# Patient Record
Sex: Male | Born: 1955
Health system: Southern US, Community
[De-identification: ages and names within clinical notes are randomized; demographics above are authoritative.]

## PROBLEM LIST (undated history)

## (undated) ENCOUNTER — Emergency Department (HOSPITAL_COMMUNITY): Disposition: A | Discharge status: Home / Self Care | Payer: Self-pay

## (undated) DIAGNOSIS — I10 Essential (primary) hypertension: Secondary | ICD-10-CM

## (undated) DIAGNOSIS — G8929 Other chronic pain: Secondary | ICD-10-CM

## (undated) DIAGNOSIS — I447 Left bundle-branch block, unspecified: Secondary | ICD-10-CM

## (undated) DIAGNOSIS — Z95810 Presence of automatic (implantable) cardiac defibrillator: Secondary | ICD-10-CM

## (undated) DIAGNOSIS — M1990 Unspecified osteoarthritis, unspecified site: Secondary | ICD-10-CM

## (undated) DIAGNOSIS — I2510 Atherosclerotic heart disease of native coronary artery without angina pectoris: Secondary | ICD-10-CM

## (undated) DIAGNOSIS — I219 Acute myocardial infarction, unspecified: Secondary | ICD-10-CM

## (undated) DIAGNOSIS — I8390 Asymptomatic varicose veins of unspecified lower extremity: Secondary | ICD-10-CM

## (undated) DIAGNOSIS — E7800 Pure hypercholesterolemia, unspecified: Secondary | ICD-10-CM

## (undated) DIAGNOSIS — Z8739 Personal history of other diseases of the musculoskeletal system and connective tissue: Secondary | ICD-10-CM

## (undated) DIAGNOSIS — J189 Pneumonia, unspecified organism: Secondary | ICD-10-CM

## (undated) DIAGNOSIS — Z955 Presence of coronary angioplasty implant and graft: Secondary | ICD-10-CM

## (undated) DIAGNOSIS — F419 Anxiety disorder, unspecified: Secondary | ICD-10-CM

## (undated) DIAGNOSIS — K219 Gastro-esophageal reflux disease without esophagitis: Secondary | ICD-10-CM

## (undated) DIAGNOSIS — I509 Heart failure, unspecified: Secondary | ICD-10-CM

## (undated) DIAGNOSIS — R7303 Prediabetes: Secondary | ICD-10-CM

## (undated) DIAGNOSIS — Z87442 Personal history of urinary calculi: Secondary | ICD-10-CM

## (undated) DIAGNOSIS — R0600 Dyspnea, unspecified: Secondary | ICD-10-CM

## (undated) HISTORY — DX: Anxiety disorder, unspecified: F41.9

## (undated) HISTORY — DX: Pure hypercholesterolemia, unspecified: E78.00

## (undated) HISTORY — DX: Acute myocardial infarction, unspecified: I21.9

## (undated) HISTORY — DX: Left bundle-branch block, unspecified: I44.7

## (undated) HISTORY — DX: Asymptomatic varicose veins of unspecified lower extremity: I83.90

## (undated) HISTORY — PX: BACK SURGERY: SHX140

## (undated) HISTORY — DX: Other chronic pain: G89.29

## (undated) HISTORY — DX: Presence of coronary angioplasty implant and graft: Z95.5

## (undated) HISTORY — DX: Essential (primary) hypertension: I10

## (undated) HISTORY — PX: CORONARY ANGIOPLASTY WITH STENT PLACEMENT: SHX49

## (undated) HISTORY — PX: CORONARY ANGIOPLASTY: SHX604

## (undated) HISTORY — DX: Unspecified osteoarthritis, unspecified site: M19.90

---

## 1991-01-06 DIAGNOSIS — I219 Acute myocardial infarction, unspecified: Secondary | ICD-10-CM

## 1991-01-06 HISTORY — DX: Acute myocardial infarction, unspecified: I21.9

## 2000-08-22 ENCOUNTER — Encounter: Payer: Self-pay | Admitting: Family Medicine

## 2000-08-22 ENCOUNTER — Ambulatory Visit (HOSPITAL_COMMUNITY): Admission: RE | Admit: 2000-08-22 | Discharge: 2000-08-22 | Payer: Self-pay | Admitting: *Deleted

## 2001-09-11 ENCOUNTER — Inpatient Hospital Stay (HOSPITAL_COMMUNITY): Admission: EM | Admit: 2001-09-11 | Discharge: 2001-09-13 | Payer: Self-pay | Admitting: Emergency Medicine

## 2001-09-11 ENCOUNTER — Encounter: Payer: Self-pay | Admitting: Emergency Medicine

## 2001-09-12 ENCOUNTER — Encounter: Payer: Self-pay | Admitting: Cardiology

## 2004-02-06 ENCOUNTER — Encounter: Admission: RE | Admit: 2004-02-06 | Discharge: 2004-02-06 | Payer: Self-pay | Admitting: Internal Medicine

## 2004-04-15 ENCOUNTER — Encounter: Admission: RE | Admit: 2004-04-15 | Discharge: 2004-04-15 | Payer: Self-pay | Admitting: Orthopedic Surgery

## 2004-04-30 ENCOUNTER — Encounter: Admission: RE | Admit: 2004-04-30 | Discharge: 2004-04-30 | Payer: Self-pay | Admitting: Orthopedic Surgery

## 2004-05-13 ENCOUNTER — Encounter: Admission: RE | Admit: 2004-05-13 | Discharge: 2004-05-13 | Payer: Self-pay | Admitting: Orthopedic Surgery

## 2006-12-17 ENCOUNTER — Ambulatory Visit (HOSPITAL_COMMUNITY): Admission: RE | Admit: 2006-12-17 | Discharge: 2006-12-17 | Payer: Self-pay | Admitting: Cardiology

## 2007-01-06 HISTORY — PX: CARDIAC CATHETERIZATION: SHX172

## 2007-04-01 ENCOUNTER — Observation Stay (HOSPITAL_COMMUNITY): Admission: EM | Admit: 2007-04-01 | Discharge: 2007-04-02 | Payer: Self-pay | Admitting: Emergency Medicine

## 2007-04-06 ENCOUNTER — Observation Stay (HOSPITAL_COMMUNITY): Admission: AD | Admit: 2007-04-06 | Discharge: 2007-04-07 | Payer: Self-pay | Admitting: Cardiology

## 2010-02-21 ENCOUNTER — Ambulatory Visit (INDEPENDENT_AMBULATORY_CARE_PROVIDER_SITE_OTHER): Payer: BC Managed Care – PPO | Admitting: Cardiology

## 2010-02-21 DIAGNOSIS — I2510 Atherosclerotic heart disease of native coronary artery without angina pectoris: Secondary | ICD-10-CM

## 2010-02-21 DIAGNOSIS — E7800 Pure hypercholesterolemia, unspecified: Secondary | ICD-10-CM

## 2010-02-21 DIAGNOSIS — I10 Essential (primary) hypertension: Secondary | ICD-10-CM

## 2010-03-04 ENCOUNTER — Telehealth (INDEPENDENT_AMBULATORY_CARE_PROVIDER_SITE_OTHER): Payer: Self-pay | Admitting: *Deleted

## 2010-03-05 ENCOUNTER — Ambulatory Visit (HOSPITAL_COMMUNITY): Payer: BC Managed Care – PPO | Attending: Cardiology

## 2010-03-05 ENCOUNTER — Encounter: Payer: Self-pay | Admitting: Cardiology

## 2010-03-05 DIAGNOSIS — Z09 Encounter for follow-up examination after completed treatment for conditions other than malignant neoplasm: Secondary | ICD-10-CM | POA: Insufficient documentation

## 2010-03-05 DIAGNOSIS — I2510 Atherosclerotic heart disease of native coronary artery without angina pectoris: Secondary | ICD-10-CM

## 2010-03-05 DIAGNOSIS — I200 Unstable angina: Secondary | ICD-10-CM | POA: Insufficient documentation

## 2010-03-05 DIAGNOSIS — I447 Left bundle-branch block, unspecified: Secondary | ICD-10-CM

## 2010-03-13 NOTE — Assessment & Plan Note (Signed)
Summary: Cardiology Nuclear Testing  Nuclear Med Background Indications for Stress Test: Evaluation for Ischemia, Stent Patency   History: Abnormal EKG, Angioplasty, Heart Catheterization, Myocardial Infarction, Myocardial Perfusion Study, Stents  History Comments: 1993 Angioplasty 1993-2009 MI  2003-2009 STENTS:RCA 2008 MPS Inf. wall ischemia EF50%  Symptoms: Palpitations    Nuclear Pre-Procedure Cardiac Risk Factors: Family History - CAD, History of Smoking, Hypertension, LBBB Caffeine/Decaff Intake: None NPO After: 7:30 AM Lungs: clear IV 0.9% NS with Angio Cath: 18g     IV Site: R Antecubital IV Started by: Stanton Kidney, EMT-P Chest Size (in) 44     Height (in): 75 Weight (lb): 218 BMI: 27.35  Nuclear Med Study 1 or 2 day study:  1 day     Stress Test Type:  Adenosine Reading MD:  Peter Swaziland, MD     Referring MD:  P.Jordan Resting Radionuclide:  Technetium 60m Tetrofosmin     Resting Radionuclide Dose:  10.7 mCi  Stress Radionuclide:  Technetium 33m Tetrofosmin     Stress Radionuclide Dose:  33.0 mCi   Stress Protocol  Max Systolic BP: 111 mm HgDose of Adenosine:  55.5 mg    Stress Test Technologist:  Milana Na, EMT-P     Nuclear Technologist:  Domenic Polite, CNMT  Rest Procedure  Myocardial perfusion imaging was performed at rest 45 minutes following the intravenous administration of Technetium 26m Tetrofosmin.  Stress Procedure  The patient received IV adenosine at 140 mcg/kg/min for 4 minutes. 2nd degree avb with infusion. There were no significant changes with infusion. Technetium 24m Tetrofosmin was injected at the 2 minute mark and quantitative spect images were obtained after a 45 minute delay.  QPS Raw Data Images:  Normal; no motion artifact; normal heart/lung ratio. Stress Images:  Basal to mid inferior perfusion defect Rest Images:  Basal to mid inferior perfusion defect, less marked than with stress Subtraction (SDS):  Partially reversible  basal to mid inferior perfusion defect.  Transient Ischemic Dilatation:  1.02  (Normal <1.22)  Lung/Heart Ratio:  .28  (Normal <0.45)  Quantitative Gated Spect Images QGS EDV:  203 ml QGS ESV:  113 ml QGS EF:  44 % QGS cine images:  Inferior hypokinesis   Overall Impression  Exercise Capacity: Adenosine study with no exercise. BP Response: Normal blood pressure response. Clinical Symptoms: Flushed, chest pain.  ECG Impression: NSR with LBBB Overall Impression: Partially reversible basal to mid inferior perfusion defect suggesting infarction with component of peri-infarction ischemia.  Overall Impression Comments: Mildly decreased LV systolic function with inferior hypokinesis.   Appended Document: Cardiology Nuclear Testing copy sent to Dr. Swaziland

## 2010-03-13 NOTE — Progress Notes (Signed)
Summary: Nuclear Pre-Procedure  Phone Note Outgoing Call   Call placed by: Milana Na, EMT-P,  March 04, 2010 3:42 PM Summary of Call: Reviewed information on [employer] Information Sheet (see scanned document for further details).  Spoke with patient.     Nuclear Med Background Indications for Stress Test: Evaluation for Ischemia, Stent Patency   History: Angioplasty, Heart Catheterization, Myocardial Infarction, Myocardial Perfusion Study, Stents  History Comments: 1993 Angioplasty 1993-2009 MI  2003-2009 STENTS:RCA 2008 MPS Inf. wall ischemia EF50%     Nuclear Pre-Procedure Cardiac Risk Factors: Family History - CAD, History of Smoking, Hypertension, LBBB  Nuclear Med Study Referring MD:  P.Jordan   b

## 2010-04-30 ENCOUNTER — Other Ambulatory Visit: Payer: Self-pay | Admitting: *Deleted

## 2010-04-30 DIAGNOSIS — Z79899 Other long term (current) drug therapy: Secondary | ICD-10-CM

## 2010-05-07 ENCOUNTER — Encounter: Payer: Self-pay | Admitting: Cardiology

## 2010-05-07 DIAGNOSIS — E7800 Pure hypercholesterolemia, unspecified: Secondary | ICD-10-CM | POA: Insufficient documentation

## 2010-05-07 DIAGNOSIS — I447 Left bundle-branch block, unspecified: Secondary | ICD-10-CM | POA: Insufficient documentation

## 2010-05-07 DIAGNOSIS — I10 Essential (primary) hypertension: Secondary | ICD-10-CM | POA: Insufficient documentation

## 2010-05-07 DIAGNOSIS — I214 Non-ST elevation (NSTEMI) myocardial infarction: Secondary | ICD-10-CM | POA: Insufficient documentation

## 2010-05-14 ENCOUNTER — Ambulatory Visit (INDEPENDENT_AMBULATORY_CARE_PROVIDER_SITE_OTHER): Payer: BC Managed Care – PPO | Admitting: Cardiology

## 2010-05-14 ENCOUNTER — Other Ambulatory Visit (INDEPENDENT_AMBULATORY_CARE_PROVIDER_SITE_OTHER): Payer: BC Managed Care – PPO | Admitting: *Deleted

## 2010-05-14 ENCOUNTER — Encounter: Payer: Self-pay | Admitting: Cardiology

## 2010-05-14 DIAGNOSIS — I10 Essential (primary) hypertension: Secondary | ICD-10-CM

## 2010-05-14 DIAGNOSIS — I2510 Atherosclerotic heart disease of native coronary artery without angina pectoris: Secondary | ICD-10-CM | POA: Insufficient documentation

## 2010-05-14 DIAGNOSIS — Z79899 Other long term (current) drug therapy: Secondary | ICD-10-CM

## 2010-05-14 DIAGNOSIS — E7800 Pure hypercholesterolemia, unspecified: Secondary | ICD-10-CM

## 2010-05-14 LAB — BASIC METABOLIC PANEL
BUN: 22 mg/dL (ref 6–23)
CO2: 28 mEq/L (ref 19–32)
Calcium: 9.6 mg/dL (ref 8.4–10.5)
Chloride: 103 mEq/L (ref 96–112)
Creatinine, Ser: 1.1 mg/dL (ref 0.4–1.5)
GFR: 74.37 mL/min (ref >60.00)
Glucose, Bld: 86 mg/dL (ref 70–99)
Potassium: 4.4 mEq/L (ref 3.5–5.1)
Sodium: 138 mEq/L (ref 135–145)

## 2010-05-14 NOTE — Patient Instructions (Signed)
Continue current medications.  Continue with diet and exercise.  We will call with results of your lab work today.  I will see you back in 6 months with fasting lab.

## 2010-05-14 NOTE — Assessment & Plan Note (Signed)
He remains asymptomatic. His most recent nuclear stress test in March showed a partially reversible basal to mid inferior wall defect consistent with infarction and peri-infarct ischemia. Ejection fraction was 44%. This is unchanged from 2008. We will continue to stress risk factor modification.

## 2010-05-14 NOTE — Assessment & Plan Note (Signed)
Blood pressure control has improved significantly. We will continue with his ACE inhibitor.

## 2010-05-14 NOTE — Progress Notes (Signed)
   [name] Date of Birth: 15-Oct-1955   History of Present Illness: [name] is seen today for followup evaluation. He has been doing much better with his diet and has lost 5 pounds. He reports his blood pressure readings at home have been good with systolic readings between 126 and 132. He has had no significant chest pain or shortness of breath.  Current Outpatient Prescriptions on File Prior to Visit  Medication Sig Dispense Refill  . amLODipine (NORVASC) 5 MG tablet Take 5 mg by mouth daily.        Marland Kitchen aspirin 81 MG tablet Take 81 mg by mouth daily.        . fenofibrate (TRICOR) 145 MG tablet Take 145 mg by mouth daily.        . Flaxseed, Linseed, (FLAX SEED OIL PO) Take by mouth 3 (three) times daily.        . Misc Natural Products (MELLOW-TONE PO) Take by mouth 3 (three) times daily. 2 TID       . Multiple Vitamin (MULTIVITAMIN) tablet Take 1 tablet by mouth 2 (two) times daily.        . ramipril (ALTACE) 2.5 MG tablet Take 2.5 mg by mouth daily.          Allergies  Allergen Reactions  . Ezetimibe   . Niacin   . Statins     Past Medical History  Diagnosis Date  . Hypertension   . Hypercholesterolemia   . LBBB (left bundle branch block)   . MI (myocardial infarction) 1993    LATERAL  . Varicose veins   . Arthritis     Past Surgical History  Procedure Date  . Coronary angioplasty     DIRECT ANGIOPLASTY THE MARGINAL BRANCH    History  Smoking status  . Former Smoker  . Quit date: 05/06/2005  Smokeless tobacco  . Not on file    History  Alcohol Use No    Family History  Problem Relation Age of Onset  . Heart attack Father   . Heart disease Brother     Review of Systems:   All other systems were reviewed and are negative.  Physical Exam: BP 130/84  Pulse 72  Ht 6\' 3"  (1.905 m)  Wt 217 lb 8 oz (98.657 kg)  BMI 27.19 kg/m2 He is a well-developed white male in no acute distress. He has no JVD or bruits. Lungs are clear. Cardiac exam reveals a  regular rate and rhythm without gallop, murmur, or click. Abdomen is soft and nontender. He has no masses or bruits. Femoral and pedal pulses are 2+. LABORATORY DATA:   Assessment / Plan:

## 2010-05-14 NOTE — Assessment & Plan Note (Signed)
His last lipid panel did show some improvement with an LDL of 115, triglycerides of 98, and HDL 42. He has been intolerant of statins, Zetia, and niacin. We will continue with fenofibrate. Repeat fasting lab work in 6 months.

## 2010-05-15 ENCOUNTER — Telehealth: Payer: Self-pay | Admitting: *Deleted

## 2010-05-15 NOTE — Telephone Encounter (Signed)
Message copied by Murrell Redden on Thu May 15, 2010  5:21 PM ------      Message from: [place], PETER      Created: Wed May 14, 2010  1:44 PM       BMET is normal. Please report.

## 2010-05-15 NOTE — Telephone Encounter (Signed)
Notified of lab results. 

## 2010-05-20 NOTE — Discharge Summary (Signed)
NAMEWAVERLY, [name]                 ACCOUNT NO.:  [id number]   MEDICAL RECORD NO.:  [id number]          PATIENT TYPE:  INP   LOCATION:  4729                         FACILITY:  [facility]   PHYSICIAN:  Colleen Can. Deborah Chalk, M.D.DATE OF BIRTH:  [birth year]   DATE OF ADMISSION:  04/06/2007  DATE OF DISCHARGE:  04/07/2007                               DISCHARGE SUMMARY   DISCHARGE DIAGNOSES:  1. Chest pain with subsequent elective cardiac catheterization      documenting stent patency to the right coronary artery with a 30%      proximal irregularity, distally the posterolateral branch has a      small 70-80% ostial narrowing; posterior descending has a 50%      ostial narrowing, and there is some haziness in the distal right      coronary, but overall it was felt to be patent.  The left main is      normal, the left anterior descending is normal, and the proximal      left anterior descending dips into the septum and then trifurcates.      The intermediate coronary is relatively a large bifurcating vessel      and it is normal.  The left circumflex is small with minor      irregularities.  Left ventricular function is normal.  2. Known ischemic heart disease with remote lateral myocardial      infarction in 1993, treated with direct angioplasty of the marginal      branch.  He has had stenting of the distal right coronary in 2003.      He had a non-Q-wave myocardial infarction 1 week ago and was found      to have a high-grade stenosis in the posterolateral branch of the      right coronary and an ulcerated plaque in the mid right coronary      with moderate stenosis, and at that time had extensive stenting of      the mid right with two 3.0 x 16 mm Taxus stents, and stenting of      the posterolateral branch with a 2.5 x 12 mm Taxus stent.  3. Hyperlipidemia.  4. Hypertension.  5. Venous varicosities.  6. Remote tobacco abuse.   HISTORY OF PRESENT ILLNESS:  [name] is a 55 year old white  male who  was referred for admission due to recurrent chest pain.  He had a non-Q-  wave myocardial infarction 1 week ago and had 6 extensive stenting of  the right coronary artery.  He was discharged the following day.  He  initially did well.  However, today, on the day of admission, he went to  see a chiropractor and had acupuncture on his back, and while lying  down, he developed acute substernal chest pain, it radiated to his upper  jaw.  This was associated with a clammy sensation.  He graded it as a 5  on a scale of 0-10.  This discomfort lasted for approximately 15 minutes  and then resolved and then subsequently recurred 15-20 minutes later.  Second episode lasted  approximately 20 minutes.  Since his discharge, he  had had a mild nagging discomfort in the left lateral chest.  His EKG  shows a left bundle-branch block.  He was subsequently seen in the  office, and because of recurrent anginal symptoms and recent extensive  stenting of the right coronary, he was admitted for stabilization and  repeat cardiac catheterization.   Please see the history and physical per Dr. Peter Swaziland for further  patient presentation and profile.   LABORATORY DATA:  On admission, his CBC was normal.  PT and PTT were  unremarkable.  BMET was normal.  Cardiac enzymes and CK-MBs were  negative.  Troponin was 1.24 to 0.72; however, these were felt to be  trending down from the recent event.  His EKG showed a left bundle-  branch block.   HOSPITAL COURSE:  The patient was admitted from the office.  He was  started on IV nitroglycerin and IV heparin.  We proceeded on with  cardiac catheterization that afternoon.  The procedure was tolerated  well without any known complications.  The findings are as noted above,  and overall in light of the findings, Mr. Blethen can be continued on  nitrate and managed medically.  His antiplatelet therapy will be  continued as well, and he will have close outpatient  followup.  He was  watched overnight, and on April 07, 2007, was deemed stable for  discharge.   CONDITION ON DISCHARGE:  Stable.   DISCHARGE DIET:  Low-salt, heart-healthy.   DISCHARGE MEDICATIONS:  1. Aspirin 81 mg a day.  2. Norvasc 5 mg a day.  3. Multivitamin daily.  4. Fish oil 1000 mg b.i.d.  5. Glucosamine twice a day.  6. Flax seed oil twice a day.  7. Tricor 145 mg a day.  8. Plavix 75 mg a day.  9. Nitroglycerin p.r.n.  10.Pepcid 40 mg at bedtime.  11.Nitro-Dur patch 0.4 mg to be placed every evening at bedtime and      removed the following day at dinner time.   He is to see Dr. Peter Swaziland in 1 week, certainly sooner if any  problems arise in the interim.      [name], N.P.      Colleen Can. Deborah Chalk, M.D.  Electronically Signed    LC/MEDQ  D:  04/07/2007  T:  04/08/2007  Job:  [id number]   cc:   Peter M. [place], M.D.

## 2010-05-20 NOTE — H&P (Signed)
NAMEJOSEMARIA, BRINING NO.:  [id number]   MEDICAL RECORD NO.:  [id number]          PATIENT TYPE:  INP   LOCATION:  4729                         FACILITY:  [facility]   PHYSICIAN:  Peter M. Swaziland, M.D.  DATE OF BIRTH:  04/01/1955   DATE OF ADMISSION:  04/06/2007  DATE OF DISCHARGE:                              HISTORY & PHYSICAL   Mr. Isbell is a 55 year old white male with known history of coronary  artery disease.  He had a remote lateral myocardial infarction in 1993  treated with direct angioplasty of the marginal branch.  He had stenting  of the distal right coronary artery in 2003.  He presented last week  with a non-Q-wave myocardial infarction.  He was found to have a high-  grade stenosis in the posterolateral branch of the right coronary and an  ulcerated plaque in the mid-right coronary with moderate stenosis.  He  had extensive stenting of the mid-right coronary with two 3.0 x 16-mm  Taxus stents and stenting of the posterolateral branch with a 2.5 x 12-  mm Taxus stent.  He was discharged on Saturday.  This morning he went to  see is chiropractor and had acupuncture on his back.  On lying down he  developed acute substernal chest pain radiating to his upper jaw  associated with a clammy sensation and he rated this as a 5/10.  Symptoms last 15 minutes and then abated and then about 15-20 minutes  later it recurred, again with similar symptoms and this time lasted  about 20 minutes.  He states since discharge he has had a mild nagging  pain in his left lateral chest.  The patient's ECG shows a left bundle  branch block.  Because of his recurrent anginal symptoms and recent  extensive stenting of the right coronary artery, the patient is admitted  for stabilization and repeat cardiac catheterization.   PAST MEDICAL HISTORY:  1. Hypercholesterolemia.  2. Hypertension.  3. History of venous varicosities.  4. Remote history of tobacco use.  5. History of  coronary disease as noted above.   CURRENT MEDICATIONS:  1. Aspirin 81 mg per day.  2. Norvasc 5 mg per day.  3. Multivitamin daily.  4. Fish oil 1000 mg b.i.d.  5. Glucosamine twice a day.  6. Flaxseed old twice a day.  7. Tricor 145 mg per day.  8. Plavix 75 mg per day.   ALLERGIES:  The patient is intolerant to statins Niaspan and Zetia.   His family and social history are unchanged from recent H&P.   REVIEW OF SYSTEMS:  Otherwise unremarkable.   PHYSICAL EXAMINATION:  The patient is a well-developed white male in no  apparent distress.  Weight is 238.  Blood pressure is 150/90, pulse 80 and regular.  HEENT:  Unremarkable.  He has no JVD or bruits.  LUNGS:  Clear.  CARDIAC:  Without gallop, murmur, rub or click.  ABDOMEN:  Soft, nontender.  He has good pedal pulses.  There is no groin  hematoma.  He has no edema or phlebitis.  NEUROLOGIC:  Intact.  His ECG shows normal sinus rhythm with a left bundle branch block, which  is unchanged   IMPRESSION:  1. Recurrent chest pain consistent with unstable angina.  2. Recent non-Q-wave myocardial infarction with subsequent stenting of      the right coronary in the posterolateral branch.  3. Hypertension.  4. Hypercholesterolemia.   PLAN:  The patient will be admitted.  He will be started on IV heparin  and nitroglycerin and will undergo repeat cardiac catheterization later  today.           ______________________________  Peter M. Sw[place]iland, M.D.     PMJ/MEDQ  D:  04/06/2007  T:  04/06/2007  Job:  [id number]

## 2010-05-20 NOTE — Cardiovascular Report (Signed)
NAMEJESPER, [name] NO.:  [id number]   MEDICAL RECORD NO.:  [id number]          PATIENT TYPE:  INP   LOCATION:  3715                         FACILITY:  [facility]   PHYSICIAN:  Peter M. Swaziland, M.D.  DATE OF BIRTH:  12-Jan-1955   DATE OF PROCEDURE:  04/01/2007  DATE OF DISCHARGE:  04/02/2007                            CARDIAC CATHETERIZATION   INDICATION FOR PROCEDURE:  The patient is a 55 year old white male with  history of coronary artery disease.  He has had remote angioplasty of a  marginal branch in 1993.  He subsequently had stenting of the distal  right coronary artery in 2003.  He had a cardiac catheterization  December 2008 which showed diffuse nonobstructive coronary disease.  He  now presents with non-Q-wave myocardial infarction.   PROCEDURE:  Left heart catheterization, coronary and left ventricular  angiography, intracoronary stenting of the mid right coronary and the  posterolateral branch.  Access is via the right femoral artery using the  standard Seldinger technique.  Postprocedure his groin was closed using  Angio-Seal device with excellent hemostasis.   EQUIPMENT USED:  6-French 4 cm right and left Judkins catheter, 6-French  pigtail catheter, 6-French arterial sheath, 6-French FR-4 with side  holes 0.0 and 4, Luge wire, a 2.5 x 12-mm Maverick balloon, a 2.5 x 12  mm Taxus Liberte stent, a 2.75 x 8-mm Quantum Maverick balloon, a 3.0 x  16 mm Taxus Liberte stents x2, and a 3.0 x 20-mm Quantum Maverick  balloon   MEDICATIONS:  1. Local anesthesia with 1% lidocaine.  2. 75 mcg of IV fentanyl.  3. 4 mg of IV Versed.  4. Nitroglycerin 200 mcg intracoronary x1.  5. Angiomax bolus and continuous infusion per pharmacy.  ACT was 331      seconds.  6. Contrast 225 mL of Omnipaque.   HEMODYNAMIC DATA:  Aortic pressure is 109/68 with a mean of 85 mmHg.  Left ventricle pressure is 109 with EDP of 12 mmHg.   ANGIOGRAPHIC DATA:  Left coronary arises  and distributes normally.  The  left main coronary is mildly calcified but otherwise appears normal.   The left anterior descending artery has a tortuous segment in the  proximal vessel where it dips down and appears to go intramyocardial.  This segment is diffusely diseased up to 40% at the trifurcation of the  1st diagonal branch and the 1st septal perforator branch.  The LAD is  otherwise normal.   The left circumflex coronary artery demonstrates a 20% narrowing in the  1st obtuse marginal vessel.  Otherwise, there is no significant disease  in the circumflex system.   The right coronary artery arises and distributes normally.  It is a  dominant vessel.  There is a segment of 40-50% stenosis in the mid  vessel followed by an ulcerated plaque of 70% stenosis in the midvessel.  The distal vessel's site of previous stent is still widely patent.  There is a 95% stenosis in the proximal posterolateral branch.   Left ventricular angiography performed in the RAO view demonstrates  normal left ventricular  size.  There is inferior wall hypokinesia with  overall mild left ventricular systolic dysfunction.  Ejection fraction  is estimated at 50%.  There is no significant mitral insufficiency.   We proceeded at this point with intervention of the right coronary  artery.  We initially tried to cross lesions with an Asahi medium wire,  but this proved to be very difficult, and we were unable to cross past  the mid right coronary artery.  We switched to a Luge wire which did  give [place] access distally past the posterolateral branch lesion.  We  initially predilated the posterolateral branch lesion using 2.5 mm  Maverick balloon up to 6 atmospheres.  We then predilated the mid right  coronary artery up to 6 atmospheres as well.  We then stented the  posterolateral branch using a 2.5 x 12 mm Taxus Liberte stent, deploying  this at 6 and then 12 atmospheres.  We then postdilated this stent using  a  2.75 x 8-mm Quantum Maverick balloon, dilating to 14 atmospheres x2.  The mid right coronary was then addressed.  We used a 3.0 x 60 mm Taxus  Liberte stent to stent the area of ulceration. The stent was deployed in  overlapping fashion with the original stent distally.  It was deployed  at 8 and then 12 atmospheres with a stent balloon.  At this point, it  was clear that the more proximal lesion in the mid right coronary artery  had worsened, probably due to injury from the wire or balloon passage.  It was now at least 80% stenosed.  We stented this using an additional  3.0 x 16 mm Taxus Liberte stent, again deploying in 8 and then 12  atmospheres.  The entire mid right coronary was then postdilated using a  3.0 x 20-mm Quantum Maverick balloon doing 2 inflations up to 16  atmospheres to cover the entire stented segment.  This yielded an  excellent angiographic result with 0% residual stenosis in the mid right  coronary artery and in the posterolateral branch with TIMI grade 3 flow.  The patient tolerated the procedure well without complications.   FINAL INTERPRETATION:  1. Single-vessel obstructive atherosclerotic coronary artery disease.  2. Mild left ventricular dysfunction.  3. Successful intracoronary stenting of the mid and right coronary      artery and in the posterolateral branch of the right coronary      artery.           ______________________________  Peter M. [place], M.D.     PMJ/MEDQ  D:  04/01/2007  T:  04/02/2007  Job:  [id number]   cc:   Antony Madura, M.D.

## 2010-05-20 NOTE — H&P (Signed)
[name], [name] NO.:  [id number]   MEDICAL RECORD NO.:  [id number]           PATIENT TYPE:   LOCATION:                                 FACILITY:   PHYSICIAN:  Peter M. Swaziland, M.D.       DATE OF BIRTH:   DATE OF ADMISSION:  12/17/2006  DATE OF DISCHARGE:                              HISTORY & PHYSICAL   HISTORY OF PRESENT ILLNESS:  [name] is a 55 year old white male with  known history of coronary artery disease.  He had a remote lateral  myocardial infarction in 1993 and underwent angioplasty of a marginal  branch at that time. He returned in 2003 with recurrent unstable angina  and that time was found to have a complex lesion in the distal right  coronary.  This was successfully stented using a 3.0 x 16 mm Express  stent.  The patient has done well since then until recently he noticed  some atypical chest pain described as a sharp transient pain in his left  parasternal region. For this reason, he underwent an adenosine  Cardiolite study which demonstrated evidence of partially reversible  inferior wall defect consistent with ischemia.  His ejection fraction  was 50%. Based on these findings, we have recommended repeat coronary  angiography.   PAST MEDICAL HISTORY:  Is significant for:  1. Hypercholesterolemia.  2. Hypertension.  3. He has a history of venous varicosities.  4. He has prior history of tobacco abuse.   ALLERGIES:  He has been intolerant to STATINS  and to [facility] as well  as ZETIA.   CURRENT MEDICATIONS:  1. Aspirin 81 mg per day.  2. Norvasc 5 mg daily.  3. Multivitamin daily.  4. Fish old 1000 mg b.i.d.  5. Glucosamine twice a day.  6. Flaxseed old twice a day.  7. Tricor 145 mg per day.   SOCIAL HISTORY:  The patient quit smoking over 5 years ago.  He is  married and has three children.   FAMILY HISTORY:  Father died in his 27s of myocardial infarction.   REVIEW OF SYSTEMS:  Is otherwise unremarkable.   PHYSICAL  EXAMINATION:  GENERAL:  The patient is a pleasant white male in  no apparent distress.  VITAL SIGNS:  Weight 259. Blood pressure is 130/90, pulse 80 and  regular. Respirations are normal.  HEENT:  He is normocephalic, atraumatic.  Pupils are equal, round,  reactive to light and accommodation.  Extraocular movements are full.  Oropharynx is clear.  NECK:  Supple without JVD, adenopathy, thyromegaly or bruits.  LUNGS:  Clear to auscultation and percussion.  CARDIAC:  Exam reveals a regular rate and rhythm without gallop, murmur,  rub or click.  ABDOMEN:  Soft and nontender. He has no masses or bruits.  EXTREMITIES:  Femoral and pedal pulses are 2+ and symmetric.  NEUROLOGIC:  Exam is nonfocal.   LABORATORY DATA:  His ECG at rest shows normal sinus rhythm, left bundle  branch block.   His chest x-ray shows no active disease.   IMPRESSION:  1. Coronary disease with  remote lateral myocardial infarction, now      with an abnormal adenosine Cardiolite study showing evidence of      inferior wall ischemia.  2. Prior angioplasty of the first obtuse marginal vessel in 1993 and      prior stenting of the right coronary artery with an Express stent      in 2003  3. Hypercholesterolemia.  4. Hypertension.   PLAN:  Will proceed with diagnostic cardiac catheterization with further  therapy pending these results.           ______________________________  Peter M. [place], M.D.     PMJ/MEDQ  D:  12/13/2006  T:  12/13/2006  Job:  [id number]   cc:   Antony Madura, M.D.

## 2010-05-20 NOTE — Cardiovascular Report (Signed)
[name], [name] NO.:  [id number]   MEDICAL RECORD NO.:  [id number]          PATIENT TYPE:  OIB   LOCATION:  2899                         FACILITY:  [facility]   PHYSICIAN:  [name], M.D.  DATE OF BIRTH:  25-May-1955   DATE OF PROCEDURE:  DATE OF DISCHARGE:                            CARDIAC CATHETERIZATION   INDICATION FOR PROCEDURE:  The patient is a 55 year old white male with  history of coronary disease status post prior stenting of the distal  right coronary artery in 2003, who presents with atypical chest pain.  Cardiolite study is suggestive of some inferior wall ischemia.   PROCEDURES:  Left heart catheterization, coronary left ventricular  angiography.  Equipment used  6-French 4 cm right and left Judkins catheter, 6-French pigtail  catheter, 6-French arterial sheath.   MEDICATIONS:  Local anesthesia 1% Xylocaine; Versed 2 mg IV; fentanyl 25  mcg IV; contrast 110 cc of Omnipaque.   HEMODYNAMIC DATA:  Aortic pressure 120/62 with a mean of 87 mmHg.  Left  ventricular pressure was 120 with EDP of 5 mmHg.   ANGIOGRAPHIC DATA:  The left coronary artery arises and distributes  normally.  Left main coronary is calcified without obstructive disease.   The left anterior descending is also moderately calcified proximally.  There is 10-20% narrowing in the proximal vessel.   The left circumflex coronary artery gives rise to a large first obtuse  marginal vessel.  There is 20% narrowing in the proximal obtuse marginal  vessel.   The right coronary artery is a dominant vessel.  It has a 50% stenosis  in the midvessel.  There is 30% stenosis at the crux.  The distal vessel  at the site of previous stent is widely patent.   Left ventricular angiography was performed in the RAO view.  This  demonstrates normal left ventricular size and function.  Overall  ejection fraction of 55%.  No wall motion abnormalities were seen.   FINAL INTERPRETATION:  1.  Nonobstructive atherosclerotic coronary artery disease.  2. Normal left ventricular function.  3. Continued patency of the stent in the distal right coronary.   PLAN:  Would recommend continued medical management.   ADDENDUM:  The patient's right groin was closed using Angio-Seal device  with excellent hemostasis.           ______________________________  [name], M.D.     PMJ/MEDQ  D:  12/17/2006  T:  12/18/2006  Job:  [id number]   cc:   [name], M.D.

## 2010-05-20 NOTE — Cardiovascular Report (Signed)
NAMEPADRAIG, [name]                 ACCOUNT NO.:  [id number]   MEDICAL RECORD NO.:  [id number]          PATIENT TYPE:  INP   LOCATION:  4729                         FACILITY:  [facility]   PHYSICIAN:  Colleen Can. Deborah Chalk, M.D.DATE OF BIRTH:  [birth year]   DATE OF PROCEDURE:  04/06/2007  DATE OF DISCHARGE:                            CARDIAC CATHETERIZATION   PROCEDURE:  Left heart catheterization with selective coronary  angiography, left ventricular angiography.   TYPE AND SITE OF ENTRY:  Percutaneous right femoral artery with Angio-  Seal.   CATHETERS:  6-French four curved Judkins right and left coronary  catheter, 6-French pigtail ventriculographic catheter.   CONTRAST:  Pure Omnipaque.   MEDICATIONS GIVEN PRIOR TO PROCEDURE:  Valium 10 mg p.o.   MEDICATIONS GIVEN DURING PROCEDURE:  Versed 2 mg IV.   COMMENTS:  The patient tolerated the procedure well.  Ancef was given  because of the Angio-Seal procedure.   HEMODYNAMIC DATA:  The aortic pressure was 116/72, LV was all 103/1-3.  There was no aortic valve gradient noted on pullback.   ANGIOGRAPHIC FINDINGS:  Left ventricular angiogram was performed in the  RAO position.  Overall cardiac size was borderline increased.  The  global ejection fraction was estimated in the 50-55% range.  The  inferior wall moved well.  There may have been some slight hypokinesis  in part related to left bundle branch block.   CORONARY ARTERIES:  The coronary arteries arise and distribute normally.   1. Right coronary artery.  The right coronary artery is very large.      All the stents that were placed in March of this year were patent.      There was a 30% proximal irregularity.  Distally the posterolateral      branch was small had a 70-80% ostial narrowing.  The posterior      descending had a 50% ostial narrowing.  There is some haziness in      the distal right coronary artery but overall was felt to be patent.  2. Left main coronary artery is  normal.  3. Left anterior descending is normal.  The proximal left anterior      descending dips into the septum and then trifurcates.  4. Intermediate coronary is relatively large bifurcating vessel.  It      is normal.  5. Left circumflex is small with minor irregularities.   OVERALL IMPRESSION:  1. Essentially normal global left ventricular function.  2. Patent stents in the right coronary artery with moderate disease in      small vessels.  3. Minimal coronary atherosclerosis in the left coronary system.   DISCUSSION:  In light of these findings, we will continue [name] on  nitrates and manage him medically.  We will continue his antiplatelet  therapy.      Colleen Can. Deborah Chalk, M.D.  Electronically Signed     SNT/MEDQ  D:  04/06/2007  T:  04/07/2007  Job:  [id number]

## 2010-05-23 NOTE — H&P (Signed)
NAMEDONALDSON, RICHTER NO.:  [id number]   MEDICAL RECORD NO.:  [id number]                   PATIENT TYPE:  INP   LOCATION:  6522                                 FACILITY:  [facility]   PHYSICIAN:  Francisca December, M.D.               DATE OF BIRTH:  28-Dec-1955   DATE OF ADMISSION:  09/11/2001  DATE OF DISCHARGE:                                HISTORY & PHYSICAL   REASON FOR ADMISSION:  Chest pain.   HISTORY OF PRESENT ILLNESS:  The patient is a 55 year old man who developed  mild brief left anterior chest pain that recurred intermittently yesterday  morning.  It resolved later in the day.  This evening around 1800 hours it  recurred.  He had had three vodka cocktails.  This time it was more severe.  It would come and go and was associated with marked diaphoresis.  There was  some shortness of breath as well.  The pain radiated to the inside of his  left arm.  It finally resolved after IV nitroglycerin was administered in  the emergency room at around 2000 hours.  At the time of my evaluation at  2200 hours he feels a little bit better but somewhat sore.   He has a history of coronary disease status post an angioplasty in 1993 for  an acute lateral wall myocardial infarction.  Presumably a left circumflex  coronary artery.  He also had a presentation for chest pain in 1997 and  underwent cardiac catheterization which showed no significant obstructive  disease.   PAST MEDICAL HISTORY:  Other than mentioned above is negative.   PAST SURGICAL HISTORY:  Negative.   CURRENT MEDICATIONS:  Aspirin 325 mg daily and vitamins.   ALLERGIES:  None known.   FAMILY HISTORY:  Markedly positive for coronary disease.  He has a younger  brother who has had bypass surgery and his father died at age 37 of  myocardial infarction.   SOCIAL HISTORY:  He uses ethanol only occasionally by his report.  He smokes  two to three packs of cigarettes a day.  He works in [employer]  and is accompanied by his wife and son here in the emergency room tonight.   REVIEW OF SYSTEMS:  Negative for visual changes or headache.  No dysphagia.  He has a chronic productive cough in the morning.  No hemoptysis.  No  abdominal pain, lower extremity claudication, hematemesis, hematochezia, or  melena.  He has no difficulty with urinating.  No joint pain, no muscle  weakness.  He has not had a stroke.  He has no difficulty with sleep and he  denies any history of emotional disorders.   PHYSICAL EXAMINATION:  VITAL SIGNS: The blood pressure is 138/82, pulse is  88 and regular, respiratory rate 16, temperature 98.6, room air O2  saturation to 97%.  GENERAL: The patient is  a 55 year old man who is alert and oriented x3.  He  does have the odor of ethanol on his breath.  He is in no distress.  HEENT: Unremarkable.  Head is atraumatic and normocephalic.  Pupils are  equal, round, and reactive to light and accomodation.  Extraocular movements  are intact.  Oral mucosa is pink and moist.  The tongue is not coated.  The  sclerae are anicteric.  NECK: Supple without thyromegaly or masses.  The carotid upstrokes are  normal.  There is no bruit.  There is no jugular venous distention.  CHEST: Clear with adequate excursion; no wheezes, rales, or rhonchi.  HEART: Regular rhythm, normal S1 and S2 is heard; no S3, S4, murmur, click,  or rub noted.  ABDOMEN: Soft, flat, nontender; no hepatosplenomegaly or midline pulsatile  mass.  GENITALIA: Normal male phallus, descended testicles; no lesion.  RECTAL: Not performed.  EXTREMITIES: Full range of motion; no edema.  Intact distal pulses.  NEUROLOGICAL: Cranial nerves II-XII are intact.  Motor and sensory grossly  intact.  Gait not tested.  SKIN: Warm, dry, and clear.   DIAGNOSTIC STUDIES:  Electrocardiogram showed slight inferior ST segment  depression done by the EMS at 1852 hours.  This had largely resolved on  followup  tracing 142 with chest pain resolved.   Chest x-ray shows no active cardiopulmonary disease.   Admission hemogram is normal as are serum electrolytes, BUN, creatinine, and  glucose.  Alcohol level was 152.  Troponin is 0.02, CK is 265, with an MB of  2.8 and a relative index of 1.1.   IMPRESSION:  1. Unstable angina pectoris and history of coronary artery disease in a 55-     year-old man.  Symptoms are somewhat atypical but ECG was very     concerning.  2. Ongoing tobacco abuse.  3. Ethanol usage.  4. Poor risk factor modification.   PLAN:  We will admit for rule out myocardial infarction as an inpatient.  Serial CK and repeat EKG planned.  Will receive subcutaneous Lovenox.  Has  received aspirin.  We will begin beta blocker and make n.p.o. after midnight  for possible cardiac catheterization or other further evaluation per Dr.  Peter Swaziland.  Finally, the patient is strongly advised to discontinue  smoking.                                               Francisca December, M.D.    JHE/MEDQ  D:  09/11/2001  T:  09/12/2001  Job:  [id number]   cc:   Peter M. Swaziland, M.D.  [address]  [place], [place] [address]  Fax: [phone]

## 2010-05-23 NOTE — Cardiovascular Report (Signed)
NAME:  [name], [name] NO.:  [id number]   MEDICAL RECORD NO.:  [id number]                   PATIENT TYPE:  INP   LOCATION:  6599                                 FACILITY:  [facility]   PHYSICIAN:  Peter M. Swaziland, M.D.               DATE OF BIRTH:  [birth year]   DATE OF PROCEDURE:  09/12/2001  DATE OF DISCHARGE:                              CARDIAC CATHETERIZATION   INDICATIONS FOR PROCEDURE:  The patient is a 55 year old white male with a  history of tobacco abuse and family history of coronary disease. He is  status post lateral myocardial infarction in 1993 with angioplasty of the  marginal branch. He now presents with recurrent unstable angina.   ACCESS:  Via the right femoral artery using the standard Seldinger  technique.   EQUIPMENT:  The 6 French 4 cm right and left Judkins catheter, 6 French  pigtail catheter, 6 French arterial sheath, 7 French arterial sheath, 7  French right Judkins 4 guide, 0.014 Hi-Torque Floppy wire, a 2.5/15 mm  Maverick balloon, a 3.0 x 16 mm Express II stent.   CONTRAST:  Omnipaque 245 cc.   MEDICATIONS:  The patient is on a nitroglycerin drip at 10 mcg/min.  He is  given additional 200 mcg intracoronary bolus and nitroglycerin x3, Plavix  300 mg p.o., Integrilin double bolus at 0.18 mg/kg followed by continuous IV  infusion and 2 mcg/kg per minute.  Heparin a total of 5500 units IV, a  subsequent ACT of 277.  Versed 1 mg IV.   HEMODYNAMIC DATA:  Aortic pressure is 132/85 with a mean of 104.  Left  ventricular  pressure is 133 with an EDP of 9 mmHg.   ANGIOGRAPHIC DATA:  Left coronary artery:  The left coronary artery arises  and distributes normally.   Left main:  The left main coronary is very short and without significant  disease.   Left anterior descending:  The left anterior descending artery has an acute  angulation in the proximal vessel at the first septal perforator.  This  segment appears to have 30%  narrowing. There is also 30% disease at the  ostium of the first diagonal branch.   Left circumflex:  The left circumflex coronary artery has less than 10% wall  irregularities.   Right coronary artery:  The right coronary artery is a large dominant  vessel. It has diffuse 20-30% disease in the mid vessel.  The distal vessel  demonstrates a 95% severely ulcerated stenosis prior to the takeoff of the  PDA.   LEFT VENTRICULAR ANGIOGRAPHY:  The left ventricular angiography performed in  the RAO view demonstrates normal left ventricular size with minimal distal  inferior hypokinesia.  Overall, left ventricular systolic function is well  preserved with ejection fraction estimated at 55%.   We proceeded at this point with stenting at the right coronary artery. The  patient was  appropriately medication and this lesion was crossed easily with  the wire. We initially pre-dilated this lesion using a 2.5 mm balloon up to  10 atmospheres. We then placed the 3.0 x 16 mm Express stent and deployed  this at 9 atmospheres. We followed this with another inflation at 12  atmospheres. This yielded excellent stent expansion with an excellent  angiographic result, less than 0% residual stenosis. There was no compromise  of the PDA or posterolateral branches.   FINAL INTERPRETATION:  1. Single-vessel obstructive atherosclerotic coronary artery disease.  2. Good left ventricular function.  3. Successful stenting of the distal right coronary artery.                                                  Peter M. Swaziland, M.D.    PMJ/MEDQ  D:  09/12/2001  T:  09/13/2001  Job:  [id number]

## 2010-05-23 NOTE — Discharge Summary (Signed)
NAME:  [name], ARREGUIN NO.:  [id number]   MEDICAL RECORD NO.:  [id number]                   PATIENT TYPE:  INP   LOCATION:  6522                                 FACILITY:  [facility]   PHYSICIAN:  Peter M. Swaziland, M.D.               DATE OF BIRTH:  1955/03/12   DATE OF ADMISSION:  09/11/2001  DATE OF DISCHARGE:  09/13/2001                                 DISCHARGE SUMMARY   HISTORY OF PRESENT ILLNESS:  The patient is a 55 year old white male who  presented with symptoms of chest pain.  Initially this was brief chest pain  in left anterior chest.  It recurred on the day of admission, became more  severe, still had a waxing and waning quality, and was associated with  diaphoresis and radiation to the inside of his left arm.  It did resolve  with IV nitroglycerin in the emergency room.  He had three vodka drinks the  day of admission.  He still had some persistent chest soreness even after  his initial pain resolved.  The patient had suffered a previous lateral  myocardial infarction in 1993 and underwent angioplasty of the left  circumflex coronary artery at that time.  He had a cardiac catheterization  in 1997b which showed no significant disease.  The patient has family  history of heart disease and has continued tobacco abuse of at least two  packs per day.   For details of this Past Medical History. Social History, Family Histor,  Physical Exam, please see admission History and Physical.   LABORATORY DATA:  Initial ECG done in the ambulance demonstrated mild ST  depression inferiorly which subsequently resolved.   Chest x-ray showed no active cardiopulmonary disease.   CMET was normal.  Coags were normal. White count was 8000, hemoglobin 14.9,  hematocrit 43.0, platelet count 204,000.  Troponin was 0.02. Alcohol level  152.  CK 265 with 2.8 MB.  Lipid panel showed cholesterol 199, triglycerides  116, HDL 50, and LDL 126.   HOSPITAL COURSE:  The  patient was admitted to telemetry.  He was continued  on IV nitroglycerin, started on subcutaneous Lovenox.  He was maintained on  aspirin and a beta blocker.  Subsequent cardiac enzymes were all negative,  but he did have persistent chest soreness.  On 09/12/2001, the patient  underwent cardiac catheterization.  This demonstrated single vessel  obstructive coronary disease with a high-grade ulcerated plaque in the  distal right coronary artery up to 95%.  This was successfully stented using  a 3.0 x 16 mm Express 2 stent.  An excellent result was obtained with less  than 0% residual stenosis.  The patient had resolution of his chest pain.  He was maintained on IV nitroglycerin and Integrilin overnight, and then  these were discontinued.  His ECG remained stable, and his CPKs remained  negative.   He was discharged  home on 09/13/2001 in stable condition.   DISCHARGE DIAGNOSES:  1. Unstable angina pectoris.  2. Remote lateral myocardial infarction.  3. Tobacco abuse.  4. Hypercholesterolemia.   DISCHARGE MEDICATIONS:  1. Aspirin 81 mg per day.  2. Plavix 75 mg per day.  3. Toprol XL 50 mg per day.  4. Pravachol 40 mg per day.  5. Nitroglycerin p.r.n.   ACTIVITY:  The patient is to avoid heavy lifting or straining for five days.   DIET:  Recommend a low-fat diet.   SPECIAL INSTRUCTIONS:  The patient was encouraged to stop smoking.  We  discussed smoking cessation strategies.   FOLLOW UP:  He will follow up with Dr. Swaziland in one week.                                               Peter M. Swaziland, M.D.    PMJ/MEDQ  D:  09/13/2001  T:  09/14/2001  Job:  [id number]

## 2010-05-23 NOTE — Discharge Summary (Signed)
[name], [name] NO.:  [id number]   MEDICAL RECORD NO.:  [id number]          PATIENT TYPE:  OBV   LOCATION:  3715                         FACILITY:  [facility]   PHYSICIAN:  [name], M.D.  DATE OF BIRTH:  Dec 07, 1955   DATE OF ADMISSION:  04/01/2007  DATE OF DISCHARGE:  04/02/2007                               DISCHARGE SUMMARY   HISTORY OF PRESENT ILLNESS:  [name] is a 55 year old white male with  known history of coronary artery disease.  He has had remote angioplasty  of obtuse marginal vessel in the setting of  lateral wall myocardial  infarction in 1993.  In 2003, presented with unstable angina and found  to have severe disease in the distal right coronary artery, which was  successfully stented.  The patient had had some atypical chest pain with  an abnormal Cardiolite study in December 2008.  He underwent cardiac  catheterization at that time, which showed nonobstructive coronary  disease, it was managed medically.  The patient presented with 2-week  history of increased substernal chest pain and shortness of breath with  diaphoresis.  He woke the morning of admission with recurrent chest pain  that waxed and waned in intensity.  On arrival, his ECG showed left  bundle-branch block.  His initial set of cardiac enzymes were negative  and subsequent enzymes were abnormal, and he was admitted for further  evaluation.   For details of his past medical history, social history, family history,  and physical exam please see admission history and physical.   LABORATORY DATA:  ECG showed normal sinus rhythm with a left bundle-  branch block pattern.  This was new compared to September 2008.  The  white count 5300, hemoglobin 14.4, and hematocrit 42.1.  The initial  point of care cardiac markers were negative.  Subsequent troponin was  elevated at 0.07.   Chest x ray showed cardiomegaly with mild peribronchial thickening and  bibasilar atelectasis.  The  chemistry panel was not available on the  chart at the time of dictation.   HOSPITAL COURSE:  The patient was admitted.  He was begun on IV heparin  and nitroglycerin.  He underwent cardiac catheterization the same day of  admission.  This demonstrated 40% disease in the proximal LAD at the  site of the first diagonal, first septal perforator though circumflex  coronary had minor nonobstructive disease.  The right coronary  demonstrated 70% ulcerated lesion in the midvessel, and then there was a  95% stenosis in the proximal posterolateral branch.  The previous stent  in the distal right coronary was widely patent.  His left ventricular  function was normal with ejection fraction of 50% with inferior wall  hypokinesia.  The patient underwent stenting of the 2 lesions in the  right coronary distribution.  The posterolateral branch was stented with  a 2.5 x 12-mm TAXUS Liberte stent.  The midvessel lesion was stented  using a 3.0 x 16-mm TAXUS Liberte stent.  The posterolateral lesion was  postdilated up to 2.75 mm.  The mid vessel lesion was  dilated up to 3  mm.  He had an excellent angiographic result in both lesions with TIMI  grade III flow.  The patient was treated during the procedure with  Angiomax, which was then discontinued.  His groin was sealed with an  Angio-Seal device.  He was maintained on aspirin and Plavix.  The  patient did subsequently have a 5B run of ventricular tachycardia.  He  was asymptomatic with this.  He had no subsequent chest pain.  His CPK  went from 298 to 706 with MB going from 41.1 to 118.9.  Troponin  increased from 0.84 to 12.31.  The patient had no groin complications.  His ECG continue to show left bundle-branch block, which was unchanged.  The patient clinically was asymptomatic at this point and was  recommended given his elevation of cardiac enzymes and the fact that he  had nonsustained ventricular tachycardia that he will be observed  another day  in the hospital.  However, the patient refused and insisted  on being discharged on his second hospital day.   DISCHARGE DIAGNOSES:  1. Non-Q-wave myocardial infarction.  2. Left bundle-branch block.  3. Successful stenting of the mid right coronary artery in the      posterolateral branch.  4. Hyperlipidemia.  The patient has been intolerant to multiple lipid-      lowering therapies including statins, Niaspan, and Zetia.  5. Hypertension.  6. History of venous varicosities.   DISCHARGE MEDICATIONS:  1. Aspirin 81 mg per day.  2. Norvasc 5 mg daily.  3. Multivitamin daily.  4. Fish oil 1000 mg b.i.d.  5. Glucosamine twice daily.  6. Flexidol twice daily.  7. Tricor 145 mg per day.  8. Plavix 75 mg per day.   The patient is instructed to have follow up with Dr. Swaziland in 1 week.  He is not to return to work until reevaluated.  It was recommended that  he does not drive at this point.   DISCHARGE STATUS:  Improved.           ______________________________  [name], M.D.     PMJ/MEDQ  D:  05/05/2007  T:  05/05/2007  Job:  [id number]

## 2010-08-09 ENCOUNTER — Other Ambulatory Visit: Payer: Self-pay | Admitting: Cardiology

## 2010-08-11 ENCOUNTER — Other Ambulatory Visit: Payer: Self-pay | Admitting: Cardiology

## 2010-08-11 MED ORDER — FENOFIBRATE 145 MG PO TABS: 145.0000 mg | ORAL_TABLET | Freq: Every day | ORAL | Status: DC

## 2010-08-11 MED ORDER — AMLODIPINE BESYLATE 5 MG PO TABS: 5.0000 mg | ORAL_TABLET | Freq: Every day | ORAL | Status: DC

## 2010-08-11 NOTE — Telephone Encounter (Signed)
Pt wants refill of Tricor and amlodipine. He has a Journalist, [employer] on [phone]

## 2010-08-11 NOTE — Telephone Encounter (Signed)
Refill on meds but send to [facility]

## 2010-08-11 NOTE — Telephone Encounter (Signed)
escribe medication per fax request  

## 2010-09-29 LAB — POCT CARDIAC MARKERS
CKMB, poc: 1.6
CKMB, poc: 5.3
Myoglobin, poc: 305
Myoglobin, poc: 46.6
Operator id: 282201
Operator id: 294501
Troponin i, poc: 0.07 — ABNORMAL HIGH
Troponin i, poc: <0.05

## 2010-09-29 LAB — LIPID PANEL
Cholesterol: 176
Cholesterol: 201 — ABNORMAL HIGH
HDL: 35 — ABNORMAL LOW
HDL: 36 — ABNORMAL LOW
LDL Cholesterol: 122 — ABNORMAL HIGH
LDL Cholesterol: 142 — ABNORMAL HIGH
Total CHOL/HDL Ratio: 5
Total CHOL/HDL Ratio: 5.6
Triglycerides: 117
Triglycerides: 96
VLDL: 19
VLDL: 23

## 2010-09-29 LAB — COMPREHENSIVE METABOLIC PANEL
ALT: 25
AST: 47 — ABNORMAL HIGH
Albumin: 4
Alkaline Phosphatase: 50
BUN: 15
CO2: 23
Calcium: 9
Chloride: 106
Creatinine, Ser: 1
GFR calc Af Amer: >60
GFR calc non Af Amer: >60
Glucose, Bld: 112 — ABNORMAL HIGH
Potassium: 4.9
Sodium: 137
Total Bilirubin: 0.9
Total Protein: 6.5

## 2010-09-29 LAB — CBC
HCT: 38.6 — ABNORMAL LOW
HCT: 41.6
HCT: 42.1
Hemoglobin: 12.9 — ABNORMAL LOW
Hemoglobin: 14.3
Hemoglobin: 14.4
MCHC: 33.3
MCHC: 34.3
MCHC: 34.3
MCV: 92.3
MCV: 92.8
MCV: 93.3
Platelets: 217
Platelets: 256
Platelets: 292
RBC: 4.14 — ABNORMAL LOW
RBC: 4.51
RBC: 4.54
RDW: 13.5
RDW: 13.6
RDW: 13.6
WBC: 5.3
WBC: 5.6
WBC: 8.3

## 2010-09-29 LAB — CK TOTAL AND CKMB (NOT AT ARMC)
CK, MB: 118.9 — ABNORMAL HIGH
CK, MB: 41.1 — ABNORMAL HIGH
Relative Index: 13.8 — ABNORMAL HIGH
Relative Index: 16.8 — ABNORMAL HIGH
Total CK: 298 — ABNORMAL HIGH
Total CK: 706 — ABNORMAL HIGH

## 2010-09-29 LAB — BASIC METABOLIC PANEL
BUN: 13
CO2: 27
Calcium: 8.7
Chloride: 102
Creatinine, Ser: 1.13
GFR calc Af Amer: >60
GFR calc non Af Amer: >60
Glucose, Bld: 81
Potassium: 4.1
Sodium: 136

## 2010-09-29 LAB — APTT: aPTT: 25

## 2010-09-29 LAB — MAGNESIUM
Magnesium: 2
Magnesium: 2.1

## 2010-09-29 LAB — DIFFERENTIAL
Basophils Absolute: 0
Basophils Relative: 0
Eosinophils Absolute: 0.1
Eosinophils Relative: 1
Lymphocytes Relative: 29
Lymphs Abs: 1.5
Monocytes Absolute: 0.5
Monocytes Relative: 10
Neutro Abs: 3.2
Neutrophils Relative %: 60

## 2010-09-29 LAB — TROPONIN I
Troponin I: 0.84
Troponin I: 12.31

## 2010-09-29 LAB — PROTIME-INR
INR: 1
Prothrombin Time: 13

## 2010-09-29 LAB — TSH: TSH: 2.296

## 2010-09-30 LAB — BASIC METABOLIC PANEL
BUN: 17
BUN: 19
CO2: 26
CO2: 28
Calcium: 9.1
Calcium: 9.2
Chloride: 105
Chloride: 105
Creatinine, Ser: 1.11
Creatinine, Ser: 1.15
GFR calc Af Amer: >60
GFR calc Af Amer: >60
GFR calc non Af Amer: >60
GFR calc non Af Amer: >60
Glucose, Bld: 86
Glucose, Bld: 97
Potassium: 4
Potassium: 4.5
Sodium: 138
Sodium: 139

## 2010-09-30 LAB — CBC
HCT: 40.3
HCT: 42.5
Hemoglobin: 13.9
Hemoglobin: 14.4
MCHC: 33.8
MCHC: 34.5
MCV: 91.9
MCV: 92.8
Platelets: 224
Platelets: 251
RBC: 4.38
RBC: 4.58
RDW: 12.7
RDW: 13.2
WBC: 4.2
WBC: 5

## 2010-09-30 LAB — PROTIME-INR
INR: 1
Prothrombin Time: 13.5

## 2010-09-30 LAB — CARDIAC PANEL(CRET KIN+CKTOT+MB+TROPI)
CK, MB: 2.6
CK, MB: 3.2
Relative Index: INVALID
Relative Index: INVALID
Total CK: 76
Total CK: 99
Troponin I: 0.72
Troponin I: 1.24

## 2010-10-02 ENCOUNTER — Encounter: Payer: Self-pay | Admitting: Cardiovascular Disease

## 2010-10-02 ENCOUNTER — Ambulatory Visit (INDEPENDENT_AMBULATORY_CARE_PROVIDER_SITE_OTHER): Payer: BC Managed Care – PPO | Admitting: Cardiovascular Disease

## 2010-10-02 VITALS — BP 150/90 | HR 68 | Ht 74.0 in | Wt 220.0 lb

## 2010-10-02 DIAGNOSIS — I2510 Atherosclerotic heart disease of native coronary artery without angina pectoris: Secondary | ICD-10-CM

## 2010-10-02 DIAGNOSIS — I319 Disease of pericardium, unspecified: Secondary | ICD-10-CM

## 2010-10-02 DIAGNOSIS — I10 Essential (primary) hypertension: Secondary | ICD-10-CM

## 2010-10-02 DIAGNOSIS — R079 Chest pain, unspecified: Secondary | ICD-10-CM

## 2010-10-02 LAB — TROPONIN I: Troponin I: <0.01 ng/mL (ref <0.06)

## 2010-10-02 LAB — CK TOTAL AND CKMB (NOT AT ARMC)
CK, MB: 4.1 ng/mL — ABNORMAL HIGH (ref 0.3–4.0)
Relative Index: 2.3 (ref 0.0–2.5)
Total CK: 178 U/L (ref 7–232)

## 2010-10-02 MED ORDER — COLCHICINE 0.6 MG PO TABS: 0.6000 mg | ORAL_TABLET | Freq: Two times a day (BID) | ORAL | Status: DC

## 2010-10-02 NOTE — Patient Instructions (Addendum)
Colchicine 0.6 mg twice a day for several days.   Then switch to Aleve or Motrin.   Return to see Dr. Swaziland in 1 month.   Call us sooner if the pain worsens  Your physician has requested that you have an echocardiogram. Echocardiography is a painless test that uses sound waves to create images of your heart. It provides your doctor with information about the size and shape of your heart and how well your heart's chambers and valves are working. This procedure takes approximately one hour. There are no restrictions for this procedure.

## 2010-10-02 NOTE — Progress Notes (Signed)
[name] Date of Birth  [birth year] [facility] [address]    [address] [place], [place]  [address] [phone]  Fax  [phone]  History of Present Illness:  Cc: chest pain.  45 her old gentleman with a history of coronary artery disease. He has several stents in his right coronary artery he presents today with some episodes of chest pain. His last cardiac catheterization was in April 2009 which revealed patent RCA stents. He has a 70-80% stenosis in the origin of the posterior lateral branch. The posterior descending artery has a 50% ostial stenosis. The left main, LAD, and circumflex artery normal. He also has a large intermediate artery which is normal.  He's been having intermittent chest pain for the past 2-3 weeks. He was seen by his medical doctor and was thought to have an upper respiratory tract infection.  He had a cough that started 2 weeks ago but he has not coughed in the past week or so.  The pain is worse when he lies down and improves when he sits forward. It does not worsen with movement.  There is no pleuritic component.  There is no association to exercise or eating or drinking.  He walked 5 miles yesterday a 15 minute mile pace and did not have any episodes of chest pain.  He thinks that these pains may be similar to his previous episodes of chest pain before he had stents put in. He does comment that the pains are slightly different every times and is not really sure.  Current Outpatient Prescriptions on File Prior to Visit  Medication Sig Dispense Refill  . amLODipine (NORVASC) 5 MG tablet Take 1 tablet (5 mg total) by mouth daily.  30 tablet  5  . aspirin 81 MG tablet Take 81 mg by mouth daily.        . fenofibrate (TRICOR) 145 MG tablet Take 1 tablet (145 mg total) by mouth daily.  30 tablet  5  . Flaxseed, Linseed, (FLAX SEED OIL PO) Take by mouth 3 (three) times daily.        . Misc Natural Products (MELLOW-TONE PO) Take by mouth 3 (three) times  daily. 2 TID       . Multiple Vitamin (MULTIVITAMIN) tablet Take 1 tablet by mouth 2 (two) times daily.        . ramipril (ALTACE) 2.5 MG tablet Take 2.5 mg by mouth daily.          Allergies  Allergen Reactions  . Ezetimibe   . Niacin   . Statins     Past Medical History  Diagnosis Date  . Hypertension   . Hypercholesterolemia   . LBBB (left bundle branch block)   . MI (myocardial infarction) 1993    LATERAL  . Varicose veins   . Arthritis     Past Surgical History  Procedure Date  . Coronary angioplasty     DIRECT ANGIOPLASTY THE MARGINAL BRANCH    History  Smoking status  . Former Smoker  . Quit date: 05/06/2005  Smokeless tobacco  . Not on file    History  Alcohol Use No    Family History  Problem Relation Age of Onset  . Heart attack Father   . Heart disease Brother     Reviw of Systems:  Reviewed in the HPI.  All other systems are negative.  Physical Exam: BP 150/90  Pulse 68  Ht 6\' 2"  (1.88 m)  Wt 220 lb (99.791 kg)  BMI 28.25 kg/m2 The patient is alert and oriented x 3.  The mood and affect are normal.   Skin: warm and dry.  Color is normal.    HEENT:   the sclera are nonicteric.  The mucous membranes are moist.  The carotids are 2+ without bruits.  There is no thyromegaly.  There is no JVD.    Lungs: clear.  The chest wall is non tender.    Heart: regular rate with a normal S1 and S2.  There are no murmurs, gallops, or rubs. The PMI is not displaced.     Abdomen: good bowel sounds.  There is no guarding or rebound.  There is no hepatosplenomegaly or tenderness.  There are no masses.   Extremities:  no clubbing, cyanosis, or edema.  The legs are without rashes.  The distal pulses are intact.   Neuro:  Cranial nerves II - XII are intact.  Motor and sensory functions are intact.    The gait is normal.  ECG:  Assessment / Plan:

## 2010-10-02 NOTE — Assessment & Plan Note (Addendum)
[name] presents today with symptoms are consistent with pericarditis. He had an upper respiratory tract infection several weeks ago and I suspected he may have some pleuritis or pericarditis. His symptoms are clearly not exertional. He also denies any pleuritic chest pain. He denies any cough. He walked 5 miles yesterday and did not have any chest pain. He typically has chest pain when he lies down.  We will get an echocardiogram for further assessment of his pericardium and left ventricular function. His last Myoview study was was on March 1. It showed a mild degree of inferior ischemia. This completely correlates with his last cardiac catheterization in 2009 which showed moderate to severe disease of his posterior lateral branch and his posterior descending artery. The  RCA stents were widely patent.  His EKG reveals left bundle branch block at baseline. We'll also get a CPK MB and troponin level. We'll consider getting a stress test if he has continued chest pain.  We may need to consider cardiac catheterization.  We'll give him a prescription for colchicine 0.6 mg twice a day as needed for this pain. We'll also encourage him to take Aleve 220  mg 2 times a day or Motrin 600 mg 3 times a day.  We will see him back in the office in several weeks. He is to call sooner if he has continued or worsening chest pain.

## 2010-10-03 ENCOUNTER — Telehealth: Payer: Self-pay | Admitting: *Deleted

## 2010-10-03 NOTE — Telephone Encounter (Signed)
Message copied by Antony Odea on Fri Oct 03, 2010 11:55 AM ------      Message from: Vesta Mixer      Created: Thu Oct 02, 2010  4:49 PM       Labs are OK.

## 2010-10-03 NOTE — Telephone Encounter (Signed)
CALLED PT BACK WITH NEGATIVE CARDIAC ENZYME  LAB RESULTS. pT STATES PAIN IN CHEST IS NO BETTER. WHEN HE LAYS DOWN THE PAIN IS MUCH MORE INTENSE. HE HAS NOW SLEPT 3 NIGHTS IN UPRIGHT POSITION. PLEASE ADVISE.

## 2010-10-05 NOTE — Telephone Encounter (Signed)
I attempted to call [name] back but received no answer.  I've advised him to come to the ER if he is still having significant chest pain.  The pain is significantly worse with lying down and better sitting up.  I suspect he has pericarditis.  He is on Colchicine and Aleve with minimal results.  We may try prednisone.  He is scheduled for an echo.  He may also need a CT of chest to further evaluate this chest pain.

## 2010-10-06 ENCOUNTER — Ambulatory Visit
Admission: RE | Admit: 2010-10-06 | Discharge: 2010-10-06 | Disposition: A | Discharge status: Home / Self Care | Payer: BC Managed Care – PPO | Source: Ambulatory Visit | Attending: Cardiology | Admitting: Cardiology

## 2010-10-06 ENCOUNTER — Other Ambulatory Visit: Payer: Self-pay | Admitting: *Deleted

## 2010-10-06 ENCOUNTER — Telehealth: Payer: Self-pay | Admitting: *Deleted

## 2010-10-06 ENCOUNTER — Telehealth: Payer: Self-pay | Admitting: Cardiology

## 2010-10-06 MED ORDER — IOHEXOL 300 MG/ML  SOLN: 75.0000 mL | Freq: Once | INTRAMUSCULAR | Status: AC | PRN

## 2010-10-06 NOTE — Telephone Encounter (Signed)
Notified of CT scan results. States when he had to lie on table his chest "was very painful". Is scheduled for Echo in AM. Will add him to Lori's schedule at 9:00 to try to work out cause of chest discomfort.

## 2010-10-06 NOTE — Telephone Encounter (Signed)
Pt was seen on Thurs and told to call back if no better.  He is not any better and would like to be seen today by Dr. Swaziland.  Please call him back,.

## 2010-10-06 NOTE — Telephone Encounter (Signed)
Message copied by Lorayne Bender on Mon Oct 06, 2010  5:32 PM ------      Message from: [name], PETER M      Created: Mon Oct 06, 2010  4:57 PM       CT is unremarkable.      [name]

## 2010-10-06 NOTE — Telephone Encounter (Signed)
Called stating he is not any better. Still having chest pain. Wants to be seen today. Spoke w/ Dr. Swaziland and will work him in to see Lawson Fiscal. Dr. Elease Hashimoto also spoke w/him and wants him to have a CT chest w/contrast. Dr. Swaziland agrees. So [name] will go to [facility] imagining at [employer] as walk in today to get CT. Will call w/results.

## 2010-10-07 ENCOUNTER — Ambulatory Visit (HOSPITAL_BASED_OUTPATIENT_CLINIC_OR_DEPARTMENT_OTHER): Payer: BC Managed Care – PPO | Admitting: Radiology

## 2010-10-07 ENCOUNTER — Observation Stay (HOSPITAL_COMMUNITY)
Admission: AD | Admit: 2010-10-07 | Discharge: 2010-10-08 | Disposition: A | Discharge status: Home / Self Care | Payer: BC Managed Care – PPO | Source: Ambulatory Visit | Attending: Cardiology | Admitting: Cardiology

## 2010-10-07 ENCOUNTER — Encounter: Payer: Self-pay | Admitting: *Deleted

## 2010-10-07 ENCOUNTER — Ambulatory Visit (INDEPENDENT_AMBULATORY_CARE_PROVIDER_SITE_OTHER): Payer: BC Managed Care – PPO | Admitting: Nurse Practitioner

## 2010-10-07 ENCOUNTER — Encounter: Payer: Self-pay | Admitting: Nurse Practitioner

## 2010-10-07 VITALS — BP 138/82 | HR 67 | Ht 74.0 in | Wt 225.0 lb

## 2010-10-07 DIAGNOSIS — I2510 Atherosclerotic heart disease of native coronary artery without angina pectoris: Secondary | ICD-10-CM

## 2010-10-07 DIAGNOSIS — I447 Left bundle-branch block, unspecified: Secondary | ICD-10-CM | POA: Insufficient documentation

## 2010-10-07 DIAGNOSIS — I359 Nonrheumatic aortic valve disorder, unspecified: Secondary | ICD-10-CM | POA: Insufficient documentation

## 2010-10-07 DIAGNOSIS — Z9861 Coronary angioplasty status: Secondary | ICD-10-CM | POA: Insufficient documentation

## 2010-10-07 DIAGNOSIS — R0789 Other chest pain: Principal | ICD-10-CM | POA: Insufficient documentation

## 2010-10-07 DIAGNOSIS — E785 Hyperlipidemia, unspecified: Secondary | ICD-10-CM | POA: Insufficient documentation

## 2010-10-07 DIAGNOSIS — R079 Chest pain, unspecified: Secondary | ICD-10-CM

## 2010-10-07 DIAGNOSIS — I10 Essential (primary) hypertension: Secondary | ICD-10-CM | POA: Insufficient documentation

## 2010-10-07 DIAGNOSIS — R072 Precordial pain: Secondary | ICD-10-CM | POA: Insufficient documentation

## 2010-10-07 DIAGNOSIS — I319 Disease of pericardium, unspecified: Secondary | ICD-10-CM

## 2010-10-07 DIAGNOSIS — I252 Old myocardial infarction: Secondary | ICD-10-CM | POA: Insufficient documentation

## 2010-10-07 LAB — BASIC METABOLIC PANEL
BUN: 21 mg/dL (ref 6–23)
CO2: 27 mEq/L (ref 19–32)
Calcium: 9.9 mg/dL (ref 8.4–10.5)
Chloride: 103 mEq/L (ref 96–112)
Creatinine, Ser: 0.98 mg/dL (ref 0.50–1.35)
GFR calc Af Amer: >90 mL/min (ref >90)
GFR calc non Af Amer: >90 mL/min (ref >90)
Glucose, Bld: 87 mg/dL (ref 70–99)
Potassium: 4 mEq/L (ref 3.5–5.1)
Sodium: 139 mEq/L (ref 135–145)

## 2010-10-07 LAB — CBC
HCT: 42.1 % (ref 39.0–52.0)
Hemoglobin: 14.5 g/dL (ref 13.0–17.0)
MCH: 31.6 pg (ref 26.0–34.0)
MCHC: 34.4 g/dL (ref 30.0–36.0)
MCV: 91.7 fL (ref 78.0–100.0)
Platelets: 248 10*3/uL (ref 150–400)
RBC: 4.59 MIL/uL (ref 4.22–5.81)
RDW: 13.3 % (ref 11.5–15.5)
WBC: 5.1 10*3/uL (ref 4.0–10.5)

## 2010-10-07 LAB — CARDIAC PANEL(CRET KIN+CKTOT+MB+TROPI)
CK, MB: 2.8 ng/mL (ref 0.3–4.0)
Relative Index: INVALID (ref 0.0–2.5)
Total CK: 75 U/L (ref 7–232)
Troponin I: <0.3 ng/mL (ref <0.30)

## 2010-10-07 LAB — PROTIME-INR
INR: 1 (ref 0.00–1.49)
Prothrombin Time: 13.4 seconds (ref 11.6–15.2)

## 2010-10-07 LAB — MRSA PCR SCREENING: MRSA by PCR: NEGATIVE

## 2010-10-07 NOTE — Telephone Encounter (Signed)
Pt calling again wanting to speak to Synetta Fail wanting to know why he is being admitted to the hospital. He thought he was having a OP CATH done, not inpatient. Please call pt back.

## 2010-10-07 NOTE — Progress Notes (Signed)
[name] Date of Birth: 04-Jul-1955   History of Present Illness: [name] is seen back today for a work in visit. He is seen for Dr. Swaziland. He continues to have chest pain. It is in the left breast. It is worse with lying back. It is not exertional but feels like his prior chest pain syndrome before his stents. He has been nauseated and belching. He has tried NSAIDS and colchicine without relief. He has tried Pepcid without relief. He has had a negative CT of his chest. He had an echo earlier this morning. Dr. Myrtis Ser has looked at it. No vegetation. Some inferior hypokinesis. I have given him some NTG with some relief in the office. He is frustrated. Walking makes him feel better. f  Current Outpatient Prescriptions on File Prior to Visit  Medication Sig Dispense Refill  . amLODipine (NORVASC) 5 MG tablet Take 1 tablet (5 mg total) by mouth daily.  30 tablet  5  . aspirin 81 MG tablet Take 81 mg by mouth daily.        . fenofibrate (TRICOR) 145 MG tablet Take 1 tablet (145 mg total) by mouth daily.  30 tablet  5  . Flaxseed, Linseed, (FLAX SEED OIL PO) Take by mouth 3 (three) times daily.        . Multiple Vitamin (MULTIVITAMIN) tablet Take 1 tablet by mouth 2 (two) times daily.        . ramipril (ALTACE) 2.5 MG tablet Take 2.5 mg by mouth daily.         Current Facility-Administered Medications on File Prior to Visit  Medication Dose Route Frequency Provider Last Rate Last Dose  . iohexol (OMNIPAQUE) 300 MG/ML injection 75 mL  75 mL Intravenous Once PRN Medication Radiologist        Allergies  Allergen Reactions  . Ezetimibe   . Niacin   . Statins     Past Medical History  Diagnosis Date  . Hypertension   . Hypercholesterolemia   . LBBB (left bundle branch block)   . MI (myocardial infarction) 1993    LATERAL  . Varicose veins   . Arthritis   . S/P coronary artery stent placement     RCA    Past Surgical History  Procedure Date  . Coronary angioplasty     DIRECT  ANGIOPLASTY THE MARGINAL BRANCH  . Coronary stent placement     RCA  . Cardiac catheterization 2009    Stents in RCA patent. 70 to 80% PL, and 50% ostial PD.     History  Smoking status  . Former Smoker  . Quit date: 05/06/2005  Smokeless tobacco  . Not on file    History  Alcohol Use No    Family History  Problem Relation Age of Onset  . Heart attack Father   . Heart disease Brother     Review of Systems: The review of systems is per the HPI.  All other systems were reviewed and are negative.  Physical Exam: BP 138/82  Pulse 67  Wt 225 lb (102.059 kg) Patient is pleasant and in no acute distress. Skin is warm and dry. Color is normal.  HEENT is unremarkable. Normocephalic/atraumatic. PERRL. Sclera are nonicteric. Neck is supple. No masses. No JVD. Lungs are clear. Cardiac exam shows a regular rate and rhythm. No chest wall pain. Abdomen is soft. Extremities are without edema. Gait and ROM are intact. No gross neurologic deficits noted.   LABORATORY DATA: EKG shows left  bundle branch block.   Assessment / Plan:

## 2010-10-07 NOTE — Assessment & Plan Note (Signed)
Chest pain continues. We will refer on for cardiac cath with Dr. Swaziland for later today. The procedure, risks and benefits have been reviewed and he is willing to proceed.

## 2010-10-07 NOTE — Patient Instructions (Signed)
We are going to proceed with cardiac cath today.

## 2010-10-07 NOTE — Telephone Encounter (Signed)
Pt just left office and went to [facility] to get a CATH outpatient, and pt said he was just admitted to the hospital and wants to know why. Please return pt call to discuss further.

## 2010-10-07 NOTE — Telephone Encounter (Signed)
Called wanting to know why he was being admitted. Thought he was for OP. Advised that because he was having CP could not go to Short Stay. Has to get labs prior to cath and is in observation for cath this afternoon

## 2010-10-08 ENCOUNTER — Encounter: Payer: Self-pay | Admitting: *Deleted

## 2010-10-08 ENCOUNTER — Inpatient Hospital Stay (HOSPITAL_COMMUNITY): Payer: BC Managed Care – PPO

## 2010-10-08 LAB — CARDIAC PANEL(CRET KIN+CKTOT+MB+TROPI)
CK, MB: 2.4 ng/mL (ref 0.3–4.0)
Relative Index: INVALID (ref 0.0–2.5)
Total CK: 54 U/L (ref 7–232)
Troponin I: <0.3 ng/mL (ref <0.30)

## 2010-10-08 LAB — CBC
HCT: 39.2 % (ref 39.0–52.0)
Hemoglobin: 13 g/dL (ref 13.0–17.0)
MCH: 31 pg (ref 26.0–34.0)
MCHC: 33.2 g/dL (ref 30.0–36.0)
MCV: 93.3 fL (ref 78.0–100.0)
Platelets: 229 10*3/uL (ref 150–400)
RBC: 4.2 MIL/uL — ABNORMAL LOW (ref 4.22–5.81)
RDW: 13.5 % (ref 11.5–15.5)
WBC: 6.9 10*3/uL (ref 4.0–10.5)

## 2010-10-08 LAB — BASIC METABOLIC PANEL
BUN: 21 mg/dL (ref 6–23)
CO2: 29 mEq/L (ref 19–32)
Calcium: 9.3 mg/dL (ref 8.4–10.5)
Chloride: 102 mEq/L (ref 96–112)
Creatinine, Ser: 0.95 mg/dL (ref 0.50–1.35)
GFR calc Af Amer: >90 mL/min (ref >90)
GFR calc non Af Amer: >90 mL/min (ref >90)
Glucose, Bld: 123 mg/dL — ABNORMAL HIGH (ref 70–99)
Potassium: 4.3 mEq/L (ref 3.5–5.1)
Sodium: 138 mEq/L (ref 135–145)

## 2010-10-08 MED ORDER — IOHEXOL 350 MG/ML SOLN
100.0000 mL | Freq: Once | INTRAVENOUS | Status: AC | PRN
  Administered 2010-10-08: 100 mL via INTRAVENOUS

## 2010-10-08 NOTE — Cardiovascular Report (Signed)
NAMEVONDELL, [name] NO.:  [id number]  MEDICAL RECORD NO.:  [id number]  LOCATION:  2921                         FACILITY:  [facility]  PHYSICIAN:  Veverly Fells. Excell Seltzer, MD  DATE OF BIRTH:  Mar 22, 1955  DATE OF PROCEDURE:  10/07/2010 DATE OF DISCHARGE:                           CARDIAC CATHETERIZATION   PROCEDURES: 1. Left heart catheterization. 2. Selective coronary angiogram. 3. Left ventricular angiogram.  PROCEDURAL INDICATIONS:  [name] is a 55 year old gentleman with coronary artery disease.  He underwent stenting of the right coronary artery in 2009, by Dr. Swaziland.  At that time, he had presented with acute coronary syndrome.  He had nonobstructive disease in the left coronary tree.  He presents now with chest pain occurring in the supine position.  He has been treated for pericarditis and this has not helped. The patient has undergone an echocardiogram and a CT scan of the chest without contrast.  These studies did not demonstrate clear etiologies of his chest pain.  He was referred for cardiac cath to rule out progressive CAD.  Risks and indications of procedure were reviewed with the patient. Informed consent was obtained.  The left wrist was prepped, draped, and anesthetized with 1% lidocaine using modified Seldinger technique.  A 5- French sheath was placed in left radial artery.  Standard Judkins catheters were used for coronary angiography and left ventriculography. The patient tolerated the procedure well.  Catheter exchanges were performed over a guidewire.  There were no immediate complications.  PROCEDURAL FINDINGS:  The aortic pressure 103/59 with a mean of 87, left ventricular pressure 116/17.  Left ventriculography shows mild hypokinesis of the inferior wall.  The overall LV ejection fraction is preserved at 50%.  The other LV walls contract normally.  Right coronary artery.  The right coronary artery has a 50% proximal stenosis.  The  mid and distal vessel had stents, both of which are patent.  The mid stent is widely patent.  The distal stent has mild 20- 30% in-stent restenosis.  There is a third stent in the posterior AV segment which is widely patent.  Beyond that stent in the posterolateral branch, there is a 50% stenosis in an area where the vessel diameter is very small.  The PDA is small and it is a patent vessel.  Left mainstem:  The left main is widely patent.  There is no obstructive disease present.  It trifurcates into the LAD, intermediate branch, and left circumflex.  LAD.  The LAD was a little bit difficult to visualize because of catheter direction in to the intermediate/left circumflex.  The proximal LAD appears to have mild hypodensity with 30% stenosis involved in the ostial and proximal segment.  There does not appear to be any flow- limiting disease.  The LAD has a second 30% stenosis just before the first diagonal.  The mid distal vessel have no obstructive disease.  Left circumflex.  There is a large intermediate branch with a 40-50% proximal stenosis.  This does not appear to be flow limiting.  The AV groove circumflex is a smaller vessel which is widely patent.  FINAL ASSESSMENT: 1. Patency of multiple stents in the right coronary  artery. 2. Nonobstructive disease of the left circumflex and left anterior     descending coronary artery. 3. Mild segmental left ventricular systolic dysfunction.  RECOMMENDATIONS:  The patient will continue with medical treatment for his coronary artery disease.  There is no clear cardiac cause of his chest pain at the present time.     Veverly Fells. Excell Seltzer, MD     MDC/MEDQ  D:  10/07/2010  T:  10/08/2010  Job:  [id number]  Electronically Signed by Tonny Bollman MD on 10/08/2010 10:35:43 PM

## 2010-10-08 NOTE — Discharge Summary (Addendum)
[name], [name] NO.:  [id number]  MEDICAL RECORD NO.:  [id number]  LOCATION:                                 FACILITY:  PHYSICIAN:  Tahani Potier M. [place], M.D.  DATE OF BIRTH:  Mar 18, 1955  DATE OF ADMISSION:  10/07/2010 DATE OF DISCHARGE:  10/08/2010                              DISCHARGE SUMMARY   DISCHARGE DIAGNOSES: 1. Chest pain, felt noncardiac.     a.     Catheterization showing patency of multiple stents in the      right coronary artery and otherwise nonobstructive disease of the      left circumflex and left anterior descending.     b.     CT angio negative for PE.     c.     Negative for cardiac enzymes x2. 2. Left bundle-branch block. 3. Hyperlipidemia with history of statin intolerance. 4. Hypertension. 5. History of venous varicosities.  HOSPITAL COURSE:  [name] is a 55 year old gentleman with a history of CAD, who was seen as a working visit for Dr. Swaziland, who complained of chest pain, worse on lying back.  It is nonexertional, Bystolic, prior chest pain syndrome prior to the stent.  Nitroglycerin possibly gave some relief in the office.  Walking makes him feel better.  Symptoms are felt atypical, although definitive.  Catheterization was recommended. He was admitted to the hospital and this showed patent RCA stent, but was otherwise nonobstructive CAD.  He had an EF of 50%.  CT of the chest was obtained with contrast showing mild emphysematous changes in  the upper lobe.  Slight fusiform prominence of the ascending aorta, measuring 39 mm in diameter.  The patient's chest pain was felt atypical in nature, certainly noncardiac per Dr. Swaziland.   I discussed the patient's case with radiology to determine if the CT of the chest was sufficient to rule out PE, and it was not. I discussed this with Dr. Swaziland prior to discharge, who elected to proceed with CTA to rule out PE, which was negative for pulmonary embolism. Dr. Swaziland like  to initiate him on a trial of Flexeril and tramadol.  Dr. Swaziland has seen and examined him today and feels he is stable for discharge.  DISCHARGE LABS:  Sodium 138, potassium 4.3, chloride 102, CO2 29, glucose 123, BUN 21, creatinine 0.95.  CK-MB and troponin negative x2.  STUDIES: 1. CT of the chest, see above. 2. Cardiac catheterization, see above. 3. CT angio of hte chest, see above.  DISCHARGE MEDICATIONS: 1. Cyclobenzaprine 10 mg t.i.d. p.r.n.  The patient is instructed not     to drive until he know how it affect him given that it can make him     sleepy. 2. Tramadol 50 mg b.i.d. p.r.n. 3. Amlodipine 5 mg daily. 4. Aspirin 81 mg. 5. Multivitamin one tablet b.i.d. 6. Prilosec 1 tablet daily as needed for indigestion. 7. Ramipril 2.5 mg daily. 8. TriCor 145 mg at bedtime.  DISPOSITION:  [name] is discharged in stable condition to home. He is to follow a low-sodium heart-healthy diet, not to lift anything over 5 pounds for 1 week.  He  is to call or return for pain, swelling, bleeding, or pus at his cath site.  He will follow up with Dr. Swaziland as an outpatient and our office will call him with this appointment.  DURATION OF DISCHARGE ENCOUNTER:  Greater than 30 minutes including physician and PA time.     Ronie Spies, P.A.C.   ______________________________ Michaeal Davis M. [place], M.D.    DD/MEDQ  D:  10/08/2010  T:  10/08/2010  Job:  [id number]  Electronically Signed by Ronie Spies  on 10/08/2010 09:20:46 PM Electronically Signed by Yuritzi Kamp Sw[place]and M.D. on 10/14/2010 01:02:51 PM

## 2010-10-22 ENCOUNTER — Ambulatory Visit: Payer: BC Managed Care – PPO | Admitting: Cardiology

## 2010-10-22 ENCOUNTER — Telehealth: Payer: Self-pay | Admitting: *Deleted

## 2010-10-22 NOTE — Telephone Encounter (Signed)
Message copied by Eugenia Pancoast on Wed Oct 22, 2010  9:30 AM ------      Message from: Vesta Mixer      Created: Tue Oct 21, 2010  6:23 PM       Discussed with patiehnt

## 2010-10-22 NOTE — Telephone Encounter (Signed)
Dr. Elease Hashimoto discussed with patient

## 2010-11-14 ENCOUNTER — Encounter: Payer: Self-pay | Admitting: Family Medicine

## 2010-11-14 ENCOUNTER — Ambulatory Visit (INDEPENDENT_AMBULATORY_CARE_PROVIDER_SITE_OTHER): Payer: BC Managed Care – PPO | Admitting: Family Medicine

## 2010-11-14 VITALS — BP 130/80 | HR 84 | Ht 74.0 in | Wt 220.6 lb

## 2010-11-14 DIAGNOSIS — M549 Dorsalgia, unspecified: Secondary | ICD-10-CM

## 2010-11-14 DIAGNOSIS — G8929 Other chronic pain: Secondary | ICD-10-CM

## 2010-11-14 DIAGNOSIS — M542 Cervicalgia: Secondary | ICD-10-CM

## 2010-11-14 MED ORDER — TRAMADOL HCL 50 MG PO TABS: 50.0000 mg | ORAL_TABLET | Freq: Four times a day (QID) | ORAL | Status: DC | PRN

## 2010-11-14 NOTE — Patient Instructions (Signed)
Dear Mr. Pino,   Thank you for coming to clinic today. It was a pleasure to meet you. Please read below regarding your issues:  Neck Pain - please start with taking Tramadol up to 4 times daily as need. Also, I have ordered an X-ray for your neck. You can go to the imaging department at [facility] anytime that is convenient. Please schedule follow-up in 3 weeks for [place] to assess the pain and next step for care.   Sincerely,   Dr. Clinton Sawyer

## 2010-11-18 ENCOUNTER — Ambulatory Visit
Admission: RE | Admit: 2010-11-18 | Discharge: 2010-11-18 | Disposition: A | Discharge status: Home / Self Care | Payer: BC Managed Care – PPO | Source: Ambulatory Visit | Attending: Family Medicine | Admitting: Family Medicine

## 2010-11-18 DIAGNOSIS — G8929 Other chronic pain: Secondary | ICD-10-CM

## 2010-11-18 DIAGNOSIS — M542 Cervicalgia: Secondary | ICD-10-CM

## 2010-12-01 ENCOUNTER — Encounter: Payer: Self-pay | Admitting: Family Medicine

## 2010-12-01 DIAGNOSIS — G8929 Other chronic pain: Secondary | ICD-10-CM | POA: Insufficient documentation

## 2010-12-01 NOTE — Progress Notes (Signed)
Subjective:    Patient ID: [name], male    DOB: [birth year], 55 y.o.   MRN: [id number]  HPI [name] presents today for his first clinic visit at the [name]. He previously did not have a primary care physician and was being followed by his cardiologist.  #1 neck pain: [name] has chronic neck pain of 20 years duration. Your relapses and remits spontaneously. He describes it as a sharp pain that starts in the central part of his neck and radiates laterally along his trapezius. He does not extend past his shoulders, nor radiate into his arms. It is not associated with any weakness, numbness, or tingling in his upper extremities. He has tried numerous therapies including massage therapy, acupuncture 3 times per month for the past 4 months, chiropractor visits up to 8 times per month, and high doses of Aleve. These have all worked temporarily, but have not reduce the pain for extended periods of time. He denies any major trauma or surgery to this area. Recently, he has reduced his upper body exercises in an attempt to allow the pain to subside. However, this has not been effective. [name] has stopped taking Aleve or Tylenol, because he is concerned that these medicines may injure  his liver and kidneys. He has tried Flexeril in the past, but it does not provide relief.  #2 back pain: [name] also has a history of chronic back pain in his lower back. It is centrally located and is not radiate into his buttocks or legs. It is exacerbated by walking long distances. His previous workup for this problem includes an MRI that showed a pinch nerve in his back. He does not remember the date of this imaging and no intervention took place as a result of this. Additionally he has had multiple cortisone injections of the sacroiliac joint that were successful for 6-8 months at a time. However these have not arrived in long-term pain relief. He has had no major trauma or surgeries of  the spinal column. Mr. Gibeault denies any bowel or bladder incontinence.   #3 chest pain: [name] has a recent history of left-sided chest pain. This occurred 3 weeks ago he was hospitalized. At that time a cardiac catheterization was performed, and a determined that his cardiac stents were patent he was discharged and no changes were made to his medical regimen. Since that time, he denies any chest pain. However given his history of multiple myocardial infarctions, this is something that he wants me to be aware of.   Review of Systems Negative unless stated in the history of present illness.    Objective:   Physical Exam BP 130/80  Pulse 84  Ht 6\' 2"  (1.88 m)  Wt 220 lb 9.6 oz (100.064 kg)  BMI 28.32 kg/m2 Gen. Alert and oriented x3, no apparent distress, healthy-appearing middle-aged gentleman, pleasant and cooperative Head: Normocephalic, atraumatic  Neck: No bony abnormalities, normal range of motion with flexion and extension, limited right and left rotation secondary to pain, no tenderness to palpation along spinous processes, tenderness to palpation along trapezius bilaterally Back: No bony abnormalities of the spinous processes mild tenderness to palpation along lumbar spine and posterior superior iliac crests Musculoskeletal: 5 out 5 strength of upper extremities and lower extremity bilaterally Neurological: Deep tendon reflexes normal, sensation intact in all extremities, no atrophy or deficits present       Assessment & Plan:  [name] is a 55 are old  gentleman with a history of coronary artery disease status post MI with stents, who presents today for new patient visit and complaints of chronic pain. 1. Chronic pain - while Mr. Al currently has pain, it is not excessive and is not present with any signs concerning for an emergent neurological or musculoskeletal problem. Therefore we will start with conservative management. Given his extensive use of multiple therapies in  the past including NSAIDs, massage therapy, acupuncture, and chiropractors, I will advise him to stretch and continue moderate exercise. Additionally, we will start a short course of tramadol. 2. Coronary artery disease - given that Mr. Balis denies current chest pain and has had a recent catheterization, I do not see any need for management other than regularly scheduled follow with his cardiologist Dr. Darl Pikes

## 2010-12-17 ENCOUNTER — Encounter: Payer: Self-pay | Admitting: Family Medicine

## 2010-12-17 ENCOUNTER — Ambulatory Visit (INDEPENDENT_AMBULATORY_CARE_PROVIDER_SITE_OTHER): Payer: BC Managed Care – PPO | Admitting: Family Medicine

## 2010-12-17 VITALS — BP 154/96 | HR 84 | Temp 98.0°F | Ht 74.0 in | Wt 220.0 lb

## 2010-12-17 DIAGNOSIS — M542 Cervicalgia: Secondary | ICD-10-CM

## 2010-12-17 DIAGNOSIS — G8929 Other chronic pain: Secondary | ICD-10-CM | POA: Insufficient documentation

## 2010-12-17 NOTE — Patient Instructions (Signed)
Dear Mr. Yett,   I appreciate you coming to see me in clinic today. Please read below regarding the specific issues that we addressed.   1. Neck Pain - Please have your MRI performed at [facility], and I will call you to discuss the results.   2. Ear Pain - You likely have a small amount of fluid behind your ear. It should resolve spontaneously as the iflammation in your inner ear decreases.  I will be in touch after your MRI.   Sincerely,   Dr. Clinton Sawyer

## 2010-12-17 NOTE — Assessment & Plan Note (Signed)
Given [name] has tried conservative management including NSAIDs and exercise, as well as trials of Flexeril and tramadol in addition to Adjunct treatments such as chiropractic, massage therapy, And acupuncture And he still has significant limitation in his daily life I believe That it is most appropriate to consider surgical management. However prior to referral to a surgeon we will obtain an MRI to evaluate the soft tissues of the neck to see if he is an appropriate candidate for referral.

## 2010-12-17 NOTE — Progress Notes (Signed)
  Subjective:    Patient ID: [name], male    DOB: Dec 12, 1955, 55 y.o.   MRN: [id number]  HPI [name] presents today as a followup for his chronic neck pain.  Neck pain: For the past 6 weeks we have tried conservative therapy which includes stretching exercise and tramadol for pain. Today [name] notes that the pain is not improved. Distal located in the central base of his neck and radiates to the right shoulder along the trapezius. It is sharp and shooting in nature and not improved from baseline. It limits his ability to rotate His head to the right and left as well as flex and extend his head. The pain is exacerbated by quick movements, sleeping, and exercise. He has had to stop exercising account for pain. Is relieved by nothing. Of note [name] also visited his chiropractor twice since last visit and got no relief.   Review of Systems Negative for numbness and tingling of his upper extremities, weakness of his arms or hands, chest pain or shortness of breath.    Objective:   Physical Exam BP 154/96  Pulse 84  Temp(Src) 98 F (36.7 C) (Oral)  Ht 6\' 2"  (1.88 m)  Wt 220 lb (99.791 kg)  BMI 28.25 kg/m2 Gen.: Alert, oriented, in moderate discomfort Cardiac: Regular rate and rhythm no murmurs rubs or gallops Musculoskeletal: Tenderness to palpation from the spinous process C7/T1 along the right trapezius; no tenderness to palpation of the right shoulder joint; Decreased range of motion of head rotation to the right greater than left; decreased range of motion flexion and extension of the neck; normal range of motion of shoulders bilaterally; 5\5 strength of upper shoulders bilaterally Neurologic: 2+ brachioradialis reflexes bilaterally; grip strength equal bilaterally; sensation intact and equal bilaterally in upper extremities   *RADIOLOGY REPORT*  Clinical Data: Chronic neck pain  CERVICAL SPINE - 2-3 VIEW  Comparison: None.  Findings: No prevertebral soft tissue swelling.  There is endplate  osteophytosis at C4-C5, C5-C6 and C6-C7. Mild joint space  narrowing at C4-C5 and C5-C6. Open mouth odontoid view is normal.  IMPRESSION:  Mild to moderate disc osteophytic disease from C4-C7.  Original Report Authenticated By: Genevive Bi, M.D.        Assessment & Plan:  [name] is a 55 year old gentleman with chronic neck pain that has failed conservative management needs further evaluation.

## 2010-12-22 ENCOUNTER — Telehealth: Payer: Self-pay | Admitting: Family Medicine

## 2010-12-22 DIAGNOSIS — G8929 Other chronic pain: Secondary | ICD-10-CM

## 2010-12-22 DIAGNOSIS — M542 Cervicalgia: Secondary | ICD-10-CM

## 2010-12-22 NOTE — Telephone Encounter (Signed)
Spoke with [place] from [employer], she states that they will not approve without speaking to MD first because a soft tissue MRI is not usually ordered unless patient has a known mass. MD needs to call [phone] and then choose opt. 2. Patient scheduled for this procedure on 12/24/10. Will forward to MD.

## 2010-12-22 NOTE — Telephone Encounter (Signed)
The insurance is calling to speak to [name] about authorization for an MRI.

## 2010-12-23 NOTE — Telephone Encounter (Signed)
Thank you for passing this along. I have spoken to the authorizing provider from [facility] and received permission for [name] to receive his MRI. It will be of the cervical spine. The authorization number is [id number]. I have already called the MRI department and provided this number. Therefore, the patient should have his MRI on 12/19 as planned.

## 2010-12-24 ENCOUNTER — Inpatient Hospital Stay (HOSPITAL_COMMUNITY): Admission: RE | Admit: 2010-12-24 | Payer: BC Managed Care – PPO | Source: Ambulatory Visit

## 2010-12-24 ENCOUNTER — Ambulatory Visit (HOSPITAL_COMMUNITY)
Admission: RE | Admit: 2010-12-24 | Discharge: 2010-12-24 | Disposition: A | Discharge status: Home / Self Care | Payer: BC Managed Care – PPO | Source: Ambulatory Visit | Attending: Family Medicine | Admitting: Family Medicine

## 2010-12-24 DIAGNOSIS — G8929 Other chronic pain: Secondary | ICD-10-CM

## 2010-12-24 DIAGNOSIS — M47812 Spondylosis without myelopathy or radiculopathy, cervical region: Secondary | ICD-10-CM | POA: Insufficient documentation

## 2010-12-24 DIAGNOSIS — M542 Cervicalgia: Secondary | ICD-10-CM

## 2010-12-24 NOTE — Telephone Encounter (Signed)
Addended by: Garen Grams F on: 12/24/2010 09:22 AM   Modules accepted: Orders

## 2011-01-01 ENCOUNTER — Telehealth: Payer: Self-pay | Admitting: Family Medicine

## 2011-01-01 DIAGNOSIS — M4800 Spinal stenosis, site unspecified: Secondary | ICD-10-CM

## 2011-01-01 NOTE — Telephone Encounter (Signed)
Called pt and informed, that Dr.Williamson will call him with result. Pt agreed and will wait for phone call. Fwd. To Dr.Williamson .Arlyss Repress

## 2011-01-01 NOTE — Telephone Encounter (Signed)
Patient called on mobile number; no answer; message left that MRI found arthritis and spinal stenosis; I told him that I would set up referral to neurosurgeon. I also advised him to call the clinic if he has any further questions. Therefore, I will put in an order for a referral. Please make this appointment for him. Thank you.

## 2011-01-01 NOTE — Telephone Encounter (Signed)
Pt is asking for the results of his MRI - he hasn't heard anything and needs to know what to do.

## 2011-01-08 ENCOUNTER — Telehealth: Payer: Self-pay | Admitting: Family Medicine

## 2011-01-08 NOTE — Telephone Encounter (Signed)
[name] is calling to check on the status of the referral for the Neurosurgeon.  He wants to see Dr. Jeral [name] with [name].

## 2011-01-09 NOTE — Telephone Encounter (Signed)
Called pt. He refers to see Dr.Botero only. He has never been a patient there. Referral was sent to [facility], but he does not want to be seen there. I told the pt, that we would schedule an appt with Dr.Botero for him. Lorenda Hatchet, Renato Battles

## 2011-01-09 NOTE — Telephone Encounter (Signed)
Called [facility] and left message for new patient coordinator ... Faxed request for pt to be seen by Dr.Botero. Waiting for confirmation from [facility]. Lorenda Hatchet, Renato Battles

## 2011-01-13 NOTE — Telephone Encounter (Signed)
Dr.Botero's office called and they will call pt, once they review fax. Pt aware. Lorenda Hatchet, Renato Battles

## 2011-01-19 ENCOUNTER — Other Ambulatory Visit: Payer: Self-pay | Admitting: Cardiology

## 2011-01-19 MED ORDER — FENOFIBRATE 145 MG PO TABS: 145.0000 mg | ORAL_TABLET | Freq: Every day | ORAL | Status: DC

## 2011-01-19 NOTE — Telephone Encounter (Signed)
Refill  Patient needs a prescription for generic due to cost.  Please return call to patient at hm#

## 2011-01-21 ENCOUNTER — Telehealth: Payer: Self-pay

## 2011-01-21 MED ORDER — FENOFIBRATE MICRONIZED 200 MG PO CAPS: 200.0000 mg | ORAL_CAPSULE | Freq: Every day | ORAL | Status: DC

## 2011-01-21 NOTE — Telephone Encounter (Signed)
Received fax message from [facility] stating patient wanting to change tricor, too expensive.Spoke to Dr.Jordan fenofibrate 200 mg daily before a meal prescribed.

## 2011-02-05 ENCOUNTER — Other Ambulatory Visit: Payer: Self-pay | Admitting: Cardiology

## 2011-02-06 ENCOUNTER — Other Ambulatory Visit: Payer: Self-pay | Admitting: Anesthesiology

## 2011-02-06 DIAGNOSIS — M549 Dorsalgia, unspecified: Secondary | ICD-10-CM

## 2011-02-06 DIAGNOSIS — M542 Cervicalgia: Secondary | ICD-10-CM

## 2011-02-09 ENCOUNTER — Ambulatory Visit
Admission: RE | Admit: 2011-02-09 | Discharge: 2011-02-09 | Disposition: A | Discharge status: Home / Self Care | Payer: BC Managed Care – PPO | Source: Ambulatory Visit | Attending: Anesthesiology | Admitting: Anesthesiology

## 2011-02-09 DIAGNOSIS — M542 Cervicalgia: Secondary | ICD-10-CM

## 2011-02-09 DIAGNOSIS — M549 Dorsalgia, unspecified: Secondary | ICD-10-CM

## 2011-02-09 MED ORDER — ONDANSETRON HCL 4 MG/2ML IJ SOLN: 4.0000 mg | Freq: Four times a day (QID) | INTRAMUSCULAR | Status: DC | PRN

## 2011-02-09 MED ORDER — IOHEXOL 300 MG/ML  SOLN
10.0000 mL | Freq: Once | INTRAMUSCULAR | Status: AC | PRN
  Administered 2011-02-09: 10 mL via INTRATHECAL

## 2011-02-09 MED ORDER — DIAZEPAM 5 MG PO TABS
10.0000 mg | ORAL_TABLET | Freq: Once | ORAL | Status: AC
  Administered 2011-02-09: 10 mg via ORAL

## 2011-02-09 NOTE — Progress Notes (Signed)
Explained discharge instructions, signed consent and valium given.  Debbie, pt's wife, will return to pick up pt when we call her.

## 2011-02-09 NOTE — Progress Notes (Signed)
Wife called to pick up pt at 9:00am.

## 2011-02-24 ENCOUNTER — Encounter (HOSPITAL_COMMUNITY): Payer: Self-pay | Admitting: Pharmacy Technician

## 2011-02-24 ENCOUNTER — Other Ambulatory Visit: Payer: Self-pay | Admitting: Neurosurgery

## 2011-02-24 ENCOUNTER — Encounter (HOSPITAL_COMMUNITY): Payer: Self-pay

## 2011-02-24 MED ORDER — CEFAZOLIN SODIUM-DEXTROSE 2-3 GM-% IV SOLR
2.0000 g | INTRAVENOUS | Status: DC
  Filled 2011-02-24: qty 50

## 2011-02-25 ENCOUNTER — Encounter (HOSPITAL_COMMUNITY): Payer: Self-pay | Admitting: Vascular Surgery

## 2011-02-25 ENCOUNTER — Other Ambulatory Visit: Payer: Self-pay

## 2011-02-25 ENCOUNTER — Ambulatory Visit (HOSPITAL_COMMUNITY): Payer: BC Managed Care – PPO

## 2011-02-25 ENCOUNTER — Encounter (HOSPITAL_COMMUNITY)
Admission: RE | Disposition: A | Discharge status: Home / Self Care | Payer: Self-pay | Source: Ambulatory Visit | Attending: Neurosurgery

## 2011-02-25 ENCOUNTER — Ambulatory Visit (HOSPITAL_COMMUNITY)
Admission: RE | Admit: 2011-02-25 | Discharge: 2011-02-25 | Disposition: A | Discharge status: Home / Self Care | Payer: BC Managed Care – PPO | Source: Ambulatory Visit | Attending: Neurosurgery | Admitting: Neurosurgery

## 2011-02-25 DIAGNOSIS — Z01812 Encounter for preprocedural laboratory examination: Secondary | ICD-10-CM | POA: Insufficient documentation

## 2011-02-25 DIAGNOSIS — Z01810 Encounter for preprocedural cardiovascular examination: Secondary | ICD-10-CM | POA: Insufficient documentation

## 2011-02-25 HISTORY — DX: Atherosclerotic heart disease of native coronary artery without angina pectoris: I25.10

## 2011-02-25 HISTORY — DX: Pneumonia, unspecified organism: J18.9

## 2011-02-25 LAB — CBC
HCT: 41.3 % (ref 39.0–52.0)
Hemoglobin: 14.1 g/dL (ref 13.0–17.0)
MCH: 31.6 pg (ref 26.0–34.0)
MCHC: 34.1 g/dL (ref 30.0–36.0)
MCV: 92.6 fL (ref 78.0–100.0)
Platelets: 224 10*3/uL (ref 150–400)
RBC: 4.46 MIL/uL (ref 4.22–5.81)
RDW: 13.5 % (ref 11.5–15.5)
WBC: 6.7 10*3/uL (ref 4.0–10.5)

## 2011-02-25 LAB — BASIC METABOLIC PANEL
BUN: 22 mg/dL (ref 6–23)
CO2: 28 mEq/L (ref 19–32)
Calcium: 10.1 mg/dL (ref 8.4–10.5)
Chloride: 103 mEq/L (ref 96–112)
Creatinine, Ser: 1.05 mg/dL (ref 0.50–1.35)
GFR calc Af Amer: 90 mL/min — ABNORMAL LOW (ref >90)
GFR calc non Af Amer: 78 mL/min — ABNORMAL LOW (ref >90)
Glucose, Bld: 84 mg/dL (ref 70–99)
Potassium: 4.3 mEq/L (ref 3.5–5.1)
Sodium: 138 mEq/L (ref 135–145)

## 2011-02-25 LAB — SURGICAL PCR SCREEN
MRSA, PCR: NEGATIVE
Staphylococcus aureus: NEGATIVE

## 2011-02-25 SURGERY — ANTERIOR CERVICAL DECOMPRESSION/DISCECTOMY FUSION 3 LEVELS
Anesthesia: General

## 2011-02-25 MED ORDER — MUPIROCIN 2 % EX OINT
TOPICAL_OINTMENT | Freq: Two times a day (BID) | CUTANEOUS | Status: DC
  Administered 2011-02-25: 15:00:00 via NASAL
  Filled 2011-02-25: qty 22

## 2011-02-25 MED ORDER — MUPIROCIN 2 % EX OINT
TOPICAL_OINTMENT | CUTANEOUS | Status: AC
  Filled 2011-02-25: qty 22

## 2011-02-25 SURGICAL SUPPLY — 61 items
APL SKNCLS STERI-STRIP NONHPOA (GAUZE/BANDAGES/DRESSINGS)
BANDAGE GAUZE ELAST BULKY 4 IN (GAUZE/BANDAGES/DRESSINGS) ×2 IMPLANT
BENZOIN TINCTURE PRP APPL 2/3 (GAUZE/BANDAGES/DRESSINGS) ×1 IMPLANT
BLADE ULTRA TIP 2M (BLADE) ×1 IMPLANT
BUR BARREL STRAIGHT FLUTE 4.0 (BURR) IMPLANT
BUR MATCHSTICK NEURO 3.0 LAGG (BURR) ×1 IMPLANT
CANISTER SUCTION 2500CC (MISCELLANEOUS) ×1 IMPLANT
CLOTH BEACON ORANGE TIMEOUT ST (SAFETY) ×1 IMPLANT
CONT SPEC 4OZ CLIKSEAL STRL BL (MISCELLANEOUS) ×1 IMPLANT
COVER MAYO STAND STRL (DRAPES) ×1 IMPLANT
DRAPE LAPAROTOMY 100X72 PEDS (DRAPES) ×1 IMPLANT
DRAPE MICROSCOPE LEICA (MISCELLANEOUS) ×1 IMPLANT
DRAPE POUCH INSTRU U-SHP 10X18 (DRAPES) ×1 IMPLANT
DRAPE PROXIMA HALF (DRAPES) IMPLANT
DURAPREP 6ML APPLICATOR 50/CS (WOUND CARE) ×1 IMPLANT
ELECT REM PT RETURN 9FT ADLT (ELECTROSURGICAL)
ELECTRODE REM PT RTRN 9FT ADLT (ELECTROSURGICAL) ×1 IMPLANT
GAUZE SPONGE 4X4 16PLY XRAY LF (GAUZE/BANDAGES/DRESSINGS) IMPLANT
GLOVE BIO SURGEON STRL SZ 6.5 (GLOVE) IMPLANT
GLOVE BIO SURGEON STRL SZ7 (GLOVE) IMPLANT
GLOVE BIO SURGEON STRL SZ7.5 (GLOVE) IMPLANT
GLOVE BIO SURGEON STRL SZ8 (GLOVE) IMPLANT
GLOVE BIO SURGEON STRL SZ8.5 (GLOVE) IMPLANT
GLOVE BIOGEL M 8.0 STRL (GLOVE) ×1 IMPLANT
GLOVE ECLIPSE 6.5 STRL STRAW (GLOVE) IMPLANT
GLOVE ECLIPSE 7.0 STRL STRAW (GLOVE) IMPLANT
GLOVE ECLIPSE 7.5 STRL STRAW (GLOVE) IMPLANT
GLOVE ECLIPSE 8.0 STRL XLNG CF (GLOVE) IMPLANT
GLOVE ECLIPSE 8.5 STRL (GLOVE) IMPLANT
GLOVE EXAM NITRILE LRG STRL (GLOVE) IMPLANT
GLOVE EXAM NITRILE MD LF STRL (GLOVE) IMPLANT
GLOVE EXAM NITRILE XL STR (GLOVE) IMPLANT
GLOVE EXAM NITRILE XS STR PU (GLOVE) IMPLANT
GLOVE INDICATOR 6.5 STRL GRN (GLOVE) IMPLANT
GLOVE INDICATOR 7.0 STRL GRN (GLOVE) IMPLANT
GLOVE INDICATOR 7.5 STRL GRN (GLOVE) IMPLANT
GLOVE INDICATOR 8.0 STRL GRN (GLOVE) IMPLANT
GLOVE INDICATOR 8.5 STRL (GLOVE) IMPLANT
GLOVE OPTIFIT SS 8.0 STRL (GLOVE) IMPLANT
GLOVE SURG SS PI 6.5 STRL IVOR (GLOVE) IMPLANT
GOWN BRE IMP SLV AUR LG STRL (GOWN DISPOSABLE) ×1 IMPLANT
GOWN BRE IMP SLV AUR XL STRL (GOWN DISPOSABLE) IMPLANT
GOWN STRL REIN 2XL LVL4 (GOWN DISPOSABLE) IMPLANT
HEMOSTAT POWDER KIT SURGIFOAM (HEMOSTASIS) IMPLANT
KIT BASIN OR (CUSTOM PROCEDURE TRAY) ×1 IMPLANT
KIT ROOM TURNOVER OR (KITS) ×1 IMPLANT
NDL SPNL 22GX3.5 QUINCKE BK (NEEDLE) ×1 IMPLANT
NEEDLE SPNL 22GX3.5 QUINCKE BK (NEEDLE) IMPLANT
NS IRRIG 1000ML POUR BTL (IV SOLUTION) ×1 IMPLANT
PACK LAMINECTOMY NEURO (CUSTOM PROCEDURE TRAY) ×1 IMPLANT
PATTIES SURGICAL .5 X1 (DISPOSABLE) ×1 IMPLANT
RUBBERBAND STERILE (MISCELLANEOUS) ×2 IMPLANT
SPONGE GAUZE 4X4 12PLY (GAUZE/BANDAGES/DRESSINGS) ×1 IMPLANT
SPONGE INTESTINAL PEANUT (DISPOSABLE) ×2 IMPLANT
SPONGE SURGIFOAM ABS GEL 100 (HEMOSTASIS) ×1 IMPLANT
STRIP CLOSURE SKIN 1/2X4 (GAUZE/BANDAGES/DRESSINGS) ×1 IMPLANT
SUT VIC AB 3-0 SH 8-18 (SUTURE) ×1 IMPLANT
SYR 20ML ECCENTRIC (SYRINGE) ×1 IMPLANT
TOWEL OR 17X24 6PK STRL BLUE (TOWEL DISPOSABLE) ×1 IMPLANT
TOWEL OR 17X26 10 PK STRL BLUE (TOWEL DISPOSABLE) ×1 IMPLANT
WATER STERILE IRR 1000ML POUR (IV SOLUTION) ×1 IMPLANT

## 2011-02-25 NOTE — Preoperative (Signed)
Beta Blockers   Reason not to administer Beta Blockers:Not Applicable 

## 2011-02-25 NOTE — Anesthesia Preprocedure Evaluation (Addendum)
Anesthesia Evaluation  Patient identified by MRN, date of birth, ID band Patient awake    Reviewed: Allergy & Precautions, H&P , NPO status , Patient's Chart, lab work & pertinent test results  Airway Mallampati: II TM Distance: >3 FB     Dental  (+) Teeth Intact   Pulmonary  clear to auscultation  Pulmonary exam normal       Cardiovascular hypertension, + CAD Normal    Neuro/Psych    GI/Hepatic   Endo/Other    Renal/GU      Musculoskeletal   Abdominal   Peds  Hematology   Anesthesia Other Findings   Reproductive/Obstetrics                          Anesthesia Physical Anesthesia Plan  ASA: III  Anesthesia Plan: General   Post-op Pain Management:    Induction: Intravenous  Airway Management Planned: Oral ETT  Additional Equipment:   Intra-op Plan:   Post-operative Plan: Extubation in OR  Informed Consent: I have reviewed the patients History and Physical, chart, labs and discussed the procedure including the risks, benefits and alternatives for the proposed anesthesia with the patient or authorized representative who has indicated his/her understanding and acceptance.   Dental advisory given  Plan Discussed with: CRNA and Surgeon  Anesthesia Plan Comments: (CAD S/P PTCA with stents last 2006. Cath 10/12 all stented vessels patent EF 50% Htn Chronic known LBBB Cervical spondylosis with radioculopathy and myelopathic symptoms.  Plan GA  Kipp Brood, MD)       Anesthesia Quick Evaluation

## 2011-02-26 ENCOUNTER — Other Ambulatory Visit: Payer: Self-pay | Admitting: Cardiology

## 2011-02-26 ENCOUNTER — Other Ambulatory Visit: Payer: Self-pay | Admitting: Neurosurgery

## 2011-02-26 ENCOUNTER — Encounter (HOSPITAL_COMMUNITY): Payer: Self-pay | Admitting: Pharmacy Technician

## 2011-03-02 ENCOUNTER — Encounter (HOSPITAL_COMMUNITY): Payer: Self-pay

## 2011-03-02 MED ORDER — CEFAZOLIN SODIUM-DEXTROSE 2-3 GM-% IV SOLR
2.0000 g | INTRAVENOUS | Status: AC
  Administered 2011-03-03: 2 g via INTRAVENOUS

## 2011-03-03 ENCOUNTER — Encounter (HOSPITAL_COMMUNITY)
Admission: RE | Disposition: A | Discharge status: Home / Self Care | Payer: Self-pay | Source: Ambulatory Visit | Attending: Neurosurgery

## 2011-03-03 ENCOUNTER — Ambulatory Visit (HOSPITAL_COMMUNITY)
Admission: RE | Admit: 2011-03-03 | Discharge: 2011-03-05 | DRG: 865 | Disposition: A | Discharge status: Home / Self Care | Payer: BC Managed Care – PPO | Source: Ambulatory Visit | Attending: Neurosurgery | Admitting: Neurosurgery

## 2011-03-03 ENCOUNTER — Ambulatory Visit (HOSPITAL_COMMUNITY): Payer: BC Managed Care – PPO

## 2011-03-03 ENCOUNTER — Encounter (HOSPITAL_COMMUNITY): Payer: Self-pay | Admitting: *Deleted

## 2011-03-03 ENCOUNTER — Ambulatory Visit (HOSPITAL_COMMUNITY): Payer: BC Managed Care – PPO | Admitting: Vascular Surgery

## 2011-03-03 ENCOUNTER — Encounter (HOSPITAL_COMMUNITY): Payer: Self-pay | Admitting: Vascular Surgery

## 2011-03-03 DIAGNOSIS — E7800 Pure hypercholesterolemia, unspecified: Secondary | ICD-10-CM | POA: Insufficient documentation

## 2011-03-03 DIAGNOSIS — G8929 Other chronic pain: Secondary | ICD-10-CM

## 2011-03-03 DIAGNOSIS — I447 Left bundle-branch block, unspecified: Secondary | ICD-10-CM | POA: Insufficient documentation

## 2011-03-03 DIAGNOSIS — I252 Old myocardial infarction: Secondary | ICD-10-CM | POA: Insufficient documentation

## 2011-03-03 DIAGNOSIS — I10 Essential (primary) hypertension: Secondary | ICD-10-CM | POA: Insufficient documentation

## 2011-03-03 DIAGNOSIS — M5030 Other cervical disc degeneration, unspecified cervical region: Secondary | ICD-10-CM | POA: Insufficient documentation

## 2011-03-03 DIAGNOSIS — I2510 Atherosclerotic heart disease of native coronary artery without angina pectoris: Secondary | ICD-10-CM | POA: Insufficient documentation

## 2011-03-03 DIAGNOSIS — Z9861 Coronary angioplasty status: Secondary | ICD-10-CM | POA: Insufficient documentation

## 2011-03-03 DIAGNOSIS — M47812 Spondylosis without myelopathy or radiculopathy, cervical region: Secondary | ICD-10-CM | POA: Insufficient documentation

## 2011-03-03 HISTORY — PX: ANTERIOR CERVICAL DECOMP/DISCECTOMY FUSION: SHX1161

## 2011-03-03 SURGERY — ANTERIOR CERVICAL DECOMPRESSION/DISCECTOMY FUSION 3 LEVELS
Anesthesia: General | Wound class: Clean

## 2011-03-03 MED ORDER — GLYCOPYRROLATE 0.2 MG/ML IJ SOLN
INTRAMUSCULAR | Status: DC | PRN
  Administered 2011-03-03: .5 mg via INTRAVENOUS

## 2011-03-03 MED ORDER — NEOSTIGMINE METHYLSULFATE 1 MG/ML IJ SOLN
INTRAMUSCULAR | Status: DC | PRN
  Administered 2011-03-03: 3 mg via INTRAVENOUS

## 2011-03-03 MED ORDER — MENTHOL 3 MG MT LOZG
1.0000 | LOZENGE | OROMUCOSAL | Status: DC | PRN
  Administered 2011-03-04: 3 mg via ORAL
  Filled 2011-03-03: qty 9

## 2011-03-03 MED ORDER — ONDANSETRON HCL 4 MG/2ML IJ SOLN
INTRAMUSCULAR | Status: DC | PRN
  Administered 2011-03-03: 4 mg via INTRAVENOUS

## 2011-03-03 MED ORDER — AMLODIPINE BESYLATE 5 MG PO TABS
5.0000 mg | ORAL_TABLET | Freq: Every day | ORAL | Status: DC
  Administered 2011-03-04: 5 mg via ORAL
  Filled 2011-03-03 (×3): qty 1

## 2011-03-03 MED ORDER — HYDROMORPHONE HCL PF 1 MG/ML IJ SOLN
0.2500 mg | INTRAMUSCULAR | Status: DC | PRN
  Administered 2011-03-03 (×4): 0.5 mg via INTRAVENOUS

## 2011-03-03 MED ORDER — OXYCODONE-ACETAMINOPHEN 5-325 MG PO TABS
1.0000 | ORAL_TABLET | ORAL | Status: DC | PRN
  Administered 2011-03-03 – 2011-03-05 (×7): 2 via ORAL
  Filled 2011-03-03 (×7): qty 2

## 2011-03-03 MED ORDER — HYDROMORPHONE HCL PF 1 MG/ML IJ SOLN
INTRAMUSCULAR | Status: AC
  Filled 2011-03-03: qty 1

## 2011-03-03 MED ORDER — SODIUM CHLORIDE 0.9 % IV SOLN
10.0000 mg | INTRAVENOUS | Status: DC | PRN
  Administered 2011-03-03: 1 ug/min via INTRAVENOUS

## 2011-03-03 MED ORDER — DIAZEPAM 5 MG PO TABS
ORAL_TABLET | ORAL | Status: AC
  Filled 2011-03-03: qty 1

## 2011-03-03 MED ORDER — RAMIPRIL 2.5 MG PO CAPS
2.5000 mg | ORAL_CAPSULE | Freq: Every day | ORAL | Status: DC
  Administered 2011-03-03 – 2011-03-04 (×2): 2.5 mg via ORAL
  Filled 2011-03-03 (×3): qty 1

## 2011-03-03 MED ORDER — LIDOCAINE HCL (CARDIAC) 20 MG/ML IV SOLN
INTRAVENOUS | Status: DC | PRN
  Administered 2011-03-03: 60 mg via INTRAVENOUS

## 2011-03-03 MED ORDER — ROCURONIUM BROMIDE 100 MG/10ML IV SOLN
INTRAVENOUS | Status: DC | PRN
  Administered 2011-03-03 (×2): 20 mg via INTRAVENOUS
  Administered 2011-03-03: 50 mg via INTRAVENOUS
  Administered 2011-03-03: 10 mg via INTRAVENOUS

## 2011-03-03 MED ORDER — THROMBIN 20000 UNITS EX KIT
PACK | OROMUCOSAL | Status: DC | PRN
  Administered 2011-03-03: 14:00:00 via TOPICAL

## 2011-03-03 MED ORDER — LORAZEPAM 2 MG/ML IJ SOLN: 1.0000 mg | Freq: Once | INTRAMUSCULAR | Status: DC | PRN

## 2011-03-03 MED ORDER — EPHEDRINE SULFATE 50 MG/ML IJ SOLN
INTRAMUSCULAR | Status: DC | PRN
  Administered 2011-03-03: 5 mg via INTRAVENOUS

## 2011-03-03 MED ORDER — DEXAMETHASONE SODIUM PHOSPHATE 10 MG/ML IJ SOLN
INTRAMUSCULAR | Status: DC | PRN
  Administered 2011-03-03: 8 mg via INTRAVENOUS

## 2011-03-03 MED ORDER — PROMETHAZINE HCL 25 MG/ML IJ SOLN: 6.2500 mg | INTRAMUSCULAR | Status: DC | PRN

## 2011-03-03 MED ORDER — DIAZEPAM 5 MG PO TABS
5.0000 mg | ORAL_TABLET | Freq: Four times a day (QID) | ORAL | Status: DC | PRN
  Administered 2011-03-03 – 2011-03-05 (×6): 5 mg via ORAL
  Filled 2011-03-03 (×5): qty 1

## 2011-03-03 MED ORDER — 0.9 % SODIUM CHLORIDE (POUR BTL) OPTIME
TOPICAL | Status: DC | PRN
  Administered 2011-03-03: 1000 mL

## 2011-03-03 MED ORDER — FENTANYL CITRATE 0.05 MG/ML IJ SOLN
INTRAMUSCULAR | Status: DC | PRN
  Administered 2011-03-03: 150 ug via INTRAVENOUS
  Administered 2011-03-03: 100 ug via INTRAVENOUS

## 2011-03-03 MED ORDER — MORPHINE SULFATE 4 MG/ML IJ SOLN
2.0000 mg | INTRAMUSCULAR | Status: DC | PRN
Start: 2011-03-03 — End: 2011-03-05
  Administered 2011-03-03 – 2011-03-04 (×5): 2 mg via INTRAVENOUS
  Filled 2011-03-03 (×5): qty 1

## 2011-03-03 MED ORDER — MIDAZOLAM HCL 5 MG/5ML IJ SOLN
INTRAMUSCULAR | Status: DC | PRN
  Administered 2011-03-03: 2 mg via INTRAVENOUS

## 2011-03-03 MED ORDER — FENTANYL CITRATE 0.05 MG/ML IJ SOLN: 50.0000 ug | INTRAMUSCULAR | Status: DC | PRN

## 2011-03-03 MED ORDER — ZOLPIDEM TARTRATE 5 MG PO TABS
10.0000 mg | ORAL_TABLET | Freq: Every evening | ORAL | Status: DC | PRN
  Administered 2011-03-04: 10 mg via ORAL
  Filled 2011-03-03: qty 2

## 2011-03-03 MED ORDER — SODIUM CHLORIDE 0.9 % IV SOLN
INTRAVENOUS | Status: DC
  Administered 2011-03-03 – 2011-03-04 (×2): via INTRAVENOUS

## 2011-03-03 MED ORDER — RAMIPRIL 2.5 MG PO TABS: 2.5000 mg | ORAL_TABLET | Freq: Every day | ORAL | Status: DC

## 2011-03-03 MED ORDER — LACTATED RINGERS IV SOLN
INTRAVENOUS | Status: DC | PRN
  Administered 2011-03-03 (×2): via INTRAVENOUS

## 2011-03-03 MED ORDER — ONDANSETRON HCL 4 MG/2ML IJ SOLN: 4.0000 mg | INTRAMUSCULAR | Status: DC | PRN

## 2011-03-03 MED ORDER — HEMOSTATIC AGENTS (NO CHARGE) OPTIME
TOPICAL | Status: DC | PRN
  Administered 2011-03-03: 1 via TOPICAL

## 2011-03-03 MED ORDER — THROMBIN 5000 UNITS EX SOLR
CUTANEOUS | Status: DC | PRN
  Administered 2011-03-03: 5000 [IU] via TOPICAL

## 2011-03-03 MED ORDER — ACETAMINOPHEN 325 MG PO TABS: 650.0000 mg | ORAL_TABLET | ORAL | Status: DC | PRN

## 2011-03-03 MED ORDER — PHENOL 1.4 % MT LIQD
1.0000 | OROMUCOSAL | Status: DC | PRN
  Administered 2011-03-04: 1 via OROMUCOSAL
  Filled 2011-03-03: qty 177

## 2011-03-03 MED ORDER — CEFAZOLIN SODIUM 1-5 GM-% IV SOLN
1.0000 g | Freq: Three times a day (TID) | INTRAVENOUS | Status: AC
  Administered 2011-03-03 – 2011-03-04 (×2): 1 g via INTRAVENOUS
  Filled 2011-03-03 (×2): qty 50

## 2011-03-03 MED ORDER — PHENYLEPHRINE HCL 10 MG/ML IJ SOLN
INTRAMUSCULAR | Status: DC | PRN
  Administered 2011-03-03: 80 ug via INTRAVENOUS
  Administered 2011-03-03 (×2): 40 ug via INTRAVENOUS

## 2011-03-03 MED ORDER — PROPOFOL 10 MG/ML IV EMUL
INTRAVENOUS | Status: DC | PRN
  Administered 2011-03-03: 160 mg via INTRAVENOUS

## 2011-03-03 MED ORDER — DEXAMETHASONE 4 MG PO TABS
4.0000 mg | ORAL_TABLET | Freq: Four times a day (QID) | ORAL | Status: DC
  Administered 2011-03-04 – 2011-03-05 (×4): 4 mg via ORAL
  Filled 2011-03-03 (×11): qty 1

## 2011-03-03 MED ORDER — MIDAZOLAM HCL 2 MG/2ML IJ SOLN: 1.0000 mg | INTRAMUSCULAR | Status: DC | PRN

## 2011-03-03 MED ORDER — SODIUM CHLORIDE 0.9 % IJ SOLN
3.0000 mL | Freq: Two times a day (BID) | INTRAMUSCULAR | Status: DC
  Administered 2011-03-04: 3 mL via INTRAVENOUS

## 2011-03-03 MED ORDER — DEXAMETHASONE SODIUM PHOSPHATE 4 MG/ML IJ SOLN
4.0000 mg | Freq: Four times a day (QID) | INTRAMUSCULAR | Status: DC
  Administered 2011-03-03 – 2011-03-04 (×3): 4 mg via INTRAVENOUS
  Filled 2011-03-03 (×11): qty 1

## 2011-03-03 MED ORDER — SODIUM CHLORIDE 0.9 % IJ SOLN: 3.0000 mL | INTRAMUSCULAR | Status: DC | PRN

## 2011-03-03 MED ORDER — ACETAMINOPHEN 650 MG RE SUPP: 650.0000 mg | RECTAL | Status: DC | PRN

## 2011-03-03 SURGICAL SUPPLY — 71 items
APL SKNCLS STERI-STRIP NONHPOA (GAUZE/BANDAGES/DRESSINGS) ×1
BANDAGE GAUZE ELAST BULKY 4 IN (GAUZE/BANDAGES/DRESSINGS) ×4 IMPLANT
BENZOIN TINCTURE PRP APPL 2/3 (GAUZE/BANDAGES/DRESSINGS) ×2 IMPLANT
BIT DRILL SM SPINE QC 14 (BIT) ×1 IMPLANT
BLADE ULTRA TIP 2M (BLADE) ×2 IMPLANT
BUR BARREL STRAIGHT FLUTE 4.0 (BURR) IMPLANT
BUR MATCHSTICK NEURO 3.0 LAGG (BURR) ×2 IMPLANT
CANISTER SUCTION 2500CC (MISCELLANEOUS) ×2 IMPLANT
CLOTH BEACON ORANGE TIMEOUT ST (SAFETY) ×2 IMPLANT
CONT SPEC 4OZ CLIKSEAL STRL BL (MISCELLANEOUS) ×2 IMPLANT
COVER MAYO STAND STRL (DRAPES) ×2 IMPLANT
DRAPE LAPAROTOMY 100X72 PEDS (DRAPES) ×2 IMPLANT
DRAPE MICROSCOPE LEICA (MISCELLANEOUS) ×2 IMPLANT
DRAPE POUCH INSTRU U-SHP 10X18 (DRAPES) ×2 IMPLANT
DRAPE PROXIMA HALF (DRAPES) ×1 IMPLANT
DURAPREP 6ML APPLICATOR 50/CS (WOUND CARE) ×2 IMPLANT
ELECT REM PT RETURN 9FT ADLT (ELECTROSURGICAL) ×2
ELECTRODE REM PT RTRN 9FT ADLT (ELECTROSURGICAL) ×1 IMPLANT
GAUZE SPONGE 4X4 16PLY XRAY LF (GAUZE/BANDAGES/DRESSINGS) IMPLANT
GLOVE BIO SURGEON STRL SZ 6.5 (GLOVE) IMPLANT
GLOVE BIO SURGEON STRL SZ7 (GLOVE) IMPLANT
GLOVE BIO SURGEON STRL SZ7.5 (GLOVE) IMPLANT
GLOVE BIO SURGEON STRL SZ8 (GLOVE) IMPLANT
GLOVE BIO SURGEON STRL SZ8.5 (GLOVE) IMPLANT
GLOVE BIOGEL M 8.0 STRL (GLOVE) ×2 IMPLANT
GLOVE BIOGEL PI IND STRL 7.0 (GLOVE) IMPLANT
GLOVE BIOGEL PI IND STRL 8 (GLOVE) IMPLANT
GLOVE BIOGEL PI INDICATOR 7.0 (GLOVE) ×1
GLOVE BIOGEL PI INDICATOR 8 (GLOVE) ×1
GLOVE ECLIPSE 6.5 STRL STRAW (GLOVE) IMPLANT
GLOVE ECLIPSE 7.0 STRL STRAW (GLOVE) IMPLANT
GLOVE ECLIPSE 7.5 STRL STRAW (GLOVE) ×1 IMPLANT
GLOVE ECLIPSE 8.0 STRL XLNG CF (GLOVE) IMPLANT
GLOVE ECLIPSE 8.5 STRL (GLOVE) IMPLANT
GLOVE EXAM NITRILE LRG STRL (GLOVE) IMPLANT
GLOVE EXAM NITRILE MD LF STRL (GLOVE) IMPLANT
GLOVE EXAM NITRILE XL STR (GLOVE) IMPLANT
GLOVE EXAM NITRILE XS STR PU (GLOVE) IMPLANT
GLOVE INDICATOR 6.5 STRL GRN (GLOVE) IMPLANT
GLOVE INDICATOR 7.0 STRL GRN (GLOVE) ×1 IMPLANT
GLOVE INDICATOR 7.5 STRL GRN (GLOVE) IMPLANT
GLOVE INDICATOR 8.0 STRL GRN (GLOVE) IMPLANT
GLOVE INDICATOR 8.5 STRL (GLOVE) ×1 IMPLANT
GLOVE OPTIFIT SS 8.0 STRL (GLOVE) IMPLANT
GLOVE SURG SS PI 6.5 STRL IVOR (GLOVE) IMPLANT
GOWN BRE IMP SLV AUR LG STRL (GOWN DISPOSABLE) ×3 IMPLANT
GOWN BRE IMP SLV AUR XL STRL (GOWN DISPOSABLE) IMPLANT
GOWN STRL REIN 2XL LVL4 (GOWN DISPOSABLE) ×1 IMPLANT
HEMOSTAT POWDER KIT SURGIFOAM (HEMOSTASIS) ×1 IMPLANT
KIT BASIN OR (CUSTOM PROCEDURE TRAY) ×2 IMPLANT
KIT ROOM TURNOVER OR (KITS) ×2 IMPLANT
NDL SPNL 22GX3.5 QUINCKE BK (NEEDLE) ×1 IMPLANT
NEEDLE SPNL 22GX3.5 QUINCKE BK (NEEDLE) ×2 IMPLANT
NS IRRIG 1000ML POUR BTL (IV SOLUTION) ×2 IMPLANT
PACK LAMINECTOMY NEURO (CUSTOM PROCEDURE TRAY) ×2 IMPLANT
PATTIES SURGICAL .5 X1 (DISPOSABLE) ×2 IMPLANT
PLATE ANT CERV XTEND 3 LV 51 (Plate) ×1 IMPLANT
PUTTY DBX 1CC (Putty) ×2 IMPLANT
PUTTY DBX 1CC DEPUY (Putty) IMPLANT
RUBBERBAND STERILE (MISCELLANEOUS) ×4 IMPLANT
SCREW XTD VAR 4.2 SELF TAP (Screw) ×8 IMPLANT
SPACER ACDF SM LORDOTIC 7 (Spacer) ×3 IMPLANT
SPONGE GAUZE 4X4 12PLY (GAUZE/BANDAGES/DRESSINGS) ×2 IMPLANT
SPONGE INTESTINAL PEANUT (DISPOSABLE) ×4 IMPLANT
SPONGE SURGIFOAM ABS GEL 100 (HEMOSTASIS) ×2 IMPLANT
STRIP CLOSURE SKIN 1/2X4 (GAUZE/BANDAGES/DRESSINGS) ×2 IMPLANT
SUT VIC AB 3-0 SH 8-18 (SUTURE) ×2 IMPLANT
SYR 20ML ECCENTRIC (SYRINGE) ×2 IMPLANT
TOWEL OR 17X24 6PK STRL BLUE (TOWEL DISPOSABLE) ×2 IMPLANT
TOWEL OR 17X26 10 PK STRL BLUE (TOWEL DISPOSABLE) ×2 IMPLANT
WATER STERILE IRR 1000ML POUR (IV SOLUTION) ×2 IMPLANT

## 2011-03-03 NOTE — H&P (Signed)
[name] is an 56 y.o. male.   Chief Complaint:  Cervical and lumbar pain. HPI: history of neck pain going to the left shoulder first and then to the right which is getting worse. The other complain is lumbar pain going to the right foot. Had failed with conservative treatment. Had an outpatient myelogram. Past Medical History  Diagnosis Date  . Hypertension   . Hypercholesterolemia   . LBBB (left bundle branch block)   . MI (myocardial infarction) 1993    LATERAL  . Varicose veins   . Arthritis   . S/P coronary artery stent placement     RCA  . Pneumonia   . Kidney stone     approx 5 years ago  . Coronary artery disease     Past Surgical History  Procedure Date  . Coronary angioplasty     DIRECT ANGIOPLASTY THE MARGINAL BRANCH  . Coronary stent placement     RCA  . Cardiac catheterization 2009    Stents in RCA patent. 70 to 80% PL, and 50% ostial PD.     Family History  Problem Relation Age of Onset  . Heart attack Father   . Heart disease Brother   . Breast cancer Mother   . Coronary artery disease Mother    Social History:  reports that he quit smoking about 5 years ago. He does not have any smokeless tobacco history on file. He reports that he does not drink alcohol or use illicit drugs.  Allergies:  Allergies  Allergen Reactions  . Ezetimibe Other (See Comments)    Joint pain and drops BP  . Niacin Other (See Comments)    Drops BP and severe  . Statins Other (See Comments)    Severe joint pain and drops BP.    Medications Prior to Admission  Medication Dose Route Frequency Provider Last Rate Last Dose  . ceFAZolin (ANCEF) IVPB 2 g/50 mL premix  2 g Intravenous 60 min Pre-Op Karn Cassis, MD      . DISCONTD: ceFAZolin (ANCEF) IVPB 2 g/50 mL premix  2 g Intravenous 60 min Pre-Op Karn Cassis, MD      . DISCONTD: mupirocin ointment (BACTROBAN) 2 %   Nasal BID Karn Cassis, MD      . DISCONTD: mupirocin ointment (BACTROBAN) 2 %             Medications Prior to Admission  Medication Sig Dispense Refill  . amLODipine (NORVASC) 5 MG tablet Take 5 mg by mouth daily.      Marland Kitchen aspirin 81 MG tablet Take 81 mg by mouth daily.        . fenofibrate (TRICOR) 145 MG tablet Take 145 mg by mouth daily.      . Flaxseed, Linseed, (FLAX SEED OIL PO) Take 1 capsule by mouth 3 (three) times daily.       . Multiple Vitamin (MULTIVITAMIN) tablet Take 1 tablet by mouth 2 (two) times daily.        Marland Kitchen oxyCODONE-acetaminophen (PERCOCET) 5-325 MG per tablet Take 1-2 tablets by mouth every 6 (six) hours as needed. For pain      . ramipril (ALTACE) 2.5 MG tablet Take 2.5 mg by mouth daily.          No results found for this or any previous visit (from the past 48 hour(s)). No results found.  Review of Systems  Constitutional: Negative.   HENT: Positive for neck pain.   Eyes: Negative.  Respiratory: Negative.   Cardiovascular:       Arterial hypertension,   Gastrointestinal: Negative.   Genitourinary: Negative.   Musculoskeletal: Positive for back pain.  Skin: Negative.   Neurological: Positive for focal weakness.  Endo/Heme/Allergies: Negative.   Psychiatric/Behavioral: Negative.     Blood pressure 133/83, pulse 71, temperature 97 F (36.1 C), temperature source Oral, resp. rate 18, SpO2 97.00%. Physical Exam hent, nl. Neck, decrease of flexibility with pain going to both shoulders. Lungs,nl. Cv, nl. Abdomen, nl. Extremities, nl. NEUR dtr,nl. Mild weakness of deltoids. Sensory,nl. slr positive in both legs at60. Myelogram, lumbar, grade 1 spondylolisthesis at l5-s1. Cervical, severe ddd at c34, 45 and 56. Borderline between 67 and c7 t1.  Assessment/Plananterior decompression from c3 to 6 with plate and cages. Later on will address the lumbar area. The surgery and risks were fully explained to him in my office. NO family members were present To or today  [name] M 03/03/2011, 10:01 AM

## 2011-03-03 NOTE — Progress Notes (Signed)
C3 TO 6 ANTERIOR FUSION DONE. OPERATIVE REPORT NIMBER [phone]

## 2011-03-03 NOTE — Progress Notes (Signed)
Orthopedic Tech Progress Note Patient Details:  [name] 1955/01/22 [id number] Viewed order on rn list Patient ID: [name], male   DOB: 1955-07-07, 56 y.o.   MRN: [id number]   [name] 03/03/2011, 4:18 PM 2

## 2011-03-03 NOTE — Transfer of Care (Signed)
Immediate Anesthesia Transfer of Care Note  Patient: [name]  Procedure(s) Performed: Procedure(s) (LRB): ANTERIOR CERVICAL DECOMPRESSION/DISCECTOMY FUSION 3 LEVELS (N/A)  Patient Location: PACU  Anesthesia Type: General  Level of Consciousness: awake, alert  and oriented  Airway & Oxygen Therapy: Patient Spontanous Breathing and Patient connected to nasal cannula oxygen  Post-op Assessment: Report given to PACU RN and Post -op Vital signs reviewed and stable  Post vital signs: Reviewed and stable  Complications: No apparent anesthesia complications

## 2011-03-03 NOTE — Progress Notes (Signed)
Patient schedule for a 3 level anterior cervical fusion. He was expecting also a lumbar l5 s1 fusion for spondylolisthesis. I did show him my notes about the need for lumbar fusion BUT because his neck pain was worse we did agree with cervical first and later on with the liumbar but never did i agree with both at the same sitting. I went out to look for his wife but i did not find her. Patient do not want to wait and i will be taking him for tha cervical fusion from c3 to c6

## 2011-03-03 NOTE — Discharge Instructions (Signed)
Wound Care °Leave incision open to air. °You may shower. °Do not scrub directly on incision.  °Leave steri-strips on neck.  They will fall off themselves. °Do not put any creams, lotions, or ointments on incision. °Activity °Walk each and every day, increasing distance each day. °No lifting greater than 5 lbs.  Avoid excessive neck motion. °No driving for 2 weeks; may ride as a passenger locally. °Wear neck brace at all times except when showering.  If provided soft collar, may wear for comfort unless otherwise instructed. °Diet °Resume your normal diet.  °Return to Work °Will be discussed at you follow up appointment. °Call Your Doctor If Any of These Occur °Redness, drainage, or swelling at the wound.  °Temperature greater than 101 degrees. °Severe pain not relieved by pain medication. °Increased difficulty swallowing. °Incision starts to come apart. °Follow Up Appt °Call today for appointment in 3-4 weeks ([phone]) or for problems.  If you have any hardware placed in your spine, you will need an x-ray before your appointment. ° °

## 2011-03-03 NOTE — Anesthesia Procedure Notes (Signed)
Procedure Name: Intubation Date/Time: 03/03/2011 1:42 PM Performed by: Caryn Bee Pre-anesthesia Checklist: Patient identified, Emergency Drugs available, Suction available, Patient being monitored and Timeout performed Patient Re-evaluated:Patient Re-evaluated prior to inductionOxygen Delivery Method: Circle system utilized Preoxygenation: Pre-oxygenation with 100% oxygen Intubation Type: IV induction Ventilation: Mask ventilation without difficulty Laryngoscope Size: Mac and 4 Grade View: Grade II Tube type: Oral Tube size: 7.5 mm Number of attempts: 1 Airway Equipment and Method: Stylet Placement Confirmation: ETT inserted through vocal cords under direct vision,  positive ETCO2 and breath sounds checked- equal and bilateral Secured at: 23 cm Tube secured with: Tape Dental Injury: Teeth and Oropharynx as per pre-operative assessment

## 2011-03-03 NOTE — Anesthesia Postprocedure Evaluation (Signed)
  Anesthesia Post-op Note  Patient: [name]  Procedure(s) Performed: Procedure(s) (LRB): ANTERIOR CERVICAL DECOMPRESSION/DISCECTOMY FUSION 3 LEVELS (N/A)  Patient Location: PACU  Anesthesia Type: General  Level of Consciousness: awake  Airway and Oxygen Therapy: Patient Spontanous Breathing  Post-op Pain: mild  Post-op Assessment: Post-op Vital signs reviewed, Patient's Cardiovascular Status Stable, Respiratory Function Stable, Patent Airway, No signs of Nausea or vomiting and Pain level controlled  Post-op Vital Signs: stable  Complications: No apparent anesthesia complications

## 2011-03-04 MED ORDER — HYDROMORPHONE HCL 2 MG PO TABS
2.0000 mg | ORAL_TABLET | ORAL | Status: DC | PRN
  Administered 2011-03-04 (×3): 2 mg via ORAL
  Filled 2011-03-04 (×4): qty 1

## 2011-03-04 NOTE — Progress Notes (Signed)
Patient ID: [name], male   DOB: [birth year], 56 y.o.   MRN: [id number] Stable.no weakness. Pain between shoulders.

## 2011-03-04 NOTE — Progress Notes (Signed)
C/o "sore throat" medicated with throat loz and throat spray, valium 5mg  given for muscle spasms to back of neck/shoulders

## 2011-03-04 NOTE — Op Note (Signed)
NAMEAVROM, ROBARTS NO.:  [id number]  MEDICAL RECORD NO.:  [id number]  LOCATION:  3523                         FACILITY:  [facility]  PHYSICIAN:  Hilda Lias, M.D.   DATE OF BIRTH:  [birth year]  DATE OF PROCEDURE:  03/03/2011 DATE OF DISCHARGE:                              OPERATIVE REPORT   PREOPERATIVE DIAGNOSES: 1. Cervical spondylosis, C3-4, C4-5, C5-6, with bilateral chronic     radiculopathy.  Degenerative disk disease. 2. L5-S1 grade 1 spondylolisthesis, lumbar.  POSTOPERATIVE DIAGNOSES: 1. Cervical spondylosis, C3-4, C4-5, C5-6, with bilateral chronic     radiculopathy.  Degenerative disk disease. 2. L5-S1 grade 1 spondylolisthesis, lumbar.  PROCEDURE:  Anterior C3-4, C4-5, C5-6 diskectomy, decompression of the spinal cord, bilateral foraminotomy, interbody fusion with cages, plate from C3 to C6.  Microscope.  SURGEON:  Hilda Lias, MD  ASSISTANT:  Stefani Dama, MD  CLINICAL HISTORY:  Mr. Lotito is a 56 year old gentleman, seen in my office because of both complaints:  One is neck pain, radiation to both shoulders bilaterally, up to the point that the patient is feeling that he cannot sleep because of the pain.  Also, he has a back pain, radiation to both legs.  We did x-ray, which shows severe spondylosis between C3-4, C4-5, C5-6 and borderline between C6-7, C7-1.  He also has a grade 1 spondylolisthesis at the level of L5-S1.  The patient want to proceed with surgery.  The patient knew beforehand that the procedure would be decompression of the cervical spine 1st, and later on once he improve, we can do in 6-8 weeks, the lumbar fusion.  The patient was given last week where we have to postpone because of delay in the prior case.  Nevertheless, when although he signed the consent, when he was surprised when I told him about the neck surgery.  He expected me to do both cervical and lumbar.  I told him that, that was not feasible.   The problem we can cancel and talk again with him and his wife. Nevertheless, he wants to proceed with surgery of the cervical spine.  I went out to look for his wife, but I was unable to find her.  PROCEDURE:  The patient was taken to the OR, and after intubation, the left side of the neck was cleaned with DuraPrep.  Traction with 5 pounds was done.  Then, transverse incision was done through the skin, subcutaneous tissue, platysma straight down to the cervical spine.  The patient had quite a bit of large osteophyte.  X-rays showed that we were at the level of C4-5.  Then with the Leksell, we removed the large anterior osteophyte at C3-4, C4-5, and C5-6.  We started working our way 1st at the level of C4-5.  The patient had almost no space.  We had to drill all our way posteriorly.  The patient had quite a bit of calcification of the posterior ligament.  Incision of the posterior ligament was made in the midline and with the drill and the Kerrison punch, we were able to decompress the spinal cord on both C5 nerve roots.  At the level C5-6, we found the same  problem with quite a bit of stenosis.  In this case, the left side was worse than right side.  At the level of C3-4, we found spondylosis with a quite a bit of degenerative disk disease.  Then, the area was irrigated.  We probed the area again, and we found quite a bit of space for the spinal cord and the nerve root.  Then, 3 cages of 7-mm height, lordotic with autograft, BDX inside were inserted.  This was followed by a plate from C3 to C6.  We had to reposition the open right screw.  At the end, we had good position of the plate in the case.  The area was irrigated.  We waited 10 minutes just to be sure no we achieved good hemostasis.  Once this was done, the wound was closed with Vicryl and Steri-Strips.          ______________________________ Hilda Lias, M.D.     EB/MEDQ  D:  03/03/2011  T:  03/04/2011  Job:   [id number]

## 2011-03-05 NOTE — Discharge Summary (Signed)
Physician Discharge Summary  Patient ID: [name] MRN: [id number] DOB/AGE: 57-Jan-1957 57 y.o.  Admit date: 03/03/2011 Discharge date: 03/05/2011  Admission Diagnoses:cervical stenosis  Discharge Diagnoses: same Active Problems:  * No active hospital problems. *    Discharged Condition:no weakness  Hospital Course: cervical fusion 3 to 6  Consults: none  Significant Diagnostic Studies:  mri Treatments: surgery Discharge Exam: Blood pressure 148/71, pulse 70, temperature 97.7 F (36.5 C), temperature source Oral, resp. rate 20, SpO2 99.00%.  No pin, no weakness Disposition: 01-Home or Self Care home  Medication List  As of 03/05/2011  9:02 AM   ASK your doctor about these medications         amLODipine 5 MG tablet   Commonly known as: NORVASC   Take 5 mg by mouth daily.      aspirin 81 MG tablet   Take 81 mg by mouth daily.      fenofibrate 145 MG tablet   Commonly known as: TRICOR   Take 145 mg by mouth daily.      FLAX SEED OIL PO   Take 1 capsule by mouth 3 (three) times daily.      indomethacin 50 MG capsule   Commonly known as: INDOCIN   Take 50 mg by mouth 2 (two) times daily with a meal.      multivitamin tablet   Take 1 tablet by mouth 2 (two) times daily.      oxyCODONE-acetaminophen 5-325 MG per tablet   Commonly known as: PERCOCET   Take 1-2 tablets by mouth every 6 (six) hours as needed. For pain      ramipril 2.5 MG tablet   Commonly known as: ALTACE   Take 2.5 mg by mouth daily.             Signed: Karn Cassis 03/05/2011, 9:02 AM

## 2011-03-09 ENCOUNTER — Encounter (HOSPITAL_COMMUNITY): Payer: Self-pay | Admitting: Neurosurgery

## 2011-08-11 ENCOUNTER — Other Ambulatory Visit: Payer: Self-pay | Admitting: Cardiology

## 2011-09-22 ENCOUNTER — Other Ambulatory Visit: Payer: Self-pay | Admitting: Neurosurgery

## 2011-10-06 HISTORY — PX: POSTERIOR LUMBAR FUSION: SHX6036

## 2011-10-07 ENCOUNTER — Encounter (HOSPITAL_COMMUNITY)
Admission: RE | Admit: 2011-10-07 | Discharge: 2011-10-07 | Disposition: A | Discharge status: Home / Self Care | Payer: BC Managed Care – PPO | Source: Ambulatory Visit | Attending: Neurosurgery | Admitting: Neurosurgery

## 2011-10-07 ENCOUNTER — Encounter (HOSPITAL_COMMUNITY): Payer: Self-pay

## 2011-10-07 DIAGNOSIS — I10 Essential (primary) hypertension: Secondary | ICD-10-CM | POA: Insufficient documentation

## 2011-10-07 DIAGNOSIS — Z01818 Encounter for other preprocedural examination: Secondary | ICD-10-CM | POA: Insufficient documentation

## 2011-10-07 DIAGNOSIS — I517 Cardiomegaly: Secondary | ICD-10-CM | POA: Insufficient documentation

## 2011-10-07 HISTORY — DX: Heart failure, unspecified: I50.9

## 2011-10-07 LAB — BASIC METABOLIC PANEL
BUN: 20 mg/dL (ref 6–23)
CO2: 30 mEq/L (ref 19–32)
Calcium: 9.7 mg/dL (ref 8.4–10.5)
Chloride: 105 mEq/L (ref 96–112)
Creatinine, Ser: 1.18 mg/dL (ref 0.50–1.35)
GFR calc Af Amer: 77 mL/min — ABNORMAL LOW (ref >90)
GFR calc non Af Amer: 67 mL/min — ABNORMAL LOW (ref >90)
Glucose, Bld: 93 mg/dL (ref 70–99)
Potassium: 5.2 mEq/L — ABNORMAL HIGH (ref 3.5–5.1)
Sodium: 141 mEq/L (ref 135–145)

## 2011-10-07 LAB — CBC
HCT: 44.1 % (ref 39.0–52.0)
Hemoglobin: 13.7 g/dL (ref 13.0–17.0)
MCH: 29.7 pg (ref 26.0–34.0)
MCHC: 31.1 g/dL (ref 30.0–36.0)
MCV: 95.5 fL (ref 78.0–100.0)
Platelets: 209 10*3/uL (ref 150–400)
RBC: 4.62 MIL/uL (ref 4.22–5.81)
RDW: 13.7 % (ref 11.5–15.5)
WBC: 6.1 10*3/uL (ref 4.0–10.5)

## 2011-10-07 LAB — SURGICAL PCR SCREEN
MRSA, PCR: NEGATIVE
Staphylococcus aureus: NEGATIVE

## 2011-10-07 NOTE — Pre-Procedure Instructions (Signed)
[address]  10/07/2011   Your procedure is scheduled on:  Friday October 16, 2011.  Report to [facility] at 0530 AM.  Call this number if you have problems the morning of surgery: [phone]   Remember:   Do not eat food or drink:After Midnight.    Take these medicines the morning of surgery with A SIP OF WATER: Amlodipine (Norvasc), and Oxycodone (Percocet) if needed for pain    Do not wear jewelry  Do not wear lotions or colognes.  Men may shave face and neck.  Do not bring valuables to the hospital.  Contacts, dentures or bridgework may not be worn into surgery.  Leave suitcase in the car. After surgery it may be brought to your room.  For patients admitted to the hospital, checkout time is 11:00 AM the day of discharge.   Patients discharged the day of surgery will not be allowed to drive home.  Name and phone number of your driver:   Special Instructions: Shower using CHG 2 nights before surgery and the night before surgery.  If you shower the day of surgery use CHG.  Use special wash - you have one bottle of CHG for all showers.  You should use approximately 1/3 of the bottle for each shower.   Please read over the following fact sheets that you were given: Pain Booklet, Coughing and Deep Breathing, Blood Transfusion Information, MRSA Information and Surgical Site Infection Prevention

## 2011-10-07 NOTE — Progress Notes (Signed)
Patient informed Nurse that he had a stress test, and cardiac cath within the last year with Dr. [place] at [facility]. Patient denied having a sleep study. Patient refused to wear blood bank band because he informed Nurse that he had a "photo shoot" this Saturday. Will order type and screen for DOS.

## 2011-10-08 ENCOUNTER — Encounter (HOSPITAL_COMMUNITY): Payer: Self-pay | Admitting: Vascular Surgery

## 2011-10-08 NOTE — Consult Note (Signed)
Anesthesia chart review: Patient is a 56 year old male scheduled for L5-S1 discectomy, cages, pedicle screws, posterior lateral arthrodesis by Dr. Jeral Fruit on 10/16/2011.  History includes chronic left bundle-branch block, hypertension, former smoker, hypercholesterolemia, CAD/MI s/p multiple RCA stents '09, nephrolithiasis, PNA.  He is s/p C3-6 ACDF 03/03/11.  Cardiologist is Dr. Swaziland.    EKG on 02/25/11 showed SR with first degree AVB, left BBB. Appears stable from prior EKG on 10/08/10.  Cardiac cath on 10/07/10 showed: 1. Patency of multiple stents in the right coronary artery.  2. Nonobstructive disease of the left circumflex (40-50%) and left anterior descending (30%) coronary artery.  3. Mild segmental left ventricular systolic dysfunction.  Mild hypokinesis of the inferior wall. The overall LV ejection fraction is preserved at 50%. The other LV walls  contract normally. Continued medical therapy was recommended.  By 10/07/10 office note, he had an echo that day (? bedside as no formal report is noted) that showed no vegetation, some inferior hypokinesis.  Nuclear stress test on 03/05/10 showed: Partially reversible basal to mid inferior perfusion defect suggesting infarction with component of peri-infarction ischemia.  Overall Impression Comments: Mildly decreased LV systolic function with inferior hypokinesis. EF 44%.  (By notes, it was felt unchanged since 2008.)   Chest x-ray on 10/07/2011 showed stable, mild cardiac cardiac enlargement, no active cardiopulmonary disease.  Labs noted. He is for a T&S on the day of surgery.  If no significant change in his status then anticipate he can proceed as planned.  Shonna Chock, PA-C

## 2011-10-16 ENCOUNTER — Encounter (HOSPITAL_COMMUNITY): Admission: RE | Payer: Self-pay | Source: Ambulatory Visit

## 2011-10-16 ENCOUNTER — Ambulatory Visit (HOSPITAL_COMMUNITY): Admission: RE | Admit: 2011-10-16 | Payer: BC Managed Care – PPO | Source: Ambulatory Visit | Admitting: Neurosurgery

## 2011-10-16 SURGERY — POSTERIOR LUMBAR FUSION 1 LEVEL
Anesthesia: General | Site: Back

## 2011-10-21 ENCOUNTER — Other Ambulatory Visit: Payer: Self-pay | Admitting: Cardiology

## 2011-12-15 ENCOUNTER — Encounter: Payer: Self-pay | Admitting: Cardiology

## 2011-12-15 ENCOUNTER — Ambulatory Visit (INDEPENDENT_AMBULATORY_CARE_PROVIDER_SITE_OTHER): Payer: BC Managed Care – PPO | Admitting: Cardiology

## 2011-12-15 ENCOUNTER — Other Ambulatory Visit: Payer: Self-pay

## 2011-12-15 VITALS — BP 124/78 | HR 86 | Ht 75.0 in | Wt 215.8 lb

## 2011-12-15 DIAGNOSIS — I10 Essential (primary) hypertension: Secondary | ICD-10-CM

## 2011-12-15 DIAGNOSIS — E7800 Pure hypercholesterolemia, unspecified: Secondary | ICD-10-CM

## 2011-12-15 DIAGNOSIS — I2510 Atherosclerotic heart disease of native coronary artery without angina pectoris: Secondary | ICD-10-CM

## 2011-12-15 MED ORDER — FENOFIBRATE 145 MG PO TABS: 145.0000 mg | ORAL_TABLET | Freq: Every day | ORAL | Status: DC

## 2011-12-15 NOTE — Patient Instructions (Addendum)
Continue your current therapy  We will schedule you for fasting lab work  I will see you in 6 months. 

## 2011-12-15 NOTE — Progress Notes (Signed)
[name] Date of Birth: [birth year]   History of Present Illness: [name] is seen back today for a followup visit. He has a history of coronary disease and is status post stenting of the right coronary. He underwent cardiac catheterization in October 2012 which showed continued patency of the stents and nonobstructive coronary disease. In February of this year he underwent cervical neck surgery. After this his shoulder discomfort and chest discomfort resolved. He is scheduled to have lumbar surgery in the next couple of weeks. He has been experiencing some night sweats and feels a little bit short of breath. He's had no cough. He denies any chest pain. He has lost 10 pounds this past year.  Current Outpatient Prescriptions on File Prior to Visit  Medication Sig Dispense Refill  . amLODipine (NORVASC) 5 MG tablet Take 5 mg by mouth daily.      Marland Kitchen aspirin 81 MG tablet Take 81 mg by mouth daily.        . Flaxseed, Linseed, (FLAX SEED OIL PO) Take 1 capsule by mouth 3 (three) times daily.       Marland Kitchen HYDROcodone-acetaminophen (NORCO) 10-325 MG per tablet Take 1 tablet by mouth every 6 (six) hours as needed. For pain      . meloxicam (MOBIC) 15 MG tablet Take 15 mg by mouth daily.      . Multiple Vitamin (MULTIVITAMIN) tablet Take 1 tablet by mouth 2 (two) times daily.        . ramipril (ALTACE) 2.5 MG tablet Take 2.5 mg by mouth daily.        Marland Kitchen zolpidem (AMBIEN) 10 MG tablet Take 10 mg by mouth at bedtime as needed. For sleep      . [DISCONTINUED] fenofibrate (TRICOR) 145 MG tablet TAKE 1 TABLET BY MOUTH DAILY.  30 tablet  2  . [DISCONTINUED] fenofibrate (TRICOR) 145 MG tablet Take 145 mg by mouth daily.        Allergies  Allergen Reactions  . Ezetimibe Other (See Comments)    Joint pain and drops BP  . Niacin Other (See Comments)    Drops BP and severe  . Statins Other (See Comments)    Severe joint pain and drops BP.    Past Medical History  Diagnosis Date  . Hypertension   .  Hypercholesterolemia   . LBBB (left bundle branch block)   . MI (myocardial infarction) 1993    LATERAL  . Varicose veins   . Arthritis   . S/P coronary artery stent placement     RCA  . Pneumonia   . Kidney stone     approx 5 years ago  . Coronary artery disease   . CHF (congestive heart failure)     Past Surgical History  Procedure Date  . Coronary angioplasty     DIRECT ANGIOPLASTY THE MARGINAL BRANCH  . Coronary stent placement     RCA  . Cardiac catheterization 2009    Stents in RCA patent. 70 to 80% PL, and 50% ostial PD.   Marland Kitchen Anterior cervical decomp/discectomy fusion 03/03/2011    Procedure: ANTERIOR CERVICAL DECOMPRESSION/DISCECTOMY FUSION 3 LEVELS;  Surgeon: Karn Cassis, MD;  Location: [facility];  Service: Neurosurgery;  Laterality: N/A;  Cervical three-four Cervical four-five Cervical five-six Anterior cervical decompression/diskectomy, fusion    History  Smoking status  . Former Smoker  . Quit date: 05/06/2005  Smokeless tobacco  . Not on file    History  Alcohol Use  .  3.6 oz/week  . 6 Cans of beer per week    Comment: weekly    Family History  Problem Relation Age of Onset  . Heart attack Father   . Heart disease Brother   . Breast cancer Mother   . Coronary artery disease Mother     Review of Systems: The review of systems is per the HPI.  All other systems were reviewed and are negative.  Physical Exam: BP 124/78  Pulse 86  Ht 6\' 3"  (1.905 m)  Wt 215 lb 12.8 oz (97.886 kg)  BMI 26.97 kg/m2  SpO2 94% Patient is pleasant and in no acute distress. Skin is warm and dry. Color is normal.  HEENT is unremarkable. Normocephalic/atraumatic. PERRL. Sclera are nonicteric. Neck is supple. No masses. No JVD. Lungs are clear. Cardiac exam shows a regular rate and rhythm. No chest wall pain. Abdomen is soft. Extremities are without edema. Gait and ROM are intact. No gross neurologic deficits noted.   LABORATORY DATA:   Assessment / Plan: 1.  Coronary disease status post stenting of the right coronary. Cardiac catheterization October 2012 showed nonobstructive disease. We will continue risk factor modification.  2. Hypercholesterolemia. Patient is intolerant to multiple medications. He is on fenofibrate. We will schedule him for fasting lab work including chemistries and lipid panel.  3. Hypertension, controlled.  4. Night sweats. We'll check a CBC.

## 2011-12-16 ENCOUNTER — Encounter (HOSPITAL_COMMUNITY): Payer: Self-pay | Admitting: Pharmacy Technician

## 2011-12-16 ENCOUNTER — Other Ambulatory Visit: Payer: Self-pay | Admitting: Neurosurgery

## 2011-12-17 ENCOUNTER — Other Ambulatory Visit (INDEPENDENT_AMBULATORY_CARE_PROVIDER_SITE_OTHER): Payer: BC Managed Care – PPO

## 2011-12-17 DIAGNOSIS — I10 Essential (primary) hypertension: Secondary | ICD-10-CM

## 2011-12-17 DIAGNOSIS — E7800 Pure hypercholesterolemia, unspecified: Secondary | ICD-10-CM

## 2011-12-17 LAB — BASIC METABOLIC PANEL
BUN: 26 mg/dL — ABNORMAL HIGH (ref 6–23)
CO2: 28 mEq/L (ref 19–32)
Calcium: 9.6 mg/dL (ref 8.4–10.5)
Chloride: 102 mEq/L (ref 96–112)
Creatinine, Ser: 1.2 mg/dL (ref 0.4–1.5)
GFR: 64.32 mL/min (ref >60.00)
Glucose, Bld: 91 mg/dL (ref 70–99)
Potassium: 4.1 mEq/L (ref 3.5–5.1)
Sodium: 137 mEq/L (ref 135–145)

## 2011-12-17 LAB — CBC WITH DIFFERENTIAL/PLATELET
Basophils Absolute: 0 10*3/uL (ref 0.0–0.1)
Basophils Relative: 0.3 % (ref 0.0–3.0)
Eosinophils Absolute: 0.1 10*3/uL (ref 0.0–0.7)
Eosinophils Relative: 1.4 % (ref 0.0–5.0)
HCT: 43.7 % (ref 39.0–52.0)
Hemoglobin: 14.7 g/dL (ref 13.0–17.0)
Lymphocytes Relative: 27.2 % (ref 12.0–46.0)
Lymphs Abs: 1.7 10*3/uL (ref 0.7–4.0)
MCHC: 33.5 g/dL (ref 30.0–36.0)
MCV: 94.2 fl (ref 78.0–100.0)
Monocytes Absolute: 0.5 10*3/uL (ref 0.1–1.0)
Monocytes Relative: 8.9 % (ref 3.0–12.0)
Neutro Abs: 3.8 10*3/uL (ref 1.4–7.7)
Neutrophils Relative %: 62.2 % (ref 43.0–77.0)
Platelets: 272 10*3/uL (ref 150.0–400.0)
RBC: 4.65 Mil/uL (ref 4.22–5.81)
RDW: 13.9 % (ref 11.5–14.6)
WBC: 6.1 10*3/uL (ref 4.5–10.5)

## 2011-12-17 LAB — HEPATIC FUNCTION PANEL
ALT: 30 U/L (ref 0–53)
AST: 26 U/L (ref 0–37)
Albumin: 4.4 g/dL (ref 3.5–5.2)
Alkaline Phosphatase: 29 U/L — ABNORMAL LOW (ref 39–117)
Bilirubin, Direct: 0.1 mg/dL (ref 0.0–0.3)
Total Bilirubin: 1.1 mg/dL (ref 0.3–1.2)
Total Protein: 7.3 g/dL (ref 6.0–8.3)

## 2011-12-17 LAB — LIPID PANEL
Cholesterol: 165 mg/dL (ref 0–200)
HDL: 54.1 mg/dL (ref >39.00)
LDL Cholesterol: 100 mg/dL — ABNORMAL HIGH (ref 0–99)
Total CHOL/HDL Ratio: 3
Triglycerides: 55 mg/dL (ref 0.0–149.0)
VLDL: 11 mg/dL (ref 0.0–40.0)

## 2011-12-17 MED ORDER — LIDOCAINE-EPINEPHRINE (PF) 1 %-1:200000 IJ SOLN
INTRAMUSCULAR | Status: AC
  Filled 2011-12-17: qty 10

## 2011-12-17 MED ORDER — HEPARIN SODIUM (PORCINE) 1000 UNIT/ML IJ SOLN
INTRAMUSCULAR | Status: AC
  Filled 2011-12-17: qty 1

## 2011-12-21 ENCOUNTER — Encounter (HOSPITAL_COMMUNITY): Payer: Self-pay

## 2011-12-21 ENCOUNTER — Encounter (HOSPITAL_COMMUNITY)
Admission: RE | Admit: 2011-12-21 | Discharge: 2011-12-21 | Disposition: A | Discharge status: Home / Self Care | Payer: BC Managed Care – PPO | Source: Ambulatory Visit | Attending: Neurosurgery | Admitting: Neurosurgery

## 2011-12-21 LAB — ABO/RH: ABO/RH(D): AB POS

## 2011-12-21 LAB — BASIC METABOLIC PANEL
BUN: 22 mg/dL (ref 6–23)
CO2: 26 mEq/L (ref 19–32)
Calcium: 9.8 mg/dL (ref 8.4–10.5)
Chloride: 103 mEq/L (ref 96–112)
Creatinine, Ser: 1.05 mg/dL (ref 0.50–1.35)
GFR calc Af Amer: 89 mL/min — ABNORMAL LOW (ref >90)
GFR calc non Af Amer: 77 mL/min — ABNORMAL LOW (ref >90)
Glucose, Bld: 90 mg/dL (ref 70–99)
Potassium: 4.7 mEq/L (ref 3.5–5.1)
Sodium: 140 mEq/L (ref 135–145)

## 2011-12-21 LAB — CBC
HCT: 40.9 % (ref 39.0–52.0)
Hemoglobin: 13.5 g/dL (ref 13.0–17.0)
MCH: 30.9 pg (ref 26.0–34.0)
MCHC: 33 g/dL (ref 30.0–36.0)
MCV: 93.6 fL (ref 78.0–100.0)
Platelets: 264 10*3/uL (ref 150–400)
RBC: 4.37 MIL/uL (ref 4.22–5.81)
RDW: 13.5 % (ref 11.5–15.5)
WBC: 5.1 10*3/uL (ref 4.0–10.5)

## 2011-12-21 LAB — SURGICAL PCR SCREEN
MRSA, PCR: NEGATIVE
Staphylococcus aureus: NEGATIVE

## 2011-12-21 LAB — TYPE AND SCREEN
ABO/RH(D): AB POS
Antibody Screen: NEGATIVE

## 2011-12-21 NOTE — Pre-Procedure Instructions (Signed)
19 [name]  12/21/2011   Your procedure is scheduled on:  Friday December 20 at 1315 PM  Report to [facility] at 1015 AM.  Call this number if you have problems the morning of surgery: [phone]   Remember:   Do not eat food or drink:After Midnight.Thursday    Take these medicines the morning of surgery with A SIP OF WATER: Amlodipine, Diazepam, Oxycodone if needed   Do not wear jewelry.  Do not wear lotion. You may wear deodorant.  Do not shave 48 hours prior to surgery. Men may shave face and neck.  Do not bring valuables to the hospital.  Contacts, dentures or bridgework may not be worn into surgery.  Leave suitcase in the car. After surgery it may be brought to your room.  For patients admitted to the hospital, checkout time is 11:00 AM the day of discharge.   Patients discharged the day of surgery will not be allowed to drive home.    Special Instructions: Shower using CHG 2 nights before surgery and the night before surgery.  If you shower the day of surgery use CHG.  Use special wash - you have one bottle of CHG for all showers.  You should use approximately 1/3 of the bottle for each shower.   Please read over the following fact sheets that you were given: Pain Booklet, Coughing and Deep Breathing, Blood Transfusion Information, MRSA Information and Surgical Site Infection Prevention

## 2011-12-22 NOTE — Consult Note (Signed)
Anesthesia chart review: Patient is a 56 year old male scheduled for L5-S1 discectomy, cages, pedicle screws, posterior lateral arthrodesis by Dr. Jeral Fruit on 12/25/11.  (It was rescheduled from 10/16/2011 for unknown reasons.)   History includes chronic left bundle-branch block, hypertension, former smoker, hypercholesterolemia, CAD/MI s/p multiple RCA stents '09, nephrolithiasis, PNA. He is s/p C3-6 ACDF 03/03/11.   Cardiologist is Dr. Swaziland, last visit on 12/15/11.  By notes, he is aware of planned surgery.  Continued medical therapy for CAD was recommended.   EKG on 02/25/11 showed SR with first degree AVB, left BBB. Appears stable from prior EKG on 10/08/10.   Cardiac cath on 10/07/10 showed:  1. Patency of multiple stents in the right coronary artery.  2. Nonobstructive disease of the left circumflex (40-50%) and left anterior descending (30%) coronary artery.  3. Mild segmental left ventricular systolic dysfunction. Mild hypokinesis of the inferior wall. The overall LV ejection fraction is preserved at 50%. The other LV walls  contract normally.  Continued medical therapy was recommended.  By 10/07/10 office note, he had an echo that day (? bedside as no formal report is noted) that showed no vegetation, some inferior hypokinesis.   Nuclear stress test on 03/05/10 showed:  Partially reversible basal to mid inferior perfusion defect suggesting infarction with component of peri-infarction ischemia. Overall Impression Comments: Mildly decreased LV systolic function with inferior hypokinesis. EF 44%. (By notes, it was felt unchanged since 2008.)   Chest x-ray on 10/07/2011 showed stable, mild cardiac cardiac enlargement, no active cardiopulmonary disease.   Labs from 12/17/11 and 12/21/11 noted.   If no significant change in his status then anticipate he can proceed as planned.   Shonna Chock, PA-C 12/22/11 1712

## 2011-12-24 MED ORDER — CEFAZOLIN SODIUM-DEXTROSE 2-3 GM-% IV SOLR
2.0000 g | INTRAVENOUS | Status: AC
  Administered 2011-12-25: 2 g via INTRAVENOUS

## 2011-12-25 ENCOUNTER — Encounter (HOSPITAL_COMMUNITY): Payer: Self-pay | Admitting: Vascular Surgery

## 2011-12-25 ENCOUNTER — Encounter (HOSPITAL_COMMUNITY)
Admission: RE | Disposition: A | Discharge status: Home / Self Care | Payer: Self-pay | Source: Ambulatory Visit | Attending: Neurosurgery

## 2011-12-25 ENCOUNTER — Encounter (HOSPITAL_COMMUNITY): Payer: Self-pay | Admitting: *Deleted

## 2011-12-25 ENCOUNTER — Inpatient Hospital Stay (HOSPITAL_COMMUNITY): Payer: BC Managed Care – PPO

## 2011-12-25 ENCOUNTER — Inpatient Hospital Stay (HOSPITAL_COMMUNITY)
Admission: RE | Admit: 2011-12-25 | Discharge: 2011-12-28 | DRG: 864 | Disposition: A | Discharge status: Home / Self Care | Payer: BC Managed Care – PPO | Source: Ambulatory Visit | Attending: Neurosurgery | Admitting: Neurosurgery

## 2011-12-25 ENCOUNTER — Ambulatory Visit (HOSPITAL_COMMUNITY): Payer: BC Managed Care – PPO | Admitting: Vascular Surgery

## 2011-12-25 DIAGNOSIS — E7800 Pure hypercholesterolemia, unspecified: Secondary | ICD-10-CM | POA: Diagnosis present

## 2011-12-25 DIAGNOSIS — Z87891 Personal history of nicotine dependence: Secondary | ICD-10-CM

## 2011-12-25 DIAGNOSIS — Z7982 Long term (current) use of aspirin: Secondary | ICD-10-CM

## 2011-12-25 DIAGNOSIS — IMO0002 Reserved for concepts with insufficient information to code with codable children: Secondary | ICD-10-CM | POA: Diagnosis present

## 2011-12-25 DIAGNOSIS — Z9861 Coronary angioplasty status: Secondary | ICD-10-CM

## 2011-12-25 DIAGNOSIS — Z01812 Encounter for preprocedural laboratory examination: Secondary | ICD-10-CM

## 2011-12-25 DIAGNOSIS — I10 Essential (primary) hypertension: Secondary | ICD-10-CM | POA: Diagnosis present

## 2011-12-25 DIAGNOSIS — I2510 Atherosclerotic heart disease of native coronary artery without angina pectoris: Secondary | ICD-10-CM

## 2011-12-25 DIAGNOSIS — I509 Heart failure, unspecified: Secondary | ICD-10-CM | POA: Diagnosis present

## 2011-12-25 DIAGNOSIS — I447 Left bundle-branch block, unspecified: Secondary | ICD-10-CM | POA: Diagnosis present

## 2011-12-25 DIAGNOSIS — Z79899 Other long term (current) drug therapy: Secondary | ICD-10-CM

## 2011-12-25 DIAGNOSIS — M51379 Other intervertebral disc degeneration, lumbosacral region without mention of lumbar back pain or lower extremity pain: Principal | ICD-10-CM | POA: Diagnosis present

## 2011-12-25 DIAGNOSIS — I252 Old myocardial infarction: Secondary | ICD-10-CM

## 2011-12-25 DIAGNOSIS — M5137 Other intervertebral disc degeneration, lumbosacral region: Principal | ICD-10-CM | POA: Diagnosis present

## 2011-12-25 SURGERY — POSTERIOR LUMBAR FUSION 1 LEVEL
Anesthesia: General | Site: Back | Wound class: Clean

## 2011-12-25 MED ORDER — OXYCODONE-ACETAMINOPHEN 5-325 MG PO TABS
1.0000 | ORAL_TABLET | ORAL | Status: DC | PRN
  Administered 2011-12-26 – 2011-12-28 (×10): 2 via ORAL
  Filled 2011-12-25 (×11): qty 2

## 2011-12-25 MED ORDER — PROPOFOL 10 MG/ML IV BOLUS
INTRAVENOUS | Status: DC | PRN
  Administered 2011-12-25: 150 mg via INTRAVENOUS
  Administered 2011-12-25: 50 mg via INTRAVENOUS

## 2011-12-25 MED ORDER — HYDROMORPHONE HCL PF 1 MG/ML IJ SOLN
INTRAMUSCULAR | Status: AC
  Filled 2011-12-25: qty 1

## 2011-12-25 MED ORDER — ONDANSETRON HCL 4 MG/2ML IJ SOLN
INTRAMUSCULAR | Status: DC | PRN
  Administered 2011-12-25: 4 mg via INTRAVENOUS

## 2011-12-25 MED ORDER — ACETAMINOPHEN 10 MG/ML IV SOLN
1000.0000 mg | Freq: Four times a day (QID) | INTRAVENOUS | Status: AC
  Administered 2011-12-25 – 2011-12-26 (×4): 1000 mg via INTRAVENOUS
  Filled 2011-12-25 (×6): qty 100

## 2011-12-25 MED ORDER — AMLODIPINE BESYLATE 5 MG PO TABS
5.0000 mg | ORAL_TABLET | Freq: Every day | ORAL | Status: DC
  Administered 2011-12-26 – 2011-12-28 (×3): 5 mg via ORAL
  Filled 2011-12-25 (×4): qty 1

## 2011-12-25 MED ORDER — MENTHOL 3 MG MT LOZG: 1.0000 | LOZENGE | OROMUCOSAL | Status: DC | PRN

## 2011-12-25 MED ORDER — 0.9 % SODIUM CHLORIDE (POUR BTL) OPTIME
TOPICAL | Status: DC | PRN
  Administered 2011-12-25: 1000 mL

## 2011-12-25 MED ORDER — NALOXONE HCL 0.4 MG/ML IJ SOLN: 0.4000 mg | INTRAMUSCULAR | Status: DC | PRN

## 2011-12-25 MED ORDER — VECURONIUM BROMIDE 10 MG IV SOLR
INTRAVENOUS | Status: DC | PRN
  Administered 2011-12-25 (×3): 5 mg via INTRAVENOUS

## 2011-12-25 MED ORDER — SODIUM CHLORIDE 0.9 % IJ SOLN: 9.0000 mL | INTRAMUSCULAR | Status: DC | PRN

## 2011-12-25 MED ORDER — DIAZEPAM 5 MG/ML IJ SOLN: 5.0000 mg | Freq: Once | INTRAMUSCULAR | Status: DC

## 2011-12-25 MED ORDER — BUPIVACAINE LIPOSOME 1.3 % IJ SUSP
INTRAMUSCULAR | Status: DC | PRN
  Administered 2011-12-25: 20 mL

## 2011-12-25 MED ORDER — ONDANSETRON HCL 4 MG/2ML IJ SOLN: 4.0000 mg | Freq: Four times a day (QID) | INTRAMUSCULAR | Status: DC | PRN

## 2011-12-25 MED ORDER — ROCURONIUM BROMIDE 100 MG/10ML IV SOLN
INTRAVENOUS | Status: DC | PRN
  Administered 2011-12-25 (×2): 50 mg via INTRAVENOUS

## 2011-12-25 MED ORDER — OXYCODONE HCL 5 MG PO TABS
5.0000 mg | ORAL_TABLET | Freq: Once | ORAL | Status: AC | PRN
  Administered 2011-12-25: 5 mg via ORAL

## 2011-12-25 MED ORDER — DIPHENHYDRAMINE HCL 50 MG/ML IJ SOLN: 12.5000 mg | Freq: Four times a day (QID) | INTRAMUSCULAR | Status: DC | PRN

## 2011-12-25 MED ORDER — ZOLPIDEM TARTRATE 5 MG PO TABS: 10.0000 mg | ORAL_TABLET | Freq: Every evening | ORAL | Status: DC | PRN

## 2011-12-25 MED ORDER — DIAZEPAM 5 MG/ML IJ SOLN
INTRAMUSCULAR | Status: AC
  Administered 2011-12-25 (×2): 2.5 mg
  Filled 2011-12-25: qty 2

## 2011-12-25 MED ORDER — ACETAMINOPHEN 325 MG PO TABS: 650.0000 mg | ORAL_TABLET | ORAL | Status: DC | PRN

## 2011-12-25 MED ORDER — THROMBIN 20000 UNITS EX SOLR
CUTANEOUS | Status: DC | PRN
  Administered 2011-12-25: 13:00:00 via TOPICAL

## 2011-12-25 MED ORDER — OXYCODONE HCL 5 MG/5ML PO SOLN: 5.0000 mg | Freq: Once | ORAL | Status: AC | PRN

## 2011-12-25 MED ORDER — DIAZEPAM 5 MG PO TABS
5.0000 mg | ORAL_TABLET | Freq: Four times a day (QID) | ORAL | Status: DC | PRN
  Administered 2011-12-25 – 2011-12-28 (×8): 5 mg via ORAL
  Filled 2011-12-25 (×9): qty 1

## 2011-12-25 MED ORDER — SODIUM CHLORIDE 0.9 % IV SOLN
10.0000 mg | INTRAVENOUS | Status: DC | PRN
  Administered 2011-12-25: 10 ug/min via INTRAVENOUS

## 2011-12-25 MED ORDER — PHENOL 1.4 % MT LIQD: 1.0000 | OROMUCOSAL | Status: DC | PRN

## 2011-12-25 MED ORDER — MIDAZOLAM HCL 5 MG/5ML IJ SOLN
INTRAMUSCULAR | Status: DC | PRN
  Administered 2011-12-25: 2 mg via INTRAVENOUS

## 2011-12-25 MED ORDER — FENTANYL CITRATE 0.05 MG/ML IJ SOLN
INTRAMUSCULAR | Status: DC | PRN
  Administered 2011-12-25: 50 ug via INTRAVENOUS
  Administered 2011-12-25 (×2): 100 ug via INTRAVENOUS

## 2011-12-25 MED ORDER — HYDROMORPHONE 0.3 MG/ML IV SOLN
INTRAVENOUS | Status: DC
  Administered 2011-12-25: 20:00:00 via INTRAVENOUS
  Filled 2011-12-25: qty 25

## 2011-12-25 MED ORDER — LIDOCAINE HCL (CARDIAC) 20 MG/ML IV SOLN
INTRAVENOUS | Status: DC | PRN
  Administered 2011-12-25: 100 mg via INTRAVENOUS

## 2011-12-25 MED ORDER — MORPHINE SULFATE (PF) 1 MG/ML IV SOLN: INTRAVENOUS | Status: DC

## 2011-12-25 MED ORDER — OXYCODONE HCL 5 MG PO TABS
ORAL_TABLET | ORAL | Status: AC
  Filled 2011-12-25: qty 1

## 2011-12-25 MED ORDER — ONDANSETRON HCL 4 MG/2ML IJ SOLN: 4.0000 mg | INTRAMUSCULAR | Status: DC | PRN

## 2011-12-25 MED ORDER — SODIUM CHLORIDE 0.9 % IV SOLN
INTRAVENOUS | Status: DC
  Administered 2011-12-25 – 2011-12-26 (×2): via INTRAVENOUS

## 2011-12-25 MED ORDER — SODIUM CHLORIDE 0.9 % IJ SOLN
3.0000 mL | Freq: Two times a day (BID) | INTRAMUSCULAR | Status: DC
  Administered 2011-12-25 – 2011-12-28 (×4): 3 mL via INTRAVENOUS

## 2011-12-25 MED ORDER — LACTATED RINGERS IV SOLN
INTRAVENOUS | Status: DC | PRN
  Administered 2011-12-25 (×2): via INTRAVENOUS

## 2011-12-25 MED ORDER — DEXAMETHASONE SODIUM PHOSPHATE 4 MG/ML IJ SOLN
INTRAMUSCULAR | Status: DC | PRN
  Administered 2011-12-25: 8 mg via INTRAVENOUS

## 2011-12-25 MED ORDER — SODIUM CHLORIDE 0.9 % IJ SOLN
3.0000 mL | INTRAMUSCULAR | Status: DC | PRN
  Administered 2011-12-26 – 2011-12-27 (×3): 3 mL via INTRAVENOUS

## 2011-12-25 MED ORDER — ACETAMINOPHEN 650 MG RE SUPP: 650.0000 mg | RECTAL | Status: DC | PRN

## 2011-12-25 MED ORDER — CEFAZOLIN SODIUM 1-5 GM-% IV SOLN
1.0000 g | Freq: Three times a day (TID) | INTRAVENOUS | Status: AC
  Administered 2011-12-25 – 2011-12-26 (×2): 1 g via INTRAVENOUS
  Filled 2011-12-25 (×2): qty 50

## 2011-12-25 MED ORDER — ACETAMINOPHEN 10 MG/ML IV SOLN
INTRAVENOUS | Status: AC
  Filled 2011-12-25: qty 100

## 2011-12-25 MED ORDER — MORPHINE SULFATE (PF) 1 MG/ML IV SOLN
INTRAVENOUS | Status: AC
  Administered 2011-12-25: 19:00:00
  Filled 2011-12-25: qty 25

## 2011-12-25 MED ORDER — DIPHENHYDRAMINE HCL 12.5 MG/5ML PO ELIX: 12.5000 mg | ORAL_SOLUTION | Freq: Four times a day (QID) | ORAL | Status: DC | PRN

## 2011-12-25 MED ORDER — HYDROMORPHONE HCL PF 1 MG/ML IJ SOLN
0.2500 mg | INTRAMUSCULAR | Status: DC | PRN
  Administered 2011-12-25 (×4): 0.5 mg via INTRAVENOUS

## 2011-12-25 MED ORDER — PHENYLEPHRINE HCL 10 MG/ML IJ SOLN
INTRAMUSCULAR | Status: DC | PRN
  Administered 2011-12-25 (×2): 80 ug via INTRAVENOUS
  Administered 2011-12-25 (×2): 40 ug via INTRAVENOUS
  Administered 2011-12-25: 80 ug via INTRAVENOUS

## 2011-12-25 MED ORDER — PROMETHAZINE HCL 25 MG/ML IJ SOLN: 6.2500 mg | INTRAMUSCULAR | Status: DC | PRN

## 2011-12-25 MED ORDER — SODIUM CHLORIDE 0.9 % IV SOLN: 250.0000 mL | INTRAVENOUS | Status: DC

## 2011-12-25 MED ORDER — BUPIVACAINE LIPOSOME 1.3 % IJ SUSP
20.0000 mL | Freq: Once | INTRAMUSCULAR | Status: DC
  Filled 2011-12-25: qty 20

## 2011-12-25 SURGICAL SUPPLY — 75 items
ADH SKN CLS LQ APL DERMABOND (GAUZE/BANDAGES/DRESSINGS) ×1
APL SKNCLS STERI-STRIP NONHPOA (GAUZE/BANDAGES/DRESSINGS) ×1
BENZOIN TINCTURE PRP APPL 2/3 (GAUZE/BANDAGES/DRESSINGS) ×2 IMPLANT
BLADE SURG ROTATE 9660 (MISCELLANEOUS) IMPLANT
BONE EQUIVA 10CC (Bone Implant) ×1 IMPLANT
BUR ACORN 6.0 (BURR) ×2 IMPLANT
BUR MATCHSTICK NEURO 3.0 LAGG (BURR) ×2 IMPLANT
CANISTER SUCTION 2500CC (MISCELLANEOUS) ×2 IMPLANT
CAP REVERE LOCKING (Cap) ×4 IMPLANT
CATH FOLEY 2WAY SLVR  5CC 16FR (CATHETERS) ×1
CATH FOLEY 2WAY SLVR 5CC 16FR (CATHETERS) IMPLANT
CLOTH BEACON ORANGE TIMEOUT ST (SAFETY) ×2 IMPLANT
CONT SPEC 4OZ CLIKSEAL STRL BL (MISCELLANEOUS) ×2 IMPLANT
COVER BACK TABLE 24X17X13 BIG (DRAPES) IMPLANT
COVER TABLE BACK 60X90 (DRAPES) ×2 IMPLANT
DERMABOND ADHESIVE PROPEN (GAUZE/BANDAGES/DRESSINGS) ×1
DERMABOND ADVANCED .7 DNX6 (GAUZE/BANDAGES/DRESSINGS) IMPLANT
DRAPE C-ARM 42X72 X-RAY (DRAPES) ×4 IMPLANT
DRAPE LAPAROTOMY 100X72X124 (DRAPES) ×2 IMPLANT
DRAPE POUCH INSTRU U-SHP 10X18 (DRAPES) ×2 IMPLANT
DRSG PAD ABDOMINAL 8X10 ST (GAUZE/BANDAGES/DRESSINGS) IMPLANT
DURAPREP 26ML APPLICATOR (WOUND CARE) ×2 IMPLANT
ELECT REM PT RETURN 9FT ADLT (ELECTROSURGICAL) ×2
ELECTRODE REM PT RTRN 9FT ADLT (ELECTROSURGICAL) ×1 IMPLANT
EVACUATOR 1/8 PVC DRAIN (DRAIN) IMPLANT
EVACUATOR 3/16  PVC DRAIN (DRAIN) ×1
EVACUATOR 3/16 PVC DRAIN (DRAIN) IMPLANT
GAUZE SPONGE 4X4 16PLY XRAY LF (GAUZE/BANDAGES/DRESSINGS) ×1 IMPLANT
GLOVE BIO SURGEON STRL SZ8 (GLOVE) ×1 IMPLANT
GLOVE BIOGEL M 8.0 STRL (GLOVE) ×3 IMPLANT
GLOVE BIOGEL PI IND STRL 7.0 (GLOVE) IMPLANT
GLOVE BIOGEL PI INDICATOR 7.0 (GLOVE) ×2
GLOVE EXAM NITRILE LRG STRL (GLOVE) IMPLANT
GLOVE EXAM NITRILE MD LF STRL (GLOVE) IMPLANT
GLOVE EXAM NITRILE XL STR (GLOVE) IMPLANT
GLOVE EXAM NITRILE XS STR PU (GLOVE) IMPLANT
GLOVE INDICATOR 8.5 STRL (GLOVE) ×1 IMPLANT
GLOVE SURG SS PI 6.5 STRL IVOR (GLOVE) ×4 IMPLANT
GOWN BRE IMP SLV AUR LG STRL (GOWN DISPOSABLE) ×2 IMPLANT
GOWN BRE IMP SLV AUR XL STRL (GOWN DISPOSABLE) ×4 IMPLANT
GOWN STRL REIN 2XL LVL4 (GOWN DISPOSABLE) IMPLANT
KIT BASIN OR (CUSTOM PROCEDURE TRAY) ×2 IMPLANT
KIT ROOM TURNOVER OR (KITS) ×2 IMPLANT
NDL HYPO 18GX1.5 BLUNT FILL (NEEDLE) IMPLANT
NDL HYPO 21X1.5 SAFETY (NEEDLE) IMPLANT
NDL HYPO 25X1 1.5 SAFETY (NEEDLE) IMPLANT
NEEDLE HYPO 18GX1.5 BLUNT FILL (NEEDLE) IMPLANT
NEEDLE HYPO 21X1.5 SAFETY (NEEDLE) ×2 IMPLANT
NEEDLE HYPO 25X1 1.5 SAFETY (NEEDLE) IMPLANT
NS IRRIG 1000ML POUR BTL (IV SOLUTION) ×2 IMPLANT
PACK LAMINECTOMY NEURO (CUSTOM PROCEDURE TRAY) ×2 IMPLANT
PAD ARMBOARD 7.5X6 YLW CONV (MISCELLANEOUS) ×6 IMPLANT
PATTIES SURGICAL .5 X1 (DISPOSABLE) ×1 IMPLANT
PATTIES SURGICAL .5 X3 (DISPOSABLE) IMPLANT
ROD REVERE CURVED 6.35X35MM (Rod) ×2 IMPLANT
SCREW 6.5X60 (Screw) ×2 IMPLANT
SCREW REVERE 6.5X50MM (Screw) ×2 IMPLANT
SPONGE GAUZE 4X4 12PLY (GAUZE/BANDAGES/DRESSINGS) ×2 IMPLANT
SPONGE LAP 4X18 X RAY DECT (DISPOSABLE) IMPLANT
SPONGE NEURO XRAY DETECT 1X3 (DISPOSABLE) IMPLANT
SPONGE SURGIFOAM ABS GEL 100 (HEMOSTASIS) ×2 IMPLANT
STRIP CLOSURE SKIN 1/2X4 (GAUZE/BANDAGES/DRESSINGS) ×2 IMPLANT
SUT VIC AB 1 CT1 18XBRD ANBCTR (SUTURE) ×2 IMPLANT
SUT VIC AB 1 CT1 8-18 (SUTURE) ×2
SUT VIC AB 2-0 CP2 18 (SUTURE) ×2 IMPLANT
SUT VIC AB 3-0 SH 8-18 (SUTURE) ×3 IMPLANT
SYR 20CC LL (SYRINGE) ×1 IMPLANT
SYR 20ML ECCENTRIC (SYRINGE) ×2 IMPLANT
SYR 5ML LL (SYRINGE) IMPLANT
SYRINGE 10CC LL (SYRINGE) ×1 IMPLANT
TAPE CLOTH SURG 4X10 WHT LF (GAUZE/BANDAGES/DRESSINGS) ×1 IMPLANT
TOWEL OR 17X24 6PK STRL BLUE (TOWEL DISPOSABLE) ×2 IMPLANT
TOWEL OR 17X26 10 PK STRL BLUE (TOWEL DISPOSABLE) ×2 IMPLANT
TRAY FOLEY CATH 14FRSI W/METER (CATHETERS) ×2 IMPLANT
WATER STERILE IRR 1000ML POUR (IV SOLUTION) ×2 IMPLANT

## 2011-12-25 NOTE — Transfer of Care (Signed)
Immediate Anesthesia Transfer of Care Note  Patient: [name]  Procedure(s) Performed: Procedure(s) (LRB) with comments: POSTERIOR LUMBAR FUSION 1 LEVEL (N/A) - Lumbar five-sacral one Diskectomy, fusion, cages, posterolateral arthrodesis, pedicle screws, cellsaver  Patient Location: PACU  Anesthesia Type:General  Level of Consciousness: awake, oriented, patient cooperative and responds to stimulation  Airway & Oxygen Therapy: Patient Spontanous Breathing and Patient connected to nasal cannula oxygen  Post-op Assessment: Report given to PACU RN, Post -op Vital signs reviewed and stable and Patient moving all extremities X 4  Post vital signs: Reviewed and stable  Complications: No apparent anesthesia complications

## 2011-12-25 NOTE — Progress Notes (Signed)
Pt observed to be in pain,shaky and in tears,on iv morphine PCA which pt said is not taking care of his pain, Dr Wynetta Emery (on call) paged and notified,ordered to change iv morphine to dilaudid PCA, pt reassured,will continue to monitor. Obasogie-Asidi, Daymond Cordts Efe

## 2011-12-25 NOTE — Progress Notes (Signed)
LUMBAR FUSION L5S1 WAS DONE. OP NOTE 032-101. FOLEY INSERTED BY GU .FOLEY

## 2011-12-25 NOTE — Anesthesia Postprocedure Evaluation (Signed)
  Anesthesia Post-op Note  Patient: [name]  Procedure(s) Performed: Procedure(s) (LRB) with comments: POSTERIOR LUMBAR FUSION 1 LEVEL (N/A) - Lumbar five-sacral one Diskectomy, fusion, cages, posterolateral arthrodesis, pedicle screws, cellsaver  Patient Location: PACU  Anesthesia Type:General  Level of Consciousness: awake, alert  and oriented  Airway and Oxygen Therapy: Patient Spontanous Breathing  Post-op Pain: mild  Post-op Assessment: Post-op Vital signs reviewed  Post-op Vital Signs: Reviewed  Complications: No apparent anesthesia complications

## 2011-12-25 NOTE — H&P (Signed)
[name] is an 56 y.o. male.   Chief Complaint: lower back pain HPI: more than a year history of lower back pain with radiation to the right foot which is not getting better with conservative treatment including epidurals. He underwent anterior cervical fusion by me  Past Medical History  Diagnosis Date  . Hypertension   . Hypercholesterolemia   . LBBB (left bundle branch block)   . MI (myocardial infarction) 1993    LATERAL  . Varicose veins   . Arthritis   . S/P coronary artery stent placement     RCA  . Pneumonia   . Kidney stone     approx 5 years ago  . Coronary artery disease   . CHF (congestive heart failure)     Past Surgical History  Procedure Date  . Coronary angioplasty     DIRECT ANGIOPLASTY THE MARGINAL BRANCH  . Coronary stent placement     RCA  . Cardiac catheterization 2009    Stents in RCA patent. 70 to 80% PL, and 50% ostial PD.   Marland Kitchen Anterior cervical decomp/discectomy fusion 03/03/2011    Procedure: ANTERIOR CERVICAL DECOMPRESSION/DISCECTOMY FUSION 3 LEVELS;  Surgeon: Karn Cassis, MD;  Location: [facility];  Service: Neurosurgery;  Laterality: N/A;  Cervical three-four Cervical four-five Cervical five-six Anterior cervical decompression/diskectomy, fusion    Family History  Problem Relation Age of Onset  . Heart attack Father   . Heart disease Brother   . Breast cancer Mother   . Coronary artery disease Mother    Social History:  reports that he quit smoking about 12 years ago. His smoking use included Cigarettes. He has a 40 pack-year smoking history. He does not have any smokeless tobacco history on file. He reports that he drinks about 3.6 ounces of alcohol per week. He reports that he does not use illicit drugs.  Allergies:  Allergies  Allergen Reactions  . Ezetimibe Other (See Comments)    Joint pain and drops BP  . Niacin Other (See Comments)    Drops BP and severe  . Statins Other (See Comments)    Severe joint pain and drops BP.     Medications Prior to Admission  Medication Sig Dispense Refill  . amLODipine (NORVASC) 5 MG tablet Take 5 mg by mouth daily.      Marland Kitchen aspirin 81 MG tablet Take 81 mg by mouth daily.        . diazepam (VALIUM) 5 MG tablet Take 5 mg by mouth daily as needed. For spasms Usually takes every other day if needed      . fenofibrate (TRICOR) 145 MG tablet Take 1 tablet (145 mg total) by mouth daily.  90 tablet  3  . Flaxseed, Linseed, (FLAX SEED OIL PO) Take 1 capsule by mouth 3 (three) times daily.       . meloxicam (MOBIC) 15 MG tablet Take 15 mg by mouth daily.      . Multiple Vitamin (MULTIVITAMIN) tablet Take 1 tablet by mouth 2 (two) times daily.        Marland Kitchen oxyCODONE (ROXICODONE) 15 MG immediate release tablet Take 15 mg by mouth every 4 (four) hours as needed. For pain      . ramipril (ALTACE) 2.5 MG tablet Take 2.5 mg by mouth daily.        Marland Kitchen zolpidem (AMBIEN) 10 MG tablet Take 5 mg by mouth at bedtime as needed. For sleep        No results  found for this or any previous visit (from the past 48 hour(s)). No results found.  Review of Systems  Constitutional: Negative.   HENT: Negative.   Eyes: Negative.   Respiratory: Negative.   Cardiovascular:       Arterial hypertension , cardiac stent  Gastrointestinal: Negative.   Genitourinary: Negative.   Musculoskeletal: Positive for back pain.  Skin: Negative.   Neurological: Positive for focal weakness.  Endo/Heme/Allergies: Negative.     Blood pressure 148/88, temperature 98.1 F (36.7 C), temperature source Oral, resp. rate 18, SpO2 95.00%. Physical Exam hent, nl. Neck, scar anteriorly. Lungs, clear. Cv,nl. Abdomen soft. Extremities nl. NEURO LIMPING FROM THERIGHT LEG. CAN NOT SIT BECAUSE PAIN. WEAKNESS of DF of right foot. SLR positive at 30 on right and 45 on left. Mri, show l5s1 spondilolisthesis with stenosis  Assessment/Plan Decompression and fusion at l5s1. Patient aware of risks and benefits.  [name] M 12/25/2011,  1:17 PM

## 2011-12-25 NOTE — Preoperative (Signed)
Beta Blockers   Reason not to administer Beta Blockers:Not Applicable 

## 2011-12-25 NOTE — Anesthesia Preprocedure Evaluation (Addendum)
Anesthesia Evaluation    Reviewed: Allergy & Precautions, H&P , NPO status , Patient's Chart, lab work & pertinent test results  History of Anesthesia Complications Negative for: history of anesthetic complications  Airway       Dental   Pulmonary neg pulmonary ROS,          Cardiovascular hypertension, Pt. on medications + CAD, + Past MI, + Cardiac Stents and +CHF     Neuro/Psych    GI/Hepatic negative GI ROS, Neg liver ROS,   Endo/Other  negative endocrine ROS  Renal/GU negative Renal ROS     Musculoskeletal   Abdominal   Peds  Hematology   Anesthesia Other Findings   Reproductive/Obstetrics                          Anesthesia Physical Anesthesia Plan  ASA: III  Anesthesia Plan: General   Post-op Pain Management:    Induction: Intravenous  Airway Management Planned: Oral ETT  Additional Equipment:   Intra-op Plan:   Post-operative Plan: Extubation in OR  Informed Consent:   Plan Discussed with:   Anesthesia Plan Comments:         Anesthesia Quick Evaluation

## 2011-12-25 NOTE — Consult Note (Signed)
Urology Consult  Requesting provider:  Dr. Jeral Fruit  CC: Urethral stricture  HPI: 56 year old male presents to the hospital today for a fusion by Dr. Jeral Fruit in the neurosurgical ICU. There was attempted to place a Foley catheter with return of a small amount of blood, the nurses estimate less than 10 cc. I was called as the patient was already under anesthetic and intubated. I discussed the case with Dr. Jeral Fruit. I attempted to find the patient's family, but they were not present in the waiting room. Dr. Jeral Fruit states that he is unaware of any previous voiding problems with the patient. I checked our office database and do not find any records that he is been seen by our urology office.  I first prepped and draped his genitals in the usual sterile fashion an attempted gentle placement of a 16 French coud catheter. This would not pass easily so I removed the catheter and there was noted to be a small amount of blood on the tip of the catheter, but no flowing blood. Next I performed flexible cystoscopy and noted that there was a large false passage in the bulbar urethra. I was able to identify the true lumen which was almost pinpoint in size. I was able to thread this with a sensor tip wire. I removed the cystoscope and replaced alongside the wire. Because this was located more lateral than I anticipated, I threaded a 5 [place] ureter catheter over the sensor tip wire and into what I believe to be the bladder. The wire was removed and there was return of urine indicating that there was proper placement of the wire within the bladder. Next I loaded a dilating balloon which dilates to 24 [place] in size over the wire. This was placed with direct visualization to ensure that the balloon was across the strictured area. I then removed the cystoscope and inflated the balloon to 12 atmospheres and held this for 3 minutes. After I deflated the balloon, there was no return of blood. I left the wire in place and passed the  flexible cystoscope alongside the wire. I was able to see the true lumen easily and navigate into the bladder. The stricture was noted to be distal to the verumontanum and could be located anywhere along the membranous urethra versus the bulbar urethra. I inspected the inside of the bladder and there was noted to be no injury inside the bladder. Next I removed the cystoscope and placed a 20 [place] council tip catheter over the wire with ease. There was good return of urine and I removed the wire and inflated the balloon with 10 cc of sterile water.  Completed the procedure. I informed Dr. Jeral Fruit of my findings. I explained that the patient will need to have the catheter in place for approximately 10-14 days.  PMH: Past Medical History  Diagnosis Date  . Hypertension   . Hypercholesterolemia   . LBBB (left bundle branch block)   . MI (myocardial infarction) 1993    LATERAL  . Varicose veins   . Arthritis   . S/P coronary artery stent placement     RCA  . Pneumonia   . Kidney stone     approx 5 years ago  . Coronary artery disease   . CHF (congestive heart failure)     PSH: Past Surgical History  Procedure Date  . Coronary angioplasty     DIRECT ANGIOPLASTY THE MARGINAL BRANCH  . Coronary stent placement     RCA  .  Cardiac catheterization 2009    Stents in RCA patent. 70 to 80% PL, and 50% ostial PD.   Marland Kitchen Anterior cervical decomp/discectomy fusion 03/03/2011    Procedure: ANTERIOR CERVICAL DECOMPRESSION/DISCECTOMY FUSION 3 LEVELS;  Surgeon: Karn Cassis, MD;  Location: [facility];  Service: Neurosurgery;  Laterality: N/A;  Cervical three-four Cervical four-five Cervical five-six Anterior cervical decompression/diskectomy, fusion    Allergies: Allergies  Allergen Reactions  . Ezetimibe Other (See Comments)    Joint pain and drops BP  . Niacin Other (See Comments)    Drops BP and severe  . Statins Other (See Comments)    Severe joint pain and drops BP.     Medications: Prescriptions prior to admission  Medication Sig Dispense Refill  . amLODipine (NORVASC) 5 MG tablet Take 5 mg by mouth daily.      Marland Kitchen aspirin 81 MG tablet Take 81 mg by mouth daily.        . diazepam (VALIUM) 5 MG tablet Take 5 mg by mouth daily as needed. For spasms Usually takes every other day if needed      . fenofibrate (TRICOR) 145 MG tablet Take 1 tablet (145 mg total) by mouth daily.  90 tablet  3  . Flaxseed, Linseed, (FLAX SEED OIL PO) Take 1 capsule by mouth 3 (three) times daily.       . meloxicam (MOBIC) 15 MG tablet Take 15 mg by mouth daily.      . Multiple Vitamin (MULTIVITAMIN) tablet Take 1 tablet by mouth 2 (two) times daily.        Marland Kitchen oxyCODONE (ROXICODONE) 15 MG immediate release tablet Take 15 mg by mouth every 4 (four) hours as needed. For pain      . ramipril (ALTACE) 2.5 MG tablet Take 2.5 mg by mouth daily.        Marland Kitchen zolpidem (AMBIEN) 10 MG tablet Take 5 mg by mouth at bedtime as needed. For sleep         Social History: History   Social History  . Marital Status: Married    Spouse Name: N/A    Number of Children: 1  . Years of Education: N/A   Occupational History  . 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 Smoking status: Former Smoker -- 2.0 packs/day for 20 years    Types: Cigarettes    Quit date: 05/07/1999  . Smokeless tobacco: Not on file  . Alcohol Use: 3.6 oz/week    6 Cans of beer per week     Comment: weekly  . Drug Use: No  . Sexually Active:    Other Topics Concern  . Not on file   Social History Narrative  . No narrative on file    Family History: Family History  Problem Relation Age of Onset  . Heart attack Father   . Heart disease Brother   . Breast cancer Mother   . Coronary artery disease Mother     Review of Systems: Unable to obtain as the patient was under general anesthetic. I.  Physical Exam:  General: Under general anesthetic. Neck:  Supple.  No lymphadenopathy. CV:  S1 present. S2  present. Regular rate. Pulmonary: Intubated.  Clear to auscultation bilaterally. Abdomen: Soft.  Non- tender to palpation. Skin:  Normal turgor.  No visible rash. Extremity: No gross deformity of bilateral upper extremities.  No gross deformity of    bilateral lower extremities. Neurologic: Alert. Appropriate mood.  Penis:  Uncircumcised.  No  lesions. Urethra: No Foley catheter in place.  Orthotopic meatus. Scrotum: No lesions.  No ecchymosis.  No erythema. Testicles: Descended bilaterally.  No masses bilaterally.   Studies:  No results found for this basename: HGB:2,WBC:2,PLT:2 in the last 72 hours  No results found for this basename: NA:2,K:2,CL:2,CO2:2,BUN:2,CREATININE:2,CALCIUM:2,MAGNESIUM:2,GFRNONAA:2,GFRAA:2 in the last 72 hours   No results found for this basename: PT:2,INR:2,APTT:2 in the last 72 hours   No components found with this basename: ABG:2    Assessment:  Urethral stricture   Plan: Successful cystoscopy with balloon dilation of urethral stricture.  His catheter will need to stay in place for approximately 10-14 days. I will schedule a followup appointment with him for catheter removal.  I tried to find his family in the waiting room, but was unsuccessful in finding them. Our return to see the patient in the morning to discuss the findings and explained the procedures to him.   I discussed this consultation with Dr. Jeral Fruit.  Pager: [phone]

## 2011-12-26 MED ORDER — HYDROMORPHONE HCL PF 1 MG/ML IJ SOLN
1.0000 mg | INTRAMUSCULAR | Status: DC | PRN
  Administered 2011-12-26 – 2011-12-27 (×9): 1 mg via INTRAVENOUS
  Filled 2011-12-26 (×9): qty 1

## 2011-12-26 NOTE — Evaluation (Signed)
Physical Therapy Evaluation Patient Details Name: [name] MRN: [id number] DOB: [birth year] Today's Date: 12/26/2011 Time: 4098-1191 PT Time Calculation (min): 28 min  PT Assessment / Plan / Recommendation Clinical Impression  Pt s/p PLF L5-S1. Pt with decreased functional mobility and safety s/p surgery. Pt will benefit from skilled PT in the acute care setting in order to address the above deficits. Pt may likely be safe to d/c tomorrow with completion of stairs    PT Assessment  Patient needs continued PT services    Follow Up Recommendations  No PT follow up;Supervision for mobility/OOB    Does the patient have the potential to tolerate intense rehabilitation      Barriers to Discharge        Equipment Recommendations  Rolling walker with 5" wheels    Recommendations for Other Services     Frequency Min 5X/week    Precautions / Restrictions Precautions Precautions: Back Precaution Booklet Issued: Yes (comment) Precaution Comments: pt educated on 3/3 back precautions Required Braces or Orthoses: Spinal Brace Spinal Brace: Lumbar corset;Applied in sitting position Restrictions Weight Bearing Restrictions: No   Pertinent Vitals/Pain Pain 6/10 at surgical site. RN pain meds given.       Mobility  Bed Mobility Bed Mobility: Rolling Left;Left Sidelying to Sit;Sitting - Scoot to Edge of Bed Rolling Left: 5: Supervision Left Sidelying to Sit: 4: Min assist Sitting - Scoot to Edge of Bed: 5: Supervision Details for Bed Mobility Assistance: VC for proper sequencing to maintian back precautions during log roll in bed. Min assist into sitting for support through trunk Transfers Transfers: Sit to Stand;Stand to Sit Sit to Stand: 4: Min guard;With upper extremity assist;From bed Stand to Sit: 4: Min guard;With upper extremity assist;To chair/3-in-1 Details for Transfer Assistance: VC for safe hand placement to/from RW Ambulation/Gait Ambulation/Gait Assistance: 4: Min  guard Ambulation Distance (Feet): 400 Feet Assistive device: Rolling walker Ambulation/Gait Assistance Details: VC for safe distance to RW as well as proper technique and for uprigt posture Gait Pattern: Step-to pattern;Decreased stride length;Trunk flexed;Narrow base of support Gait velocity: slow Stairs: Yes Stairs Assistance: 4: Min assist;4: Min guard Stairs Assistance Details (indicate cue type and reason): VC for safe technique leading with strong leg and descending with weak leg. Min assist with 1 HHA with no rails, minguard with rails Stair Management Technique: No rails;Two rails;Step to pattern;Forwards Number of Stairs: 3     Shoulder Instructions     Exercises     PT Diagnosis: Difficulty walking;Acute pain  PT Problem List: Decreased activity tolerance;Decreased mobility;Decreased knowledge of use of DME;Decreased safety awareness;Decreased knowledge of precautions;Pain PT Treatment Interventions: DME instruction;Gait training;Stair training;Functional mobility training;Therapeutic activities;Patient/family education   PT Goals Acute Rehab PT Goals PT Goal Formulation: With patient Time For Goal Achievement: 01/02/12 Potential to Achieve Goals: Good Pt will go Supine/Side to Sit: with modified independence PT Goal: Supine/Side to Sit - Progress: Goal set today Pt will go Sit to Supine/Side: with modified independence PT Goal: Sit to Supine/Side - Progress: Goal set today Pt will go Sit to Stand: with modified independence PT Goal: Sit to Stand - Progress: Goal set today Pt will go Stand to Sit: with modified independence PT Goal: Stand to Sit - Progress: Goal set today Pt will Transfer Bed to Chair/Chair to Bed: with modified independence PT Transfer Goal: Bed to Chair/Chair to Bed - Progress: Goal set today Pt will Ambulate: >150 feet;with modified independence;with least restrictive assistive device PT Goal: Ambulate - Progress: Goal  set today Pt will Go Up / Down  Stairs: Flight;with supervision;with rail(s) PT Goal: Up/Down Stairs - Progress: Goal set today  Visit Information  Last PT Received On: 12/26/11 Assistance Needed: +1    Subjective Data  Patient Stated Goal: to decrease pain   Prior Functioning  Home Living Lives With: Spouse Available Help at Discharge: Family;Available 24 hours/day Type of Home: House Home Access: Stairs to enter [employer] of Steps: 2 Entrance Stairs-Rails: None Home Layout: Two level Alternate Level Stairs-Number of Steps: 13 Alternate Level Stairs-Rails: Left Bathroom Shower/Tub: Walk-in shower;Door [employer] Toilet: Standard Bathroom Accessibility: Yes How Accessible: Accessible via walker Home Adaptive Equipment: None Prior Function Level of Independence: Independent Able to Take Stairs?: Yes Driving: Yes 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 Communication: No difficulties Dominant Hand: Left    Cognition  Overall Cognitive Status: Appears within functional limits for tasks assessed/performed Arousal/Alertness: Awake/alert Orientation Level: Appears intact for tasks assessed Behavior During Session: [facility] for tasks performed    Extremity/Trunk Assessment Right Lower Extremity Assessment RLE ROM/Strength/Tone: Within functional levels RLE Sensation: WFL - Light Touch Left Lower Extremity Assessment LLE ROM/Strength/Tone: Within functional levels LLE Sensation: WFL - Light Touch   Balance    End of Session PT - End of Session Equipment Utilized During Treatment: Back brace;Gait belt Activity Tolerance: Patient tolerated treatment well Patient left: in chair;with call bell/phone within reach Nurse Communication: Mobility status  GP     Milana Kidney 12/26/2011, 9:12 AM  12/26/2011 Milana Kidney DPT PAGER: [phone] OFFICE: [phone]

## 2011-12-26 NOTE — Progress Notes (Signed)
Patient ID: [name], male   DOB: 1955/08/31, 56 y.o.   MRN: [id number] BP 115/43  Pulse 77  Temp 97.4 F (36.3 C) (Oral)  Resp 18  Ht 6\' 3"  (1.905 m)  Wt 96.9 kg (213 lb 10 oz)  BMI 26.70 kg/m2  SpO2 96% Alert and oriented x 4. Moving lower extremities well Will dc pca Will keep foley for 10 days, can go home with a leg bAg

## 2011-12-26 NOTE — Op Note (Signed)
NAMEBODIN, GORKA NO.:  [id number]  MEDICAL RECORD NO.:  [id number]  LOCATION:  [id number]                        FACILITY:  [facility]  PHYSICIAN:  Hilda Lias, M.D.   DATE OF BIRTH:  February 21, 1955  DATE OF PROCEDURE:  12/25/2011 DATE OF DISCHARGE:                              OPERATIVE REPORT   PREOPERATIVE DIAGNOSIS:  Grade 1, grade 2 spondylolisthesis with chronic radiculopathy.  Lumbar stenosis.  Status post anterior cervical fusion.  POSTOPERATIVE DIAGNOSIS:  Grade 1, grade 2 spondylolisthesis with chronic radiculopathy.  Lumbar stenosis.  Status post anterior cervical fusion, plus stenosis of the urethra.  SURGEON:  Hilda Lias, MD  CLINICAL HISTORY:  [name] is a gentleman who several months ago underwent cervical fusion because severe stenosis in the cervical area. We knew prior to that time, he had grade 1, grade 2 spondylolisthesis at the L5 to S1 with chronic radiculopathy.  Surgery was advised.  The patient knew the risk with the surgery.  PROCEDURE IN DETAIL:  The patient was taken to surgery, and the nurse attempted to put in a Foley catheter, but she was unable, there was quite a bit of blood.  I tried twice and because of that we called the urologist.  The urologist came.  He had to do a cystoscopy, and finally, catheter was inserted.  The surgery was delayed for almost 2 hours. After the Foley was inserted and it was running, the patient was positioned on the OR table in a prone position.  The back was cleaned with Betadine and DuraPrep.  Midline incision from L5 to S1 was made, and muscles were retracted all the way laterally until we were able to see the transverse process.  Indeed the posterior arch of L5 was quite loose, and we removed the spinous process, lamina, and the facet.  The patient had quite a bit of adhesion.  Both L5 nerve roots were compromised more than S1.  At that level, there was quite a few osteophytes and fibrosis.   Lysis was accomplished, and we were able to decompress both L5 and S1 nerve root.  We tried to get into the disk space, but the disk space was calcified, we decided not to do any interbody fusion.  ___then,_______ having good decompression of the thecal sac and the L5-S1 nerve root, using the C-arm first in the AP view and then a lateral view, we probed the pedicle of L5-S1.  At the level of L5, we introduced 2 screws of 6.5 x 50.  Prior to introducing, _we probe to be sure than the_________ holes were  surrounded by bone.  At the level of S1, the pedicles size was 6.5 x 60.  AP and lateral showed good position of the pedicle screws.  Then, we went laterally and removed the periosteum of the lateral aspect of the L5-S1 and the ala of the sacrum.  A mix of autograft and morselized bone extensor was used for arthrodesis.  We have a good __amount of________ bone in the lateral aspect of the L5-S1 spine bilaterally.  Then to keep it in place, we used a rod with Capps.  The area was quite open.  Again, we went back into the foramen, and we were able to _feel _________ all the 4 foramen as well _the thecal sac_________ completely decompressed.  Having done this, the area was irrigated and the wound was closed with Vicryl and Steri- Strips.          ______________________________ Hilda Lias, M.D.     EB/MEDQ  D:  12/25/2011  T:  12/26/2011  Job:  [id number]

## 2011-12-26 NOTE — Progress Notes (Signed)
Pt c/o of inability to sleep due to the PCA alarming whenever he falls asleep,requested to d/c the iv PCA for now, Dr Wynetta Emery paged and notified,ordered to d/c PCA and to begin iv dilaudid 1mg  q2hrs as needed,will however continue to monitor,call light at bedside. Obasogie-Asidi, Cordon Gassett Efe

## 2011-12-26 NOTE — Progress Notes (Signed)
Orthopedic Tech Progress Note Patient Details:  [name] [birth year] [id number] Brace order completed by Aurelio Brash from [employer] Patient ID: [name], male   DOB: May 09, 1955, 56 y.o.   MRN: [id number]   [name] 12/26/2011, 11:52 AM

## 2011-12-26 NOTE — Plan of Care (Signed)
Problem: Phase I Progression Outcomes Goal: Pain controlled with appropriate interventions Outcome: Progressing Working with patient to define an effective pain regimen to keep patient up and activity. Goal: OOB as tolerated unless otherwise ordered Outcome: Progressing Patient is up to bedside chair for majority of shift.  Ambulating  In hallway x 3 with staff and therapy. Goal: Initial discharge plan identified Outcome: Completed/Met Date Met:  12/26/11 Discussed use of brace and walker safely when going home. Goal: PT/OT consults requested Outcome: Completed/Met Date Met:  12/26/11 Evaluated by both therapies.

## 2011-12-26 NOTE — Progress Notes (Signed)
Urology Progress Note  Subjective:     No acute urologic events overnight. Patient with pain from back surgery overnight. No problems with catheter.  ROS: Negative: chest pain.  Objective:  Patient Vitals for the past 24 hrs:  BP Temp Temp src Pulse Resp SpO2 Height Weight  12/26/11 0200 96/53 mmHg 98.3 F (36.8 C) - 70  18  94 % - -  12/26/11 0025 113/49 mmHg - - 72  12  92 % - -  12/25/11 2200 96/55 mmHg 97.7 F (36.5 C) - 78  18  97 % - -  12/25/11 2104 - - - - - - 6\' 3"  (1.905 m) 96.9 kg (213 lb 10 oz)  12/25/11 2003 - - - - 12  93 % - -  12/25/11 1859 142/79 mmHg 97.7 F (36.5 C) Oral 87  20  98 % - -  12/25/11 1845 - 97.1 F (36.2 C) - - - - - -  12/25/11 1833 127/49 mmHg - - 66  18  100 % - -  12/25/11 1815 123/55 mmHg - - 65  8  - - -  12/25/11 1807 122/57 mmHg - - 63  18  97 % - -  12/25/11 1748 - - - 68  18  100 % - -  12/25/11 1747 131/64 mmHg - - - 13  100 % - -  12/25/11 1741 - 98.3 F (36.8 C) - - - - - -  12/25/11 1052 148/88 mmHg 98.1 F (36.7 C) Oral - 18  95 % - -    Physical Exam: General:  No acute distress, awake Cardiovascular:    [x]   S1/S2 present, RRR  []   Irregularly irregular Chest:  CTA-B Abdomen:               []  Soft, appropriately TTP  [x]  Soft, NTTP  []  Soft, appropriately TTP, incision(s) clean/dry/intact  Genitourinary: Foley in place. Foley:  Draining amber urine.    I/O last 3 completed shifts: In: 1700 [I.V.:1700] Out: 800 [Urine:700; Blood:100]  No results found for this basename: HGB:2,WBC:2,PLT:2 in the last 72 hours  No results found for this basename: NA:2,K:2,CL:2,CO2:2,BUN:2,CREATININE:2,CALCIUM:2,MAGNESIUM:2,GFRNONAA:2,GFRAA:2 in the last 72 hours   No results found for this basename: PT:2,INR:2,APTT:2 in the last 72 hours   No components found with this basename: ABG:2    Length of stay: 1 days.  Assessment: Urethral stricture POD#1 Cystoscopy, urethral dilation with balloon.   Plan: -I explained the  surgical findings and course of surgery. -Continue foley catheter for 10-14 days. Will schedule follow up with GU. I have given him my card. -No need for antibiotics while on catheter beyond what he is given for his lumbar surgery -Please call with questions.   Natalia Leatherwood, MD [phone]

## 2011-12-27 MED ORDER — ASPIRIN 81 MG PO CHEW
81.0000 mg | CHEWABLE_TABLET | Freq: Every day | ORAL | Status: DC
  Administered 2011-12-27 – 2011-12-28 (×2): 81 mg via ORAL
  Filled 2011-12-27 (×2): qty 1

## 2011-12-27 MED ORDER — FENOFIBRATE 160 MG PO TABS
160.0000 mg | ORAL_TABLET | Freq: Every day | ORAL | Status: DC
  Administered 2011-12-27 – 2011-12-28 (×2): 160 mg via ORAL
  Filled 2011-12-27 (×2): qty 1

## 2011-12-27 MED ORDER — RAMIPRIL 2.5 MG PO CAPS
2.5000 mg | ORAL_CAPSULE | Freq: Every day | ORAL | Status: DC
  Administered 2011-12-27 – 2011-12-28 (×2): 2.5 mg via ORAL
  Filled 2011-12-27 (×2): qty 1

## 2011-12-27 NOTE — Plan of Care (Signed)
Problem: Phase I Progression Outcomes Goal: OOB as tolerated unless otherwise ordered Outcome: Completed/Met Date Met:  12/27/11 Patient able to ambulate with steady gait with assist of walker.

## 2011-12-27 NOTE — Progress Notes (Signed)
Physical Therapy Treatment Patient Details Name: SELAH ZELMAN MRN: [id number] DOB: [birth year] Today's Date: 12/27/2011 Time: 1050-1102 PT Time Calculation (min): 12 min  PT Assessment / Plan / Recommendation Comments on Treatment Session  Pt progressing with PT goals & mobility at this date.  Very pleasant & motivated to participate.  Pt reports "pressure" from catheter site, RN was notified.      Follow Up Recommendations  No PT follow up;Supervision for mobility/OOB     Does the patient have the potential to tolerate intense rehabilitation     Barriers to Discharge        Equipment Recommendations  Rolling walker with 5" wheels    Recommendations for Other Services    Frequency Min 5X/week   Plan Discharge plan remains appropriate    Precautions / Restrictions Precautions Precautions: Back Precaution Comments: Reviewed back precautions Required Braces or Orthoses: Spinal Brace Spinal Brace: Lumbar corset;Applied in sitting position Restrictions Weight Bearing Restrictions: No   Pertinent Vitals/Pain C/o "pressure" at catheter site.  RN notified.      Mobility  Bed Mobility Bed Mobility: Sit to Sidelying Left Sit to Sidelying Left: 5: Supervision;HOB flat Details for Bed Mobility Assistance: Pt performed well.   Transfers Transfers: Sit to Stand;Stand to Sit Sit to Stand: 5: Supervision;With upper extremity assist;From bed;From chair/3-in-1 Stand to Sit: 5: Supervision;With upper extremity assist;With armrests;To chair/3-in-1;To bed Details for Transfer Assistance: cues to reinforce safest hand placement Ambulation/Gait Ambulation/Gait Assistance: 5: Supervision Ambulation Distance (Feet): 400 Feet Assistive device: Rolling walker Ambulation/Gait Assistance Details: Attempted ambulation without use of RW, pt able to ambulate ~40' fairly well but he reports pain increased & stated he felt as though he should stay with RW for now.   Cues for tall posture.   Gait  Pattern: Step-through pattern;Decreased stride length (mild trunk flexion)      PT Goals Acute Rehab PT Goals Time For Goal Achievement: 01/02/12 Potential to Achieve Goals: Good Pt will go Supine/Side to Sit: with modified independence Pt will go Sit to Supine/Side: with modified independence PT Goal: Sit to Supine/Side - Progress: Progressing toward goal Pt will go Sit to Stand: with modified independence PT Goal: Sit to Stand - Progress: Progressing toward goal Pt will go Stand to Sit: with modified independence PT Goal: Stand to Sit - Progress: Progressing toward goal Pt will Transfer Bed to Chair/Chair to Bed: with modified independence PT Transfer Goal: Bed to Chair/Chair to Bed - Progress: Progressing toward goal Pt will Ambulate: >150 feet;with modified independence;with least restrictive assistive device PT Goal: Ambulate - Progress: Progressing toward goal Pt will Go Up / Down Stairs: Flight;with supervision;with rail(s)  Visit Information  Last PT Received On: 12/27/11 Assistance Needed: +1    Subjective Data      Cognition  Overall Cognitive Status: Appears within functional limits for tasks assessed/performed Arousal/Alertness: Awake/alert Orientation Level: Appears intact for tasks assessed Behavior During Session: [facility] for tasks performed    Balance     End of Session PT - End of Session Equipment Utilized During Treatment: Gait belt;Back brace Activity Tolerance: Patient tolerated treatment well Patient left: in bed;with call bell/phone within reach Nurse Communication: Mobility status     Verdell Face, [place] [phone] 12/27/2011

## 2011-12-27 NOTE — Progress Notes (Signed)
Patient complained to therapy that his catheter feels even more uncomfortable than this morning.  This nurse deflated the balloon of sterile water and inserted the foley forward slightly.  Reinflated balloon; patient reports feeling uncomfortable.  Encouraged some deep breathing.  The tip of his penis has some dried blood on it.  Patient reports having some blood at the tip previously; he cleaned the site himself.  After five minutes, patient stated the pain seems to have abated but would report continued discomfort. Will continue to monitor.

## 2011-12-27 NOTE — Progress Notes (Signed)
Subjective: Patient reports rough night with lots of pain  Objective: Vital signs in last 24 hours: Temp:  [97.4 F (36.3 C)-98.2 F (36.8 C)] 98 F (36.7 C) (12/22 0530) Pulse Rate:  [64-72] 70  (12/22 0530) Resp:  [18-20] 20  (12/22 0530) BP: (107-152)/(53-74) 147/74 mmHg (12/22 0530) SpO2:  [94 %-100 %] 94 % (12/22 0530)  Intake/Output from previous day: 12/21 0701 - 12/22 0700 In: 1263 [P.O.:1260; I.V.:3] Out: 5580 [Urine:5500; Drains:80] Intake/Output this shift:    Physical Exam: Dressing CDI.  MAEW.  Lab Results: No results found for this basename: WBC:2,HGB:2,HCT:2,PLT:2 in the last 72 hours BMET No results found for this basename: NA:2,K:2,CL:2,CO2:2,GLUCOSE:2,BUN:2,CREATININE:2,CALCIUM:2 in the last 72 hours  Studies/Results: Dg Lumbar Spine 2-3 Views  12/25/2011  *RADIOLOGY REPORT*  Clinical Data: L5-S1 posterior lumbar interbody fusion.  DG C-ARM 1-60 MIN,LUMBAR SPINE - 2-3 VIEW  Technique: AP and lateral intraoperative fluoroscopic spot films.  Comparison:  MRI 12/10/2011.  Findings: AP and lateral spot films demonstrate placement of pedicle screws at L5 and S1.  IMPRESSION: Pedicle screw placement at L5 and S1.   Original Report Authenticated By: Andreas Newport, M.D.    Dg C-arm 1-60 Min  12/25/2011  *RADIOLOGY REPORT*  Clinical Data: L5-S1 posterior lumbar interbody fusion.  DG C-ARM 1-60 MIN,LUMBAR SPINE - 2-3 VIEW  Technique: AP and lateral intraoperative fluoroscopic spot films.  Comparison:  MRI 12/10/2011.  Findings: AP and lateral spot films demonstrate placement of pedicle screws at L5 and S1.  IMPRESSION: Pedicle screw placement at L5 and S1.   Original Report Authenticated By: Andreas Newport, M.D.     Assessment/Plan: Mobilize with PT.  Continue Foley.    LOS: 2 days    Dorian Heckle, MD 12/27/2011, 7:12 AM

## 2011-12-27 NOTE — Plan of Care (Signed)
Problem: Phase II Progression Outcomes Goal: Verbalizes of donning/doffing brace Outcome: Completed/Met Date Met:  12/27/11 Patient gives return demonstration of how to don and remove brace by himself. Goal: Understands assist devices with ambulation Outcome: Completed/Met Date Met:  12/27/11 Patient utilizes walker for stability when ambulating. Goal: PT/OT consults completed Outcome: Completed/Met Date Met:  12/27/11 Consults completed and plan of care addressed.

## 2011-12-27 NOTE — Plan of Care (Signed)
Problem: Phase III Progression Outcomes Goal: Demonstrates donning/doffing brace Outcome: Adequate for Discharge Able to perform function; don and tighten without difficulty. Goal: Demonstrates proper use of assistive devices Outcome: Adequate for Discharge Patient able to utilize walker at appropriate height to remain steady when ambulating. Goal: Discharge plan remains appropriate-arrangements made Outcome: Completed/Met Date Met:  12/27/11 Patient will return to home.  Will have instructions from therapy for exercises for strengthening at home.

## 2011-12-27 NOTE — Progress Notes (Signed)
Occupational Therapy Evaluation Patient Details Name: ILIJAH DOUCET MRN: [id number] DOB: [birth year] Today's Date: 12/27/2011 Time: 4098-1191 OT Time Calculation (min): 19 min  OT Assessment / Plan / Recommendation Clinical Impression  56 yo s/p PLF L5 S1. Completed all education regarding back precautions and ADL and functional mobility for ADL. Pt transferring @ Mod I level. wife and pt feel comfortable wit D/C tomorrow. Pt ready to D/C from Ot standpoint.    OT Assessment  Patient does not need any further OT services    Follow Up Recommendations  No OT follow up    Barriers to Discharge      Equipment Recommendations  Tub/shower seat    Recommendations for Other Services    Frequency       Precautions / Restrictions Precautions Precautions: Back Precaution Comments: able to recall 3/3 precautions Required Braces or Orthoses: Spinal Brace Spinal Brace: Lumbar corset;Applied in sitting position   Pertinent Vitals/Pain 5. nsg notified    ADL  Grooming: Supervision/safety Where Assessed - Grooming: Unsupported standing Upper Body Bathing: Supervision/safety;Set up Where Assessed - Upper Body Bathing: Unsupported sitting Lower Body Bathing: Supervision/safety;Set up Where Assessed - Lower Body Bathing: Unsupported sit to stand Upper Body Dressing: Set up;Supervision/safety Where Assessed - Upper Body Dressing: Unsupported sitting Lower Body Dressing: Set up;Supervision/safety Where Assessed - Lower Body Dressing: Unsupported sit to stand Toilet Transfer: Modified independent Toilet Transfer Method: Sit to stand Toilet Transfer Equipment: Comfort height toilet Toileting - Clothing Manipulation and Hygiene: Supervision/safety Where Assessed - Engineer, mining and Hygiene: Sit to stand from 3-in-1 or toilet Tub/Shower Transfer: Supervision/safety Tub/Shower Transfer Method: Science writer: Shower seat with back Equipment Used:  Back brace;Gait belt;Rolling walker Transfers/Ambulation Related to ADLs: Mod I ADL Comments: Good demonstration of back precautions. Completed education regarding ADL, available AE and DME needs. 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    OT Diagnosis:    OT Problem List:   OT Treatment Interventions:     OT Goals Acute Rehab OT Goals OT Goal Formulation:  (eval only)  Visit Information  Last OT Received On: 12/27/11 Assistance Needed: +1    Subjective Data      Prior Functioning     Home Living Lives With: Spouse Available Help at Discharge: Family;Available 24 hours/day Type of Home: House Home Access: Stairs to enter [employer] of Steps: 2 Entrance Stairs-Rails: None Home Layout: Two level Alternate Level Stairs-Number of Steps: 13 Alternate Level Stairs-Rails: Left Bathroom Shower/Tub: Walk-in shower;Door [employer] Toilet: Pharmacist, community: Yes How Accessible: Accessible via walker Home Adaptive Equipment: None Prior Function Level of Independence: Independent Able to Take Stairs?: Yes Driving: Yes Vocation: Full time employment Communication Communication: No difficulties Dominant Hand: Left         Vision/Perception     Cognition  Overall Cognitive Status: Appears within functional limits for tasks assessed/performed Arousal/Alertness: Awake/alert Orientation Level: Appears intact for tasks assessed Behavior During Session: [facility] for tasks performed    Extremity/Trunk Assessment Right Upper Extremity Assessment RUE ROM/Strength/Tone: [facility] for tasks assessed Left Upper Extremity Assessment LUE ROM/Strength/Tone: WFL for tasks assessed Right Lower Extremity Assessment RLE ROM/Strength/Tone: [facility] for tasks assessed Left Lower Extremity Assessment LLE ROM/Strength/Tone: [facility] for tasks assessed Trunk Assessment Trunk Assessment: Other exceptions (back surgery)     Mobility Bed Mobility Bed Mobility: Supine to Sit;Right  Sidelying to Sit;Sitting - Scoot to Edge of Bed Rolling Left: 6: Modified independent (Device/Increase time) Right Sidelying to Sit: 6: Modified independent (Device/Increase time)  Transfers Transfers: Sit to Stand;Stand to Sit Sit to Stand: 6: Modified independent (Device/Increase time);With upper extremity assist;From bed Stand to Sit: 6: Modified independent (Device/Increase time);With upper extremity assist;To bed Details for Transfer Assistance: ok for wife to walk with pt     Shoulder Instructions     Exercise     Balance  WFL   End of Session OT - End of Session Equipment Utilized During Treatment: Gait belt;Back brace Activity Tolerance: Patient tolerated treatment well Patient left: in chair;with call bell/phone within reach;with family/visitor present Nurse Communication: Mobility status;Other (comment) (okfor wife to ambulate with pt)  GO     Lelon Ikard,HILLARY 12/27/2011, 6:41 PM [facility], OTR/L  [phone] 12/27/2011

## 2011-12-27 NOTE — Plan of Care (Signed)
Problem: Phase II Progression Outcomes Goal: Progress activity as tolerated unless otherwise ordered Outcome: Progressing Patient reports some discomfort with his foley when sitting up in his chair.  However, does report that walking seems to improve discomfort.

## 2011-12-28 NOTE — Discharge Summary (Signed)
Physician Discharge Summary  Patient ID: TITUS DRONE MRN: [id number] DOB/AGE: Mar 10, 1955 56 y.o.  Admit date: 12/25/2011 Discharge date: 12/28/2011  Admission Diagnoses:lumbar spondylolisthesis. Urethral stenosis  Discharge Diagnoses: same   Discharged Condition: no weakness  Hospital Course: surgery  Consults: urology  Significant Diagnostic Studies: myelogram  Treatments: l5s1 fusion. Foley insertion by gu  Discharge Exam: Blood pressure 103/54, pulse 71, temperature 97.9 F (36.6 C), temperature source Oral, resp. rate 16, height 6\' 3"  (1.905 m), weight 96.9 kg (213 lb 10 oz), SpO2 93.00%. No pain, no weakness. Foley in place  Disposition: home. To see me in 3 weeks , to see the urologist in 10 days     Medication List     As of 12/28/2011  9:50 AM    ASK your doctor about these medications         amLODipine 5 MG tablet   Commonly known as: NORVASC   Take 5 mg by mouth daily.      aspirin 81 MG tablet   Take 81 mg by mouth daily.      diazepam 5 MG tablet   Commonly known as: VALIUM   Take 5 mg by mouth daily as needed. For spasms  Usually takes every other day if needed      fenofibrate 145 MG tablet   Commonly known as: TRICOR   Take 1 tablet (145 mg total) by mouth daily.      FLAX SEED OIL PO   Take 1 capsule by mouth 3 (three) times daily.      meloxicam 15 MG tablet   Commonly known as: MOBIC   Take 15 mg by mouth daily.      multivitamin tablet   Take 1 tablet by mouth 2 (two) times daily.      oxyCODONE 15 MG immediate release tablet   Commonly known as: ROXICODONE   Take 15 mg by mouth every 4 (four) hours as needed. For pain      ramipril 2.5 MG tablet   Commonly known as: ALTACE   Take 2.5 mg by mouth daily.      zolpidem 10 MG tablet   Commonly known as: AMBIEN   Take 5 mg by mouth at bedtime as needed. For sleep         Signed: Karn Cassis 12/28/2011, 9:50 AM

## 2011-12-28 NOTE — Care Management Note (Signed)
    Page 1 of 1   12/28/2011     12:36:41 PM   CARE MANAGEMENT NOTE 12/28/2011  Patient:  [name], [name]   Account Number:  [id number]  Date Initiated:  12/28/2011  Documentation initiated by:  [facility]  Subjective/Objective Assessment:   Admitted postop L5-S1 PLIF     Action/Plan:   PT/OT evals-no follow up recommended   Anticipated DC Date:  12/29/2011   Anticipated DC Plan:  HOME/SELF CARE      DC Planning Services  CM consult      Choice offered to / List presented to:     DME arranged  Levan Hurst      DME agency  [facility]        Status of service:  Completed, signed off Medicare Important Message given?   (If response is "NO", the following Medicare IM given date fields will be blank) Date Medicare IM given:   Date Additional Medicare IM given:    Discharge Disposition:  HOME/SELF CARE  Per UR Regulation:  Reviewed for med. necessity/level of care/duration of stay  If discussed at Long Length of Stay Meetings, dates discussed:    Comments:

## 2011-12-31 MED FILL — Heparin Sodium (Porcine) Inj 1000 Unit/ML: INTRAMUSCULAR | Qty: 30 | Status: AC

## 2011-12-31 MED FILL — Sodium Chloride Irrigation Soln 0.9%: Qty: 3000 | Status: AC

## 2011-12-31 MED FILL — Sodium Chloride IV Soln 0.9%: INTRAVENOUS | Qty: 1000 | Status: AC

## 2012-02-03 ENCOUNTER — Other Ambulatory Visit: Payer: Self-pay

## 2012-02-03 MED ORDER — AMLODIPINE BESYLATE 5 MG PO TABS: 5.0000 mg | ORAL_TABLET | Freq: Every day | ORAL | Status: DC

## 2012-02-05 ENCOUNTER — Other Ambulatory Visit: Payer: Self-pay | Admitting: *Deleted

## 2012-02-05 MED ORDER — AMLODIPINE BESYLATE 5 MG PO TABS: 5.0000 mg | ORAL_TABLET | Freq: Every day | ORAL | Status: DC

## 2012-02-05 NOTE — Telephone Encounter (Signed)
Patient called wanting rx sent to [facility] not [facility]. Amlodipine 5mg  once by mouth daily with  #90, 2 refills     Micki Riley  CMA

## 2012-03-09 ENCOUNTER — Telehealth: Payer: Self-pay | Admitting: Cardiology

## 2012-03-09 ENCOUNTER — Telehealth: Payer: Self-pay

## 2012-03-09 NOTE — Telephone Encounter (Signed)
Called and left a message for patient to return phone call about medications

## 2012-03-09 NOTE — Telephone Encounter (Signed)
New Prob   Pt called in returning phone call from earlier.

## 2012-03-10 ENCOUNTER — Telehealth: Payer: Self-pay

## 2012-03-10 MED ORDER — RAMIPRIL 2.5 MG PO TABS: 2.5000 mg | ORAL_TABLET | Freq: Every day | ORAL | Status: DC

## 2012-03-10 NOTE — Telephone Encounter (Signed)
Patient called stated he needed refill on ramipril.Prescription sent to pharmacy.

## 2012-03-10 NOTE — Telephone Encounter (Signed)
See previous 03/10/12 note.

## 2012-03-10 NOTE — Telephone Encounter (Signed)
New Prob   Pt has a question regarding a medication he is currently on (he doesn't remember what it is called). Would like to speak to nurse.

## 2012-05-06 ENCOUNTER — Encounter: Payer: Self-pay | Admitting: Cardiology

## 2012-10-24 ENCOUNTER — Other Ambulatory Visit: Payer: Self-pay | Admitting: Cardiology

## 2013-02-09 ENCOUNTER — Other Ambulatory Visit: Payer: Self-pay | Admitting: Cardiology

## 2013-02-16 ENCOUNTER — Other Ambulatory Visit: Payer: Self-pay | Admitting: Cardiology

## 2013-03-02 ENCOUNTER — Other Ambulatory Visit: Payer: Self-pay | Admitting: Cardiology

## 2013-03-14 ENCOUNTER — Other Ambulatory Visit: Payer: Self-pay | Admitting: Cardiology

## 2013-03-16 ENCOUNTER — Other Ambulatory Visit: Payer: Self-pay | Admitting: Cardiology

## 2013-04-10 ENCOUNTER — Other Ambulatory Visit: Payer: Self-pay | Admitting: Cardiology

## 2013-04-13 MED ORDER — RAMIPRIL 2.5 MG PO CAPS: 2.5000 mg | ORAL_CAPSULE | Freq: Every day | ORAL | Status: DC

## 2013-04-13 NOTE — Addendum Note (Signed)
Addended by: Meda Klinefelter D on: 04/13/2013 05:48 PM   Modules accepted: Orders

## 2013-05-08 ENCOUNTER — Other Ambulatory Visit: Payer: Self-pay | Admitting: Cardiology

## 2013-06-01 ENCOUNTER — Ambulatory Visit: Payer: BC Managed Care – PPO | Admitting: Cardiology

## 2013-06-05 ENCOUNTER — Other Ambulatory Visit: Payer: Self-pay | Admitting: Cardiology

## 2013-06-08 ENCOUNTER — Other Ambulatory Visit: Payer: Self-pay

## 2013-06-08 ENCOUNTER — Encounter: Payer: Self-pay | Admitting: Cardiology

## 2013-06-08 ENCOUNTER — Ambulatory Visit (INDEPENDENT_AMBULATORY_CARE_PROVIDER_SITE_OTHER): Payer: BC Managed Care – PPO | Admitting: Cardiology

## 2013-06-08 VITALS — BP 142/90 | HR 75 | Resp 18 | Ht 75.0 in | Wt 223.1 lb

## 2013-06-08 DIAGNOSIS — E7800 Pure hypercholesterolemia, unspecified: Secondary | ICD-10-CM

## 2013-06-08 DIAGNOSIS — I10 Essential (primary) hypertension: Secondary | ICD-10-CM

## 2013-06-08 DIAGNOSIS — I2510 Atherosclerotic heart disease of native coronary artery without angina pectoris: Secondary | ICD-10-CM

## 2013-06-08 DIAGNOSIS — I447 Left bundle-branch block, unspecified: Secondary | ICD-10-CM

## 2013-06-08 LAB — HEPATIC FUNCTION PANEL
ALT: 29 U/L (ref 0–53)
AST: 28 U/L (ref 0–37)
Albumin: 4 g/dL (ref 3.5–5.2)
Alkaline Phosphatase: 42 U/L (ref 39–117)
Bilirubin, Direct: 0.1 mg/dL (ref 0.0–0.3)
Total Bilirubin: 0.7 mg/dL (ref 0.2–1.2)
Total Protein: 6.8 g/dL (ref 6.0–8.3)

## 2013-06-08 LAB — LIPID PANEL
Cholesterol: 151 mg/dL (ref 0–200)
HDL: 39.2 mg/dL (ref >39.00)
LDL Cholesterol: 101 mg/dL — ABNORMAL HIGH (ref 0–99)
NonHDL: 111.8
Total CHOL/HDL Ratio: 4
Triglycerides: 55 mg/dL (ref 0.0–149.0)
VLDL: 11 mg/dL (ref 0.0–40.0)

## 2013-06-08 LAB — BASIC METABOLIC PANEL
BUN: 21 mg/dL (ref 6–23)
CO2: 30 mEq/L (ref 19–32)
Calcium: 9.9 mg/dL (ref 8.4–10.5)
Chloride: 105 mEq/L (ref 96–112)
Creatinine, Ser: 1.4 mg/dL (ref 0.4–1.5)
GFR: 56.51 mL/min — ABNORMAL LOW (ref >60.00)
Glucose, Bld: 105 mg/dL — ABNORMAL HIGH (ref 70–99)
Potassium: 4.7 mEq/L (ref 3.5–5.1)
Sodium: 139 mEq/L (ref 135–145)

## 2013-06-08 MED ORDER — RAMIPRIL 2.5 MG PO CAPS: 2.5000 mg | ORAL_CAPSULE | Freq: Every day | ORAL | Status: DC

## 2013-06-08 MED ORDER — AMLODIPINE BESYLATE 5 MG PO TABS: 5.0000 mg | ORAL_TABLET | Freq: Every day | ORAL | Status: DC

## 2013-06-08 MED ORDER — FENOFIBRATE 145 MG PO TABS: 145.0000 mg | ORAL_TABLET | Freq: Every day | ORAL | Status: DC

## 2013-06-08 NOTE — Patient Instructions (Signed)
Continue your current medication  Work on exercise and getting weight down  I will see you in one year.

## 2013-06-08 NOTE — Addendum Note (Signed)
Addended by: Tonita Phoenix on: 06/08/2013 08:41 AM   Modules accepted: Orders

## 2013-06-08 NOTE — Progress Notes (Signed)
[name] MarinJames F [name] Date of Birth: [birth year]   History of Present Illness: [name] FearingJames is seen back today for a followup visit. He has a history of coronary disease and is status post stenting of the right coronary. He underwent cardiac catheterization in October 2012 which showed continued patency of the stents and nonobstructive coronary disease. In February 2013 year he underwent cervical neck surgery. He had lumbar fusion in 12/13. He is still limited in his exercise by his back problems. He is no longer having night sweats. He does complain of recurrent sinusitis. He has gained 8 lbs. No chest pain or SOB.  Current Outpatient Prescriptions on File Prior to Visit  Medication Sig Dispense Refill  . amLODipine (NORVASC) 5 MG tablet TAKE ONE TABLET BY MOUTH EVERY DAY  30 tablet  0  . aspirin 81 MG tablet Take 81 mg by mouth daily.        . fenofibrate (TRICOR) 145 MG tablet Take 1 tablet every day  90 tablet  0  . meloxicam (MOBIC) 15 MG tablet Take 15 mg by mouth daily.      . Multiple Vitamin (MULTIVITAMIN) tablet Take 1 tablet by mouth 2 (two) times daily.        . ramipril (ALTACE) 2.5 MG capsule Take 1 capsule (2.5 mg total) by mouth daily.  30 capsule  2  . zolpidem (AMBIEN) 10 MG tablet Take 5 mg by mouth at bedtime as needed. For sleep      . diazepam (VALIUM) 5 MG tablet Take 5 mg by mouth daily as needed. For spasms Usually takes every other day if needed      . Flaxseed, Linseed, (FLAX SEED OIL PO) Take 1 capsule by mouth 3 (three) times daily.       Marland Kitchen. oxyCODONE (ROXICODONE) 15 MG immediate release tablet Take 15 mg by mouth every 4 (four) hours as needed. For pain      . ramipril (ALTACE) 2.5 MG tablet Take 1 tablet (2.5 mg total) by mouth daily.  90 tablet  3   No current facility-administered medications on file prior to visit.    Allergies  Allergen Reactions  . Ezetimibe Other (See Comments)    Joint pain and drops BP  . Niacin Other (See Comments)    Drops BP and severe  .  Statins Other (See Comments)    Severe joint pain and drops BP.    Past Medical History  Diagnosis Date  . Hypertension   . Hypercholesterolemia   . LBBB (left bundle branch block)   . MI (myocardial infarction) 1993    LATERAL  . Varicose veins   . Arthritis   . S/P coronary artery stent placement     RCA  . Pneumonia   . Kidney stone     approx 5 years ago  . Coronary artery disease   . CHF (congestive heart failure)     Past Surgical History  Procedure Laterality Date  . Coronary angioplasty      DIRECT ANGIOPLASTY THE MARGINAL BRANCH  . Coronary stent placement      RCA  . Cardiac catheterization  2009    Stents in RCA patent. 70 to 80% PL, and 50% ostial PD.   Marland Kitchen. Anterior cervical decomp/discectomy fusion  03/03/2011    Procedure: ANTERIOR CERVICAL DECOMPRESSION/DISCECTOMY FUSION 3 LEVELS;  Surgeon: Karn CassisErnesto M Botero, MD;  Location: [facility];  Service: Neurosurgery;  Laterality: N/A;  Cervical three-four Cervical four-five Cervical five-six Anterior cervical  decompression/diskectomy, fusion    History  Smoking status  . Former Smoker -- 2.00 packs/day for 20 years  . Types: Cigarettes  . Quit date: 05/07/1999  Smokeless tobacco  . Not on file    History  Alcohol Use  . 3.6 oz/week  . 6 Cans of beer per week    Comment: weekly    Family History  Problem Relation Age of Onset  . Heart attack Father   . Heart disease Brother   . Breast cancer Mother   . Coronary artery disease Mother     Review of Systems: The review of systems is per the HPI.  All other systems were reviewed and are negative.  Physical Exam: BP 142/90  Pulse 75  Resp 18  Ht 6\' 3"  (1.905 m)  Wt 223 lb 1.9 oz (101.207 kg)  BMI 27.89 kg/m2 Patient is pleasant and in no acute distress. Skin is warm and dry. Color is normal.  HEENT is unremarkable. Normocephalic/atraumatic. PERRL. Sclera are nonicteric. Neck is supple. No masses. No JVD. Lungs are clear. Cardiac exam shows a regular  rate and rhythm. No chest wall pain. Abdomen is soft. Extremities are without edema. Gait and ROM are intact. No gross neurologic deficits noted.   LABORATORY DATA:  Ecg: NSR, LBBB  Assessment / Plan: 1. Coronary disease status post stenting of the right coronary 2009. Cardiac catheterization October 2012 showed nonobstructive disease. We will continue risk factor modification. I will follow up in one year.  2. Hypercholesterolemia. Patient is intolerant to multiple medications. He is on fenofibrate. We will get fasting lab work today including chemistries and lipid panel.   3. Hypertension, controlled.  4. LBBB chronic

## 2013-06-12 ENCOUNTER — Telehealth: Payer: Self-pay | Admitting: Cardiology

## 2013-06-12 NOTE — Telephone Encounter (Signed)
New message  ° ° °Returning [name] call  °

## 2013-06-13 NOTE — Telephone Encounter (Signed)
Returned call to patient lab results given. 

## 2013-12-20 ENCOUNTER — Encounter: Payer: Self-pay | Admitting: Cardiovascular Disease

## 2014-01-18 ENCOUNTER — Encounter (HOSPITAL_COMMUNITY): Payer: Self-pay | Admitting: Neurosurgery

## 2014-04-11 ENCOUNTER — Other Ambulatory Visit: Payer: Self-pay | Admitting: Cardiology

## 2014-05-10 ENCOUNTER — Other Ambulatory Visit: Payer: Self-pay | Admitting: Cardiology

## 2014-05-11 ENCOUNTER — Other Ambulatory Visit: Payer: Self-pay

## 2014-08-30 ENCOUNTER — Ambulatory Visit (INDEPENDENT_AMBULATORY_CARE_PROVIDER_SITE_OTHER): Payer: BLUE CROSS/BLUE SHIELD | Admitting: Cardiology

## 2014-08-30 ENCOUNTER — Encounter: Payer: Self-pay | Admitting: Cardiology

## 2014-08-30 VITALS — BP 142/84 | HR 84 | Ht 75.0 in | Wt 231.8 lb

## 2014-08-30 DIAGNOSIS — I10 Essential (primary) hypertension: Secondary | ICD-10-CM | POA: Diagnosis not present

## 2014-08-30 DIAGNOSIS — I447 Left bundle-branch block, unspecified: Secondary | ICD-10-CM | POA: Diagnosis not present

## 2014-08-30 DIAGNOSIS — E7800 Pure hypercholesterolemia, unspecified: Secondary | ICD-10-CM

## 2014-08-30 DIAGNOSIS — M10072 Idiopathic gout, left ankle and foot: Secondary | ICD-10-CM

## 2014-08-30 DIAGNOSIS — M109 Gout, unspecified: Secondary | ICD-10-CM | POA: Insufficient documentation

## 2014-08-30 DIAGNOSIS — I2510 Atherosclerotic heart disease of native coronary artery without angina pectoris: Secondary | ICD-10-CM | POA: Diagnosis not present

## 2014-08-30 LAB — HEPATIC FUNCTION PANEL
ALT: 27 U/L (ref 9–46)
AST: 24 U/L (ref 10–35)
Albumin: 4.3 g/dL (ref 3.6–5.1)
Alkaline Phosphatase: 36 U/L — ABNORMAL LOW (ref 40–115)
Bilirubin, Direct: 0.1 mg/dL (ref <=0.2)
Indirect Bilirubin: 0.6 mg/dL (ref 0.2–1.2)
Total Bilirubin: 0.7 mg/dL (ref 0.2–1.2)
Total Protein: 6.6 g/dL (ref 6.1–8.1)

## 2014-08-30 LAB — LIPID PANEL
Cholesterol: 184 mg/dL (ref 125–200)
HDL: 43 mg/dL (ref >=40)
LDL Cholesterol: 115 mg/dL (ref <130)
Total CHOL/HDL Ratio: 4.3 Ratio (ref <=5.0)
Triglycerides: 132 mg/dL (ref <150)
VLDL: 26 mg/dL (ref <30)

## 2014-08-30 LAB — BASIC METABOLIC PANEL
BUN: 22 mg/dL (ref 7–25)
CO2: 26 mmol/L (ref 20–31)
Calcium: 9.5 mg/dL (ref 8.6–10.3)
Chloride: 103 mmol/L (ref 98–110)
Creat: 1.15 mg/dL (ref 0.70–1.25)
Glucose, Bld: 103 mg/dL — ABNORMAL HIGH (ref 65–99)
Potassium: 4.1 mmol/L (ref 3.5–5.3)
Sodium: 141 mmol/L (ref 135–146)

## 2014-08-30 LAB — URIC ACID: Uric Acid, Serum: 5.5 mg/dL (ref 4.0–7.8)

## 2014-08-30 MED ORDER — INDOMETHACIN 50 MG PO CAPS: 50.0000 mg | ORAL_CAPSULE | Freq: Three times a day (TID) | ORAL | Status: DC

## 2014-08-30 NOTE — Addendum Note (Signed)
Addended by: Meda Klinefelter D on: 08/30/2014 04:28 PM   Modules accepted: Orders

## 2014-08-30 NOTE — Progress Notes (Signed)
[name] Date of Birth: 1955-07-23   History of Present Illness: [name] is seen for follow up CAD. He has a history of coronary disease and is status post stenting of the right coronary in 2009. He underwent cardiac catheterization in October 2012 which showed continued patency of the stents and nonobstructive coronary disease. In February 2013 year he underwent cervical neck surgery. He had lumbar fusion in 12/13. He is still limited in his exercise by his hip pain. He may need surgery. He has gained 8 lbs. No chest pain or SOB. He developed acute gout flare this week.  Current Outpatient Prescriptions on File Prior to Visit  Medication Sig Dispense Refill  . amLODipine (NORVASC) 5 MG tablet TAKE 1 TABLET BY MOUTH EVERY DAY 90 tablet 1  . aspirin 81 MG tablet Take 81 mg by mouth daily.      . fenofibrate (TRICOR) 145 MG tablet TAKE 1 TABLET BY MOUTH EVERY DAY 90 tablet 1  . Flaxseed, Linseed, (FLAX SEED OIL PO) Take 1 capsule by mouth 3 (three) times daily.     . Multiple Vitamin (MULTIVITAMIN) tablet Take 1 tablet by mouth 2 (two) times daily.      . ramipril (ALTACE) 2.5 MG capsule TAKE ONE CAPSULE BY MOUTH EVERY DAY 90 capsule 1   No current facility-administered medications on file prior to visit.    Allergies  Allergen Reactions  . Ezetimibe Other (See Comments)    Joint pain and drops BP  . Niacin Other (See Comments)    Drops BP and severe  . Statins Other (See Comments)    Severe joint pain and drops BP.    Past Medical History  Diagnosis Date  . Hypertension   . Hypercholesterolemia   . LBBB (left bundle branch block)   . MI (myocardial infarction) 1993    LATERAL  . Varicose veins   . Arthritis   . S/P coronary artery stent placement     RCA  . Pneumonia   . Kidney stone     approx 5 years ago  . Coronary artery disease   . CHF (congestive heart failure)   . Gout     Past Surgical History  Procedure Laterality Date  . Coronary angioplasty     DIRECT ANGIOPLASTY THE MARGINAL BRANCH  . Coronary stent placement      RCA  . Cardiac catheterization  2009    Stents in RCA patent. 70 to 80% PL, and 50% ostial PD.   Marland Kitchen Anterior cervical decomp/discectomy fusion  03/03/2011    Procedure: ANTERIOR CERVICAL DECOMPRESSION/DISCECTOMY FUSION 3 LEVELS;  Surgeon: [name] Cassis, MD;  Location: [facility];  Service: Neurosurgery;  Laterality: N/A;  Cervical three-four Cervical four-five Cervical five-six Anterior cervical decompression/diskectomy, fusion    History  Smoking status  . Former Smoker -- 2.00 packs/day for 20 years  . Types: Cigarettes  . Quit date: 05/07/1999  Smokeless tobacco  . Not on file    History  Alcohol Use  . 3.6 oz/week  . 6 Cans of beer per week    Comment: weekly    Family History  Problem Relation Age of Onset  . Heart attack Father   . Heart disease Brother   . Breast cancer Mother   . Coronary artery disease Mother     Review of Systems: The review of systems is per the HPI.  All other systems were reviewed and are negative.  Physical Exam: BP 142/84 mmHg  Pulse 84  Ht 6\' 3"  (1.905 m)  Wt 105.144 kg (231 lb 12.8 oz)  BMI 28.97 kg/m2 Patient is pleasant and in no acute distress. Skin is warm and dry. Color is normal.  HEENT is unremarkable. Normocephalic/atraumatic. PERRL. Sclera are nonicteric. Neck is supple. No masses. No JVD. Lungs are clear. Cardiac exam shows a regular rate and rhythm. No chest wall pain. Abdomen is soft. Extremities are without edema. Gait and ROM are intact. His left first MTP joint is swollen, red, and painful. No gross neurologic deficits noted.   LABORATORY DATA:  Ecg today shows  NSR, LBBB. No change. I have personally reviewed and interpreted this study.   Assessment / Plan: 1. Coronary disease status post stenting of the right coronary 2009. Cardiac catheterization October 2012 showed nonobstructive disease. He is asymptomatic. We will continue risk factor  modification. I will follow up in one year.  2. Hypercholesterolemia. Patient is intolerant to multiple medications. He is on fenofibrate. We will get fasting lab work today including chemistries and lipid panel. Encourage weight loss and heart healthy diet.   3. Hypertension, controlled.  4. LBBB chronic  5. Acute gout. Will start Indocin 50 mg tid with meals. OTC prilosec or zantac for gastric protection. Will check UA today. Last gout episode 5 yrs ago. If it occurs more frequent consider preventative Rx.

## 2014-08-30 NOTE — Patient Instructions (Signed)
We will check lab work today  Take Indocin 50 mg three times a day with food. You can take OTC prilosec or zantac for stomach upset.  I will see you in one year

## 2014-10-18 ENCOUNTER — Other Ambulatory Visit: Payer: Self-pay | Admitting: Cardiology

## 2014-10-18 NOTE — Telephone Encounter (Signed)
REFILL 

## 2014-11-19 ENCOUNTER — Encounter (HOSPITAL_COMMUNITY): Payer: Self-pay | Admitting: Family Medicine

## 2014-11-19 ENCOUNTER — Telehealth: Payer: Self-pay | Admitting: Cardiology

## 2014-11-19 ENCOUNTER — Emergency Department (HOSPITAL_COMMUNITY): Payer: BLUE CROSS/BLUE SHIELD

## 2014-11-19 ENCOUNTER — Observation Stay (HOSPITAL_COMMUNITY)
Admission: EM | Admit: 2014-11-19 | Discharge: 2014-11-20 | DRG: 287 | Disposition: A | Discharge status: Home / Self Care | Payer: BLUE CROSS/BLUE SHIELD | Attending: Cardiology | Admitting: Cardiology

## 2014-11-19 DIAGNOSIS — I509 Heart failure, unspecified: Secondary | ICD-10-CM | POA: Diagnosis present

## 2014-11-19 DIAGNOSIS — I447 Left bundle-branch block, unspecified: Secondary | ICD-10-CM | POA: Diagnosis not present

## 2014-11-19 DIAGNOSIS — Z7982 Long term (current) use of aspirin: Secondary | ICD-10-CM

## 2014-11-19 DIAGNOSIS — I252 Old myocardial infarction: Secondary | ICD-10-CM | POA: Diagnosis not present

## 2014-11-19 DIAGNOSIS — I25110 Atherosclerotic heart disease of native coronary artery with unstable angina pectoris: Secondary | ICD-10-CM | POA: Diagnosis not present

## 2014-11-19 DIAGNOSIS — Z888 Allergy status to other drugs, medicaments and biological substances status: Secondary | ICD-10-CM | POA: Diagnosis not present

## 2014-11-19 DIAGNOSIS — I110 Hypertensive heart disease with heart failure: Secondary | ICD-10-CM | POA: Diagnosis present

## 2014-11-19 DIAGNOSIS — Z981 Arthrodesis status: Secondary | ICD-10-CM | POA: Diagnosis not present

## 2014-11-19 DIAGNOSIS — Z87891 Personal history of nicotine dependence: Secondary | ICD-10-CM | POA: Diagnosis not present

## 2014-11-19 DIAGNOSIS — M109 Gout, unspecified: Secondary | ICD-10-CM | POA: Diagnosis present

## 2014-11-19 DIAGNOSIS — R079 Chest pain, unspecified: Secondary | ICD-10-CM

## 2014-11-19 DIAGNOSIS — I2510 Atherosclerotic heart disease of native coronary artery without angina pectoris: Secondary | ICD-10-CM | POA: Diagnosis not present

## 2014-11-19 DIAGNOSIS — I10 Essential (primary) hypertension: Secondary | ICD-10-CM | POA: Diagnosis not present

## 2014-11-19 DIAGNOSIS — I200 Unstable angina: Secondary | ICD-10-CM

## 2014-11-19 DIAGNOSIS — E785 Hyperlipidemia, unspecified: Secondary | ICD-10-CM | POA: Diagnosis not present

## 2014-11-19 DIAGNOSIS — I209 Angina pectoris, unspecified: Secondary | ICD-10-CM

## 2014-11-19 DIAGNOSIS — Z955 Presence of coronary angioplasty implant and graft: Secondary | ICD-10-CM | POA: Diagnosis not present

## 2014-11-19 DIAGNOSIS — Z8679 Personal history of other diseases of the circulatory system: Secondary | ICD-10-CM

## 2014-11-19 DIAGNOSIS — E7800 Pure hypercholesterolemia, unspecified: Secondary | ICD-10-CM | POA: Diagnosis present

## 2014-11-19 LAB — BASIC METABOLIC PANEL
Anion gap: 7 (ref 5–15)
BUN: 15 mg/dL (ref 6–20)
CO2: 26 mmol/L (ref 22–32)
Calcium: 9.3 mg/dL (ref 8.9–10.3)
Chloride: 106 mmol/L (ref 101–111)
Creatinine, Ser: 1.12 mg/dL (ref 0.61–1.24)
GFR calc Af Amer: >60 mL/min (ref >60)
GFR calc non Af Amer: >60 mL/min (ref >60)
Glucose, Bld: 94 mg/dL (ref 65–99)
Potassium: 4.4 mmol/L (ref 3.5–5.1)
Sodium: 139 mmol/L (ref 135–145)

## 2014-11-19 LAB — LIPASE, BLOOD: Lipase: 26 U/L (ref 11–51)

## 2014-11-19 LAB — CBC
HCT: 42.5 % (ref 39.0–52.0)
Hemoglobin: 14 g/dL (ref 13.0–17.0)
MCH: 30.6 pg (ref 26.0–34.0)
MCHC: 32.9 g/dL (ref 30.0–36.0)
MCV: 93 fL (ref 78.0–100.0)
Platelets: 239 10*3/uL (ref 150–400)
RBC: 4.57 MIL/uL (ref 4.22–5.81)
RDW: 13.6 % (ref 11.5–15.5)
WBC: 4.9 10*3/uL (ref 4.0–10.5)

## 2014-11-19 LAB — T4, FREE: Free T4: 0.95 ng/dL (ref 0.61–1.12)

## 2014-11-19 LAB — TSH: TSH: 2.062 u[IU]/mL (ref 0.350–4.500)

## 2014-11-19 LAB — URINALYSIS, ROUTINE W REFLEX MICROSCOPIC
Bilirubin Urine: NEGATIVE
Glucose, UA: NEGATIVE mg/dL
Hgb urine dipstick: NEGATIVE
Ketones, ur: NEGATIVE mg/dL
Leukocytes, UA: NEGATIVE
Nitrite: NEGATIVE
Protein, ur: NEGATIVE mg/dL
Specific Gravity, Urine: 1.012 (ref 1.005–1.030)
Urobilinogen, UA: 0.2 mg/dL (ref 0.0–1.0)
pH: 7 (ref 5.0–8.0)

## 2014-11-19 LAB — I-STAT TROPONIN, ED: Troponin i, poc: 0.02 ng/mL (ref 0.00–0.08)

## 2014-11-19 LAB — TROPONIN I: Troponin I: <0.03 ng/mL (ref <0.031)

## 2014-11-19 LAB — MAGNESIUM: Magnesium: 2.1 mg/dL (ref 1.7–2.4)

## 2014-11-19 MED ORDER — SODIUM CHLORIDE 0.9 % IJ SOLN: 3.0000 mL | INTRAMUSCULAR | Status: DC | PRN

## 2014-11-19 MED ORDER — ASPIRIN 81 MG PO CHEW
324.0000 mg | CHEWABLE_TABLET | ORAL | Status: DC
Start: 2014-11-19 — End: 2014-11-20

## 2014-11-19 MED ORDER — SODIUM CHLORIDE 0.9 % WEIGHT BASED INFUSION
1.0000 mL/kg/h | INTRAVENOUS | Status: DC
  Administered 2014-11-20: 1 mL/kg/h via INTRAVENOUS

## 2014-11-19 MED ORDER — ASPIRIN 81 MG PO CHEW
81.0000 mg | CHEWABLE_TABLET | ORAL | Status: AC
  Administered 2014-11-20: 81 mg via ORAL
  Filled 2014-11-19: qty 1

## 2014-11-19 MED ORDER — METOPROLOL TARTRATE 12.5 MG HALF TABLET
12.5000 mg | ORAL_TABLET | Freq: Two times a day (BID) | ORAL | Status: DC
  Administered 2014-11-19 – 2014-11-20 (×2): 12.5 mg via ORAL
  Filled 2014-11-19 (×2): qty 1

## 2014-11-19 MED ORDER — HEPARIN (PORCINE) IN NACL 100-0.45 UNIT/ML-% IJ SOLN
1250.0000 [IU]/h | INTRAMUSCULAR | Status: DC
  Administered 2014-11-19: 1250 [IU]/h via INTRAVENOUS
  Filled 2014-11-19: qty 250

## 2014-11-19 MED ORDER — SODIUM CHLORIDE 0.9 % WEIGHT BASED INFUSION: 3.0000 mL/kg/h | INTRAVENOUS | Status: DC

## 2014-11-19 MED ORDER — RAMIPRIL 2.5 MG PO CAPS
2.5000 mg | ORAL_CAPSULE | Freq: Every day | ORAL | Status: DC
  Administered 2014-11-20: 2.5 mg via ORAL
  Filled 2014-11-19 (×2): qty 1

## 2014-11-19 MED ORDER — HEPARIN BOLUS VIA INFUSION
4000.0000 [IU] | Freq: Once | INTRAVENOUS | Status: AC
  Administered 2014-11-19: 4000 [IU] via INTRAVENOUS
  Filled 2014-11-19: qty 4000

## 2014-11-19 MED ORDER — ZOLPIDEM TARTRATE 5 MG PO TABS
5.0000 mg | ORAL_TABLET | Freq: Every evening | ORAL | Status: DC | PRN
  Administered 2014-11-19: 5 mg via ORAL
  Filled 2014-11-19: qty 1

## 2014-11-19 MED ORDER — FENOFIBRATE 160 MG PO TABS
160.0000 mg | ORAL_TABLET | Freq: Every day | ORAL | Status: DC
  Filled 2014-11-19 (×2): qty 1

## 2014-11-19 MED ORDER — ASPIRIN EC 81 MG PO TBEC: 81.0000 mg | DELAYED_RELEASE_TABLET | Freq: Every day | ORAL | Status: DC

## 2014-11-19 MED ORDER — ONDANSETRON HCL 4 MG/2ML IJ SOLN: 4.0000 mg | Freq: Four times a day (QID) | INTRAMUSCULAR | Status: DC | PRN

## 2014-11-19 MED ORDER — ASPIRIN 81 MG PO TABS: 81.0000 mg | ORAL_TABLET | Freq: Every day | ORAL | Status: DC

## 2014-11-19 MED ORDER — SODIUM CHLORIDE 0.9 % IV SOLN
INTRAVENOUS | Status: DC
  Administered 2014-11-19: 20:00:00 via INTRAVENOUS

## 2014-11-19 MED ORDER — ASPIRIN 300 MG RE SUPP
300.0000 mg | RECTAL | Status: DC
Start: 2014-11-19 — End: 2014-11-20

## 2014-11-19 MED ORDER — SODIUM CHLORIDE 0.9 % IV SOLN: 250.0000 mL | INTRAVENOUS | Status: DC | PRN

## 2014-11-19 MED ORDER — AMLODIPINE BESYLATE 5 MG PO TABS
5.0000 mg | ORAL_TABLET | Freq: Every day | ORAL | Status: DC
  Administered 2014-11-20: 5 mg via ORAL
  Filled 2014-11-19: qty 1

## 2014-11-19 MED ORDER — FAMOTIDINE 20 MG PO TABS
20.0000 mg | ORAL_TABLET | Freq: Every day | ORAL | Status: DC
  Administered 2014-11-19 – 2014-11-20 (×2): 20 mg via ORAL
  Filled 2014-11-19 (×2): qty 1

## 2014-11-19 MED ORDER — SODIUM CHLORIDE 0.9 % IJ SOLN: 3.0000 mL | Freq: Two times a day (BID) | INTRAMUSCULAR | Status: DC

## 2014-11-19 MED ORDER — NITROGLYCERIN 0.4 MG SL SUBL: 0.4000 mg | SUBLINGUAL_TABLET | SUBLINGUAL | Status: DC | PRN

## 2014-11-19 MED ORDER — ACETAMINOPHEN 325 MG PO TABS
650.0000 mg | ORAL_TABLET | ORAL | Status: DC | PRN
  Administered 2014-11-20: 650 mg via ORAL
  Filled 2014-11-19: qty 2

## 2014-11-19 MED ORDER — NITROGLYCERIN 2 % TD OINT
0.5000 [in_us] | TOPICAL_OINTMENT | Freq: Three times a day (TID) | TRANSDERMAL | Status: DC
  Administered 2014-11-19 – 2014-11-20 (×2): 0.5 [in_us] via TOPICAL
  Filled 2014-11-19: qty 30

## 2014-11-19 MED ORDER — ALPRAZOLAM 0.25 MG PO TABS: 0.2500 mg | ORAL_TABLET | Freq: Two times a day (BID) | ORAL | Status: DC | PRN

## 2014-11-19 NOTE — Progress Notes (Signed)
Pt not on statin due to allergy/intolerance is on tricor.

## 2014-11-19 NOTE — Telephone Encounter (Signed)
Note not needed 

## 2014-11-19 NOTE — ED Notes (Addendum)
Pt reports "severe" indigestion x 1 month, despite medication. Pt reports chest pain since Friday with some numbness and tingling in left arm. Pt reports "extensive" back and disk problems as well. Pt reports he take 81 mg asa daily. Pt alert/oriented. NAD. No neuro deficits noted.

## 2014-11-19 NOTE — H&P (Signed)
[name] is an 59 y.o. male.    Primary Cardiologist: Dr. Martinique  PCP: Georges Lynch, MD  Chief Complaint: chest pain  HPI: 59 year old male with hx of CAD with PTCA in 1993 and distal stenting of RCA in 2003.  In 2009 he had NSTEMI and had stent to the mid RCA and PLA  in 2009.   Last cardiac catheterization in October 2012 which showed continued patency of the stents and nonobstructive coronary disease.  He had 40-50% stenosis of LCX and 30% stenosis of LAD.  Other hx Hypercholesterolemia, with intolerance to multiple meds, HTN, chronic LBBB.  Hx gout and cervical neck surgery 2013, now with some hip pain.   Over last several weeks pt with indigestion, abd discomfort and now with Lt chest pain with radiation to Lt arm.- lt hand numbness.  Some nausea associated with this and abd cramping.  He did have similar symptoms in 2009 with RCA stenosis.  This was increased and would come and go over the weekend.  It has awakened him form sleep- the chest pain.. This is mid lt ant chest, similar to previous. He felt his indigestion was increased but prilosec and tums have not helped.  He had a significant episode of dizziness on Thursday that took him to his knee.  He does not have NTG.   Here in ER troponin poc 0.02,  EKG SR LBBB old LBBB.  Currently without pain. Strong family history of CAD.  Younger brother with CABG.  Past Medical History  Diagnosis Date  . Hypertension   . Hypercholesterolemia   . LBBB (left bundle branch block)   . MI (myocardial infarction) ([facility]) 1993    LATERAL  . Varicose veins   . Arthritis   . S/P coronary artery stent placement     RCA  . Pneumonia   . Kidney stone     approx 5 years ago  . Coronary artery disease   . CHF (congestive heart failure) ([facility])   . Gout     Past Surgical History  Procedure Laterality Date  . Coronary angioplasty      DIRECT ANGIOPLASTY THE MARGINAL BRANCH  . Coronary stent placement      RCA  . Cardiac  catheterization  2009    Stents in RCA patent. 70 to 80% PL, and 50% ostial PD.   Marland Kitchen Anterior cervical decomp/discectomy fusion  03/03/2011    Procedure: ANTERIOR CERVICAL DECOMPRESSION/DISCECTOMY FUSION 3 LEVELS;  Surgeon: Floyce Stakes, MD;  Location: [facility];  Service: Neurosurgery;  Laterality: N/A;  Cervical three-four Cervical four-five Cervical five-six Anterior cervical decompression/diskectomy, fusion    Family History  Problem Relation Age of Onset  . Heart attack Father   . Heart disease Brother   . Breast cancer Mother   . Coronary artery disease Mother    Social History:  reports that he quit smoking about 15 years ago. His smoking use included Cigarettes. He has a 40 pack-year smoking history. He does not have any smokeless tobacco history on file. He reports that he drinks about 3.6 oz of alcohol per week. He reports that he does not use illicit drugs.  Allergies:  Allergies  Allergen Reactions  . Ezetimibe Other (See Comments)    Joint pain and drops BP  . Niacin Other (See Comments)    Drops BP and severe  . Statins Other (See Comments)    Severe joint pain and drops  BP.    OUTPATIENT MEDICATIONS: No current facility-administered medications on file prior to encounter.   Current Outpatient Prescriptions on File Prior to Encounter  Medication Sig Dispense Refill  . amLODipine (NORVASC) 5 MG tablet TAKE 1 TABLET BY MOUTH EVERY DAY 90 tablet 3  . aspirin 81 MG tablet Take 81 mg by mouth daily.      . fenofibrate (TRICOR) 145 MG tablet TAKE 1 TABLET BY MOUTH EVERY DAY 90 tablet 3  . ramipril (ALTACE) 2.5 MG capsule Take 1 capsule by mouth every day (Patient taking differently: Take 1 capsule by mouth every day in evening) 90 capsule 3  . indomethacin (INDOCIN) 50 MG capsule Take 1 capsule (50 mg total) by mouth 3 (three) times daily with meals. 50 capsule 0   No longer on indocin    Results for orders placed or performed during the hospital encounter of  11/19/14 (from the past 48 hour(s))  Basic metabolic panel     Status: None   Collection Time: 11/19/14 11:57 AM  Result Value Ref Range   Sodium 139 135 - 145 mmol/L   Potassium 4.4 3.5 - 5.1 mmol/L   Chloride 106 101 - 111 mmol/L   CO2 26 22 - 32 mmol/L   Glucose, Bld 94 65 - 99 mg/dL   BUN 15 6 - 20 mg/dL   Creatinine, Ser 1.12 0.61 - 1.24 mg/dL   Calcium 9.3 8.9 - 10.3 mg/dL   GFR calc non Af Amer >60 >60 mL/min   GFR calc Af Amer >60 >60 mL/min    Comment: (NOTE) The eGFR has been calculated using the CKD EPI equation. This calculation has not been validated in all clinical situations. eGFR's persistently <60 mL/min signify possible Chronic Kidney Disease.    Anion gap 7 5 - 15  CBC     Status: None   Collection Time: 11/19/14 11:57 AM  Result Value Ref Range   WBC 4.9 4.0 - 10.5 K/uL   RBC 4.57 4.22 - 5.81 MIL/uL   Hemoglobin 14.0 13.0 - 17.0 g/dL   HCT 42.5 39.0 - 52.0 %   MCV 93.0 78.0 - 100.0 fL   MCH 30.6 26.0 - 34.0 pg   MCHC 32.9 30.0 - 36.0 g/dL   RDW 13.6 11.5 - 15.5 %   Platelets 239 150 - 400 K/uL  Lipase, blood     Status: None   Collection Time: 11/19/14 11:57 AM  Result Value Ref Range   Lipase 26 11 - 51 U/L  I-stat troponin, ED (not at [facility])     Status: None   Collection Time: 11/19/14 12:12 PM  Result Value Ref Range   Troponin i, poc 0.02 0.00 - 0.08 ng/mL   Comment 3            Comment: Due to the release kinetics of cTnI, a negative result within the first hours of the onset of symptoms does not rule out myocardial infarction with certainty. If myocardial infarction is still suspected, repeat the test at appropriate intervals.    Dg Chest 2 View  11/19/2014  CLINICAL DATA:  Chest pain EXAM: CHEST  2 VIEW COMPARISON:  10/07/2011 FINDINGS: The heart size and mediastinal contours are within normal limits. Both lungs are clear. The visualized skeletal structures are unremarkable. IMPRESSION: No active cardiopulmonary disease.  Electronically Signed   By: Kerby Moors M.D.   On: 11/19/2014 13:07    ROS: General:no colds or fevers, no weight changes Skin:no rashes or  ulcers HEENT:no blurred vision, no congestion CV:see HPI PUL:see HPI HA:FBXU last month some diarrhea and constipation off and on.  No melena, + indigestion, without relief with prilosec or tums.  GU:no hematuria, no dysuria MS:no joint pain, no claudication Neuro:no syncope, + lightheadedness on Thursday Endo:no diabetes, no thyroid disease   Blood pressure 136/70, pulse 69, temperature 98.1 F (36.7 C), temperature source Oral, resp. rate 17, SpO2 98 %. PE: General:Pleasant affect, NAD Skin:Warm and dry, brisk capillary refill HEENT:normocephalic, sclera clear, mucus membranes moist Neck:supple, no JVD, no bruits  Heart:S1S2 RRR without murmur, gallup, rub or click Lungs:clear without rales, rhonchi, or wheezes XYB:FXOV, mild diffuse tenderness, + BS, do not palpate liver spleen or masses Ext:no lower ext edema, 2+ pedal pulses, 2+ radial pulses Neuro:alert and oriented X 3, MAE, follows commands, + facial symmetry    Assessment/Plan  1. Unstable angina, initial troponin neg.  EKG with his chronic LBBB.   MD to see, but admit add IV heparin, serial troponins, check amylase, lipase - admit and plan either nuc or cath tomorrow per MD.   2. CAD-hx of stents to RCA and in 2012 nonobstructive disease of LAD and LCX.  3. HTN contorlled  4. Hyperlipidemia treated but intolerance to statins.  [place] Nurse Practitioner Certified [place] Pager [phone] or after 5pm or weekends call [phone] 11/19/2014, 3:16 PM   The patient was seen, examined and discussed with Cecilie Kicks, NP and I agree with the above.    59 year old male with hx of CAD with PTCA in 1993 and distal stenting of RCA in 2003.  In 2009 he had NSTEMI and had stent to the mid RCA and PLA  in 2009.   Last cardiac catheterization in October  2012 which showed continued patency of the stents and nonobstructive coronary disease.  He had 40-50% stenosis of LCX and 30% stenosis of LAD.  He comes with two weeks of progressive fatigue, epigastric and left sided chest pains radiating to his left arm that are his typical chest pains. They are not related to exertion. ECG shows chronic LBBB and the first troponin is negative. He was compliant with his meds, we will start iv heparin and schedule for a cath.  He was on aspirin and ramipril only. Hypertensive in the ER, we will add metoprolol 12.5 mg po BID. He can't tolerate statin, we will refer him to our lipid clinic once he is discharged.   Dorothy Spark 11/19/2014

## 2014-11-19 NOTE — ED Notes (Addendum)
Pt remains monitored by blood pressure, pulse ox, and 5 lead. Pts family remains at bedside. Pt encouraged to provide urine specimen.

## 2014-11-19 NOTE — ED Notes (Signed)
MD at bedside. 

## 2014-11-19 NOTE — ED Provider Notes (Signed)
CSN: [id number]     Arrival date & time 11/19/14  1141 History   First MD Initiated Contact with Patient 11/19/14 1430     Chief Complaint  Patient presents with  . Chest Pain     (Consider location/radiation/quality/duration/timing/severity/associated sxs/prior Treatment) HPI Comments: Patient is a 59 year old male with history of coronary artery disease with multiple stents. He presents with complaints of chest discomfort. He reports this is been occurring intermittently for the past month. He reports a pressure in the left chest with tingling in his left arm and shoulder. This is associated with nausea, however no shortness of breath or diaphoresis. He denies fevers or chills. He denies any productive cough.  He is a cardiology patient of Dr. Swaziland.  Patient is a 59 y.o. male presenting with chest pain. The history is provided by the patient.  Chest Pain Pain location:  Substernal area Pain quality: tightness   Pain radiates to:  L arm and L shoulder Pain severity:  Moderate Duration:  2 weeks Timing:  Intermittent Progression:  Worsening Chronicity:  New Context: not eating and no movement   Relieved by:  Nothing Worsened by:  Movement Ineffective treatments:  None tried Associated symptoms: abdominal pain     Past Medical History  Diagnosis Date  . Hypertension   . Hypercholesterolemia   . LBBB (left bundle branch block)   . MI (myocardial infarction) ([facility]) 1993    LATERAL  . Varicose veins   . Arthritis   . S/P coronary artery stent placement     RCA  . Pneumonia   . Kidney stone     approx 5 years ago  . Coronary artery disease   . CHF (congestive heart failure) ([facility])   . Gout    Past Surgical History  Procedure Laterality Date  . Coronary angioplasty      DIRECT ANGIOPLASTY THE MARGINAL BRANCH  . Coronary stent placement      RCA  . Cardiac catheterization  2009    Stents in RCA patent. 70 to 80% PL, and 50% ostial PD.   Marland Kitchen Anterior cervical  decomp/discectomy fusion  03/03/2011    Procedure: ANTERIOR CERVICAL DECOMPRESSION/DISCECTOMY FUSION 3 LEVELS;  Surgeon: Karn Cassis, MD;  Location: [facility];  Service: Neurosurgery;  Laterality: N/A;  Cervical three-four Cervical four-five Cervical five-six Anterior cervical decompression/diskectomy, fusion   Family History  Problem Relation Age of Onset  . Heart attack Father   . Heart disease Brother   . Breast cancer Mother   . Coronary artery disease Mother    Social History  Substance Use Topics  . Smoking status: Former Smoker -- 2.00 packs/day for 20 years    Types: Cigarettes    Quit date: 05/07/1999  . Smokeless tobacco: None  . Alcohol Use: 3.6 oz/week    6 Cans of beer per week     Comment: weekly    Review of Systems  Cardiovascular: Positive for chest pain.  Gastrointestinal: Positive for abdominal pain.  All other systems reviewed and are negative.     Allergies  Ezetimibe; Niacin; and Statins  Home Medications   Prior to Admission medications   Medication Sig Start Date End Date Taking? Authorizing Provider  amLODipine (NORVASC) 5 MG tablet TAKE 1 TABLET BY MOUTH EVERY DAY 10/18/14   Peter M Swaziland, MD  aspirin 81 MG tablet Take 81 mg by mouth daily.      Historical Provider, MD  fenofibrate (TRICOR) 145 MG tablet TAKE 1 TABLET  BY MOUTH EVERY DAY 10/18/14   Peter M Swaziland, MD  Flaxseed, Linseed, (FLAX SEED OIL PO) Take 1 capsule by mouth 3 (three) times daily.     Historical Provider, MD  indomethacin (INDOCIN) 50 MG capsule Take 1 capsule (50 mg total) by mouth 3 (three) times daily with meals. 08/30/14   Peter M [place]az[place], MD  Multiple Vitamin (MULTIVITAMIN) tablet Take 1 tablet by mouth 2 (two) times daily.      Historical Provider, MD  ramipril (ALTACE) 2.5 MG capsule Take 1 capsule by mouth every day 10/18/14   Peter M Swaz[place], MD   BP 161/89 mmHg  Pulse 72  Temp(Src) 98.1 F (36.7 C) (Oral)  Resp 18  SpO2 98% Physical Exam  Constitutional:  He is oriented to person, place, and time. He appears well-developed and well-nourished. No distress.  HENT:  Head: Normocephalic and atraumatic.  Neck: Normal range of motion. Neck supple.  Cardiovascular: Normal rate, regular rhythm and normal heart sounds.   No murmur heard. Pulmonary/Chest: Effort normal and breath sounds normal. No respiratory distress. He has no wheezes.  Abdominal: Soft. Bowel sounds are normal. He exhibits no distension. There is tenderness.  There is mild tenderness in the epigastrium with no rebound or guarding.  Musculoskeletal: Normal range of motion. He exhibits no edema.  Neurological: He is alert and oriented to person, place, and time.  Skin: Skin is warm and dry. He is not diaphoretic.  Nursing note and vitals reviewed.   ED Course  Procedures (including critical care time) Labs Review Labs Reviewed  BASIC METABOLIC PANEL  CBC  LIPASE, BLOOD  URINALYSIS, ROUTINE W REFLEX MICROSCOPIC (NOT AT [facility])  Rosezena Sensor, ED    Imaging Review Dg Chest 2 View  11/19/2014  CLINICAL DATA:  Chest pain EXAM: CHEST  2 VIEW COMPARISON:  10/07/2011 FINDINGS: The heart size and mediastinal contours are within normal limits. Both lungs are clear. The visualized skeletal structures are unremarkable. IMPRESSION: No active cardiopulmonary disease. Electronically Signed   By: Signa Kell M.D.   On: 11/19/2014 13:07   I have personally reviewed and evaluated these images and lab results as part of my medical decision-making.   EKG Interpretation   Date/Time:  Monday November 19 2014 11:49:56 EST Ventricular Rate:  80 PR Interval:  200 QRS Duration: 184 QT Interval:  430 QTC Calculation: 495 R Axis:   57 Text Interpretation:  Normal sinus rhythm Left bundle branch block  Abnormal ECG Confirmed by Erion Hermans  MD, Lorenda Grecco ([id number]) on 11/19/2014 2:35:29  PM      MDM   Final diagnoses:  None    Workup reveals negative troponin and an unchanged EKG. Patient  has a history of coronary artery disease with stents and  will be evaluated by cardiology.    Geoffery Lyons, MD 11/19/14 [phone]

## 2014-11-19 NOTE — Progress Notes (Signed)
ANTICOAGULATION CONSULT NOTE - Initial Consult  Pharmacy Consult for heparin Indication: chest pain/ACS  Allergies  Allergen Reactions  . Ezetimibe Other (See Comments)    Joint pain and drops BP  . Niacin Other (See Comments)    Drops BP and severe  . Statins Other (See Comments)    Severe joint pain and drops BP.    Patient Measurements: Height: 6\' 3"  (190.5 cm) Weight: 230 lb (104.327 kg) IBW/kg (Calculated) : 84.5   Vital Signs: Temp: 98.1 F (36.7 C) (11/14 1854) Temp Source: Oral (11/14 1854) BP: 177/84 mmHg (11/14 1854) Pulse Rate: 77 (11/14 1854)  Labs:  Recent Labs  11/19/14 1157  HGB 14.0  HCT 42.5  PLT 239  CREATININE 1.12     Assessment: 59yoM admitted with chest pain, abdominal discomfort and unstable angina.   Initial troponin 0.02 known anti-coagulation PTA H/H stable EKG SR LBBB old LBBB.   Goal of Therapy:  Heparin level 0.3-0.7 units/ml Monitor platelets by anticoagulation protocol: Yes   Plan:  1. Give 4000 units bolus x 1 2. Start heparin infusion at 1250 units/hr 3. Check anti-Xa level in 6 hours and daily while on heparin 4. Continue to monitor H&H and platelets  5. F/u plans for cath   Pollyann Samples, PharmD, BCPS 11/19/2014, 7:34 PM Pager: [phone]

## 2014-11-19 NOTE — ED Notes (Addendum)
Pt here for deep intermittent left chest pain with radiation and numbness and tingling in his left arm. Worse over the last few days. sts taking indigestion meds without relief. sts also issues with abd. sts pain.

## 2014-11-20 ENCOUNTER — Encounter (HOSPITAL_COMMUNITY)
Admission: EM | Disposition: A | Discharge status: Home / Self Care | Payer: Self-pay | Source: Home / Self Care | Attending: Emergency Medicine

## 2014-11-20 ENCOUNTER — Inpatient Hospital Stay (HOSPITAL_COMMUNITY): Payer: BLUE CROSS/BLUE SHIELD

## 2014-11-20 ENCOUNTER — Encounter (HOSPITAL_COMMUNITY): Payer: Self-pay | Admitting: Cardiology

## 2014-11-20 DIAGNOSIS — R079 Chest pain, unspecified: Secondary | ICD-10-CM

## 2014-11-20 DIAGNOSIS — I447 Left bundle-branch block, unspecified: Secondary | ICD-10-CM | POA: Diagnosis not present

## 2014-11-20 DIAGNOSIS — I110 Hypertensive heart disease with heart failure: Secondary | ICD-10-CM | POA: Diagnosis not present

## 2014-11-20 DIAGNOSIS — E7800 Pure hypercholesterolemia, unspecified: Secondary | ICD-10-CM

## 2014-11-20 DIAGNOSIS — I509 Heart failure, unspecified: Secondary | ICD-10-CM | POA: Diagnosis not present

## 2014-11-20 DIAGNOSIS — I2510 Atherosclerotic heart disease of native coronary artery without angina pectoris: Secondary | ICD-10-CM | POA: Diagnosis not present

## 2014-11-20 DIAGNOSIS — I25110 Atherosclerotic heart disease of native coronary artery with unstable angina pectoris: Secondary | ICD-10-CM | POA: Diagnosis not present

## 2014-11-20 DIAGNOSIS — I209 Angina pectoris, unspecified: Secondary | ICD-10-CM

## 2014-11-20 HISTORY — PX: CARDIAC CATHETERIZATION: SHX172

## 2014-11-20 LAB — LIPID PANEL
Cholesterol: 164 mg/dL (ref 0–200)
HDL: 39 mg/dL — ABNORMAL LOW (ref >40)
LDL Cholesterol: 111 mg/dL — ABNORMAL HIGH (ref 0–99)
Total CHOL/HDL Ratio: 4.2 RATIO
Triglycerides: 69 mg/dL (ref <150)
VLDL: 14 mg/dL (ref 0–40)

## 2014-11-20 LAB — BASIC METABOLIC PANEL
Anion gap: 6 (ref 5–15)
BUN: 14 mg/dL (ref 6–20)
CO2: 27 mmol/L (ref 22–32)
Calcium: 9 mg/dL (ref 8.9–10.3)
Chloride: 106 mmol/L (ref 101–111)
Creatinine, Ser: 1.14 mg/dL (ref 0.61–1.24)
GFR calc Af Amer: >60 mL/min (ref >60)
GFR calc non Af Amer: >60 mL/min (ref >60)
Glucose, Bld: 99 mg/dL (ref 65–99)
Potassium: 4.3 mmol/L (ref 3.5–5.1)
Sodium: 139 mmol/L (ref 135–145)

## 2014-11-20 LAB — CBC
HCT: 41.9 % (ref 39.0–52.0)
Hemoglobin: 13.9 g/dL (ref 13.0–17.0)
MCH: 31 pg (ref 26.0–34.0)
MCHC: 33.2 g/dL (ref 30.0–36.0)
MCV: 93.3 fL (ref 78.0–100.0)
Platelets: 234 10*3/uL (ref 150–400)
RBC: 4.49 MIL/uL (ref 4.22–5.81)
RDW: 13.6 % (ref 11.5–15.5)
WBC: 4 10*3/uL (ref 4.0–10.5)

## 2014-11-20 LAB — HEPARIN LEVEL (UNFRACTIONATED): Heparin Unfractionated: 0.49 IU/mL (ref 0.30–0.70)

## 2014-11-20 LAB — HEMOGLOBIN A1C
Hgb A1c MFr Bld: 6.3 % — ABNORMAL HIGH (ref 4.8–5.6)
Mean Plasma Glucose: 134 mg/dL

## 2014-11-20 LAB — TROPONIN I
Troponin I: <0.03 ng/mL (ref <0.031)
Troponin I: <0.03 ng/mL (ref <0.031)

## 2014-11-20 LAB — PROTIME-INR
INR: 1.15 (ref 0.00–1.49)
Prothrombin Time: 14.9 seconds (ref 11.6–15.2)

## 2014-11-20 SURGERY — LEFT HEART CATH AND CORONARY ANGIOGRAPHY

## 2014-11-20 MED ORDER — HEPARIN SODIUM (PORCINE) 5000 UNIT/ML IJ SOLN: 5000.0000 [IU] | Freq: Three times a day (TID) | INTRAMUSCULAR | Status: DC

## 2014-11-20 MED ORDER — METOPROLOL TARTRATE 25 MG PO TABS: 12.5000 mg | ORAL_TABLET | Freq: Two times a day (BID) | ORAL | Status: DC

## 2014-11-20 MED ORDER — SODIUM CHLORIDE 0.9 % WEIGHT BASED INFUSION
3.0000 mL/kg/h | INTRAVENOUS | Status: AC
  Administered 2014-11-20: 3 mL/kg/h via INTRAVENOUS

## 2014-11-20 MED ORDER — MIDAZOLAM HCL 2 MG/2ML IJ SOLN
INTRAMUSCULAR | Status: AC
  Filled 2014-11-20: qty 4

## 2014-11-20 MED ORDER — LIDOCAINE HCL (PF) 1 % IJ SOLN
INTRAMUSCULAR | Status: AC
  Filled 2014-11-20: qty 30

## 2014-11-20 MED ORDER — SODIUM CHLORIDE 0.9 % IJ SOLN: 3.0000 mL | Freq: Two times a day (BID) | INTRAMUSCULAR | Status: DC

## 2014-11-20 MED ORDER — SODIUM CHLORIDE 0.9 % IV SOLN: 250.0000 mL | INTRAVENOUS | Status: DC | PRN

## 2014-11-20 MED ORDER — PANTOPRAZOLE SODIUM 40 MG PO TBEC
40.0000 mg | DELAYED_RELEASE_TABLET | Freq: Every day | ORAL | Status: DC
  Administered 2014-11-20: 40 mg via ORAL

## 2014-11-20 MED ORDER — VERAPAMIL HCL 2.5 MG/ML IV SOLN
INTRAVENOUS | Status: DC | PRN
  Administered 2014-11-20: 11:00:00 via INTRA_ARTERIAL

## 2014-11-20 MED ORDER — VERAPAMIL HCL 2.5 MG/ML IV SOLN
INTRAVENOUS | Status: AC
  Filled 2014-11-20: qty 2

## 2014-11-20 MED ORDER — IOHEXOL 350 MG/ML SOLN
INTRAVENOUS | Status: DC | PRN
  Administered 2014-11-20: 80 mL via INTRA_ARTERIAL

## 2014-11-20 MED ORDER — PANTOPRAZOLE SODIUM 40 MG PO TBEC
40.0000 mg | DELAYED_RELEASE_TABLET | Freq: Every day | ORAL | Status: DC
Start: 2014-11-20 — End: 2015-05-07

## 2014-11-20 MED ORDER — HEPARIN (PORCINE) IN NACL 2-0.9 UNIT/ML-% IJ SOLN
INTRAMUSCULAR | Status: AC
  Filled 2014-11-20: qty 1500

## 2014-11-20 MED ORDER — MIDAZOLAM HCL 2 MG/2ML IJ SOLN
INTRAMUSCULAR | Status: DC | PRN
  Administered 2014-11-20: 1 mg via INTRAVENOUS

## 2014-11-20 MED ORDER — HEPARIN SODIUM (PORCINE) 1000 UNIT/ML IJ SOLN
INTRAMUSCULAR | Status: DC | PRN
  Administered 2014-11-20: 5000 [IU] via INTRAVENOUS

## 2014-11-20 MED ORDER — SODIUM CHLORIDE 0.9 % IJ SOLN: 3.0000 mL | INTRAMUSCULAR | Status: DC | PRN

## 2014-11-20 MED ORDER — FENTANYL CITRATE (PF) 100 MCG/2ML IJ SOLN
INTRAMUSCULAR | Status: AC
  Filled 2014-11-20: qty 4

## 2014-11-20 MED ORDER — HEPARIN SODIUM (PORCINE) 1000 UNIT/ML IJ SOLN
INTRAMUSCULAR | Status: AC
  Filled 2014-11-20: qty 1

## 2014-11-20 MED ORDER — FENTANYL CITRATE (PF) 100 MCG/2ML IJ SOLN
INTRAMUSCULAR | Status: DC | PRN
  Administered 2014-11-20: 25 ug via INTRAVENOUS

## 2014-11-20 MED ORDER — LIDOCAINE HCL (PF) 1 % IJ SOLN
INTRAMUSCULAR | Status: DC | PRN
  Administered 2014-11-20: 12:00:00

## 2014-11-20 SURGICAL SUPPLY — 11 items

## 2014-11-20 NOTE — Progress Notes (Signed)
  Echocardiogram 2D Echocardiogram has been performed.  Delcie Roch 11/20/2014, 3:11 PM

## 2014-11-20 NOTE — Discharge Summary (Signed)
Discharge Summary   Patient ID: [name],  MRN: [id number], DOB/AGE: 1955/06/14 59 y.o.  Admit date: 11/19/2014 Discharge date: 11/20/2014  Primary Care Provider: Mickie Hillier Primary Cardiologist: Dr. Swaziland  Discharge Diagnoses Principal Problem:   Pain in the chest Active Problems:   Hypertension   Hypercholesterolemia   LBBB (left bundle branch block)   CAD (coronary artery disease)   Allergies Allergies  Allergen Reactions  . Ezetimibe Other (See Comments)    Joint pain and drops BP  . Niacin Other (See Comments)    Drops BP and severe  . Statins Other (See Comments)    Severe joint pain and drops BP.    Procedures  Cardiac catheterization 11/20/2014 Conclusion     Prox RCA lesion, 40% stenosed.  Mid RCA to Dist RCA lesion, 10% stenosed. The lesion was previously treated with a stent (unknown type).  Ost Ramus to Ramus lesion, 40% stenosed.  1st Mrg lesion, 35% stenosed.  Ost LAD to Prox LAD lesion, 35% stenosed.  There is mild to moderate left ventricular systolic dysfunction.  1. Nonobstructive CAD. No change compared to cardiac cath in 2012 2. Continued patency of stents in the RCA 3. Mild to moderate LV dysfunction. EF 40-45%.   Plan: continue medical therapy. Anti-reflux therapy. If symptoms persist I would get GI consult to consider for EGD.     Echocardiogram Study Conclusions  - Left ventricle: The cavity size was normal. Wall thickness was increased in a pattern of mild LVH. Systolic function was normal. The estimated ejection fraction was in the range of 50% to 55%. Hypokinesis of the inferoseptal myocardium. Doppler parameters are consistent with abnormal left ventricular relaxation (grade 1 diastolic dysfunction). - Ventricular septum: Septal motion showed abnormal function and dyssynergy. - Aortic valve: There was mild regurgitation. - Left atrium: The atrium was moderately dilated. - Right ventricle: The cavity  size was normal. Wall thickness was normal. Systolic function was normal. - Inferior vena cava: The vessel was normal in size. The respirophasic diameter changes were in the normal range (>= 50%), consistent with normal central venous pressure.    Hospital Course  The patient is a 59 year old male with past medical history of CAD with PTCA in 1993 and a distal stenting of RCA in 2003, hypertension, hyperlipidemia, chronic left bundle branch block. His last cardiac catheterization was in October 2012 which showed continued patency of stents and nonobstructive CAD. He had 40-50% stenosis in left circumflex, and 30% stenosis in LAD. He presented to [facility] on 11/19/2014 complaining of chest pain. He also complained of 2 weeks onset of progressive fatigue, epigastric and left-sided chest pain radiating to left arm that is reminiscent of his previous MI. They're not associated with exertion. EKG showed chronic left bundle branch block. Overnight, his serial troponin was negative. He underwent diagnostic cardiac catheterization on 11/20/2014 which showed 40% proximal RCA lesion,10% mid to distal RCA lesion, 40% ostial ramus lesion, 35% OM1, 35% ostial to proximal LAD, EF 40-45%. Medical therapy was recommended.  Post-cath, patient did well without significant discomfort. Echocardiogram was obtained prior to discharge which is currently pending. Patient is deemed stable for discharge if echocardiogram is normal. Given his chest discomfort, we will start a short trial of PPI. During this admission, low-dose metoprolol was started. 2-4 weeks outpatient cardiology follow-up has been arranged.   Discharge Vitals Blood pressure 109/61, pulse 63, temperature 97.6 F (36.4 C), temperature source Oral, resp. rate 12, height  (1.905 m), weight 225  lb 12.8 oz (102.422 kg), SpO2 100 %.  Filed Weights   11/19/14 1854 11/20/14 0559  Weight: 230 lb (104.327 kg) 225 lb 12.8 oz (102.422 kg)     Labs  CBC  Recent Labs  11/19/14 1157 11/20/14 0800  WBC 4.9 4.0  HGB 14.0 13.9  HCT 42.5 41.9  MCV 93.0 93.3  PLT 239 234   Basic Metabolic Panel  Recent Labs  11/19/14 1157 11/19/14 2002 11/20/14 0800  NA 139  --  139  K 4.4  --  4.3  CL 106  --  106  CO2 26  --  27  GLUCOSE 94  --  99  BUN 15  --  14  CREATININE 1.12  --  1.14  CALCIUM 9.3  --  9.0  MG  --  2.1  --    Liver Function Tests No results for input(s): AST, ALT, ALKPHOS, BILITOT, PROT, ALBUMIN in the last 72 hours.  Recent Labs  11/19/14 1157  LIPASE 26   Cardiac Enzymes  Recent Labs  11/19/14 2002 11/20/14 0108 11/20/14 0800  TROPONINI <0.03 <0.03 <0.03   BNP Invalid input(s): POCBNP D-Dimer No results for input(s): DDIMER in the last 72 hours. Hemoglobin A1C  Recent Labs  11/19/14 2002  HGBA1C 6.3*   Fasting Lipid Panel  Recent Labs  11/20/14 0800  CHOL 164  HDL 39*  LDLCALC 111*  TRIG 69  CHOLHDL 4.2   Thyroid Function Tests  Recent Labs  11/19/14 2002  TSH 2.062    Disposition  Pt is being discharged home today in good condition.  Follow-up Plans & Appointments      Follow-up Information    Follow up with HAGER, BRYAN, PA-C On 12/20/2014.   Specialties:  Physician Assistant, Radiology, Interventional Cardiology   Why:  1:30pm. Cardiology   Contact information:   [address] [place] [address] [phone]       Discharge Medications    Medication List    TAKE these medications        amLODipine 5 MG tablet  Commonly known as:  NORVASC  TAKE 1 TABLET BY MOUTH EVERY DAY     aspirin 81 MG tablet  Take 81 mg by mouth daily.     fenofibrate 145 MG tablet  Commonly known as:  TRICOR  TAKE 1 TABLET BY MOUTH EVERY DAY     indomethacin 50 MG capsule  Commonly known as:  INDOCIN  Take 1 capsule (50 mg total) by mouth 3 (three) times daily with meals.     metoprolol tartrate 25 MG tablet  Commonly known as:  LOPRESSOR   Take 0.5 tablets (12.5 mg total) by mouth 2 (two) times daily.     naproxen sodium 220 MG tablet  Commonly known as:  ANAPROX  Take 220 mg by mouth 2 (two) times daily as needed (for pain).     pantoprazole 40 MG tablet  Commonly known as:  PROTONIX  Take 1 tablet (40 mg total) by mouth daily at 12 noon.     ramipril 2.5 MG capsule  Commonly known as:  ALTACE  Take 1 capsule by mouth every day     ranitidine 75 MG tablet  Commonly known as:  ZANTAC  Take 75 mg by mouth 2 (two) times daily.          Duration of Discharge Encounter   Greater than 30 minutes including physician time.  Ramond Dial PA-C Pager: [phone] 11/20/2014, 4:14 PM

## 2014-11-20 NOTE — Plan of Care (Signed)
Problem: Education: Goal: Knowledge of [facility] Education information/materials will improve Outcome: Progressing Pt a/o x 3 able to communicate needs and discomforts. Pt's wife is visiting no questions or concerns about cardiac catheterization. Will continue to monitor pt status and provide emotional support as needed.

## 2014-11-20 NOTE — Care Management Note (Signed)
Case Management Note  Patient Details  Name: [name] MRN: [id number] Date of Birth: 1955-10-03  Subjective/Objective:  Pt admitted for chest pain- plan for cardiac cath 11-20-14.                   Action/Plan: No needs identified by CM at this time. Will continue to monitor.    Expected Discharge Date:                  Expected Discharge Plan:  Home/Self Care  In-House Referral:     Discharge planning Services  CM Consult  Post Acute Care Choice:    Choice offered to:     DME Arranged:    DME Agency:     HH Arranged:    HH Agency:     Status of Service:  In process, will continue to follow  Medicare Important Message Given:    Date Medicare IM Given:    Medicare IM give by:    Date Additional Medicare IM Given:    Additional Medicare Important Message give by:     If discussed at Long Length of Stay Meetings, dates discussed:    Additional Comments:  Gala Lewandowsky, RN 11/20/2014, 11:08 AM

## 2014-11-20 NOTE — Progress Notes (Signed)
ANTICOAGULATION CONSULT NOTE   Pharmacy Consult for heparin Indication: chest pain/ACS  Allergies  Allergen Reactions  . Ezetimibe Other (See Comments)    Joint pain and drops BP  . Niacin Other (See Comments)    Drops BP and severe  . Statins Other (See Comments)    Severe joint pain and drops BP.    Patient Measurements: Height: 6\' 3"  (190.5 cm) Weight: 230 lb (104.327 kg) IBW/kg (Calculated) : 84.5   Vital Signs: Temp: 97.9 F (36.6 C) (11/14 2110) Temp Source: Oral (11/14 2110) BP: 152/73 mmHg (11/14 2139) Pulse Rate: 72 (11/14 2139)  Labs:  Recent Labs  11/19/14 1157 11/19/14 2002 11/20/14 0108  HGB 14.0  --   --   HCT 42.5  --   --   PLT 239  --   --   HEPARINUNFRC  --   --  0.49  CREATININE 1.12  --   --   TROPONINI  --  <0.03  --      Assessment: 59 y.o. male with chest pain for heparin   Goal of Therapy:  Heparin level 0.3-0.7 units/ml Monitor platelets by anticoagulation protocol: Yes   Plan:  Continue Heparin at current rate  F/U after cath today  Geannie Risen, PharmD, BCPS

## 2014-11-20 NOTE — H&P (View-Only) (Signed)
Subjective: He has had intermittent CP through the night.    Objective: Vital signs in last 24 hours: Temp:  [97.6 F (36.4 C)-98.1 F (36.7 C)] 97.6 F (36.4 C) (11/15 0559) Pulse Rate:  [68-81] 68 (11/15 0549) Resp:  [12-20] 12 (11/15 0549) BP: (114-177)/(67-89) 128/71 mmHg (11/15 0549) SpO2:  [94 %-99 %] 94 % (11/15 0549) Weight:  [225 lb 12.8 oz (102.422 kg)-230 lb (104.327 kg)] 225 lb 12.8 oz (102.422 kg) (11/15 0559) Last BM Date: 11/19/14  Intake/Output from previous day: 11/14 0701 - 11/15 0700 In: 412.7 [P.O.:240; I.V.:172.7] Out: 1050 [Urine:1050] Intake/Output this shift:    Medications Scheduled Meds: . amLODipine  5 mg Oral Daily  . aspirin  324 mg Oral NOW   Or  . aspirin  300 mg Rectal NOW  . aspirin EC  81 mg Oral Daily  . famotidine  20 mg Oral Daily  . fenofibrate  160 mg Oral Daily  . metoprolol tartrate  12.5 mg Oral BID  . nitroGLYCERIN  0.5 inch Topical 3 times per day  . ramipril  2.5 mg Oral Daily  . sodium chloride  3 mL Intravenous Q12H   Continuous Infusions: . sodium chloride 10 mL/hr at 11/20/14 0400  . sodium chloride 1 mL/kg/hr (11/20/14 0703)  . heparin 1,250 Units/hr (11/20/14 0400)   PRN Meds:.sodium chloride, acetaminophen, ALPRAZolam, nitroGLYCERIN, ondansetron (ZOFRAN) IV, sodium chloride, zolpidem  PE: General appearance: alert, cooperative and no distress Lungs: clear to auscultation bilaterally Heart: regular rate and rhythm, S1, S2 normal, no murmur, click, rub or gallop Extremities: No LEE Pulses: 2+ and symmetric Skin: Warm and dry Neurologic: Grossly normal  Lab Results:   Recent Labs  11/19/14 1157  WBC 4.9  HGB 14.0  HCT 42.5  PLT 239   BMET  Recent Labs  11/19/14 1157  NA 139  K 4.4  CL 106  CO2 26  GLUCOSE 94  BUN 15  CREATININE 1.12  CALCIUM 9.3    Assessment/Plan   Principal Problem:   Unstable angina (HCC) Active Problems:   Hypertension   Hypercholesterolemia   LBBB (left  bundle branch block)   CAD (coronary artery disease)      Ruled out for MI.  According to the patient, the pain is not exacerbated with exercise(Goes to the gym frequently), but is reminiscent of previous times when a stent was placed.   Left heart cath planned for today.  On IV heparin, ASA, BB, ACE.  HGB A1C 6.3.  TSH, T4 WNL.   BP controlled.    LOS: 1 day    [name], BRYAN PA-C 11/20/2014 7:53 AM  The patient was seen, examined and discussed with Wilburt Finlay, PA-C and I agree with the above.   59 year old male with hx of CAD with PTCA in 1993 and distal stenting of RCA in 2003. In 2009 he had NSTEMI and had stent to the mid RCA and PLA in 2009. Last cardiac catheterization in October 2012 which showed continued patency of the stents and nonobstructive coronary disease. He had 40-50% stenosis of LCX and 30% stenosis of LAD.  He comes with two weeks of progressive fatigue, epigastric and left sided chest pains radiating to his left arm that are his typical chest pains. They are not related to exertion. ECG shows chronic LBBB and the first troponin is negative. He was compliant with his meds, we will start iv heparin and schedule for a cath. He was on aspirin and ramipril only.  Hypertensive in the ER, we added metoprolol 12.5 mg po BID. He can't tolerate statin, we will refer him to our lipid clinic once he is discharged.  He is scheduled for a left cardiac cath today.  Deshannon Seide H 11/20/2014  

## 2014-11-20 NOTE — Interval H&P Note (Signed)
History and Physical Interval Note:  11/20/2014 11:07 AM  [name]  has presented today for surgery, with the diagnosis of cp  The various methods of treatment have been discussed with the patient and family. After consideration of risks, benefits and other options for treatment, the patient has consented to  Procedure(s): Left Heart Cath and Coronary Angiography (N/A) as a surgical intervention .  The patient's history has been reviewed, patient examined, no change in status, stable for surgery.  I have reviewed the patient's chart and labs.  Questions were answered to the patient's satisfaction.    Cath Lab Visit (complete for each Cath Lab visit)  Clinical Evaluation Leading to the Procedure:   ACS: Yes.    Non-ACS:    Anginal Classification: CCS III  Anti-ischemic medical therapy: Minimal Therapy (1 class of medications)  Non-Invasive Test Results: No non-invasive testing performed  Prior CABG: No previous CABG       Theron Arista [facility] 11/20/2014 11:07 AM

## 2014-11-20 NOTE — Plan of Care (Signed)
Problem: Consults Goal: Chest Pain Patient Education (See Patient Education module for education specifics.) Outcome: Progressing Patient is a new admit and is familiar with heart cath but will continue to educate on possibility of CABG risks and benefits

## 2014-11-20 NOTE — Progress Notes (Signed)
UR Completed Yu Cragun Graves-Bigelow, RN,BSN [phone]  

## 2014-11-20 NOTE — Discharge Instructions (Signed)
No driving for 24 hours. No lifting over 5 lbs for 1 week. No sexual activity for 1 week. Keep procedure site clean & dry. If you notice increased pain, swelling, bleeding or pus, call/return!  You may shower, but no soaking baths/hot tubs/pools for 1 week.  ° ° °

## 2014-11-20 NOTE — Plan of Care (Signed)
Problem: Phase I Progression Outcomes Goal: Hemodynamically stable Outcome: Progressing Patient's blood pressure has been elevated since admission, will continue to monitor and treat as per MD orders

## 2014-11-20 NOTE — Plan of Care (Signed)
Problem: Phase I Progression Outcomes Goal: MD aware of Cardiac Marker results Outcome: Progressing Troponins are ordered for every 6 hours x 3, will continue to monitor. MD aware of results thus far, awaiting pending results

## 2014-11-20 NOTE — Progress Notes (Signed)
Subjective: He has had intermittent CP through the night.    Objective: Vital signs in last 24 hours: Temp:  [97.6 F (36.4 C)-98.1 F (36.7 C)] 97.6 F (36.4 C) (11/15 0559) Pulse Rate:  [68-81] 68 (11/15 0549) Resp:  [12-20] 12 (11/15 0549) BP: (114-177)/(67-89) 128/71 mmHg (11/15 0549) SpO2:  [94 %-99 %] 94 % (11/15 0549) Weight:  [225 lb 12.8 oz (102.422 kg)-230 lb (104.327 kg)] 225 lb 12.8 oz (102.422 kg) (11/15 0559) Last BM Date: 11/19/14  Intake/Output from previous day: 11/14 0701 - 11/15 0700 In: 412.7 [P.O.:240; I.V.:172.7] Out: 1050 [Urine:1050] Intake/Output this shift:    Medications Scheduled Meds: . amLODipine  5 mg Oral Daily  . aspirin  324 mg Oral NOW   Or  . aspirin  300 mg Rectal NOW  . aspirin EC  81 mg Oral Daily  . famotidine  20 mg Oral Daily  . fenofibrate  160 mg Oral Daily  . metoprolol tartrate  12.5 mg Oral BID  . nitroGLYCERIN  0.5 inch Topical 3 times per day  . ramipril  2.5 mg Oral Daily  . sodium chloride  3 mL Intravenous Q12H   Continuous Infusions: . sodium chloride 10 mL/hr at 11/20/14 0400  . sodium chloride 1 mL/kg/hr (11/20/14 0703)  . heparin 1,250 Units/hr (11/20/14 0400)   PRN Meds:.sodium chloride, acetaminophen, ALPRAZolam, nitroGLYCERIN, ondansetron (ZOFRAN) IV, sodium chloride, zolpidem  PE: General appearance: alert, cooperative and no distress Lungs: clear to auscultation bilaterally Heart: regular rate and rhythm, S1, S2 normal, no murmur, click, rub or gallop Extremities: No LEE Pulses: 2+ and symmetric Skin: Warm and dry Neurologic: Grossly normal  Lab Results:   Recent Labs  11/19/14 1157  WBC 4.9  HGB 14.0  HCT 42.5  PLT 239   BMET  Recent Labs  11/19/14 1157  NA 139  K 4.4  CL 106  CO2 26  GLUCOSE 94  BUN 15  CREATININE 1.12  CALCIUM 9.3    Assessment/Plan   Principal Problem:   Unstable angina (HCC) Active Problems:   Hypertension   Hypercholesterolemia   LBBB (left  bundle branch block)   CAD (coronary artery disease)      Ruled out for MI.  According to the patient, the pain is not exacerbated with exercise(Goes to the gym frequently), but is reminiscent of previous times when a stent was placed.   Left heart cath planned for today.  On IV heparin, ASA, BB, ACE.  HGB A1C 6.3.  TSH, T4 WNL.   BP controlled.    LOS: 1 day    HAGER, BRYAN PA-C 11/20/2014 7:53 AM  The patient was seen, examined and discussed with Wilburt Finlay, PA-C and I agree with the above.   59 year old male with hx of CAD with PTCA in 1993 and distal stenting of RCA in 2003. In 2009 he had NSTEMI and had stent to the mid RCA and PLA in 2009. Last cardiac catheterization in October 2012 which showed continued patency of the stents and nonobstructive coronary disease. He had 40-50% stenosis of LCX and 30% stenosis of LAD.  He comes with two weeks of progressive fatigue, epigastric and left sided chest pains radiating to his left arm that are his typical chest pains. They are not related to exertion. ECG shows chronic LBBB and the first troponin is negative. He was compliant with his meds, we will start iv heparin and schedule for a cath. He was on aspirin and ramipril only.  Hypertensive in the ER, we added metoprolol 12.5 mg po BID. He can't tolerate statin, we will refer him to our lipid clinic once he is discharged.  He is scheduled for a left cardiac cath today.  Lars Masson 11/20/2014

## 2014-12-17 ENCOUNTER — Telehealth: Payer: Self-pay | Admitting: Cardiology

## 2014-12-17 ENCOUNTER — Other Ambulatory Visit: Payer: Self-pay | Admitting: Physician Assistant

## 2014-12-17 NOTE — Telephone Encounter (Signed)
Returned call to patient.He stated he needs appointment with Dr.Peters at [facility].After reviewing chart I mentioned he cancelled post hospital appointment with Wilburt Finlay PA.I mentioned he needs to keep post hospital appointment and a GI appointment can be made at visit.Patient stated he did not need a post hospital appointment he had other appointments and he did not need Dr.Jordan.Patient hung up phone.

## 2014-12-17 NOTE — Telephone Encounter (Signed)
[name] is calling because Dr. Swaziland says he would refer him to a Gastroentrologist and he is wanting to know if he can refer him to Dr. Noe Gens of [facility] Gastroenterologist   Thanks

## 2014-12-20 ENCOUNTER — Ambulatory Visit: Payer: BLUE CROSS/BLUE SHIELD | Admitting: Physician Assistant

## 2014-12-24 ENCOUNTER — Encounter: Payer: Self-pay | Admitting: Gastroenterology

## 2015-02-11 ENCOUNTER — Ambulatory Visit (INDEPENDENT_AMBULATORY_CARE_PROVIDER_SITE_OTHER): Payer: BLUE CROSS/BLUE SHIELD | Admitting: Gastroenterology

## 2015-02-11 ENCOUNTER — Other Ambulatory Visit: Payer: Self-pay | Admitting: Gastroenterology

## 2015-02-11 ENCOUNTER — Encounter: Payer: Self-pay | Admitting: Gastroenterology

## 2015-02-11 VITALS — BP 112/80 | HR 100 | Ht 72.0 in | Wt 232.5 lb

## 2015-02-11 DIAGNOSIS — R1033 Periumbilical pain: Secondary | ICD-10-CM | POA: Diagnosis not present

## 2015-02-11 DIAGNOSIS — K529 Noninfective gastroenteritis and colitis, unspecified: Secondary | ICD-10-CM | POA: Diagnosis not present

## 2015-02-11 MED ORDER — HYOSCYAMINE SULFATE 0.125 MG PO TABS: 0.1250 mg | ORAL_TABLET | Freq: Four times a day (QID) | ORAL | Status: DC | PRN

## 2015-02-11 NOTE — Progress Notes (Signed)
Patient ID: [name], male   DOB: [birth year], 60 y.o.   MRN: [id number] Gastroenterology and Hepatology Consult Note:  History: [name] 02/11/2015  Referring physician: Peter Swaziland, MD  Reason for consult/chief complaint: Abdominal Pain; Diarrhea; and Nausea and vomiting   Subjective HPI:   In October he had a few days of dull non-radiating periumbilical pain followed by acute onset severe n/v/d that lasted a few days.  Initial episode improved, but never back to normal. Still has 5-6 semi formed to loose stools daily for 4-5 days, then no bm for 1-2 days, then cycle continues.  Still has periumbilical cramps before BM, which may have urgency.  Stools small volume, non-bloody, often feels has to go again 20 min later.  Perhaps improved a little after a colon cleanse, o/w no clear triggers/relieving factors.  No better off lactose. + nocturnal diarrhea few days/wk    ROS:  Constitutional:  No weight loss, energy level good Remainder of systems negative except as noted above   Past Medical History: Past Medical History  Diagnosis Date  . Hypertension   . Hypercholesterolemia   . LBBB (left bundle branch block)   . MI (myocardial infarction) (HCC) 1993    LATERAL  . Varicose veins   . Arthritis   . S/P coronary artery stent placement     RCA  . Pneumonia   . Kidney stone     approx 5 years ago  . Coronary artery disease   . CHF (congestive heart failure) (HCC)   . Gout     CAD stents starting age 20  Past Surgical History: Past Surgical History  Procedure Laterality Date  . Coronary angioplasty      DIRECT ANGIOPLASTY THE MARGINAL BRANCH  . Coronary stent placement      RCA  . Cardiac catheterization  2009    Stents in RCA patent. 70 to 80% PL, and 50% ostial PD.   Marland Kitchen Anterior cervical decomp/discectomy fusion  03/03/2011    Procedure: ANTERIOR CERVICAL DECOMPRESSION/DISCECTOMY FUSION 3 LEVELS;  Surgeon: Karn Cassis, MD;  Location: [facility] NEURO ORS;   Service: Neurosurgery;  Laterality: N/A;  Cervical three-four Cervical four-five Cervical five-six Anterior cervical decompression/diskectomy, fusion  . Cardiac catheterization N/A 11/20/2014    Procedure: Left Heart Cath and Coronary Angiography;  Surgeon: Peter M Swaziland, MD;  Location: [facility] INVASIVE CV LAB;  Service: Cardiovascular;  Laterality: N/A;  . Lumbar disc surgery       Family History: Family History  Problem Relation Age of Onset  . Heart attack Father   . Heart disease Brother   . Breast cancer Mother   . Coronary artery disease Mother   . Diabetes Father   . Heart attack Maternal Grandfather    No IBD or sprue Social History: Social History   Social History  . Marital Status: Married    Spouse Name: N/A  . Number of Children: 1  . Years of Education: N/A   Occupational History  . 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 Smoking status: Former Smoker -- 2.00 packs/day for 20 years    Types: Cigarettes    Quit date: 05/07/1999  . Smokeless tobacco: Never Used  . Alcohol Use: 3.6 oz/week    6 Cans of beer per week     Comment: occasional  . Drug Use: No  . Sexual Activity: Not Asked   Other Topics Concern  . None   Social History Narrative  Allergies: Allergies  Allergen Reactions  . Ezetimibe Other (See Comments)    Joint pain and drops BP  . Niacin Other (See Comments)    Drops BP and severe  . Statins Other (See Comments)    Severe joint pain and drops BP.    Outpatient Meds: Current Outpatient Prescriptions  Medication Sig Dispense Refill  . amLODipine (NORVASC) 5 MG tablet TAKE 1 TABLET BY MOUTH EVERY DAY 90 tablet 3  . aspirin 81 MG tablet Take 81 mg by mouth daily.      . fenofibrate (TRICOR) 145 MG tablet TAKE 1 TABLET BY MOUTH EVERY DAY 90 tablet 3  . Multiple Vitamins-Minerals (MENS MULTIVITAMIN PLUS PO) Take 1 tablet by mouth 2 (two) times daily.    . pantoprazole (PROTONIX) 40 MG tablet TAKE 1 TABLET BY MOUTH EVERY DAY AT 12  NOON 30 tablet 6  . ramipril (ALTACE) 2.5 MG capsule Take 1 capsule by mouth every day (Patient taking differently: Take 1 capsule by mouth every day in evening) 90 capsule 3  . zolpidem (AMBIEN) 10 MG tablet Take 10 mg by mouth at bedtime.    . hyoscyamine (LEVSIN, ANASPAZ) 0.125 MG tablet Take 1 tablet (0.125 mg total) by mouth every 6 (six) hours as needed. 2 tablet 1   No current facility-administered medications for this visit.      ___________________________________________________________________ Objective  Exam:  BP 112/80 mmHg  Pulse 100  Ht 6' (1.829 m)  Wt 105.461 kg (232 lb 8 oz)  BMI 31.53 kg/m2   Gen: this is a healthy-appearing middle aged male patient   HEENT: sclera anicteric, oral mucosa moist without lesions  Neck: supple, no JVD or lymphadenopathy  Cardiac: RRR without murmur, S1/S2, no peripheral edema  Pulm: clear to auscultation bilaterally, normal RR and effort noted  Abdomen: soft, no tenderness, with active bowel sounds. No guarding or palpable organomegaly noted  Skin; warm and dry, no rash or jaundice noted  Neuro: awake, alert and oriented x 3. Normal gross motor function and fluent speech.  Labs:  Reports outside stool studies ( he will get them for [place])  Lab Results  Component Value Date   WBC 4.0 11/20/2014   HGB 13.9 11/20/2014   HCT 41.9 11/20/2014   MCV 93.3 11/20/2014   PLT 234 11/20/2014   CMP     Component Value Date/Time   NA 139 11/20/2014 0800   K 4.3 11/20/2014 0800   CL 106 11/20/2014 0800   CO2 27 11/20/2014 0800   GLUCOSE 99 11/20/2014 0800   BUN 14 11/20/2014 0800   CREATININE 1.14 11/20/2014 0800   CREATININE 1.15 08/30/2014 1548   CALCIUM 9.0 11/20/2014 0800   PROT 6.6 08/30/2014 1548   ALBUMIN 4.3 08/30/2014 1548   AST 24 08/30/2014 1548   ALT 27 08/30/2014 1548   ALKPHOS 36* 08/30/2014 1548   BILITOT 0.7 08/30/2014 1548   GFRNONAA >60 11/20/2014 0800   GFRAA >60 11/20/2014 0800      Radiologic  Studies:    Assessment: Encounter Diagnoses  Name Primary?  . Chronic diarrhea Yes  . Periumbilical abdominal pain    Doubt infection ( but will review studies when available) Suspect either microscopic colitis or post-infectious IBS He is also overdue for screening colon  Plan: Colon with biopsies Trial of levsin  Thank you for the courtesy of this consult.  Please call me with any questions or concerns.  Charlie Pitter III

## 2015-02-11 NOTE — Patient Instructions (Signed)
You have been scheduled for a colonoscopy. Please follow written instructions given to you at your visit today.  Please pick up your prep supplies at the pharmacy within the next 1-3 days. If you use inhalers (even only as needed), please bring them with you on the day of your procedure. Your physician has requested that you go to www.startemmi.com and enter the access code given to you at your visit today. This web site gives a general overview about your procedure. However, you should still follow specific instructions given to you by our office regarding your preparation for the procedure.  PLEASE hold Aspirin 5 days prior to colonoscopy.

## 2015-02-15 ENCOUNTER — Ambulatory Visit (AMBULATORY_SURGERY_CENTER): Payer: BLUE CROSS/BLUE SHIELD | Admitting: Gastroenterology

## 2015-02-15 ENCOUNTER — Encounter: Payer: Self-pay | Admitting: Gastroenterology

## 2015-02-15 VITALS — BP 158/86 | HR 68 | Temp 97.0°F | Resp 16 | Ht 72.0 in | Wt 232.0 lb

## 2015-02-15 DIAGNOSIS — K529 Noninfective gastroenteritis and colitis, unspecified: Secondary | ICD-10-CM

## 2015-02-15 DIAGNOSIS — Z1211 Encounter for screening for malignant neoplasm of colon: Secondary | ICD-10-CM

## 2015-02-15 MED ORDER — SODIUM CHLORIDE 0.9 % IV SOLN: 500.0000 mL | INTRAVENOUS | Status: DC

## 2015-02-15 MED ORDER — HYOSCYAMINE SULFATE 0.125 MG PO TABS: 0.1250 mg | ORAL_TABLET | Freq: Four times a day (QID) | ORAL | Status: DC | PRN

## 2015-02-15 NOTE — Patient Instructions (Addendum)
I wrote a new prescription for your hyoscyamine, dispensing 20 tablets with one refill.    Handouts ; Diverticulosis   YOU HAD AN ENDOSCOPIC PROCEDURE TODAY AT THE [facility]:   Refer to the procedure report that was given to you for any specific questions about what was found during the examination.  If the procedure report does not answer your questions, please call your gastroenterologist to clarify.  If you requested that your care partner not be given the details of your procedure findings, then the procedure report has been included in a sealed envelope for you to review at your convenience later.  YOU SHOULD EXPECT: Some feelings of bloating in the abdomen. Passage of more gas than usual.  Walking can help get rid of the air that was put into your GI tract during the procedure and reduce the bloating. If you had a lower endoscopy (such as a colonoscopy or flexible sigmoidoscopy) you may notice spotting of blood in your stool or on the toilet paper. If you underwent a bowel prep for your procedure, you may not have a normal bowel movement for a few days.  Please Note:  You might notice some irritation and congestion in your nose or some drainage.  This is from the oxygen used during your procedure.  There is no need for concern and it should clear up in a day or so.  SYMPTOMS TO REPORT IMMEDIATELY:   Following lower endoscopy (colonoscopy or flexible sigmoidoscopy):  Excessive amounts of blood in the stool  Significant tenderness or worsening of abdominal pains  Swelling of the abdomen that is new, acute  Fever of 100F or higher  For urgent or emergent issues, a gastroenterologist can be reached at any hour by calling [phone].   DIET: Your first meal following the procedure should be a small meal and then it is ok to progress to your normal diet. Heavy or fried foods are harder to digest and may make you feel nauseous or bloated.  Likewise, meals heavy in dairy and  vegetables can increase bloating.  Drink plenty of fluids but you should avoid alcoholic beverages for 24 hours.  ACTIVITY:  You should plan to take it easy for the rest of today and you should NOT DRIVE or use heavy machinery until tomorrow (because of the sedation medicines used during the test).    FOLLOW UP: Our staff will call the number listed on your records the next business day following your procedure to check on you and address any questions or concerns that you may have regarding the information given to you following your procedure. If we do not reach you, we will leave a message.  However, if you are feeling well and you are not experiencing any problems, there is no need to return our call.  We will assume that you have returned to your regular daily activities without incident.  If any biopsies were taken you will be contacted by phone or by letter within the next 1-3 weeks.  Please call us at [phone] if you have not heard about the biopsies in 3 weeks.    SIGNATURES/CONFIDENTIALITY: You and/or your care partner have signed paperwork which will be entered into your electronic medical record.  These signatures attest to the fact that that the information above on your After Visit Summary has been reviewed and is understood.  Full responsibility of the confidentiality of this discharge information lies with you and/or your care-partner.

## 2015-02-15 NOTE — Progress Notes (Signed)
To recovery, report to Tyrell, RN, VSS. 

## 2015-02-15 NOTE — Op Note (Signed)
[facility] [address] [place], [address]   COLONOSCOPY PROCEDURE REPORT  PATIENT: [name], [name]  MR#: [id number] BIRTHDATE: 1955/06/23 , 59  yrs. old GENDER: male ENDOSCOPIST: Amada Jupiter, MD REFERRED ZJ:IRCVE Swaziland, M.D. PROCEDURE DATE:  02/15/2015 PROCEDURE:   Colonoscopy, diagnostic and Colonoscopy with biopsy [ ASA CLASS:   Class II INDICATIONS:chronic diarrhea and Screening for colonic neoplasia. MEDICATIONS: Monitored anesthesia care and Propofol 250 mg IV  DESCRIPTION OF PROCEDURE:   After the risks benefits and alternatives of the procedure were thoroughly explained, informed consent was obtained.  The digital rectal exam revealed no abnormalities of the rectum.   The [id number]  endoscope was introduced through the anus and advanced to the ileum. No adverse events experienced.   The quality of the prep was excellent.  (MiraLax was used)  The instrument was then slowly withdrawn as the colon was fully examined. Estimated blood loss is zero unless otherwise noted in this procedure report.      COLON FINDINGS: There were scattered medium-sized, nonbleeding, diverticuli from the cecum to the distal sigmoid colon. no mucosal abnormalities were seen No polyps were seen 4 biopsies were taken from the right colon, and 4 biopsies taken from the left colon to rule out microscopic colitis.  Retroflexed views revealed no abnormalities. The time to cecum = 2.6 Withdrawal time = 9.1   The scope was withdrawn and the procedure completed. COMPLICATIONS: There were no immediate complications.  ENDOSCOPIC IMPRESSION: There were scattered medium-sized, nonbleeding, diverticuli from the cecum to the distal sigmoid colon. no mucosal abnormalities were seen No polyps were seen 4 biopsies were taken from the right colon, and 4 biopsies taken from the left colon to rule out microscopic colitis  RECOMMENDATIONS: If biopsies are negative for  microscopic colitis, continue therapy with Levsin for suspected postinfectious IBS. Recall colonoscopy in 10 years for screening.  eSigned:  Amada Jupiter, MD 02/15/2015 11:52 AM   cc:   PATIENT NAME:  [name], [name] MR#: [id number]

## 2015-02-15 NOTE — Progress Notes (Signed)
Called to room to assist during endoscopic procedure.  Patient ID and intended procedure confirmed with present staff. Received instructions for my participation in the procedure from the performing physician.  

## 2015-02-18 ENCOUNTER — Telehealth: Payer: Self-pay

## 2015-02-18 NOTE — Telephone Encounter (Signed)
  Follow up Call-  Call back number 02/15/2015  Post procedure Call Back phone  # [phone]  Permission to leave phone message Yes     Patient questions:  Do you have a fever, pain , or abdominal swelling? No. Pain Score  0 *  Have you tolerated food without any problems? Yes.    Have you been able to return to your normal activities? Yes.    Do you have any questions about your discharge instructions: Diet   No. Medications  No. Follow up visit  No.  Do you have questions or concerns about your Care? No.  Actions: * If pain score is 4 or above: No action needed, pain <4.

## 2015-02-20 NOTE — Telephone Encounter (Signed)
Left msg for pt to return call. RE: Hyoscyamine 0.125mg , prior auth request from pharmacy was faxed to [place]. Does he want to use?

## 2015-02-25 ENCOUNTER — Telehealth: Payer: Self-pay | Admitting: Gastroenterology

## 2015-02-25 NOTE — Telephone Encounter (Signed)
Pt is taking the levsin every 6 hours and has not noticed any relief, he is scheduled to follow up on 04/02/15 with Dr Myrtie Neither.  Please advise what else he can try prior to appt.

## 2015-02-26 NOTE — Telephone Encounter (Signed)
Patient contacted notified and aware of current recommendations from Dr Myrtie Neither. He states he ran out of the Hyoscyamine a few days ago, pt increased his dose of Hyoscyamine on his own, he was taking 3 times a day with no improvement. The cost of the Hyoscyamine was 42 dollars for 20 pills. His insurance company did decline the prior [place]. He agrees to try over the counter Imodium. He will call if he sees no improvement. [name] will try and get a copy of his stool studies for our review.

## 2015-02-26 NOTE — Telephone Encounter (Signed)
I'm sorry to hear that he is still not feeling well.  Since he already has the hyoscyamine, please have him try 2 tabs twice a day for several days to see if improvement. If not, then change to an imodium tablet every Am and late afternoon.  I do not think I ever got the results of the stool studies from his PCP.   If the stool is loos, please make arrangements for Ova/parasite and C diff testing

## 2015-02-28 ENCOUNTER — Other Ambulatory Visit: Payer: Self-pay | Admitting: Gastroenterology

## 2015-04-02 ENCOUNTER — Ambulatory Visit (INDEPENDENT_AMBULATORY_CARE_PROVIDER_SITE_OTHER): Payer: BLUE CROSS/BLUE SHIELD | Admitting: Gastroenterology

## 2015-04-02 ENCOUNTER — Encounter: Payer: Self-pay | Admitting: Gastroenterology

## 2015-04-02 VITALS — BP 134/80 | HR 88 | Ht 72.0 in | Wt 232.0 lb

## 2015-04-02 DIAGNOSIS — K529 Noninfective gastroenteritis and colitis, unspecified: Secondary | ICD-10-CM | POA: Diagnosis not present

## 2015-04-02 DIAGNOSIS — R1033 Periumbilical pain: Secondary | ICD-10-CM | POA: Diagnosis not present

## 2015-04-02 MED ORDER — VSL#3 PO CAPS: 1.0000 | ORAL_CAPSULE | Freq: Every day | ORAL | Status: DC

## 2015-04-02 NOTE — Patient Instructions (Addendum)
Your physician has requested that you go to the basement for the lab work before leaving today.  VSL #3 probiotic one by mouth at bedtime.  Thank you for choosing [facility]  Dr Amada Jupiter III

## 2015-04-02 NOTE — Progress Notes (Signed)
[facility] Progress Note   Chief Complaint: diarrhea and abdominal pain  Subjective History:  He continues to have periumbilical abd pain and 7-9 BM's day a few days a week.  No bleeding, anorexia or weight loss. Stool studies never arrived from his PCP office.  We called them today, and after 15 minutes they eventually hung up on my MA.  ROS: Cardiovascular:  no chest pain Respiratory: no dyspnea  The patient's Past Medical, Family and Social History were reviewed and are on file in the EMR.  Objective:  Med list reviewed  Vital signs in last 24 hrs: Filed Vitals:   04/02/15 0828  BP: 134/80  Pulse: 88    Physical Exam   HEENT: sclera anicteric, oral mucosa moist without lesions  Neck: supple, no thyromegaly, JVD or lymphadenopathy  Cardiac: RRR without murmurs, S1S2 heard, no peripheral edema  Pulm: clear to auscultation bilaterally, normal RR and effort noted  Abdomen: soft, mild periumbilical  Tenderness to dee palpation, with active bowel sounds. No guarding or palpable hepatosplenomegaly.  Skin; warm and dry, no jaundice or rash   @ASSESSMENTPLANBEGIN @ Assessment: Encounter Diagnoses  Name Primary?  . Chronic diarrhea Yes  . 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   Plan: Stool c diff and O/P Gave samples of VSL#3 to take one QHS If studies neg, and no better with probiotic, get CT abd to rule out more proximal SB Crohn's (not seen in TI on colonoscopy)   [name]

## 2015-05-07 ENCOUNTER — Other Ambulatory Visit: Payer: Self-pay | Admitting: Physician Assistant

## 2015-05-07 NOTE — Telephone Encounter (Signed)
REFILL 

## 2015-05-07 NOTE — Telephone Encounter (Signed)
Review for refill, Thank you. 

## 2015-10-30 DIAGNOSIS — Z23 Encounter for immunization: Secondary | ICD-10-CM | POA: Diagnosis not present

## 2015-11-04 ENCOUNTER — Other Ambulatory Visit: Payer: Self-pay | Admitting: Cardiology

## 2015-11-07 ENCOUNTER — Telehealth: Payer: Self-pay

## 2015-11-07 NOTE — Telephone Encounter (Signed)
Called patient no answer.Left message on personal voice mail received a fax from [facility] pharmacy you received flu shot,after reviewing your chart you are past due to see Dr.Jordan.Advised to call office to schedule appointment.

## 2015-12-06 ENCOUNTER — Other Ambulatory Visit: Payer: Self-pay | Admitting: Cardiology

## 2015-12-11 ENCOUNTER — Other Ambulatory Visit: Payer: Self-pay | Admitting: Cardiology

## 2015-12-11 NOTE — Telephone Encounter (Signed)
Rx(s) sent to pharmacy electronically.  

## 2015-12-12 ENCOUNTER — Other Ambulatory Visit: Payer: Self-pay | Admitting: Cardiology

## 2015-12-13 NOTE — Telephone Encounter (Signed)
Rx(s) sent to pharmacy electronically.  

## 2016-01-02 ENCOUNTER — Other Ambulatory Visit: Payer: Self-pay | Admitting: Cardiology

## 2016-01-02 NOTE — Telephone Encounter (Signed)
REFILL 

## 2016-01-09 ENCOUNTER — Ambulatory Visit: Payer: BLUE CROSS/BLUE SHIELD | Admitting: Cardiology

## 2016-02-05 NOTE — Progress Notes (Signed)
[name] Date of Birth: [birth year]   History of Present Illness: [name] is seen for follow up CAD. He has a history of coronary disease and is status post stenting of the right coronary in 2009. He underwent cardiac catheterization in October 2012 which showed continued patency of the stents and nonobstructive coronary disease. Myoview at that time showed an inferior infarct with some peri-infarct ischemia.  In February 2013 year he underwent cervical neck surgery. He had lumbar fusion in 12/13. He is still limited in his exercise by his hip pain. He may need surgery but has put this off.  He does note burning substernal chest pain when he is out in the cold. This resolves when he gets in a warmer environment. Lasts a few minutes.  Current Outpatient Prescriptions on File Prior to Visit  Medication Sig Dispense Refill  . amLODipine (NORVASC) 5 MG tablet Take 1 tablet (5 mg total) by mouth daily. KEEP OV. 90 tablet 0  . aspirin 81 MG tablet Take 81 mg by mouth daily.      . fenofibrate (TRICOR) 145 MG tablet Take 1 tablet (145 mg total) by mouth daily. KEEP OV. 30 tablet 0  . Multiple Vitamins-Minerals (MENS MULTIVITAMIN PLUS PO) Take 1 tablet by mouth 2 (two) times daily.    . Probiotic Product (VSL#3) CAPS Take 1 capsule by mouth at bedtime. 16 capsule 0  . ramipril (ALTACE) 2.5 MG capsule Take 1 capsule (2.5 mg total) by mouth daily. KEEP OV. 90 capsule 0  . zolpidem (AMBIEN) 10 MG tablet Take 10 mg by mouth at bedtime.     No current facility-administered medications on file prior to visit.     Allergies  Allergen Reactions  . Ezetimibe Other (See Comments)    Joint pain and drops BP  . Niacin Other (See Comments)    Drops BP and severe  . Statins Other (See Comments)    Severe joint pain and drops BP.    Past Medical History:  Diagnosis Date  . Arthritis   . CHF (congestive heart failure) (HCC)   . Coronary artery disease   . Gout   . Hypercholesterolemia   .  Hypertension   . Kidney stone    approx 5 years ago  . LBBB (left bundle branch block)   . MI (myocardial infarction) 1993   LATERAL  . Pneumonia   . S/P coronary artery stent placement    RCA  . Varicose veins     Past Surgical History:  Procedure Laterality Date  . ANTERIOR CERVICAL DECOMP/DISCECTOMY FUSION  03/03/2011   Procedure: ANTERIOR CERVICAL DECOMPRESSION/DISCECTOMY FUSION 3 LEVELS;  Surgeon: Karn Cassis, MD;  Location: [facility];  Service: Neurosurgery;  Laterality: N/A;  Cervical three-four Cervical four-five Cervical five-six Anterior cervical decompression/diskectomy, fusion  . CARDIAC CATHETERIZATION  2009   Stents in RCA patent. 70 to 80% PL, and 50% ostial PD.   Marland Kitchen CARDIAC CATHETERIZATION N/A 11/20/2014   Procedure: Left Heart Cath and Coronary Angiography;  Surgeon: Sophia Cubero M Swaziland, MD;  Location: [facility] INVASIVE CV LAB;  Service: Cardiovascular;  Laterality: N/A;  . CORONARY ANGIOPLASTY     DIRECT ANGIOPLASTY THE MARGINAL BRANCH  . CORONARY STENT PLACEMENT     RCA  . LUMBAR DISC SURGERY      History  Smoking Status  . Former Smoker  . Packs/day: 2.00  . Years: 20.00  . Types: Cigarettes  . Quit date: 05/07/1999  Smokeless Tobacco  . Never  Used    History  Alcohol Use  . 3.6 oz/week  . 6 Cans of beer per week    Comment: occasional    Family History  Problem Relation Age of Onset  . Heart attack Father   . Diabetes Father   . Breast cancer Mother   . Coronary artery disease Mother   . Heart disease Brother   . Heart attack Maternal Grandfather     Review of Systems: The review of systems is per the HPI.  All other systems were reviewed and are negative.  Physical Exam: BP 128/72 (BP Location: Right Arm, Patient Position: Sitting, Cuff Size: Normal)   Pulse 79   Ht  (1.88 m)   Wt 228 lb 3.2 oz (103.5 kg)   BMI 29.30 kg/m  Patient is pleasant and in no acute distress. Skin is warm and dry. Color is normal.  HEENT is unremarkable.  Normocephalic/atraumatic. PERRL. Sclera are nonicteric. Neck is supple. No masses. No JVD. Lungs are clear. Cardiac exam shows a regular rate and rhythm. No chest wall pain. Abdomen is soft. Extremities are without edema. Gait and ROM are intact. His left first MTP joint is swollen, red, and painful. No gross neurologic deficits noted.   LABORATORY DATA:  Lab Results  Component Value Date   WBC 4.0 11/20/2014   HGB 13.9 11/20/2014   HCT 41.9 11/20/2014   PLT 234 11/20/2014   GLUCOSE 99 11/20/2014   CHOL 164 11/20/2014   TRIG 69 11/20/2014   HDL 39 (L) 11/20/2014   LDLCALC 111 (H) 11/20/2014   ALT 27 08/30/2014   AST 24 08/30/2014   NA 139 11/20/2014   K 4.3 11/20/2014   CL 106 11/20/2014   CREATININE 1.14 11/20/2014   BUN 14 11/20/2014   CO2 27 11/20/2014   TSH 2.062 11/19/2014   INR 1.15 11/20/2014   HGBA1C 6.3 (H) 11/19/2014    Ecg today shows  NSR, LBBB. No change. I have personally reviewed and interpreted this study.  Echo 11/20/14: Study Conclusions  - Left ventricle: The cavity size was normal. Wall thickness was   increased in a pattern of mild LVH. Systolic function was normal.   The estimated ejection fraction was in the range of 50% to 55%.   Hypokinesis of the inferoseptal myocardium. Doppler parameters   are consistent with abnormal left ventricular relaxation (grade 1   diastolic dysfunction). - Ventricular septum: Septal motion showed abnormal function and   dyssynergy. - Aortic valve: There was mild regurgitation. - Left atrium: The atrium was moderately dilated. - Right ventricle: The cavity size was normal. Wall thickness was   normal. Systolic function was normal. - Inferior vena cava: The vessel was normal in size. The   respirophasic diameter changes were in the normal range (>= 50%),   consistent with normal central venous pressure.   Assessment / Plan: 1. Coronary disease status post stenting of the right coronary 2009. Cardiac  catheterization October 2012 showed nonobstructive disease. He is having burning chest pain in response to cold. We will arrange for a Lexiscan myoview. If stable follow up in one year.  2. Hypercholesterolemia. Patient is intolerant to multiple medications. He is on fenofibrate.  Encourage weight loss and heart healthy diet. Will repeat fasting lab work. May be a candidate for PCSK9 inhibitor or one of our lipid clinical trials.   3. Hypertension, controlled.  4. LBBB chronic

## 2016-02-06 ENCOUNTER — Ambulatory Visit (INDEPENDENT_AMBULATORY_CARE_PROVIDER_SITE_OTHER): Payer: BLUE CROSS/BLUE SHIELD | Admitting: Cardiology

## 2016-02-06 ENCOUNTER — Encounter: Payer: Self-pay | Admitting: Cardiology

## 2016-02-06 VITALS — BP 128/72 | HR 79 | Ht 74.0 in | Wt 228.2 lb

## 2016-02-06 DIAGNOSIS — I2510 Atherosclerotic heart disease of native coronary artery without angina pectoris: Secondary | ICD-10-CM | POA: Diagnosis not present

## 2016-02-06 DIAGNOSIS — I10 Essential (primary) hypertension: Secondary | ICD-10-CM | POA: Diagnosis not present

## 2016-02-06 DIAGNOSIS — I447 Left bundle-branch block, unspecified: Secondary | ICD-10-CM | POA: Diagnosis not present

## 2016-02-06 DIAGNOSIS — E7800 Pure hypercholesterolemia, unspecified: Secondary | ICD-10-CM | POA: Diagnosis not present

## 2016-02-06 DIAGNOSIS — R079 Chest pain, unspecified: Secondary | ICD-10-CM

## 2016-02-06 NOTE — Patient Instructions (Signed)
Continue your current therapy  We will schedule you for a nuclear stress test and fasting lab work

## 2016-02-07 ENCOUNTER — Other Ambulatory Visit: Payer: Self-pay | Admitting: Cardiology

## 2016-02-12 ENCOUNTER — Telehealth (HOSPITAL_COMMUNITY): Payer: Self-pay

## 2016-02-12 NOTE — Telephone Encounter (Signed)
Encounter complete. 

## 2016-02-13 ENCOUNTER — Telehealth (HOSPITAL_COMMUNITY): Payer: Self-pay

## 2016-02-13 NOTE — Telephone Encounter (Signed)
Encounter complete. 

## 2016-02-14 ENCOUNTER — Ambulatory Visit (HOSPITAL_COMMUNITY)
Admission: RE | Admit: 2016-02-14 | Discharge: 2016-02-14 | Disposition: A | Discharge status: Home / Self Care | Payer: BLUE CROSS/BLUE SHIELD | Source: Ambulatory Visit | Attending: Cardiovascular Disease | Admitting: Cardiovascular Disease

## 2016-02-14 DIAGNOSIS — I252 Old myocardial infarction: Secondary | ICD-10-CM | POA: Diagnosis not present

## 2016-02-14 DIAGNOSIS — I447 Left bundle-branch block, unspecified: Secondary | ICD-10-CM

## 2016-02-14 DIAGNOSIS — I501 Left ventricular failure: Secondary | ICD-10-CM | POA: Diagnosis not present

## 2016-02-14 DIAGNOSIS — I259 Chronic ischemic heart disease, unspecified: Secondary | ICD-10-CM | POA: Diagnosis not present

## 2016-02-14 DIAGNOSIS — E7800 Pure hypercholesterolemia, unspecified: Secondary | ICD-10-CM | POA: Diagnosis not present

## 2016-02-14 DIAGNOSIS — R9439 Abnormal result of other cardiovascular function study: Secondary | ICD-10-CM | POA: Diagnosis not present

## 2016-02-14 DIAGNOSIS — I2510 Atherosclerotic heart disease of native coronary artery without angina pectoris: Secondary | ICD-10-CM

## 2016-02-14 DIAGNOSIS — I10 Essential (primary) hypertension: Secondary | ICD-10-CM

## 2016-02-14 DIAGNOSIS — R079 Chest pain, unspecified: Secondary | ICD-10-CM

## 2016-02-14 LAB — LIPID PANEL
Cholesterol: 168 mg/dL (ref <200)
HDL: 46 mg/dL (ref >40)
LDL Cholesterol: 107 mg/dL — ABNORMAL HIGH (ref <100)
Total CHOL/HDL Ratio: 3.7 Ratio (ref <5.0)
Triglycerides: 76 mg/dL (ref <150)
VLDL: 15 mg/dL (ref <30)

## 2016-02-14 LAB — HEPATIC FUNCTION PANEL
ALT: 25 U/L (ref 9–46)
AST: 24 U/L (ref 10–35)
Albumin: 4.2 g/dL (ref 3.6–5.1)
Alkaline Phosphatase: 34 U/L — ABNORMAL LOW (ref 40–115)
Bilirubin, Direct: 0.2 mg/dL (ref <=0.2)
Indirect Bilirubin: 0.7 mg/dL (ref 0.2–1.2)
Total Bilirubin: 0.9 mg/dL (ref 0.2–1.2)
Total Protein: 6.8 g/dL (ref 6.1–8.1)

## 2016-02-14 LAB — MYOCARDIAL PERFUSION IMAGING
LV dias vol: 249 mL (ref 62–150)
LV sys vol: 154 mL
Peak HR: 90 {beats}/min
Rest HR: 63 {beats}/min
SDS: 5
SRS: 11
SSS: 15
TID: 1.25

## 2016-02-14 LAB — BASIC METABOLIC PANEL
BUN: 18 mg/dL (ref 7–25)
CO2: 27 mmol/L (ref 20–31)
Calcium: 9.9 mg/dL (ref 8.6–10.3)
Chloride: 105 mmol/L (ref 98–110)
Creat: 1.13 mg/dL (ref 0.70–1.25)
Glucose, Bld: 95 mg/dL (ref 65–99)
Potassium: 4.6 mmol/L (ref 3.5–5.3)
Sodium: 139 mmol/L (ref 135–146)

## 2016-02-14 MED ORDER — REGADENOSON 0.4 MG/5ML IV SOLN
0.4000 mg | Freq: Once | INTRAVENOUS | Status: AC
  Administered 2016-02-14: 0.4 mg via INTRAVENOUS

## 2016-02-14 MED ORDER — TECHNETIUM TC 99M TETROFOSMIN IV KIT
10.3000 | PACK | Freq: Once | INTRAVENOUS | Status: AC | PRN
  Administered 2016-02-14: 10.3 via INTRAVENOUS
  Filled 2016-02-14: qty 11

## 2016-02-14 MED ORDER — TECHNETIUM TC 99M TETROFOSMIN IV KIT
31.6000 | PACK | Freq: Once | INTRAVENOUS | Status: AC | PRN
  Administered 2016-02-14: 31.6 via INTRAVENOUS
  Filled 2016-02-14: qty 32

## 2016-02-17 ENCOUNTER — Other Ambulatory Visit: Payer: Self-pay

## 2016-02-17 ENCOUNTER — Telehealth: Payer: Self-pay | Admitting: Cardiology

## 2016-02-17 MED ORDER — RAMIPRIL 2.5 MG PO CAPS: 2.5000 mg | ORAL_CAPSULE | Freq: Every day | ORAL | 3 refills | Status: DC

## 2016-02-17 MED ORDER — AMLODIPINE BESYLATE 5 MG PO TABS: 5.0000 mg | ORAL_TABLET | Freq: Every day | ORAL | 3 refills | Status: DC

## 2016-02-17 MED ORDER — FENOFIBRATE 145 MG PO TABS: 145.0000 mg | ORAL_TABLET | Freq: Every day | ORAL | 3 refills | Status: DC

## 2016-02-17 NOTE — Telephone Encounter (Signed)
Returning your call from Friday. °

## 2016-02-17 NOTE — Telephone Encounter (Signed)
Returned call to patient lab results and myoview results given.

## 2016-02-27 DIAGNOSIS — S61012A Laceration without foreign body of left thumb without damage to nail, initial encounter: Secondary | ICD-10-CM | POA: Diagnosis not present

## 2016-03-02 ENCOUNTER — Other Ambulatory Visit: Payer: Self-pay | Admitting: Cardiology

## 2016-03-06 ENCOUNTER — Encounter: Payer: Self-pay | Admitting: Pharmacist Clinician (PhC)/ Clinical Pharmacy Specialist

## 2016-03-06 ENCOUNTER — Other Ambulatory Visit: Payer: Self-pay | Admitting: Cardiology

## 2016-03-06 ENCOUNTER — Ambulatory Visit (INDEPENDENT_AMBULATORY_CARE_PROVIDER_SITE_OTHER): Payer: BLUE CROSS/BLUE SHIELD | Admitting: Pharmacist Clinician (PhC)/ Clinical Pharmacy Specialist

## 2016-03-06 DIAGNOSIS — E7800 Pure hypercholesterolemia, unspecified: Secondary | ICD-10-CM

## 2016-03-06 NOTE — Assessment & Plan Note (Signed)
Patient with early onset ASCVD, first stents when he was just 36.  Unable to tolerate multiple statin drugs or ezetimibe.  Currently on fenofibrate, but LDL still not near goal of < 70.  Will send for his paper chart from [facility] to get better information on statin history.  Once we receive this, will process paperwork to get him approved for Repatha.

## 2016-03-06 NOTE — Telephone Encounter (Signed)
Rx(s) sent to pharmacy electronically.  

## 2016-03-06 NOTE — Progress Notes (Signed)
03/06/2016 [name] 1955-11-14 [id number]   HPI:  [name] is a 61 y.o. male patient of Dr Swaziland, who presents today for a lipid clinic evaluation.  His cardiac history is significant for having first stents placed at the age of 69 (1994), then a stent to RCA in 2009.  A stress myoview in 2012 showed an inferior infarct.  Since then he has undergone cervical neck surgery and is limited somewhat in his ability to exercise.  He has been able to increase this some, and in addition to better eating habits has seen his weight drop from 246 to 218 pounds.    Current Medications:  Fenofibrate  Cholesterol Goals:   LDL < 70  Intolerant/previously tried:  Has tried multiple statin drugs, prescribed by Dr. Swaziland but unfortunately prior to our current EMS system, which has been in place since 2012. Will try to get his [facility] paper chart from storage for this information.  He does recall that the statins caused him to have severe myalgias, to the point where getting out of bed was difficult.  Ezetimibe also caused joint pains, as well as significant drops in his blood pressure  Family history:   Mother had MI in her 42's, lived to 37; 1 brother with CABG x 4 when he was 27.    Diet:   Mostly home, no added salt, no fried foods; tryiing to avoid white foods; fresh organic fruits and vegetables.  Has managed to lose 28 pounds with dietary improvements and increased exercise  Exercise:    cardio at gym 3-4 times per week, rides stationary bike most days, occasional weights when at gym  Labs:     02/2016:  TC 168, TG 76, HDL 46, LDL 107 (fenofibrate 145 mg)  11/2014:  TC 164, TG 69, HDL 39, LDL 111 (fenofibrate 145 mg)    Current Outpatient Prescriptions  Medication Sig Dispense Refill  . amLODipine (NORVASC) 5 MG tablet Take 1 tablet (5 mg total) by mouth daily. 90 tablet 3  . aspirin 81 MG tablet Take 81 mg by mouth daily.      . fenofibrate (TRICOR) 145 MG tablet Take 1  tablet (145 mg total) by mouth daily. 90 tablet 3  . Multiple Vitamins-Minerals (MENS MULTIVITAMIN PLUS PO) Take 1 tablet by mouth 2 (two) times daily.    . Probiotic Product (VSL#3) CAPS Take 1 capsule by mouth at bedtime. 16 capsule 0  . ramipril (ALTACE) 2.5 MG capsule Take 1 capsule (2.5 mg total) by mouth daily. 90 capsule 3  . zolpidem (AMBIEN) 10 MG tablet Take 10 mg by mouth at bedtime.     No current facility-administered medications for this visit.     Allergies  Allergen Reactions  . Ezetimibe Other (See Comments)    Joint pain and drops BP  . Niacin Other (See Comments)    Drops BP and severe  . Statins Other (See Comments)    Severe joint pain and drops BP.    Past Medical History:  Diagnosis Date  . Arthritis   . CHF (congestive heart failure) (HCC)   . Coronary artery disease   . Gout   . Hypercholesterolemia   . Hypertension   . Kidney stone    approx 5 years ago  . LBBB (left bundle branch block)   . MI (myocardial infarction) 1993   LATERAL  . Pneumonia   . S/P coronary artery stent placement    RCA  . Varicose veins  There were no vitals taken for this visit.   No problem-specific Assessment & Plan notes found for this encounter.   Phillips Hay PharmD CPP [facility]

## 2016-03-06 NOTE — Patient Instructions (Signed)
We will start the paperwork for Repatha. You will probably get a rejection letter in the mail, please let us know when this happens.    Continue with your fenofibrate.  Evolocumab injection What is this medicine? EVOLOCUMAB (e voe LOK ue mab) is known as a PCSK9 inhibitor. It is used to lower the level of cholesterol in the blood. It may be used alone or in combination with other cholesterol-lowering drugs. This drug may also be used to reduce the risk of heart attack, stroke, and certain types of heart surgery in patients with heart disease. This medicine may be used for other purposes; ask your health care provider or pharmacist if you have questions. COMMON BRAND NAME(S): REPATHA What should I tell my health care provider before I take this medicine? They need to know if you have any of these conditions: -an unusual or allergic reaction to evolocumab, other medicines, foods, dyes, or preservatives -pregnant or trying to get pregnant -breast-feeding How should I use this medicine? This medicine is for injection under the skin. You will be taught how to prepare and give this medicine. Use exactly as directed. Take your medicine at regular intervals. Do not take your medicine more often than directed. It is important that you put your used needles and syringes in a special sharps container. Do not put them in a trash can. If you do not have a sharps container, call your pharmacist or health care provider to get one. Talk to your pediatrician regarding the use of this medicine in children. While this drug may be prescribed for children as young as 13 years for selected conditions, precautions do apply. Overdosage: If you think you have taken too much of this medicine contact a poison control center or emergency room at once. NOTE: This medicine is only for you. Do not share this medicine with others. What if I miss a dose? If you miss a dose, take it as soon as you can if there are more than 7  days until the next scheduled dose, or skip the missed dose and take the next dose according to your original schedule. Do not take double or extra doses. What may interact with this medicine? Interactions are not expected. This list may not describe all possible interactions. Give your health care provider a list of all the medicines, herbs, non-prescription drugs, or dietary supplements you use. Also tell them if you smoke, drink alcohol, or use illegal drugs. Some items may interact with your medicine. What should I watch for while using this medicine? You may need blood work while you are taking this medicine. What side effects may I notice from receiving this medicine? Side effects that you should report to your doctor or health care professional as soon as possible: -allergic reactions like skin rash, itching or hives, swelling of the face, lips, or tongue -signs and symptoms of infection like fever or chills; cough; sore throat; pain or trouble passing urine Side effects that usually do not require medical attention (report to your doctor or health care professional if they continue or are bothersome): -diarrhea -nausea -muscle pain -pain, redness, or irritation at site where injected This list may not describe all possible side effects. Call your doctor for medical advice about side effects. You may report side effects to FDA at 1-800-FDA-1088. Where should I keep my medicine? Keep out of the reach of children. You will be instructed on how to store this medicine. Throw away any unused medicine after the expiration  date on the label. NOTE: This sheet is a summary. It may not cover all possible information. If you have questions about this medicine, talk to your doctor, pharmacist, or health care provider.  2018 Elsevier/Gold Standard (2015-12-09 13:21:53)

## 2016-03-27 ENCOUNTER — Telehealth: Payer: Self-pay | Admitting: Pharmacist Clinician (PhC)/ Clinical Pharmacy Specialist

## 2016-03-27 NOTE — Telephone Encounter (Signed)
Paper chart pulled from [facility].  In 2001 patient was on Lipitor 10 mg every other day, alternating with 20 mg.  He was able to tolerate this for some time before the myalgias forced him to discontinue.  The chart did not go past 2001, so I am not sure for how long he was able to take this, or any other statins he might have tried before our current charting system started.

## 2016-04-11 ENCOUNTER — Other Ambulatory Visit: Payer: Self-pay | Admitting: Physician Assistant

## 2016-04-13 NOTE — Telephone Encounter (Signed)
Refill Request.  

## 2016-04-14 ENCOUNTER — Telehealth: Payer: Self-pay | Admitting: Pharmacist Clinician (PhC)/ Clinical Pharmacy Specialist

## 2016-04-14 ENCOUNTER — Other Ambulatory Visit: Payer: Self-pay | Admitting: Physician Assistant

## 2016-04-14 ENCOUNTER — Other Ambulatory Visit: Payer: Self-pay | Admitting: Pharmacist Clinician (PhC)/ Clinical Pharmacy Specialist

## 2016-04-14 MED ORDER — EVOLOCUMAB 140 MG/ML ~~LOC~~ SOAJ: 140.0000 mg | SUBCUTANEOUS | 12 refills | Status: DC

## 2016-04-14 NOTE — Telephone Encounter (Signed)
REFILL 

## 2016-04-14 NOTE — Telephone Encounter (Signed)
Please review for refill. Thanks!  

## 2016-04-14 NOTE — Telephone Encounter (Signed)
Was able to pull [facility] Card[facility] chart and search for statin history.  Per chart history he took pravastatin 40 mg from September 2003 to September 2005 before developing myalgias.  He was later started on Crestor 10 mg, however was only able to take for about 3-4 weeks before myalgias developed.  These are in addition to the atorvastatin 10 mg qod/20 mg qod alternating dose that he took in 2001.

## 2016-05-12 DIAGNOSIS — S86911A Strain of unspecified muscle(s) and tendon(s) at lower leg level, right leg, initial encounter: Secondary | ICD-10-CM | POA: Diagnosis not present

## 2016-05-19 ENCOUNTER — Other Ambulatory Visit: Payer: Self-pay | Admitting: Pharmacist Clinician (PhC)/ Clinical Pharmacy Specialist

## 2016-05-19 DIAGNOSIS — E785 Hyperlipidemia, unspecified: Secondary | ICD-10-CM

## 2016-05-19 NOTE — Telephone Encounter (Signed)
First dose Repatha May 4.  Labs after 5-6 doses

## 2016-07-07 ENCOUNTER — Telehealth: Payer: Self-pay | Admitting: Cardiology

## 2016-07-07 DIAGNOSIS — E7800 Pure hypercholesterolemia, unspecified: Secondary | ICD-10-CM

## 2016-07-07 NOTE — Telephone Encounter (Signed)
New Message  Pt call requesting to speak with RN. Pt states he was put on a medication and was told to do lab work. Pt would like to know how long does he need to be on the medication before he needs to come and get the lab work completed. Please call back to discuss

## 2016-07-09 NOTE — Telephone Encounter (Signed)
Mr Friedlander received 1st Repatha injection on 05/08/2016 and is due (due date (~07/05/2016) for repeat lipid panel. Order in [place], we can mail him a copy or he can come to the clinic and complete the test any day next week.

## 2016-07-09 NOTE — Telephone Encounter (Signed)
Left message to call back  

## 2016-07-31 ENCOUNTER — Telehealth: Payer: Self-pay | Admitting: *Deleted

## 2016-07-31 ENCOUNTER — Encounter: Payer: Self-pay | Admitting: *Deleted

## 2016-07-31 NOTE — Telephone Encounter (Signed)
Received call from patient, aware that he is due for labs.  Aware of times and locations, aware he needs to be fasting.    Patient verbalized understanding.

## 2016-07-31 NOTE — Telephone Encounter (Signed)
Left message to call back.  Lab slips mailed to patient.

## 2016-08-07 DIAGNOSIS — E7800 Pure hypercholesterolemia, unspecified: Secondary | ICD-10-CM | POA: Diagnosis not present

## 2016-08-07 LAB — LIPID PANEL
Chol/HDL Ratio: 1.9 ratio (ref 0.0–5.0)
Cholesterol, Total: 97 mg/dL — ABNORMAL LOW (ref 100–199)
HDL: 50 mg/dL (ref >39)
LDL Calculated: 18 mg/dL (ref 0–99)
Triglycerides: 144 mg/dL (ref 0–149)
VLDL Cholesterol Cal: 29 mg/dL (ref 5–40)

## 2016-08-07 LAB — HEPATIC FUNCTION PANEL
ALT: 19 IU/L (ref 0–44)
AST: 19 IU/L (ref 0–40)
Albumin: 4.5 g/dL (ref 3.6–4.8)
Alkaline Phosphatase: 46 IU/L (ref 39–117)
Bilirubin Total: 0.6 mg/dL (ref 0.0–1.2)
Bilirubin, Direct: 0.22 mg/dL (ref 0.00–0.40)
Total Protein: 6.6 g/dL (ref 6.0–8.5)

## 2016-08-17 ENCOUNTER — Telehealth: Payer: Self-pay

## 2016-08-17 NOTE — Telephone Encounter (Signed)
Patient called 08/11/16 lab results given.He stated he has been having pain in both knees.He wanted to ask Dr.Jordan if Repatha could cause knee pain.Spoke to Dr.Jordan he advised Repatha would not cause knee pain.Advised needs to see PCP.

## 2016-09-04 ENCOUNTER — Telehealth: Payer: Self-pay | Admitting: Pharmacist

## 2016-09-04 NOTE — Telephone Encounter (Signed)
LMOM; follow up for repatha.    Still taking medication? Knee pain re-assess by PCP? Patient to call back to update pharmacist

## 2016-09-04 NOTE — Telephone Encounter (Signed)
Patient still using - Repatha and no further assessment for knee pain done yet.  Patient plan to continue therapy as prescribed and will call back if relevant changes made by PCP

## 2016-10-28 DIAGNOSIS — M9905 Segmental and somatic dysfunction of pelvic region: Secondary | ICD-10-CM | POA: Diagnosis not present

## 2016-10-28 DIAGNOSIS — M25561 Pain in right knee: Secondary | ICD-10-CM | POA: Diagnosis not present

## 2016-10-28 DIAGNOSIS — M5136 Other intervertebral disc degeneration, lumbar region: Secondary | ICD-10-CM | POA: Diagnosis not present

## 2016-10-28 DIAGNOSIS — M9901 Segmental and somatic dysfunction of cervical region: Secondary | ICD-10-CM | POA: Diagnosis not present

## 2016-10-28 DIAGNOSIS — M50323 Other cervical disc degeneration at C6-C7 level: Secondary | ICD-10-CM | POA: Diagnosis not present

## 2016-10-29 DIAGNOSIS — M9905 Segmental and somatic dysfunction of pelvic region: Secondary | ICD-10-CM | POA: Diagnosis not present

## 2016-10-29 DIAGNOSIS — M5136 Other intervertebral disc degeneration, lumbar region: Secondary | ICD-10-CM | POA: Diagnosis not present

## 2016-10-29 DIAGNOSIS — M50323 Other cervical disc degeneration at C6-C7 level: Secondary | ICD-10-CM | POA: Diagnosis not present

## 2016-10-29 DIAGNOSIS — M9901 Segmental and somatic dysfunction of cervical region: Secondary | ICD-10-CM | POA: Diagnosis not present

## 2016-11-02 DIAGNOSIS — M5136 Other intervertebral disc degeneration, lumbar region: Secondary | ICD-10-CM | POA: Diagnosis not present

## 2016-11-02 DIAGNOSIS — M9901 Segmental and somatic dysfunction of cervical region: Secondary | ICD-10-CM | POA: Diagnosis not present

## 2016-11-02 DIAGNOSIS — M50323 Other cervical disc degeneration at C6-C7 level: Secondary | ICD-10-CM | POA: Diagnosis not present

## 2016-11-02 DIAGNOSIS — M9905 Segmental and somatic dysfunction of pelvic region: Secondary | ICD-10-CM | POA: Diagnosis not present

## 2016-11-03 DIAGNOSIS — M5136 Other intervertebral disc degeneration, lumbar region: Secondary | ICD-10-CM | POA: Diagnosis not present

## 2016-11-03 DIAGNOSIS — M9901 Segmental and somatic dysfunction of cervical region: Secondary | ICD-10-CM | POA: Diagnosis not present

## 2016-11-03 DIAGNOSIS — M50323 Other cervical disc degeneration at C6-C7 level: Secondary | ICD-10-CM | POA: Diagnosis not present

## 2016-11-03 DIAGNOSIS — M9905 Segmental and somatic dysfunction of pelvic region: Secondary | ICD-10-CM | POA: Diagnosis not present

## 2016-11-04 DIAGNOSIS — M50323 Other cervical disc degeneration at C6-C7 level: Secondary | ICD-10-CM | POA: Diagnosis not present

## 2016-11-04 DIAGNOSIS — M5136 Other intervertebral disc degeneration, lumbar region: Secondary | ICD-10-CM | POA: Diagnosis not present

## 2016-11-04 DIAGNOSIS — M9905 Segmental and somatic dysfunction of pelvic region: Secondary | ICD-10-CM | POA: Diagnosis not present

## 2016-11-04 DIAGNOSIS — M9901 Segmental and somatic dysfunction of cervical region: Secondary | ICD-10-CM | POA: Diagnosis not present

## 2016-11-06 DIAGNOSIS — M9905 Segmental and somatic dysfunction of pelvic region: Secondary | ICD-10-CM | POA: Diagnosis not present

## 2016-11-06 DIAGNOSIS — M5136 Other intervertebral disc degeneration, lumbar region: Secondary | ICD-10-CM | POA: Diagnosis not present

## 2016-11-06 DIAGNOSIS — M50323 Other cervical disc degeneration at C6-C7 level: Secondary | ICD-10-CM | POA: Diagnosis not present

## 2016-11-06 DIAGNOSIS — M9901 Segmental and somatic dysfunction of cervical region: Secondary | ICD-10-CM | POA: Diagnosis not present

## 2016-11-09 DIAGNOSIS — M5136 Other intervertebral disc degeneration, lumbar region: Secondary | ICD-10-CM | POA: Diagnosis not present

## 2016-11-09 DIAGNOSIS — M50323 Other cervical disc degeneration at C6-C7 level: Secondary | ICD-10-CM | POA: Diagnosis not present

## 2016-11-09 DIAGNOSIS — M9905 Segmental and somatic dysfunction of pelvic region: Secondary | ICD-10-CM | POA: Diagnosis not present

## 2016-11-09 DIAGNOSIS — M9901 Segmental and somatic dysfunction of cervical region: Secondary | ICD-10-CM | POA: Diagnosis not present

## 2016-11-11 DIAGNOSIS — M50323 Other cervical disc degeneration at C6-C7 level: Secondary | ICD-10-CM | POA: Diagnosis not present

## 2016-11-11 DIAGNOSIS — M9901 Segmental and somatic dysfunction of cervical region: Secondary | ICD-10-CM | POA: Diagnosis not present

## 2016-11-11 DIAGNOSIS — M5136 Other intervertebral disc degeneration, lumbar region: Secondary | ICD-10-CM | POA: Diagnosis not present

## 2016-11-11 DIAGNOSIS — M9905 Segmental and somatic dysfunction of pelvic region: Secondary | ICD-10-CM | POA: Diagnosis not present

## 2016-11-13 DIAGNOSIS — M9901 Segmental and somatic dysfunction of cervical region: Secondary | ICD-10-CM | POA: Diagnosis not present

## 2016-11-13 DIAGNOSIS — M9905 Segmental and somatic dysfunction of pelvic region: Secondary | ICD-10-CM | POA: Diagnosis not present

## 2016-11-13 DIAGNOSIS — M50323 Other cervical disc degeneration at C6-C7 level: Secondary | ICD-10-CM | POA: Diagnosis not present

## 2016-11-13 DIAGNOSIS — M5136 Other intervertebral disc degeneration, lumbar region: Secondary | ICD-10-CM | POA: Diagnosis not present

## 2016-11-16 DIAGNOSIS — M50323 Other cervical disc degeneration at C6-C7 level: Secondary | ICD-10-CM | POA: Diagnosis not present

## 2016-11-16 DIAGNOSIS — M5136 Other intervertebral disc degeneration, lumbar region: Secondary | ICD-10-CM | POA: Diagnosis not present

## 2016-11-16 DIAGNOSIS — M9905 Segmental and somatic dysfunction of pelvic region: Secondary | ICD-10-CM | POA: Diagnosis not present

## 2016-11-16 DIAGNOSIS — M9901 Segmental and somatic dysfunction of cervical region: Secondary | ICD-10-CM | POA: Diagnosis not present

## 2016-11-18 DIAGNOSIS — M50323 Other cervical disc degeneration at C6-C7 level: Secondary | ICD-10-CM | POA: Diagnosis not present

## 2016-11-18 DIAGNOSIS — M9901 Segmental and somatic dysfunction of cervical region: Secondary | ICD-10-CM | POA: Diagnosis not present

## 2016-11-18 DIAGNOSIS — M9905 Segmental and somatic dysfunction of pelvic region: Secondary | ICD-10-CM | POA: Diagnosis not present

## 2016-11-18 DIAGNOSIS — M5136 Other intervertebral disc degeneration, lumbar region: Secondary | ICD-10-CM | POA: Diagnosis not present

## 2016-11-20 DIAGNOSIS — M9901 Segmental and somatic dysfunction of cervical region: Secondary | ICD-10-CM | POA: Diagnosis not present

## 2016-11-20 DIAGNOSIS — M5136 Other intervertebral disc degeneration, lumbar region: Secondary | ICD-10-CM | POA: Diagnosis not present

## 2016-11-20 DIAGNOSIS — M9905 Segmental and somatic dysfunction of pelvic region: Secondary | ICD-10-CM | POA: Diagnosis not present

## 2016-11-20 DIAGNOSIS — M50323 Other cervical disc degeneration at C6-C7 level: Secondary | ICD-10-CM | POA: Diagnosis not present

## 2016-11-23 DIAGNOSIS — R197 Diarrhea, unspecified: Secondary | ICD-10-CM | POA: Diagnosis not present

## 2016-11-23 DIAGNOSIS — I10 Essential (primary) hypertension: Secondary | ICD-10-CM | POA: Diagnosis not present

## 2016-11-23 DIAGNOSIS — Z9861 Coronary angioplasty status: Secondary | ICD-10-CM | POA: Diagnosis not present

## 2016-11-23 DIAGNOSIS — Z1389 Encounter for screening for other disorder: Secondary | ICD-10-CM | POA: Diagnosis not present

## 2016-11-23 DIAGNOSIS — M5136 Other intervertebral disc degeneration, lumbar region: Secondary | ICD-10-CM | POA: Diagnosis not present

## 2016-11-23 DIAGNOSIS — M9905 Segmental and somatic dysfunction of pelvic region: Secondary | ICD-10-CM | POA: Diagnosis not present

## 2016-11-23 DIAGNOSIS — M50323 Other cervical disc degeneration at C6-C7 level: Secondary | ICD-10-CM | POA: Diagnosis not present

## 2016-11-23 DIAGNOSIS — E7849 Other hyperlipidemia: Secondary | ICD-10-CM | POA: Diagnosis not present

## 2016-11-23 DIAGNOSIS — M9901 Segmental and somatic dysfunction of cervical region: Secondary | ICD-10-CM | POA: Diagnosis not present

## 2016-11-24 DIAGNOSIS — M9905 Segmental and somatic dysfunction of pelvic region: Secondary | ICD-10-CM | POA: Diagnosis not present

## 2016-11-24 DIAGNOSIS — M9901 Segmental and somatic dysfunction of cervical region: Secondary | ICD-10-CM | POA: Diagnosis not present

## 2016-11-24 DIAGNOSIS — M5136 Other intervertebral disc degeneration, lumbar region: Secondary | ICD-10-CM | POA: Diagnosis not present

## 2016-11-24 DIAGNOSIS — M50323 Other cervical disc degeneration at C6-C7 level: Secondary | ICD-10-CM | POA: Diagnosis not present

## 2016-11-30 ENCOUNTER — Telehealth: Payer: Self-pay | Admitting: Pharmacist

## 2016-11-30 DIAGNOSIS — M50323 Other cervical disc degeneration at C6-C7 level: Secondary | ICD-10-CM | POA: Diagnosis not present

## 2016-11-30 DIAGNOSIS — M9901 Segmental and somatic dysfunction of cervical region: Secondary | ICD-10-CM | POA: Diagnosis not present

## 2016-11-30 DIAGNOSIS — M5136 Other intervertebral disc degeneration, lumbar region: Secondary | ICD-10-CM | POA: Diagnosis not present

## 2016-11-30 DIAGNOSIS — M9905 Segmental and somatic dysfunction of pelvic region: Secondary | ICD-10-CM | POA: Diagnosis not present

## 2016-11-30 NOTE — Telephone Encounter (Signed)
Repatha prior auth renewed until 11/30/2017

## 2016-12-02 DIAGNOSIS — M5136 Other intervertebral disc degeneration, lumbar region: Secondary | ICD-10-CM | POA: Diagnosis not present

## 2016-12-02 DIAGNOSIS — M9901 Segmental and somatic dysfunction of cervical region: Secondary | ICD-10-CM | POA: Diagnosis not present

## 2016-12-02 DIAGNOSIS — M9905 Segmental and somatic dysfunction of pelvic region: Secondary | ICD-10-CM | POA: Diagnosis not present

## 2016-12-02 DIAGNOSIS — M50323 Other cervical disc degeneration at C6-C7 level: Secondary | ICD-10-CM | POA: Diagnosis not present

## 2016-12-08 DIAGNOSIS — M5136 Other intervertebral disc degeneration, lumbar region: Secondary | ICD-10-CM | POA: Diagnosis not present

## 2016-12-08 DIAGNOSIS — M9901 Segmental and somatic dysfunction of cervical region: Secondary | ICD-10-CM | POA: Diagnosis not present

## 2016-12-08 DIAGNOSIS — M9905 Segmental and somatic dysfunction of pelvic region: Secondary | ICD-10-CM | POA: Diagnosis not present

## 2016-12-08 DIAGNOSIS — M50323 Other cervical disc degeneration at C6-C7 level: Secondary | ICD-10-CM | POA: Diagnosis not present

## 2016-12-10 DIAGNOSIS — M9901 Segmental and somatic dysfunction of cervical region: Secondary | ICD-10-CM | POA: Diagnosis not present

## 2016-12-10 DIAGNOSIS — M5136 Other intervertebral disc degeneration, lumbar region: Secondary | ICD-10-CM | POA: Diagnosis not present

## 2016-12-10 DIAGNOSIS — M9905 Segmental and somatic dysfunction of pelvic region: Secondary | ICD-10-CM | POA: Diagnosis not present

## 2016-12-10 DIAGNOSIS — M50323 Other cervical disc degeneration at C6-C7 level: Secondary | ICD-10-CM | POA: Diagnosis not present

## 2016-12-24 DIAGNOSIS — M9901 Segmental and somatic dysfunction of cervical region: Secondary | ICD-10-CM | POA: Diagnosis not present

## 2016-12-24 DIAGNOSIS — M50323 Other cervical disc degeneration at C6-C7 level: Secondary | ICD-10-CM | POA: Diagnosis not present

## 2016-12-24 DIAGNOSIS — M5136 Other intervertebral disc degeneration, lumbar region: Secondary | ICD-10-CM | POA: Diagnosis not present

## 2016-12-24 DIAGNOSIS — M9905 Segmental and somatic dysfunction of pelvic region: Secondary | ICD-10-CM | POA: Diagnosis not present

## 2017-01-02 ENCOUNTER — Other Ambulatory Visit: Payer: Self-pay | Admitting: Cardiology

## 2017-01-14 ENCOUNTER — Other Ambulatory Visit: Payer: Self-pay | Admitting: Cardiology

## 2017-02-24 ENCOUNTER — Telehealth: Payer: Self-pay | Admitting: Cardiology

## 2017-02-24 NOTE — Telephone Encounter (Signed)
Returned call to patient.Stated his brother lives in [place] and was told he needed to see a Cardiologist.Stated he wanted to ask Dr.Jordan who he would recommend.Message sent to Dr.Jordan.

## 2017-02-24 NOTE — Telephone Encounter (Signed)
New message ° °Pt verbalized that he is returning call for RN °

## 2017-02-25 NOTE — Telephone Encounter (Signed)
I am not really all that familiar with the cardiology groups in [place].  [name] Gapinski Swaziland MD, [facility]

## 2017-02-25 NOTE — Telephone Encounter (Signed)
Returned call to patient left message on personal voice mail Dr.Jordan not familiar with cardiology groups in [place].

## 2017-04-01 DIAGNOSIS — M109 Gout, unspecified: Secondary | ICD-10-CM | POA: Diagnosis not present

## 2017-04-01 DIAGNOSIS — I10 Essential (primary) hypertension: Secondary | ICD-10-CM | POA: Diagnosis not present

## 2017-04-01 DIAGNOSIS — Z125 Encounter for screening for malignant neoplasm of prostate: Secondary | ICD-10-CM | POA: Diagnosis not present

## 2017-04-01 DIAGNOSIS — Z0000 Encounter for general adult medical examination without abnormal findings: Secondary | ICD-10-CM | POA: Diagnosis not present

## 2017-04-03 ENCOUNTER — Other Ambulatory Visit: Payer: Self-pay | Admitting: Cardiology

## 2017-04-06 ENCOUNTER — Other Ambulatory Visit: Payer: Self-pay | Admitting: Cardiology

## 2017-04-08 DIAGNOSIS — Z0000 Encounter for general adult medical examination without abnormal findings: Secondary | ICD-10-CM | POA: Diagnosis not present

## 2017-04-08 DIAGNOSIS — Z9861 Coronary angioplasty status: Secondary | ICD-10-CM | POA: Diagnosis not present

## 2017-04-08 DIAGNOSIS — I10 Essential (primary) hypertension: Secondary | ICD-10-CM | POA: Diagnosis not present

## 2017-04-08 DIAGNOSIS — Z1389 Encounter for screening for other disorder: Secondary | ICD-10-CM | POA: Diagnosis not present

## 2017-04-08 DIAGNOSIS — G4700 Insomnia, unspecified: Secondary | ICD-10-CM | POA: Diagnosis not present

## 2017-04-08 DIAGNOSIS — E7849 Other hyperlipidemia: Secondary | ICD-10-CM | POA: Diagnosis not present

## 2017-04-14 ENCOUNTER — Telehealth: Payer: Self-pay | Admitting: Cardiology

## 2017-04-14 NOTE — Telephone Encounter (Signed)
Close encounter 

## 2017-04-27 DIAGNOSIS — G4700 Insomnia, unspecified: Secondary | ICD-10-CM | POA: Diagnosis not present

## 2017-05-10 ENCOUNTER — Other Ambulatory Visit: Payer: Self-pay | Admitting: Cardiology

## 2017-05-10 NOTE — Telephone Encounter (Signed)
REFILL 

## 2017-05-21 ENCOUNTER — Other Ambulatory Visit: Payer: Self-pay

## 2017-05-28 ENCOUNTER — Other Ambulatory Visit: Payer: Self-pay | Admitting: Cardiology

## 2017-06-07 ENCOUNTER — Emergency Department (HOSPITAL_COMMUNITY): Payer: BLUE CROSS/BLUE SHIELD

## 2017-06-07 ENCOUNTER — Inpatient Hospital Stay (HOSPITAL_COMMUNITY)
Admission: EM | Admit: 2017-06-07 | Discharge: 2017-06-21 | DRG: 233 | Disposition: A | Discharge status: Home / Self Care | Payer: BLUE CROSS/BLUE SHIELD | Attending: Cardiothoracic Surgery | Admitting: Cardiothoracic Surgery

## 2017-06-07 ENCOUNTER — Encounter (HOSPITAL_COMMUNITY): Payer: Self-pay

## 2017-06-07 DIAGNOSIS — R338 Other retention of urine: Secondary | ICD-10-CM | POA: Diagnosis not present

## 2017-06-07 DIAGNOSIS — Z87442 Personal history of urinary calculi: Secondary | ICD-10-CM

## 2017-06-07 DIAGNOSIS — Z955 Presence of coronary angioplasty implant and graft: Secondary | ICD-10-CM | POA: Diagnosis not present

## 2017-06-07 DIAGNOSIS — I351 Nonrheumatic aortic (valve) insufficiency: Secondary | ICD-10-CM | POA: Diagnosis present

## 2017-06-07 DIAGNOSIS — M1990 Unspecified osteoarthritis, unspecified site: Secondary | ICD-10-CM | POA: Diagnosis present

## 2017-06-07 DIAGNOSIS — D689 Coagulation defect, unspecified: Secondary | ICD-10-CM | POA: Diagnosis not present

## 2017-06-07 DIAGNOSIS — I130 Hypertensive heart and chronic kidney disease with heart failure and stage 1 through stage 4 chronic kidney disease, or unspecified chronic kidney disease: Secondary | ICD-10-CM | POA: Diagnosis not present

## 2017-06-07 DIAGNOSIS — R7989 Other specified abnormal findings of blood chemistry: Secondary | ICD-10-CM

## 2017-06-07 DIAGNOSIS — R9431 Abnormal electrocardiogram [ECG] [EKG]: Secondary | ICD-10-CM | POA: Diagnosis not present

## 2017-06-07 DIAGNOSIS — I9581 Postprocedural hypotension: Secondary | ICD-10-CM | POA: Diagnosis not present

## 2017-06-07 DIAGNOSIS — I5043 Acute on chronic combined systolic (congestive) and diastolic (congestive) heart failure: Secondary | ICD-10-CM | POA: Diagnosis not present

## 2017-06-07 DIAGNOSIS — J810 Acute pulmonary edema: Secondary | ICD-10-CM

## 2017-06-07 DIAGNOSIS — R778 Other specified abnormalities of plasma proteins: Secondary | ICD-10-CM

## 2017-06-07 DIAGNOSIS — Z981 Arthrodesis status: Secondary | ICD-10-CM

## 2017-06-07 DIAGNOSIS — I5023 Acute on chronic systolic (congestive) heart failure: Secondary | ICD-10-CM | POA: Diagnosis not present

## 2017-06-07 DIAGNOSIS — M109 Gout, unspecified: Secondary | ICD-10-CM | POA: Diagnosis present

## 2017-06-07 DIAGNOSIS — Z803 Family history of malignant neoplasm of breast: Secondary | ICD-10-CM

## 2017-06-07 DIAGNOSIS — R000 Tachycardia, unspecified: Secondary | ICD-10-CM | POA: Diagnosis not present

## 2017-06-07 DIAGNOSIS — I252 Old myocardial infarction: Secondary | ICD-10-CM

## 2017-06-07 DIAGNOSIS — R51 Headache: Secondary | ICD-10-CM | POA: Diagnosis not present

## 2017-06-07 DIAGNOSIS — Z9689 Presence of other specified functional implants: Secondary | ICD-10-CM

## 2017-06-07 DIAGNOSIS — M549 Dorsalgia, unspecified: Secondary | ICD-10-CM | POA: Diagnosis present

## 2017-06-07 DIAGNOSIS — E7849 Other hyperlipidemia: Secondary | ICD-10-CM | POA: Diagnosis present

## 2017-06-07 DIAGNOSIS — D62 Acute posthemorrhagic anemia: Secondary | ICD-10-CM | POA: Diagnosis not present

## 2017-06-07 DIAGNOSIS — Z7982 Long term (current) use of aspirin: Secondary | ICD-10-CM

## 2017-06-07 DIAGNOSIS — R0602 Shortness of breath: Secondary | ICD-10-CM | POA: Diagnosis not present

## 2017-06-07 DIAGNOSIS — Z888 Allergy status to other drugs, medicaments and biological substances status: Secondary | ICD-10-CM | POA: Diagnosis not present

## 2017-06-07 DIAGNOSIS — I25110 Atherosclerotic heart disease of native coronary artery with unstable angina pectoris: Secondary | ICD-10-CM | POA: Diagnosis not present

## 2017-06-07 DIAGNOSIS — I472 Ventricular tachycardia: Secondary | ICD-10-CM | POA: Diagnosis not present

## 2017-06-07 DIAGNOSIS — T463X5A Adverse effect of coronary vasodilators, initial encounter: Secondary | ICD-10-CM | POA: Diagnosis not present

## 2017-06-07 DIAGNOSIS — I493 Ventricular premature depolarization: Secondary | ICD-10-CM | POA: Diagnosis not present

## 2017-06-07 DIAGNOSIS — I214 Non-ST elevation (NSTEMI) myocardial infarction: Principal | ICD-10-CM

## 2017-06-07 DIAGNOSIS — N182 Chronic kidney disease, stage 2 (mild): Secondary | ICD-10-CM | POA: Diagnosis present

## 2017-06-07 DIAGNOSIS — Y92230 Patient room in hospital as the place of occurrence of the external cause: Secondary | ICD-10-CM | POA: Diagnosis not present

## 2017-06-07 DIAGNOSIS — I255 Ischemic cardiomyopathy: Secondary | ICD-10-CM | POA: Diagnosis present

## 2017-06-07 DIAGNOSIS — M7989 Other specified soft tissue disorders: Secondary | ICD-10-CM | POA: Diagnosis not present

## 2017-06-07 DIAGNOSIS — I2510 Atherosclerotic heart disease of native coronary artery without angina pectoris: Secondary | ICD-10-CM | POA: Diagnosis not present

## 2017-06-07 DIAGNOSIS — R05 Cough: Secondary | ICD-10-CM | POA: Diagnosis not present

## 2017-06-07 DIAGNOSIS — Z79899 Other long term (current) drug therapy: Secondary | ICD-10-CM | POA: Diagnosis not present

## 2017-06-07 DIAGNOSIS — J90 Pleural effusion, not elsewhere classified: Secondary | ICD-10-CM | POA: Diagnosis not present

## 2017-06-07 DIAGNOSIS — Z951 Presence of aortocoronary bypass graft: Secondary | ICD-10-CM

## 2017-06-07 DIAGNOSIS — I447 Left bundle-branch block, unspecified: Secondary | ICD-10-CM | POA: Diagnosis present

## 2017-06-07 DIAGNOSIS — I160 Hypertensive urgency: Secondary | ICD-10-CM | POA: Diagnosis present

## 2017-06-07 DIAGNOSIS — R0902 Hypoxemia: Secondary | ICD-10-CM | POA: Diagnosis not present

## 2017-06-07 DIAGNOSIS — R079 Chest pain, unspecified: Secondary | ICD-10-CM | POA: Diagnosis not present

## 2017-06-07 DIAGNOSIS — N401 Enlarged prostate with lower urinary tract symptoms: Secondary | ICD-10-CM | POA: Diagnosis present

## 2017-06-07 DIAGNOSIS — Z833 Family history of diabetes mellitus: Secondary | ICD-10-CM

## 2017-06-07 DIAGNOSIS — I110 Hypertensive heart disease with heart failure: Secondary | ICD-10-CM | POA: Diagnosis not present

## 2017-06-07 DIAGNOSIS — G4700 Insomnia, unspecified: Secondary | ICD-10-CM | POA: Diagnosis present

## 2017-06-07 DIAGNOSIS — I509 Heart failure, unspecified: Secondary | ICD-10-CM | POA: Diagnosis not present

## 2017-06-07 DIAGNOSIS — J9811 Atelectasis: Secondary | ICD-10-CM | POA: Diagnosis not present

## 2017-06-07 DIAGNOSIS — R0689 Other abnormalities of breathing: Secondary | ICD-10-CM | POA: Diagnosis not present

## 2017-06-07 DIAGNOSIS — E7800 Pure hypercholesterolemia, unspecified: Secondary | ICD-10-CM | POA: Diagnosis not present

## 2017-06-07 DIAGNOSIS — R5381 Other malaise: Secondary | ICD-10-CM | POA: Diagnosis not present

## 2017-06-07 DIAGNOSIS — I249 Acute ischemic heart disease, unspecified: Secondary | ICD-10-CM | POA: Diagnosis not present

## 2017-06-07 DIAGNOSIS — I8390 Asymptomatic varicose veins of unspecified lower extremity: Secondary | ICD-10-CM | POA: Diagnosis present

## 2017-06-07 DIAGNOSIS — Z8249 Family history of ischemic heart disease and other diseases of the circulatory system: Secondary | ICD-10-CM | POA: Diagnosis not present

## 2017-06-07 DIAGNOSIS — N35812 Other urethral bulbous stricture, male: Secondary | ICD-10-CM | POA: Diagnosis not present

## 2017-06-07 DIAGNOSIS — R55 Syncope and collapse: Secondary | ICD-10-CM | POA: Diagnosis not present

## 2017-06-07 DIAGNOSIS — E785 Hyperlipidemia, unspecified: Secondary | ICD-10-CM | POA: Diagnosis present

## 2017-06-07 DIAGNOSIS — Z01810 Encounter for preprocedural cardiovascular examination: Secondary | ICD-10-CM | POA: Diagnosis not present

## 2017-06-07 DIAGNOSIS — Z87891 Personal history of nicotine dependence: Secondary | ICD-10-CM

## 2017-06-07 DIAGNOSIS — N35819 Other urethral stricture, male, unspecified site: Secondary | ICD-10-CM | POA: Diagnosis not present

## 2017-06-07 DIAGNOSIS — Z01818 Encounter for other preprocedural examination: Secondary | ICD-10-CM | POA: Diagnosis not present

## 2017-06-07 DIAGNOSIS — R0789 Other chest pain: Secondary | ICD-10-CM | POA: Diagnosis not present

## 2017-06-07 DIAGNOSIS — E1122 Type 2 diabetes mellitus with diabetic chronic kidney disease: Secondary | ICD-10-CM | POA: Diagnosis present

## 2017-06-07 DIAGNOSIS — N35112 Postinfective bulbous urethral stricture, not elsewhere classified: Secondary | ICD-10-CM | POA: Diagnosis not present

## 2017-06-07 DIAGNOSIS — G8929 Other chronic pain: Secondary | ICD-10-CM | POA: Diagnosis present

## 2017-06-07 DIAGNOSIS — Z436 Encounter for attention to other artificial openings of urinary tract: Secondary | ICD-10-CM | POA: Diagnosis not present

## 2017-06-07 DIAGNOSIS — E1169 Type 2 diabetes mellitus with other specified complication: Secondary | ICD-10-CM | POA: Diagnosis present

## 2017-06-07 DIAGNOSIS — I080 Rheumatic disorders of both mitral and aortic valves: Secondary | ICD-10-CM | POA: Diagnosis not present

## 2017-06-07 HISTORY — DX: Personal history of urinary calculi: Z87.442

## 2017-06-07 HISTORY — DX: Personal history of other diseases of the musculoskeletal system and connective tissue: Z87.39

## 2017-06-07 LAB — COMPREHENSIVE METABOLIC PANEL
ALT: 21 U/L (ref 17–63)
AST: 31 U/L (ref 15–41)
Albumin: 3.9 g/dL (ref 3.5–5.0)
Alkaline Phosphatase: 35 U/L — ABNORMAL LOW (ref 38–126)
Anion gap: 11 (ref 5–15)
BUN: 24 mg/dL — ABNORMAL HIGH (ref 6–20)
CO2: 22 mmol/L (ref 22–32)
Calcium: 9.2 mg/dL (ref 8.9–10.3)
Chloride: 107 mmol/L (ref 101–111)
Creatinine, Ser: 1.22 mg/dL (ref 0.61–1.24)
GFR calc Af Amer: >60 mL/min (ref >60)
GFR calc non Af Amer: >60 mL/min (ref >60)
Glucose, Bld: 159 mg/dL — ABNORMAL HIGH (ref 65–99)
Potassium: 3.8 mmol/L (ref 3.5–5.1)
Sodium: 140 mmol/L (ref 135–145)
Total Bilirubin: 0.7 mg/dL (ref 0.3–1.2)
Total Protein: 6.3 g/dL — ABNORMAL LOW (ref 6.5–8.1)

## 2017-06-07 LAB — CBC WITH DIFFERENTIAL/PLATELET
Abs Immature Granulocytes: 0 10*3/uL (ref 0.0–0.1)
Basophils Absolute: 0 10*3/uL (ref 0.0–0.1)
Basophils Relative: 1 %
Eosinophils Absolute: 0.1 10*3/uL (ref 0.0–0.7)
Eosinophils Relative: 2 %
HCT: 43.9 % (ref 39.0–52.0)
Hemoglobin: 14.3 g/dL (ref 13.0–17.0)
Immature Granulocytes: 0 %
Lymphocytes Relative: 26 %
Lymphs Abs: 1.2 10*3/uL (ref 0.7–4.0)
MCH: 30.5 pg (ref 26.0–34.0)
MCHC: 32.6 g/dL (ref 30.0–36.0)
MCV: 93.6 fL (ref 78.0–100.0)
Monocytes Absolute: 0.4 10*3/uL (ref 0.1–1.0)
Monocytes Relative: 9 %
Neutro Abs: 2.9 10*3/uL (ref 1.7–7.7)
Neutrophils Relative %: 62 %
Platelets: 267 10*3/uL (ref 150–400)
RBC: 4.69 MIL/uL (ref 4.22–5.81)
RDW: 13.3 % (ref 11.5–15.5)
WBC: 4.7 10*3/uL (ref 4.0–10.5)

## 2017-06-07 LAB — D-DIMER, QUANTITATIVE: D-Dimer, Quant: 1.88 ug/mL-FEU — ABNORMAL HIGH (ref 0.00–0.50)

## 2017-06-07 LAB — BRAIN NATRIURETIC PEPTIDE: B Natriuretic Peptide: 177.1 pg/mL — ABNORMAL HIGH (ref 0.0–100.0)

## 2017-06-07 LAB — TROPONIN I
Troponin I: 0.11 ng/mL (ref <0.03)
Troponin I: 0.21 ng/mL (ref <0.03)

## 2017-06-07 MED ORDER — NITROGLYCERIN 2 % TD OINT
1.0000 [in_us] | TOPICAL_OINTMENT | Freq: Once | TRANSDERMAL | Status: AC
  Administered 2017-06-07: 1 [in_us] via TOPICAL
  Filled 2017-06-07: qty 1

## 2017-06-07 MED ORDER — IOPAMIDOL (ISOVUE-370) INJECTION 76%
100.0000 mL | Freq: Once | INTRAVENOUS | Status: AC | PRN
  Administered 2017-06-07: 100 mL via INTRAVENOUS

## 2017-06-07 MED ORDER — FUROSEMIDE 10 MG/ML IJ SOLN
40.0000 mg | Freq: Once | INTRAMUSCULAR | Status: AC
  Administered 2017-06-07: 40 mg via INTRAVENOUS
  Filled 2017-06-07: qty 4

## 2017-06-07 MED ORDER — IOPAMIDOL (ISOVUE-370) INJECTION 76%
INTRAVENOUS | Status: AC
  Filled 2017-06-07: qty 100

## 2017-06-07 NOTE — ED Notes (Signed)
Phil w/ [facility] notified to cancel CODE STEMI per Dr. Eldridge Dace.

## 2017-06-07 NOTE — ED Notes (Signed)
Pt on 4 L [place]

## 2017-06-07 NOTE — ED Notes (Signed)
Patient transported to CT 

## 2017-06-07 NOTE — H&P (Addendum)
History & Physical    Patient ID: [name] MRN: [id number], DOB/AGE: 1955/09/17   Admit date: 06/07/2017   Primary Physician: Jarome Matin, MD Primary Cardiologist: [place]  Patient Profile    62 year old man with flash pulmonary edema  Past Medical History    Past Medical History:  Diagnosis Date  . Arthritis   . CHF (congestive heart failure) (HCC)   . Coronary artery disease   . Gout   . Hypercholesterolemia   . Hypertension   . Kidney stone    approx 5 years ago  . LBBB (left bundle branch block)   . MI (myocardial infarction) (HCC) 1993   LATERAL  . Pneumonia   . S/P coronary artery stent placement    RCA  . Varicose veins     Past Surgical History:  Procedure Laterality Date  . ANTERIOR CERVICAL DECOMP/DISCECTOMY FUSION  03/03/2011   Procedure: ANTERIOR CERVICAL DECOMPRESSION/DISCECTOMY FUSION 3 LEVELS;  Surgeon: Karn Cassis, MD;  Location: [facility];  Service: Neurosurgery;  Laterality: N/A;  Cervical three-four Cervical four-five Cervical five-six Anterior cervical decompression/diskectomy, fusion  . CARDIAC CATHETERIZATION  2009   Stents in RCA patent. 70 to 80% PL, and 50% ostial PD.   Marland Kitchen CARDIAC CATHETERIZATION N/A 11/20/2014   Procedure: Left Heart Cath and Coronary Angiography;  Surgeon: Peter M [place], MD;  Location: [facility] INVASIVE CV LAB;  Service: Cardiovascular;  Laterality: N/A;  . CORONARY ANGIOPLASTY     DIRECT ANGIOPLASTY THE MARGINAL BRANCH  . CORONARY STENT PLACEMENT     RCA  . LUMBAR DISC SURGERY       Allergies  Allergies  Allergen Reactions  . Ezetimibe Other (See Comments)    Joint pain and drops BP  . Niacin Other (See Comments)    Drops BP and severe  . Statins Other (See Comments)    Severe joint pain and drops BP.    History of Present Illness    62 year old man with a past medical history of CAD status post familial hyperlipidemia on Repatha.  Has a history of multiple PCI in the past with the most recent one  in 2016.  LVEF in 2016 was preserved and on a SPECT in 2018 was noted to drop to the 30s.  Today he presents from home with sudden onset of shortness of breath and coughing.  He has been coughing up phlegm that was either white or yellow-tinged.  He called EMS EMS found him to be hypertensive in the 180s to 190 systolic.  He presented to the [facility] emergency room on oxygen still coughing up phlegm.  Initially chest x-ray with interstitial pulmonary edema.  CT PE was negative.  He was given diuretics with good response and was weaned off oxygen within few hours.  Blood pressure returned to normal.  Reports continued weight gain over the last few weeks.  Reports to drink up to 10 bottles of water every day.  Watches his salt intake.  Denies chest pain.  Home Medications    Prior to Admission medications   Medication Sig Start Date End Date Taking? Authorizing Provider  REPATHA SURECLICK 140 MG/ML SOAJ INJECT 140MG  (1 PEN) UNDER THE SKIN EVERY OTHER WEEK 05/31/17  Yes [place], Peter M, MD  amLODipine (NORVASC) 5 MG tablet Take 1 tablet (5 mg total) by mouth daily. KEEP OV. 05/10/17   [place], Peter M, MD  aspirin 81 MG tablet Take 81 mg by mouth daily.      [provider]  fenofibrate (TRICOR) 145 MG tablet TAKE 1 TABLET BY MOUTH EVERY DAY 01/14/17   [place], Peter M, MD  gabapentin (NEURONTIN) 300 MG capsule Take 300 mg by mouth at bedtime. 03/15/17   [provider]  Multiple Vitamins-Minerals (MENS MULTIVITAMIN PLUS PO) Take 1 tablet by mouth 2 (two) times daily.    [provider]  pantoprazole (PROTONIX) 40 MG tablet TAKE 1 TABLET BY MOUTH EVERY DAY AT 12 NOON 04/14/16   Azalee Course, [place]  Probiotic Product (VSL#3) CAPS Take 1 capsule by mouth at bedtime. 04/02/15   Charlie Pitter III, MD  ramipril (ALTACE) 2.5 MG capsule Take 1 capsule (2.5 mg total) by mouth daily. KEEP OV. 05/10/17   [place], Peter M, MD  zolpidem (AMBIEN) 10 MG tablet Take 10 mg by mouth at bedtime.     [provider]    Family History    Family History  Problem Relation Age of Onset  . Heart attack Father   . Diabetes Father   . Breast cancer Mother   . Coronary artery disease Mother   . Heart disease Brother   . Heart attack Maternal Grandfather    indicated that his mother is deceased. He indicated that his father is deceased. He indicated that his sister is alive. He indicated that his brother is alive. He indicated that the status of his maternal grandfather is unknown.   Social History    Social History   Socioeconomic History  . Marital status: Married    Spouse name: Not on file  . Number of children: 1  . Years of education: Not on file  . Highest education level: Not on file  Occupational History  . Occupation: Truck Chief Strategy Officer  . Financial resource strain: Not on file  . Food insecurity:    Worry: Not on file    Inability: Not on file  . Transportation needs:    Medical: Not on file    Non-medical: Not on file  Tobacco Use  . Smoking status: Former Smoker    Packs/day: 2.00    Years: 20.00    Pack years: 40.00    Types: Cigarettes    Last attempt to quit: 05/07/1999    Years since quitting: 18.0  . Smokeless tobacco: Never Used  Substance and Sexual Activity  . Alcohol use: Yes    Alcohol/week: 3.6 oz    Types: 6 Cans of beer per week    Comment: occasional  . Drug use: No  . Sexual activity: Not on file  Lifestyle  . Physical activity:    Days per week: Not on file    Minutes per session: Not on file  . Stress: Not on file  Relationships  . Social connections:    Talks on phone: Not on file    Gets together: Not on file    Attends religious service: Not on file    Active member of club or organization: Not on file    Attends meetings of clubs or organizations: Not on file    Relationship status: Not on file  . Intimate partner violence:    Fear of current or ex partner: Not on file    Emotionally abused: Not on file     Physically abused: Not on file    Forced sexual activity: Not on file  Other Topics Concern  . Not on file  Social History Narrative  . Not on file     Review of Systems  General:  No chills, fever, night sweats or weight changes.  Cardiovascular:  No chest pain, dyspnea on exertion, edema, orthopnea, palpitations, paroxysmal nocturnal dyspnea. Dermatological: No rash, lesions/masses Respiratory: No cough, dyspnea Urologic: No hematuria, dysuria Abdominal:   No nausea, vomiting, diarrhea, bright red blood per rectum, melena, or hematemesis Neurologic:  No visual changes, wkns, changes in mental status. All other systems reviewed and are otherwise negative except as noted above.  Physical Exam    Blood pressure (!) 115/52, pulse 83, temperature 98.6 F (37 C), temperature source Oral, resp. rate 13, SpO2 95 %.  General: Pleasant, NAD Psych: Normal affect. Neuro: Alert and oriented X 3. Moves all extremities spontaneously. HEENT: Normal  Neck: Supple without bruits or JVD. Lungs: Bilateral crackles. Heart: RRR no s3, s4, or murmurs. Abdomen: Soft, non-tender, non-distended, BS + x 4.  Extremities: No clubbing, cyanosis or edema. DP/PT/Radials 2+ and equal bilaterally.  Labs    Troponin (Point of Care Test) No results for input(s): TROPIPOC in the last 72 hours. Recent Labs    06/07/17 1733 06/07/17 2134  TROPONINI 0.11* 0.21*   Lab Results  Component Value Date   WBC 4.7 06/07/2017   HGB 14.3 06/07/2017   HCT 43.9 06/07/2017   MCV 93.6 06/07/2017   PLT 267 06/07/2017    Recent Labs  Lab 06/07/17 1733  NA 140  K 3.8  CL 107  CO2 22  BUN 24*  CREATININE 1.22  CALCIUM 9.2  PROT 6.3*  BILITOT 0.7  ALKPHOS 35*  ALT 21  AST 31  GLUCOSE 159*   Lab Results  Component Value Date   CHOL 97 (L) 08/07/2016   HDL 50 08/07/2016   LDLCALC 18 08/07/2016   TRIG 144 08/07/2016   Lab Results  Component Value Date   DDIMER 1.88 (H) 06/07/2017     Radiology  Studies    Ct Angio Chest Pe W/cm &/or Wo Cm  Result Date: 06/07/2017 CLINICAL DATA:  Acute onset of generalized chest pain and dizziness. Productive cough. Elevated D-dimer. EXAM: CT ANGIOGRAPHY CHEST WITH CONTRAST TECHNIQUE: Multidetector CT imaging of the chest was performed using the standard protocol during bolus administration of intravenous contrast. Multiplanar CT image reconstructions and MIPs were obtained to evaluate the vascular anatomy. CONTRAST:  ISOVUE-370 IOPAMIDOL (ISOVUE-370) INJECTION 76% COMPARISON:  Chest radiograph performed earlier today at 5:31 p.m. FINDINGS: Cardiovascular:  There is no evidence of pulmonary embolus. The heart is borderline normal in size. Scattered coronary artery calcifications are seen. Mild calcification is noted at the aortic arch and proximal great vessels. Mediastinum/Nodes: No mediastinal lymphadenopathy is seen. No pericardial effusion is identified. The thyroid gland is unremarkable. No axillary lymphadenopathy is appreciated. Lungs/Pleura: Hazy bilateral airspace opacification is noted, with interstitial prominence, concerning for mild pulmonary edema. A few blebs are noted at the right lung base. No significant pleural effusion or pneumothorax is seen. Upper Abdomen: The visualized portions of the liver and spleen are unremarkable. The gallbladder is unremarkable in appearance. The visualized portions of the pancreas and right adrenal gland are within normal limits. Musculoskeletal: No acute osseous abnormalities are identified. Anterior cervical spinal fusion hardware is partially imaged. The visualized musculature is unremarkable in appearance. Review of the MIP images confirms the above findings. IMPRESSION: 1. No evidence of pulmonary embolus. 2. Hazy bilateral airspace opacification, with interstitial prominence, concerning for mild pulmonary edema. 3. Few blebs at the right lung base. 4. Scattered coronary artery calcifications seen. Electronically  Signed   By: Leotis Shames  Chang M.D.   On: 06/07/2017 22:54   Dg Chest Port 1 View  Result Date: 06/07/2017 CLINICAL DATA:  Hypoxia, shortness of Breath EXAM: PORTABLE CHEST 1 VIEW COMPARISON:  11/19/2014 FINDINGS: Mild cardiomegaly. Diffuse interstitial prominence throughout the lungs. Bibasilar atelectasis or scarring. No effusions or acute bony abnormality. IMPRESSION: Cardiomegaly. Diffuse interstitial prominence could reflect chronic interstitial changes or interstitial edema. Bibasilar atelectasis or scarring. Electronically Signed   By: Charlett Nose M.D.   On: 06/07/2017 17:47    ECG & Cardiac Imaging    Sinus tach, known left bundle  Assessment & Plan    Flash pulmonary edema: This could be in setting of worsening LVEF/heart failure.  Last LVEF is reduced compared to his baseline of 2016.  He has not been on outpatient diuretics.  At home on lisinopril.  Reports fluid/weight gain over the preceding weeks.  Whether there has been an acute primary ischemic insult remains to be determined.  He does have an end NSTEMI.  Recommendations: -Admit to cardiology -Start on IV heparin -Continuing aspirin and ramipril -He is an outpatient Repatha thus will hold off statin -Recommend left heart cath in the morning -We will give him lasix - TTE in the morning   Signed, Macario Golds, MD 06/07/2017, 11:58 PM

## 2017-06-07 NOTE — ED Triage Notes (Signed)
Pt from home with ems c.o cp and dizziness. CP started around 330pm today along with productive cough. EKG showed ST elevation, code STEMI called en route cancelled upon arrival. Pt arrived to ED a.o, denies any CP at this time. Given 324 ASA and 1 nitro en route that relieved the pain. VSS

## 2017-06-07 NOTE — ED Notes (Signed)
Patient updated on plan of care, awaiting on CT at this time.

## 2017-06-07 NOTE — ED Provider Notes (Signed)
[facility] EMERGENCY DEPARTMENT Provider Note   CSN: [id number] Arrival date & time: 06/07/17  1715   History   Chief Complaint Chief Complaint  Patient presents with  . Chest Pain    HPI [name] is a 62 y.o. male.  States sudden onset of dyspnea and productive cough with copious watery phlegm, followed by onset of sharp waxing and waning chest pain.   The history is provided by the patient.  Chest Pain   This is a new problem. The current episode started 1 to 2 hours ago. The problem has been gradually improving. The pain is present in the substernal region. The pain is mild. The quality of the pain is described as sharp. The pain does not radiate. Associated symptoms include cough, shortness of breath and sputum production. Pertinent negatives include no abdominal pain, no back pain, no fever, no lower extremity edema, no nausea, no orthopnea, no palpitations, no vomiting and no weakness. He has tried nothing for the symptoms.  Pertinent negatives for past medical history include no seizures.    Past Medical History:  Diagnosis Date  . Arthritis   . CHF (congestive heart failure) (HCC)   . Coronary artery disease   . Gout   . Hypercholesterolemia   . Hypertension   . Kidney stone    approx 5 years ago  . LBBB (left bundle branch block)   . MI (myocardial infarction) (HCC) 1993   LATERAL  . Pneumonia   . S/P coronary artery stent placement    RCA  . Varicose veins     Patient Active Problem List   Diagnosis Date Noted  . ACS (acute coronary syndrome) (HCC) 06/08/2017  . Pain in the chest   . Gout 08/30/2014  . Neck pain, chronic 12/17/2010  . Back pain, chronic 12/01/2010  . Pericarditis 10/02/2010  . CAD (coronary artery disease) 05/14/2010  . Hypertension   . Hypercholesterolemia   . LBBB (left bundle branch block)   . MI (myocardial infarction) [facility])     Past Surgical History:  Procedure Laterality Date  . ANTERIOR CERVICAL  DECOMP/DISCECTOMY FUSION  03/03/2011   Procedure: ANTERIOR CERVICAL DECOMPRESSION/DISCECTOMY FUSION 3 LEVELS;  Surgeon: Karn Cassis, MD;  Location: [facility] ORS;  Service: Neurosurgery;  Laterality: N/A;  Cervical three-four Cervical four-five Cervical five-six Anterior cervical decompression/diskectomy, fusion  . CARDIAC CATHETERIZATION  2009   Stents in RCA patent. 70 to 80% PL, and 50% ostial PD.   Marland Kitchen CARDIAC CATHETERIZATION N/A 11/20/2014   Procedure: Left Heart Cath and Coronary Angiography;  Surgeon: Peter M [place], MD;  Location: [facility] INVASIVE CV LAB;  Service: Cardiovascular;  Laterality: N/A;  . CORONARY ANGIOPLASTY     DIRECT ANGIOPLASTY THE MARGINAL BRANCH  . CORONARY STENT PLACEMENT     RCA  . LUMBAR DISC SURGERY          Home Medications    Prior to Admission medications   Medication Sig Start Date End Date Taking? Authorizing Provider  REPATHA SURECLICK 140 MG/ML SOAJ INJECT 140MG  (1 PEN) UNDER THE SKIN EVERY OTHER WEEK 05/31/17  Yes [place], Peter M, MD  amLODipine (NORVASC) 5 MG tablet Take 1 tablet (5 mg total) by mouth daily. KEEP OV. 05/10/17   [place], Peter M, MD  aspirin 81 MG tablet Take 81 mg by mouth daily.      [provider]  fenofibrate (TRICOR) 145 MG tablet TAKE 1 TABLET BY MOUTH EVERY DAY 01/14/17   [place], Peter  M, MD  gabapentin (NEURONTIN) 300 MG capsule Take 300 mg by mouth at bedtime. 03/15/17   [provider]  Multiple Vitamins-Minerals (MENS MULTIVITAMIN PLUS PO) Take 1 tablet by mouth 2 (two) times daily.    [provider]  pantoprazole (PROTONIX) 40 MG tablet TAKE 1 TABLET BY MOUTH EVERY DAY AT 12 NOON 04/14/16   Azalee Course, [place]  Probiotic Product (VSL#3) CAPS Take 1 capsule by mouth at bedtime. 04/02/15   Charlie Pitter III, MD  ramipril (ALTACE) 2.5 MG capsule Take 1 capsule (2.5 mg total) by mouth daily. KEEP OV. 05/10/17   [place], Peter M, MD  zolpidem (AMBIEN) 10 MG tablet Take 10 mg by mouth at bedtime.    [provider]    Family History Family History  Problem Relation Age of Onset  . Heart attack Father   . Diabetes Father   . Breast cancer Mother   . Coronary artery disease Mother   . Heart disease Brother   . Heart attack Maternal Grandfather     Social History Social History   Tobacco Use  . Smoking status: Former Smoker    Packs/day: 2.00    Years: 20.00    Pack years: 40.00    Types: Cigarettes    Last attempt to quit: 05/07/1999    Years since quitting: 18.1  . Smokeless tobacco: Never Used  Substance Use Topics  . Alcohol use: Yes    Alcohol/week: 3.6 oz    Types: 6 Cans of beer per week    Comment: occasional  . Drug use: No     Allergies   Ezetimibe; Niacin; and Statins   Review of Systems Review of Systems  Constitutional: Negative for chills and fever.  HENT: Negative for ear pain and sore throat.   Eyes: Negative for pain and visual disturbance.  Respiratory: Positive for cough, sputum production and shortness of breath.   Cardiovascular: Positive for chest pain. Negative for palpitations, orthopnea and leg swelling.  Gastrointestinal: Negative for abdominal pain, nausea and vomiting.  Genitourinary: Negative for dysuria and hematuria.  Musculoskeletal: Negative for arthralgias and back pain.  Skin: Negative for color change and rash.  Neurological: Negative for seizures, syncope and weakness.  All other systems reviewed and are negative.    Physical Exam Updated Vital Signs BP 110/75   Pulse 87   Temp 98.6 F (37 C) (Oral)   Resp (!) 23   Ht 6\' 2"  (1.88 m)   Wt 104.3 kg (230 lb)   SpO2 92%   BMI 29.53 kg/m   Physical Exam  Constitutional: He appears well-developed and well-nourished.  HENT:  Head: Normocephalic and atraumatic.  Eyes: Conjunctivae are normal.  Neck: Neck supple.  Cardiovascular: Regular rhythm. Tachycardia present.  No murmur heard. Pulmonary/Chest: Effort normal. Tachypnea noted. No respiratory distress. He has  rales (bilateral).  Abdominal: Soft. There is no tenderness.  Musculoskeletal: He exhibits no edema.       Right lower leg: Normal. He exhibits no edema.       Left lower leg: Normal. He exhibits no edema.  Neurological: He is alert.  Skin: Skin is warm and dry.  Psychiatric: He has a normal mood and affect.  Nursing note and vitals reviewed.   ED Treatments / Results  Labs (all labs ordered are listed, but only abnormal results are displayed) Labs Reviewed  COMPREHENSIVE METABOLIC PANEL - Abnormal; Notable for the following components:      Result Value   Glucose,  Bld 159 (*)    BUN 24 (*)    Total Protein 6.3 (*)    Alkaline Phosphatase 35 (*)    All other components within normal limits  BRAIN NATRIURETIC PEPTIDE - Abnormal; Notable for the following components:   B Natriuretic Peptide 177.1 (*)    All other components within normal limits  TROPONIN I - Abnormal; Notable for the following components:   Troponin I 0.11 (*)    All other components within normal limits  D-DIMER, QUANTITATIVE (NOT AT [facility]) - Abnormal; Notable for the following components:   D-Dimer, Quant 1.88 (*)    All other components within normal limits  TROPONIN I - Abnormal; Notable for the following components:   Troponin I 0.21 (*)    All other components within normal limits  CBC WITH DIFFERENTIAL/PLATELET  HEPARIN LEVEL (UNFRACTIONATED)  CBC  I-STAT TROPONIN, ED    EKG EKG Interpretation  Date/Time:  Monday June 07 2017 17:18:07 EDT Ventricular Rate:  108 PR Interval:    QRS Duration: 194 QT Interval:  431 QTC Calculation: 578 R Axis:   67 Text Interpretation:  Ectopic atrial tachycardia, unifocal IVCD, consider atypical LBBB Baseline wander in lead(s) V6 tachycardia new from previous Confirmed by Frederick Peers [phone]) on 06/07/2017 5:26:07 PM   Radiology Ct Angio Chest Pe W/cm &/or Wo Cm  Result Date: 06/07/2017 CLINICAL DATA:  Acute onset of generalized chest pain and dizziness.  Productive cough. Elevated D-dimer. EXAM: CT ANGIOGRAPHY CHEST WITH CONTRAST TECHNIQUE: Multidetector CT imaging of the chest was performed using the standard protocol during bolus administration of intravenous contrast. Multiplanar CT image reconstructions and MIPs were obtained to evaluate the vascular anatomy. CONTRAST:  ISOVUE-370 IOPAMIDOL (ISOVUE-370) INJECTION 76% COMPARISON:  Chest radiograph performed earlier today at 5:31 p.m. FINDINGS: Cardiovascular:  There is no evidence of pulmonary embolus. The heart is borderline normal in size. Scattered coronary artery calcifications are seen. Mild calcification is noted at the aortic arch and proximal great vessels. Mediastinum/Nodes: No mediastinal lymphadenopathy is seen. No pericardial effusion is identified. The thyroid gland is unremarkable. No axillary lymphadenopathy is appreciated. Lungs/Pleura: Hazy bilateral airspace opacification is noted, with interstitial prominence, concerning for mild pulmonary edema. A few blebs are noted at the right lung base. No significant pleural effusion or pneumothorax is seen. Upper Abdomen: The visualized portions of the liver and spleen are unremarkable. The gallbladder is unremarkable in appearance. The visualized portions of the pancreas and right adrenal gland are within normal limits. Musculoskeletal: No acute osseous abnormalities are identified. Anterior cervical spinal fusion hardware is partially imaged. The visualized musculature is unremarkable in appearance. Review of the MIP images confirms the above findings. IMPRESSION: 1. No evidence of pulmonary embolus. 2. Hazy bilateral airspace opacification, with interstitial prominence, concerning for mild pulmonary edema. 3. Few blebs at the right lung base. 4. Scattered coronary artery calcifications seen. Electronically Signed   By: Roanna Raider M.D.   On: 06/07/2017 22:54   Dg Chest Port 1 View  Result Date: 06/07/2017 CLINICAL DATA:  Hypoxia, shortness  of Breath EXAM: PORTABLE CHEST 1 VIEW COMPARISON:  11/19/2014 FINDINGS: Mild cardiomegaly. Diffuse interstitial prominence throughout the lungs. Bibasilar atelectasis or scarring. No effusions or acute bony abnormality. IMPRESSION: Cardiomegaly. Diffuse interstitial prominence could reflect chronic interstitial changes or interstitial edema. Bibasilar atelectasis or scarring. Electronically Signed   By: Charlett Nose M.D.   On: 06/07/2017 17:47    Procedures Procedures (including critical care time)  Medications Ordered in ED Medications  iopamidol (  ISOVUE-370) 76 % injection (has no administration in time range)  furosemide (LASIX) injection 40 mg (has no administration in time range)  heparin bolus via infusion 4,000 Units (has no administration in time range)  heparin ADULT infusion 100 units/mL (25000 units/269mL sodium chloride 0.45%) (has no administration in time range)  nitroGLYCERIN (NITROGLYN) 2 % ointment 1 inch (1 inch Topical Given 06/07/17 1742)  furosemide (LASIX) injection 40 mg (40 mg Intravenous Given 06/07/17 1958)  iopamidol (ISOVUE-370) 76 % injection 100 mL (100 mLs Intravenous Contrast Given 06/07/17 2213)     Initial Impression / Assessment and Plan / ED Course  I have reviewed the triage vital signs and the nursing notes.  Pertinent labs & imaging results that were available during my care of the patient were reviewed by me and considered in my medical decision making (see chart for details).     [name] is a 62 y.o. male with PMHx of CAD, CHF who p/w sudden onset dyspnea with chest pain. Reviewed and confirmed nursing documentation for past medical history, family history, social history. VS afebrile, 90% on 3L, BP 157/94. Exam remarkable for crackles. Story c/w flash pulmonary edema. Possible ACS. Consider PE.   ASA already given by EMS. EKG as above with no acute changes, old LBBB. After treatment with IV lasix, nitro paste, pt now CP free, on RA satting 90-91%.  Trop 0.11 -> 0.21. CMP unremarkable. BNP mildly elevated 177. CBC unremarkable. CXR with cardiomegaly, interstitial edema. D dimer elevated, CTA chest performed with no PE, mild pulmonary edema.   Old records reviewed. Labs reviewed by me and used in the medical decision making.  Imaging viewed and interpreted by me and used in the medical decision making (formal interpretation from radiologist). EKG reviewed by me and used in the medical decision making.  Admitted to cardiology.    Final Clinical Impressions(s) / ED Diagnoses   Final diagnoses:  Acute pulmonary edema (HCC)  Chest pain, unspecified type  Elevated troponin    ED Discharge Orders    None       Diannia Ruder, MD 06/08/17 1610    Clarene Duke Ambrose Finland, MD 06/10/17 1452

## 2017-06-08 ENCOUNTER — Encounter (HOSPITAL_COMMUNITY)
Admission: EM | Disposition: A | Discharge status: Home / Self Care | Payer: Self-pay | Source: Home / Self Care | Attending: Cardiothoracic Surgery

## 2017-06-08 ENCOUNTER — Other Ambulatory Visit: Payer: Self-pay

## 2017-06-08 ENCOUNTER — Inpatient Hospital Stay (HOSPITAL_COMMUNITY): Payer: BLUE CROSS/BLUE SHIELD

## 2017-06-08 DIAGNOSIS — I252 Old myocardial infarction: Secondary | ICD-10-CM | POA: Diagnosis not present

## 2017-06-08 DIAGNOSIS — N35812 Other urethral bulbous stricture, male: Secondary | ICD-10-CM | POA: Diagnosis not present

## 2017-06-08 DIAGNOSIS — R05 Cough: Secondary | ICD-10-CM | POA: Diagnosis not present

## 2017-06-08 DIAGNOSIS — M1990 Unspecified osteoarthritis, unspecified site: Secondary | ICD-10-CM | POA: Diagnosis present

## 2017-06-08 DIAGNOSIS — D689 Coagulation defect, unspecified: Secondary | ICD-10-CM | POA: Diagnosis not present

## 2017-06-08 DIAGNOSIS — Z87442 Personal history of urinary calculi: Secondary | ICD-10-CM | POA: Diagnosis not present

## 2017-06-08 DIAGNOSIS — R5381 Other malaise: Secondary | ICD-10-CM | POA: Diagnosis not present

## 2017-06-08 DIAGNOSIS — I351 Nonrheumatic aortic (valve) insufficiency: Secondary | ICD-10-CM | POA: Diagnosis not present

## 2017-06-08 DIAGNOSIS — R0789 Other chest pain: Secondary | ICD-10-CM | POA: Diagnosis not present

## 2017-06-08 DIAGNOSIS — I214 Non-ST elevation (NSTEMI) myocardial infarction: Secondary | ICD-10-CM | POA: Diagnosis not present

## 2017-06-08 DIAGNOSIS — Z951 Presence of aortocoronary bypass graft: Secondary | ICD-10-CM | POA: Diagnosis not present

## 2017-06-08 DIAGNOSIS — Z888 Allergy status to other drugs, medicaments and biological substances status: Secondary | ICD-10-CM | POA: Diagnosis not present

## 2017-06-08 DIAGNOSIS — Z8249 Family history of ischemic heart disease and other diseases of the circulatory system: Secondary | ICD-10-CM | POA: Diagnosis not present

## 2017-06-08 DIAGNOSIS — I447 Left bundle-branch block, unspecified: Secondary | ICD-10-CM | POA: Diagnosis not present

## 2017-06-08 DIAGNOSIS — I509 Heart failure, unspecified: Secondary | ICD-10-CM | POA: Diagnosis not present

## 2017-06-08 DIAGNOSIS — I472 Ventricular tachycardia: Secondary | ICD-10-CM | POA: Diagnosis not present

## 2017-06-08 DIAGNOSIS — I249 Acute ischemic heart disease, unspecified: Secondary | ICD-10-CM | POA: Diagnosis not present

## 2017-06-08 DIAGNOSIS — I2510 Atherosclerotic heart disease of native coronary artery without angina pectoris: Secondary | ICD-10-CM | POA: Diagnosis not present

## 2017-06-08 DIAGNOSIS — I5023 Acute on chronic systolic (congestive) heart failure: Secondary | ICD-10-CM | POA: Diagnosis not present

## 2017-06-08 DIAGNOSIS — R079 Chest pain, unspecified: Secondary | ICD-10-CM | POA: Diagnosis not present

## 2017-06-08 DIAGNOSIS — Z01818 Encounter for other preprocedural examination: Secondary | ICD-10-CM | POA: Diagnosis not present

## 2017-06-08 DIAGNOSIS — Z01810 Encounter for preprocedural cardiovascular examination: Secondary | ICD-10-CM | POA: Diagnosis not present

## 2017-06-08 DIAGNOSIS — J90 Pleural effusion, not elsewhere classified: Secondary | ICD-10-CM | POA: Diagnosis not present

## 2017-06-08 DIAGNOSIS — I080 Rheumatic disorders of both mitral and aortic valves: Secondary | ICD-10-CM | POA: Diagnosis not present

## 2017-06-08 DIAGNOSIS — I9581 Postprocedural hypotension: Secondary | ICD-10-CM | POA: Diagnosis not present

## 2017-06-08 DIAGNOSIS — R338 Other retention of urine: Secondary | ICD-10-CM | POA: Diagnosis not present

## 2017-06-08 DIAGNOSIS — D62 Acute posthemorrhagic anemia: Secondary | ICD-10-CM | POA: Diagnosis not present

## 2017-06-08 DIAGNOSIS — E7800 Pure hypercholesterolemia, unspecified: Secondary | ICD-10-CM

## 2017-06-08 DIAGNOSIS — J810 Acute pulmonary edema: Secondary | ICD-10-CM | POA: Diagnosis not present

## 2017-06-08 DIAGNOSIS — R9431 Abnormal electrocardiogram [ECG] [EKG]: Secondary | ICD-10-CM | POA: Diagnosis not present

## 2017-06-08 DIAGNOSIS — R55 Syncope and collapse: Secondary | ICD-10-CM | POA: Diagnosis not present

## 2017-06-08 DIAGNOSIS — Z436 Encounter for attention to other artificial openings of urinary tract: Secondary | ICD-10-CM | POA: Diagnosis not present

## 2017-06-08 DIAGNOSIS — N35112 Postinfective bulbous urethral stricture, not elsewhere classified: Secondary | ICD-10-CM | POA: Diagnosis not present

## 2017-06-08 DIAGNOSIS — I25110 Atherosclerotic heart disease of native coronary artery with unstable angina pectoris: Secondary | ICD-10-CM | POA: Diagnosis not present

## 2017-06-08 DIAGNOSIS — I8390 Asymptomatic varicose veins of unspecified lower extremity: Secondary | ICD-10-CM | POA: Diagnosis present

## 2017-06-08 DIAGNOSIS — Z955 Presence of coronary angioplasty implant and graft: Secondary | ICD-10-CM

## 2017-06-08 DIAGNOSIS — Z79899 Other long term (current) drug therapy: Secondary | ICD-10-CM | POA: Diagnosis not present

## 2017-06-08 DIAGNOSIS — N35819 Other urethral stricture, male, unspecified site: Secondary | ICD-10-CM | POA: Diagnosis not present

## 2017-06-08 DIAGNOSIS — M7989 Other specified soft tissue disorders: Secondary | ICD-10-CM | POA: Diagnosis not present

## 2017-06-08 DIAGNOSIS — Z981 Arthrodesis status: Secondary | ICD-10-CM | POA: Diagnosis not present

## 2017-06-08 DIAGNOSIS — Y92230 Patient room in hospital as the place of occurrence of the external cause: Secondary | ICD-10-CM | POA: Diagnosis not present

## 2017-06-08 DIAGNOSIS — I130 Hypertensive heart and chronic kidney disease with heart failure and stage 1 through stage 4 chronic kidney disease, or unspecified chronic kidney disease: Secondary | ICD-10-CM | POA: Diagnosis not present

## 2017-06-08 DIAGNOSIS — J9811 Atelectasis: Secondary | ICD-10-CM | POA: Diagnosis not present

## 2017-06-08 DIAGNOSIS — R0902 Hypoxemia: Secondary | ICD-10-CM | POA: Diagnosis not present

## 2017-06-08 DIAGNOSIS — I5043 Acute on chronic combined systolic (congestive) and diastolic (congestive) heart failure: Secondary | ICD-10-CM | POA: Diagnosis not present

## 2017-06-08 DIAGNOSIS — I160 Hypertensive urgency: Secondary | ICD-10-CM

## 2017-06-08 DIAGNOSIS — I255 Ischemic cardiomyopathy: Secondary | ICD-10-CM | POA: Diagnosis not present

## 2017-06-08 DIAGNOSIS — M109 Gout, unspecified: Secondary | ICD-10-CM | POA: Diagnosis present

## 2017-06-08 DIAGNOSIS — Z7982 Long term (current) use of aspirin: Secondary | ICD-10-CM | POA: Diagnosis not present

## 2017-06-08 DIAGNOSIS — R0602 Shortness of breath: Secondary | ICD-10-CM | POA: Diagnosis not present

## 2017-06-08 DIAGNOSIS — I110 Hypertensive heart disease with heart failure: Secondary | ICD-10-CM | POA: Diagnosis not present

## 2017-06-08 HISTORY — PX: RIGHT/LEFT HEART CATH AND CORONARY ANGIOGRAPHY: CATH118266

## 2017-06-08 LAB — CBC
HCT: 43.5 % (ref 39.0–52.0)
Hemoglobin: 14.2 g/dL (ref 13.0–17.0)
MCH: 30.3 pg (ref 26.0–34.0)
MCHC: 32.6 g/dL (ref 30.0–36.0)
MCV: 92.9 fL (ref 78.0–100.0)
Platelets: 257 10*3/uL (ref 150–400)
RBC: 4.68 MIL/uL (ref 4.22–5.81)
RDW: 13.5 % (ref 11.5–15.5)
WBC: 7.3 10*3/uL (ref 4.0–10.5)

## 2017-06-08 LAB — POCT I-STAT 3, VENOUS BLOOD GAS (G3P V)
Acid-Base Excess: 1 mmol/L (ref 0.0–2.0)
Bicarbonate: 25 mmol/L (ref 20.0–28.0)
Bicarbonate: 26.1 mmol/L (ref 20.0–28.0)
O2 Saturation: 73 %
O2 Saturation: 94 %
TCO2: 26 mmol/L (ref 22–32)
TCO2: 27 mmol/L (ref 22–32)
pCO2, Ven: 41.4 mmHg — ABNORMAL LOW (ref 44.0–60.0)
pCO2, Ven: 42.7 mmHg — ABNORMAL LOW (ref 44.0–60.0)
pH, Ven: 7.389 (ref 7.250–7.430)
pH, Ven: 7.395 (ref 7.250–7.430)
pO2, Ven: 39 mmHg (ref 32.0–45.0)
pO2, Ven: 70 mmHg — ABNORMAL HIGH (ref 32.0–45.0)

## 2017-06-08 LAB — BASIC METABOLIC PANEL
Anion gap: 11 (ref 5–15)
BUN: 22 mg/dL — ABNORMAL HIGH (ref 6–20)
CO2: 28 mmol/L (ref 22–32)
Calcium: 9 mg/dL (ref 8.9–10.3)
Chloride: 102 mmol/L (ref 101–111)
Creatinine, Ser: 1.28 mg/dL — ABNORMAL HIGH (ref 0.61–1.24)
GFR calc Af Amer: >60 mL/min (ref >60)
GFR calc non Af Amer: 58 mL/min — ABNORMAL LOW (ref >60)
Glucose, Bld: 102 mg/dL — ABNORMAL HIGH (ref 65–99)
Potassium: 3.5 mmol/L (ref 3.5–5.1)
Sodium: 141 mmol/L (ref 135–145)

## 2017-06-08 LAB — POCT ACTIVATED CLOTTING TIME: Activated Clotting Time: 169 seconds

## 2017-06-08 LAB — TROPONIN I
Troponin I: 0.28 ng/mL (ref <0.03)
Troponin I: 0.21 ng/mL (ref <0.03)
Troponin I: 0.22 ng/mL (ref <0.03)

## 2017-06-08 LAB — HEPARIN LEVEL (UNFRACTIONATED): Heparin Unfractionated: 0.44 IU/mL (ref 0.30–0.70)

## 2017-06-08 LAB — HIV ANTIBODY (ROUTINE TESTING W REFLEX): HIV Screen 4th Generation wRfx: NONREACTIVE

## 2017-06-08 LAB — PROTIME-INR
INR: 1.11
Prothrombin Time: 14.2 seconds (ref 11.4–15.2)

## 2017-06-08 LAB — MRSA PCR SCREENING: MRSA by PCR: NEGATIVE

## 2017-06-08 SURGERY — RIGHT/LEFT HEART CATH AND CORONARY ANGIOGRAPHY
Anesthesia: LOCAL

## 2017-06-08 MED ORDER — SODIUM CHLORIDE 0.9% FLUSH: 3.0000 mL | Freq: Two times a day (BID) | INTRAVENOUS | Status: DC

## 2017-06-08 MED ORDER — HEPARIN (PORCINE) IN NACL 2-0.9 UNITS/ML
INTRAMUSCULAR | Status: AC | PRN
  Administered 2017-06-08 (×2): 500 mL

## 2017-06-08 MED ORDER — SODIUM CHLORIDE 0.9% FLUSH: 3.0000 mL | INTRAVENOUS | Status: DC | PRN

## 2017-06-08 MED ORDER — HEPARIN BOLUS VIA INFUSION
4000.0000 [IU] | Freq: Once | INTRAVENOUS | Status: AC
  Administered 2017-06-08: 4000 [IU] via INTRAVENOUS
  Filled 2017-06-08: qty 4000

## 2017-06-08 MED ORDER — POTASSIUM CHLORIDE CRYS ER 20 MEQ PO TBCR
20.0000 meq | EXTENDED_RELEASE_TABLET | Freq: Once | ORAL | Status: AC
  Administered 2017-06-08: 20 meq via ORAL
  Filled 2017-06-08: qty 1

## 2017-06-08 MED ORDER — RAMIPRIL 2.5 MG PO CAPS
2.5000 mg | ORAL_CAPSULE | Freq: Every day | ORAL | Status: DC
  Filled 2017-06-08: qty 1

## 2017-06-08 MED ORDER — AMLODIPINE BESYLATE 5 MG PO TABS
5.0000 mg | ORAL_TABLET | Freq: Every day | ORAL | Status: DC
  Administered 2017-06-08: 5 mg via ORAL
  Filled 2017-06-08: qty 1

## 2017-06-08 MED ORDER — CARVEDILOL 3.125 MG PO TABS
3.1250 mg | ORAL_TABLET | Freq: Two times a day (BID) | ORAL | Status: DC
  Administered 2017-06-09 – 2017-06-13 (×10): 3.125 mg via ORAL
  Filled 2017-06-08 (×10): qty 1

## 2017-06-08 MED ORDER — HEPARIN (PORCINE) IN NACL 100-0.45 UNIT/ML-% IJ SOLN
1400.0000 [IU]/h | INTRAMUSCULAR | Status: DC
  Administered 2017-06-08: 1400 [IU]/h via INTRAVENOUS
  Filled 2017-06-08: qty 250

## 2017-06-08 MED ORDER — NITROGLYCERIN 0.4 MG SL SUBL
0.4000 mg | SUBLINGUAL_TABLET | SUBLINGUAL | Status: DC | PRN
  Administered 2017-06-09: 0.4 mg via SUBLINGUAL
  Filled 2017-06-08: qty 1

## 2017-06-08 MED ORDER — IOHEXOL 350 MG/ML SOLN
INTRAVENOUS | Status: DC | PRN
  Administered 2017-06-08: 140 mL via INTRAVENOUS

## 2017-06-08 MED ORDER — FENOFIBRATE 54 MG PO TABS
54.0000 mg | ORAL_TABLET | Freq: Every day | ORAL | Status: DC
  Administered 2017-06-08 – 2017-06-10 (×3): 54 mg via ORAL
  Filled 2017-06-08 (×3): qty 1

## 2017-06-08 MED ORDER — ASPIRIN 81 MG PO TABS: 81.0000 mg | ORAL_TABLET | Freq: Every day | ORAL | Status: DC

## 2017-06-08 MED ORDER — FUROSEMIDE 10 MG/ML IJ SOLN
40.0000 mg | Freq: Once | INTRAMUSCULAR | Status: AC
  Administered 2017-06-08: 40 mg via INTRAVENOUS
  Filled 2017-06-08: qty 4

## 2017-06-08 MED ORDER — HEPARIN SODIUM (PORCINE) 1000 UNIT/ML IJ SOLN
INTRAMUSCULAR | Status: DC | PRN
  Administered 2017-06-08: 5000 [IU] via INTRAVENOUS

## 2017-06-08 MED ORDER — GABAPENTIN 300 MG PO CAPS
300.0000 mg | ORAL_CAPSULE | Freq: Every day | ORAL | Status: DC
  Administered 2017-06-08 – 2017-06-13 (×7): 300 mg via ORAL
  Filled 2017-06-08 (×7): qty 1

## 2017-06-08 MED ORDER — ZOLPIDEM TARTRATE 5 MG PO TABS
10.0000 mg | ORAL_TABLET | Freq: Every day | ORAL | Status: DC
  Administered 2017-06-08 – 2017-06-13 (×7): 10 mg via ORAL
  Filled 2017-06-08 (×7): qty 2

## 2017-06-08 MED ORDER — SODIUM CHLORIDE 0.9 % IV SOLN: INTRAVENOUS | Status: DC

## 2017-06-08 MED ORDER — ASPIRIN 81 MG PO CHEW
81.0000 mg | CHEWABLE_TABLET | ORAL | Status: AC
  Administered 2017-06-08: 81 mg via ORAL
  Filled 2017-06-08: qty 1

## 2017-06-08 MED ORDER — MIDAZOLAM HCL 2 MG/2ML IJ SOLN
INTRAMUSCULAR | Status: DC | PRN
  Administered 2017-06-08 (×3): 1 mg via INTRAVENOUS

## 2017-06-08 MED ORDER — FUROSEMIDE 40 MG PO TABS
40.0000 mg | ORAL_TABLET | Freq: Every day | ORAL | Status: DC
Start: 2017-06-09 — End: 2017-06-11
  Administered 2017-06-09 – 2017-06-10 (×2): 40 mg via ORAL
  Filled 2017-06-08 (×2): qty 1

## 2017-06-08 MED ORDER — PANTOPRAZOLE SODIUM 20 MG PO TBEC
20.0000 mg | DELAYED_RELEASE_TABLET | Freq: Every day | ORAL | Status: DC
  Administered 2017-06-09 – 2017-06-13 (×5): 20 mg via ORAL
  Filled 2017-06-08 (×6): qty 1

## 2017-06-08 MED ORDER — ONDANSETRON HCL 4 MG/2ML IJ SOLN: 4.0000 mg | Freq: Four times a day (QID) | INTRAMUSCULAR | Status: DC | PRN

## 2017-06-08 MED ORDER — ASPIRIN EC 81 MG PO TBEC
81.0000 mg | DELAYED_RELEASE_TABLET | Freq: Every day | ORAL | Status: DC
  Administered 2017-06-09 – 2017-06-13 (×5): 81 mg via ORAL
  Filled 2017-06-08 (×5): qty 1

## 2017-06-08 MED ORDER — SODIUM CHLORIDE 0.9 % IV SOLN: 250.0000 mL | INTRAVENOUS | Status: DC | PRN

## 2017-06-08 MED ORDER — FENTANYL CITRATE (PF) 100 MCG/2ML IJ SOLN
INTRAMUSCULAR | Status: DC | PRN
  Administered 2017-06-08: 25 ug via INTRAVENOUS

## 2017-06-08 MED ORDER — HEPARIN (PORCINE) IN NACL 100-0.45 UNIT/ML-% IJ SOLN
1400.0000 [IU]/h | INTRAMUSCULAR | Status: DC
  Administered 2017-06-09 – 2017-06-13 (×6): 1400 [IU]/h via INTRAVENOUS
  Filled 2017-06-08 (×7): qty 250

## 2017-06-08 MED ORDER — LIDOCAINE HCL (PF) 1 % IJ SOLN
INTRAMUSCULAR | Status: DC | PRN
  Administered 2017-06-08: 8 mL
  Administered 2017-06-08 (×2): 2 mL

## 2017-06-08 MED ORDER — HEPARIN (PORCINE) IN NACL 2-0.9 UNITS/ML
INTRAMUSCULAR | Status: DC | PRN
  Administered 2017-06-08: 10 mL via INTRA_ARTERIAL

## 2017-06-08 MED ORDER — SODIUM CHLORIDE 0.9% FLUSH
3.0000 mL | Freq: Two times a day (BID) | INTRAVENOUS | Status: DC
  Administered 2017-06-09 – 2017-06-13 (×5): 3 mL via INTRAVENOUS

## 2017-06-08 MED ORDER — ACETAMINOPHEN 325 MG PO TABS
650.0000 mg | ORAL_TABLET | ORAL | Status: DC | PRN
  Administered 2017-06-08 – 2017-06-10 (×5): 650 mg via ORAL
  Filled 2017-06-08 (×5): qty 2

## 2017-06-08 MED ORDER — FENTANYL CITRATE (PF) 100 MCG/2ML IJ SOLN
INTRAMUSCULAR | Status: DC | PRN
  Administered 2017-06-08 (×2): 25 ug via INTRAVENOUS

## 2017-06-08 MED ORDER — ISOSORBIDE MONONITRATE ER 30 MG PO TB24
15.0000 mg | ORAL_TABLET | Freq: Every day | ORAL | Status: DC
  Administered 2017-06-09 – 2017-06-10 (×2): 15 mg via ORAL
  Filled 2017-06-08 (×2): qty 1

## 2017-06-08 MED ORDER — NITROGLYCERIN 2 % TD OINT
1.0000 [in_us] | TOPICAL_OINTMENT | Freq: Four times a day (QID) | TRANSDERMAL | Status: DC
  Administered 2017-06-08: 1 [in_us] via TOPICAL
  Filled 2017-06-08: qty 30

## 2017-06-08 SURGICAL SUPPLY — 24 items
CATH BALLN WEDGE 5F 110CM (CATHETERS) ×1 IMPLANT
CATH INFINITI 5 FR JL3.5 (CATHETERS) ×1 IMPLANT
CATH INFINITI 5FR ANG PIGTAIL (CATHETERS) ×1 IMPLANT
CATH INFINITI JR4 5F (CATHETERS) ×1 IMPLANT
CATH SWAN GANZ 7F STRAIGHT (CATHETERS) ×1 IMPLANT
COVER DOME SNAP 22 D (MISCELLANEOUS) ×1 IMPLANT
COVER PRB 48X5XTLSCP FOLD TPE (BAG) IMPLANT
COVER PROBE 5X48 (BAG)
DEVICE RAD COMP TR BAND LRG (VASCULAR PRODUCTS) ×1 IMPLANT
GUIDEWIRE .025 260CM (WIRE) ×1 IMPLANT
GUIDEWIRE INQWIRE 1.5J.035X260 (WIRE) IMPLANT
INQWIRE 1.5J .035X260CM (WIRE) ×2
KIT HEART LEFT (KITS) ×2 IMPLANT
NDL PERC 21GX4CM (NEEDLE) IMPLANT
NEEDLE PERC 21GX4CM (NEEDLE) ×2 IMPLANT
PACK CARDIAC CATHETERIZATION (CUSTOM PROCEDURE TRAY) ×2 IMPLANT
SHEATH AVANTI 11CM 7FR (SHEATH) ×1 IMPLANT
SHEATH RAIN 4/5FR (SHEATH) ×1 IMPLANT
SHEATH RAIN RADIAL 21G 6FR (SHEATH) ×1 IMPLANT
SYR MEDRAD MARK V 150ML (SYRINGE) ×3 IMPLANT
TRANSDUCER W/STOPCOCK (MISCELLANEOUS) ×2 IMPLANT
TUBING CIL FLEX 10 FLL-RA (TUBING) ×2 IMPLANT
TUBING CONTRAST HIGH PRESS 20 (MISCELLANEOUS) ×1 IMPLANT
WIRE HI TORQ VERSACORE-J 145CM (WIRE) ×1 IMPLANT

## 2017-06-08 NOTE — Progress Notes (Signed)
ANTICOAGULATION CONSULT NOTE - Initial Consult  Pharmacy Consult for heparin Indication: chest pain/ACS  Allergies  Allergen Reactions  . Ezetimibe Other (See Comments)    Joint pain and drops BP  . Niacin Other (See Comments)    Drops BP and severe  . Statins Other (See Comments)    Severe joint pain and drops BP.    Patient Measurements: Height: 6\' 2"  (188 cm) Weight: 230 lb (104.3 kg) IBW/kg (Calculated) : 82.2 Heparin Dosing Weight: 103 kg  Vital Signs: Temp: 98.6 F (37 C) (06/03 1726) Temp Source: Oral (06/03 1726) BP: 146/83 (06/04 0000) Pulse Rate: 100 (06/04 0000)  Labs: Recent Labs    06/07/17 1733 06/07/17 2134  HGB 14.3  --   HCT 43.9  --   PLT 267  --   CREATININE 1.22  --   TROPONINI 0.11* 0.21*    Estimated Creatinine Clearance: 79.8 mL/min (by C-G formula based on SCr of 1.22 mg/dL).   Medical History: Past Medical History:  Diagnosis Date  . Arthritis   . CHF (congestive heart failure) (HCC)   . Coronary artery disease   . Gout   . Hypercholesterolemia   . Hypertension   . Kidney stone    approx 5 years ago  . LBBB (left bundle branch block)   . MI (myocardial infarction) (HCC) 1993   LATERAL  . Pneumonia   . S/P coronary artery stent placement    RCA  . Varicose veins     Medications:  See medication history  Assessment: 62 yo man to start heparin for CP.  He was not on anticoagulation PTA Goal of Therapy:  Heparin level 0.3-0.7 units/ml Monitor platelets by anticoagulation protocol: Yes   Plan:  Heparin 4000 unit bolus and drip at 1400 units/hr Check heparin level 6-8 hours after start Daily HL and CBC while on heparin Monitor for bleeding complications  Talbert Cage Poteet 06/08/2017,12:22 AM

## 2017-06-08 NOTE — Progress Notes (Addendum)
ANTICOAGULATION CONSULT NOTE  Pharmacy Consult for heparin Indication: chest pain/ACS  Allergies  Allergen Reactions  . Ezetimibe Other (See Comments)    Joint pain and drops BP  . Niacin Other (See Comments)    Drops BP and severe  . Statins Other (See Comments)    Severe joint pain and drops BP.    Patient Measurements: Height: 6\' 2"  (188 cm) Weight: 225 lb (102.1 kg) IBW/kg (Calculated) : 82.2 Heparin Dosing Weight: 103 kg  Vital Signs: Temp: 98.1 F (36.7 C) (06/04 1246) Temp Source: Oral (06/04 1246) BP: 120/58 (06/04 1800) Pulse Rate: 76 (06/04 1800)  Labs: Recent Labs    06/07/17 1733  06/08/17 0122 06/08/17 0720 06/08/17 1100  HGB 14.3  --   --  14.2  --   HCT 43.9  --   --  43.5  --   PLT 267  --   --  257  --   LABPROT  --   --   --  14.2  --   INR  --   --   --  1.11  --   HEPARINUNFRC  --   --   --  0.44  --   CREATININE 1.22  --   --  1.28*  --   TROPONINI 0.11*   < > 0.28* 0.21* 0.22*   < > = values in this interval not displayed.    Estimated Creatinine Clearance: 75.4 mL/min (A) (by C-G formula based on SCr of 1.28 mg/dL (H)).   Medical History: Past Medical History:  Diagnosis Date  . Arthritis   . CHF (congestive heart failure) (HCC)   . Coronary artery disease   . Gout   . Hypercholesterolemia   . Hypertension   . Kidney stone    approx 5 years ago  . LBBB (left bundle branch block)   . MI (myocardial infarction) (HCC) 1993   LATERAL  . Pneumonia   . S/P coronary artery stent placement    RCA  . Varicose veins      Assessment: 62 yo man admitted with CP (noted with history of CAD. Last cath in 2016).  S/p cath with EF 20% and 3V CAD - TCTS consult He was not on anticoagulation PTA. Heparin stared on admit  - plan to restart 8hr after sheath out - -No bleeding noted, CBC stable  Goal of Therapy:  Heparin level 0.3-0.7 units/ml Monitor platelets by anticoagulation protocol: Yes   Plan:  Restart heparin at 0100 6/5 drip  rate 1400 uts/hr - no bolus Draw HL 6hr after restart and dai;y  Leota Sauers Pharm.D. CPP, BCPS Clinical Pharmacist [phone] 06/08/2017 8:21 PM

## 2017-06-08 NOTE — Progress Notes (Signed)
Site area: Right groin a 7 french venous sheath was removed  Site Prior to Removal:  Level 0  Pressure Applied For 20 MINUTES    Bedrest Beginning at 1800pm  Manual:   Yes.    Patient Status During Pull:  stable  Post Pull Groin Site:  Level 0  Post Pull Instructions Given:  Yes.    Post Pull Pulses Present:  Yes.    Dressing Applied:  Yes.    Comments:  VS remain stable

## 2017-06-08 NOTE — Interval H&P Note (Signed)
History and Physical Interval Note:  06/08/2017 3:48 PM  [name]  has presented today for surgery, with the diagnosis of cp  The various methods of treatment have been discussed with the patient and family. After consideration of risks, benefits and other options for treatment, the patient has consented to  Procedure(s): LEFT HEART CATH AND CORONARY ANGIOGRAPHY (N/A) as a surgical intervention .  The patient's history has been reviewed, patient examined, no change in status, stable for surgery.  I have reviewed the patient's chart and labs.  Questions were answered to the patient's satisfaction.   Cath Lab Visit (complete for each Cath Lab visit)  Clinical Evaluation Leading to the Procedure:   ACS: Yes.    Non-ACS:    Anginal Classification: CCS III  Anti-ischemic medical therapy: Maximal Therapy (2 or more classes of medications)  Non-Invasive Test Results: No non-invasive testing performed  Prior CABG: No previous CABG        Theron Arista [facility] 06/08/2017 3:49 PM

## 2017-06-08 NOTE — Progress Notes (Addendum)
 Progress Note  Patient Name: [name] Date of Encounter: 06/08/2017  Primary Cardiologist: Peter Jordan, MD   Subjective   No chest pain since 2 AM, has ntg paste.  Able to lie flat now.  Feels much better  Inpatient Medications    Scheduled Meds: . amLODipine  5 mg Oral Daily  . aspirin EC  81 mg Oral Daily  . fenofibrate  54 mg Oral Daily  . gabapentin  300 mg Oral QHS  . iopamidol      . pantoprazole  20 mg Oral Daily  . ramipril  2.5 mg Oral Daily  . zolpidem  10 mg Oral QHS   Continuous Infusions: . heparin 1,400 Units/hr (06/08/17 0132)   PRN Meds: acetaminophen, nitroGLYCERIN, ondansetron (ZOFRAN) IV   Vital Signs    Vitals:   06/08/17 0000 06/08/17 0030 06/08/17 0109 06/08/17 0449  BP: (!) 146/83 110/75 124/74 113/67  Pulse: 100 87 81 79  Resp: (!) 31 (!) 23  15  Temp:   97.8 F (36.6 C) (!) 97.5 F (36.4 C)  TempSrc:   Oral Oral  SpO2: 92% 92% 97% 90%  Weight: 230 lb (104.3 kg)  225 lb (102.1 kg)   Height: 6' 2" (1.88 m)  6' 2" (1.88 m)     Intake/Output Summary (Last 24 hours) at 06/08/2017 0756 Last data filed at 06/08/2017 0452 Gross per 24 hour  Intake -  Output 2750 ml  Net -2750 ml   Filed Weights   06/08/17 0000 06/08/17 0109  Weight: 230 lb (104.3 kg) 225 lb (102.1 kg)    Telemetry    SR with LBBB - Personally Reviewed  ECG    No new since admit - Personally Reviewed  Physical Exam   GEN: No acute distress.   Neck: No JVD Cardiac: RRR, no murmurs, rubs, or gallops.  Respiratory: Clear to diminished to auscultation bilaterally. GI: Soft, nontender, non-distended  MS: No edema; No deformity. Neuro:  Nonfocal  Psych: Normal affect   Labs    Chemistry Recent Labs  Lab 06/07/17 1733  NA 140  K 3.8  CL 107  CO2 22  GLUCOSE 159*  BUN 24*  CREATININE 1.22  CALCIUM 9.2  PROT 6.3*  ALBUMIN 3.9  AST 31  ALT 21  ALKPHOS 35*  BILITOT 0.7  GFRNONAA >60  GFRAA >60  ANIONGAP 11     Hematology Recent Labs  Lab  06/07/17 1733  WBC 4.7  RBC 4.69  HGB 14.3  HCT 43.9  MCV 93.6  MCH 30.5  MCHC 32.6  RDW 13.3  PLT 267    Cardiac Enzymes Recent Labs  Lab 06/07/17 1733 06/07/17 2134 06/08/17 0122  TROPONINI 0.11* 0.21* 0.28*   No results for input(s): TROPIPOC in the last 168 hours.   BNP Recent Labs  Lab 06/07/17 1733  BNP 177.1*     DDimer  Recent Labs  Lab 06/07/17 1959  DDIMER 1.88*     Radiology    Ct Angio Chest Pe W/cm &/or Wo Cm  Result Date: 06/07/2017 CLINICAL DATA:  Acute onset of generalized chest pain and dizziness. Productive cough. Elevated D-dimer. EXAM: CT ANGIOGRAPHY CHEST WITH CONTRAST TECHNIQUE: Multidetector CT imaging of the chest was performed using the standard protocol during bolus administration of intravenous contrast. Multiplanar CT image reconstructions and MIPs were obtained to evaluate the vascular anatomy. CONTRAST:  100mL ISOVUE-370 IOPAMIDOL (ISOVUE-370) INJECTION 76% COMPARISON:  Chest radiograph performed earlier today at 5:31 p.m. FINDINGS: Cardiovascular:    There is no evidence of pulmonary embolus. The heart is borderline normal in size. Scattered coronary artery calcifications are seen. Mild calcification is noted at the aortic arch and proximal great vessels. Mediastinum/Nodes: No mediastinal lymphadenopathy is seen. No pericardial effusion is identified. The thyroid gland is unremarkable. No axillary lymphadenopathy is appreciated. Lungs/Pleura: Hazy bilateral airspace opacification is noted, with interstitial prominence, concerning for mild pulmonary edema. A few blebs are noted at the right lung base. No significant pleural effusion or pneumothorax is seen. Upper Abdomen: The visualized portions of the liver and spleen are unremarkable. The gallbladder is unremarkable in appearance. The visualized portions of the pancreas and right adrenal gland are within normal limits. Musculoskeletal: No acute osseous abnormalities are identified. Anterior  cervical spinal fusion hardware is partially imaged. The visualized musculature is unremarkable in appearance. Review of the MIP images confirms the above findings. IMPRESSION: 1. No evidence of pulmonary embolus. 2. Hazy bilateral airspace opacification, with interstitial prominence, concerning for mild pulmonary edema. 3. Few blebs at the right lung base. 4. Scattered coronary artery calcifications seen. Electronically Signed   By: Jeffery  Chang M.D.   On: 06/07/2017 22:54   Dg Chest Port 1 View  Result Date: 06/07/2017 CLINICAL DATA:  Hypoxia, shortness of Breath EXAM: PORTABLE CHEST 1 VIEW COMPARISON:  11/19/2014 FINDINGS: Mild cardiomegaly. Diffuse interstitial prominence throughout the lungs. Bibasilar atelectasis or scarring. No effusions or acute bony abnormality. IMPRESSION: Cardiomegaly. Diffuse interstitial prominence could reflect chronic interstitial changes or interstitial edema. Bibasilar atelectasis or scarring. Electronically Signed   By: Kevin  Dover M.D.   On: 06/07/2017 17:47    Cardiac Studies   pending  Patient Profile     62 y.o. male with a past medical history of CAD status post familial hyperlipidemia on Repatha.  Has a history of multiple PCI in the past with the most recent one in 2016.  LVEF in 2016 was preserved and on a SPECT in 2018 was noted to drop to the 30s, now aadmitted with flash pulmonary edema.  Assessment & Plan    Flash pulmonary edema, last EF on nuc was 38% 02/2016 minimal ischemia and no significant change from 2012.    On Echo 11/20/14 EF was 50-55%.  Echo ordered for today  For cath today Neg 2750 since admit has rec'd total of 80 mg IV lasix.   The patient understands that risks included but are not limited to stroke (1 in 1000), death (1 in 1000), kidney failure [usually temporary] (1 in 500), bleeding (1 in 200), allergic reaction [possibly serious] (1 in 200). Pt and wife have agreed to proceed.   CAD with unstable angina --with last cath 2012  (prior stents to RCA in 2009) with non obstructive CAD in RCA, LAD and LCX (pt notes for last month at least at night he has indigestion at bedtime no matter when his last meal is and nothing really improved the discomfort.)   --tropoin 0.11; 0.21; 0.28  --NTG paste with relief of pain  HLD  On Repatha 08/2016 LDL was 18, TG 144 HDL 50  HTN controlled --will hold altace for cath today continue amlodipine   Elevated d dimer but neg CTA for PE.   LBBB chronic   For questions or updates, please contact [facility] Please consult [email or web address] for contact info under Cardiology/STEMI.      Signed, Laura Ingold, NP  06/08/2017, 7:56 AM    

## 2017-06-08 NOTE — H&P (View-Only) (Signed)
Progress Note  Patient Name: [name] Date of Encounter: 06/08/2017  Primary Cardiologist: Peter [place], MD   Subjective   No chest pain since 2 AM, has ntg paste.  Able to lie flat now.  Feels much better  Inpatient Medications    Scheduled Meds: . amLODipine  5 mg Oral Daily  . aspirin EC  81 mg Oral Daily  . fenofibrate  54 mg Oral Daily  . gabapentin  300 mg Oral QHS  . iopamidol      . pantoprazole  20 mg Oral Daily  . ramipril  2.5 mg Oral Daily  . zolpidem  10 mg Oral QHS   Continuous Infusions: . heparin 1,400 Units/hr (06/08/17 0132)   PRN Meds: acetaminophen, nitroGLYCERIN, ondansetron (ZOFRAN) IV   Vital Signs    Vitals:   06/08/17 0000 06/08/17 0030 06/08/17 0109 06/08/17 0449  BP: (!) 146/83 110/75 124/74 113/67  Pulse: 100 87 81 79  Resp: (!) 31 (!) 23  15  Temp:   97.8 F (36.6 C) (!) 97.5 F (36.4 C)  TempSrc:   Oral Oral  SpO2: 92% 92% 97% 90%  Weight: 230 lb (104.3 kg)  225 lb (102.1 kg)   Height: 6\' 2"  (1.88 m)  6\' 2"  (1.88 m)     Intake/Output Summary (Last 24 hours) at 06/08/2017 0756 Last data filed at 06/08/2017 0452 Gross per 24 hour  Intake -  Output 2750 ml  Net -2750 ml   Filed Weights   06/08/17 0000 06/08/17 0109  Weight: 230 lb (104.3 kg) 225 lb (102.1 kg)    Telemetry    SR with LBBB - Personally Reviewed  ECG    No new since admit - Personally Reviewed  Physical Exam   GEN: No acute distress.   Neck: No JVD Cardiac: RRR, no murmurs, rubs, or gallops.  Respiratory: Clear to diminished to auscultation bilaterally. GI: Soft, nontender, non-distended  MS: No edema; No deformity. Neuro:  Nonfocal  Psych: Normal affect   Labs    Chemistry Recent Labs  Lab 06/07/17 1733  NA 140  K 3.8  CL 107  CO2 22  GLUCOSE 159*  BUN 24*  CREATININE 1.22  CALCIUM 9.2  PROT 6.3*  ALBUMIN 3.9  AST 31  ALT 21  ALKPHOS 35*  BILITOT 0.7  GFRNONAA >60  GFRAA >60  ANIONGAP 11     Hematology Recent Labs  Lab  06/07/17 1733  WBC 4.7  RBC 4.69  HGB 14.3  HCT 43.9  MCV 93.6  MCH 30.5  MCHC 32.6  RDW 13.3  PLT 267    Cardiac Enzymes Recent Labs  Lab 06/07/17 1733 06/07/17 2134 06/08/17 0122  TROPONINI 0.11* 0.21* 0.28*   No results for input(s): TROPIPOC in the last 168 hours.   BNP Recent Labs  Lab 06/07/17 1733  BNP 177.1*     DDimer  Recent Labs  Lab 06/07/17 1959  DDIMER 1.88*     Radiology    Ct Angio Chest Pe W/cm &/or Wo Cm  Result Date: 06/07/2017 CLINICAL DATA:  Acute onset of generalized chest pain and dizziness. Productive cough. Elevated D-dimer. EXAM: CT ANGIOGRAPHY CHEST WITH CONTRAST TECHNIQUE: Multidetector CT imaging of the chest was performed using the standard protocol during bolus administration of intravenous contrast. Multiplanar CT image reconstructions and MIPs were obtained to evaluate the vascular anatomy. CONTRAST:  ISOVUE-370 IOPAMIDOL (ISOVUE-370) INJECTION 76% COMPARISON:  Chest radiograph performed earlier today at 5:31 p.m. FINDINGS: Cardiovascular:  There is no evidence of pulmonary embolus. The heart is borderline normal in size. Scattered coronary artery calcifications are seen. Mild calcification is noted at the aortic arch and proximal great vessels. Mediastinum/Nodes: No mediastinal lymphadenopathy is seen. No pericardial effusion is identified. The thyroid gland is unremarkable. No axillary lymphadenopathy is appreciated. Lungs/Pleura: Hazy bilateral airspace opacification is noted, with interstitial prominence, concerning for mild pulmonary edema. A few blebs are noted at the right lung base. No significant pleural effusion or pneumothorax is seen. Upper Abdomen: The visualized portions of the liver and spleen are unremarkable. The gallbladder is unremarkable in appearance. The visualized portions of the pancreas and right adrenal gland are within normal limits. Musculoskeletal: No acute osseous abnormalities are identified. Anterior  cervical spinal fusion hardware is partially imaged. The visualized musculature is unremarkable in appearance. Review of the MIP images confirms the above findings. IMPRESSION: 1. No evidence of pulmonary embolus. 2. Hazy bilateral airspace opacification, with interstitial prominence, concerning for mild pulmonary edema. 3. Few blebs at the right lung base. 4. Scattered coronary artery calcifications seen. Electronically Signed   By: Roanna Raider M.D.   On: 06/07/2017 22:54   Dg Chest Port 1 View  Result Date: 06/07/2017 CLINICAL DATA:  Hypoxia, shortness of Breath EXAM: PORTABLE CHEST 1 VIEW COMPARISON:  11/19/2014 FINDINGS: Mild cardiomegaly. Diffuse interstitial prominence throughout the lungs. Bibasilar atelectasis or scarring. No effusions or acute bony abnormality. IMPRESSION: Cardiomegaly. Diffuse interstitial prominence could reflect chronic interstitial changes or interstitial edema. Bibasilar atelectasis or scarring. Electronically Signed   By: Charlett Nose M.D.   On: 06/07/2017 17:47    Cardiac Studies   pending  Patient Profile     62 y.o. male with a past medical history of CAD status post familial hyperlipidemia on Repatha.  Has a history of multiple PCI in the past with the most recent one in 2016.  LVEF in 2016 was preserved and on a SPECT in 2018 was noted to drop to the 30s, now aadmitted with flash pulmonary edema.  Assessment & Plan    Flash pulmonary edema, last EF on nuc was 38% 02/2016 minimal ischemia and no significant change from 2012.    On Echo 11/20/14 EF was 50-55%.  Echo ordered for today  For cath today Neg 2750 since admit has rec'd total of 80 mg IV lasix.   The patient understands that risks included but are not limited to stroke (1 in 1000), death (1 in 1000), kidney failure [usually temporary] (1 in 500), bleeding (1 in 200), allergic reaction [possibly serious] (1 in 200). Pt and wife have agreed to proceed.   CAD with unstable angina --with last cath 2012  (prior stents to RCA in 2009) with non obstructive CAD in RCA, LAD and LCX (pt notes for last month at least at night he has indigestion at bedtime no matter when his last meal is and nothing really improved the discomfort.)   --tropoin 0.11; 0.21; 0.28  --NTG paste with relief of pain  HLD  On Repatha 08/2016 LDL was 18, TG 144 HDL 50  HTN controlled --will hold altace for cath today continue amlodipine   Elevated d dimer but neg CTA for PE.   LBBB chronic   For questions or updates, please contact [facility] Please consult [email or web address] for contact info under Cardiology/STEMI.      Signed, Nada Boozer, NP  06/08/2017, 7:56 AM

## 2017-06-08 NOTE — Progress Notes (Signed)
Patient going to cath lab unable to do echo at this time.

## 2017-06-08 NOTE — Progress Notes (Signed)
ANTICOAGULATION CONSULT NOTE  Pharmacy Consult for heparin Indication: chest pain/ACS  Allergies  Allergen Reactions  . Ezetimibe Other (See Comments)    Joint pain and drops BP  . Niacin Other (See Comments)    Drops BP and severe  . Statins Other (See Comments)    Severe joint pain and drops BP.    Patient Measurements: Height: 6\' 2"  (188 cm) Weight: 225 lb (102.1 kg) IBW/kg (Calculated) : 82.2 Heparin Dosing Weight: 103 kg  Vital Signs: Temp: 97.4 F (36.3 C) (06/04 0806) Temp Source: Oral (06/04 0806) BP: 116/77 (06/04 0806) Pulse Rate: 76 (06/04 0806)  Labs: Recent Labs    06/07/17 1733 06/07/17 2134 06/08/17 0122 06/08/17 0720  HGB 14.3  --   --  14.2  HCT 43.9  --   --  43.5  PLT 267  --   --  257  LABPROT  --   --   --  14.2  INR  --   --   --  1.11  HEPARINUNFRC  --   --   --  0.44  CREATININE 1.22  --   --  1.28*  TROPONINI 0.11* 0.21* 0.28* 0.21*    Estimated Creatinine Clearance: 75.4 mL/min (A) (by C-G formula based on SCr of 1.28 mg/dL (H)).   Medical History: Past Medical History:  Diagnosis Date  . Arthritis   . CHF (congestive heart failure) (HCC)   . Coronary artery disease   . Gout   . Hypercholesterolemia   . Hypertension   . Kidney stone    approx 5 years ago  . LBBB (left bundle branch block)   . MI (myocardial infarction) (HCC) 1993   LATERAL  . Pneumonia   . S/P coronary artery stent placement    RCA  . Varicose veins      Assessment: 62 yo man to start heparin for CP (noted with history of CAD. Last cath in 2016).  He was not on anticoagulation PTA. Plans noted for cath today -Heparin level is at goal  Goal of Therapy:  Heparin level 0.3-0.7 units/ml Monitor platelets by anticoagulation protocol: Yes   Plan:  -No heparin changes needed -Will follow plans post cath  Harland German, PharmD Clinical Pharmacist Clinical phone from 8:30-4:00 is [phone] After 4pm, please call [facility] [phone]) for  assistance. 06/08/2017 10:23 AM

## 2017-06-09 ENCOUNTER — Inpatient Hospital Stay (HOSPITAL_COMMUNITY): Payer: BLUE CROSS/BLUE SHIELD

## 2017-06-09 ENCOUNTER — Other Ambulatory Visit: Payer: Self-pay | Admitting: *Deleted

## 2017-06-09 ENCOUNTER — Encounter (HOSPITAL_COMMUNITY): Payer: Self-pay | Admitting: Cardiology

## 2017-06-09 DIAGNOSIS — I5043 Acute on chronic combined systolic (congestive) and diastolic (congestive) heart failure: Secondary | ICD-10-CM

## 2017-06-09 DIAGNOSIS — J810 Acute pulmonary edema: Secondary | ICD-10-CM

## 2017-06-09 DIAGNOSIS — I351 Nonrheumatic aortic (valve) insufficiency: Secondary | ICD-10-CM

## 2017-06-09 DIAGNOSIS — I2510 Atherosclerotic heart disease of native coronary artery without angina pectoris: Secondary | ICD-10-CM

## 2017-06-09 DIAGNOSIS — I25110 Atherosclerotic heart disease of native coronary artery with unstable angina pectoris: Secondary | ICD-10-CM

## 2017-06-09 DIAGNOSIS — I249 Acute ischemic heart disease, unspecified: Secondary | ICD-10-CM

## 2017-06-09 LAB — BASIC METABOLIC PANEL
Anion gap: 11 (ref 5–15)
BUN: 20 mg/dL (ref 6–20)
CO2: 26 mmol/L (ref 22–32)
Calcium: 8.8 mg/dL — ABNORMAL LOW (ref 8.9–10.3)
Chloride: 101 mmol/L (ref 101–111)
Creatinine, Ser: 1.25 mg/dL — ABNORMAL HIGH (ref 0.61–1.24)
GFR calc Af Amer: >60 mL/min (ref >60)
GFR calc non Af Amer: 60 mL/min — ABNORMAL LOW (ref >60)
Glucose, Bld: 86 mg/dL (ref 65–99)
Potassium: 3.9 mmol/L (ref 3.5–5.1)
Sodium: 138 mmol/L (ref 135–145)

## 2017-06-09 LAB — CBC
HCT: 43.4 % (ref 39.0–52.0)
Hemoglobin: 13.8 g/dL (ref 13.0–17.0)
MCH: 29.9 pg (ref 26.0–34.0)
MCHC: 31.8 g/dL (ref 30.0–36.0)
MCV: 94.1 fL (ref 78.0–100.0)
Platelets: 251 10*3/uL (ref 150–400)
RBC: 4.61 MIL/uL (ref 4.22–5.81)
RDW: 13.4 % (ref 11.5–15.5)
WBC: 6.2 10*3/uL (ref 4.0–10.5)

## 2017-06-09 LAB — ECHOCARDIOGRAM COMPLETE
Height: 74 in
Weight: 3568 oz

## 2017-06-09 LAB — HEPARIN LEVEL (UNFRACTIONATED): Heparin Unfractionated: 0.42 IU/mL (ref 0.30–0.70)

## 2017-06-09 MED ORDER — PERFLUTREN LIPID MICROSPHERE
INTRAVENOUS | Status: AC
  Filled 2017-06-09: qty 10

## 2017-06-09 MED ORDER — PERFLUTREN LIPID MICROSPHERE
1.0000 mL | INTRAVENOUS | Status: AC | PRN
  Administered 2017-06-09: 2 mL via INTRAVENOUS
  Filled 2017-06-09: qty 10

## 2017-06-09 MED ORDER — MORPHINE SULFATE (PF) 2 MG/ML IV SOLN
2.0000 mg | INTRAVENOUS | Status: DC | PRN
Start: 2017-06-09 — End: 2017-06-14

## 2017-06-09 NOTE — Progress Notes (Signed)
  Echocardiogram 2D Echocardiogram has been performed.  [name] 06/09/2017, 12:50 PM

## 2017-06-09 NOTE — Progress Notes (Signed)
Progress Note  Patient Name: [name] Date of Encounter: 06/09/2017  Primary Cardiologist: Peter Swaziland, MD   Subjective   No chest pain/pressure. He has had brief intermittent shortness of breath not related to activity. No orthopnea  Inpatient Medications    Scheduled Meds: . aspirin EC  81 mg Oral Daily  . carvedilol  3.125 mg Oral BID WC  . fenofibrate  54 mg Oral Daily  . furosemide  40 mg Oral Daily  . gabapentin  300 mg Oral QHS  . isosorbide mononitrate  15 mg Oral Daily  . pantoprazole  20 mg Oral Daily  . sodium chloride flush  3 mL Intravenous Q12H  . zolpidem  10 mg Oral QHS   Continuous Infusions: . sodium chloride    . heparin 1,400 Units/hr (06/09/17 0655)   PRN Meds: sodium chloride, acetaminophen, nitroGLYCERIN, ondansetron (ZOFRAN) IV, sodium chloride flush   Vital Signs    Vitals:   06/08/17 1800 06/08/17 2034 06/08/17 2343 06/09/17 0518  BP: (!) 120/58 114/72 (!) 97/43 117/88  Pulse: 76 80 77 77  Resp: (!) 8 11 15 17   Temp:  98.4 F (36.9 C) 97.7 F (36.5 C) 97.7 F (36.5 C)  TempSrc:  Oral Oral Oral  SpO2: 93% 94% 93% 99%  Weight:    223 lb (101.2 kg)  Height:        Intake/Output Summary (Last 24 hours) at 06/09/2017 0741 Last data filed at 06/09/2017 0655 Gross per 24 hour  Intake 924.93 ml  Output -  Net 924.93 ml   Filed Weights   06/08/17 0000 06/08/17 0109 06/09/17 0518  Weight: 230 lb (104.3 kg) 225 lb (102.1 kg) 223 lb (101.2 kg)    Telemetry    Sinus rhythm in the 70's, up to 100 with activity, occ PACs - Personally Reviewed  ECG    No new tracings - Personally Reviewed  Physical Exam   GEN: No acute distress.   Neck: No JVD Cardiac: RRR, no murmurs, rubs, or gallops.  Respiratory: Clear to auscultation bilaterally. GI: Soft, nontender, non-distended  MS: No edema; No deformity. Neuro:  Nonfocal  Psych: Normal affect   Labs    Chemistry Recent Labs  Lab 06/07/17 1733 06/08/17 0720  NA 140 141  K  3.8 3.5  CL 107 102  CO2 22 28  GLUCOSE 159* 102*  BUN 24* 22*  CREATININE 1.22 1.28*  CALCIUM 9.2 9.0  PROT 6.3*  --   ALBUMIN 3.9  --   AST 31  --   ALT 21  --   ALKPHOS 35*  --   BILITOT 0.7  --   GFRNONAA >60 58*  GFRAA >60 >60  ANIONGAP 11 11     Hematology Recent Labs  Lab 06/07/17 1733 06/08/17 0720 06/09/17 0421  WBC 4.7 7.3 6.2  RBC 4.69 4.68 4.61  HGB 14.3 14.2 13.8  HCT 43.9 43.5 43.4  MCV 93.6 92.9 94.1  MCH 30.5 30.3 29.9  MCHC 32.6 32.6 31.8  RDW 13.3 13.5 13.4  PLT 267 257 251    Cardiac Enzymes Recent Labs  Lab 06/07/17 2134 06/08/17 0122 06/08/17 0720 06/08/17 1100  TROPONINI 0.21* 0.28* 0.21* 0.22*   No results for input(s): TROPIPOC in the last 168 hours.   BNP Recent Labs  Lab 06/07/17 1733  BNP 177.1*     DDimer  Recent Labs  Lab 06/07/17 1959  DDIMER 1.88*     Radiology    Ct Angio Chest  Pe W/cm &/or Wo Cm  Result Date: 06/07/2017 CLINICAL DATA:  Acute onset of generalized chest pain and dizziness. Productive cough. Elevated D-dimer. EXAM: CT ANGIOGRAPHY CHEST WITH CONTRAST TECHNIQUE: Multidetector CT imaging of the chest was performed using the standard protocol during bolus administration of intravenous contrast. Multiplanar CT image reconstructions and MIPs were obtained to evaluate the vascular anatomy. CONTRAST:  ISOVUE-370 IOPAMIDOL (ISOVUE-370) INJECTION 76% COMPARISON:  Chest radiograph performed earlier today at 5:31 p.m. FINDINGS: Cardiovascular:  There is no evidence of pulmonary embolus. The heart is borderline normal in size. Scattered coronary artery calcifications are seen. Mild calcification is noted at the aortic arch and proximal great vessels. Mediastinum/Nodes: No mediastinal lymphadenopathy is seen. No pericardial effusion is identified. The thyroid gland is unremarkable. No axillary lymphadenopathy is appreciated. Lungs/Pleura: Hazy bilateral airspace opacification is noted, with interstitial prominence,  concerning for mild pulmonary edema. A few blebs are noted at the right lung base. No significant pleural effusion or pneumothorax is seen. Upper Abdomen: The visualized portions of the liver and spleen are unremarkable. The gallbladder is unremarkable in appearance. The visualized portions of the pancreas and right adrenal gland are within normal limits. Musculoskeletal: No acute osseous abnormalities are identified. Anterior cervical spinal fusion hardware is partially imaged. The visualized musculature is unremarkable in appearance. Review of the MIP images confirms the above findings. IMPRESSION: 1. No evidence of pulmonary embolus. 2. Hazy bilateral airspace opacification, with interstitial prominence, concerning for mild pulmonary edema. 3. Few blebs at the right lung base. 4. Scattered coronary artery calcifications seen. Electronically Signed   By: Roanna Raider M.D.   On: 06/07/2017 22:54   Dg Chest Port 1 View  Result Date: 06/07/2017 CLINICAL DATA:  Hypoxia, shortness of Breath EXAM: PORTABLE CHEST 1 VIEW COMPARISON:  11/19/2014 FINDINGS: Mild cardiomegaly. Diffuse interstitial prominence throughout the lungs. Bibasilar atelectasis or scarring. No effusions or acute bony abnormality. IMPRESSION: Cardiomegaly. Diffuse interstitial prominence could reflect chronic interstitial changes or interstitial edema. Bibasilar atelectasis or scarring. Electronically Signed   By: Charlett Nose M.D.   On: 06/07/2017 17:47    Cardiac Studies   RIGHT/LEFT HEART CATH AND CORONARY ANGIOGRAPHY 06/08/17  Conclusion    Prox RCA lesion is 70% stenosed.  Mid RCA lesion is 50% stenosed.  Previously placed Mid RCA to Dist RCA stent (unknown type) is widely patent.  Previously placed Dist RCA stent (unknown type) is widely patent.  Post Atrio lesion is 50% stenosed.  Ost LAD to Prox LAD lesion is 99% stenosed.  Ost 1st Diag to 1st Diag lesion is 95% stenosed.  Ost Ramus to Ramus lesion is 80%  stenosed.  Ost 1st Mrg lesion is 75% stenosed.  There is severe left ventricular systolic dysfunction.  LV end diastolic pressure is normal.  The left ventricular ejection fraction is less than 25% by visual estimate.  LV end diastolic pressure is normal.   1. Severe 3 vessel obstructive CAD    -99% ostial LAD with TIMI 1 flow. The vessel fills antegrade to the second diagonal. There are good right to left collaterals to the mid to distal LAD. The vessel appears large.    - 95% proximal first diagonal    - 80% proximal ramus intermediate    - 75% large OM1    - 70% proximal RCA 2. Severe LV dysfunction. EF estimated at 20%. 3. Low LVEDP and PCWP c/w effective diuresis 4. Normal right heart pressures 5. Normal cardiac output. Index 3.28.   Plan: Recommend CT  surgery consult for revascularization- message sent. Echo pending. The ostial LAD is not suitable for PCI. Optimize medical therapy for CHF. Will discontinue amlodipine. Start Coreg. Consider Entresto and aldactone. If pacemaker/ICD needed in the future would recommend using a left subclavian approach.     Diagnostic Diagram          Patient Profile     62 y.o. male with CAD s/p multiple PCIs, familial hyperlipidemia on Repatha, and chronic systolic and diastolic heart failure here with NSTEMI and hypertensive urgency. Troponin 0.28. Cath on 06/08/17 showed multivessel CAD. CTS surgery consult.  Assessment & Plan    NSTEMI -Troponin 0.28 -LHC done yesterday revealed severe 3 vessel obstructive CAD and Severe LV dysfunction with EF estimated at 20%. (see above report) -CVTS has been consulted (message sent by Dr. Swaziland) for possible surgical revascularization.  --IV heparin infusing -No chest discomfort, but still has occasional brief shortness of breath not related to activity.   Acute systolic heart failure -In setting of hypertensive urgency and multivessel CAD -EF estimated to be 20% by cath yesterday.  Echocardiogram pending. This is worsened from prior of 38% on nuc in 02/2016 and normal LVEF in 2016.  -BNP was only mildly elevated -Pt was diuresed with lasix 40 mg IV X2 with improvement in breathing.  -No orthpnea or DOE. He has mild occ brief shortness of breath likely related to myocardial ischemia. He appears euvolemic.  -RHC done yesterday showed Low LVEDP and PCWP c/w effective diuresis, Normal right heart pressures, Normal cardiac output. Index 3.28.  -Carvedilol 3.125 mg bid has been added. Plan to use Entresto and add spironolactone at some point. Will discuss timing with Dr. [name]. His Ramipril was held for cath. ?resume ramipril vs start Entresto.   Hypertension -Initial SBP 180 despite taking all medications as prescribed.  -Amlodipine has been stopped and coreg initiated for heart failure.  -BP has been well controlled and soft at times yesterday. Mildly elevated to today at 139/91 -As above, ?resume low dose ramipril with his soft BP vs start Entresto. Pt reports having trouble tolerating meds in the past.   Hyperlipidemia -On PCSK9 inhibitor, Repatha. LDL in 08/2016 was 18. Very well controlled.   LBBB chronic   For questions or updates, please contact [facility] Please consult [email or web address] for contact info under Cardiology/STEMI.      Signed, Berton Bon, NP  06/09/2017, 7:41 AM

## 2017-06-09 NOTE — Progress Notes (Signed)
CARDIAC REHAB PHASE I   PRE:  Rate/Rhythm: 82 SR BBB  BP:  Supine:   Sitting: 143/62  Standing:    SaO2: 98 RA  MODE:  Ambulation:  ft   POST:  Rate/Rhythm:   BP:  Supine:   Sitting:   Standing:    SaO2:  1425-1510  On arrival after taking pt's VS he states that he is having some mild chest discomfort. He states that it is 1-2 on the pain scale. He also c/o of headache and of some back discomfort. I informed his RN. Pt has going for heart surgery booklet and states that he has been reading it. I gave him pt care guide for heart surgery, We discussed sternal precautions, use of IS and walking post op. He voices understanding. He has been informed that there is a pre-op surgery video that he can watch. I will not ambulate him since he is having chest discomfort.  Melina Copa RN 06/09/2017 3:24 PM

## 2017-06-09 NOTE — Consult Note (Addendum)
[address]       [place] [address]             [phone]        [name] [facility] Medical Record #[id number] Date of Birth: 1955/12/08  Referring: No ref. provider found Primary Care: Jarome Matin, MD Primary Cardiologist:Peter Swaziland, MD  Chief Complaint:    Chief Complaint  Patient presents with  . Chest Pain    History of Present Illness:    The patient is a 62 year old male who presented to the emergency department on 06/07/2017 with chief complaint of flash pulmonary edema.  The patient has a previous history of coronary artery disease as well as familial hyperlipidemia on Repatha.  He has had multiple PCI in the past with most recent in 2016.  LVH in 2016 was preserved and on a SPECT scan in 2018 he was noted to have dropped into the 30 percentage.  On the date of admission he presented with acute onset of shortness of breath with cough.  He described a productive cough including yellow-tinged phlegm.  EMS was called and he was noted to be hypertensive with systolic in the 190s.  Upon presentation to the emergency department initial chest x-ray showed interstitial pulmonary edema.  CT scan for pulmonary embolism was negative.  He initially got a good response to diuretics and oxygen and blood pressure returned to normal.  He was felt to require admission for further evaluation and treatment to include cardiology consultation. He did rule in for non-STEMI.  Peak troponin I is 0.28.  He was felt to be a candidate for cardiac catheterization this revealed severe three-vessel coronary artery disease.  Please see the detailed report below.  We are asked to see the patient in cardiothoracic surgical consultation for consideration of coronary artery surgical revascularization.  Ejection fraction is noted to be severely impaired  at 20% on catheterization.  An echocardiogram was obtained today but the result is currently pending to the chart.  He does have a  history of significant tobacco abuse of 2 packs/day but did quit approximately 18 years ago.   Current Activity/ Functional Status: Patient is independent with mobility/ambulation, transfers, ADL's, IADL's.   Zubrod Score: At the time of surgery this patient's most appropriate activity status/level should be described as: []     0    Normal activity, no symptoms [x]     1    Restricted in physical strenuous activity but ambulatory, able to do out light work []     2    Ambulatory and capable of self care, unable to do work activities, up and about                 more than 50%  Of the time                            []     3    Only limited self care, in bed greater than 50% of waking hours []     4    Completely disabled, no self care, confined to bed or chair []     5    Moribund  Past Medical History:  Diagnosis Date  . Arthritis   . CHF (congestive heart failure) (HCC)   . Coronary artery disease   . Gout   . Hypercholesterolemia   . Hypertension   . Kidney stone    approx 5  years ago  . LBBB (left bundle branch block)   . MI (myocardial infarction) (HCC) 1993   LATERAL  . Pneumonia   . S/P coronary artery stent placement    RCA  . Varicose veins     Past Surgical History:  Procedure Laterality Date  . ANTERIOR CERVICAL DECOMP/DISCECTOMY FUSION  03/03/2011   Procedure: ANTERIOR CERVICAL DECOMPRESSION/DISCECTOMY FUSION 3 LEVELS;  Surgeon: Karn Cassis, MD;  Location: [facility]UR[facility];  Service: Neurosurgery;  Laterality: N/A;  Cervical three-four Cervical four-five Cervical five-six Anterior cervical decompression/diskectomy, fusion  . CARDIAC CATHETERIZATION  2009   Stents in RCA patent. 70 to 80% PL, and 50% ostial PD.   Marland Kitchen CARDIAC CATHETERIZATION N/A 11/20/2014   Procedure: Left Heart Cath and Coronary Angiography;  Surgeon: Peter M Swaziland, MD;  Location: [facility] INVASIVE CV LAB;  Service: Cardiovascular;  Laterality: N/A;  . CORONARY ANGIOPLASTY     DIRECT ANGIOPLASTY THE  MARGINAL BRANCH  . CORONARY STENT PLACEMENT     RCA  . LUMBAR DISC SURGERY    . RIGHT/LEFT HEART CATH AND CORONARY ANGIOGRAPHY N/A 06/08/2017   Procedure: RIGHT/LEFT HEART CATH AND CORONARY ANGIOGRAPHY;  Surgeon: Swaziland, Peter M, MD;  Location: [facility] INVASIVE CV LAB;  Service: Cardiovascular;  Laterality: N/A;    Social History   Tobacco Use  Smoking Status Former Smoker  . Packs/day: 2.00  . Years: 20.00  . Pack years: 40.00  . Types: Cigarettes  . Last attempt to quit: 05/07/1999  . Years since quitting: 18.1  Smokeless Tobacco Never Used    Social History   Substance and Sexual Activity  Alcohol Use Yes  . Alcohol/week: 3.6 oz  . Types: 6 Cans of beer per week   Comment: occasional     Allergies  Allergen Reactions  . Ezetimibe Other (See Comments)    Joint pain and drops BP  . Niacin Other (See Comments)    Drops BP and severe  . Statins Other (See Comments)    Severe joint pain and drops BP.    Current Facility-Administered Medications  Medication Dose Route Frequency Provider Last Rate Last Dose  . PERFLUTREN LIPID MICROSPHERE injection SUSP           . 0.9 %  sodium chloride infusion  250 mL Intravenous PRN Swaziland, Peter M, MD      . acetaminophen (TYLENOL) tablet 650 mg  650 mg Oral Q4H PRN Swaziland, Peter M, MD   650 mg at 06/08/17 0902  . aspirin EC tablet 81 mg  81 mg Oral Daily Swaziland, Peter M, MD   81 mg at 06/09/17 1009  . carvedilol (COREG) tablet 3.125 mg  3.125 mg Oral BID WC Swaziland, Peter M, MD   3.125 mg at 06/09/17 1009  . fenofibrate tablet 54 mg  54 mg Oral Daily Swaziland, Peter M, MD   54 mg at 06/09/17 1018  . furosemide (LASIX) tablet 40 mg  40 mg Oral Daily Swaziland, Peter M, MD   40 mg at 06/09/17 1009  . gabapentin (NEURONTIN) capsule 300 mg  300 mg Oral QHS Swaziland, Peter M, MD   300 mg at 06/08/17 2214  . heparin ADULT infusion 100 units/mL (25000 units/26mL sodium chloride 0.45%)  1,400 Units/hr Intravenous Continuous Croitoru, Mihai, MD 14 mL/hr at  06/09/17 0655 1,400 Units/hr at 06/09/17 0655  . isosorbide mononitrate (IMDUR) 24 hr tablet 15 mg  15 mg Oral Daily Swaziland, Peter M, MD   15 mg at 06/09/17 1008  .  nitroGLYCERIN (NITROSTAT) SL tablet 0.4 mg  0.4 mg Sublingual Q5 Min x 3 PRN Swaziland, Peter M, MD      . ondansetron [facility]) injection 4 mg  4 mg Intravenous Q6H PRN Swaziland, Peter M, MD      . pantoprazole (PROTONIX) EC tablet 20 mg  20 mg Oral Daily Swaziland, Peter M, MD   20 mg at 06/09/17 1009  . perflutren lipid microspheres (DEFINITY) IV suspension  1-10 mL Intravenous PRN Swaziland, Peter M, MD   2 mL at 06/09/17 1233  . sodium chloride flush (NS) 0.9 % injection 3 mL  3 mL Intravenous Q12H Swaziland, Peter M, MD      . sodium chloride flush (NS) 0.9 % injection 3 mL  3 mL Intravenous PRN Swazil[place], Peter M, MD      . zolpidem Remus Loffler) tablet 10 mg  10 mg Oral QHS Swaziland, Peter M, MD   10 mg at 06/08/17 2214    Medications Prior to Admission  Medication Sig Dispense Refill Last Dose  . amLODipine (NORVASC) 5 MG tablet Take 1 tablet (5 mg total) by mouth daily. KEEP OV. 90 tablet 0 06/07/2017 at Unknown time  . aspirin 81 MG tablet Take 81 mg by mouth daily.     06/07/2017 at Unknown time  . fenofibrate (TRICOR) 145 MG tablet TAKE 1 TABLET BY MOUTH EVERY DAY 90 tablet 3 06/07/2017 at Unknown time  . gabapentin (NEURONTIN) 300 MG capsule Take 300 mg by mouth at bedtime.  2 06/06/2017 at Unknown time  . Multiple Vitamins-Minerals (MENS MULTIVITAMIN PLUS PO) Take 1 tablet by mouth 2 (two) times daily.   06/07/2017 at Unknown time  . ramipril (ALTACE) 2.5 MG capsule Take 1 capsule (2.5 mg total) by mouth daily. KEEP OV. 90 capsule 0 06/07/2017 at Unknown time  . REPATHA SURECLICK 140 MG/ML SOAJ INJECT 140MG  (1 PEN) UNDER THE SKIN EVERY OTHER WEEK 6 mL 3 06/03/2017 at unk  . zolpidem (AMBIEN) 10 MG tablet Take 10 mg by mouth at bedtime.   06/06/2017 at Unknown time    Family History  Problem Relation Age of Onset  . Heart attack Father   . Diabetes  Father   . Breast cancer Mother   . Coronary artery disease Mother   . Heart disease Brother   . Heart attack Maternal Grandfather      Review of Systems:  Review of Systems  Constitutional: Positive for malaise/fatigue. Negative for chills, diaphoresis, fever and weight loss.  HENT: Positive for congestion and sore throat. Negative for ear discharge, ear pain, hearing loss, nosebleeds, sinus pain and tinnitus.   Eyes: Negative.   Respiratory: Positive for cough, sputum production, shortness of breath and wheezing. Negative for hemoptysis and stridor.   Cardiovascular: Positive for chest pain, claudication and leg swelling. Negative for palpitations, orthopnea and PND.  Gastrointestinal: Positive for heartburn and nausea. Negative for abdominal pain, blood in stool, constipation, diarrhea, melena and vomiting.  Genitourinary: Positive for urgency. Negative for dysuria, flank pain, frequency and hematuria.  Musculoskeletal: Positive for back pain, myalgias and neck pain. Negative for falls and joint pain.  Skin: Negative for itching and rash.  Neurological: Positive for dizziness. Negative for tingling, tremors, sensory change, speech change, focal weakness, seizures, loss of consciousness, weakness and headaches.  Endo/Heme/Allergies: Positive for environmental allergies and polydipsia. Bruises/bleeds easily.  Psychiatric/Behavioral: Negative for depression, hallucinations, memory loss, substance abuse and suicidal ideas. The patient has insomnia. The patient is not nervous/anxious.  Physical Exam: BP 93/67 (BP Location: Right Arm)   Pulse 75   Temp 98.2 F (36.8 C) (Oral)   Resp 17   Ht  (1.88 m)   Wt 101.2 kg (223 lb)   SpO2 95%   BMI 28.63 kg/m   Physical Exam    Diagnostic Studies & Laboratory data:     Recent Radiology Findings:   Ct Angio Chest Pe W/cm &/or Wo Cm  Result Date: 06/07/2017 CLINICAL DATA:  Acute onset of generalized chest pain and dizziness.  Productive cough. Elevated D-dimer. EXAM: CT ANGIOGRAPHY CHEST WITH CONTRAST TECHNIQUE: Multidetector CT imaging of the chest was performed using the standard protocol during bolus administration of intravenous contrast. Multiplanar CT image reconstructions and MIPs were obtained to evaluate the vascular anatomy. CONTRAST:  ISOVUE-370 IOPAMIDOL (ISOVUE-370) INJECTION 76% COMPARISON:  Chest radiograph performed earlier today at 5:31 p.m. FINDINGS: Cardiovascular:  There is no evidence of pulmonary embolus. The heart is borderline normal in size. Scattered coronary artery calcifications are seen. Mild calcification is noted at the aortic arch and proximal great vessels. Mediastinum/Nodes: No mediastinal lymphadenopathy is seen. No pericardial effusion is identified. The thyroid gland is unremarkable. No axillary lymphadenopathy is appreciated. Lungs/Pleura: Hazy bilateral airspace opacification is noted, with interstitial prominence, concerning for mild pulmonary edema. A few blebs are noted at the right lung base. No significant pleural effusion or pneumothorax is seen. Upper Abdomen: The visualized portions of the liver and spleen are unremarkable. The gallbladder is unremarkable in appearance. The visualized portions of the pancreas and right adrenal gland are within normal limits. Musculoskeletal: No acute osseous abnormalities are identified. Anterior cervical spinal fusion hardware is partially imaged. The visualized musculature is unremarkable in appearance. Review of the MIP images confirms the above findings. IMPRESSION: 1. No evidence of pulmonary embolus. 2. Hazy bilateral airspace opacification, with interstitial prominence, concerning for mild pulmonary edema. 3. Few blebs at the right lung base. 4. Scattered coronary artery calcifications seen. Electronically Signed   By: Roanna Raider M.D.   On: 06/07/2017 22:54   Dg Chest Port 1 View  Result Date: 06/07/2017 CLINICAL DATA:  Hypoxia, shortness  of Breath EXAM: PORTABLE CHEST 1 VIEW COMPARISON:  11/19/2014 FINDINGS: Mild cardiomegaly. Diffuse interstitial prominence throughout the lungs. Bibasilar atelectasis or scarring. No effusions or acute bony abnormality. IMPRESSION: Cardiomegaly. Diffuse interstitial prominence could reflect chronic interstitial changes or interstitial edema. Bibasilar atelectasis or scarring. Electronically Signed   By: Charlett Nose M.D.   On: 06/07/2017 17:47    Procedures   RIGHT/LEFT HEART CATH AND CORONARY ANGIOGRAPHY  Conclusion     Prox RCA lesion is 70% stenosed.  Mid RCA lesion is 50% stenosed.  Previously placed Mid RCA to Dist RCA stent (unknown type) is widely patent.  Previously placed Dist RCA stent (unknown type) is widely patent.  Post Atrio lesion is 50% stenosed.  Ost LAD to Prox LAD lesion is 99% stenosed.  Ost 1st Diag to 1st Diag lesion is 95% stenosed.  Ost Ramus to Ramus lesion is 80% stenosed.  Ost 1st Mrg lesion is 75% stenosed.  There is severe left ventricular systolic dysfunction.  LV end diastolic pressure is normal.  The left ventricular ejection fraction is less than 25% by visual estimate.  LV end diastolic pressure is normal.   1. Severe 3 vessel obstructive CAD    -99% ostial LAD with TIMI 1 flow. The vessel fills antegrade to the second diagonal. There are good right to left collaterals to the  mid to distal LAD. The vessel appears large.    - 95% proximal first diagonal    - 80% proximal ramus intermediate    - 75% large OM1    - 70% proximal RCA 2. Severe LV dysfunction. EF estimated at 20%. 3. Low LVEDP and PCWP c/w effective diuresis 4. Normal right heart pressures 5. Normal cardiac output. Index 3.28.   Plan: Recommend CT surgery consult for revascularization- message sent. Echo pending. The ostial LAD is not suitable for PCI. Optimize medical therapy for CHF. Will discontinue amlodipine. Start Coreg. Consider Entresto and aldactone. If  pacemaker/ICD needed in the future would recommend using a left subclavian approach.    Indications   Non-ST elevation (NSTEMI) myocardial infarction (HCC) [I21.4 (ICD-10-CM)]  Procedural Details/Technique   Technical Details Indication: 62 yo WM with remote history of stents in the RCA. Presents with acute pulmonary edema and NSTEMI  Procedural Details: The right wrist was prepped, draped, and anesthetized with 1% lidocaine. Using the modified Seldinger technique a 6 Fr slender sheath was placed in the right radial artery and a 5 French sheath was placed in the right brachial vein. 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 We switched to a right femoral venous access using a modified Seldinger approach. A Swan-Ganz catheter was used for the right heart catheterization. Standard protocol was followed for recording of right heart pressures and sampling of oxygen saturations. Fick cardiac output was calculated. Standard Judkins catheters were used for selective coronary angiography and left ventriculography. There were no immediate procedural complications. The patient was transferred to the post catheterization recovery area for further monitoring. Contrast: 140 cc   Estimated blood loss <50 mL.  During this procedure the patient was administered the following to achieve and maintain moderate conscious sedation: Versed 3 mg, Fentanyl 50 mcg, while the patient's heart rate, blood pressure, and oxygen saturation were continuously monitored. The period of conscious sedation was 76 minutes, of which I was present face-to-face 100% of this time.  Complications   Complications documented before study signed (06/08/2017 5:43 PM EDT)    No complications were associated with this study.  Documented by Swaziland, Peter M, MD - 06/08/2017 5:36 PM EDT    Coronary Findings   Diagnostic  Dominance: Right  Left Anterior Descending   Collaterals  Mid LAD filled by collaterals from Post Atrio.    Ost LAD to Prox LAD lesion 99% stenosed  Ost LAD to Prox LAD lesion is 99% stenosed. with right-to-left collateral flow. The lesion is severely calcified. TIMI grade 1 flow  First Diagonal Branch  Ost 1st Diag to 1st Diag lesion 95% stenosed  Ost 1st Diag to 1st Diag lesion is 95% stenosed.  Second Septal Branch  Collaterals  2nd Sept filled by collaterals from RPDA.    Ramus Intermedius  Ost Ramus to Ramus lesion 80% stenosed  Ost Ramus to Ramus lesion is 80% stenosed.  Left Circumflex  First Obtuse Marginal Branch  Ost 1st Mrg lesion 75% stenosed  Ost 1st Mrg lesion is 75% stenosed.  Right Coronary Artery  Prox RCA lesion 70% stenosed  Prox RCA lesion is 70% stenosed.  Mid RCA lesion 50% stenosed  Mid RCA lesion is 50% stenosed.  Mid RCA to Dist RCA lesion 0% stenosed  Previously placed Mid RCA to Dist RCA stent (unknown type) is widely patent.  Dist RCA lesion 0% stenosed  Previously placed Dist RCA  stent (unknown type) is widely patent.  Right Posterior Atrioventricular Branch  Post Atrio lesion 50% stenosed  Post Atrio lesion is 50% stenosed. The lesion was previously treated using a drug eluting stent over 2 years ago.  Intervention   No interventions have been documented.  Right Heart   Right Heart Pressures LV EDP is normal.  Wall Motion              Left Heart   Left Ventricle The left ventricle is moderately dilated. There is severe left ventricular systolic dysfunction. LV end diastolic pressure is normal. The left ventricular ejection fraction is less than 25% by visual estimate. There are LV function abnormalities due to global hypokinesis.  Coronary Diagrams   Diagnostic Diagram         I have independently reviewed the above radiologic studies and discussed with the patient   Recent Lab Findings: Lab Results  Component Value Date   WBC 6.2 06/09/2017   HGB 13.8 06/09/2017    HCT 43.4 06/09/2017   PLT 251 06/09/2017   GLUCOSE 102 (H) 06/08/2017   CHOL 97 (L) 08/07/2016   TRIG 144 08/07/2016   HDL 50 08/07/2016   LDLCALC 18 08/07/2016   ALT 21 06/07/2017   AST 31 06/07/2017   NA 141 06/08/2017   K 3.5 06/08/2017   CL 102 06/08/2017   CREATININE 1.28 (H) 06/08/2017   BUN 22 (H) 06/08/2017   CO2 28 06/08/2017   TSH 2.062 11/19/2014   INR 1.11 06/08/2017   HGBA1C 6.3 (H) 11/19/2014      Assessment / Plan: The patient is a 62 year old male with severe three-vessel coronary artery disease and non-STEMI on presentation.  He has chronic and acute diastolic dysfunction and most recent EF on catheterization is 20%.  The echocardiogram report is currently pending.  His studies will be evaluated by the surgeon and determination of surgical eligibility will be made.     Rowe Clack, PA-C 06/09/2017 12:38 PM  Patient examined, coronary angiograms and echocardiogram images personally reviewed and counseled with patient.  Patient presented with acute respiratory distress, pulmonary edema and positive cardiac enzymes /non-STEMI.  He was treated with diuresis and underwent cardiac catheterization showing chronic occlusion of the LAD, high-grade stenosis of the RCA, high-grade stenosis of a diagonal branch of the LAD and ramus intermediate.  Circumflex was small nondominant.  Echocardiogram showed EF 20-25% without significant valvular disease.  Patient currently stable on IV heparin in sinus rhythm.  The patient's cardiologist has recommended multivessel CABG as his best long-term therapy.  I agree with that recommendation and discussed the procedure of CABG in detail with the patient and family.  Surgery will be scheduled at the first available OR date which is Monday, June 10.

## 2017-06-09 NOTE — Progress Notes (Signed)
ANTICOAGULATION CONSULT NOTE - Follow Up Consult  Pharmacy Consult for Heparin Indication: 3VCAD  Allergies  Allergen Reactions  . Ezetimibe Other (See Comments)    Joint pain and drops BP  . Niacin Other (See Comments)    Drops BP and severe  . Statins Other (See Comments)    Severe joint pain and drops BP.    Patient Measurements: Height: 6\' 2"  (188 cm) Weight: 223 lb (101.2 kg) IBW/kg (Calculated) : 82.2 Heparin Dosing Weight:  101.2 kg  Vital Signs: Temp: 98 F (36.7 C) (06/05 0748) Temp Source: Oral (06/05 0748) BP: 139/91 (06/05 0748) Pulse Rate: 82 (06/05 0748)  Labs: Recent Labs    06/07/17 1733  06/08/17 0122 06/08/17 0720 06/08/17 1100 06/09/17 0421 06/09/17 0803  HGB 14.3  --   --  14.2  --  13.8  --   HCT 43.9  --   --  43.5  --  43.4  --   PLT 267  --   --  257  --  251  --   LABPROT  --   --   --  14.2  --   --   --   INR  --   --   --  1.11  --   --   --   HEPARINUNFRC  --   --   --  0.44  --   --  0.42  CREATININE 1.22  --   --  1.28*  --   --   --   TROPONINI 0.11*   < > 0.28* 0.21* 0.22*  --   --    < > = values in this interval not displayed.    Estimated Creatinine Clearance: 75 mL/min (A) (by C-G formula based on SCr of 1.28 mg/dL (H)).   Assessment:  Anticoag: heparin for r/o ACS. Cath 6/4 with severe 3VCAD, severe LV dysfunction. Heparin level 0.42 in goal. CBC WNL  Goal of Therapy:  Heparin level 0.3-0.7 units/ml Monitor platelets by anticoagulation protocol: Yes   Plan:  -Continue heparin at 1400 units/hr resumed post-cath on 6/5 -daily HL/CBC -echo ordered  Kimara Bencomo S. Merilynn Finland, PharmD, BCPS Clinical Staff Pharmacist Pager [phone]  Misty Stanley Stillinger 06/09/2017,9:49 AM

## 2017-06-09 NOTE — Progress Notes (Signed)
The patient has been given an education book on cardiac surgery and viewed the pre/post surgery education videos. This RN answered any questions the patient had at this time.  Sheppard Evens RN

## 2017-06-10 ENCOUNTER — Inpatient Hospital Stay (HOSPITAL_COMMUNITY): Payer: BLUE CROSS/BLUE SHIELD

## 2017-06-10 LAB — TSH: TSH: 3.983 u[IU]/mL (ref 0.350–4.500)

## 2017-06-10 LAB — COMPREHENSIVE METABOLIC PANEL
ALT: 19 U/L (ref 17–63)
AST: 22 U/L (ref 15–41)
Albumin: 3.6 g/dL (ref 3.5–5.0)
Alkaline Phosphatase: 28 U/L — ABNORMAL LOW (ref 38–126)
Anion gap: 7 (ref 5–15)
BUN: 16 mg/dL (ref 6–20)
CO2: 25 mmol/L (ref 22–32)
Calcium: 9 mg/dL (ref 8.9–10.3)
Chloride: 107 mmol/L (ref 101–111)
Creatinine, Ser: 1.14 mg/dL (ref 0.61–1.24)
GFR calc Af Amer: >60 mL/min (ref >60)
GFR calc non Af Amer: >60 mL/min (ref >60)
Glucose, Bld: 95 mg/dL (ref 65–99)
Potassium: 3.8 mmol/L (ref 3.5–5.1)
Sodium: 139 mmol/L (ref 135–145)
Total Bilirubin: 0.9 mg/dL (ref 0.3–1.2)
Total Protein: 6.2 g/dL — ABNORMAL LOW (ref 6.5–8.1)

## 2017-06-10 LAB — CBC
HCT: 43.2 % (ref 39.0–52.0)
Hemoglobin: 13.9 g/dL (ref 13.0–17.0)
MCH: 30.5 pg (ref 26.0–34.0)
MCHC: 32.2 g/dL (ref 30.0–36.0)
MCV: 94.9 fL (ref 78.0–100.0)
Platelets: 246 10*3/uL (ref 150–400)
RBC: 4.55 MIL/uL (ref 4.22–5.81)
RDW: 13.3 % (ref 11.5–15.5)
WBC: 4.9 10*3/uL (ref 4.0–10.5)

## 2017-06-10 LAB — HEMOGLOBIN A1C
Hgb A1c MFr Bld: 5.9 % — ABNORMAL HIGH (ref 4.8–5.6)
Mean Plasma Glucose: 122.63 mg/dL

## 2017-06-10 LAB — LIPID PANEL
Cholesterol: 85 mg/dL (ref 0–200)
HDL: 42 mg/dL (ref >40)
LDL Cholesterol: 25 mg/dL (ref 0–99)
Total CHOL/HDL Ratio: 2 RATIO
Triglycerides: 89 mg/dL (ref <150)
VLDL: 18 mg/dL (ref 0–40)

## 2017-06-10 LAB — HEPARIN LEVEL (UNFRACTIONATED): Heparin Unfractionated: 0.53 IU/mL (ref 0.30–0.70)

## 2017-06-10 MED ORDER — BACITRACIN-NEOMYCIN-POLYMYXIN 400-5-5000 EX OINT
TOPICAL_OINTMENT | Freq: Two times a day (BID) | CUTANEOUS | Status: DC
  Filled 2017-06-10: qty 1

## 2017-06-10 MED ORDER — BACITRACIN-NEOMYCIN-POLYMYXIN OINTMENT TUBE
TOPICAL_OINTMENT | Freq: Two times a day (BID) | CUTANEOUS | Status: DC
  Administered 2017-06-10 – 2017-06-13 (×8): via TOPICAL
  Filled 2017-06-10: qty 14

## 2017-06-10 MED ORDER — RAMIPRIL 1.25 MG PO CAPS
1.2500 mg | ORAL_CAPSULE | Freq: Every day | ORAL | Status: DC
  Administered 2017-06-11 – 2017-06-13 (×3): 1.25 mg via ORAL
  Filled 2017-06-10 (×3): qty 1

## 2017-06-10 NOTE — Progress Notes (Addendum)
Progress Note  Patient Name: [name] Date of Encounter: 06/10/2017  Primary Cardiologist: Peter Swaziland, MD   Subjective   No chest discomfort since last night. He noted some mild shortness of breath briefly while up in chair this morning. No orthopnea or PND. He does get a headache with the Imdur. Advised to ask for Tylenol with it.   Inpatient Medications    Scheduled Meds: . aspirin EC  81 mg Oral Daily  . carvedilol  3.125 mg Oral BID WC  . fenofibrate  54 mg Oral Daily  . furosemide  40 mg Oral Daily  . gabapentin  300 mg Oral QHS  . isosorbide mononitrate  15 mg Oral Daily  . pantoprazole  20 mg Oral Daily  . sodium chloride flush  3 mL Intravenous Q12H  . zolpidem  10 mg Oral QHS   Continuous Infusions: . sodium chloride    . heparin 1,400 Units/hr (06/10/17 0600)   PRN Meds: sodium chloride, acetaminophen, morphine injection, nitroGLYCERIN, ondansetron (ZOFRAN) IV, sodium chloride flush   Vital Signs    Vitals:   06/10/17 0408 06/10/17 0500 06/10/17 0747 06/10/17 1130  BP: 131/75  133/77 120/64  Pulse: 75  72 73  Resp: 18     Temp: (!) 97.3 F (36.3 C)  97.9 F (36.6 C) 98 F (36.7 C)  TempSrc: Oral  Oral Oral  SpO2: 97%  98% 100%  Weight:  222 lb 1.6 oz (100.7 kg)    Height:        Intake/Output Summary (Last 24 hours) at 06/10/2017 1327 Last data filed at 06/10/2017 1228 Gross per 24 hour  Intake 1426 ml  Output -  Net 1426 ml   Filed Weights   06/08/17 0109 06/09/17 0518 06/10/17 0500  Weight: 225 lb (102.1 kg) 223 lb (101.2 kg) 222 lb 1.6 oz (100.7 kg)    Telemetry    Sinus rhythm in the 60's-70's with PVCs - Personally Reviewed  ECG    Sinus rhythm with LBBB with no significant changes - Personally Reviewed  Physical Exam   GEN: No acute distress.   Neck: No JVD Cardiac: RRR, no murmurs, rubs, or gallops.  Respiratory: Clear to auscultation bilaterally. GI: Soft, nontender, non-distended  MS: No edema; No deformity. Neuro:   Nonfocal  Psych: Normal affect   Labs    Chemistry Recent Labs  Lab 06/07/17 1733 06/08/17 0720 06/09/17 1254 06/10/17 0337  NA 140 141 138 139  K 3.8 3.5 3.9 3.8  CL 107 102 101 107  CO2 22 28 26 25   GLUCOSE 159* 102* 86 95  BUN 24* 22* 20 16  CREATININE 1.22 1.28* 1.25* 1.14  CALCIUM 9.2 9.0 8.8* 9.0  PROT 6.3*  --   --  6.2*  ALBUMIN 3.9  --   --  3.6  AST 31  --   --  22  ALT 21  --   --  19  ALKPHOS 35*  --   --  28*  BILITOT 0.7  --   --  0.9  GFRNONAA >60 58* 60* >60  GFRAA >60 >60 >60 >60  ANIONGAP 11 11 11 7      Hematology Recent Labs  Lab 06/08/17 0720 06/09/17 0421 06/10/17 0337  WBC 7.3 6.2 4.9  RBC 4.68 4.61 4.55  HGB 14.2 13.8 13.9  HCT 43.5 43.4 43.2  MCV 92.9 94.1 94.9  MCH 30.3 29.9 30.5  MCHC 32.6 31.8 32.2  RDW 13.5 13.4 13.3  PLT 257 251 246    Cardiac Enzymes Recent Labs  Lab 06/07/17 2134 06/08/17 0122 06/08/17 0720 06/08/17 1100  TROPONINI 0.21* 0.28* 0.21* 0.22*   No results for input(s): TROPIPOC in the last 168 hours.   BNP Recent Labs  Lab 06/07/17 1733  BNP 177.1*     DDimer  Recent Labs  Lab 06/07/17 1959  DDIMER 1.88*     Radiology    Dg Chest 2 View  Result Date: 06/10/2017 CLINICAL DATA:  Chest pain and shortness of breath since Monday, history coronary disease post MI, CHF, hypertension, former smoker EXAM: CHEST - 2 VIEW COMPARISON:  Portable exam of 06/07/2017 FINDINGS: Upper normal heart size. Mediastinal contours and pulmonary vascularity normal. Atherosclerotic calcification aorta. Decreased interstitial prominence since previous exam likely reflecting improved edema. No segmental consolidation, pleural effusion or pneumothorax. Bones demineralized. IMPRESSION: Improved pulmonary edema. Electronically Signed   By: Ulyses Southward M.D.   On: 06/10/2017 09:39    Cardiac Studies   Echocardiogram 06/09/17 Study Conclusions  - Left ventricle: The cavity size was normal. Wall thickness was increased in a  pattern of moderate LVH. Incoordinate septal motion. Systolic function was severely reduced. The estimated ejection fraction was in the range of 20% to 25%. Diffuse hypokinesis. Coarse trabeulcation / false tendonae at the LV apex   - Definity contrast seen filling a network structure, without   filling defect suggestive of thrombus. The study is not   technically sufficient to allow evaluation of LV diastolic   function. - Aortic valve: Trileaflet. Sclerosis without stenosis. There was   moderate regurgitation. - Aorta: Ascending aortic diameter: 41 mm (S). - Ascending aorta: The ascending aorta was mildly dilated. - Mitral valve: Mildly thickened leaflets . There was trivial   regurgitation. - Left atrium: The atrium was normal in size. - Atrial septum: No defect or patent foramen ovale was identified. - Inferior vena cava: The vessel was normal in size. The   respirophasic diameter changes were in the normal range (>= 50%),   consistent with normal central venous pressure.  Impressions: - Compared to a prior study in 2016, the LVEF is much lower at   20-25%. There is coarse trabeulation ./ false tendonae at the LV apex - Definity contrast opacifies the LV and fills the void up   to the myocardium and therefore is negative for apical thrombus. The ascending aorta is dilated to 4.1 cm.  RIGHT/LEFT HEART CATH AND CORONARY ANGIOGRAPHY 06/08/17  Conclusion    Prox RCA lesion is 70% stenosed.  Mid RCA lesion is 50% stenosed.  Previously placed Mid RCA to Dist RCA stent (unknown type) is widely patent.  Previously placed Dist RCA stent (unknown type) is widely patent.  Post Atrio lesion is 50% stenosed.  Ost LAD to Prox LAD lesion is 99% stenosed.  Ost 1st Diag to 1st Diag lesion is 95% stenosed.  Ost Ramus to Ramus lesion is 80% stenosed.  Ost 1st Mrg lesion is 75% stenosed.  There is severe left ventricular systolic dysfunction.  LV end diastolic pressure is normal.  The  left ventricular ejection fraction is less than 25% by visual estimate.  LV end diastolic pressure is normal.  1. Severe 3 vessel obstructive CAD -99% ostial LAD with TIMI 1 flow. The vessel fills antegrade to the second diagonal. There are good right to left collaterals to the mid to distal LAD. The vessel appears large. - 95% proximal first diagonal - 80% proximal ramus intermediate - 75% large  OM1 - 70% proximal RCA 2. Severe LV dysfunction. EF estimated at 20%. 3. Low LVEDP and PCWP c/w effective diuresis 4. Normal right heart pressures 5. Normal cardiac output. Index 3.28.   Plan: Recommend CT surgery consult for revascularization- message sent. Echo pending. The ostial LAD is not suitable for PCI. Optimize medical therapy for CHF. Will discontinue amlodipine. Start Coreg. Consider Entresto and aldactone. If pacemaker/ICD needed in the future would recommend using a left subclavian approach.     Diagnostic Diagram          Patient Profile     62 y.o. male with CAD s/p multiple PCIs, familial hyperlipidemia on Repatha, and chronic systolic and diastolic heart failure here with NSTEMI and hypertensive urgency. Troponin 0.28. Cath on 06/08/17 showed multivessel CAD. EF 20-25%. Plan for CABG on Monday.  Assessment & Plan    NSTEMI -Troponin 0.28 -LHC done 06/08/17 revealed severe 3 vessel obstructive CAD and Severe LV dysfunction with EF estimated at 20%. (see above report) -CVTS has been consulted with plan for CABG on Monday which is the next available. -IV heparin infusing -Had brief chest discomfort last evening relieved by SL NTG X1. No further chest pain. Had some mild brief shortness of breath this morning while up in chair. -continue Imdur. Pt encouraged to take Tylenol for headache.   Acute systolic heart failure -In setting of hypertensive urgency and multivessel CAD -EF estimated to be 20% by cath yesterday. This is worsened from prior of 38% on  nuc in 02/2016 and normal LVEF in 2016.  -Echo done yesterday confirms EF 20-25%, no LV thrombus -BNP was only mildly elevated -Pt was diuresed with lasix 40 mg IV X2 with improvement in breathing.  -No orthpnea or DOE. He has mild occ brief shortness of breath likely related to myocardial ischemia. He appears euvolemic.  -RHC showed Low LVEDP and PCWP c/w effective diuresis, Normal right heart pressures, Normal cardiac output. Index 3.28.  -Carvedilol 3.125 mg bid has been added. Plan to use Entresto and add spironolactone at some point. BP is soft and probably won't tolerate addition of meds at this time. Could possibly add his low dose ACE-I back.   Hypertension -Initial SBP 180 despite taking all medications as prescribed.  -Amlodipine has been stopped and coreg initiated for heart failure.  -BP has been well controlled and soft at times yesterday.  -As above, ?resume low dose ramipril with his soft BP.  Pt reports having trouble tolerating meds in the past.   Hyperlipidemia -On PCSK9 inhibitor, Repatha. LDL in 08/2016 was 18. Very well controlled.   LBBB chronic   For questions or updates, please contact [facility] Please consult [email or web address] for contact info under Cardiology/STEMI.      Signed, Berton Bon, NP  06/10/2017, 1:27 PM    Attending Addendum: History and all data above reviewed.  Patient examined.  I agree with the findings as above.  All available labs, radiology testing, previous records reviewed. Agree with documented assessment and plan. Mr. Nilsson is a 70M with with CAD s/p multiple PCIs, familial hyperlipidemia on Repatha, and chronic systolic and diastolic heart failure here with NSTEMI and hypertensive urgency.  LHC revealed severe 3 vessel disease.  He was not a good candidate for PCI of the LAD.  LVEF was 25% on left ventriculogram.  Echo revealed LVEF 20-25%.  He is euvolemic and renal function stable. He is currently chest pain free and complains of  headache from Imdur.  We will stop nitrates  and use morphine prn chest pain.  Resume ramipril 1.25mg  daily.  His home amlodipine was discontinued.  Carvedilol was started this admission.  He will go for CABG 6/10 with Dr. Donata Clay.  LDL very well-controlled on Repatha.     Maryagnes Carrasco C. [name], MD, [facility]  06/10/2017 2:31 PM

## 2017-06-10 NOTE — Progress Notes (Signed)
ANTICOAGULATION CONSULT NOTE - Follow Up Consult  Pharmacy Consult for Heparin Indication: 3VCAD  Allergies  Allergen Reactions  . Ezetimibe Other (See Comments)    Joint pain and drops BP  . Niacin Other (See Comments)    Drops BP and severe  . Statins Other (See Comments)    Severe joint pain and drops BP.    Patient Measurements: Height: 6\' 2"  (188 cm) Weight: 222 lb 1.6 oz (100.7 kg) IBW/kg (Calculated) : 82.2 Heparin Dosing Weight:  101.2 kg  Vital Signs: Temp: 97.9 F (36.6 C) (06/06 0747) Temp Source: Oral (06/06 0747) BP: 133/77 (06/06 0747) Pulse Rate: 72 (06/06 0747)  Labs: Recent Labs    06/08/17 0122 06/08/17 0720 06/08/17 1100 06/09/17 0421 06/09/17 0803 06/09/17 1254 06/10/17 0337  HGB  --  14.2  --  13.8  --   --  13.9  HCT  --  43.5  --  43.4  --   --  43.2  PLT  --  257  --  251  --   --  246  LABPROT  --  14.2  --   --   --   --   --   INR  --  1.11  --   --   --   --   --   HEPARINUNFRC  --  0.44  --   --  0.42  --  0.53  CREATININE  --  1.28*  --   --   --  1.25* 1.14  TROPONINI 0.28* 0.21* 0.22*  --   --   --   --     Estimated Creatinine Clearance: 84.1 mL/min (by C-G formula based on SCr of 1.14 mg/dL).   Assessment: 62 year old male with three vessel CAD on IV Heparin. Current plan is for CABG on Monday 06/14/17. Continuing IV Heparin until surgery.   Heparin level remains therapeutic on 1400 units/hr. CBC is stable. No bleeding has been reported.   Goal of Therapy:  Heparin level 0.3-0.7 units/ml Monitor platelets by anticoagulation protocol: Yes   Plan:  Continue heparin at 1400 units/hr. Daily HL/CBC. CABG Monday 6/10.  Link Snuffer, PharmD, BCPS, BCCCP Clinical Pharmacist Clinical phone 06/10/2017 until 3:30PM [phone] After hours, please call [phone] 06/10/2017,8:32 AM

## 2017-06-10 NOTE — Progress Notes (Signed)
CARDIAC REHAB PHASE I   PRE:  Rate/Rhythm: 74 SR BBB  BP:  Supine: 129/74  Sitting:   Standing:    SaO2: 96%RA  MODE:  Ambulation: 470 ft   POST:  Rate/Rhythm: 86 SR BBB  BP:  Supine: 135/75  Sitting:   Standing:    SaO2: 97%RA 1430-1450 Pt walked 470 ft on RA with steady gait and no CP. Tolerated well. Encouraged walks with staff.   Luetta Nutting, RN BSN  06/10/2017 2:46 PM

## 2017-06-11 ENCOUNTER — Inpatient Hospital Stay (HOSPITAL_COMMUNITY): Payer: BLUE CROSS/BLUE SHIELD

## 2017-06-11 DIAGNOSIS — Z01810 Encounter for preprocedural cardiovascular examination: Secondary | ICD-10-CM

## 2017-06-11 LAB — BASIC METABOLIC PANEL
Anion gap: 6 (ref 5–15)
BUN: 17 mg/dL (ref 6–20)
CO2: 25 mmol/L (ref 22–32)
Calcium: 9 mg/dL (ref 8.9–10.3)
Chloride: 108 mmol/L (ref 101–111)
Creatinine, Ser: 1.18 mg/dL (ref 0.61–1.24)
GFR calc Af Amer: >60 mL/min (ref >60)
GFR calc non Af Amer: >60 mL/min (ref >60)
Glucose, Bld: 95 mg/dL (ref 65–99)
Potassium: 3.9 mmol/L (ref 3.5–5.1)
Sodium: 139 mmol/L (ref 135–145)

## 2017-06-11 LAB — CBC
HCT: 42 % (ref 39.0–52.0)
Hemoglobin: 13.9 g/dL (ref 13.0–17.0)
MCH: 30.4 pg (ref 26.0–34.0)
MCHC: 33.1 g/dL (ref 30.0–36.0)
MCV: 91.9 fL (ref 78.0–100.0)
Platelets: 246 10*3/uL (ref 150–400)
RBC: 4.57 MIL/uL (ref 4.22–5.81)
RDW: 13.1 % (ref 11.5–15.5)
WBC: 4.4 10*3/uL (ref 4.0–10.5)

## 2017-06-11 LAB — PULMONARY FUNCTION TEST
DL/VA % pred: 70 %
DL/VA: 3.39 ml/min/mmHg/L
DLCO cor % pred: 57 %
DLCO cor: 21.78 ml/min/mmHg
DLCO unc % pred: 56 %
DLCO unc: 21.33 ml/min/mmHg
FEF 25-75 Post: 2.95 L/sec
FEF 25-75 Pre: 2.91 L/sec
FEF2575-%Change-Post: 1 %
FEF2575-%Pred-Post: 92 %
FEF2575-%Pred-Pre: 91 %
FEV1-%Change-Post: 2 %
FEV1-%Pred-Post: 83 %
FEV1-%Pred-Pre: 81 %
FEV1-Post: 3.37 L
FEV1-Pre: 3.3 L
FEV1FVC-%Change-Post: -4 %
FEV1FVC-%Pred-Pre: 102 %
FEV6-%Change-Post: 5 %
FEV6-%Pred-Post: 86 %
FEV6-%Pred-Pre: 81 %
FEV6-Post: 4.4 L
FEV6-Pre: 4.16 L
FEV6FVC-%Change-Post: -1 %
FEV6FVC-%Pred-Post: 99 %
FEV6FVC-%Pred-Pre: 101 %
FVC-%Change-Post: 7 %
FVC-%Pred-Post: 85 %
FVC-%Pred-Pre: 80 %
FVC-Post: 4.61 L
FVC-Pre: 4.3 L
Post FEV1/FVC ratio: 73 %
Post FEV6/FVC ratio: 96 %
Pre FEV1/FVC ratio: 77 %
Pre FEV6/FVC Ratio: 97 %
RV % pred: 87 %
RV: 2.2 L
TLC % pred: 87 %
TLC: 6.85 L

## 2017-06-11 LAB — HEPARIN LEVEL (UNFRACTIONATED): Heparin Unfractionated: 0.55 IU/mL (ref 0.30–0.70)

## 2017-06-11 MED ORDER — NITROGLYCERIN IN D5W 200-5 MCG/ML-% IV SOLN
2.0000 ug/min | INTRAVENOUS | Status: DC
  Filled 2017-06-11: qty 250

## 2017-06-11 MED ORDER — SODIUM CHLORIDE 0.9 % IV SOLN
INTRAVENOUS | Status: DC
  Filled 2017-06-11: qty 1

## 2017-06-11 MED ORDER — POTASSIUM CHLORIDE 2 MEQ/ML IV SOLN
80.0000 meq | INTRAVENOUS | Status: DC
  Filled 2017-06-11: qty 40

## 2017-06-11 MED ORDER — VANCOMYCIN HCL 10 G IV SOLR
1500.0000 mg | INTRAVENOUS | Status: AC
  Administered 2017-06-14: 1500 mg via INTRAVENOUS
  Filled 2017-06-11: qty 1500

## 2017-06-11 MED ORDER — SODIUM CHLORIDE 0.9 % IV SOLN
30.0000 ug/min | INTRAVENOUS | Status: AC
  Administered 2017-06-14: 20 ug/min via INTRAVENOUS
  Filled 2017-06-11: qty 20

## 2017-06-11 MED ORDER — SODIUM CHLORIDE 0.9 % IV SOLN
750.0000 mg | INTRAVENOUS | Status: DC
  Filled 2017-06-11: qty 750

## 2017-06-11 MED ORDER — SPIRONOLACTONE 25 MG PO TABS
25.0000 mg | ORAL_TABLET | Freq: Every day | ORAL | Status: DC
Start: 2017-06-11 — End: 2017-06-14
  Administered 2017-06-11 – 2017-06-13 (×3): 25 mg via ORAL
  Filled 2017-06-11 (×3): qty 1

## 2017-06-11 MED ORDER — EPINEPHRINE PF 1 MG/ML IJ SOLN
0.0000 ug/min | INTRAMUSCULAR | Status: DC
  Filled 2017-06-11: qty 4

## 2017-06-11 MED ORDER — DOPAMINE-DEXTROSE 3.2-5 MG/ML-% IV SOLN
0.0000 ug/kg/min | INTRAVENOUS | Status: AC
  Administered 2017-06-14: 3 ug/kg/min via INTRAVENOUS
  Filled 2017-06-11: qty 250

## 2017-06-11 MED ORDER — SODIUM CHLORIDE 0.9 % IV SOLN
1.5000 g | INTRAVENOUS | Status: AC
  Administered 2017-06-14: 1.5 g via INTRAVENOUS
  Administered 2017-06-14: .75 g via INTRAVENOUS
  Filled 2017-06-11: qty 1.5

## 2017-06-11 MED ORDER — SODIUM CHLORIDE 0.9 % IV SOLN
INTRAVENOUS | Status: DC
  Filled 2017-06-11: qty 30

## 2017-06-11 MED ORDER — TRANEXAMIC ACID (OHS) BOLUS VIA INFUSION
15.0000 mg/kg | INTRAVENOUS | Status: AC
  Administered 2017-06-14: 1510.5 mg via INTRAVENOUS
  Filled 2017-06-11: qty 1511

## 2017-06-11 MED ORDER — LIVING BETTER WITH HEART FAILURE BOOK: Freq: Once | Status: DC

## 2017-06-11 MED ORDER — MILRINONE LACTATE IN DEXTROSE 20-5 MG/100ML-% IV SOLN
0.1250 ug/kg/min | INTRAVENOUS | Status: DC
Start: 2017-06-14 — End: 2017-06-14
  Administered 2017-06-14: 0.25 ug/kg/min via INTRAVENOUS
  Filled 2017-06-11 (×2): qty 100

## 2017-06-11 MED ORDER — MAGNESIUM SULFATE 50 % IJ SOLN
40.0000 meq | INTRAMUSCULAR | Status: DC
  Filled 2017-06-11: qty 9.85

## 2017-06-11 MED ORDER — TRANEXAMIC ACID 1000 MG/10ML IV SOLN
1.5000 mg/kg/h | INTRAVENOUS | Status: DC
  Filled 2017-06-11: qty 25

## 2017-06-11 MED ORDER — TRANEXAMIC ACID (OHS) PUMP PRIME SOLUTION
2.0000 mg/kg | INTRAVENOUS | Status: DC
  Filled 2017-06-11: qty 2.01

## 2017-06-11 MED ORDER — DEXMEDETOMIDINE HCL IN NACL 400 MCG/100ML IV SOLN
0.1000 ug/kg/h | INTRAVENOUS | Status: DC
  Filled 2017-06-11: qty 100

## 2017-06-11 MED ORDER — ALBUTEROL SULFATE (2.5 MG/3ML) 0.083% IN NEBU
2.5000 mg | INHALATION_SOLUTION | Freq: Once | RESPIRATORY_TRACT | Status: AC
  Administered 2017-06-11: 2.5 mg via RESPIRATORY_TRACT

## 2017-06-11 MED ORDER — PLASMA-LYTE 148 IV SOLN
INTRAVENOUS | Status: AC
  Administered 2017-06-14: 500 mL
  Filled 2017-06-11: qty 2.5

## 2017-06-11 NOTE — Progress Notes (Signed)
CARDIAC REHAB PHASE I   PRE:  Rate/Rhythm: 78 SR BBB  BP:  Supine:   Sitting: 100/66  Standing:    SaO2: 96%RA  MODE:  Ambulation: 940 ft   POST:  Rate/Rhythm: 91 SR BBB  BP:  Supine:   Sitting: 123/66  Standing:    SaO2: 97%RA 1337-1400 Pt glad to walk. Pt walked 940 ft on RA with steady gait. Tolerated well. No CP.   Luetta Nutting, RN BSN  06/11/2017 1:54 PM

## 2017-06-11 NOTE — Progress Notes (Signed)
PRELIM  Right Carotid:Velocities in the right ICA are consistent with a 40-59% stenosis. The ECA appears >50% stenosed.  Left Carotid: Velocities in the left ICA are consistent with a 40-59% stenosis.    Right Upper Extremity: Radial Doppler waveform obliterate with compression. Ulnar Doppler waveforms remain within normal limits with compression.  Left Upper Extremity: Radial Doppler waveforms decrease >50% with compression. Ulnar Doppler waveforms decrease 50% with compression.   Pedal artery waveforms WNL.    Farrel Demark, RDMS, RVT

## 2017-06-11 NOTE — Progress Notes (Signed)
ANTICOAGULATION CONSULT NOTE - Follow Up Consult  Pharmacy Consult for Heparin Indication: 3VCAD  Allergies  Allergen Reactions  . Ezetimibe Other (See Comments)    Joint pain and drops BP  . Niacin Other (See Comments)    Drops BP and severe  . Statins Other (See Comments)    Severe joint pain and drops BP.    Patient Measurements: Height: 6\' 2"  (188 cm) Weight: 222 lb 1.6 oz (100.7 kg) IBW/kg (Calculated) : 82.2 Heparin Dosing Weight:  101.2 kg  Vital Signs: Temp: 97.6 F (36.4 C) (06/07 0807) Temp Source: Oral (06/07 0807) BP: 116/62 (06/07 0807) Pulse Rate: 76 (06/07 0829)  Labs: Recent Labs    06/08/17 1100  06/09/17 0421 06/09/17 0803 06/09/17 1254 06/10/17 0337 06/11/17 0548  HGB  --    < > 13.8  --   --  13.9 13.9  HCT  --   --  43.4  --   --  43.2 42.0  PLT  --   --  251  --   --  246 246  HEPARINUNFRC  --   --   --  0.42  --  0.53 0.55  CREATININE  --   --   --   --  1.25* 1.14 1.18  TROPONINI 0.22*  --   --   --   --   --   --    < > = values in this interval not displayed.    Estimated Creatinine Clearance: 81.2 mL/min (by C-G formula based on SCr of 1.18 mg/dL).   Assessment: 62 year old male with three vessel CAD on IV Heparin. Current plan is for CABG on Monday 06/14/17. Continuing IV Heparin until surgery.  -Heparin level remains therapeutic on 1400 units/hr.  Goal of Therapy:  Heparin level 0.3-0.7 units/ml Monitor platelets by anticoagulation protocol: Yes   Plan:  Continue heparin at 1400 units/hr. Daily HL/CBC. CABG Monday 6/10.  Harland German, PharmD Clinical Pharmacist Clinical phone from 8:30-4:00 is [phone] After 4pm, please call Main Rx [phone]) for assistance. 06/11/2017 10:44 AM

## 2017-06-11 NOTE — Progress Notes (Signed)
Progress Note  Patient Name: [name] Date of Encounter: 06/11/2017  Primary Cardiologist: Dr. [place]aziland  Subjective   Feeling fine this morning, just difficult to sleep comfortably on the bed due to h/o back surgeries. No CP or SOB, No edema.  Inpatient Medications    Scheduled Meds: . aspirin EC  81 mg Oral Daily  . carvedilol  3.125 mg Oral BID WC  . furosemide  40 mg Oral Daily  . gabapentin  300 mg Oral QHS  . [START ON 06/14/2017] heparin-papaverine-plasmalyte irrigation   Irrigation To OR  . [START ON 06/14/2017] magnesium sulfate  40 mEq Other To OR  . neomycin-bacitracin-polymyxin   Topical BID  . pantoprazole  20 mg Oral Daily  . [START ON 06/14/2017] potassium chloride  80 mEq Other To OR  . ramipril  1.25 mg Oral Daily  . sodium chloride flush  3 mL Intravenous Q12H  . [START ON 06/14/2017] tranexamic acid  15 mg/kg Intravenous To OR  . [START ON 06/14/2017] tranexamic acid  2 mg/kg Intracatheter To OR  . zolpidem  10 mg Oral QHS   Continuous Infusions: . sodium chloride    . [START ON 06/14/2017] cefUROXime (ZINACEF)  IV    . [START ON 06/14/2017] cefUROXime (ZINACEF)  IV    . [START ON 06/14/2017] dexmedetomidine    . [START ON 06/14/2017] DOPamine    . [START ON 06/14/2017] epinephrine    . [START ON 06/14/2017] heparin 30,000 units/NS 1000 mL solution for CELLSAVER    . heparin 1,400 Units/hr (06/11/17 0513)  . [START ON 06/14/2017] insulin (NOVOLIN-R) infusion    . [START ON 06/14/2017] milrinone    . [START ON 06/14/2017] nitroGLYCERIN    . [START ON 06/14/2017] phenylephrine 20mg /281mL NS (0.08mg /ml) infusion    . [START ON 06/14/2017] tranexamic acid (CYKLOKAPRON) infusion ([facility])    . [START ON 06/14/2017] vancomycin     PRN Meds: sodium chloride, acetaminophen, morphine injection, nitroGLYCERIN, ondansetron (ZOFRAN) IV, sodium chloride flush   Vital Signs    Vitals:   06/10/17 1603 06/10/17 2023 06/11/17 0108 06/11/17 0436  BP: 108/71 127/67 106/61 109/65    Pulse: 79 72 67 62  Resp:  16 16 16   Temp: 97.8 F (36.6 C) 97.9 F (36.6 C) 97.7 F (36.5 C) 97.7 F (36.5 C)  TempSrc: Oral Oral Oral Oral  SpO2: 95% 96% 95% 94%  Weight:      Height:        Intake/Output Summary (Last 24 hours) at 06/11/2017 0806 Last data filed at 06/11/2017 5320 Gross per 24 hour  Intake 941.7 ml  Output -  Net 941.7 ml   Filed Weights   06/08/17 0109 06/09/17 0518 06/10/17 0500  Weight: 225 lb (102.1 kg) 223 lb (101.2 kg) 222 lb 1.6 oz (100.7 kg)    Telemetry    NSR - Personally Reviewed  Physical Exam   GEN: No acute distress.  HEENT: Normocephalic, atraumatic, sclera non-icteric. Neck: No JVD or bruits. Cardiac: RRR no murmurs, rubs, or gallops.  Radials/DP/PT 1+ and equal bilaterally.  Respiratory: Clear to auscultation bilaterally. Breathing is unlabored. GI: Soft, nontender, non-distended, BS +x 4. MS: no deformity. Extremities: No clubbing or cyanosis. No edema. Distal pedal pulses are 2+ and equal bilaterally. Neuro:  AAOx3. Follows commands. Psych:  Responds to questions appropriately with a normal affect.  Labs    Chemistry Recent Labs  Lab 06/07/17 1733  06/09/17 1254 06/10/17 0337 06/11/17 0548  NA 140   < >  138 139 139  K 3.8   < > 3.9 3.8 3.9  CL 107   < > 101 107 108  CO2 22   < > GLUCOSE 159*   < > 86 95 95  BUN 24*   < > CREATININE 1.22   < > 1.25* 1.14 1.18  CALCIUM 9.2   < > 8.8* 9.0 9.0  PROT 6.3*  --   --  6.2*  --   ALBUMIN 3.9  --   --  3.6  --   AST 31  --   --  22  --   ALT 21  --   --  19  --   ALKPHOS 35*  --   --  28*  --   BILITOT 0.7  --   --  0.9  --   GFRNONAA >60   < > 60* >60 >60  GFRAA >60   < > >60 >60 >60  ANIONGAP 11   < > < > = values in this interval not displayed.     Hematology Recent Labs  Lab 06/09/17 0421 06/10/17 0337 06/11/17 0548  WBC 6.2 4.9 4.4  RBC 4.61 4.55 4.57  HGB 13.8 13.9 13.9  HCT 43.4 43.2 42.0  MCV 94.1 94.9 91.9  MCH 29.9 30.5  30.4  MCHC 31.8 32.2 33.1  RDW 13.4 13.3 13.1  PLT 251 246 246    Cardiac Enzymes Recent Labs  Lab 06/07/17 2134 06/08/17 0122 06/08/17 0720 06/08/17 1100  TROPONINI 0.21* 0.28* 0.21* 0.22*   No results for input(s): TROPIPOC in the last 168 hours.   BNP Recent Labs  Lab 06/07/17 1733  BNP 177.1*     DDimer  Recent Labs  Lab 06/07/17 1959  DDIMER 1.88*     Radiology    Dg Chest 2 View  Result Date: 06/10/2017 CLINICAL DATA:  Chest pain and shortness of breath since Monday, history coronary disease post MI, CHF, hypertension, former smoker EXAM: CHEST - 2 VIEW COMPARISON:  Portable exam of 06/07/2017 FINDINGS: Upper normal heart size. Mediastinal contours and pulmonary vascularity normal. Atherosclerotic calcification aorta. Decreased interstitial prominence since previous exam likely reflecting improved edema. No segmental consolidation, pleural effusion or pneumothorax. Bones demineralized. IMPRESSION: Improved pulmonary edema. Electronically Signed   By: Ulyses Southward M.D.   On: 06/10/2017 09:39    Cardiac Studies    RIGHT/LEFT HEART CATH AND CORONARY ANGIOGRAPHY6/4/19  Conclusion    Prox RCA lesion is 70% stenosed.  Mid RCA lesion is 50% stenosed.  Previously placed Mid RCA to Dist RCA stent (unknown type) is widely patent.  Previously placed Dist RCA stent (unknown type) is widely patent.  Post Atrio lesion is 50% stenosed.  Ost LAD to Prox LAD lesion is 99% stenosed.  Ost 1st Diag to 1st Diag lesion is 95% stenosed.  Ost Ramus to Ramus lesion is 80% stenosed.  Ost 1st Mrg lesion is 75% stenosed.  There is severe left ventricular systolic dysfunction.  LV end diastolic pressure is normal.  The left ventricular ejection fraction is less than 25% by visual estimate.  LV end diastolic pressure is normal.  1. Severe 3 vessel obstructive CAD -99% ostial LAD with TIMI 1 flow. The vessel fills antegrade to the second diagonal. There are good  right to left collaterals to the mid to distal LAD. The vessel appears large. - 95% proximal first diagonal - 80% proximal ramus intermediate -  75% large OM1 - 70% proximal RCA 2. Severe LV dysfunction. EF estimated at 20%. 3. Low LVEDP and PCWP c/w effective diuresis 4. Normal right heart pressures 5. Normal cardiac output. Index 3.28.   Plan: Recommend CT surgery consult for revascularization- message sent. Echo pending. The ostial LAD is not suitable for PCI. Optimize medical therapy for CHF. Will discontinue amlodipine. Start Coreg. Consider Entresto and aldactone. If pacemaker/ICD needed in the future would recommend using a left subclavian approach.    Echocardiogram 06/09/17 Study Conclusions  - Left ventricle: The cavity size was normal. Wall thickness wasincreased in a pattern of moderate LVH. Incoordinate septalmotion. Systolic function was severely reduced. The estimatedejection fraction was in the range of 20% to 25%. Diffusehypokinesis. Coarse trabeulcation / false tendonae at the LV apex - Definity contrast seen filling a network structure, without filling defect suggestive of thrombus. The study is not technically sufficient to allow evaluation of LV diastolic function. - Aortic valve: Trileaflet. Sclerosis without stenosis. There was moderate regurgitation. - Aorta: Ascending aortic diameter: 41 mm (S). - Ascending aorta: The ascending aorta was mildly dilated. - Mitral valve: Mildly thickened leaflets . There was trivial regurgitation. - Left atrium: The atrium was normal in size. - Atrial septum: No defect or patent foramen ovale was identified. - Inferior vena cava: The vessel was normal in size. The respirophasic diameter changes were in the normal range (>= 50%), consistent with normal central venous pressure.  Impressions: - Compared to a prior study in 2016, the LVEF is much lower at 20-25%. There is coarse trabeulation ./  false tendonae at the LV apex - Definity contrast opacifies the LV and fills the void up to the myocardium and therefore is negative for apical thrombus. The ascending aorta is dilated to 4.1 cm.    Patient Profile     62 y.o. male with CAD with multiple PCIs (previous cath stable in 2016), ischemic cardiomyopathy (EF 40-45% in 2016, 38% by nuc 02/2016), HTN, HLD on Repatha, LBBB, varicose veins admitted with flash pulm edema/NSTEMI/accelerated HTN, found to have multivessel disease requiring CABG and declining EF to 20-25% by echocardiogram.  Assessment & Plan    1. NSTEMI/CAD - For CABG 6/10. Remains on ASA, heparin, carvedilol. On repatha as OP. Post bypass would likely benefit from Plavix when felt safe for surgery given NSTEMI this admission.  2. Acute on chronic systolic CHF - LV function worsened since 2018. RHC showedLow LVEDP and PCWP c/w effective diuresis,Normal right heart pressures,Normal cardiac output. After revascularization and medical therapy, will need reassessment because if LV remains down, may need to consider CRT-D (has LBBB). Now on BB and back on very low dose ramipril. Doubt BP will tolerate Entresto so will hold off. Consider decreasing Lasix and adding low dose spironolactone. In the past he has not tolerated aggressive med titration so need to move cautiously. He appears euvolemic and was not on diuretic PTA. Will hold Lasix dose today and review with MD. Will start I/Os and daily weights. Long discussion about CHF, Reviewed 2g sodium restriction, 2L fluid restriction, daily weights with patient.  2. HLD - LDL 25. Continue Repatha.  4. HTN - on softer side this admission.  5. CKD stage II - CrCl 81. Baseline Cr appears 1.1-1.2, stable.   For questions or updates, please contact [facility] Please consult [email or web address] for contact info under Cardiology/STEMI.  Signed, Laurann Montana, PA-C 06/11/2017, 8:06 AM

## 2017-06-12 DIAGNOSIS — I447 Left bundle-branch block, unspecified: Secondary | ICD-10-CM

## 2017-06-12 DIAGNOSIS — I255 Ischemic cardiomyopathy: Secondary | ICD-10-CM

## 2017-06-12 LAB — URINALYSIS, ROUTINE W REFLEX MICROSCOPIC
Bilirubin Urine: NEGATIVE
Glucose, UA: NEGATIVE mg/dL
Ketones, ur: NEGATIVE mg/dL
Leukocytes, UA: NEGATIVE
Nitrite: POSITIVE — AB
Protein, ur: NEGATIVE mg/dL
Specific Gravity, Urine: 1.006 (ref 1.005–1.030)
pH: 7 (ref 5.0–8.0)

## 2017-06-12 LAB — CBC
HCT: 45.7 % (ref 39.0–52.0)
Hemoglobin: 14.7 g/dL (ref 13.0–17.0)
MCH: 30.5 pg (ref 26.0–34.0)
MCHC: 32.2 g/dL (ref 30.0–36.0)
MCV: 94.8 fL (ref 78.0–100.0)
Platelets: 260 10*3/uL (ref 150–400)
RBC: 4.82 MIL/uL (ref 4.22–5.81)
RDW: 13.2 % (ref 11.5–15.5)
WBC: 4.7 10*3/uL (ref 4.0–10.5)

## 2017-06-12 LAB — BASIC METABOLIC PANEL
Anion gap: 10 (ref 5–15)
BUN: 18 mg/dL (ref 6–20)
CO2: 25 mmol/L (ref 22–32)
Calcium: 9.3 mg/dL (ref 8.9–10.3)
Chloride: 105 mmol/L (ref 101–111)
Creatinine, Ser: 1.09 mg/dL (ref 0.61–1.24)
GFR calc Af Amer: >60 mL/min (ref >60)
GFR calc non Af Amer: >60 mL/min (ref >60)
Glucose, Bld: 97 mg/dL (ref 65–99)
Potassium: 4 mmol/L (ref 3.5–5.1)
Sodium: 140 mmol/L (ref 135–145)

## 2017-06-12 LAB — SURGICAL PCR SCREEN
MRSA, PCR: NEGATIVE
Staphylococcus aureus: NEGATIVE

## 2017-06-12 LAB — HEPARIN LEVEL (UNFRACTIONATED): Heparin Unfractionated: 0.64 IU/mL (ref 0.30–0.70)

## 2017-06-12 NOTE — Progress Notes (Signed)
CARDIAC REHAB PHASE I   PRE:  Rate/Rhythm: 71 SR BBB  BP:  Sitting: 134/71      SaO2: 97% RA   MODE:  Ambulation: 940 ft   POST:  Rate/Rhythm: 85 SR BBB  BP:  Sitting: 149/90      SaO2: 97%   Pt ambulated 940 ft. Pt very independent with steady gait. Pt denied any complaints of CP, dizziness or SOB. Pt eager to walk. Pt returned to recliner, with wife at bedside.   [phone]  York Cerise MS, ACSM CEP  10:20 AM 06/12/2017

## 2017-06-12 NOTE — Progress Notes (Signed)
4 Days Post-Op Procedure(s) (LRB): RIGHT/LEFT HEART CATH AND CORONARY ANGIOGRAPHY (N/A) Subjective: Patient doing well without angina or shortness of breath Symptoms of CHF have cleared since admission Because patient has EF 25% with left bundle branch block we will place a LV epicardial lead at the time of CABG in case patient needs CRT postop as suggested by Dr. Graciela Husbands  Preoperative Doppler data reviewed-no significant carotid artery stenosis.  PFTs adequate for sternotomy Plan CABG on June 10 AM  Objective: Vital signs in last 24 hours: Temp:  [97.5 F (36.4 C)-98 F (36.7 C)] 97.5 F (36.4 C) (06/08 0742) Pulse Rate:  [70-74] 70 (06/08 0742) Cardiac Rhythm: Normal sinus rhythm;Bundle branch block (06/08 0945) Resp:  [20] 20 (06/08 0742) BP: (115-137)/(66-85) 137/85 (06/08 0742) SpO2:  [97 %-100 %] 98 % (06/08 0742) Weight:  [222 lb 3.2 oz (100.8 kg)] 222 lb 3.2 oz (100.8 kg) (06/08 0530)  Hemodynamic parameters for last 24 hours:    Intake/Output from previous day: 06/07 0701 - 06/08 0700 In: 1022.5 [P.O.:960; I.V.:62.5] Out: -  Intake/Output this shift: Total I/O In: 562 [P.O.:240; I.V.:322] Out: -        Exam    General- alert and comfortable    Neck- no JVD, no cervical adenopathy palpable, no carotid bruit   Lungs- clear without rales, wheezes   Cor- regular rate and rhythm, no murmur , gallop   Abdomen- soft, non-tender   Extremities - warm, non-tender, minimal edema   Neuro- oriented, appropriate, no focal weakness   Lab Results: Recent Labs    06/11/17 0548 06/12/17 0810  WBC 4.4 4.7  HGB 13.9 14.7  HCT 42.0 45.7  PLT 246 260   BMET:  Recent Labs    06/11/17 0548 06/12/17 0810  NA 139 140  K 3.9 4.0  CL 108 105  CO2 25 25  GLUCOSE 95 97  BUN 17 18  CREATININE 1.18 1.09  CALCIUM 9.0 9.3    PT/INR: No results for input(s): LABPROT, INR in the last 72 hours. ABG    Component Value Date/Time   HCO3 26.1 06/08/2017 1713   TCO2 27  06/08/2017 1713   O2SAT 73.0 06/08/2017 1713   CBG (last 3)  No results for input(s): GLUCAP in the last 72 hours.  Assessment/Plan: S/P Procedure(s) (LRB): RIGHT/LEFT HEART CATH AND CORONARY ANGIOGRAPHY (N/A)  Continue IV heparin until surgery CABG x4 planned 6-10   For 3 vessel CAD with ischemic cardiomyopathy  Kathlee Nations Trigt III 06/12/2017

## 2017-06-12 NOTE — Progress Notes (Signed)
ANTICOAGULATION CONSULT NOTE - Follow Up Consult  Pharmacy Consult for Heparin Indication: 3VCAD  Allergies  Allergen Reactions  . Ezetimibe Other (See Comments)    Joint pain and drops BP  . Niacin Other (See Comments)    Drops BP and severe  . Statins Other (See Comments)    Severe joint pain and drops BP.    Patient Measurements: Height: 6\' 2"  (188 cm) Weight: 222 lb 3.2 oz (100.8 kg) IBW/kg (Calculated) : 82.2 Heparin Dosing Weight:  101.2 kg  Vital Signs: Temp: 97.5 F (36.4 C) (06/08 0742) Temp Source: Oral (06/08 0742) BP: 137/85 (06/08 0742) Pulse Rate: 70 (06/08 0742)  Labs: Recent Labs    06/10/17 0337 06/11/17 0548 06/12/17 0810  HGB 13.9 13.9 14.7  HCT 43.2 42.0 45.7  PLT 246 246 260  HEPARINUNFRC 0.53 0.55 0.64  CREATININE 1.14 1.18 1.09    Estimated Creatinine Clearance: 87.9 mL/min (by C-G formula based on SCr of 1.09 mg/dL).   Assessment: 62 year old male with three vessel CAD on IV Heparin. Current plan is for CABG on Monday 06/14/17. Continuing IV Heparin until surgery.  -Heparin level remains therapeutic on 1400 units/hr.  Goal of Therapy:  Heparin level 0.3-0.7 units/ml Monitor platelets by anticoagulation protocol: Yes   Plan:  Continue heparin at 1400 units/hr. Daily HL/CBC. CABG Monday 6/10.  Harland German, PharmD Clinical Pharmacist Clinical phone from 8:30-4:00 is [phone] After 4pm, please call Main Rx [phone]) for assistance. 06/12/2017 10:38 AM

## 2017-06-12 NOTE — Progress Notes (Signed)
Progress Note  Patient Name: [name] Date of Encounter: 06/12/2017  Primary Cardiologist: Dr. [place]   Patient Profile     62 y.o. male with CAD with multiple PCIs (previous cath stable in 2016), ischemic cardiomyopathy (EF 40-45% in 2016, 38% by nuc 02/2016), HTN, HLD on Repatha, LBBB, varicose veins admitted with flash pulm edema/NSTEMI/accelerated HTN, found to have multivessel disease requiring CABG and declining EF to 20-25% by echocardiogram.  Subjective   Without chest pain or shortness of breath.  Awaiting CABG  Inpatient Medications    Scheduled Meds: . aspirin EC  81 mg Oral Daily  . carvedilol  3.125 mg Oral BID WC  . gabapentin  300 mg Oral QHS  . [START ON 06/14/2017] heparin-papaverine-plasmalyte irrigation   Irrigation To OR  . Living Better with Heart Failure Book   Does not apply Once  . [START ON 06/14/2017] magnesium sulfate  40 mEq Other To OR  . neomycin-bacitracin-polymyxin   Topical BID  . pantoprazole  20 mg Oral Daily  . [START ON 06/14/2017] potassium chloride  80 mEq Other To OR  . ramipril  1.25 mg Oral Daily  . sodium chloride flush  3 mL Intravenous Q12H  . spironolactone  25 mg Oral Daily  . [START ON 06/14/2017] tranexamic acid  15 mg/kg Intravenous To OR  . [START ON 06/14/2017] tranexamic acid  2 mg/kg Intracatheter To OR  . zolpidem  10 mg Oral QHS   Continuous Infusions: . sodium chloride    . [START ON 06/14/2017] cefUROXime (ZINACEF)  IV    . [START ON 06/14/2017] cefUROXime (ZINACEF)  IV    . [START ON 06/14/2017] dexmedetomidine    . [START ON 06/14/2017] DOPamine    . [START ON 06/14/2017] epinephrine    . [START ON 06/14/2017] heparin 30,000 units/NS 1000 mL solution for CELLSAVER    . heparin 1,400 Units/hr (06/11/17 2207)  . [START ON 06/14/2017] insulin (NOVOLIN-R) infusion    . [START ON 06/14/2017] milrinone    . [START ON 06/14/2017] nitroGLYCERIN    . [START ON 06/14/2017] phenylephrine 20mg /238mL NS (0.08mg /ml) infusion    .  [START ON 06/14/2017] tranexamic acid (CYKLOKAPRON) infusion ([facility])    . [START ON 06/14/2017] vancomycin     PRN Meds: sodium chloride, acetaminophen, morphine injection, nitroGLYCERIN, ondansetron (ZOFRAN) IV, sodium chloride flush   Vital Signs    Vitals:   06/11/17 2108 06/12/17 0529 06/12/17 0530 06/12/17 0742  BP: 118/66 125/66  137/85  Pulse: 72 74  70  Resp: 20 20  20   Temp: 98 F (36.7 C) 97.6 F (36.4 C)  (!) 97.5 F (36.4 C)  TempSrc: Oral Oral  Oral  SpO2: 100% 97%  98%  Weight:   222 lb 3.2 oz (100.8 kg)   Height:        Intake/Output Summary (Last 24 hours) at 06/12/2017 1227 Last data filed at 06/12/2017 1000 Gross per 24 hour  Intake 1042 ml  Output -  Net 1042 ml   Filed Weights   06/10/17 0500 06/11/17 0814 06/12/17 0530  Weight: 222 lb 1.6 oz (100.7 kg) 222 lb 0.1 oz (100.7 kg) 222 lb 3.2 oz (100.8 kg)    Telemetry    Personally reviewed sinus rhythm  Physical Exam   Well developed and nourished in no acute distress HENT normal Neck supple with JVP-flat Clear Regular rate and rhythm, no murmurs or gallops Abd-soft with active BS No Clubbing cyanosis edema Skin-warm and dry A & Oriented  Grossly normal sensory and motor function   Labs    Chemistry Recent Labs  Lab 06/07/17 1733  06/10/17 0337 06/11/17 0548 06/12/17 0810  NA 140   < > 139 139 140  K 3.8   < > 3.8 3.9 4.0  CL 107   < > 107 108 105  CO2 22   < > 25 25 25   GLUCOSE 159*   < > 95 95 97  BUN 24*   < > 16 17 18   CREATININE 1.22   < > 1.14 1.18 1.09  CALCIUM 9.2   < > 9.0 9.0 9.3  PROT 6.3*  --  6.2*  --   --   ALBUMIN 3.9  --  3.6  --   --   AST 31  --  22  --   --   ALT 21  --  19  --   --   ALKPHOS 35*  --  28*  --   --   BILITOT 0.7  --  0.9  --   --   GFRNONAA >60   < > >60 >60 >60  GFRAA >60   < > >60 >60 >60  ANIONGAP 11   < > 7 6 10    < > = values in this interval not displayed.     Hematology Recent Labs  Lab 06/10/17 0337 06/11/17 0548 06/12/17 0810    WBC 4.9 4.4 4.7  RBC 4.55 4.57 4.82  HGB 13.9 13.9 14.7  HCT 43.2 42.0 45.7  MCV 94.9 91.9 94.8  MCH 30.5 30.4 30.5  MCHC 32.2 33.1 32.2  RDW 13.3 13.1 13.2  PLT 246 246 260    Cardiac Enzymes Recent Labs  Lab 06/07/17 2134 06/08/17 0122 06/08/17 0720 06/08/17 1100  TROPONINI 0.21* 0.28* 0.21* 0.22*   No results for input(s): TROPIPOC in the last 168 hours.   BNP Recent Labs  Lab 06/07/17 1733  BNP 177.1*     DDimer  Recent Labs  Lab 06/07/17 1959  DDIMER 1.88*     Radiology    No results found.  Cardiac Studies    RIGHT/LEFT HEART CATH AND CORONARY ANGIOGRAPHY6/4/19  Conclusion    Prox RCA lesion is 70% stenosed.  Mid RCA lesion is 50% stenosed.  Previously placed Mid RCA to Dist RCA stent (unknown type) is widely patent.  Previously placed Dist RCA stent (unknown type) is widely patent.  Post Atrio lesion is 50% stenosed.  Ost LAD to Prox LAD lesion is 99% stenosed.  Ost 1st Diag to 1st Diag lesion is 95% stenosed.  Ost Ramus to Ramus lesion is 80% stenosed.  Ost 1st Mrg lesion is 75% stenosed.  There is severe left ventricular systolic dysfunction.  LV end diastolic pressure is normal.  The left ventricular ejection fraction is less than 25% by visual estimate.  LV end diastolic pressure is normal.  1. Severe 3 vessel obstructive CAD -99% ostial LAD with TIMI 1 flow. The vessel fills antegrade to the second diagonal. There are good right to left collaterals to the mid to distal LAD. The vessel appears large. - 95% proximal first diagonal - 80% proximal ramus intermediate - 75% large OM1 - 70% proximal RCA 2. Severe LV dysfunction. EF estimated at 20%. 3. Low LVEDP and PCWP c/w effective diuresis 4. Normal right heart pressures 5. Normal cardiac output. Index 3.28.   Plan: Recommend CT surgery consult for revascularization- message sent. Echo pending. The ostial LAD is not suitable for PCI.  Optimize medical  therapy for CHF. Will discontinue amlodipine. Start Coreg. Consider Entresto and aldactone. If pacemaker/ICD needed in the future would recommend using a left subclavian approach.    Echocardiogram 06/09/17 Study Conclusions  - Left ventricle: The cavity size was normal. Wall thickness wasincreased in a pattern of moderate LVH. Incoordinate septalmotion. Systolic function was severely reduced. The estimatedejection fraction was in the range of 20% to 25%. Diffusehypokinesis. Coarse trabeulcation / false tendonae at the LV apex - Definity contrast seen filling a network structure, without filling defect suggestive of thrombus. The study is not technically sufficient to allow evaluation of LV diastolic function. - Aortic valve: Trileaflet. Sclerosis without stenosis. There was moderate regurgitation. - Aorta: Ascending aortic diameter: 41 mm (S). - Ascending aorta: The ascending aorta was mildly dilated. - Mitral valve: Mildly thickened leaflets . There was trivial regurgitation. - Left atrium: The atrium was normal in size. - Atrial septum: No defect or patent foramen ovale was identified. - Inferior vena cava: The vessel was normal in size. The respirophasic diameter changes were in the normal range (>= 50%), consistent with normal central venous pressure.  Impressions: - Compared to a prior study in 2016, the LVEF is much lower at 20-25%. There is coarse trabeulation ./ false tendonae at the LV apex - Definity contrast opacifies the LV and fills the void up to the myocardium and therefore is negative for apical thrombus. The ascending aorta is dilated to 4.1 cm.     Assessment & Plan    1. NSTEMI/CAD -    2. Acute on chronic systolic CHF -   2. HLD - LDL 25. Continue Repatha.  4. HTN - on softer side this admission.  5. CKD stage II - CrCl 81. Baseline Cr appears 1.1-1.2, stable.   Ischemic cardiomyopathy  Left bundle branch block   For bypass  surgery Monday morning.  He has left bundle branch block and significant left ventricular dysfunction as well as congestive heart failure.  I have asked Dr. PVT to consider placing a left ventricular lead at the time of surgery.  Euvolemic continue current meds  Without symptoms of ischemia

## 2017-06-13 LAB — BASIC METABOLIC PANEL
Anion gap: 9 (ref 5–15)
BUN: 21 mg/dL — ABNORMAL HIGH (ref 6–20)
CO2: 23 mmol/L (ref 22–32)
Calcium: 9.5 mg/dL (ref 8.9–10.3)
Chloride: 107 mmol/L (ref 101–111)
Creatinine, Ser: 1.03 mg/dL (ref 0.61–1.24)
GFR calc Af Amer: >60 mL/min (ref >60)
GFR calc non Af Amer: >60 mL/min (ref >60)
Glucose, Bld: 81 mg/dL (ref 65–99)
Potassium: 4.1 mmol/L (ref 3.5–5.1)
Sodium: 139 mmol/L (ref 135–145)

## 2017-06-13 LAB — HEPARIN LEVEL (UNFRACTIONATED): Heparin Unfractionated: 0.58 IU/mL (ref 0.30–0.70)

## 2017-06-13 LAB — PREPARE RBC (CROSSMATCH)

## 2017-06-13 MED ORDER — DIPHENHYDRAMINE HCL 25 MG PO CAPS
25.0000 mg | ORAL_CAPSULE | Freq: Four times a day (QID) | ORAL | Status: DC | PRN
  Administered 2017-06-13: 25 mg via ORAL
  Filled 2017-06-13: qty 1

## 2017-06-13 MED ORDER — TEMAZEPAM 15 MG PO CAPS: 15.0000 mg | ORAL_CAPSULE | Freq: Once | ORAL | Status: DC | PRN

## 2017-06-13 MED ORDER — CHLORHEXIDINE GLUCONATE 0.12 % MT SOLN
15.0000 mL | Freq: Once | OROMUCOSAL | Status: AC
  Administered 2017-06-14: 15 mL via OROMUCOSAL
  Filled 2017-06-13: qty 15

## 2017-06-13 MED ORDER — CHLORHEXIDINE GLUCONATE 4 % EX LIQD
60.0000 mL | Freq: Once | CUTANEOUS | Status: AC
  Administered 2017-06-14: 4 via TOPICAL
  Filled 2017-06-13: qty 60

## 2017-06-13 MED ORDER — ALPRAZOLAM 0.25 MG PO TABS: 0.2500 mg | ORAL_TABLET | ORAL | Status: DC | PRN

## 2017-06-13 MED ORDER — BISACODYL 5 MG PO TBEC: 5.0000 mg | DELAYED_RELEASE_TABLET | Freq: Once | ORAL | Status: DC

## 2017-06-13 MED ORDER — DIAZEPAM 5 MG PO TABS
5.0000 mg | ORAL_TABLET | Freq: Once | ORAL | Status: AC
  Administered 2017-06-14: 5 mg via ORAL
  Filled 2017-06-13: qty 1

## 2017-06-13 MED ORDER — CHLORHEXIDINE GLUCONATE 4 % EX LIQD
60.0000 mL | Freq: Once | CUTANEOUS | Status: AC
  Administered 2017-06-13: 4 via TOPICAL
  Filled 2017-06-13: qty 60

## 2017-06-13 MED ORDER — METOPROLOL TARTRATE 12.5 MG HALF TABLET
12.5000 mg | ORAL_TABLET | Freq: Once | ORAL | Status: AC
  Administered 2017-06-14: 12.5 mg via ORAL
  Filled 2017-06-13: qty 1

## 2017-06-13 NOTE — Progress Notes (Signed)
Pt c/o rash with itching around his chin and mouth, states it began yesterday and has spread in size. Dr. Mayford Knife notified, will give benadryl, orders received to discontinue ramipril. Will continue to monitor.  Joylene Grapes, RN, BSN

## 2017-06-13 NOTE — Anesthesia Preprocedure Evaluation (Addendum)
Anesthesia Evaluation  Patient identified by MRN, date of birth, ID band Patient awake    Reviewed: Allergy & Precautions, NPO status , Patient's Chart, lab work & pertinent test results  Airway Mallampati: II  TM Distance: >3 FB Neck ROM: Full    Dental  (+) Dental Advisory Given   Pulmonary former smoker,    Pulmonary exam normal        Cardiovascular hypertension, Pt. on medications + CAD, + Past MI and +CHF  + dysrhythmias  Rhythm:Regular Rate:Normal     Neuro/Psych negative neurological ROS     GI/Hepatic negative GI ROS, Neg liver ROS, GERD  Controlled,  Endo/Other  negative endocrine ROS  Renal/GU negative Renal ROS     Musculoskeletal  (+) Arthritis , Osteoarthritis,    Abdominal   Peds  Hematology negative hematology ROS (+)   Anesthesia Other Findings   Reproductive/Obstetrics                          Echocardiogram 06/09/17 Study Conclusions  - Left ventricle: The cavity size was normal. Wall thickness wasincreased in a pattern of moderate LVH. Incoordinate septalmotion. Systolic function was severely reduced. The estimatedejection fraction was in the range of 20% to 25%. Diffusehypokinesis. Coarse trabeulcation / false tendonae at the LV apex - Definity contrast seen filling a network structure, withoutfilling defect suggestive of thrombus. The study is not technically sufficient to allow evaluation of LV diastolicfunction. - Aortic valve: Trileaflet. Sclerosis without stenosis. There wasmoderate regurgitation. - Aorta: Ascending aortic diameter: 41 mm (S). - Ascending aorta: The ascending aorta was mildly dilated. - Mitral valve: Mildly thickened leaflets . There was trivialregurgitation. - Left atrium: The atrium was normal in size. - Atrial septum: No defect or patent foramen ovale was identified. - Inferior vena cava: The vessel was normal in size.  Therespirophasic diameter changes were in the normal range (>= 50%), consistent with normal central venous pressure.  Lab Results  Component Value Date   WBC 4.7 06/12/2017   HGB 14.7 06/12/2017   HCT 45.7 06/12/2017   MCV 94.8 06/12/2017   PLT 260 06/12/2017   Lab Results  Component Value Date   CREATININE 1.03 06/13/2017   BUN 21 (H) 06/13/2017   NA 139 06/13/2017   K 4.1 06/13/2017   CL 107 06/13/2017   CO2 23 06/13/2017    Anesthesia Physical Anesthesia Plan  ASA: IV  Anesthesia Plan: General   Post-op Pain Management:    Induction: Intravenous  PONV Risk Score and Plan: 2 and Treatment may vary due to age or medical condition, Ondansetron and Dexamethasone  Airway Management Planned: Oral ETT  Additional Equipment: Arterial line, CVP, PA Cath, TEE and Ultrasound Guidance Line Placement  Intra-op Plan:   Post-operative Plan: Post-operative intubation/ventilation  Informed Consent: I have reviewed the patients History and Physical, chart, labs and discussed the procedure including the risks, benefits and alternatives for the proposed anesthesia with the patient or authorized representative who has indicated his/her understanding and acceptance.   Dental advisory given  Plan Discussed with: CRNA, Anesthesiologist and Surgeon  Anesthesia Plan Comments:       Anesthesia Quick Evaluation

## 2017-06-13 NOTE — Progress Notes (Signed)
5 Days Post-Op Procedure(s) (LRB): RIGHT/LEFT HEART CATH AND CORONARY ANGIOGRAPHY (N/A) Subjective: Patient comfortable and ready for CABG in a.m. Procedure discussed again with patient and family Plan CABG x4 with bypass grafts to LAD[totaled], diagonal, ramus, posterior descending. We will place LV epicardial lead at time of surgery in case patient needs CRT in future- cardiac resynchronized therapy Objective: Vital signs in last 24 hours: Temp:  [97.4 F (36.3 C)-98 F (36.7 C)] 98 F (36.7 C) (06/09 1644) Pulse Rate:  [65-77] 71 (06/09 1644) Cardiac Rhythm: Bundle branch block;Heart block (06/09 0815) Resp:  [18-20] 20 (06/09 1644) BP: (97-132)/(67-78) 132/73 (06/09 1644) SpO2:  [95 %-99 %] 98 % (06/09 1644) Weight:  [222 lb 6.4 oz (100.9 kg)] 222 lb 6.4 oz (100.9 kg) (06/09 0423)  Hemodynamic parameters for last 24 hours:  Sinus with left bundle  Intake/Output from previous day: 06/08 0701 - 06/09 0700 In: 1450 [P.O.:960; I.V.:490] Out: -  Intake/Output this shift: Total I/O In: 394 [P.O.:240; I.V.:154] Out: -   No bleeding from cath site No edema Lab Results: Recent Labs    06/11/17 0548 06/12/17 0810  WBC 4.4 4.7  HGB 13.9 14.7  HCT 42.0 45.7  PLT 246 260   BMET:  Recent Labs    06/12/17 0810 06/13/17 0355  NA 140 139  K 4.0 4.1  CL 105 107  CO2 25 23  GLUCOSE 97 81  BUN 18 21*  CREATININE 1.09 1.03  CALCIUM 9.3 9.5    PT/INR: No results for input(s): LABPROT, INR in the last 72 hours. ABG    Component Value Date/Time   HCO3 26.1 06/08/2017 1713   TCO2 27 06/08/2017 1713   O2SAT 73.0 06/08/2017 1713   CBG (last 3)  No results for input(s): GLUCAP in the last 72 hours.  Assessment/Plan: S/P Procedure(s) (LRB): RIGHT/LEFT HEART CATH AND CORONARY ANGIOGRAPHY (N/A) Multivessel CABG in a.m.   LOS: 5 days    [name] 06/13/2017

## 2017-06-13 NOTE — Progress Notes (Signed)
ANTICOAGULATION CONSULT NOTE - Follow Up Consult  Pharmacy Consult for Heparin Indication: 3VCAD  Allergies  Allergen Reactions  . Ezetimibe Other (See Comments)    Joint pain and drops BP  . Niacin Other (See Comments)    Drops BP and severe  . Statins Other (See Comments)    Severe joint pain and drops BP.    Patient Measurements: Height: 6\' 2"  (188 cm) Weight: 222 lb 6.4 oz (100.9 kg) IBW/kg (Calculated) : 82.2 Heparin Dosing Weight:  101.2 kg  Vital Signs: Temp: 97.6 F (36.4 C) (06/09 0421) Temp Source: Oral (06/09 0421) BP: 132/73 (06/09 0421) Pulse Rate: 66 (06/09 0421)  Labs: Recent Labs    06/11/17 0548 06/12/17 0810 06/13/17 0355  HGB 13.9 14.7  --   HCT 42.0 45.7  --   PLT 246 260  --   HEPARINUNFRC 0.55 0.64 0.58  CREATININE 1.18 1.09 1.03    Estimated Creatinine Clearance: 93.1 mL/min (by C-G formula based on SCr of 1.03 mg/dL).   Assessment: 61 year old male with three vessel CAD on IV Heparin. Current plan is for CABG on Monday 06/14/17. Continuing IV Heparin until surgery.  -Heparin level remains therapeutic this morning on 1400 units/hr. Noted no concerns with bleeding or issues with heparin drip overnight.   Goal of Therapy:  Heparin level 0.3-0.7 units/ml Monitor platelets by anticoagulation protocol: Yes   Plan:  Continue heparin at 1400 units/hr Daily HL/CBC, monitor for s/sx of bleeding Planned CABG Monday 6/10  [facility], [place].D. PGY1 Pharmacy Resident 06/13/2017 7:16 AM Phone: [phone]

## 2017-06-13 NOTE — Progress Notes (Signed)
Of note, pt states he was taking ramipril prior to admission. Pt states he thinks he may be sensitive to the detergent used for hospital linens. Benadryl given, will continue to monitor.    Joylene Grapes, RN, BSN 06/13/2017 6:26 PM

## 2017-06-13 NOTE — Progress Notes (Signed)
Progress Note  Patient Name: [name] Date of Encounter: 06/13/2017  Primary Cardiologist: Dr. [place]   Patient Profile     62 y.o. male with CAD with multiple PCIs (previous cath stable in 2016), ischemic cardiomyopathy (EF 40-45% in 2016, 38% by nuc 02/2016), HTN, HLD on Repatha, LBBB, varicose veins admitted with flash pulm edema/NSTEMI/accelerated HTN, found to have multivessel disease requiring CABG and declining EF to 20-25% by echocardiogram.  Subjective   Without chest pain or shortness of breath.  bored Awaiting CABG.  Inpatient Medications    Scheduled Meds: . aspirin EC  81 mg Oral Daily  . carvedilol  3.125 mg Oral BID WC  . gabapentin  300 mg Oral QHS  . [START ON 06/14/2017] heparin-papaverine-plasmalyte irrigation   Irrigation To OR  . Living Better with Heart Failure Book   Does not apply Once  . [START ON 06/14/2017] magnesium sulfate  40 mEq Other To OR  . neomycin-bacitracin-polymyxin   Topical BID  . pantoprazole  20 mg Oral Daily  . [START ON 06/14/2017] potassium chloride  80 mEq Other To OR  . ramipril  1.25 mg Oral Daily  . sodium chloride flush  3 mL Intravenous Q12H  . spironolactone  25 mg Oral Daily  . [START ON 06/14/2017] tranexamic acid  15 mg/kg Intravenous To OR  . [START ON 06/14/2017] tranexamic acid  2 mg/kg Intracatheter To OR  . zolpidem  10 mg Oral QHS   Continuous Infusions: . sodium chloride    . [START ON 06/14/2017] cefUROXime (ZINACEF)  IV    . [START ON 06/14/2017] cefUROXime (ZINACEF)  IV    . [START ON 06/14/2017] dexmedetomidine    . [START ON 06/14/2017] DOPamine    . [START ON 06/14/2017] epinephrine    . [START ON 06/14/2017] heparin 30,000 units/NS 1000 mL solution for CELLSAVER    . heparin 1,400 Units/hr (06/12/17 1915)  . [START ON 06/14/2017] insulin (NOVOLIN-R) infusion    . [START ON 06/14/2017] milrinone    . [START ON 06/14/2017] nitroGLYCERIN    . [START ON 06/14/2017] phenylephrine /237mL NS (0.08mg /ml) infusion     . [START ON 06/14/2017] tranexamic acid (CYKLOKAPRON) infusion ([facility])    . [START ON 06/14/2017] vancomycin     PRN Meds: sodium chloride, acetaminophen, morphine injection, nitroGLYCERIN, ondansetron (ZOFRAN) IV, sodium chloride flush   Vital Signs    Vitals:   06/13/17 0016 06/13/17 0421 06/13/17 0423 06/13/17 0740  BP: 97/67 132/73  125/71  Pulse: 77 66  65  Resp: 18   20  Temp:  97.6 F (36.4 C)  (!) 97.4 F (36.3 C)  TempSrc:  Oral  Oral  SpO2: 95% 97%  99%  Weight:   222 lb 6.4 oz (100.9 kg)   Height:        Intake/Output Summary (Last 24 hours) at 06/13/2017 1136 Last data filed at 06/13/2017 1000 Gross per 24 hour  Intake 1128 ml  Output -  Net 1128 ml   Filed Weights   06/11/17 0814 06/12/17 0530 06/13/17 0423  Weight: 222 lb 0.1 oz (100.7 kg) 222 lb 3.2 oz (100.8 kg) 222 lb 6.4 oz (100.9 kg)    Telemetry    Telemetry Personally reviewed  sinus  Physical Exam   Well developed and nourished in no acute distress HENT normal Neck supple with JVP-flat Clear Regular rate and rhythm, no murmurs or gallops Abd-soft with active BS No Clubbing cyanosis edema Skin-warm and dry A & Oriented  Grossly normal sensory and motor function   Labs    Chemistry Recent Labs  Lab 06/07/17 1733  06/10/17 0337 06/11/17 0548 06/12/17 0810 06/13/17 0355  NA 140   < > 139 139 140 139  K 3.8   < > 3.8 3.9 4.0 4.1  CL 107   < > 107 108 105 107  CO2 22   < > 25 25 25 23   GLUCOSE 159*   < > 95 95 97 81  BUN 24*   < > 16 17 18  21*  CREATININE 1.22   < > 1.14 1.18 1.09 1.03  CALCIUM 9.2   < > 9.0 9.0 9.3 9.5  PROT 6.3*  --  6.2*  --   --   --   ALBUMIN 3.9  --  3.6  --   --   --   AST 31  --  22  --   --   --   ALT 21  --  19  --   --   --   ALKPHOS 35*  --  28*  --   --   --   BILITOT 0.7  --  0.9  --   --   --   GFRNONAA >60   < > >60 >60 >60 >60  GFRAA >60   < > >60 >60 >60 >60  ANIONGAP 11   < > 7 6 10 9    < > = values in this interval not displayed.      Hematology Recent Labs  Lab 06/10/17 0337 06/11/17 0548 06/12/17 0810  WBC 4.9 4.4 4.7  RBC 4.55 4.57 4.82  HGB 13.9 13.9 14.7  HCT 43.2 42.0 45.7  MCV 94.9 91.9 94.8  MCH 30.5 30.4 30.5  MCHC 32.2 33.1 32.2  RDW 13.3 13.1 13.2  PLT 246 246 260    Cardiac Enzymes Recent Labs  Lab 06/07/17 2134 06/08/17 0122 06/08/17 0720 06/08/17 1100  TROPONINI 0.21* 0.28* 0.21* 0.22*   No results for input(s): TROPIPOC in the last 168 hours.   BNP Recent Labs  Lab 06/07/17 1733  BNP 177.1*     DDimer  Recent Labs  Lab 06/07/17 1959  DDIMER 1.88*     Radiology    No results found.  Cardiac Studies    RIGHT/LEFT HEART CATH AND CORONARY ANGIOGRAPHY6/4/19  Conclusion    Prox RCA lesion is 70% stenosed.  Mid RCA lesion is 50% stenosed.  Previously placed Mid RCA to Dist RCA stent (unknown type) is widely patent.  Previously placed Dist RCA stent (unknown type) is widely patent.  Post Atrio lesion is 50% stenosed.  Ost LAD to Prox LAD lesion is 99% stenosed.  Ost 1st Diag to 1st Diag lesion is 95% stenosed.  Ost Ramus to Ramus lesion is 80% stenosed.  Ost 1st Mrg lesion is 75% stenosed.  There is severe left ventricular systolic dysfunction.  LV end diastolic pressure is normal.  The left ventricular ejection fraction is less than 25% by visual estimate.  LV end diastolic pressure is normal.  1. Severe 3 vessel obstructive CAD -99% ostial LAD with TIMI 1 flow. The vessel fills antegrade to the second diagonal. There are good right to left collaterals to the mid to distal LAD. The vessel appears large. - 95% proximal first diagonal - 80% proximal ramus intermediate - 75% large OM1 - 70% proximal RCA 2. Severe LV dysfunction. EF estimated at 20%. 3. Low LVEDP and PCWP c/w effective diuresis 4. Normal  right heart pressures 5. Normal cardiac output. Index 3.28.   Plan: Recommend CT surgery consult for revascularization- message  sent. Echo pending. The ostial LAD is not suitable for PCI. Optimize medical therapy for CHF. Will discontinue amlodipine. Start Coreg. Consider Entresto and aldactone. If pacemaker/ICD needed in the future would recommend using a left subclavian approach.    Echocardiogram 06/09/17 Study Conclusions  - Left ventricle: The cavity size was normal. Wall thickness wasincreased in a pattern of moderate LVH. Incoordinate septalmotion. Systolic function was severely reduced. The estimatedejection fraction was in the range of 20% to 25%. Diffusehypokinesis. Coarse trabeulcation / false tendonae at the LV apex - Definity contrast seen filling a network structure, without filling defect suggestive of thrombus. The study is not technically sufficient to allow evaluation of LV diastolic function. - Aortic valve: Trileaflet. Sclerosis without stenosis. There was moderate regurgitation. - Aorta: Ascending aortic diameter: 41 mm (S). - Ascending aorta: The ascending aorta was mildly dilated. - Mitral valve: Mildly thickened leaflets . There was trivial regurgitation. - Left atrium: The atrium was normal in size. - Atrial septum: No defect or patent foramen ovale was identified. - Inferior vena cava: The vessel was normal in size. The respirophasic diameter changes were in the normal range (>= 50%), consistent with normal central venous pressure.  Impressions: - Compared to a prior study in 2016, the LVEF is much lower at 20-25%. There is coarse trabeulation ./ false tendonae at the LV apex - Definity contrast opacifies the LV and fills the void up to the myocardium and therefore is negative for apical thrombus. The ascending aorta is dilated to 4.1 cm.     Assessment & Plan    1. NSTEMI/CAD -    2. Acute on chronic systolic CHF -   2. HLD - LDL 25. Continue Repatha.  4. HTN - on softer side this admission.  5. CKD stage II - CrCl 81. Baseline Cr appears 1.1-1.2,  stable.   Ischemic cardiomyopathy  Left bundle branch block   Without symptoms of ischemia  Euvolemic continue current meds  On Anticoagulation;  No bleeding issues    For CABG and LV lead placmeent

## 2017-06-14 ENCOUNTER — Inpatient Hospital Stay (HOSPITAL_COMMUNITY)
Admission: EM | Disposition: A | Discharge status: Home / Self Care | Payer: Self-pay | Source: Home / Self Care | Attending: Cardiothoracic Surgery

## 2017-06-14 ENCOUNTER — Inpatient Hospital Stay (HOSPITAL_COMMUNITY): Payer: BLUE CROSS/BLUE SHIELD

## 2017-06-14 ENCOUNTER — Encounter (HOSPITAL_COMMUNITY): Payer: Self-pay | Admitting: Certified Registered Nurse Anesthetist

## 2017-06-14 ENCOUNTER — Inpatient Hospital Stay (HOSPITAL_COMMUNITY): Payer: BLUE CROSS/BLUE SHIELD | Admitting: Anesthesiology

## 2017-06-14 DIAGNOSIS — Z951 Presence of aortocoronary bypass graft: Secondary | ICD-10-CM

## 2017-06-14 HISTORY — PX: CYSTOSCOPY: SHX5120

## 2017-06-14 HISTORY — PX: CORONARY ARTERY BYPASS GRAFT: SHX141

## 2017-06-14 HISTORY — PX: INSERTION OF SUPRAPUBIC CATHETER: SHX5870

## 2017-06-14 HISTORY — PX: TEE WITHOUT CARDIOVERSION: SHX5443

## 2017-06-14 LAB — POCT I-STAT 3, ART BLOOD GAS (G3+)
Acid-Base Excess: 1 mmol/L (ref 0.0–2.0)
Acid-base deficit: 3 mmol/L — ABNORMAL HIGH (ref 0.0–2.0)
Acid-base deficit: 2 mmol/L (ref 0.0–2.0)
Acid-base deficit: 3 mmol/L — ABNORMAL HIGH (ref 0.0–2.0)
Acid-base deficit: 4 mmol/L — ABNORMAL HIGH (ref 0.0–2.0)
Bicarbonate: 27.1 mmol/L (ref 20.0–28.0)
Bicarbonate: 23 mmol/L (ref 20.0–28.0)
Bicarbonate: 25 mmol/L (ref 20.0–28.0)
Bicarbonate: 23.6 mmol/L (ref 20.0–28.0)
Bicarbonate: 22.5 mmol/L (ref 20.0–28.0)
O2 Saturation: 100 %
O2 Saturation: 99 %
O2 Saturation: 97 %
O2 Saturation: 99 %
O2 Saturation: 94 %
Patient temperature: 37.1
Patient temperature: 36.8
Patient temperature: 37.2
TCO2: 29 mmol/L (ref 22–32)
TCO2: 24 mmol/L (ref 22–32)
TCO2: 27 mmol/L (ref 22–32)
TCO2: 25 mmol/L (ref 22–32)
TCO2: 24 mmol/L (ref 22–32)
pCO2 arterial: 50.8 mmHg — ABNORMAL HIGH (ref 32.0–48.0)
pCO2 arterial: 46.1 mmHg (ref 32.0–48.0)
pCO2 arterial: 52.3 mmHg — ABNORMAL HIGH (ref 32.0–48.0)
pCO2 arterial: 48.3 mmHg — ABNORMAL HIGH (ref 32.0–48.0)
pCO2 arterial: 44.3 mmHg (ref 32.0–48.0)
pH, Arterial: 7.335 — ABNORMAL LOW (ref 7.350–7.450)
pH, Arterial: 7.305 — ABNORMAL LOW (ref 7.350–7.450)
pH, Arterial: 7.288 — ABNORMAL LOW (ref 7.350–7.450)
pH, Arterial: 7.295 — ABNORMAL LOW (ref 7.350–7.450)
pH, Arterial: 7.314 — ABNORMAL LOW (ref 7.350–7.450)
pO2, Arterial: 395 mmHg — ABNORMAL HIGH (ref 83.0–108.0)
pO2, Arterial: 141 mmHg — ABNORMAL HIGH (ref 83.0–108.0)
pO2, Arterial: 105 mmHg (ref 83.0–108.0)
pO2, Arterial: 137 mmHg — ABNORMAL HIGH (ref 83.0–108.0)
pO2, Arterial: 78 mmHg — ABNORMAL LOW (ref 83.0–108.0)

## 2017-06-14 LAB — GLUCOSE, CAPILLARY
Glucose-Capillary: 110 mg/dL — ABNORMAL HIGH (ref 65–99)
Glucose-Capillary: 148 mg/dL — ABNORMAL HIGH (ref 65–99)
Glucose-Capillary: 148 mg/dL — ABNORMAL HIGH (ref 65–99)
Glucose-Capillary: 140 mg/dL — ABNORMAL HIGH (ref 65–99)
Glucose-Capillary: 147 mg/dL — ABNORMAL HIGH (ref 65–99)
Glucose-Capillary: 146 mg/dL — ABNORMAL HIGH (ref 65–99)

## 2017-06-14 LAB — CBC
HCT: 44.3 % (ref 39.0–52.0)
HCT: 36 % — ABNORMAL LOW (ref 39.0–52.0)
HCT: 29.1 % — ABNORMAL LOW (ref 39.0–52.0)
Hemoglobin: 14.5 g/dL (ref 13.0–17.0)
Hemoglobin: 11.9 g/dL — ABNORMAL LOW (ref 13.0–17.0)
Hemoglobin: 9.6 g/dL — ABNORMAL LOW (ref 13.0–17.0)
MCH: 30.6 pg (ref 26.0–34.0)
MCH: 31.1 pg (ref 26.0–34.0)
MCH: 31 pg (ref 26.0–34.0)
MCHC: 32.7 g/dL (ref 30.0–36.0)
MCHC: 33.1 g/dL (ref 30.0–36.0)
MCHC: 33 g/dL (ref 30.0–36.0)
MCV: 93.5 fL (ref 78.0–100.0)
MCV: 94 fL (ref 78.0–100.0)
MCV: 93.9 fL (ref 78.0–100.0)
Platelets: 240 10*3/uL (ref 150–400)
Platelets: 226 10*3/uL (ref 150–400)
Platelets: 151 10*3/uL (ref 150–400)
RBC: 4.74 MIL/uL (ref 4.22–5.81)
RBC: 3.83 MIL/uL — ABNORMAL LOW (ref 4.22–5.81)
RBC: 3.1 MIL/uL — ABNORMAL LOW (ref 4.22–5.81)
RDW: 13.3 % (ref 11.5–15.5)
RDW: 13.4 % (ref 11.5–15.5)
RDW: 13.4 % (ref 11.5–15.5)
WBC: 4.9 10*3/uL (ref 4.0–10.5)
WBC: 13.2 10*3/uL — ABNORMAL HIGH (ref 4.0–10.5)
WBC: 8.9 10*3/uL (ref 4.0–10.5)

## 2017-06-14 LAB — POCT I-STAT, CHEM 8
BUN: 21 mg/dL — ABNORMAL HIGH (ref 6–20)
BUN: 20 mg/dL (ref 6–20)
BUN: 18 mg/dL (ref 6–20)
BUN: 20 mg/dL (ref 6–20)
BUN: 20 mg/dL (ref 6–20)
BUN: 21 mg/dL — ABNORMAL HIGH (ref 6–20)
Calcium, Ion: 1.31 mmol/L (ref 1.15–1.40)
Calcium, Ion: 1.29 mmol/L (ref 1.15–1.40)
Calcium, Ion: 1.12 mmol/L — ABNORMAL LOW (ref 1.15–1.40)
Calcium, Ion: 1.16 mmol/L (ref 1.15–1.40)
Calcium, Ion: 1.16 mmol/L (ref 1.15–1.40)
Calcium, Ion: 1.2 mmol/L (ref 1.15–1.40)
Chloride: 103 mmol/L (ref 101–111)
Chloride: 103 mmol/L (ref 101–111)
Chloride: 100 mmol/L — ABNORMAL LOW (ref 101–111)
Chloride: 101 mmol/L (ref 101–111)
Chloride: 100 mmol/L — ABNORMAL LOW (ref 101–111)
Chloride: 103 mmol/L (ref 101–111)
Creatinine, Ser: 0.8 mg/dL (ref 0.61–1.24)
Creatinine, Ser: 0.9 mg/dL (ref 0.61–1.24)
Creatinine, Ser: 0.8 mg/dL (ref 0.61–1.24)
Creatinine, Ser: 0.7 mg/dL (ref 0.61–1.24)
Creatinine, Ser: 0.9 mg/dL (ref 0.61–1.24)
Creatinine, Ser: 1 mg/dL (ref 0.61–1.24)
Glucose, Bld: 93 mg/dL (ref 65–99)
Glucose, Bld: 99 mg/dL (ref 65–99)
Glucose, Bld: 95 mg/dL (ref 65–99)
Glucose, Bld: 129 mg/dL — ABNORMAL HIGH (ref 65–99)
Glucose, Bld: 150 mg/dL — ABNORMAL HIGH (ref 65–99)
Glucose, Bld: 168 mg/dL — ABNORMAL HIGH (ref 65–99)
HCT: 38 % — ABNORMAL LOW (ref 39.0–52.0)
HCT: 35 % — ABNORMAL LOW (ref 39.0–52.0)
HCT: 30 % — ABNORMAL LOW (ref 39.0–52.0)
HCT: 31 % — ABNORMAL LOW (ref 39.0–52.0)
HCT: 31 % — ABNORMAL LOW (ref 39.0–52.0)
HCT: 33 % — ABNORMAL LOW (ref 39.0–52.0)
Hemoglobin: 12.9 g/dL — ABNORMAL LOW (ref 13.0–17.0)
Hemoglobin: 11.9 g/dL — ABNORMAL LOW (ref 13.0–17.0)
Hemoglobin: 10.2 g/dL — ABNORMAL LOW (ref 13.0–17.0)
Hemoglobin: 10.5 g/dL — ABNORMAL LOW (ref 13.0–17.0)
Hemoglobin: 10.5 g/dL — ABNORMAL LOW (ref 13.0–17.0)
Hemoglobin: 11.2 g/dL — ABNORMAL LOW (ref 13.0–17.0)
Potassium: 4 mmol/L (ref 3.5–5.1)
Potassium: 4.5 mmol/L (ref 3.5–5.1)
Potassium: 4.6 mmol/L (ref 3.5–5.1)
Potassium: 5 mmol/L (ref 3.5–5.1)
Potassium: 6 mmol/L — ABNORMAL HIGH (ref 3.5–5.1)
Potassium: 4.9 mmol/L (ref 3.5–5.1)
Sodium: 140 mmol/L (ref 135–145)
Sodium: 139 mmol/L (ref 135–145)
Sodium: 140 mmol/L (ref 135–145)
Sodium: 136 mmol/L (ref 135–145)
Sodium: 135 mmol/L (ref 135–145)
Sodium: 138 mmol/L (ref 135–145)
TCO2: 26 mmol/L (ref 22–32)
TCO2: 27 mmol/L (ref 22–32)
TCO2: 28 mmol/L (ref 22–32)
TCO2: 27 mmol/L (ref 22–32)
TCO2: 25 mmol/L (ref 22–32)
TCO2: 23 mmol/L (ref 22–32)

## 2017-06-14 LAB — BASIC METABOLIC PANEL
Anion gap: 10 (ref 5–15)
BUN: 22 mg/dL — ABNORMAL HIGH (ref 6–20)
CO2: 25 mmol/L (ref 22–32)
Calcium: 9.4 mg/dL (ref 8.9–10.3)
Chloride: 105 mmol/L (ref 101–111)
Creatinine, Ser: 1.16 mg/dL (ref 0.61–1.24)
GFR calc Af Amer: >60 mL/min (ref >60)
GFR calc non Af Amer: >60 mL/min (ref >60)
Glucose, Bld: 81 mg/dL (ref 65–99)
Potassium: 4.1 mmol/L (ref 3.5–5.1)
Sodium: 140 mmol/L (ref 135–145)

## 2017-06-14 LAB — PLATELET COUNT: Platelets: 200 10*3/uL (ref 150–400)

## 2017-06-14 LAB — CREATININE, SERUM
Creatinine, Ser: 1.24 mg/dL (ref 0.61–1.24)
GFR calc Af Amer: >60 mL/min (ref >60)
GFR calc non Af Amer: >60 mL/min (ref >60)

## 2017-06-14 LAB — POCT I-STAT 4, (NA,K, GLUC, HGB,HCT)
Glucose, Bld: 146 mg/dL — ABNORMAL HIGH (ref 65–99)
HCT: 35 % — ABNORMAL LOW (ref 39.0–52.0)
Hemoglobin: 11.9 g/dL — ABNORMAL LOW (ref 13.0–17.0)
Potassium: 4.6 mmol/L (ref 3.5–5.1)
Sodium: 139 mmol/L (ref 135–145)

## 2017-06-14 LAB — HEMOGLOBIN AND HEMATOCRIT, BLOOD
HCT: 31.8 % — ABNORMAL LOW (ref 39.0–52.0)
Hemoglobin: 10.5 g/dL — ABNORMAL LOW (ref 13.0–17.0)

## 2017-06-14 LAB — HEPARIN LEVEL (UNFRACTIONATED): Heparin Unfractionated: 0.69 IU/mL (ref 0.30–0.70)

## 2017-06-14 LAB — APTT: aPTT: 56 seconds — ABNORMAL HIGH (ref 24–36)

## 2017-06-14 LAB — PROTIME-INR
INR: 1.28
Prothrombin Time: 15.9 seconds — ABNORMAL HIGH (ref 11.4–15.2)

## 2017-06-14 LAB — MAGNESIUM: Magnesium: 2.6 mg/dL — ABNORMAL HIGH (ref 1.7–2.4)

## 2017-06-14 SURGERY — CORONARY ARTERY BYPASS GRAFTING (CABG)
Anesthesia: General | Site: Penis

## 2017-06-14 MED ORDER — FENTANYL CITRATE (PF) 250 MCG/5ML IJ SOLN
INTRAMUSCULAR | Status: DC | PRN
  Administered 2017-06-14: 50 ug via INTRAVENOUS
  Administered 2017-06-14: 150 ug via INTRAVENOUS
  Administered 2017-06-14: 50 ug via INTRAVENOUS
  Administered 2017-06-14: 100 ug via INTRAVENOUS
  Administered 2017-06-14: 450 ug via INTRAVENOUS
  Administered 2017-06-14 (×2): 100 ug via INTRAVENOUS

## 2017-06-14 MED ORDER — HEMOSTATIC AGENTS (NO CHARGE) OPTIME
TOPICAL | Status: DC | PRN
  Administered 2017-06-14 (×4): 1 via TOPICAL

## 2017-06-14 MED ORDER — TRAMADOL HCL 50 MG PO TABS
50.0000 mg | ORAL_TABLET | ORAL | Status: DC | PRN
  Administered 2017-06-17 – 2017-06-18 (×2): 100 mg via ORAL
  Filled 2017-06-14 (×2): qty 2

## 2017-06-14 MED ORDER — HEPARIN SODIUM (PORCINE) 1000 UNIT/ML IJ SOLN
INTRAMUSCULAR | Status: AC
  Filled 2017-06-14: qty 1

## 2017-06-14 MED ORDER — HEPARIN SODIUM (PORCINE) 1000 UNIT/ML IJ SOLN
INTRAMUSCULAR | Status: DC | PRN
  Administered 2017-06-14: 33000 [IU] via INTRAVENOUS
  Administered 2017-06-14: 2000 [IU] via INTRAVENOUS

## 2017-06-14 MED ORDER — ROCURONIUM BROMIDE 10 MG/ML (PF) SYRINGE
PREFILLED_SYRINGE | INTRAVENOUS | Status: AC
  Filled 2017-06-14: qty 10

## 2017-06-14 MED ORDER — LIDOCAINE HCL (CARDIAC) PF 100 MG/5ML IV SOSY
PREFILLED_SYRINGE | INTRAVENOUS | Status: DC | PRN
  Administered 2017-06-14: 100 mg via INTRAVENOUS

## 2017-06-14 MED ORDER — NOREPINEPHRINE 4 MG/250ML-% IV SOLN
2.0000 ug/min | INTRAVENOUS | Status: DC
  Administered 2017-06-15: 9 ug/min via INTRAVENOUS
  Filled 2017-06-14 (×2): qty 250

## 2017-06-14 MED ORDER — METOPROLOL TARTRATE 25 MG/10 ML ORAL SUSPENSION: 12.5000 mg | Freq: Two times a day (BID) | ORAL | Status: DC

## 2017-06-14 MED ORDER — INSULIN REGULAR BOLUS VIA INFUSION
0.0000 [IU] | Freq: Three times a day (TID) | INTRAVENOUS | Status: DC
  Filled 2017-06-14: qty 10

## 2017-06-14 MED ORDER — LIDOCAINE 2% (20 MG/ML) 5 ML SYRINGE
INTRAMUSCULAR | Status: AC
  Filled 2017-06-14: qty 5

## 2017-06-14 MED ORDER — ALBUMIN HUMAN 5 % IV SOLN
INTRAVENOUS | Status: DC | PRN
  Administered 2017-06-14: 15:00:00 via INTRAVENOUS

## 2017-06-14 MED ORDER — CHLORHEXIDINE GLUCONATE CLOTH 2 % EX PADS
6.0000 | MEDICATED_PAD | Freq: Every day | CUTANEOUS | Status: DC
  Administered 2017-06-14 – 2017-06-21 (×8): 6 via TOPICAL

## 2017-06-14 MED ORDER — KETOROLAC TROMETHAMINE 15 MG/ML IJ SOLN
15.0000 mg | Freq: Four times a day (QID) | INTRAMUSCULAR | Status: AC | PRN
  Administered 2017-06-14 – 2017-06-15 (×4): 15 mg via INTRAVENOUS
  Filled 2017-06-14 (×4): qty 1

## 2017-06-14 MED ORDER — PHENYLEPHRINE HCL 10 MG/ML IJ SOLN
INTRAMUSCULAR | Status: DC | PRN
  Administered 2017-06-14: 120 ug via INTRAVENOUS
  Administered 2017-06-14: 80 ug via INTRAVENOUS

## 2017-06-14 MED ORDER — CHLORHEXIDINE GLUCONATE 0.12 % MT SOLN
15.0000 mL | OROMUCOSAL | Status: AC
  Administered 2017-06-14: 15 mL via OROMUCOSAL
  Filled 2017-06-14: qty 15

## 2017-06-14 MED ORDER — HEMOSTATIC AGENTS (NO CHARGE) OPTIME
TOPICAL | Status: DC | PRN
  Administered 2017-06-14: 1 via TOPICAL

## 2017-06-14 MED ORDER — SODIUM CHLORIDE 0.9 % IV SOLN
INTRAVENOUS | Status: DC | PRN
  Administered 2017-06-14: 14:00:00 via INTRAVENOUS

## 2017-06-14 MED ORDER — PROPOFOL 10 MG/ML IV BOLUS
INTRAVENOUS | Status: AC
  Filled 2017-06-14: qty 20

## 2017-06-14 MED ORDER — PROTAMINE SULFATE 10 MG/ML IV SOLN
INTRAVENOUS | Status: AC
  Filled 2017-06-14: qty 20

## 2017-06-14 MED ORDER — METOPROLOL TARTRATE 5 MG/5ML IV SOLN: 2.5000 mg | INTRAVENOUS | Status: DC | PRN

## 2017-06-14 MED ORDER — LACTATED RINGERS IV SOLN
INTRAVENOUS | Status: DC
  Administered 2017-06-14: 17:00:00 via INTRAVENOUS

## 2017-06-14 MED ORDER — ASPIRIN 81 MG PO CHEW
324.0000 mg | CHEWABLE_TABLET | Freq: Every day | ORAL | Status: DC
  Administered 2017-06-17: 324 mg
  Filled 2017-06-14: qty 4

## 2017-06-14 MED ORDER — VANCOMYCIN HCL IN DEXTROSE 1-5 GM/200ML-% IV SOLN
1000.0000 mg | Freq: Two times a day (BID) | INTRAVENOUS | Status: AC
  Administered 2017-06-14 – 2017-06-15 (×2): 1000 mg via INTRAVENOUS
  Filled 2017-06-14 (×2): qty 200

## 2017-06-14 MED ORDER — VANCOMYCIN HCL IN DEXTROSE 1-5 GM/200ML-% IV SOLN
1000.0000 mg | Freq: Once | INTRAVENOUS | Status: DC
  Filled 2017-06-14: qty 200

## 2017-06-14 MED ORDER — SUCCINYLCHOLINE CHLORIDE 200 MG/10ML IV SOSY
PREFILLED_SYRINGE | INTRAVENOUS | Status: AC
  Filled 2017-06-14: qty 10

## 2017-06-14 MED ORDER — 0.9 % SODIUM CHLORIDE (POUR BTL) OPTIME
TOPICAL | Status: DC | PRN
  Administered 2017-06-14: 6000 mL

## 2017-06-14 MED ORDER — SODIUM CHLORIDE 0.9% FLUSH: 10.0000 mL | INTRAVENOUS | Status: DC | PRN

## 2017-06-14 MED ORDER — SODIUM CHLORIDE 0.9 % IV SOLN: 250.0000 mL | INTRAVENOUS | Status: DC

## 2017-06-14 MED ORDER — ROCURONIUM BROMIDE 100 MG/10ML IV SOLN
INTRAVENOUS | Status: DC | PRN
  Administered 2017-06-14: 30 mg via INTRAVENOUS
  Administered 2017-06-14 (×6): 50 mg via INTRAVENOUS
  Administered 2017-06-14: 70 mg via INTRAVENOUS

## 2017-06-14 MED ORDER — NOREPINEPHRINE 4 MG/250ML-% IV SOLN
0.0000 ug/min | INTRAVENOUS | Status: DC
  Administered 2017-06-14: 5 ug/min via INTRAVENOUS
  Filled 2017-06-14: qty 250

## 2017-06-14 MED ORDER — LACTATED RINGERS IV SOLN
INTRAVENOUS | Status: DC | PRN
  Administered 2017-06-14 (×2): via INTRAVENOUS

## 2017-06-14 MED ORDER — TRANEXAMIC ACID 1000 MG/10ML IV SOLN
INTRAVENOUS | Status: DC | PRN
  Administered 2017-06-14: 1.5 mg/kg/h via INTRAVENOUS

## 2017-06-14 MED ORDER — ALBUMIN HUMAN 5 % IV SOLN
250.0000 mL | INTRAVENOUS | Status: AC | PRN
  Administered 2017-06-14 (×5): 250 mL via INTRAVENOUS
  Filled 2017-06-14 (×4): qty 250

## 2017-06-14 MED ORDER — PHENYLEPHRINE HCL 10 MG/ML IJ SOLN
0.0000 ug/min | INTRAMUSCULAR | Status: DC
  Filled 2017-06-14: qty 2

## 2017-06-14 MED ORDER — PROTAMINE SULFATE 10 MG/ML IV SOLN
INTRAVENOUS | Status: AC
  Filled 2017-06-14: qty 25

## 2017-06-14 MED ORDER — BISACODYL 10 MG RE SUPP: 10.0000 mg | Freq: Every day | RECTAL | Status: DC

## 2017-06-14 MED ORDER — METOPROLOL TARTRATE 12.5 MG HALF TABLET
12.5000 mg | ORAL_TABLET | Freq: Two times a day (BID) | ORAL | Status: DC
  Administered 2017-06-15 – 2017-06-17 (×4): 12.5 mg via ORAL
  Filled 2017-06-14 (×4): qty 1

## 2017-06-14 MED ORDER — POTASSIUM CHLORIDE 10 MEQ/50ML IV SOLN: 10.0000 meq | INTRAVENOUS | Status: AC

## 2017-06-14 MED ORDER — ACETAMINOPHEN 650 MG RE SUPP
650.0000 mg | Freq: Once | RECTAL | Status: AC
  Administered 2017-06-14: 650 mg via RECTAL

## 2017-06-14 MED ORDER — SODIUM CHLORIDE 0.9 % IV SOLN
20.0000 ug | INTRAVENOUS | Status: AC
  Administered 2017-06-14: 20 ug via INTRAVENOUS
  Filled 2017-06-14: qty 5

## 2017-06-14 MED ORDER — LACTATED RINGERS IV SOLN
INTRAVENOUS | Status: DC | PRN
  Administered 2017-06-14 (×2): via INTRAVENOUS

## 2017-06-14 MED ORDER — GABAPENTIN 300 MG PO CAPS
300.0000 mg | ORAL_CAPSULE | Freq: Every day | ORAL | Status: DC
  Administered 2017-06-15 – 2017-06-20 (×6): 300 mg via ORAL
  Filled 2017-06-14 (×6): qty 1

## 2017-06-14 MED ORDER — EPHEDRINE SULFATE 50 MG/ML IJ SOLN
INTRAMUSCULAR | Status: AC
  Filled 2017-06-14: qty 1

## 2017-06-14 MED ORDER — DOPAMINE-DEXTROSE 3.2-5 MG/ML-% IV SOLN: 3.0000 ug/kg/min | INTRAVENOUS | Status: DC

## 2017-06-14 MED ORDER — METOCLOPRAMIDE HCL 5 MG/ML IJ SOLN
10.0000 mg | Freq: Four times a day (QID) | INTRAMUSCULAR | Status: DC
  Administered 2017-06-14 – 2017-06-15 (×3): 10 mg via INTRAVENOUS
  Filled 2017-06-14 (×2): qty 2

## 2017-06-14 MED ORDER — PROPOFOL 10 MG/ML IV BOLUS
INTRAVENOUS | Status: DC | PRN
  Administered 2017-06-14: 30 mg via INTRAVENOUS

## 2017-06-14 MED ORDER — DEXMEDETOMIDINE HCL 200 MCG/2ML IV SOLN
INTRAVENOUS | Status: DC | PRN
  Administered 2017-06-14: 0.2 ug/kg/h via INTRAVENOUS

## 2017-06-14 MED ORDER — LACTATED RINGERS IV SOLN: 500.0000 mL | Freq: Once | INTRAVENOUS | Status: DC | PRN

## 2017-06-14 MED ORDER — SODIUM CHLORIDE 0.9% FLUSH
10.0000 mL | Freq: Two times a day (BID) | INTRAVENOUS | Status: DC
  Administered 2017-06-14 – 2017-06-15 (×2): 10 mL
  Administered 2017-06-16: 20 mL
  Administered 2017-06-17: 10 mL
  Administered 2017-06-17: 20 mL
  Administered 2017-06-18 (×2): 10 mL
  Administered 2017-06-19 – 2017-06-20 (×4): 20 mL

## 2017-06-14 MED ORDER — MORPHINE SULFATE (PF) 2 MG/ML IV SOLN
1.0000 mg | INTRAVENOUS | Status: AC | PRN
  Administered 2017-06-14: 2 mg via INTRAVENOUS
  Filled 2017-06-14: qty 1

## 2017-06-14 MED ORDER — SODIUM CHLORIDE 0.9 % IV SOLN
1.5000 g | Freq: Two times a day (BID) | INTRAVENOUS | Status: AC
  Administered 2017-06-14 – 2017-06-16 (×4): 1.5 g via INTRAVENOUS
  Filled 2017-06-14 (×4): qty 1.5

## 2017-06-14 MED ORDER — SODIUM CHLORIDE 0.9 % IV SOLN
INTRAVENOUS | Status: DC | PRN
  Administered 2017-06-14: .3 [IU]/h via INTRAVENOUS

## 2017-06-14 MED ORDER — PHENYLEPHRINE 40 MCG/ML (10ML) SYRINGE FOR IV PUSH (FOR BLOOD PRESSURE SUPPORT)
PREFILLED_SYRINGE | INTRAVENOUS | Status: AC
  Filled 2017-06-14: qty 10

## 2017-06-14 MED ORDER — ACETAMINOPHEN 160 MG/5ML PO SOLN: 1000.0000 mg | Freq: Four times a day (QID) | ORAL | Status: AC

## 2017-06-14 MED ORDER — MILRINONE LACTATE IN DEXTROSE 20-5 MG/100ML-% IV SOLN
0.1250 ug/kg/min | INTRAVENOUS | Status: DC
  Administered 2017-06-15 (×2): 0.25 ug/kg/min via INTRAVENOUS
  Administered 2017-06-16: 0.125 ug/kg/min via INTRAVENOUS
  Administered 2017-06-16: 0.25 ug/kg/min via INTRAVENOUS
  Filled 2017-06-14 (×4): qty 100

## 2017-06-14 MED ORDER — ASPIRIN EC 325 MG PO TBEC
325.0000 mg | DELAYED_RELEASE_TABLET | Freq: Every day | ORAL | Status: DC
  Administered 2017-06-15 – 2017-06-21 (×6): 325 mg via ORAL
  Filled 2017-06-14 (×6): qty 1

## 2017-06-14 MED ORDER — DEXMEDETOMIDINE HCL IN NACL 400 MCG/100ML IV SOLN
0.0000 ug/kg/h | INTRAVENOUS | Status: DC
  Administered 2017-06-14: 0.7 ug/kg/h via INTRAVENOUS
  Filled 2017-06-14: qty 100

## 2017-06-14 MED ORDER — MIDAZOLAM HCL 2 MG/2ML IJ SOLN
2.0000 mg | INTRAMUSCULAR | Status: DC | PRN
  Administered 2017-06-14 (×2): 2 mg via INTRAVENOUS
  Filled 2017-06-14 (×2): qty 2

## 2017-06-14 MED ORDER — MIDAZOLAM HCL 5 MG/5ML IJ SOLN
INTRAMUSCULAR | Status: DC | PRN
  Administered 2017-06-14 (×3): 2 mg via INTRAVENOUS

## 2017-06-14 MED ORDER — FAMOTIDINE IN NACL 20-0.9 MG/50ML-% IV SOLN
20.0000 mg | Freq: Two times a day (BID) | INTRAVENOUS | Status: DC
  Administered 2017-06-14: 20 mg via INTRAVENOUS

## 2017-06-14 MED ORDER — PROTAMINE SULFATE 10 MG/ML IV SOLN
INTRAVENOUS | Status: DC | PRN
  Administered 2017-06-14: 10 mg via INTRAVENOUS
  Administered 2017-06-14: 340 mg via INTRAVENOUS

## 2017-06-14 MED ORDER — ROCURONIUM BROMIDE 10 MG/ML (PF) SYRINGE
PREFILLED_SYRINGE | INTRAVENOUS | Status: AC
  Filled 2017-06-14: qty 5

## 2017-06-14 MED ORDER — BISACODYL 5 MG PO TBEC
10.0000 mg | DELAYED_RELEASE_TABLET | Freq: Every day | ORAL | Status: DC
  Administered 2017-06-15 – 2017-06-21 (×6): 10 mg via ORAL
  Filled 2017-06-14 (×6): qty 2

## 2017-06-14 MED ORDER — ACETAMINOPHEN 160 MG/5ML PO SOLN: 650.0000 mg | Freq: Once | ORAL | Status: AC

## 2017-06-14 MED ORDER — FENTANYL CITRATE (PF) 250 MCG/5ML IJ SOLN
INTRAMUSCULAR | Status: AC
  Filled 2017-06-14: qty 30

## 2017-06-14 MED ORDER — OXYCODONE HCL 5 MG PO TABS
5.0000 mg | ORAL_TABLET | ORAL | Status: DC | PRN
  Administered 2017-06-15 – 2017-06-16 (×5): 10 mg via ORAL
  Administered 2017-06-16: 5 mg via ORAL
  Administered 2017-06-17: 10 mg via ORAL
  Administered 2017-06-17: 5 mg via ORAL
  Administered 2017-06-17: 10 mg via ORAL
  Administered 2017-06-18: 5 mg via ORAL
  Administered 2017-06-18: 10 mg via ORAL
  Administered 2017-06-18 – 2017-06-19 (×3): 5 mg via ORAL
  Administered 2017-06-19 – 2017-06-21 (×6): 10 mg via ORAL
  Filled 2017-06-14 (×3): qty 2
  Filled 2017-06-14: qty 1
  Filled 2017-06-14 (×7): qty 2
  Filled 2017-06-14: qty 1
  Filled 2017-06-14: qty 2
  Filled 2017-06-14: qty 1
  Filled 2017-06-14 (×3): qty 2
  Filled 2017-06-14: qty 1
  Filled 2017-06-14 (×3): qty 2

## 2017-06-14 MED ORDER — SODIUM CHLORIDE 0.9 % IV SOLN
INTRAVENOUS | Status: DC
  Filled 2017-06-14 (×2): qty 1

## 2017-06-14 MED ORDER — DOCUSATE SODIUM 100 MG PO CAPS
200.0000 mg | ORAL_CAPSULE | Freq: Every day | ORAL | Status: DC
  Administered 2017-06-15 – 2017-06-21 (×7): 200 mg via ORAL
  Filled 2017-06-14 (×7): qty 2

## 2017-06-14 MED ORDER — MIDAZOLAM HCL 10 MG/2ML IJ SOLN
INTRAMUSCULAR | Status: AC
  Filled 2017-06-14: qty 2

## 2017-06-14 MED ORDER — PANTOPRAZOLE SODIUM 40 MG PO TBEC
40.0000 mg | DELAYED_RELEASE_TABLET | Freq: Every day | ORAL | Status: DC
  Administered 2017-06-16 – 2017-06-21 (×6): 40 mg via ORAL
  Filled 2017-06-14 (×6): qty 1

## 2017-06-14 MED ORDER — SODIUM CHLORIDE 0.9% FLUSH
3.0000 mL | Freq: Two times a day (BID) | INTRAVENOUS | Status: DC
  Administered 2017-06-15 – 2017-06-18 (×4): 3 mL via INTRAVENOUS

## 2017-06-14 MED ORDER — PHENYLEPHRINE HCL 10 MG/ML IJ SOLN: INTRAMUSCULAR | Status: DC | PRN

## 2017-06-14 MED ORDER — ACETAMINOPHEN 500 MG PO TABS
1000.0000 mg | ORAL_TABLET | Freq: Four times a day (QID) | ORAL | Status: AC
  Administered 2017-06-14 – 2017-06-19 (×20): 1000 mg via ORAL
  Filled 2017-06-14 (×20): qty 2

## 2017-06-14 MED ORDER — CHLORHEXIDINE GLUCONATE 0.12% ORAL RINSE (MEDLINE KIT)
15.0000 mL | Freq: Two times a day (BID) | OROMUCOSAL | Status: DC
  Administered 2017-06-14: 15 mL via OROMUCOSAL

## 2017-06-14 MED ORDER — ONDANSETRON HCL 4 MG/2ML IJ SOLN
4.0000 mg | Freq: Four times a day (QID) | INTRAMUSCULAR | Status: DC | PRN
  Administered 2017-06-15 – 2017-06-18 (×7): 4 mg via INTRAVENOUS
  Filled 2017-06-14 (×7): qty 2

## 2017-06-14 MED ORDER — MAGNESIUM SULFATE 4 GM/100ML IV SOLN
4.0000 g | Freq: Once | INTRAVENOUS | Status: AC
  Administered 2017-06-14: 4 g via INTRAVENOUS
  Filled 2017-06-14: qty 100

## 2017-06-14 MED ORDER — LACTATED RINGERS IV SOLN
INTRAVENOUS | Status: DC | PRN
  Administered 2017-06-14: 07:00:00 via INTRAVENOUS

## 2017-06-14 MED ORDER — SODIUM CHLORIDE 0.9 % IV SOLN: Freq: Once | INTRAVENOUS | Status: DC

## 2017-06-14 MED ORDER — SODIUM CHLORIDE 0.45 % IV SOLN: INTRAVENOUS | Status: DC | PRN

## 2017-06-14 MED ORDER — PHENYLEPHRINE HCL 10 MG/ML IJ SOLN
INTRAVENOUS | Status: DC | PRN
  Administered 2017-06-14: 40 ug/min via INTRAVENOUS

## 2017-06-14 MED ORDER — EPHEDRINE SULFATE 50 MG/ML IJ SOLN
INTRAMUSCULAR | Status: DC | PRN
  Administered 2017-06-14: 15 mg via INTRAVENOUS
  Administered 2017-06-14 (×2): 10 mg via INTRAVENOUS
  Administered 2017-06-14: 5 mg via INTRAVENOUS
  Administered 2017-06-14: 20 mg via INTRAVENOUS

## 2017-06-14 MED ORDER — SODIUM CHLORIDE 0.9 % IV SOLN
INTRAVENOUS | Status: DC
  Administered 2017-06-14: 16:00:00 via INTRAVENOUS

## 2017-06-14 MED ORDER — ORAL CARE MOUTH RINSE
15.0000 mL | OROMUCOSAL | Status: DC
  Administered 2017-06-14: 15 mL via OROMUCOSAL

## 2017-06-14 MED ORDER — NITROGLYCERIN IN D5W 200-5 MCG/ML-% IV SOLN: 0.0000 ug/min | INTRAVENOUS | Status: DC

## 2017-06-14 MED ORDER — ONDANSETRON HCL 4 MG/2ML IJ SOLN
INTRAMUSCULAR | Status: AC
  Filled 2017-06-14: qty 2

## 2017-06-14 MED ORDER — MORPHINE SULFATE (PF) 2 MG/ML IV SOLN
2.0000 mg | INTRAVENOUS | Status: DC | PRN
  Administered 2017-06-14 (×4): 2 mg via INTRAVENOUS
  Administered 2017-06-15 (×2): 4 mg via INTRAVENOUS
  Administered 2017-06-15 – 2017-06-16 (×3): 2 mg via INTRAVENOUS
  Administered 2017-06-16: 4 mg via INTRAVENOUS
  Administered 2017-06-16: 2 mg via INTRAVENOUS
  Filled 2017-06-14 (×2): qty 2
  Filled 2017-06-14: qty 1
  Filled 2017-06-14: qty 2
  Filled 2017-06-14 (×2): qty 1
  Filled 2017-06-14 (×3): qty 2

## 2017-06-14 MED ORDER — SODIUM CHLORIDE 0.9% FLUSH: 3.0000 mL | INTRAVENOUS | Status: DC | PRN

## 2017-06-14 SURGICAL SUPPLY — 118 items
ADAPTER CARDIO PERF ANTE/RETRO (ADAPTER) ×6 IMPLANT
ADH SKN CLS APL DERMABOND .7 (GAUZE/BANDAGES/DRESSINGS) ×4
ADPR PRFSN 84XANTGRD RTRGD (ADAPTER) ×4
BAG DECANTER FOR FLEXI CONT (MISCELLANEOUS) ×6 IMPLANT
BANDAGE ACE 4X5 VEL STRL LF (GAUZE/BANDAGES/DRESSINGS) ×6 IMPLANT
BANDAGE ACE 6X5 VEL STRL LF (GAUZE/BANDAGES/DRESSINGS) ×6 IMPLANT
BASKET HEART  (ORDER IN 25'S) (MISCELLANEOUS) ×1
BASKET HEART (ORDER IN 25'S) (MISCELLANEOUS) ×1
BASKET HEART (ORDER IN 25S) (MISCELLANEOUS) ×4 IMPLANT
BLADE CLIPPER SURG (BLADE) IMPLANT
BLADE STERNUM SYSTEM 6 (BLADE) ×6 IMPLANT
BLADE SURG 11 STRL SS (BLADE) ×2 IMPLANT
BLADE SURG 12 STRL SS (BLADE) ×6 IMPLANT
BLADE SURG 15 STRL LF DISP TIS (BLADE) IMPLANT
BLADE SURG 15 STRL SS (BLADE) ×6
BNDG GAUZE ELAST 4 BULKY (GAUZE/BANDAGES/DRESSINGS) ×6 IMPLANT
CANISTER SUCT 3000ML PPV (MISCELLANEOUS) ×6 IMPLANT
CANNULA GUNDRY RCSP 15FR (MISCELLANEOUS) ×8 IMPLANT
CATH BONANNO SUPRAPUBIC 14G (CATHETERS) ×2 IMPLANT
CATH COUDE FOLEY 2W 5CC 16FR (CATHETERS) ×2 IMPLANT
CATH COUDE FOLEY 2W 5CC 20FR (CATHETERS) ×2 IMPLANT
CATH CPB KIT VANTRIGT (MISCELLANEOUS) ×6 IMPLANT
CATH FOLEY INTRO SUPRA 16F (CATHETERS) ×2 IMPLANT
CATH ROBINSON RED A/P 18FR (CATHETERS) ×18 IMPLANT
CATH THORACIC 36FR RT ANG (CATHETERS) ×6 IMPLANT
CLIP FOGARTY SPRING 6M (CLIP) ×2 IMPLANT
CLIP RETRACTION 3.0MM CORONARY (MISCELLANEOUS) ×2 IMPLANT
CRADLE DONUT ADULT HEAD (MISCELLANEOUS) ×6 IMPLANT
DERMABOND ADVANCED (GAUZE/BANDAGES/DRESSINGS) ×2
DERMABOND ADVANCED .7 DNX12 (GAUZE/BANDAGES/DRESSINGS) IMPLANT
DRAIN CHANNEL 32F RND 10.7 FF (WOUND CARE) ×6 IMPLANT
DRAPE CARDIOVASCULAR INCISE (DRAPES) ×6
DRAPE SLUSH/WARMER DISC (DRAPES) ×6 IMPLANT
DRAPE SRG 135X102X78XABS (DRAPES) ×4 IMPLANT
DRSG AQUACEL AG ADV 3.5X14 (GAUZE/BANDAGES/DRESSINGS) ×6 IMPLANT
ELECT BLADE 4.0 EZ CLEAN MEGAD (MISCELLANEOUS) ×6
ELECT BLADE 6.5 EXT (BLADE) ×6 IMPLANT
ELECT CAUTERY BLADE 6.4 (BLADE) ×6 IMPLANT
ELECT REM PT RETURN 9FT ADLT (ELECTROSURGICAL) ×12
ELECTRODE BLDE 4.0 EZ CLN MEGD (MISCELLANEOUS) ×4 IMPLANT
ELECTRODE REM PT RTRN 9FT ADLT (ELECTROSURGICAL) ×8 IMPLANT
FELT TEFLON 1X6 (MISCELLANEOUS) ×12 IMPLANT
GAUZE SPONGE 4X4 12PLY STRL (GAUZE/BANDAGES/DRESSINGS) ×12 IMPLANT
GLOVE BIO SURGEON STRL SZ 6.5 (GLOVE) ×4 IMPLANT
GLOVE BIO SURGEON STRL SZ7.5 (GLOVE) ×18 IMPLANT
GLOVE BIO SURGEON STRL SZ8 (GLOVE) ×6 IMPLANT
GLOVE BIO SURGEONS STRL SZ 6.5 (GLOVE) ×4
GLOVE BIOGEL M 8.0 STRL (GLOVE) ×6 IMPLANT
GLOVE BIOGEL PI IND STRL 6 (GLOVE) IMPLANT
GLOVE BIOGEL PI INDICATOR 6 (GLOVE) ×6
GLOVE SURG SS PI 6.0 STRL IVOR (GLOVE) ×4 IMPLANT
GOWN STRL REUS W/ TWL LRG LVL3 (GOWN DISPOSABLE) ×16 IMPLANT
GOWN STRL REUS W/TWL LRG LVL3 (GOWN DISPOSABLE) ×54
GUIDEWIRE STR DUAL SENSOR (WIRE) ×4 IMPLANT
HEMOSTAT POWDER SURGIFOAM 1G (HEMOSTASIS) ×18 IMPLANT
HEMOSTAT SURGICEL 2X14 (HEMOSTASIS) ×6 IMPLANT
INSERT FOGARTY XLG (MISCELLANEOUS) IMPLANT
KIT BASIN OR (CUSTOM PROCEDURE TRAY) ×6 IMPLANT
KIT SUCTION CATH 14FR (SUCTIONS) ×6 IMPLANT
KIT TURNOVER KIT B (KITS) ×6 IMPLANT
KIT VASOVIEW HEMOPRO VH 3000 (KITS) ×6 IMPLANT
LEAD PACING EPI (Prosthesis & Implant Heart) ×2 IMPLANT
LEAD PACING MYOCARDI (MISCELLANEOUS) ×6 IMPLANT
MARKER GRAFT CORONARY BYPASS (MISCELLANEOUS) ×18 IMPLANT
MASK FACE 3 LYR ANT FOG FR FLM (MASK) IMPLANT
MASK SURG FACE ANTI FOG ADLT (MASK) ×6
NDL SPNL 18GX3.5 QUINCKE PK (NEEDLE) IMPLANT
NEEDLE SPNL 18GX3.5 QUINCKE PK (NEEDLE) ×6 IMPLANT
NS IRRIG 1000ML POUR BTL (IV SOLUTION) ×32 IMPLANT
PACK E OPEN HEART (SUTURE) ×6 IMPLANT
PACK OPEN HEART (CUSTOM PROCEDURE TRAY) ×6 IMPLANT
PAD ARMBOARD 7.5X6 YLW CONV (MISCELLANEOUS) ×12 IMPLANT
PAD ELECT DEFIB RADIOL ZOLL (MISCELLANEOUS) ×6 IMPLANT
PENCIL BUTTON HOLSTER BLD 10FT (ELECTRODE) ×8 IMPLANT
POWDER SURGICEL 3.0 GRAM (HEMOSTASIS) ×2 IMPLANT
PUNCH AORTIC ROTATE  4.5MM 8IN (MISCELLANEOUS) ×2 IMPLANT
PUNCH AORTIC ROTATE 4.0MM (MISCELLANEOUS) IMPLANT
PUNCH AORTIC ROTATE 4.5MM 8IN (MISCELLANEOUS) IMPLANT
PUNCH AORTIC ROTATE 5MM 8IN (MISCELLANEOUS) IMPLANT
SET CARDIOPLEGIA MPS 5001102 (MISCELLANEOUS) ×2 IMPLANT
SET CYSTO W/LG BORE CLAMP LF (SET/KITS/TRAYS/PACK) ×2 IMPLANT
SPONGE LAP 18X18 X RAY DECT (DISPOSABLE) ×2 IMPLANT
SURGIFLO W/THROMBIN 8M KIT (HEMOSTASIS) ×6 IMPLANT
SUT BONE WAX W31G (SUTURE) ×6 IMPLANT
SUT MNCRL AB 4-0 PS2 18 (SUTURE) ×2 IMPLANT
SUT PROLENE 3 0 SH DA (SUTURE) IMPLANT
SUT PROLENE 3 0 SH1 36 (SUTURE) IMPLANT
SUT PROLENE 4 0 RB 1 (SUTURE) ×6
SUT PROLENE 4 0 SH DA (SUTURE) ×6 IMPLANT
SUT PROLENE 4-0 RB1 .5 CRCL 36 (SUTURE) ×4 IMPLANT
SUT PROLENE 5 0 C 1 36 (SUTURE) ×2 IMPLANT
SUT PROLENE 6 0 C 1 30 (SUTURE) ×16 IMPLANT
SUT PROLENE 6 0 CC (SUTURE) ×22 IMPLANT
SUT PROLENE 8 0 BV175 6 (SUTURE) IMPLANT
SUT PROLENE BLUE 7 0 (SUTURE) ×8 IMPLANT
SUT SILK  1 MH (SUTURE)
SUT SILK 1 MH (SUTURE) IMPLANT
SUT SILK 2 0 SH CR/8 (SUTURE) ×4 IMPLANT
SUT SILK 3 0 SH CR/8 (SUTURE) IMPLANT
SUT STEEL 6MS V (SUTURE) ×12 IMPLANT
SUT STEEL SZ 6 DBL 3X14 BALL (SUTURE) ×6 IMPLANT
SUT VIC AB 1 CTX 36 (SUTURE) ×18
SUT VIC AB 1 CTX36XBRD ANBCTR (SUTURE) ×8 IMPLANT
SUT VIC AB 2-0 CT1 27 (SUTURE) ×6
SUT VIC AB 2-0 CT1 TAPERPNT 27 (SUTURE) IMPLANT
SUT VIC AB 2-0 CTX 27 (SUTURE) IMPLANT
SUT VIC AB 3-0 SH 8-18 (SUTURE) ×2 IMPLANT
SUT VIC AB 3-0 X1 27 (SUTURE) ×2 IMPLANT
SYSTEM SAHARA CHEST DRAIN ATS (WOUND CARE) ×6 IMPLANT
TAPE CLOTH SURG 4X10 WHT LF (GAUZE/BANDAGES/DRESSINGS) ×2 IMPLANT
TAPE PAPER 2X10 WHT MICROPORE (GAUZE/BANDAGES/DRESSINGS) ×2 IMPLANT
TOWEL GREEN STERILE (TOWEL DISPOSABLE) ×6 IMPLANT
TOWEL GREEN STERILE FF (TOWEL DISPOSABLE) ×6 IMPLANT
TRAY FOLEY IC TEMP SENS 14FR (CATHETERS) ×2 IMPLANT
TRAY FOLEY SLVR 16FR TEMP STAT (SET/KITS/TRAYS/PACK) ×6 IMPLANT
TUBING INSUFFLATION (TUBING) ×6 IMPLANT
UNDERPAD 30X30 (UNDERPADS AND DIAPERS) ×6 IMPLANT
WATER STERILE IRR 1000ML POUR (IV SOLUTION) ×12 IMPLANT

## 2017-06-14 NOTE — Progress Notes (Signed)
      [address]       [place] [address]             [phone]      S/p CABG x 4,  Suprapubic catheter  Intubated, sedated  BP 121/67   Pulse (!) 112   Temp 98.4 F (36.9 C)   Resp 16   Ht 6\' 2"  (1.88 m)   Wt 220 lb 8 oz (100 kg)   SpO2 99%   BMI 28.31 kg/m   Intake/Output Summary (Last 24 hours) at 06/14/2017 1855 Last data filed at 06/14/2017 1800 Gross per 24 hour  Intake 5286 ml  Output 2452 ml  Net 2834 ml  cardiac index > 3 Ct 60-80 per hour  Doing well early postop  Viviann Spare C. Dorris Fetch, MD [facility] [phone]

## 2017-06-14 NOTE — Op Note (Signed)
Preoperative diagnosis: urethral stricture  Postop diagnosis: Same  Procedure: 1. Cystoscopy 2. Suprapubic tube placement  Attending: Wilkie Aye  Anesthesia: General  Estimated blood loss: 5 cc  Drains: 1. 16 French SP tube  Specimens: none  Antibiotics: cefuroxime and vancomycin  Findings: obliterated bulbar urethral stricture  Indications: Patient is a 62 year old with a history of CAD who was undergoing bypass grafting. A foley was unable to be placed and urology was consult for foley placement.   Procedure in detail:  General anesthesia was in administered patient was placed in the supine position. The a flexible 17 French cystoscope was passed urethra and we encountered a dense bulbar urethral stricture. We attempted to pass a wore into the bladder which was unsuccessful. The patient's bladder was palpable and the decision was made to place an SP tube.  We then made a 1cm incision 2cm above the pubic symphysis. The patient was placed in steep trendelenburg on. We then introduced a spine needle into the dome of the bladder which was confirmed by aspirating urine. We then placed a sensor wire through the needle and into the bladder. We then removed the needle and adanced the stamey introducer and sheath.  We removed the introducer and placed a 16 french foley through the sheath. We then filled the foley balloon with 20cc of water. The sheath was then removed and the SP tube was secured to the skin with 0 silk. This concluded the procedure which resulted by the patient.  Complications: None  Condition: Stable  Plan: Patient is to be keep his SpP tube for 1 month and have a voiding cystourethrogram in my office prior to SP tube removal

## 2017-06-14 NOTE — Progress Notes (Signed)
On call MD notified and updated regarding patients complaint of pain post extubation and no relief after 6mg  IV morphine being administered. Orders obtained for Toradol 15mg  IV q6 hrs PRN. MD also made aware of decrease in Hgb from 11.9 to 9.6. No further orders received.

## 2017-06-14 NOTE — Progress Notes (Signed)
On call MD notified and updated with ABG result prior to extubation after patient completed wean protocol. Advised by MD okay to proceed to extubate. No further orders received at this time.

## 2017-06-14 NOTE — Anesthesia Procedure Notes (Signed)
Central Venous Catheter Insertion Performed by: Gaynelle Adu, MD, anesthesiologist Start/End6/10/2017 6:50 AM, 06/14/2017 7:05 AM Patient location: Pre-op. Preanesthetic checklist: patient identified, IV checked, site marked, risks and benefits discussed, surgical consent, monitors and equipment checked, pre-op evaluation, timeout performed and anesthesia consent Position: Trendelenburg Lidocaine 1% used for infiltration and patient sedated Hand hygiene performed , maximum sterile barriers used  and Seldinger technique used Catheter size: 8.5 Fr Total catheter length 10. Central line was placed.Sheath introducer Procedure performed using ultrasound guided technique. Ultrasound Notes:anatomy identified, needle tip was noted to be adjacent to the nerve/plexus identified, no ultrasound evidence of intravascular and/or intraneural injection and image(s) printed for medical record Attempts: 1 Following insertion, line sutured, dressing applied and Biopatch. Post procedure assessment: blood return through all ports, free fluid flow and no air  Patient tolerated the procedure well with no immediate complications.

## 2017-06-14 NOTE — Anesthesia Postprocedure Evaluation (Signed)
Anesthesia Post Note  Patient: [name]  Procedure(s) Performed: CORONARY ARTERY BYPASS GRAFTING (CABG) x four, using left internal mammary artery and right leg greater saphenous vein harvested endoscopically (N/A Chest) TRANSESOPHAGEAL ECHOCARDIOGRAM (TEE) (N/A ) CYSTOSCOPY (N/A Penis) INSERTION OF SUPRAPUBIC CATHETER - Lower abdomen (N/A Abdomen)     Patient location during evaluation: SICU Anesthesia Type: General Level of consciousness: sedated Pain management: pain level controlled Vital Signs Assessment: post-procedure vital signs reviewed and stable Respiratory status: patient remains intubated per anesthesia plan Cardiovascular status: stable Postop Assessment: no apparent nausea or vomiting Anesthetic complications: no    Last Vitals:  Vitals:   06/14/17 0439 06/14/17 1601  BP: 135/72 (!) 104/53  Pulse: 68 88  Resp: 20 16  Temp: 36.4 C   SpO2: 99% 97%    Last Pain:  Vitals:   06/14/17 0439  TempSrc: Oral  PainSc:                  Kennieth Rad

## 2017-06-14 NOTE — Progress Notes (Signed)
Pre Procedure note for inpatients:   KWINTON SCHOEPP has been scheduled for Procedure(s): CORONARY ARTERY BYPASS GRAFTING (CABG) (N/A) TRANSESOPHAGEAL ECHOCARDIOGRAM (TEE) (N/A) today. The various methods of treatment have been discussed with the patient. After consideration of the risks, benefits and treatment options the patient has consented to the planned procedure.   The patient has been seen and labs reviewed. There are no changes in the patient's condition to prevent proceeding with the planned procedure today.  Recent labs:  Lab Results  Component Value Date   WBC 4.9 06/14/2017   HGB 14.5 06/14/2017   HCT 44.3 06/14/2017   PLT 240 06/14/2017   GLUCOSE 81 06/14/2017   CHOL 85 06/10/2017   TRIG 89 06/10/2017   HDL 42 06/10/2017   LDLCALC 25 06/10/2017   ALT 19 06/10/2017   AST 22 06/10/2017   NA 140 06/14/2017   K 4.1 06/14/2017   CL 105 06/14/2017   CREATININE 1.16 06/14/2017   BUN 22 (H) 06/14/2017   CO2 25 06/14/2017   TSH 3.983 06/10/2017   INR 1.11 06/08/2017   HGBA1C 5.9 (H) 06/10/2017    Mikey Bussing, MD 06/14/2017 7:49 AM

## 2017-06-14 NOTE — Consult Note (Signed)
Urology Consult  Referring physician: Dr. Prescott Gum Reason for referral: foley catheter placement  Chief Complaint: urinary retention  History of Present Illness: Mr Lumpkin is a 62yo with a hx of urethral stricture and BPH who was scheduled for CABG and nursing was unable to place a foley. Urology was consulted for foley placement and the patient was found on cystoscopy to have a dense bulbar urethral stricture. I was unable to place a wire into the bladder which was unsuccessful and a SP tube was placed. The patient had a similar issue 7-8 years ago at the time of back surgery and had a urethral stricture dilated by Dr. Jasmine December at [facility]. Prior to hospitalization he had a weaker stream and he had to strain to urinate. He denied any dysuria or hematuria  Past Medical History:  Diagnosis Date  . Arthritis    "mild; back, neck, spine" (06/09/2017)  . CHF (congestive heart failure) ([place])   . Coronary artery disease   . History of gout   . History of kidney stones X 2  . Hypercholesterolemia   . Hypertension   . LBBB (left bundle branch block)   . MI (myocardial infarction) ([place]) 1993   LATERAL  . MI (myocardial infarction) ([place]) ?09/2001; 06/07/2017  . Pneumonia    "several times" (06/09/2017)  . S/P coronary artery stent placement    RCA  . Varicose veins    Past Surgical History:  Procedure Laterality Date  . ANTERIOR CERVICAL DECOMP/DISCECTOMY FUSION  03/03/2011   Procedure: ANTERIOR CERVICAL DECOMPRESSION/DISCECTOMY FUSION 3 LEVELS;  Surgeon: Floyce Stakes, MD;  Location: [facility];  Service: Neurosurgery;  Laterality: N/A;  Cervical three-four Cervical four-five Cervical five-six Anterior cervical decompression/diskectomy, fusion  . BACK SURGERY    . CARDIAC CATHETERIZATION  2009   Stents in RCA patent. 70 to 80% PL, and 50% ostial PD.   Marland Kitchen CARDIAC CATHETERIZATION N/A 11/20/2014   Procedure: Left Heart Cath and Coronary Angiography;  Surgeon: Peter M Martinique, MD;   Location: [facility];  Service: Cardiovascular;  Laterality: N/A;  . CORONARY ANGIOPLASTY     DIRECT ANGIOPLASTY THE MARGINAL BRANCH  . CORONARY ANGIOPLASTY WITH STENT PLACEMENT     RCA  . POSTERIOR LUMBAR FUSION  10/2011  . RIGHT/LEFT HEART CATH AND CORONARY ANGIOGRAPHY N/A 06/08/2017   Procedure: RIGHT/LEFT HEART CATH AND CORONARY ANGIOGRAPHY;  Surgeon: Martinique, Peter M, MD;  Location: [facility];  Service: Cardiovascular;  Laterality: N/A;    Medications: I have reviewed the patient's current medications. Allergies:  Allergies  Allergen Reactions  . Ezetimibe Other (See Comments)    Joint pain and drops BP  . Niacin Other (See Comments)    Drops BP and severe  . Statins Other (See Comments)    Severe joint pain and drops BP.    Family History  Problem Relation Age of Onset  . Heart attack Father   . Diabetes Father   . Breast cancer Mother   . Coronary artery disease Mother   . Heart disease Brother   . Heart attack Maternal Grandfather    Social History:  reports that he quit smoking about 18 years ago. His smoking use included cigarettes. He has a 40.00 pack-year smoking history. He has never used smokeless tobacco. He reports that he drinks alcohol. He reports that he does not use drugs.  Review of Systems  All other systems reviewed and are negative.   Physical Exam:  Vital signs in last  24 hours: Temp:  [97.6 F (36.4 C)-98 F (36.7 C)] 97.6 F (36.4 C) (06/10 0439) Pulse Rate:  [68-88] 88 (06/10 1601) Resp:  [16-20] 16 (06/10 1601) BP: (104-135)/(53-74) 104/53 (06/10 1601) SpO2:  [97 %-99 %] 97 % (06/10 1601) FiO2 (%):  [100 %] 100 % (06/10 1601) Weight:  [100 kg (220 lb 8 oz)] 100 kg (220 lb 8 oz) (06/10 0439) Physical Exam  Constitutional: He is oriented to person, place, and time. He appears well-developed and well-nourished.  HENT:  Head: Normocephalic and atraumatic.  Eyes: Pupils are equal, round, and reactive to light. EOM are normal.   Neck: Normal range of motion. No thyromegaly present.  Cardiovascular: Normal rate and regular rhythm.  Respiratory: Effort normal. No respiratory distress.  GI: Soft. He exhibits no distension. Hernia confirmed negative in the right inguinal area and confirmed negative in the left inguinal area.  Genitourinary: Testes normal and penis normal.  Musculoskeletal: Normal range of motion. He exhibits no edema.  Lymphadenopathy:       Right: No inguinal adenopathy present.       Left: No inguinal adenopathy present.  Neurological: He is alert and oriented to person, place, and time.  Skin: Skin is warm and dry.  Psychiatric: He has a normal mood and affect. His behavior is normal. Judgment and thought content normal.    Laboratory Data:  Results for orders placed or performed during the hospital encounter of 06/07/17 (from the past 72 hour(s))  CBC     Status: None   Collection Time: 06/12/17  8:10 AM  Result Value Ref Range   WBC 4.7 4.0 - 10.5 K/uL   RBC 4.82 4.22 - 5.81 MIL/uL   Hemoglobin 14.7 13.0 - 17.0 g/dL   HCT 45.7 39.0 - 52.0 %   MCV 94.8 78.0 - 100.0 fL   MCH 30.5 26.0 - 34.0 pg   MCHC 32.2 30.0 - 36.0 g/dL   RDW 13.2 11.5 - 15.5 %   Platelets 260 150 - 400 K/uL    Comment: Performed at [facility] [address], [place], [place] [address]  Heparin level (unfractionated)     Status: None   Collection Time: 06/12/17  8:10 AM  Result Value Ref Range   Heparin Unfractionated 0.64 0.30 - 0.70 IU/mL    Comment: (NOTE) If heparin results are below expected values, and patient dosage has  been confirmed, suggest follow up testing of antithrombin III levels. Performed at [facility], [place] [address], [place], [place] [address]   Basic metabolic panel     Status: None   Collection Time: 06/12/17  8:10 AM  Result Value Ref Range   Sodium 140 135 - 145 mmol/L   Potassium 4.0 3.5 - 5.1 mmol/L   Chloride 105 101 - 111 mmol/L   CO2 25 22 - 32 mmol/L   Glucose,  Bld 97 65 - 99 mg/dL   BUN 18 6 - 20 mg/dL   Creatinine, Ser 1.09 0.61 - 1.24 mg/dL   Calcium 9.3 8.9 - 10.3 mg/dL   GFR calc non Af Amer >60 >60 mL/min   GFR calc Af Amer >60 >60 mL/min    Comment: (NOTE) The eGFR has been calculated using the CKD EPI equation. This calculation has not been validated in all clinical situations. eGFR's persistently <60 mL/min signify possible Chronic Kidney Disease.    Anion gap 10 5 - 15    Comment: Performed at [facility], [address] [place], [place] [address]  Urinalysis, Routine w reflex microscopic     Status: Abnormal   Collection Time: 06/12/17  9:36 PM  Result Value Ref Range   Color, Urine YELLOW YELLOW   APPearance HAZY (A) CLEAR   Specific Gravity, Urine 1.006 1.005 - 1.030   pH 7.0 5.0 - 8.0   Glucose, UA NEGATIVE NEGATIVE mg/dL   Hgb urine dipstick SMALL (A) NEGATIVE   Bilirubin Urine NEGATIVE NEGATIVE   Ketones, ur NEGATIVE NEGATIVE mg/dL   Protein, ur NEGATIVE NEGATIVE mg/dL   Nitrite POSITIVE (A) NEGATIVE   Leukocytes, UA NEGATIVE NEGATIVE   WBC, UA 0-5 0 - 5 WBC/hpf   Bacteria, UA RARE (A) NONE SEEN   Squamous Epithelial / LPF 0-5 0 - 5   Mucus PRESENT     Comment: Performed at [facility], [address], [place], [place] [address]  Heparin level (unfractionated)     Status: None   Collection Time: 06/13/17  3:55 AM  Result Value Ref Range   Heparin Unfractionated 0.58 0.30 - 0.70 IU/mL    Comment: (NOTE) If heparin results are below expected values, and patient dosage has  been confirmed, suggest follow up testing of antithrombin III levels. Performed at [facility], [place] [address] [place], [place] [address]   Basic metabolic panel     Status: Abnormal   Collection Time: 06/13/17  3:55 AM  Result Value Ref Range   Sodium 139 135 - 145 mmol/L   Potassium 4.1 3.5 - 5.1 mmol/L   Chloride 107 101 - 111 mmol/L   CO2 23 22 - 32 mmol/L   Glucose, Bld 81 65 - 99 mg/dL   BUN 21 (H) 6 - 20 mg/dL    Creatinine, Ser 1.03 0.61 - 1.24 mg/dL   Calcium 9.5 8.9 - 10.3 mg/dL   GFR calc non Af Amer >60 >60 mL/min   GFR calc Af Amer >60 >60 mL/min    Comment: (NOTE) The eGFR has been calculated using the CKD EPI equation. This calculation has not been validated in all clinical situations. eGFR's persistently <60 mL/min signify possible Chronic Kidney Disease.    Anion gap 9 5 - 15    Comment: Performed at [facility] [address], [place], [place] [address]  Prepare RBC (crossmatch)     Status: None   Collection Time: 06/13/17 12:59 PM  Result Value Ref Range   Order Confirmation      ORDER PROCESSED BY BLOOD BANK Performed at [facility], [place] [address] [place], [place] [address]   Type and screen [place]     Status: None (Preliminary result)   Collection Time: 06/13/17  1:00 PM  Result Value Ref Range   ABO/RH(D) AB POS    Antibody Screen NEG    Sample Expiration 06/16/2017    Unit Number [id number]    Blood Component Type RED CELLS,LR    Unit division 00    Status of Unit ALLOCATED    Transfusion Status OK TO TRANSFUSE    Crossmatch Result      Compatible Performed at [facility], [place] [address], [place], [place] [address]    Unit Number [id number]    Blood Component Type RED CELLS,LR    Unit division 00    Status of Unit ALLOCATED    Transfusion Status OK TO TRANSFUSE    Crossmatch Result Compatible   Heparin level (unfractionated)  Status: None   Collection Time: 06/14/17  4:34 AM  Result Value Ref Range   Heparin Unfractionated 0.69 0.30 - 0.70 IU/mL    Comment: (NOTE) If heparin results are below expected values, and patient dosage has  been confirmed, suggest follow up testing of antithrombin III levels. Performed at [facility], [place] [address], [place], [place] [address]   Basic metabolic panel     Status: Abnormal   Collection Time: 06/14/17  4:34 AM  Result Value Ref Range   Sodium 140 135 -  145 mmol/L   Potassium 4.1 3.5 - 5.1 mmol/L   Chloride 105 101 - 111 mmol/L   CO2 25 22 - 32 mmol/L   Glucose, Bld 81 65 - 99 mg/dL   BUN 22 (H) 6 - 20 mg/dL   Creatinine, Ser 1.16 0.61 - 1.24 mg/dL   Calcium 9.4 8.9 - 10.3 mg/dL   GFR calc non Af Amer >60 >60 mL/min   GFR calc Af Amer >60 >60 mL/min    Comment: (NOTE) The eGFR has been calculated using the CKD EPI equation. This calculation has not been validated in all clinical situations. eGFR's persistently <60 mL/min signify possible Chronic Kidney Disease.    Anion gap 10 5 - 15    Comment: Performed at [facility] [address] [place] [address]  CBC     Status: None   Collection Time: 06/14/17  4:34 AM  Result Value Ref Range   WBC 4.9 4.0 - 10.5 K/uL   RBC 4.74 4.22 - 5.81 MIL/uL   Hemoglobin 14.5 13.0 - 17.0 g/dL   HCT 44.3 39.0 - 52.0 %   MCV 93.5 78.0 - 100.0 fL   MCH 30.6 26.0 - 34.0 pg   MCHC 32.7 30.0 - 36.0 g/dL   RDW 13.3 11.5 - 15.5 %   Platelets 240 150 - 400 K/uL    Comment: Performed at [facility], [place] [address], [place], [place] [address]  Glucose, capillary     Status: Abnormal   Collection Time: 06/14/17  5:49 AM  Result Value Ref Range   Glucose-Capillary 110 (H) 65 - 99 mg/dL  I-STAT, chem 8     Status: Abnormal   Collection Time: 06/14/17  7:59 AM  Result Value Ref Range   Sodium 140 135 - 145 mmol/L   Potassium 4.0 3.5 - 5.1 mmol/L   Chloride 103 101 - 111 mmol/L   BUN 21 (H) 6 - 20 mg/dL   Creatinine, Ser 0.80 0.61 - 1.24 mg/dL   Glucose, Bld 93 65 - 99 mg/dL   Calcium, Ion 1.31 1.15 - 1.40 mmol/L   TCO2 26 22 - 32 mmol/L   Hemoglobin 12.9 (L) 13.0 - 17.0 g/dL   HCT 38.0 (L) 39.0 - 52.0 %  I-STAT, chem 8     Status: Abnormal   Collection Time: 06/14/17 11:11 AM  Result Value Ref Range   Sodium 139 135 - 145 mmol/L   Potassium 4.5 3.5 - 5.1 mmol/L   Chloride 103 101 - 111 mmol/L   BUN 20 6 - 20 mg/dL   Creatinine, Ser 0.90 0.61 - 1.24 mg/dL   Glucose, Bld  99 65 - 99 mg/dL   Calcium, Ion 1.29 1.15 - 1.40 mmol/L   TCO2 27 22 - 32 mmol/L   Hemoglobin 11.9 (L) 13.0 - 17.0 g/dL   HCT 35.0 (L) 39.0 - 52.0 %  I-STAT, chem 8  Status: Abnormal   Collection Time: 06/14/17 11:30 AM  Result Value Ref Range   Sodium 140 135 - 145 mmol/L   Potassium 4.6 3.5 - 5.1 mmol/L   Chloride 100 (L) 101 - 111 mmol/L   BUN 18 6 - 20 mg/dL   Creatinine, Ser 0.80 0.61 - 1.24 mg/dL   Glucose, Bld 95 65 - 99 mg/dL   Calcium, Ion 1.12 (L) 1.15 - 1.40 mmol/L   TCO2 28 22 - 32 mmol/L   Hemoglobin 10.2 (L) 13.0 - 17.0 g/dL   HCT 30.0 (L) 39.0 - 52.0 %  I-STAT 3, arterial blood gas (G3+)     Status: Abnormal   Collection Time: 06/14/17 11:33 AM  Result Value Ref Range   pH, Arterial 7.335 (L) 7.350 - 7.450   pCO2 arterial 50.8 (H) 32.0 - 48.0 mmHg   pO2, Arterial 395.0 (H) 83.0 - 108.0 mmHg   Bicarbonate 27.1 20.0 - 28.0 mmol/L   TCO2 29 22 - 32 mmol/L   O2 Saturation 100.0 %   Acid-Base Excess 1.0 0.0 - 2.0 mmol/L   Patient temperature HIDE    Sample type ARTERIAL   I-STAT, chem 8     Status: Abnormal   Collection Time: 06/14/17 12:33 PM  Result Value Ref Range   Sodium 136 135 - 145 mmol/L   Potassium 5.0 3.5 - 5.1 mmol/L   Chloride 101 101 - 111 mmol/L   BUN 20 6 - 20 mg/dL   Creatinine, Ser 0.70 0.61 - 1.24 mg/dL   Glucose, Bld 129 (H) 65 - 99 mg/dL   Calcium, Ion 1.16 1.15 - 1.40 mmol/L   TCO2 27 22 - 32 mmol/L   Hemoglobin 10.5 (L) 13.0 - 17.0 g/dL   HCT 31.0 (L) 39.0 - 52.0 %  Hemoglobin and hematocrit, blood     Status: Abnormal   Collection Time: 06/14/17  1:12 PM  Result Value Ref Range   Hemoglobin 10.5 (L) 13.0 - 17.0 g/dL    Comment: REPEATED TO VERIFY RESULT CALLED TO, READ BACK BY AND VERIFIED WITH: M PRICE RN @ 1325 ON 06/14/17 BY HTEMOCHE    HCT 31.8 (L) 39.0 - 52.0 %    Comment: Performed at [facility], [place] [address], [place], [place] [address]  Platelet count     Status: None   Collection Time: 06/14/17  1:12 PM   Result Value Ref Range   Platelets 200 150 - 400 K/uL    Comment: Performed at [facility], [place] [address] [place], [place] [address]  I-STAT, [place] 8     Status: Abnormal   Collection Time: 06/14/17  1:32 PM  Result Value Ref Range   Sodium 135 135 - 145 mmol/L   Potassium 6.0 (H) 3.5 - 5.1 mmol/L   Chloride 100 (L) 101 - 111 mmol/L   BUN 20 6 - 20 mg/dL   Creatinine, Ser 0.90 0.61 - 1.24 mg/dL   Glucose, Bld 150 (H) 65 - 99 mg/dL   Calcium, Ion 1.16 1.15 - 1.40 mmol/L   TCO2 25 22 - 32 mmol/L   Hemoglobin 10.5 (L) 13.0 - 17.0 g/dL   HCT 31.0 (L) 39.0 - 52.0 %  Prepare fresh frozen plasma     Status: None (Preliminary result)   Collection Time: 06/14/17  2:12 PM  Result Value Ref Range   Unit Number [id number]    Blood Component Type THWPLS APHR1    Unit division 00    Status of  Unit ISSUED    Transfusion Status      OK TO TRANSFUSE Performed at [facility], [place] [address] [place], [place] [address]    Unit Number [id number]    Blood Component Type THW PLS APHR    Unit division A0    Status of Unit ISSUED    Transfusion Status OK TO TRANSFUSE   I-STAT, chem 8     Status: Abnormal   Collection Time: 06/14/17  2:31 PM  Result Value Ref Range   Sodium 138 135 - 145 mmol/L   Potassium 4.9 3.5 - 5.1 mmol/L   Chloride 103 101 - 111 mmol/L   BUN 21 (H) 6 - 20 mg/dL   Creatinine, Ser 1.00 0.61 - 1.24 mg/dL   Glucose, Bld 168 (H) 65 - 99 mg/dL   Calcium, Ion 1.20 1.15 - 1.40 mmol/L   TCO2 23 22 - 32 mmol/L   Hemoglobin 11.2 (L) 13.0 - 17.0 g/dL   HCT 33.0 (L) 39.0 - 52.0 %  I-STAT 3, arterial blood gas (G3+)     Status: Abnormal   Collection Time: 06/14/17  2:36 PM  Result Value Ref Range   pH, Arterial 7.305 (L) 7.350 - 7.450   pCO2 arterial 46.1 32.0 - 48.0 mmHg   pO2, Arterial 141.0 (H) 83.0 - 108.0 mmHg   Bicarbonate 23.0 20.0 - 28.0 mmol/L   TCO2 24 22 - 32 mmol/L   O2 Saturation 99.0 %   Acid-base deficit 3.0 (H) 0.0 - 2.0 mmol/L   Patient  temperature HIDE    Sample type ARTERIAL    Recent Results (from the past 240 hour(s))  MRSA PCR Screening     Status: None   Collection Time: 06/08/17  1:16 AM  Result Value Ref Range Status   MRSA by PCR NEGATIVE NEGATIVE Final    Comment:        The GeneXpert MRSA Assay (FDA approved for NASAL specimens only), is one component of a comprehensive MRSA colonization surveillance program. It is not intended to diagnose MRSA infection nor to guide or monitor treatment for MRSA infections. Performed at [facility], [place] [address], [place], [place] [address]   Surgical pcr screen     Status: None   Collection Time: 06/09/17  9:36 PM  Result Value Ref Range Status   MRSA, PCR NEGATIVE NEGATIVE Final   Staphylococcus aureus NEGATIVE NEGATIVE Final    Comment: (NOTE) The Xpert SA Assay (FDA approved for NASAL specimens in patients 44 years of age and older), is one component of a comprehensive surveillance program. It is not intended to diagnose infection nor to guide or monitor treatment. Performed at [facility], [place] [address], [place], [place] [address]    Creatinine: Recent Labs    06/14/17 0434 06/14/17 0759 06/14/17 1111 06/14/17 1130 06/14/17 1233 06/14/17 1332 06/14/17 1431  CREATININE 1.16 0.80 0.90 0.80 0.70 0.90 1.00   Baseline Creatinine: 0.9  Impression/Assessment:  62yo with urethral stricture  Plan:  1. I discussed the various causes of urethral stricture disease with the patient and the management of urethral stricture disease. We will leave the SP tube in place for 2-3 weeks and the patient will have a VCUG and RUG at [facility] prior to [facility] tube removal. He with likely need a DVIU versus urethroplasty for definitive management of his stricture disease  Nicolette Bang 06/14/2017, 4:12 PM

## 2017-06-14 NOTE — Brief Op Note (Signed)
06/07/2017 - 06/14/2017  7:00 PM  PATIENT:  [name]  62 y.o. male  PRE-OPERATIVE DIAGNOSIS:  CAD  POST-OPERATIVE DIAGNOSIS:  CAD  PROCEDURE:  Procedure(s): CORONARY ARTERY BYPASS GRAFTING (CABG) x four, using left internal mammary artery and right leg greater saphenous vein harvested endoscopically (N/A) TRANSESOPHAGEAL ECHOCARDIOGRAM (TEE) (N/A) CYSTOSCOPY (N/A) INSERTION OF SUPRAPUBIC CATHETER - Lower abdomen (N/A)  SURGEON:  Surgeon(s) and Role:    * Kerin Perna, MD - Primary    * McKenzie, Mardene Celeste, MD - Assisting  PHYSICIAN ASSISTANT: Jari Favre, PA-C    ANESTHESIA:   general  EBL:  317 mL   BLOOD ADMINISTERED:none  DRAINS: ROUTINE   LOCAL MEDICATIONS USED:  NONE  SPECIMEN:  No Specimen  DISPOSITION OF SPECIMEN:  N/A  COUNTS:  YES  DICTATION: .Dragon Dictation  PLAN OF CARE: Admit to inpatient   PATIENT DISPOSITION:  ICU - intubated and hemodynamically stable.   Delay start of Pharmacological VTE agent (>24hrs) due to surgical blood loss or risk of bleeding: yes

## 2017-06-14 NOTE — Transfer of Care (Signed)
Immediate Anesthesia Transfer of Care Note  Patient: [name]  Procedure(s) Performed: CORONARY ARTERY BYPASS GRAFTING (CABG) x four, using left internal mammary artery and right leg greater saphenous vein harvested endoscopically (N/A Chest) TRANSESOPHAGEAL ECHOCARDIOGRAM (TEE) (N/A ) CYSTOSCOPY (N/A Penis) INSERTION OF SUPRAPUBIC CATHETER - Lower abdomen (N/A Abdomen)  Patient Location: SICU  Anesthesia Type:General  Level of Consciousness: sedated and Patient remains intubated per anesthesia plan  Airway & Oxygen Therapy: Patient remains intubated per anesthesia plan and Patient placed on Ventilator (see vital sign flow sheet for setting)  Post-op Assessment: Report given to RN and Post -op Vital signs reviewed and stable  Post vital signs: Reviewed and stable  Last Vitals:  Vitals Value Taken Time  BP 118/66 06/14/2017  4:06 PM  Temp 37.1 C 06/14/2017  4:08 PM  Pulse 89 06/14/2017  4:08 PM  Resp 18 06/14/2017  4:08 PM  SpO2 96 % 06/14/2017  4:08 PM  Vitals shown include unvalidated device data.  Last Pain:  Vitals:   06/14/17 0439  TempSrc: Oral  PainSc:       Patients Stated Pain Goal: 0 (06/14/17 0400)  Complications: No apparent anesthesia complications

## 2017-06-14 NOTE — Anesthesia Procedure Notes (Signed)
Arterial Line Insertion Start/End6/10/2017 7:04 AM, 06/14/2017 7:11 AM Performed by: Rogelia Boga, CRNA, CRNA  Patient location: Pre-op. Preanesthetic checklist: patient identified, IV checked, site marked, risks and benefits discussed, surgical consent, monitors and equipment checked, pre-op evaluation and timeout performed Lidocaine 1% used for infiltration Left, radial was placed Catheter size: 20 G Hand hygiene performed  and maximum sterile barriers used   Attempts: 1 Procedure performed without using ultrasound guided technique. Following insertion, dressing applied and Biopatch. Post procedure assessment: normal and unchanged  Patient tolerated the procedure well with no immediate complications.

## 2017-06-14 NOTE — Anesthesia Procedure Notes (Signed)
Central Venous Catheter Insertion Performed by: Gaynelle Adu, MD, anesthesiologist Start/End6/10/2017 6:50 AM, 06/14/2017 7:05 AM Patient location: Pre-op. Preanesthetic checklist: patient identified, IV checked, site marked, risks and benefits discussed, surgical consent, monitors and equipment checked, pre-op evaluation, timeout performed and anesthesia consent Hand hygiene performed  and maximum sterile barriers used  PA cath was placed.Swan type:thermodilution PA Cath depth:50 Procedure performed without using ultrasound guided technique. Attempts: 1 Patient tolerated the procedure well with no immediate complications.

## 2017-06-14 NOTE — Procedures (Signed)
Extubation Procedure Note  Patient Details:   Name: [name] DOB: [birth year] MRN: [id number]   Airway Documentation:  Airway 8 mm (Active)  Secured at (cm) 23 cm 06/14/2017  8:32 PM  Measured From Lips 06/14/2017  8:32 PM  Secured Location Right 06/14/2017  8:32 PM  Secured By [name] 06/14/2017  8:32 PM  Cuff Pressure (cm H2O) 28 cm H2O 06/14/2017  8:32 PM  Site Condition Dry 06/14/2017  8:32 PM   Vent end date: (not recorded) Vent end time: (not recorded)   Evaluation  O2 sats: stable throughout Complications: No apparent complications Patient did tolerate procedure well. Bilateral Breath Sounds: Clear, Diminished   Yes   Pt extubated prior to weaning mechanics. Pt achieved -26 NIF and 900 VC with great pt effort, Pt had positive cuff leak and able to speak after extubation. Just a little hoarse. Pt extubated to 4L nasal cannula and RN at bedside with IS.   Amahd Morino, Jodelle Green F 06/14/2017, 11:11 PM

## 2017-06-14 NOTE — Anesthesia Procedure Notes (Signed)
Procedure Name: Intubation Date/Time: 06/14/2017 7:52 AM Performed by: Carney Living, CRNA Pre-anesthesia Checklist: Patient identified, Emergency Drugs available, Suction available, Patient being monitored and Timeout performed Patient Re-evaluated:Patient Re-evaluated prior to induction Oxygen Delivery Method: Circle system utilized Preoxygenation: Pre-oxygenation with 100% oxygen Induction Type: IV induction Ventilation: Mask ventilation without difficulty and Oral airway inserted - appropriate to patient size Laryngoscope Size: Mac and 3 Grade View: Grade II Tube type: Oral Tube size: 8.0 mm Number of attempts: 1 Airway Equipment and Method: Stylet Placement Confirmation: ETT inserted through vocal cords under direct vision,  positive ETCO2 and breath sounds checked- equal and bilateral Secured at: 23 (at teeth) cm Tube secured with: Tape Dental Injury: Teeth and Oropharynx as per pre-operative assessment

## 2017-06-14 NOTE — OR Nursing (Signed)
14:25 - 45 minute call to SICU nurse 15:00 - 20 minute call to SICU nurse

## 2017-06-14 NOTE — Progress Notes (Signed)
  Echocardiogram Echocardiogram Transesophageal has been performed.  Celene Skeen 06/14/2017, 8:25 AM

## 2017-06-15 ENCOUNTER — Inpatient Hospital Stay (HOSPITAL_COMMUNITY): Payer: BLUE CROSS/BLUE SHIELD

## 2017-06-15 ENCOUNTER — Encounter (HOSPITAL_COMMUNITY): Payer: Self-pay | Admitting: Cardiothoracic Surgery

## 2017-06-15 DIAGNOSIS — Z951 Presence of aortocoronary bypass graft: Secondary | ICD-10-CM

## 2017-06-15 LAB — COOXEMETRY PANEL
Carboxyhemoglobin: 1.4 % (ref 0.5–1.5)
Carboxyhemoglobin: 1.4 % (ref 0.5–1.5)
Methemoglobin: 1.9 % — ABNORMAL HIGH (ref 0.0–1.5)
Methemoglobin: 1.9 % — ABNORMAL HIGH (ref 0.0–1.5)
O2 Saturation: 68.5 %
O2 Saturation: 60.6 %
Total hemoglobin: 8.8 g/dL — ABNORMAL LOW (ref 12.0–16.0)
Total hemoglobin: 8.3 g/dL — ABNORMAL LOW (ref 12.0–16.0)

## 2017-06-15 LAB — POCT I-STAT 3, ART BLOOD GAS (G3+)
Acid-base deficit: 3 mmol/L — ABNORMAL HIGH (ref 0.0–2.0)
Acid-base deficit: 3 mmol/L — ABNORMAL HIGH (ref 0.0–2.0)
Bicarbonate: 22.1 mmol/L (ref 20.0–28.0)
Bicarbonate: 23 mmol/L (ref 20.0–28.0)
O2 Saturation: 94 %
O2 Saturation: 88 %
Patient temperature: 36.9
Patient temperature: 37.1
TCO2: 23 mmol/L (ref 22–32)
TCO2: 24 mmol/L (ref 22–32)
pCO2 arterial: 39.5 mmHg (ref 32.0–48.0)
pCO2 arterial: 46.3 mmHg (ref 32.0–48.0)
pH, Arterial: 7.355 (ref 7.350–7.450)
pH, Arterial: 7.304 — ABNORMAL LOW (ref 7.350–7.450)
pO2, Arterial: 72 mmHg — ABNORMAL LOW (ref 83.0–108.0)
pO2, Arterial: 61 mmHg — ABNORMAL LOW (ref 83.0–108.0)

## 2017-06-15 LAB — GLUCOSE, CAPILLARY
Glucose-Capillary: 119 mg/dL — ABNORMAL HIGH (ref 65–99)
Glucose-Capillary: 119 mg/dL — ABNORMAL HIGH (ref 65–99)
Glucose-Capillary: 119 mg/dL — ABNORMAL HIGH (ref 65–99)
Glucose-Capillary: 120 mg/dL — ABNORMAL HIGH (ref 65–99)
Glucose-Capillary: 120 mg/dL — ABNORMAL HIGH (ref 65–99)
Glucose-Capillary: 122 mg/dL — ABNORMAL HIGH (ref 65–99)
Glucose-Capillary: 123 mg/dL — ABNORMAL HIGH (ref 65–99)
Glucose-Capillary: 123 mg/dL — ABNORMAL HIGH (ref 65–99)
Glucose-Capillary: 124 mg/dL — ABNORMAL HIGH (ref 65–99)
Glucose-Capillary: 125 mg/dL — ABNORMAL HIGH (ref 65–99)
Glucose-Capillary: 128 mg/dL — ABNORMAL HIGH (ref 65–99)
Glucose-Capillary: 135 mg/dL — ABNORMAL HIGH (ref 65–99)
Glucose-Capillary: 138 mg/dL — ABNORMAL HIGH (ref 65–99)
Glucose-Capillary: 142 mg/dL — ABNORMAL HIGH (ref 65–99)
Glucose-Capillary: 148 mg/dL — ABNORMAL HIGH (ref 65–99)
Glucose-Capillary: 150 mg/dL — ABNORMAL HIGH (ref 65–99)
Glucose-Capillary: 60 mg/dL — ABNORMAL LOW (ref 65–99)

## 2017-06-15 LAB — POCT I-STAT, CHEM 8
BUN: 15 mg/dL (ref 6–20)
BUN: 19 mg/dL (ref 6–20)
Calcium, Ion: 1.21 mmol/L (ref 1.15–1.40)
Calcium, Ion: 1.33 mmol/L (ref 1.15–1.40)
Chloride: 103 mmol/L (ref 101–111)
Chloride: 100 mmol/L — ABNORMAL LOW (ref 101–111)
Creatinine, Ser: 1 mg/dL (ref 0.61–1.24)
Creatinine, Ser: 1.3 mg/dL — ABNORMAL HIGH (ref 0.61–1.24)
Glucose, Bld: 141 mg/dL — ABNORMAL HIGH (ref 65–99)
Glucose, Bld: 120 mg/dL — ABNORMAL HIGH (ref 65–99)
HCT: 27 % — ABNORMAL LOW (ref 39.0–52.0)
HCT: 26 % — ABNORMAL LOW (ref 39.0–52.0)
Hemoglobin: 9.2 g/dL — ABNORMAL LOW (ref 13.0–17.0)
Hemoglobin: 8.8 g/dL — ABNORMAL LOW (ref 13.0–17.0)
Potassium: 4.4 mmol/L (ref 3.5–5.1)
Potassium: 4.6 mmol/L (ref 3.5–5.1)
Sodium: 138 mmol/L (ref 135–145)
Sodium: 136 mmol/L (ref 135–145)
TCO2: 22 mmol/L (ref 22–32)
TCO2: 24 mmol/L (ref 22–32)

## 2017-06-15 LAB — MAGNESIUM
Magnesium: 2.3 mg/dL (ref 1.7–2.4)
Magnesium: 2.3 mg/dL (ref 1.7–2.4)

## 2017-06-15 LAB — CREATININE, SERUM
Creatinine, Ser: 1.34 mg/dL — ABNORMAL HIGH (ref 0.61–1.24)
GFR calc Af Amer: >60 mL/min (ref >60)
GFR calc non Af Amer: 55 mL/min — ABNORMAL LOW (ref >60)

## 2017-06-15 LAB — BPAM FFP
Blood Product Expiration Date: 201906152359
Blood Product Expiration Date: 201906152359
ISSUE DATE / TIME: 201906101455
ISSUE DATE / TIME: 201906101455
Unit Type and Rh: 8400
Unit Type and Rh: 8400

## 2017-06-15 LAB — CBC
HCT: 27.3 % — ABNORMAL LOW (ref 39.0–52.0)
HCT: 26.1 % — ABNORMAL LOW (ref 39.0–52.0)
Hemoglobin: 8.9 g/dL — ABNORMAL LOW (ref 13.0–17.0)
Hemoglobin: 8.5 g/dL — ABNORMAL LOW (ref 13.0–17.0)
MCH: 30.5 pg (ref 26.0–34.0)
MCH: 31.3 pg (ref 26.0–34.0)
MCHC: 32.6 g/dL (ref 30.0–36.0)
MCHC: 32.6 g/dL (ref 30.0–36.0)
MCV: 93.5 fL (ref 78.0–100.0)
MCV: 96 fL (ref 78.0–100.0)
Platelets: 155 10*3/uL (ref 150–400)
Platelets: 143 10*3/uL — ABNORMAL LOW (ref 150–400)
RBC: 2.92 MIL/uL — ABNORMAL LOW (ref 4.22–5.81)
RBC: 2.72 MIL/uL — ABNORMAL LOW (ref 4.22–5.81)
RDW: 13.3 % (ref 11.5–15.5)
RDW: 13.6 % (ref 11.5–15.5)
WBC: 7.7 10*3/uL (ref 4.0–10.5)
WBC: 9.2 10*3/uL (ref 4.0–10.5)

## 2017-06-15 LAB — PREPARE FRESH FROZEN PLASMA: Unit division: 0

## 2017-06-15 LAB — BASIC METABOLIC PANEL
Anion gap: 3 — ABNORMAL LOW (ref 5–15)
BUN: 15 mg/dL (ref 6–20)
CO2: 25 mmol/L (ref 22–32)
Calcium: 8.1 mg/dL — ABNORMAL LOW (ref 8.9–10.3)
Chloride: 109 mmol/L (ref 101–111)
Creatinine, Ser: 1.19 mg/dL (ref 0.61–1.24)
GFR calc Af Amer: >60 mL/min (ref >60)
GFR calc non Af Amer: >60 mL/min (ref >60)
Glucose, Bld: 116 mg/dL — ABNORMAL HIGH (ref 65–99)
Potassium: 4.7 mmol/L (ref 3.5–5.1)
Sodium: 137 mmol/L (ref 135–145)

## 2017-06-15 MED ORDER — ORAL CARE MOUTH RINSE
15.0000 mL | Freq: Two times a day (BID) | OROMUCOSAL | Status: DC
  Administered 2017-06-15 – 2017-06-20 (×9): 15 mL via OROMUCOSAL

## 2017-06-15 MED ORDER — NOREPINEPHRINE 16 MG/250ML-% IV SOLN
2.0000 ug/min | INTRAVENOUS | Status: DC
  Administered 2017-06-15: 9 ug/min via INTRAVENOUS
  Administered 2017-06-16 (×2): 14 ug/min via INTRAVENOUS
  Filled 2017-06-15 (×2): qty 250

## 2017-06-15 MED ORDER — INSULIN ASPART 100 UNIT/ML ~~LOC~~ SOLN
0.0000 [IU] | SUBCUTANEOUS | Status: DC
  Administered 2017-06-15 – 2017-06-16 (×9): 2 [IU] via SUBCUTANEOUS

## 2017-06-15 MED ORDER — INSULIN DETEMIR 100 UNIT/ML ~~LOC~~ SOLN
14.0000 [IU] | Freq: Once | SUBCUTANEOUS | Status: AC
  Administered 2017-06-15: 14 [IU] via SUBCUTANEOUS
  Filled 2017-06-15: qty 0.14

## 2017-06-15 MED ORDER — METOCLOPRAMIDE HCL 5 MG/ML IJ SOLN
10.0000 mg | Freq: Four times a day (QID) | INTRAMUSCULAR | Status: DC
  Administered 2017-06-15 – 2017-06-18 (×12): 10 mg via INTRAVENOUS
  Filled 2017-06-15 (×12): qty 2

## 2017-06-15 MED ORDER — FUROSEMIDE 10 MG/ML IJ SOLN
20.0000 mg | Freq: Two times a day (BID) | INTRAMUSCULAR | Status: DC
  Administered 2017-06-15 – 2017-06-18 (×7): 20 mg via INTRAVENOUS
  Filled 2017-06-15 (×7): qty 2

## 2017-06-15 MED ORDER — ALBUMIN HUMAN 5 % IV SOLN
12.5000 g | Freq: Once | INTRAVENOUS | Status: AC
  Administered 2017-06-15: 12.5 g via INTRAVENOUS

## 2017-06-15 NOTE — Progress Notes (Addendum)
[facility] DAILY ICU PROGRESS NOTE                   [address]            [place] [address]          [phone]   1 Day Post-Op Procedure(s) (LRB): CORONARY ARTERY BYPASS GRAFTING (CABG) x four, using left internal mammary artery and right leg greater saphenous vein harvested endoscopically (N/A) TRANSESOPHAGEAL ECHOCARDIOGRAM (TEE) (N/A) CYSTOSCOPY (N/A) INSERTION OF SUPRAPUBIC CATHETER - Lower abdomen (N/A)  Total Length of Stay:  LOS: 7 days   Subjective: Felt some pain last night in his chest. This morning it is better.   Objective: Vital signs in last 24 hours: Temp:  [98.2 F (36.8 C)-99 F (37.2 C)] 98.8 F (37.1 C) (06/11 0715) Pulse Rate:  [88-124] 92 (06/11 0715) Cardiac Rhythm: Normal sinus rhythm;Bundle branch block (06/11 0000) Resp:  [11-30] 13 (06/11 0715) BP: (93-142)/(53-86) 102/62 (06/11 0700) SpO2:  [88 %-100 %] 93 % (06/11 0715) Arterial Line BP: (78-128)/(39-66) 93/47 (06/11 0715) FiO2 (%):  [40 %-80 %] 40 % (06/10 2100) Weight:  [235 lb 10.8 oz (106.9 kg)] 235 lb 10.8 oz (106.9 kg) (06/11 0600)  Filed Weights   06/13/17 0423 06/14/17 0439 06/15/17 0600  Weight: 222 lb 6.4 oz (100.9 kg) 220 lb 8 oz (100 kg) 235 lb 10.8 oz (106.9 kg)    Weight change: 15 lb 2.8 oz (6.882 kg)   Hemodynamic parameters for last 24 hours: PAP: (22-46)/(4-24) 26/8 CO:  [7.7 L/min-8.9 L/min] 8.5 L/min CI:  [3.4 L/min/m2-3.9 L/min/m2] 3.8 L/min/m2  Intake/Output from previous day: 06/10 0701 - 06/11 0700 In: 7847.6 [I.V.:5283.6; Blood:614; IV Piggyback:1950] Out: 4317 [Urine:3315; Blood:317; Chest Tube:685]  Intake/Output this shift: No intake/output data recorded.  Current Meds: Scheduled Meds: . acetaminophen  1,000 mg Oral Q6H   Or  . acetaminophen (TYLENOL) oral liquid 160 mg/5 mL  1,000 mg Per Tube Q6H  . aspirin EC  325 mg Oral Daily   Or  . aspirin  324 mg Per Tube Daily  . bisacodyl  10 mg Oral Daily   Or  . bisacodyl  10 mg Rectal  Daily  . Chlorhexidine Gluconate Cloth  6 each Topical Daily  . docusate sodium  200 mg Oral Daily  . gabapentin  300 mg Oral QHS  . insulin regular  0-10 Units Intravenous TID WC  . mouth rinse  15 mL Mouth Rinse BID  . metoCLOPramide (REGLAN) injection  10 mg Intravenous Q6H  . metoprolol tartrate  12.5 mg Oral BID   Or  . metoprolol tartrate  12.5 mg Per Tube BID  . [START ON 06/16/2017] pantoprazole  40 mg Oral Daily  . sodium chloride flush  10-40 mL Intracatheter Q12H  . sodium chloride flush  3 mL Intravenous Q12H   Continuous Infusions: . sodium chloride    . sodium chloride    . sodium chloride 20 mL/hr at 06/15/17 0700  . albumin human 250 mL (06/14/17 2030)  . cefUROXime (ZINACEF)  IV Stopped (06/14/17 2141)  . dexmedetomidine (PRECEDEX) IV infusion Stopped (06/15/17 0600)  . DOPamine 3 mcg/kg/min (06/15/17 0700)  . insulin (NOVOLIN-R) infusion 3 Units/hr (06/15/17 0716)  . lactated ringers 20 mL/hr at 06/15/17 0700  . lactated ringers Stopped (06/15/17 0430)  . milrinone 0.25 mcg/kg/min (06/15/17 0700)  . nitroGLYCERIN    . norepinephrine (LEVOPHED) Adult infusion 9 mcg/min (06/15/17 0700)  . vancomycin Stopped (06/14/17 2134)  PRN Meds:.sodium chloride, albumin human, ketorolac, metoprolol tartrate, morphine injection, ondansetron (ZOFRAN) IV, oxyCODONE, sodium chloride flush, sodium chloride flush, traMADol  General appearance: alert, cooperative and no distress Heart: regular rate and rhythm, S1, S2 normal, no murmur, click, rub or gallop Lungs: clear to auscultation bilaterally and diminished in bilateral lower lobes Abdomen: soft, non-tender; bowel sounds normal; no masses,  no organomegaly Extremities: upper and lower extremity edema Wound: dressed with sterile dressing  Lab Results: CBC: Recent Labs    06/14/17 2220 06/15/17 0432  WBC 8.9 7.7  HGB 9.6* 8.9*  HCT 29.1* 27.3*  PLT 151 155   BMET:  Recent Labs    06/14/17 0434  06/14/17 1431  06/14/17 1617 06/14/17 2220 06/15/17 0432  NA 140   < > 138 139  --  137  K 4.1   < > 4.9 4.6  --  4.7  CL 105   < > 103  --   --  109  CO2 25  --   --   --   --  25  GLUCOSE 81   < > 168* 146*  --  116*  BUN 22*   < > 21*  --   --  15  CREATININE 1.16   < > 1.00  --  1.24 1.19  CALCIUM 9.4  --   --   --   --  8.1*   < > = values in this interval not displayed.    CMET: Lab Results  Component Value Date   WBC 7.7 06/15/2017   HGB 8.9 (L) 06/15/2017   HCT 27.3 (L) 06/15/2017   PLT 155 06/15/2017   GLUCOSE 116 (H) 06/15/2017   CHOL 85 06/10/2017   TRIG 89 06/10/2017   HDL 42 06/10/2017   LDLCALC 25 06/10/2017   ALT 19 06/10/2017   AST 22 06/10/2017   NA 137 06/15/2017   K 4.7 06/15/2017   CL 109 06/15/2017   CREATININE 1.19 06/15/2017   BUN 15 06/15/2017   CO2 25 06/15/2017   TSH 3.983 06/10/2017   INR 1.28 06/14/2017   HGBA1C 5.9 (H) 06/10/2017      PT/INR:  Recent Labs    06/14/17 1558  LABPROT 15.9*  INR 1.28   Radiology: Dg Chest Port 1 View  Result Date: 06/14/2017 CLINICAL DATA:  CABG EXAM: PORTABLE CHEST 1 VIEW COMPARISON:  06/10/2017 FINDINGS: Postop CABG. Endotracheal tube in good position. Swan-Ganz catheter right pulmonary artery. NG tube in place. Mediastinal drain and left chest tubes in good position. No pneumothorax. Bilateral airspace disease likely due to mild pulmonary edema. Mild atelectasis in the lung bases IMPRESSION: Postop CABG. Mild bilateral airspace disease likely due to mild edema. Mild bibasilar atelectasis. Negative for pneumothorax. Electronically Signed   By: Marlan Palau M.D.   On: 06/14/2017 16:28     Assessment/Plan: S/P Procedure(s) (LRB): CORONARY ARTERY BYPASS GRAFTING (CABG) x four, using left internal mammary artery and right leg greater saphenous vein harvested endoscopically (N/A) TRANSESOPHAGEAL ECHOCARDIOGRAM (TEE) (N/A) CYSTOSCOPY (N/A) INSERTION OF SUPRAPUBIC CATHETER - Lower abdomen (N/A)  1. CV-continues to  require dopamine, milrinone, and norepi. HR has been in the 90s NSR to ST in the low 100s. BP has been marginal to low-normal.  2. Pulm-chest tube output was since surgery. Continue chest tubes. He is extubated and on on 6L [place]  with god oxygen saturation. CXR-poor image this morning. It is rotated and cut off at the bottom. There continues to be some mild bilateral  airspace disease due to edema and mild atelectasis. Continue to encourage incentive spirometer.  3. Renal-creatinine down to 1.19, electrolytes okay. Weight is up 15 lbs with expected fluid overload.  Continue suprapubic catheter until he follows up with urology outpatient.  4. H and H is 8.9/27.3 this morning. Will trend closely.  5. Platelets trending up 6. Endo-blood glucose level well controlled on insulin.    Plan: Give albumin + calcium and lasix to facilitate diuresis. OOB to chair. Wean norepi as tolerated and once on a lower dose d/c the swan ganz catheter. Continue to use incentive spirometer. Wean supplemental oxygen as tolerated.     Sharlene Dory    06/15/2017 7:56 AM   CABG for ischemic cardiomyopathy Wean drips ,diuresis DM control Agree with above assessment and plan. Pt up OOB in chair this am. P Donata Clay MD

## 2017-06-15 NOTE — Progress Notes (Signed)
RT note-Patient c/o that he only breaths thru his mouth and wants to keep nasal canula in mouth rather than nose at this time. He states that by wearing in nose it makes his heart hurt. I reassured him that he could wear it anyway that made him comfortable.

## 2017-06-15 NOTE — Progress Notes (Signed)
Patient ID: [name], male   DOB: [birth year], 62 y.o.   MRN: [id number] EVENING ROUNDS NOTE :     [address]       [place] [address]             [phone]                 1 Day Post-Op Procedure(s) (LRB): CORONARY ARTERY BYPASS GRAFTING (CABG) x four, using left internal mammary artery and right leg greater saphenous vein harvested endoscopically (N/A) TRANSESOPHAGEAL ECHOCARDIOGRAM (TEE) (N/A) CYSTOSCOPY (N/A) INSERTION OF SUPRAPUBIC CATHETER - Lower abdomen (N/A)  Total Length of Stay:  LOS: 7 days  BP (!) 99/58   Pulse 98   Temp 97.6 F (36.4 C) (Oral)   Resp (!) 24   Ht 6\' 2"  (1.88 m)   Wt 235 lb 10.8 oz (106.9 kg)   SpO2 98%   BMI 30.26 kg/m   .Intake/Output      06/10 0701 - 06/11 0700 06/11 0701 - 06/12 0700   P.O.  144   I.V. (mL/kg) 5283.6 (49.4) 734.3 (6.9)   Blood 614    IV Piggyback 1950    Total Intake(mL/kg) 7847.6 (73.4) 878.3 (8.2)   Urine (mL/kg/hr) 3345 (1.3) 590 (0.5)   Blood 317    Chest Tube 825 650   Total Output 4487 1240   Net +3360.6 -361.7          . sodium chloride    . sodium chloride    . sodium chloride 10 mL/hr at 06/15/17 1800  . cefUROXime (ZINACEF)  IV Stopped (06/15/17 1105)  . dexmedetomidine (PRECEDEX) IV infusion Stopped (06/15/17 0600)  . insulin (NOVOLIN-R) infusion Stopped (06/15/17 1347)  . lactated ringers 10 mL/hr at 06/15/17 1800  . lactated ringers Stopped (06/15/17 0430)  . milrinone 0.25 mcg/kg/min (06/15/17 1800)  . norepinephrine (LEVOPHED) Adult infusion 8 mcg/min (06/15/17 1800)     Lab Results  Component Value Date   WBC 9.2 06/15/2017   HGB 8.5 (L) 06/15/2017   HCT 26.1 (L) 06/15/2017   PLT 143 (L) 06/15/2017   GLUCOSE 120 (H) 06/15/2017   CHOL 85 06/10/2017   TRIG 89 06/10/2017   HDL 42 06/10/2017   LDLCALC 25 06/10/2017   ALT 19 06/10/2017   AST 22 06/10/2017   NA 136 06/15/2017   K 4.6 06/15/2017   CL 100 (L) 06/15/2017   CREATININE 1.34 (H) 06/15/2017   BUN 19  06/15/2017   CO2 25 06/15/2017   TSH 3.983 06/10/2017   INR 1.28 06/14/2017   HGBA1C 5.9 (H) 06/10/2017   MT/chest tubes in , drainage decreasing hgb stable On levaphed and milrinone Cox 64 - 60 today    Delight Ovens MD  Beeper [phone] Office [phone] 06/15/2017 6:42 PM

## 2017-06-16 ENCOUNTER — Inpatient Hospital Stay (HOSPITAL_COMMUNITY): Payer: BLUE CROSS/BLUE SHIELD

## 2017-06-16 ENCOUNTER — Inpatient Hospital Stay: Payer: Self-pay

## 2017-06-16 LAB — CBC
HCT: 25.1 % — ABNORMAL LOW (ref 39.0–52.0)
Hemoglobin: 8 g/dL — ABNORMAL LOW (ref 13.0–17.0)
MCH: 30.4 pg (ref 26.0–34.0)
MCHC: 31.9 g/dL (ref 30.0–36.0)
MCV: 95.4 fL (ref 78.0–100.0)
Platelets: 147 10*3/uL — ABNORMAL LOW (ref 150–400)
RBC: 2.63 MIL/uL — ABNORMAL LOW (ref 4.22–5.81)
RDW: 14 % (ref 11.5–15.5)
WBC: 8.3 10*3/uL (ref 4.0–10.5)

## 2017-06-16 LAB — GLUCOSE, CAPILLARY
Glucose-Capillary: 127 mg/dL — ABNORMAL HIGH (ref 65–99)
Glucose-Capillary: 131 mg/dL — ABNORMAL HIGH (ref 65–99)
Glucose-Capillary: 132 mg/dL — ABNORMAL HIGH (ref 65–99)
Glucose-Capillary: 135 mg/dL — ABNORMAL HIGH (ref 65–99)
Glucose-Capillary: 140 mg/dL — ABNORMAL HIGH (ref 65–99)
Glucose-Capillary: 141 mg/dL — ABNORMAL HIGH (ref 65–99)
Glucose-Capillary: 154 mg/dL — ABNORMAL HIGH (ref 65–99)

## 2017-06-16 LAB — COMPREHENSIVE METABOLIC PANEL
ALT: 75 U/L — ABNORMAL HIGH (ref 17–63)
AST: 72 U/L — ABNORMAL HIGH (ref 15–41)
Albumin: 3.3 g/dL — ABNORMAL LOW (ref 3.5–5.0)
Alkaline Phosphatase: 24 U/L — ABNORMAL LOW (ref 38–126)
Anion gap: 5 (ref 5–15)
BUN: 24 mg/dL — ABNORMAL HIGH (ref 6–20)
CO2: 25 mmol/L (ref 22–32)
Calcium: 8.4 mg/dL — ABNORMAL LOW (ref 8.9–10.3)
Chloride: 105 mmol/L (ref 101–111)
Creatinine, Ser: 1.5 mg/dL — ABNORMAL HIGH (ref 0.61–1.24)
GFR calc Af Amer: 55 mL/min — ABNORMAL LOW (ref >60)
GFR calc non Af Amer: 48 mL/min — ABNORMAL LOW (ref >60)
Glucose, Bld: 144 mg/dL — ABNORMAL HIGH (ref 65–99)
Potassium: 4.9 mmol/L (ref 3.5–5.1)
Sodium: 135 mmol/L (ref 135–145)
Total Bilirubin: 3.8 mg/dL — ABNORMAL HIGH (ref 0.3–1.2)
Total Protein: 5.5 g/dL — ABNORMAL LOW (ref 6.5–8.1)

## 2017-06-16 LAB — COOXEMETRY PANEL
Carboxyhemoglobin: 1.8 % — ABNORMAL HIGH (ref 0.5–1.5)
Methemoglobin: 1.1 % (ref 0.0–1.5)
O2 Saturation: 60.9 %
Total hemoglobin: 8.7 g/dL — ABNORMAL LOW (ref 12.0–16.0)

## 2017-06-16 LAB — PREPARE RBC (CROSSMATCH)

## 2017-06-16 MED ORDER — FE FUMARATE-B12-VIT C-FA-IFC PO CAPS
1.0000 | ORAL_CAPSULE | Freq: Two times a day (BID) | ORAL | Status: DC
Start: 2017-06-16 — End: 2017-06-21
  Administered 2017-06-16 – 2017-06-21 (×11): 1 via ORAL
  Filled 2017-06-16 (×11): qty 1

## 2017-06-16 MED ORDER — FENTANYL CITRATE (PF) 100 MCG/2ML IJ SOLN
25.0000 ug | INTRAMUSCULAR | Status: DC | PRN
  Administered 2017-06-16: 50 ug via INTRAVENOUS
  Filled 2017-06-16: qty 2

## 2017-06-16 MED ORDER — INSULIN DETEMIR 100 UNIT/ML ~~LOC~~ SOLN
14.0000 [IU] | Freq: Every day | SUBCUTANEOUS | Status: DC
  Administered 2017-06-16: 14 [IU] via SUBCUTANEOUS
  Filled 2017-06-16 (×2): qty 0.14

## 2017-06-16 MED ORDER — SODIUM CHLORIDE 0.9% FLUSH: 10.0000 mL | INTRAVENOUS | Status: DC | PRN

## 2017-06-16 MED FILL — Sodium Bicarbonate IV Soln 8.4%: INTRAVENOUS | Qty: 50 | Status: AC

## 2017-06-16 MED FILL — Lidocaine HCl Local Soln Prefilled Syringe 100 MG/5ML (2%): INTRAMUSCULAR | Qty: 15 | Status: AC

## 2017-06-16 MED FILL — Mannitol IV Soln 20%: INTRAVENOUS | Qty: 500 | Status: AC

## 2017-06-16 MED FILL — Electrolyte-R (PH 7.4) Solution: INTRAVENOUS | Qty: 3000 | Status: AC

## 2017-06-16 MED FILL — Heparin Sodium (Porcine) Inj 1000 Unit/ML: INTRAMUSCULAR | Qty: 30 | Status: AC

## 2017-06-16 MED FILL — Heparin Sodium (Porcine) Inj 1000 Unit/ML: INTRAMUSCULAR | Qty: 20 | Status: AC

## 2017-06-16 NOTE — Progress Notes (Signed)
Patient ID: [name], male   DOB: 11-15-1955, 62 y.o.   MRN: [id number] EVENING ROUNDS NOTE :     [address]       [place] [address]             [phone]                 2 Days Post-Op Procedure(s) (LRB): CORONARY ARTERY BYPASS GRAFTING (CABG) x four, using left internal mammary artery and right leg greater saphenous vein harvested endoscopically (N/A) TRANSESOPHAGEAL ECHOCARDIOGRAM (TEE) (N/A) CYSTOSCOPY (N/A) INSERTION OF SUPRAPUBIC CATHETER - Lower abdomen (N/A)  Total Length of Stay:  LOS: 8 days  BP 118/87 (BP Location: Right Arm)   Pulse 91   Temp 97.7 F (36.5 C) (Oral)   Resp (!) 6   Ht 6\' 2"  (1.88 m)   Wt 235 lb 6.4 oz (106.8 kg)   SpO2 92%   BMI 30.22 kg/m   .Intake/Output      06/11 0701 - 06/12 0700 06/12 0701 - 06/13 0700   P.O. 484 200   I.V. (mL/kg) 1172.1 (11) 340 (3.2)   Blood  764   IV Piggyback 200 100   Total Intake(mL/kg) 1856.1 (17.4) 1404 (13.1)   Urine (mL/kg/hr) 980 (0.4) 595 (0.5)   Blood     Chest Tube 1000 100   Total Output 1980 695   Net -123.9 +709          . sodium chloride    . sodium chloride    . sodium chloride 10 mL/hr at 06/16/17 1800  . lactated ringers Stopped (06/16/17 0801)  . lactated ringers Stopped (06/15/17 0430)  . milrinone 0.125 mcg/kg/min (06/16/17 1800)  . norepinephrine (LEVOPHED) Adult infusion 8 mcg/min (06/16/17 1801)     Lab Results  Component Value Date   WBC 8.3 06/16/2017   HGB 8.0 (L) 06/16/2017   HCT 25.1 (L) 06/16/2017   PLT 147 (L) 06/16/2017   GLUCOSE 144 (H) 06/16/2017   CHOL 85 06/10/2017   TRIG 89 06/10/2017   HDL 42 06/10/2017   LDLCALC 25 06/10/2017   ALT 75 (H) 06/16/2017   AST 72 (H) 06/16/2017   NA 135 06/16/2017   K 4.9 06/16/2017   CL 105 06/16/2017   CREATININE 1.50 (H) 06/16/2017   BUN 24 (H) 06/16/2017   CO2 25 06/16/2017   TSH 3.983 06/10/2017   INR 1.28 06/14/2017   HGBA1C 5.9 (H) 06/10/2017   Alert, not walking much Cr 1.5    Delight Ovens MD  Beeper [phone] Office [phone] 06/16/2017 6:40 PM

## 2017-06-16 NOTE — Progress Notes (Addendum)
[address]       [place] [address]             [phone]      2 Days Post-Op Procedure(s) (LRB): CORONARY ARTERY BYPASS GRAFTING (CABG) x four, using left internal mammary artery and right leg greater saphenous vein harvested endoscopically (N/A) TRANSESOPHAGEAL ECHOCARDIOGRAM (TEE) (N/A) CYSTOSCOPY (N/A) INSERTION OF SUPRAPUBIC CATHETER - Lower abdomen (N/A) Subjective: He had some pain last night which prevented him from getting any sleep.   Objective: Vital signs in last 24 hours: Temp:  [97.6 F (36.4 C)-99 F (37.2 C)] 98 F (36.7 C) (06/12 0700) Pulse Rate:  [87-108] 99 (06/12 0700) Cardiac Rhythm: Sinus tachycardia;Bundle branch block;Normal sinus rhythm (06/12 0400) Resp:  [10-36] 10 (06/12 0700) BP: (81-117)/(44-72) 98/67 (06/12 0700) SpO2:  [89 %-100 %] 92 % (06/12 0700) Arterial Line BP: (83-149)/(38-64) 125/52 (06/12 0700) Weight:  [235 lb 6.4 oz (106.8 kg)] 235 lb 6.4 oz (106.8 kg) (06/12 0600)  Hemodynamic parameters for last 24 hours: PAP: (24-27)/(10-12) 27/10 CO:  [7.8 L/min] 7.8 L/min CI:  [3.5 L/min/m2] 3.5 L/min/m2  Intake/Output from previous day: 06/11 0701 - 06/12 0700 In: 1856.1 [P.O.:484; I.V.:1172.1; IV Piggyback:200] Out: 1980 [Urine:980; Chest Tube:1000] Intake/Output this shift: No intake/output data recorded.  General appearance: alert, cooperative and no distress Heart: regular rate and rhythm, S1, S2 normal, no murmur, click, rub or gallop Lungs: clear to auscultation bilaterally and diminished in the lower lobes Abdomen: soft, non-tender; bowel sounds normal; no masses,  no organomegaly Extremities: upper and lower extremity edema Wound: clean and dry covered with a sterile dressing  Lab Results: Recent Labs    06/15/17 1530 06/16/17 0538  WBC 9.2 8.3  HGB 8.5* 8.0*  HCT 26.1* 25.1*  PLT 143* 147*   BMET:  Recent Labs    06/15/17 0432 06/15/17 1501 06/15/17 1530 06/16/17 0538  NA 137 136  --   135  K 4.7 4.6  --  4.9  CL 109 100*  --  105  CO2 25  --   --  25  GLUCOSE 116* 120*  --  144*  BUN 15 19  --  24*  CREATININE 1.19 1.30* 1.34* 1.50*  CALCIUM 8.1*  --   --  8.4*    PT/INR:  Recent Labs    06/14/17 1558  LABPROT 15.9*  INR 1.28   ABG    Component Value Date/Time   PHART 7.355 06/15/2017 0855   HCO3 22.1 06/15/2017 0855   TCO2 24 06/15/2017 1501   ACIDBASEDEF 3.0 (H) 06/15/2017 0855   O2SAT 60.9 06/16/2017 0600   CBG (last 3)  Recent Labs    06/15/17 2355 06/16/17 0010 06/16/17 0431  GLUCAP 131* 127* 154*    Assessment/Plan: S/P Procedure(s) (LRB): CORONARY ARTERY BYPASS GRAFTING (CABG) x four, using left internal mammary artery and right leg greater saphenous vein harvested endoscopically (N/A) TRANSESOPHAGEAL ECHOCARDIOGRAM (TEE) (N/A) CYSTOSCOPY (N/A) INSERTION OF SUPRAPUBIC CATHETER - Lower abdomen (N/A)  1. CV-continues to require milrinone, and norepi. HR has been in the 90s NSR to ST in the low 100s. MAPs 70s-80s 2. Pulm-chest tube output was 1,086ml/24 hours. Continue chest tubes. He is extubated and on on 4L [place] with god oxygen saturation. CXR-appears better this morning. Await official read. Continue to encourage incentive spirometer.  3. Renal-creatinine down to 1.19, electrolytes okay. Weight is up 15 lbs with expected fluid overload.  He remains on Lasix 20mg  IV BID. Continue suprapubic  catheter until he follows up with urology outpatient.  4. H and H is 8.0/25.1 this morning. Will trend closely.  5. Platelets trending up 6. Endo-blood glucose level well controlled on insulin.  Plan: Transfuse 1 unit of blood. Wean Norepi as tolerated. Decrease Milrinone to 0.125. Continue lasix for fluid overload. Continue ambulation around the unit. Leave chest tubes in place for now due to output. Change pain medication to Fentanyl for improved pain control.     LOS: 8 days    Sharlene Dory 06/16/2017  Slow progress  co-ox ok Hope to remove  chest tube slater today patient examined and medical record reviewed,agree with above note. Kathlee Nations Trigt III 06/16/2017

## 2017-06-16 NOTE — Progress Notes (Signed)
Peripherally Inserted Central Catheter/Midline Placement  The IV Nurse has discussed with the patient and/or persons authorized to consent for the patient, the purpose of this procedure and the potential benefits and risks involved with this procedure.  The benefits include less needle sticks, lab draws from the catheter, and the patient may be discharged home with the catheter. Risks include, but not limited to, infection, bleeding, blood clot (thrombus formation), and puncture of an artery; nerve damage and irregular heartbeat and possibility to perform a PICC exchange if needed/ordered by physician.  Alternatives to this procedure were also discussed.  Bard Power PICC patient education guide, fact sheet on infection prevention and patient information card has been provided to patient /or left at bedside.    PICC/Midline Placement Documentation        [name] 06/16/2017, 10:48 AM

## 2017-06-16 NOTE — Care Management Note (Signed)
Case Management Note Donn Pierini RN,BSN Unit [facility] 1-22 Case Manager  [phone]  Patient Details  Name: [name] MRN: [id number] Date of Birth: Jun 03, 1955  Subjective/Objective:  Pt admitted with NSTEMI and HTN urgency.  Cath showed severe 3VD- pt s/p CABGx4 on 06/14/17                  Action/Plan: PTA pt lived at home with spouse, anticipate return home. CM to follow for transition of care needs  Expected Discharge Date:                  Expected Discharge Plan:  Home/Self Care  In-House Referral:  NA  Discharge planning Services  CM Consult  Post Acute Care Choice:    Choice offered to:     DME Arranged:    DME Agency:     HH Arranged:    HH Agency:     Status of Service:  In process, will continue to follow  If discussed at Long Length of Stay Meetings, dates discussed:    Discharge Disposition:   Additional Comments:  Darrold Span, RN 06/16/2017, 10:28 AM

## 2017-06-17 ENCOUNTER — Inpatient Hospital Stay (HOSPITAL_COMMUNITY): Payer: BLUE CROSS/BLUE SHIELD

## 2017-06-17 ENCOUNTER — Ambulatory Visit: Payer: BLUE CROSS/BLUE SHIELD | Admitting: Cardiology

## 2017-06-17 LAB — GLUCOSE, CAPILLARY
Glucose-Capillary: 100 mg/dL — ABNORMAL HIGH (ref 65–99)
Glucose-Capillary: 102 mg/dL — ABNORMAL HIGH (ref 65–99)
Glucose-Capillary: 107 mg/dL — ABNORMAL HIGH (ref 65–99)
Glucose-Capillary: 113 mg/dL — ABNORMAL HIGH (ref 65–99)
Glucose-Capillary: 79 mg/dL (ref 65–99)
Glucose-Capillary: 85 mg/dL (ref 65–99)

## 2017-06-17 LAB — BASIC METABOLIC PANEL
Anion gap: 7 (ref 5–15)
BUN: 21 mg/dL — ABNORMAL HIGH (ref 6–20)
CO2: 28 mmol/L (ref 22–32)
Calcium: 8.3 mg/dL — ABNORMAL LOW (ref 8.9–10.3)
Chloride: 101 mmol/L (ref 101–111)
Creatinine, Ser: 1.18 mg/dL (ref 0.61–1.24)
GFR calc Af Amer: >60 mL/min (ref >60)
GFR calc non Af Amer: >60 mL/min (ref >60)
Glucose, Bld: 114 mg/dL — ABNORMAL HIGH (ref 65–99)
Potassium: 4.5 mmol/L (ref 3.5–5.1)
Sodium: 136 mmol/L (ref 135–145)

## 2017-06-17 LAB — BPAM RBC
Blood Product Expiration Date: 201906182359
Blood Product Expiration Date: 201906252359
ISSUE DATE / TIME: 201906120755
Unit Type and Rh: 6200
Unit Type and Rh: 8400

## 2017-06-17 LAB — TYPE AND SCREEN
ABO/RH(D): AB POS
Antibody Screen: NEGATIVE
Unit division: 0
Unit division: 0

## 2017-06-17 LAB — CBC
HCT: 24.9 % — ABNORMAL LOW (ref 39.0–52.0)
Hemoglobin: 8 g/dL — ABNORMAL LOW (ref 13.0–17.0)
MCH: 31.1 pg (ref 26.0–34.0)
MCHC: 32.1 g/dL (ref 30.0–36.0)
MCV: 96.9 fL (ref 78.0–100.0)
Platelets: 115 10*3/uL — ABNORMAL LOW (ref 150–400)
RBC: 2.57 MIL/uL — ABNORMAL LOW (ref 4.22–5.81)
RDW: 14.3 % (ref 11.5–15.5)
WBC: 7.1 10*3/uL (ref 4.0–10.5)

## 2017-06-17 LAB — COOXEMETRY PANEL
Carboxyhemoglobin: 1.6 % — ABNORMAL HIGH (ref 0.5–1.5)
Methemoglobin: 1.8 % — ABNORMAL HIGH (ref 0.0–1.5)
O2 Saturation: 81.4 %
Total hemoglobin: 8 g/dL — ABNORMAL LOW (ref 12.0–16.0)

## 2017-06-17 LAB — BPAM FFP
Blood Product Expiration Date: 201906172359
ISSUE DATE / TIME: 201906121032
Unit Type and Rh: 8400

## 2017-06-17 LAB — PREPARE FRESH FROZEN PLASMA

## 2017-06-17 MED ORDER — INSULIN ASPART 100 UNIT/ML ~~LOC~~ SOLN
0.0000 [IU] | Freq: Three times a day (TID) | SUBCUTANEOUS | Status: DC
  Administered 2017-06-17: 2 [IU] via SUBCUTANEOUS

## 2017-06-17 MED ORDER — FENTANYL CITRATE (PF) 100 MCG/2ML IJ SOLN: 25.0000 ug | INTRAMUSCULAR | Status: DC | PRN

## 2017-06-17 MED ORDER — CARVEDILOL 3.125 MG PO TABS
3.1250 mg | ORAL_TABLET | Freq: Two times a day (BID) | ORAL | Status: DC
  Administered 2017-06-18 – 2017-06-19 (×4): 3.125 mg via ORAL
  Filled 2017-06-17 (×4): qty 1

## 2017-06-17 MED ORDER — INSULIN ASPART 100 UNIT/ML ~~LOC~~ SOLN: 0.0000 [IU] | Freq: Every day | SUBCUTANEOUS | Status: DC

## 2017-06-17 MED FILL — Magnesium Sulfate Inj 50%: INTRAMUSCULAR | Qty: 10 | Status: AC

## 2017-06-17 MED FILL — Potassium Chloride Inj 2 mEq/ML: INTRAVENOUS | Qty: 40 | Status: AC

## 2017-06-17 MED FILL — Heparin Sodium (Porcine) Inj 1000 Unit/ML: INTRAMUSCULAR | Qty: 30 | Status: AC

## 2017-06-17 MED FILL — Dexmedetomidine HCl in NaCl 0.9% IV Soln 400 MCG/100ML: INTRAVENOUS | Qty: 100 | Status: AC

## 2017-06-17 NOTE — Progress Notes (Signed)
CT Surgery  Patient examined and record reviewed.Hemodynamics stable,labs satisfactory.Patient had stable day.Continue current care.Walked 300 ft Kathlee Nations Trigt III 06/17/2017

## 2017-06-17 NOTE — Progress Notes (Addendum)
[address]       [place] [address]             [phone]      3 Days Post-Op Procedure(s) (LRB): CORONARY ARTERY BYPASS GRAFTING (CABG) x four, using left internal mammary artery and right leg greater saphenous vein harvested endoscopically (N/A) TRANSESOPHAGEAL ECHOCARDIOGRAM (TEE) (N/A) CYSTOSCOPY (N/A) INSERTION OF SUPRAPUBIC CATHETER - Lower abdomen (N/A) Subjective: Feels better this morning. Was able to sleep a few hours last night.   Objective: Vital signs in last 24 hours: Temp:  [97.7 F (36.5 C)-99 F (37.2 C)] 98 F (36.7 C) (06/13 0725) Pulse Rate:  [87-100] 88 (06/13 0600) Cardiac Rhythm: Normal sinus rhythm;Bundle branch block (06/13 0000) Resp:  [6-25] 8 (06/13 0600) BP: (88-134)/(61-113) 113/72 (06/13 0725) SpO2:  [92 %-98 %] 95 % (06/13 0600) Arterial Line BP: (117-118)/(50-60) 117/60 (06/12 0845) Weight:  [106.3 kg (234 lb 5.6 oz)] 106.3 kg (234 lb 5.6 oz) (06/13 0600)     Intake/Output from previous day: 06/12 0701 - 06/13 0700 In: 1756.3 [P.O.:320; I.V.:572.3; Blood:764; IV Piggyback:100] Out: 9147 [WGNFA:2130; Chest Tube:150] Intake/Output this shift: No intake/output data recorded.  General appearance: alert, cooperative and no distress Heart: regular rate and rhythm, S1, S2 normal, no murmur, click, rub or gallop Lungs: clear to auscultation bilaterally Abdomen: soft, non-tender; bowel sounds normal; no masses,  no organomegaly Extremities: upper and lower extremity edema Wound: small amount of drainage on sternal dressing.   Lab Results: Recent Labs    06/16/17 0538 06/17/17 0443  WBC 8.3 7.1  HGB 8.0* 8.0*  HCT 25.1* 24.9*  PLT 147* 115*   BMET:  Recent Labs    06/16/17 0538 06/17/17 0443  NA 135 136  K 4.9 4.5  CL 105 101  CO2 25 28  GLUCOSE 144* 114*  BUN 24* 21*  CREATININE 1.50* 1.18  CALCIUM 8.4* 8.3*    PT/INR:  Recent Labs    06/14/17 1558  LABPROT 15.9*  INR 1.28   ABG      Component Value Date/Time   PHART 7.355 06/15/2017 0855   HCO3 22.1 06/15/2017 0855   TCO2 24 06/15/2017 1501   ACIDBASEDEF 3.0 (H) 06/15/2017 0855   O2SAT 81.4 06/17/2017 0645   CBG (last 3)  Recent Labs    06/17/17 0003 06/17/17 0358 06/17/17 0730  GLUCAP 107* 102* 100*    Assessment/Plan: S/P Procedure(s) (LRB): CORONARY ARTERY BYPASS GRAFTING (CABG) x four, using left internal mammary artery and right leg greater saphenous vein harvested endoscopically (N/A) TRANSESOPHAGEAL ECHOCARDIOGRAM (TEE) (N/A) CYSTOSCOPY (N/A) INSERTION OF SUPRAPUBIC CATHETER - Lower abdomen (N/A)  1. CV-continues to require milrinone, and norepi. HR has been in the 90s NSR to ST in the low 100s. MAPs 70s-80s 2. Pulm-chest tube output was 116ml/24 hours. Continue chest tubes. He is extubated and on on 2L [place] with god oxygen saturation. CXR-appears better this morning. No pneumothorax. Continue to encourage incentive spirometer.  3. Renal-creatinine down to 1.18, electrolytes okay. Weight is up 14 lbs with expected fluid overload. He remains on Lasix 20mg  IV BID. Continue suprapubic catheter until he follows up with urology outpatient.  4. H and H is 8.0/25.1 this morning. Will trend closely.  5. Platelets trending up 6. Endo-blood glucose level well controlled on insulin.   Plan: consult PT for debility. Turn off Milrinone, coox is 81.4 this morning. Continue to wean Levophed as tolerated. Continue diuretics for fluid overload.    LOS: 9  days    Sharlene Dory 06/17/2017  POD 3 and BP better Will wean off milrinone and NE and start low dose coreg tomorrow DC chest tubes Supra pubic tube needs to stay for several weeks Expected postop blood loss anemia Agree with above note and plan - hope to send to stepdown bed later today

## 2017-06-17 NOTE — Op Note (Signed)
NAME: ANTERO, DEROSIA MEDICAL RECORD UJ:[id number] ACCOUNT [id number] DATE OF BIRTH:05-Jul-1955 FACILITY: [facility] LOCATION: [facility] PHYSICIAN:Tamas Suen VAN TRIGT III, MD  OPERATIVE REPORT  DATE OF PROCEDURE:  06/14/2017  PROCEDURE PERFORMED: 1.  Coronary artery bypass grafting x4 (left internal mammary artery to left anterior descending, saphenous vein graft to diagonal, saphenous vein graft to ramus intermedius, saphenous vein graft to right coronary artery). 2.  Placement of left ventricular epicardial lead which was tunneled to the left infraclavicular space. 3.  Endoscopic harvest of right leg greater saphenous vein.  SURGEON:  Kathlee Nations Trigt III, MD  ASSISTANT:  Jari Favre, PA-C  ANESTHESIA:  General.  PREOPERATIVE DIAGNOSES:  Non-ST elevation myocardial infarction, severe 3-vessel coronary artery disease, ischemic cardiomyopathy, mild-moderate aortic insufficiency, previous percutaneous coronary intervention.  POSTOPERATIVE DIAGNOSES:  Non-ST elevation myocardial infarction, severe 3-vessel coronary artery disease, ischemic cardiomyopathy, mild-moderate aortic insufficiency, previous percutaneous coronary intervention.  CLINICAL NOTE:  The patient is a 62 year old reformed smoker who presented with symptoms of pain and shortness of breath and positive cardiac enzymes.  He underwent cardiac catheterization and an echocardiogram, the images of which I personally reviewed.   He was found to have severe 3-vessel coronary artery disease, LV dysfunction, mild-moderate AI, and his cardiologist recommended multivessel coronary artery bypass grafting.  I agreed with that recommendation and discussed the procedure of CABG in  detail with the patient and his family.  I discussed the details of surgery including the use of general anesthesia and cardiopulmonary bypass, the location of surgical incisions, and the expected postoperative hospital recovery.  His cardiologist  recommended placing an LV  lateral lead at the time of surgery in case the patient needed biventricular pacing for LV dysfunction in the future.  I discussed the risks to him of CABG including the risk of stroke, bleeding, infection, postoperative  pulmonary problems including pleural effusion, postoperative infection, postoperative organ failure, and postoperative death.  After reviewing these issues, he demonstrated his understanding and agreed to proceed with surgery under what I felt was an  informed consent.  OPERATIVE FINDINGS: 1.  Significant scarring of the LV apex, inferior wall but with global LV improvement after separation from cardiopulmonary bypass by TEE. 2.  Aortic valve was not replaced due to the patient's severe LV dysfunction and the mild-moderate degree of aortic insufficiency. 3.  Perioperative coagulopathy requiring clotting factor component therapy.  DESCRIPTION OF PROCEDURE:  The patient was brought from preoperative holding to the operating room and placed supine on the operating table where general anesthesia was induced under invasive hemodynamic monitoring.  He remained stable.  A  transesophageal echo probe was placed by the anesthesia team.  The patient was prepped and draped as a sterile field, and a proper time-out was performed.  A sternal incision was made.  The saphenous vein was harvested endoscopically from the right leg.   The left internal mammary artery was harvested as a pedicle graft from its origin at the subclavian vessels.  The sternal retractor was placed and the pericardium opened.  Pursestring sutures were placed in the ascending aorta and right atrium and  heparin was administered when the vein was harvested.  The patient was cannulated and placed on bypass.  The coronaries were identified for grafting.  The mammary artery and vein grafts were prepared for the distal anastomosis.  A cardioplegia cannula  was replaced for both antegrade and retrograde cold blood cardioplegia.  The  patient was cooled to 32 degrees, and the aortic crossclamp was applied.  One liter of cold blood cardioplegia was delivered in split doses between the antegrade aortic and  retrograde coronary sinus catheters.  There was good cardioplegic arrest and supple temperature drop less than 14 degrees.  Cardioplegia was delivered every 20 minutes.  The distal coronary anastomoses were performed.  The first distal anastomosis was to the RCA.  This had a tight proximal stenosis.  Reverse saphenous vein was sewn end-to-side with running 7-0 Prolene with good flow through the graft.  Cardioplegia was  redosed.  The second distal anastomosis was the ramus branch of the left coronary.  This had a proximal 80% to 90% stenosis.  The reverse saphenous vein was sewn in a side-running 7-0 Prolene with good flow through the graft.  Cardioplegia was redosed.  A third distal anastomosis was to the diagonal branch of the LAD.  This had an ostial tight stenosis, and a reverse saphenous vein was sewn end-to-side with running 7-0 Prolene with good flow through the graft.  Cardioplegia was redosed.  The fourth distal anastomosis was to the distal third of the LAD.  There was a proximal high-grade stenosis.  The left IMA pedicle was brought through an opening in the left lateral pericardium.  It was brought down onto the LAD and sewn end-to-side with  a running 8-0 Prolene.  There was good flow through the anastomosis after briefly releasing the pedicle bulldog on the mammary artery.  The bulldog was reapplied.  An epicardial 2-screw lead was then placed in the lateral LV and secured with 6-0 Prolene sutures.  The lead was tunneled through the chest wall into a pocket beneath the left clavicle.  While the crossclamp was still in place, 3 proximal vein anastomoses were performed on the ascending aorta using a 4.5 mm punch and running 6-0 Prolene.  Prior to tying down the final proximal anastomosis, air was vented from the  coronaries with a dose  of retrograde warm blood cardioplegia.  The crossclamp was removed.  The vein grafts were deaired and opened, and each had good flow.  Hemostasis was documented at the proximal and distal anastomoses.  The cardioplegia cannula was removed.  Temporary pacing wires were applied.  The patient was reperfused and rewarmed.   The lungs were expanded and the ventilator was resumed.  The patient was weaned off cardiopulmonary bypass on low level of milrinone and dopamine with excellent hemodynamics.  Echo showed improved global LV function.  Protamine was administered without adverse reaction.  The cannula was removed.  The patient had diffuse coagulopathy after reversal of heparin and was given FFP with improved coagulation function.  The superior pericardial fat was closed over the aorta.  The anterior mediastinal and left pleural chest tubes were placed and brought  out through separate incisions.  The sternum was closed with a wire.  The pectoralis fascia was closed with a running #1 Vicryl.  The subcutaneous and skin layers were closed using running Vicryl and sterile dressings were applied.  Total cardiopulmonary  bypass time was 161 minutes.  LN/NUANCE  D:06/17/2017 T:06/17/2017 JOB:[id number]

## 2017-06-17 NOTE — Discharge Summary (Addendum)
[address]       [place] [address]             [phone]      Physician Discharge Summary  Patient ID: [name] MRN: [id number] DOB/AGE: 02-26-1955 62 y.o.  Admit date: 06/07/2017 Discharge date: 06/21/2017  Admission Diagnoses: Patient Active Problem List   Diagnosis Date Noted  . Acute pulmonary edema (HCC)   . Acute on chronic combined systolic and diastolic CHF (congestive heart failure) (HCC)   . ACS (acute coronary syndrome) (HCC) 06/08/2017  . Pain in the chest   . Gout 08/30/2014  . Neck pain, chronic 12/17/2010  . Back pain, chronic 12/01/2010  . Pericarditis 10/02/2010  . CAD (coronary artery disease) 05/14/2010  . Hypertension   . Hypercholesterolemia   . LBBB (left bundle branch block)   . NSTEMI (non-ST elevated myocardial infarction) [facility])     Discharge Diagnoses:  Active Problems:   NSTEMI (non-ST elevated myocardial infarction) (HCC)   ACS (acute coronary syndrome) (HCC)   Acute pulmonary edema (HCC)   Acute on chronic combined systolic and diastolic CHF (congestive heart failure) (HCC)   S/P CABG x 4   Discharged Condition: good  HPI:  The patient is a 62 year old male who presented to the emergency department on 06/07/2017 with chief complaint of flash pulmonary edema.  The patient has a previous history of coronary artery disease as well as familial hyperlipidemia on Repatha.  He has had multiple PCI in the past with most recent in 2016.  LVH in 2016 was preserved and on a SPECT scan in 2018 he was noted to have dropped into the 30 percentage.  On the date of admission he presented with acute onset of shortness of breath with cough.  He described a productive cough including yellow-tinged phlegm.  EMS was called and he was noted to be hypertensive with systolic in the 190s.  Upon presentation to the emergency department initial chest x-ray showed interstitial pulmonary edema.  CT scan for pulmonary embolism was negative.  He  initially got a good response to diuretics and oxygen and blood pressure returned to normal.  He was felt to require admission for further evaluation and treatment to include cardiology consultation. He did rule in for non-STEMI.  Peak troponin I is 0.28.  He was felt to be a candidate for cardiac catheterization this revealed severe three-vessel coronary artery disease.  Please see the detailed report below.  We are asked to see the patient in cardiothoracic surgical consultation for consideration of coronary artery surgical revascularization.  Ejection fraction is noted to be severely impaired  at 20% on catheterization.  An echocardiogram was obtained today but the result is currently pending to the chart.  He does have a history of significant tobacco abuse of 2 packs/day but did quit approximately 18 years ago.     Hospital Course:   On 06/14/2017 Mr. Monks underwent a coronary bypass grafting x4 with Dr. Donata Clay.  He tolerated the procedure well and was transferred to the surgical ICU for continued care.  He was extubated timely manner.  He also had a suprapubic catheter placed at this time due to a urethral stricture.  Postop day 1 he continued to require dopamine, milrinone, and norepinephrine.  He remained in normal sinus rhythm.  He was slightly hypotensive.  His chest tube output was increasing therefore we continued his chest tubes.  He was extubated to 6 L nasal cannula  with good oxygen saturation.  His creatinine began to trend down but his weight remained about 15 pounds fluid overloaded.  He had a stable hemoglobin and hematocrit.  We gave him some albumin with calcium and Lasix to facilitate diuresis.  We worked on weaning his norepinephrine as tolerated and discontinued his Swan-Ganz catheter once he was on lower doses of norepinephrine.  Postop day 2 we continued his chest tubes due to output.  Blood pressure was much better and he was on less norepinephrine.  We decreased his milrinone  due to his co-ox being satisfactory.  He changed his pain medicine to fentanyl for better control.  We continue Lasix for fluid overload.  We transfuse 1 unit of blood to help with his blood pressure.  We continue to wean oxygen as tolerated.  Postop day 3 we discontinued his milrinone and continue to wean his norepinephrine.  He remained in normal sinus rhythm.  We were able to discontinue his chest tubes due to decreased output.  We continued a diuretic regimen.  We continued his suprapubic catheter which will be kept in place until he sees urology outpatient.  His hemoglobin and hematocrit remained stable.  We consulted physical therapy for debility. He continued to progress on the floor. Today, he is ambulating with limited assistance, tolerating room air, his incisions are healing well, and he is ready for discharge home with his family. He will follow-up in the office tomorrow with urology.     Consults: Respiratory therapy  Significant Diagnostic Studies:  CLINICAL DATA:  Cough  EXAM: PORTABLE CHEST 1 VIEW  COMPARISON:  06/16/2017  FINDINGS: Prior CABG. Left chest tube remains in place, unchanged. Right PICC line has been placed with the tip at the cavoatrial junction. Cardiomegaly with vascular congestion. Left perihilar and bibasilar atelectasis.  IMPRESSION: Left chest tube remains in place without pneumothorax.  Right PICC line placement with the tip at the cavoatrial junction.  Cardiomegaly, vascular congestion and areas of atelectasis.   Electronically Signed   By: Charlett Nose M.D.   On: 06/17/2017 07:23  Treatments:   Needs dictated  Discharge Exam: Blood pressure (!) 100/53, pulse 83, temperature 99 F (37.2 C), temperature source Oral, resp. rate 18, height 6\' 2"  (1.88 m), weight 103.8 kg (228 lb 14.4 oz), SpO2 91 %.    General appearance: alert, cooperative and no distress Heart: regular rate and rhythm, S1, S2 normal, no murmur, click, rub or  gallop Lungs: clear to auscultation bilaterally Abdomen: soft, non-tender; bowel sounds normal; no masses,  no organomegaly Extremities: extremities normal, atraumatic, no cyanosis or edema Wound: clean and dry    Disposition: Discharge disposition: 01-Home or Self Care       Discharge Instructions    Amb Referral to Cardiac Rehabilitation   Complete by:  As directed    Diagnosis:   CABG NSTEMI     CABG X ___:  4   Discharge patient   Complete by:  As directed    With follow-up with urology tomorrow at 1pm.   Discharge disposition:  01-Home or Self Care   Discharge patient date:  06/21/2017     Allergies as of 06/21/2017      Reactions   Ezetimibe Other (See Comments)   Joint pain and drops BP   Niacin Other (See Comments)   Drops BP and severe   Statins Other (See Comments)   Severe joint pain and drops BP.      Medication List    STOP  taking these medications   amLODipine 5 MG tablet Commonly known as:  NORVASC   aspirin 81 MG tablet Replaced by:  aspirin 325 MG EC tablet   ramipril 2.5 MG capsule Commonly known as:  ALTACE   REPATHA SURECLICK 140 MG/ML Soaj Generic drug:  Evolocumab     TAKE these medications   aspirin 325 MG EC tablet Take 1 tablet (325 mg total) by mouth daily. Start taking on:  06/22/2017 Replaces:  aspirin 81 MG tablet   carvedilol 6.25 MG tablet Commonly known as:  COREG Take 1 tablet (6.25 mg total) by mouth 2 (two) times daily with a meal.   fenofibrate 145 MG tablet Commonly known as:  TRICOR TAKE 1 TABLET BY MOUTH EVERY DAY   furosemide 40 MG tablet Commonly known as:  LASIX Take 1 tablet (40 mg total) by mouth daily. Start taking on:  06/22/2017   gabapentin 300 MG capsule Commonly known as:  NEURONTIN Take 300 mg by mouth at bedtime.   lisinopril 2.5 MG tablet Commonly known as:  PRINIVIL,ZESTRIL Take 1 tablet (2.5 mg total) by mouth daily. Start taking on:  06/22/2017   MENS MULTIVITAMIN PLUS PO Take 1  tablet by mouth 2 (two) times daily.   oxyCODONE 5 MG immediate release tablet Commonly known as:  Oxy IR/ROXICODONE Take 1 tablet (5 mg total) by mouth every 4 (four) hours as needed for severe pain.   potassium chloride SA 20 MEQ tablet Commonly known as:  K-DUR,KLOR-CON Take 1 tablet (20 mEq total) by mouth daily. Start taking on:  06/22/2017   zolpidem 10 MG tablet Commonly known as:  AMBIEN Take 10 mg by mouth at bedtime.      Follow-up Information    Kerin Perna, MD Follow up.   Specialty:  Cardiothoracic Surgery Why:  Your routine follow-up appointment is on July 17th at 3:30 PM.  Please arrive at 3 PM for a chest x-ray located at [facility] imaging which is in the first floor of our building. Contact information: [address] [place] [address] [phone]        Jarome Matin, MD. Call in 1 day(s).   Specialty:  Internal Medicine Contact information: [address] [place] [address] [phone]        [place], Jacquelyn Antony M, MD .   Specialty:  Cardiology Contact information: [address] [place] [address] [phone]        Malen Gauze, MD In 2 weeks.   Specialty:  Urology Why:  Eval suprapubic catheter Contact information: [address] [place] [address] [phone]        Jodelle Gross, NP Follow up.   Specialties:  Nurse Practitioner, Radiology, Cardiology Why:  Joni Reining, NP 6/24 @2pm  ([facility])  Contact information: [address] [place] [address] [phone]        Jetta Lout, NP Follow up.   Specialty:  Urology Why:  Your appointment is on 6/18 at 10:30am. Please bring your medication list.  Contact information: [address] [place] [address] [phone]          The patient has been discharged on:   1.Beta Blocker:  Yes [ x  ]                              No   [   ]  If No,  reason:  2.Ace Inhibitor/ARB: Yes [   ]                                     No  [  x  ]                                     If No, reason: hypotension  3.Statin:   Yes [   ]                  No  [ x  ]                  If No, reason:  4.Ecasa:  Yes  [ x  ]                  No   [   ]                  If No, reason:   Signed: Sharlene Dory 06/21/2017, 1:22 PM   patient examined and medical record reviewed,agree with above note. Kathlee Nations Trigt III 07/09/2017

## 2017-06-17 NOTE — Discharge Instructions (Signed)

## 2017-06-18 ENCOUNTER — Inpatient Hospital Stay (HOSPITAL_COMMUNITY): Payer: BLUE CROSS/BLUE SHIELD

## 2017-06-18 ENCOUNTER — Encounter (HOSPITAL_COMMUNITY): Payer: Self-pay | Admitting: Cardiology

## 2017-06-18 LAB — CBC
HCT: 26.3 % — ABNORMAL LOW (ref 39.0–52.0)
Hemoglobin: 8.4 g/dL — ABNORMAL LOW (ref 13.0–17.0)
MCH: 30.9 pg (ref 26.0–34.0)
MCHC: 31.9 g/dL (ref 30.0–36.0)
MCV: 96.7 fL (ref 78.0–100.0)
Platelets: 134 10*3/uL — ABNORMAL LOW (ref 150–400)
RBC: 2.72 MIL/uL — ABNORMAL LOW (ref 4.22–5.81)
RDW: 14.5 % (ref 11.5–15.5)
WBC: 6.8 10*3/uL (ref 4.0–10.5)

## 2017-06-18 LAB — BASIC METABOLIC PANEL
Anion gap: 7 (ref 5–15)
BUN: 24 mg/dL — ABNORMAL HIGH (ref 6–20)
CO2: 29 mmol/L (ref 22–32)
Calcium: 8.5 mg/dL — ABNORMAL LOW (ref 8.9–10.3)
Chloride: 100 mmol/L — ABNORMAL LOW (ref 101–111)
Creatinine, Ser: 1.05 mg/dL (ref 0.61–1.24)
GFR calc Af Amer: >60 mL/min (ref >60)
GFR calc non Af Amer: >60 mL/min (ref >60)
Glucose, Bld: 106 mg/dL — ABNORMAL HIGH (ref 65–99)
Potassium: 4.1 mmol/L (ref 3.5–5.1)
Sodium: 136 mmol/L (ref 135–145)

## 2017-06-18 LAB — GLUCOSE, CAPILLARY
Glucose-Capillary: 102 mg/dL — ABNORMAL HIGH (ref 65–99)
Glucose-Capillary: 109 mg/dL — ABNORMAL HIGH (ref 65–99)
Glucose-Capillary: 130 mg/dL — ABNORMAL HIGH (ref 65–99)
Glucose-Capillary: 94 mg/dL (ref 65–99)
Glucose-Capillary: 97 mg/dL (ref 65–99)

## 2017-06-18 MED ORDER — FUROSEMIDE 40 MG PO TABS: 40.0000 mg | ORAL_TABLET | Freq: Every day | ORAL | Status: DC

## 2017-06-18 MED ORDER — FENTANYL CITRATE (PF) 100 MCG/2ML IJ SOLN
25.0000 ug | INTRAMUSCULAR | Status: DC | PRN
Start: 2017-06-18 — End: 2017-06-21
  Administered 2017-06-20: 25 ug via INTRAVENOUS
  Filled 2017-06-18: qty 2

## 2017-06-18 MED ORDER — FUROSEMIDE 10 MG/ML IJ SOLN: 20.0000 mg | Freq: Once | INTRAMUSCULAR | Status: AC

## 2017-06-18 MED ORDER — POTASSIUM CHLORIDE CRYS ER 20 MEQ PO TBCR
20.0000 meq | EXTENDED_RELEASE_TABLET | Freq: Every day | ORAL | Status: DC
  Administered 2017-06-18 – 2017-06-21 (×4): 20 meq via ORAL
  Filled 2017-06-18 (×4): qty 1

## 2017-06-18 MED ORDER — FUROSEMIDE 40 MG PO TABS
40.0000 mg | ORAL_TABLET | Freq: Every day | ORAL | Status: DC
  Administered 2017-06-19 – 2017-06-21 (×3): 40 mg via ORAL
  Filled 2017-06-18 (×3): qty 1

## 2017-06-18 MED ORDER — LISINOPRIL 2.5 MG PO TABS
2.5000 mg | ORAL_TABLET | Freq: Every day | ORAL | Status: DC
  Administered 2017-06-18 – 2017-06-21 (×4): 2.5 mg via ORAL
  Filled 2017-06-18 (×4): qty 1

## 2017-06-18 NOTE — Progress Notes (Signed)
4 Days Post-Op Procedure(s) (LRB): CORONARY ARTERY BYPASS GRAFTING (CABG) x four, using left internal mammary artery and right leg greater saphenous vein harvested endoscopically (N/A) TRANSESOPHAGEAL ECHOCARDIOGRAM (TEE) (N/A) CYSTOSCOPY (N/A) INSERTION OF SUPRAPUBIC CATHETER - Lower abdomen (N/A) Subjective: cabg for ischemic CM, EF .25, preop CHF SOB Progressing but still fatigued NSR with LBBB Hb stable after coagulopathy, periop blood loss anemia On coreg and low dose lisinopril, needs further lasix Objective: Vital signs in last 24 hours: Temp:  [97.8 F (36.6 C)-98.1 F (36.7 C)] 98.1 F (36.7 C) (06/14 0827) Pulse Rate:  [79-105] 95 (06/14 0600) Cardiac Rhythm: Normal sinus rhythm (06/14 0600) Resp:  [7-23] 11 (06/14 0600) BP: (96-129)/(50-82) 110/82 (06/14 0600) SpO2:  [88 %-99 %] 93 % (06/14 0600) Weight:  [226 lb 10.1 oz (102.8 kg)] 226 lb 10.1 oz (102.8 kg) (06/14 0500)  Hemodynamic parameters for last 24 hours:  stable  Intake/Output from previous day: 06/13 0701 - 06/14 0700 In: 470.7 [P.O.:240; I.V.:230.7] Out: 2520 [Urine:2520] Intake/Output this shift: No intake/output data recorded.       Exam    General- alert and comfortable    Neck- no JVD, no cervical adenopathy palpable, no carotid bruit   Lungs- clear without rales, wheezes   Cor- regular rate and rhythm, no murmur , gallop   Abdomen- soft, non-tender   Extremities - warm, non-tender, 2+ edema   Neuro- oriented, appropriate, no focal weakness   Lab Results: Recent Labs    06/17/17 0443 06/18/17 0604  WBC 7.1 6.8  HGB 8.0* 8.4*  HCT 24.9* 26.3*  PLT 115* 134*   BMET:  Recent Labs    06/17/17 0443 06/18/17 0604  NA 136 136  K 4.5 4.1  CL 101 100*  CO2 28 29  GLUCOSE 114* 106*  BUN 21* 24*  CREATININE 1.18 1.05  CALCIUM 8.3* 8.5*    PT/INR: No results for input(s): LABPROT, INR in the last 72 hours. ABG    Component Value Date/Time   PHART 7.355 06/15/2017 0855   HCO3  22.1 06/15/2017 0855   TCO2 24 06/15/2017 1501   ACIDBASEDEF 3.0 (H) 06/15/2017 0855   O2SAT 81.4 06/17/2017 0645   CBG (last 3)  Recent Labs    06/17/17 1538 06/17/17 2140 06/18/17 0650  GLUCAP 113* 79 102*    Assessment/Plan: S/P Procedure(s) (LRB): CORONARY ARTERY BYPASS GRAFTING (CABG) x four, using left internal mammary artery and right leg greater saphenous vein harvested endoscopically (N/A) TRANSESOPHAGEAL ECHOCARDIOGRAM (TEE) (N/A) CYSTOSCOPY (N/A) INSERTION OF SUPRAPUBIC CATHETER - Lower abdomen (N/A) Mobilize Diuresis Plan for transfer to step-down: see transfer orders daily lasix   LOS: 10 days    [name] 06/18/2017

## 2017-06-18 NOTE — Evaluation (Signed)
Physical Therapy Evaluation Patient Details Name: [name] MRN: [id number] DOB: [birth year] Today's Date: 06/18/2017   History of Present Illness  62 y.o. male s/p CABG x4 06/14/17. PMH includes: MI, HTN, Gout, CHF, lumbar fusion, cervical fusion.   Clinical Impression  Pt admitted with above diagnosis. Pt currently with functional limitations due to the deficits listed below (see PT Problem List). PTA, pt living with wife in 2 story home, independent with mobility working in [employer]. Upon eval pt presents with post op pain and weakness. Is currently min guard level for all mobility, ambulating the unit on RA with use of Eva walker. PT will cont to follow and progress to stair training. Reviewed sternal precautions.  Pt will benefit from skilled PT to increase their independence and safety with mobility to allow discharge to the venue listed below.     Vitals: After 700' walking: 139/90, HR 95-119max, 95% on RA    Follow Up Recommendations No PT follow up;Supervision for mobility/OOB(OP cardiac rehab )    Equipment Recommendations  (TBD)    Recommendations for Other Services       Precautions / Restrictions Precautions Precautions: Fall;Sternal Restrictions Weight Bearing Restrictions: No      Mobility  Bed Mobility Overal bed mobility: Needs Assistance Bed Mobility: Supine to Sit;Sit to Supine     Supine to sit: Supervision Sit to supine: Supervision   General bed mobility comments: cues for log roll  Transfers Overall transfer level: Needs assistance Equipment used: Fara Boros) Transfers: Sit to/from Stand Sit to Stand: Min guard         General transfer comment: cues for hand placement to adhere to sternal precautions, min guard for safety.   Ambulation/Gait Ambulation/Gait assistance: Min Emergency planning/management officer (Feet): 700 Feet Assistive device: Fara Boros) Gait Pattern/deviations: Step-to pattern;Step-through pattern Gait velocity: slight  decrease   General Gait Details: patient ambulating unit with eva walker on RA, sats in low to mid 90's, HR 95. no LOB,  Stairs            Wheelchair Mobility    Modified Rankin (Stroke Patients Only)       Balance Overall balance assessment: Needs assistance   Sitting balance-Leahy Scale: Fair       Standing balance-Leahy Scale: Fair                               Pertinent Vitals/Pain Pain Assessment: 0-10 Pain Score: 5  Pain Location: chest Pain Descriptors / Indicators: Sharp    Home Living Family/patient expects to be discharged to:: Private residence Living Arrangements: Spouse/significant other Available Help at Discharge: Family;Available 24 hours/day(wife home for first week then back to work ) Type of Home: House Home Access: Stairs to enter Entrance Stairs-Rails: None Entrance Stairs-Number of Steps: 3 Home Layout: Two level Home Equipment: Environmental consultant - 2 wheels      Prior Function Level of Independence: Independent         Comments: independent, driving, works in Air[employer].      Hand Dominance        Extremity/Trunk Assessment   Upper Extremity Assessment Upper Extremity Assessment: Overall WFL for tasks assessed    Lower Extremity Assessment Lower Extremity Assessment: Overall WFL for tasks assessed       Communication   Communication: No difficulties  Cognition Arousal/Alertness: Awake/alert Behavior During Therapy: WFL for tasks assessed/performed Overall Cognitive Status: Within Functional Limits for tasks assessed  General Comments      Exercises     Assessment/Plan    PT Assessment Patient needs continued PT services  PT Problem List Decreased strength;Decreased balance;Decreased activity tolerance;Decreased range of motion;Decreased mobility;Pain;Cardiopulmonary status limiting activity       PT Treatment Interventions DME instruction;Gait  training;Stair training;Functional mobility training;Therapeutic activities;Therapeutic exercise;Balance training    PT Goals (Current goals can be found in the Care Plan section)  Acute Rehab PT Goals Patient Stated Goal: return home when ready PT Goal Formulation: With patient Time For Goal Achievement: 07/02/17 Potential to Achieve Goals: Good    Frequency Min 3X/week   Barriers to discharge        Co-evaluation               AM-PAC PT "6 Clicks" Daily Activity  Outcome Measure Difficulty turning over in bed (including adjusting bedclothes, sheets and blankets)?: A Little Difficulty moving from lying on back to sitting on the side of the bed? : A Little Difficulty sitting down on and standing up from a chair with arms (e.g., wheelchair, bedside commode, etc,.)?: A Little Help needed moving to and from a bed to chair (including a wheelchair)?: A Little Help needed walking in hospital room?: A Little Help needed climbing 3-5 steps with a railing? : A Lot 6 Click Score: 17    End of Session Equipment Utilized During Treatment: (gait belt not used due to pain of chest and abdomen) Activity Tolerance: Patient tolerated treatment well Patient left: in bed;with call bell/phone within reach;with nursing/sitter in room Nurse Communication: Mobility status PT Visit Diagnosis: Unsteadiness on feet (R26.81);Pain;Difficulty in walking, not elsewhere classified (R26.2) Pain - part of body: (Chest/Abdomen)    Time: 1000-1038 PT Time Calculation (min) (ACUTE ONLY): 38 min   Charges:   PT Evaluation $PT Eval Low Complexity: 1 Low PT Treatments $Gait Training: 8-22 mins   PT G Codes:        Etta Grandchild, PT, DPT Acute Rehab Services Pager: [phone]    Etta Grandchild 06/18/2017, 11:23 AM

## 2017-06-18 NOTE — Plan of Care (Signed)
  Problem: Education: Goal: Knowledge of General Education information will improve Outcome: Progressing   Problem: Health Behavior/Discharge Planning: Goal: Ability to manage health-related needs will improve Outcome: Progressing   

## 2017-06-18 NOTE — Progress Notes (Signed)
Offered to walk pt, pt stated he just got back from a walk with the NT. Encouraged the pt to walk at least 3x/day. Reviewed sternal precautions with pt and wife. Also reviewed heart healthy and low sodium diets with pt and wife, handouts given. Will continue to follow.  [id number] Reynold Bowen, RN BSN 06/18/2017 3:01 PM

## 2017-06-19 LAB — BASIC METABOLIC PANEL
Anion gap: 7 (ref 5–15)
BUN: 25 mg/dL — ABNORMAL HIGH (ref 6–20)
CO2: 30 mmol/L (ref 22–32)
Calcium: 8.7 mg/dL — ABNORMAL LOW (ref 8.9–10.3)
Chloride: 100 mmol/L — ABNORMAL LOW (ref 101–111)
Creatinine, Ser: 1.02 mg/dL (ref 0.61–1.24)
GFR calc Af Amer: >60 mL/min (ref >60)
GFR calc non Af Amer: >60 mL/min (ref >60)
Glucose, Bld: 123 mg/dL — ABNORMAL HIGH (ref 65–99)
Potassium: 3.7 mmol/L (ref 3.5–5.1)
Sodium: 137 mmol/L (ref 135–145)

## 2017-06-19 LAB — CBC
HCT: 26 % — ABNORMAL LOW (ref 39.0–52.0)
Hemoglobin: 8.2 g/dL — ABNORMAL LOW (ref 13.0–17.0)
MCH: 30.7 pg (ref 26.0–34.0)
MCHC: 31.5 g/dL (ref 30.0–36.0)
MCV: 97.4 fL (ref 78.0–100.0)
Platelets: 167 10*3/uL (ref 150–400)
RBC: 2.67 MIL/uL — ABNORMAL LOW (ref 4.22–5.81)
RDW: 14.5 % (ref 11.5–15.5)
WBC: 5.8 10*3/uL (ref 4.0–10.5)

## 2017-06-19 LAB — GLUCOSE, CAPILLARY
Glucose-Capillary: 102 mg/dL — ABNORMAL HIGH (ref 65–99)
Glucose-Capillary: 93 mg/dL (ref 65–99)
Glucose-Capillary: 108 mg/dL — ABNORMAL HIGH (ref 65–99)
Glucose-Capillary: 86 mg/dL (ref 65–99)

## 2017-06-19 MED ORDER — ZOLPIDEM TARTRATE 5 MG PO TABS
10.0000 mg | ORAL_TABLET | Freq: Every evening | ORAL | Status: DC | PRN
  Administered 2017-06-19 – 2017-06-20 (×2): 10 mg via ORAL
  Filled 2017-06-19 (×2): qty 2

## 2017-06-19 MED ORDER — SALINE SPRAY 0.65 % NA SOLN
1.0000 | NASAL | Status: DC | PRN
  Administered 2017-06-20: 1 via NASAL
  Filled 2017-06-19: qty 44

## 2017-06-19 NOTE — Progress Notes (Addendum)
[address]       [place] [address]             [phone]      5 Days Post-Op Procedure(s) (LRB): CORONARY ARTERY BYPASS GRAFTING (CABG) x four, using left internal mammary artery and right leg greater saphenous vein harvested endoscopically (N/A) TRANSESOPHAGEAL ECHOCARDIOGRAM (TEE) (N/A) CYSTOSCOPY (N/A) INSERTION OF SUPRAPUBIC CATHETER - Lower abdomen (N/A) Subjective: Not sleeping. Was on Ambien at home for the last 2 years. Will restart.   Objective: Vital signs in last 24 hours: Temp:  [97.6 F (36.4 C)-98.7 F (37.1 C)] 97.6 F (36.4 C) (06/15 0807) Pulse Rate:  [83-99] 87 (06/15 0808) Cardiac Rhythm: Heart block;Bundle branch block (06/15 0700) Resp:  [10-28] 21 (06/15 0807) BP: (103-118)/(65-77) 105/65 (06/15 0808) SpO2:  [91 %-97 %] 97 % (06/15 0432) Weight:  [104.1 kg (229 lb 6.4 oz)] 104.1 kg (229 lb 6.4 oz) (06/15 0432)     Intake/Output from previous day: 06/14 0701 - 06/15 0700 In: 300 [P.O.:240; I.V.:60] Out: 725 [Urine:725] Intake/Output this shift: No intake/output data recorded.  General appearance: alert, cooperative and no distress Heart: regular rate and rhythm, S1, S2 normal, no murmur, click, rub or gallop Lungs: clear to auscultation bilaterally Abdomen: soft, non-tender; bowel sounds normal; no masses,  no organomegaly Extremities: extremities normal, atraumatic, no cyanosis or edema Wound: clean and dry  Lab Results: Recent Labs    06/18/17 0604 06/19/17 0553  WBC 6.8 5.8  HGB 8.4* 8.2*  HCT 26.3* 26.0*  PLT 134* 167   BMET:  Recent Labs    06/18/17 0604 06/19/17 0553  NA 136 137  K 4.1 3.7  CL 100* 100*  CO2 29 30  GLUCOSE 106* 123*  BUN 24* 25*  CREATININE 1.05 1.02  CALCIUM 8.5* 8.7*    PT/INR: No results for input(s): LABPROT, INR in the last 72 hours. ABG    Component Value Date/Time   PHART 7.355 06/15/2017 0855   HCO3 22.1 06/15/2017 0855   TCO2 24 06/15/2017 1501   ACIDBASEDEF 3.0  (H) 06/15/2017 0855   O2SAT 81.4 06/17/2017 0645   CBG (last 3)  Recent Labs    06/18/17 2102 06/18/17 2338 06/19/17 0635  GLUCAP 130* 97 102*    Assessment/Plan: S/P Procedure(s) (LRB): CORONARY ARTERY BYPASS GRAFTING (CABG) x four, using left internal mammary artery and right leg greater saphenous vein harvested endoscopically (N/A) TRANSESOPHAGEAL ECHOCARDIOGRAM (TEE) (N/A) CYSTOSCOPY (N/A) INSERTION OF SUPRAPUBIC CATHETER - Lower abdomen (N/A)  1. CV-Continue coreg, low-dose lisinopril.  HR has been in the 80s NSR. BP is low normal.  2. Pulm-tolerating room air with good oxygen saturation. CXR-  No pneumothorax and small pleural effusions.Continue to encourage incentive spirometer.  3. Renal-creatinine down to 1.02, electrolytes okay. Weight is up 7 lbs with expected fluid overload.He remains on Lasix 40mg  daily PO.Continue suprapubic catheter until he follows up with urology outpatient.  4. H and H is 8.2/26.0this morning. Trending up. 5. Platelets trending up 6. Endo-blood glucose level well controlled on insulin. 7. Insomnia-was on Ambien at home. Will restart. Educated the patient on taking his pain medication several hours before the Ambien.   Plan: Continue Lasix for fluid overload. Rhythm has been stable with a LBBB. No afib on the monitor. Will discontinue EPW. Likely home Monday.    LOS: 11 days    Sharlene Dory 06/19/2017 Patient seen and examined, agree with above  Viviann Spare C. Dorris Fetch, MD [facility] [phone]

## 2017-06-19 NOTE — Progress Notes (Signed)
EPW x3 removed, tips intact. Pt educated on 1 hour of bedrest. VSS. Call light in reach. Will continue to monitor.  Versie Starks, RN

## 2017-06-19 NOTE — Progress Notes (Signed)
CARDIAC REHAB PHASE I   PRE:  Rate/Rhythm: 90  BP:  Sitting: 124/71     SaO2: 92ra  MODE:  Ambulation: 440 ft   POST:  Rate/Rhythm: 90  BP:  Sitting: 124/71     SaO2: 92ra  12:07p-12:45p Patient ambulated using rolling walker. Needed several small rest breaks due to sob. This is the patients 2nd walk this morning. C/o 3/10 sternal pain. Patient back in bed with call bell in reach. Wife had stepped out-RN made aware that when she gets back the couple would like to watch the "Going Home After Heart Surgery" video.   Barnabas Lister Aston Lawhorn, MS 06/19/2017 12:41 PM

## 2017-06-20 LAB — GLUCOSE, CAPILLARY
Glucose-Capillary: 103 mg/dL — ABNORMAL HIGH (ref 65–99)
Glucose-Capillary: 95 mg/dL (ref 65–99)

## 2017-06-20 MED ORDER — CARVEDILOL 6.25 MG PO TABS
6.2500 mg | ORAL_TABLET | Freq: Two times a day (BID) | ORAL | Status: DC
  Administered 2017-06-20 – 2017-06-21 (×3): 6.25 mg via ORAL
  Filled 2017-06-20 (×3): qty 1

## 2017-06-20 NOTE — Progress Notes (Signed)
Pt reports feeling like he is going to pass out and overwhelming urge to urinate. Bladder scan shows 596cc. On call Urology PA paged. Received instructions to advance and pull back on suprapubic catheter and flush with 60cc. After flush, pale yellow urine was returned. Approximately 800cc of urine drained from bladder. Post void bladder scan 0cc. Some bloody drainage noted around suprapubic catheter, cleaned and dressing reapplied. Will continue to monitor.  Versie Starks, RN

## 2017-06-20 NOTE — Progress Notes (Signed)
Patient setting in chair and had 7 bts V-tach. Patient voiced no complaints

## 2017-06-20 NOTE — Progress Notes (Signed)
Progress Note  Patient Name: [name] Date of Encounter: 06/20/2017  Primary Cardiologist:   Peter Swaziland, MD   Subjective   No pain.  Mild swelling.   Inpatient Medications    Scheduled Meds: . aspirin EC  325 mg Oral Daily   Or  . aspirin  324 mg Per Tube Daily  . bisacodyl  10 mg Oral Daily   Or  . bisacodyl  10 mg Rectal Daily  . carvedilol  3.125 mg Oral BID WC  . Chlorhexidine Gluconate Cloth  6 each Topical Daily  . docusate sodium  200 mg Oral Daily  . ferrous fumarate-b12-vitamic C-folic acid  1 capsule Oral BID  . furosemide  40 mg Oral Daily  . gabapentin  300 mg Oral QHS  . lisinopril  2.5 mg Oral Daily  . mouth rinse  15 mL Mouth Rinse BID  . pantoprazole  40 mg Oral Daily  . potassium chloride  20 mEq Oral Daily  . sodium chloride flush  10-40 mL Intracatheter Q12H   Continuous Infusions: . sodium chloride     PRN Meds: fentaNYL (SUBLIMAZE) injection, ondansetron (ZOFRAN) IV, oxyCODONE, sodium chloride, sodium chloride flush, sodium chloride flush, traMADol, zolpidem   Vital Signs    Vitals:   06/19/17 1727 06/19/17 1920 06/20/17 0002 06/20/17 0452  BP: 108/66 119/67 115/73 120/73  Pulse: 88 89 80 93  Resp:  20 12 (!) 21  Temp:  98.2 F (36.8 C) 98.7 F (37.1 C) 97.9 F (36.6 C)  TempSrc:  Oral Oral Oral  SpO2:   100% 96%  Weight:    229 lb 14.4 oz (104.3 kg)  Height:        Intake/Output Summary (Last 24 hours) at 06/20/2017 0738 Last data filed at 06/20/2017 0500 Gross per 24 hour  Intake 720 ml  Output 1250 ml  Net -530 ml   Filed Weights   06/18/17 0500 06/19/17 0432 06/20/17 0452  Weight: 226 lb 10.1 oz (102.8 kg) 229 lb 6.4 oz (104.1 kg) 229 lb 14.4 oz (104.3 kg)    Telemetry    NSR, brief run of NSVT - Personally Reviewed      NA - Personally Reviewed  Physical Exam   GEN: No acute distress.   Neck: No  JVD Cardiac: RRR, no murmurs, rubs, or gallops.  Respiratory:   Few basilar left greater than right  crackles GI: Soft, nontender, non-distended  MS: No edema; No deformity. Neuro:  Nonfocal  Psych: Normal affect   Labs    Chemistry Recent Labs  Lab 06/16/17 0538 06/17/17 0443 06/18/17 0604 06/19/17 0553  NA 135 136 136 137  K 4.9 4.5 4.1 3.7  CL 105 101 100* 100*  CO2 25 28 29 30   GLUCOSE 144* 114* 106* 123*  BUN 24* 21* 24* 25*  CREATININE 1.50* 1.18 1.05 1.02  CALCIUM 8.4* 8.3* 8.5* 8.7*  PROT 5.5*  --   --   --   ALBUMIN 3.3*  --   --   --   AST 72*  --   --   --   ALT 75*  --   --   --   ALKPHOS 24*  --   --   --   BILITOT 3.8*  --   --   --   GFRNONAA 48* >60 >60 >60  GFRAA 55* >60 >60 >60  ANIONGAP 5 7 7 7      Hematology Recent Labs  Lab 06/17/17 0443 06/18/17 3338  06/19/17 0553  WBC 7.1 6.8 5.8  RBC 2.57* 2.72* 2.67*  HGB 8.0* 8.4* 8.2*  HCT 24.9* 26.3* 26.0*  MCV 96.9 96.7 97.4  MCH 31.1 30.9 30.7  MCHC 32.1 31.9 31.5  RDW 14.3 14.5 14.5  PLT 115* 134* 167    Cardiac EnzymesNo results for input(s): TROPONINI in the last 168 hours. No results for input(s): TROPIPOC in the last 168 hours.   BNPNo results for input(s): BNP, PROBNP in the last 168 hours.   DDimer No results for input(s): DDIMER in the last 168 hours.   Radiology    No results found.  Cardiac Studies   Cardiac cath  Conclusion     Prox RCA lesion is 70% stenosed.  Mid RCA lesion is 50% stenosed.  Previously placed Mid RCA to Dist RCA stent (unknown type) is widely patent.  Previously placed Dist RCA stent (unknown type) is widely patent.  Post Atrio lesion is 50% stenosed.  Ost LAD to Prox LAD lesion is 99% stenosed.  Ost 1st Diag to 1st Diag lesion is 95% stenosed.  Ost Ramus to Ramus lesion is 80% stenosed.  Ost 1st Mrg lesion is 75% stenosed.  There is severe left ventricular systolic dysfunction.  LV end diastolic pressure is normal.  The left ventricular ejection fraction is less than 25% by visual estimate.  LV end diastolic pressure is normal.    1. Severe 3 vessel obstructive CAD    -99% ostial LAD with TIMI 1 flow. The vessel fills antegrade to the second diagonal. There are good right to left collaterals to the mid to distal LAD. The vessel appears large.    - 95% proximal first diagonal    - 80% proximal ramus intermediate    - 75% large OM1    - 70% proximal RCA 2. Severe LV dysfunction. EF estimated at 20%. 3. Low LVEDP and PCWP c/w effective diuresis 4. Normal right heart pressures 5. Normal cardiac output. Index 3.28.     Patient Profile     62 y.o. male with CAD s/p multiple PCIs, familial hyperlipidemia on Repatha, and chronic systolic and diastolic heart failure here with NSTEMI and hypertensive urgency.Troponin 0.28. Cath on 06/08/17 showed multivessel CAD. EF 20-25%. Now post CABG.    Assessment & Plan    CAD/CABG:  Discharge in AM.    DYSLIPIDEMIA:   Patient to resume home Repatha  HTN:  This will be managed in the contest of treating his HF.  ACUTE ON CHRONIC SYSTOLIC HF:  Continue with Coreg that is his new med.  With NSVT tachycardia I would titrate to 6.25 mg bid today.  I would suggest sending home on Altace 2.5 mg since that is what he has at home.    CKD II:  Creat is stable.    CARDIOLOGY RECOMMENDATIONS:  Discharge is anticipated in the next 48 hours. Recommendations for medications and follow up:  Discharge Medications: Continue medications as they are currently listed in the [facility]. Exceptions to the above:  See above  Follow Up: The patient's Primary Cardiologist is Peter Swaziland, MD   Follow up in the office in 7 days  (I sent a message to Dr. Swaziland and staff to schedule.)   Signed,  Rollene Rotunda, MD  7:42 AM 06/20/2017  [facility]  For questions or updates, please contact [facility] Please consult [email or web address] for contact info under Cardiology/STEMI.   Signed, Rollene Rotunda, MD  06/20/2017, 7:38 AM

## 2017-06-20 NOTE — Progress Notes (Addendum)
[address]       [place] [address]             [phone]      6 Days Post-Op Procedure(s) (LRB): CORONARY ARTERY BYPASS GRAFTING (CABG) x four, using left internal mammary artery and right leg greater saphenous vein harvested endoscopically (N/A) TRANSESOPHAGEAL ECHOCARDIOGRAM (TEE) (N/A) CYSTOSCOPY (N/A) INSERTION OF SUPRAPUBIC CATHETER - Lower abdomen (N/A) Subjective: Feels okay this morning. He has already walked once this morning.   Objective: Vital signs in last 24 hours: Temp:  [97.6 F (36.4 C)-98.7 F (37.1 C)] 97.9 F (36.6 C) (06/16 0452) Pulse Rate:  [80-93] 93 (06/16 0452) Cardiac Rhythm: Ventricular tachycardia (06/16 0516) Resp:  [12-25] 21 (06/16 0452) BP: (95-132)/(60-80) 120/73 (06/16 0452) SpO2:  [96 %-100 %] 96 % (06/16 0452) Weight:  [104.3 kg (229 lb 14.4 oz)] 104.3 kg (229 lb 14.4 oz) (06/16 0452)     Intake/Output from previous day: 06/15 0701 - 06/16 0700 In: 720 [P.O.:720] Out: 1250 [Urine:1250] Intake/Output this shift: No intake/output data recorded.  General appearance: alert, cooperative and no distress Heart: regular rate and rhythm, S1, S2 normal, no murmur, click, rub or gallop Lungs: clear to auscultation bilaterally Abdomen: soft, non-tender; bowel sounds normal; no masses,  no organomegaly Extremities: 1+ pitting pedal edema in the lower extremity Wound: clean and dry  Lab Results: Recent Labs    06/18/17 0604 06/19/17 0553  WBC 6.8 5.8  HGB 8.4* 8.2*  HCT 26.3* 26.0*  PLT 134* 167   BMET:  Recent Labs    06/18/17 0604 06/19/17 0553  NA 136 137  K 4.1 3.7  CL 100* 100*  CO2 29 30  GLUCOSE 106* 123*  BUN 24* 25*  CREATININE 1.05 1.02  CALCIUM 8.5* 8.7*    PT/INR: No results for input(s): LABPROT, INR in the last 72 hours. ABG    Component Value Date/Time   PHART 7.355 06/15/2017 0855   HCO3 22.1 06/15/2017 0855   TCO2 24 06/15/2017 1501   ACIDBASEDEF 3.0 (H) 06/15/2017 0855   O2SAT 81.4 06/17/2017 0645   CBG (last 3)  Recent Labs    06/19/17 1600 06/19/17 2102 06/20/17 0558  GLUCAP 108* 86 103*    Assessment/Plan: S/P Procedure(s) (LRB): CORONARY ARTERY BYPASS GRAFTING (CABG) x four, using left internal mammary artery and right leg greater saphenous vein harvested endoscopically (N/A) TRANSESOPHAGEAL ECHOCARDIOGRAM (TEE) (N/A) CYSTOSCOPY (N/A) INSERTION OF SUPRAPUBIC CATHETER - Lower abdomen (N/A)  1. CV-7 beats of v-tach noted. Asymptomatic. Continue coreg, low-dose lisinopril.  HR has been in the 90s NSR. BP is stable. 2. Pulm-tolerating room air with good oxygen saturation. CXR- No pneumothorax and small pleural effusions.Continue to encourage incentive spirometer.  3. Renal-creatinine down to 1.02, electrolytes okay. Weight is up 9lbs with expected fluid overload.He remains on Lasix 40mg  daily PO.Continue suprapubic catheter until he follows up with urology outpatient.  4. H and H is 8.2/26.0this morning. Trending up. 5. Platelets trending up 6. Endo-blood glucose level well controlled on insulin. 7. Insomnia-was on Ambien at home. Will restart. Educated the patient on taking his pain medication several hours before the Ambien.   Plan: Continue diuretics for fluid overload. Monitor rhythm closely. Ambulate in the halls. Encourage incentive spirometer use. Still plan to d/c Monday as long as he continues to progress.    LOS: 12 days    Sharlene Dory 06/20/2017 Patient seen and examined, agree with above Should be ready for dc tomorrow  Santiel Topper C. Kweku Stankey, MD [facility] [phone]  

## 2017-06-21 MED ORDER — ASPIRIN 325 MG PO TBEC: 325.0000 mg | DELAYED_RELEASE_TABLET | Freq: Every day | ORAL | 0 refills | Status: DC

## 2017-06-21 MED ORDER — FUROSEMIDE 40 MG PO TABS: 40.0000 mg | ORAL_TABLET | Freq: Every day | ORAL | 0 refills | Status: DC

## 2017-06-21 MED ORDER — POTASSIUM CHLORIDE CRYS ER 20 MEQ PO TBCR: 20.0000 meq | EXTENDED_RELEASE_TABLET | Freq: Every day | ORAL | 0 refills | Status: DC

## 2017-06-21 MED ORDER — LISINOPRIL 2.5 MG PO TABS: 2.5000 mg | ORAL_TABLET | Freq: Every day | ORAL | 1 refills | Status: DC

## 2017-06-21 MED ORDER — CARVEDILOL 6.25 MG PO TABS: 6.2500 mg | ORAL_TABLET | Freq: Two times a day (BID) | ORAL | 1 refills | Status: DC

## 2017-06-21 MED ORDER — OXYCODONE HCL 5 MG PO TABS: 5.0000 mg | ORAL_TABLET | ORAL | 0 refills | Status: DC | PRN

## 2017-06-21 NOTE — Progress Notes (Addendum)
CARDIOLOGY RECOMMENDATIONS:  Discharge is anticipated in the next 48 hours. Recommendations for medications and follow up:  Discharge Medications: Continue medications as they are currently listed in the [facility]. Exceptions to the above:  None  Follow Up: The patient's Primary Cardiologist is Peter Swaziland, MD   Follow up in the office as arranged.  Melida Quitter, PA-C  9:28 AM 06/21/2017  [facility]

## 2017-06-21 NOTE — Progress Notes (Signed)
Spoke with triage nurse Vernona Rieger at [facility] and arranged supra pubic catheter follow up appointment for tomorrow 06/22/17 @ 10:30am.

## 2017-06-21 NOTE — Progress Notes (Signed)
[name] to be D/C'd Home per MD order. Discussed with the patient and all questions fully answered.    IV catheter discontinued intact. Site without signs and symptoms of complications. Dressing and pressure applied.  An After Visit Summary was printed and given to the patient.  Patient escorted via WC, and D/C home via private auto.  Kai Levins  06/21/2017 4:06 PM

## 2017-06-21 NOTE — Progress Notes (Signed)
[phone] Pt stated he had walked twice this morning and had sat up for hours and just got back to bed. Did not walk with pt. Education completed with pt and wife who voiced understanding. Stressed importance of sternal precautions and staying in the tube. Gave handout. Reviewed IS, ex ed , signs of CHF, importance of daily weights, low sodium diet and 2L FR. Pt has heart healthy and low sodium diets. Discussed CRP2 and referred to [facility] program.  Luetta Nutting RN BSN 06/21/2017 9:52 AM

## 2017-06-21 NOTE — Progress Notes (Signed)
      [address]       [place] [address]             [phone]      7 Days Post-Op Procedure(s) (LRB): CORONARY ARTERY BYPASS GRAFTING (CABG) x four, using left internal mammary artery and right leg greater saphenous vein harvested endoscopically (N/A) TRANSESOPHAGEAL ECHOCARDIOGRAM (TEE) (N/A) CYSTOSCOPY (N/A) INSERTION OF SUPRAPUBIC CATHETER - Lower abdomen (N/A) Subjective: Suprapubic cath issues noted.  Objective: Vital signs in last 24 hours: Temp:  [97.7 F (36.5 C)-98.3 F (36.8 C)] 98 F (36.7 C) (06/17 0743) Pulse Rate:  [76-94] 85 (06/17 0743) Cardiac Rhythm: Sinus tachycardia;Bundle branch block (06/17 0045) Resp:  [13-22] 18 (06/17 0743) BP: (103-130)/(63-74) 130/74 (06/17 0743) SpO2:  [94 %-100 %] 99 % (06/17 0743) Weight:  [103.8 kg (228 lb 14.4 oz)] 103.8 kg (228 lb 14.4 oz) (06/17 0400)     Intake/Output from previous day: 06/16 0701 - 06/17 0700 In: 960 [P.O.:960] Out: 2550 [Urine:2550] Intake/Output this shift: No intake/output data recorded.  General appearance: alert, cooperative and no distress Heart: regular rate and rhythm, S1, S2 normal, no murmur, click, rub or gallop Lungs: clear to auscultation bilaterally Abdomen: soft, non-tender; bowel sounds normal; no masses,  no organomegaly Extremities: extremities normal, atraumatic, no cyanosis or edema Wound: clean and dry  Lab Results: Recent Labs    06/19/17 0553  WBC 5.8  HGB 8.2*  HCT 26.0*  PLT 167   BMET:  Recent Labs    06/19/17 0553  NA 137  K 3.7  CL 100*  CO2 30  GLUCOSE 123*  BUN 25*  CREATININE 1.02  CALCIUM 8.7*    PT/INR: No results for input(s): LABPROT, INR in the last 72 hours. ABG    Component Value Date/Time   PHART 7.355 06/15/2017 0855   HCO3 22.1 06/15/2017 0855   TCO2 24 06/15/2017 1501   ACIDBASEDEF 3.0 (H) 06/15/2017 0855   O2SAT 81.4 06/17/2017 0645   CBG (last 3)  Recent Labs    06/19/17 2102 06/20/17 0558  06/20/17 0749  GLUCAP 86 103* 95    Assessment/Plan: S/P Procedure(s) (LRB): CORONARY ARTERY BYPASS GRAFTING (CABG) x four, using left internal mammary artery and right leg greater saphenous vein harvested endoscopically (N/A) TRANSESOPHAGEAL ECHOCARDIOGRAM (TEE) (N/A) CYSTOSCOPY (N/A) INSERTION OF SUPRAPUBIC CATHETER - Lower abdomen (N/A)  1. CV- Continue coreg, low-dose lisinopril.HR has been in the 90s NSR. BP is stable. 2. Pulm-tolerating room air with good oxygen saturation. CXR- No pneumothoraxand small pleural effusions.Continue to encourage incentive spirometer.  3. Renal-creatinine down to 1.02, electrolytes okay. Weight is up9lbs with expected fluid overload.He remains on Lasix 40mg  daily PO.Continue suprapubic catheter until he follows up with urology outpatient. Retention noted and Urology paged 4. H and H is 8.2/26.0this morning. Trending up. 5. Platelets trending up 6. Endo-blood glucose level well controlled on insulin. 7. Insomnia-was on Ambien at home. Will restart. Educated the patient on taking his pain medication several hours before the Ambien.   Plan: It sounds like after the suprapubic cath was flushed it is now functioning properly. Await urology to visit and set an appointment for him in their office. Likely d/c this afternoon.      LOS: 13 days    Sharlene Dory 06/21/2017

## 2017-06-21 NOTE — Progress Notes (Signed)
Physical Therapy Treatment Patient Details Name: [name] MRN: [id number] DOB: October 29, 1955 Today's Date: 06/21/2017    History of Present Illness 62 y.o. male s/p CABG x4 06/14/17. PMH includes: MI, HTN, Gout, CHF, lumbar fusion, cervical fusion.     PT Comments    Pt mobilizing well and ready for dc from PT standpoint.   Follow Up Recommendations  No PT follow up     Equipment Recommendations  None recommended by PT    Recommendations for Other Services       Precautions / Restrictions Precautions Precautions: Sternal Restrictions Weight Bearing Restrictions: No RUE Weight Bearing: (sternal precaut) LUE Weight Bearing: (sternal precaut)    Mobility  Bed Mobility               General bed mobility comments: Pt up in hall but reports independent with bed mobility  Transfers Overall transfer level: Modified independent Equipment used: None Transfers: Sit to/from Stand Sit to Stand: Modified independent (Device/Increase time)            Ambulation/Gait Ambulation/Gait assistance: Modified independent (Device/Increase time) Gait Distance (Feet): 500 Feet Assistive device: None;Rolling walker (2 wheeled) Gait Pattern/deviations: Step-through pattern;Decreased stride length Gait velocity: slight decrease Gait velocity interpretation: >2.62 ft/sec, indicative of community ambulatory General Gait Details: Steady gait with or without walker   Stairs Stairs: Yes Stairs assistance: Supervision Stair Management: One rail Right;Step to pattern;Forwards Number of Stairs: 12     Wheelchair Mobility    Modified Rankin (Stroke Patients Only)       Balance Overall balance assessment: No apparent balance deficits (not formally assessed)                                          Cognition Arousal/Alertness: Awake/alert Behavior During Therapy: WFL for tasks assessed/performed Overall Cognitive Status: Within Functional Limits for  tasks assessed                                        Exercises      General Comments        Pertinent Vitals/Pain Pain Assessment: No/denies pain    Home Living                      Prior Function            PT Goals (current goals can now be found in the care plan section) Progress towards PT goals: Goals met/education completed, patient discharged from PT    Frequency           PT Plan Other (comment)(DC from PT)    Co-evaluation              AM-PAC PT "6 Clicks" Daily Activity  Outcome Measure  Difficulty turning over in bed (including adjusting bedclothes, sheets and blankets)?: None Difficulty moving from lying on back to sitting on the side of the bed? : None Difficulty sitting down on and standing up from a chair with arms (e.g., wheelchair, bedside commode, etc,.)?: None Help needed moving to and from a bed to chair (including a wheelchair)?: None Help needed walking in hospital room?: None Help needed climbing 3-5 steps with a railing? : A Little 6 Click Score: 23    End of Session Equipment Utilized During Treatment:  Gait belt Activity Tolerance: Patient tolerated treatment well Patient left: with family/visitor present(standing in room)   PT Visit Diagnosis: Difficulty in walking, not elsewhere classified (R26.2)     Time: 2863-8177 PT Time Calculation (min) (ACUTE ONLY): 8 min  Charges:  $Gait Training: 8-22 mins                    G Codes:       [facility] PT [place] 06/21/2017, 12:21 PM

## 2017-06-22 ENCOUNTER — Inpatient Hospital Stay (HOSPITAL_COMMUNITY): Payer: BLUE CROSS/BLUE SHIELD

## 2017-06-22 ENCOUNTER — Emergency Department (HOSPITAL_COMMUNITY): Payer: BLUE CROSS/BLUE SHIELD

## 2017-06-22 ENCOUNTER — Inpatient Hospital Stay (HOSPITAL_COMMUNITY)
Admission: EM | Admit: 2017-06-22 | Discharge: 2017-06-25 | Disposition: A | Discharge status: Home / Self Care | Payer: BLUE CROSS/BLUE SHIELD | Source: Home / Self Care | Attending: Cardiology | Admitting: Cardiology

## 2017-06-22 ENCOUNTER — Encounter (HOSPITAL_COMMUNITY): Payer: Self-pay

## 2017-06-22 ENCOUNTER — Other Ambulatory Visit: Payer: Self-pay

## 2017-06-22 DIAGNOSIS — R9431 Abnormal electrocardiogram [ECG] [EKG]: Secondary | ICD-10-CM

## 2017-06-22 DIAGNOSIS — N35112 Postinfective bulbous urethral stricture, not elsewhere classified: Secondary | ICD-10-CM | POA: Diagnosis not present

## 2017-06-22 DIAGNOSIS — I472 Ventricular tachycardia: Secondary | ICD-10-CM

## 2017-06-22 DIAGNOSIS — I509 Heart failure, unspecified: Secondary | ICD-10-CM | POA: Diagnosis not present

## 2017-06-22 DIAGNOSIS — R55 Syncope and collapse: Secondary | ICD-10-CM | POA: Diagnosis not present

## 2017-06-22 DIAGNOSIS — I5023 Acute on chronic systolic (congestive) heart failure: Secondary | ICD-10-CM

## 2017-06-22 DIAGNOSIS — I110 Hypertensive heart disease with heart failure: Secondary | ICD-10-CM | POA: Diagnosis not present

## 2017-06-22 DIAGNOSIS — J90 Pleural effusion, not elsewhere classified: Secondary | ICD-10-CM | POA: Diagnosis not present

## 2017-06-22 DIAGNOSIS — I5022 Chronic systolic (congestive) heart failure: Secondary | ICD-10-CM | POA: Diagnosis present

## 2017-06-22 LAB — COMPREHENSIVE METABOLIC PANEL
ALT: 40 U/L (ref 17–63)
AST: 24 U/L (ref 15–41)
Albumin: 2.7 g/dL — ABNORMAL LOW (ref 3.5–5.0)
Alkaline Phosphatase: 61 U/L (ref 38–126)
Anion gap: 8 (ref 5–15)
BUN: 24 mg/dL — ABNORMAL HIGH (ref 6–20)
CO2: 24 mmol/L (ref 22–32)
Calcium: 8.5 mg/dL — ABNORMAL LOW (ref 8.9–10.3)
Chloride: 104 mmol/L (ref 101–111)
Creatinine, Ser: 1.04 mg/dL (ref 0.61–1.24)
GFR calc Af Amer: >60 mL/min (ref >60)
GFR calc non Af Amer: >60 mL/min (ref >60)
Glucose, Bld: 114 mg/dL — ABNORMAL HIGH (ref 65–99)
Potassium: 4.3 mmol/L (ref 3.5–5.1)
Sodium: 136 mmol/L (ref 135–145)
Total Bilirubin: 1.6 mg/dL — ABNORMAL HIGH (ref 0.3–1.2)
Total Protein: 5.2 g/dL — ABNORMAL LOW (ref 6.5–8.1)

## 2017-06-22 LAB — I-STAT CHEM 8, ED
BUN: 23 mg/dL — ABNORMAL HIGH (ref 6–20)
Calcium, Ion: 1.09 mmol/L — ABNORMAL LOW (ref 1.15–1.40)
Chloride: 100 mmol/L — ABNORMAL LOW (ref 101–111)
Creatinine, Ser: 1 mg/dL (ref 0.61–1.24)
Glucose, Bld: 110 mg/dL — ABNORMAL HIGH (ref 65–99)
HCT: 24 % — ABNORMAL LOW (ref 39.0–52.0)
Hemoglobin: 8.2 g/dL — ABNORMAL LOW (ref 13.0–17.0)
Potassium: 4 mmol/L (ref 3.5–5.1)
Sodium: 134 mmol/L — ABNORMAL LOW (ref 135–145)
TCO2: 22 mmol/L (ref 22–32)

## 2017-06-22 LAB — CBC WITH DIFFERENTIAL/PLATELET
Abs Immature Granulocytes: 0.3 10*3/uL — ABNORMAL HIGH (ref 0.0–0.1)
Basophils Absolute: 0 10*3/uL (ref 0.0–0.1)
Basophils Relative: 0 %
Eosinophils Absolute: 0.2 10*3/uL (ref 0.0–0.7)
Eosinophils Relative: 2 %
HCT: 28 % — ABNORMAL LOW (ref 39.0–52.0)
Hemoglobin: 8.5 g/dL — ABNORMAL LOW (ref 13.0–17.0)
Immature Granulocytes: 3 %
Lymphocytes Relative: 11 %
Lymphs Abs: 0.9 10*3/uL (ref 0.7–4.0)
MCH: 30.5 pg (ref 26.0–34.0)
MCHC: 30.4 g/dL (ref 30.0–36.0)
MCV: 100.4 fL — ABNORMAL HIGH (ref 78.0–100.0)
Monocytes Absolute: 0.7 10*3/uL (ref 0.1–1.0)
Monocytes Relative: 9 %
Neutro Abs: 5.8 10*3/uL (ref 1.7–7.7)
Neutrophils Relative %: 75 %
Platelets: 312 10*3/uL (ref 150–400)
RBC: 2.79 MIL/uL — ABNORMAL LOW (ref 4.22–5.81)
RDW: 15.2 % (ref 11.5–15.5)
WBC: 7.7 10*3/uL (ref 4.0–10.5)

## 2017-06-22 LAB — I-STAT TROPONIN, ED: Troponin i, poc: 0.06 ng/mL (ref 0.00–0.08)

## 2017-06-22 LAB — CBG MONITORING, ED: Glucose-Capillary: 110 mg/dL — ABNORMAL HIGH (ref 65–99)

## 2017-06-22 MED ORDER — ACETAMINOPHEN 325 MG PO TABS
650.0000 mg | ORAL_TABLET | ORAL | Status: DC | PRN
  Administered 2017-06-23 – 2017-06-25 (×2): 650 mg via ORAL
  Filled 2017-06-22 (×2): qty 2

## 2017-06-22 MED ORDER — ZOLPIDEM TARTRATE 5 MG PO TABS
10.0000 mg | ORAL_TABLET | Freq: Every day | ORAL | Status: DC
  Administered 2017-06-22 – 2017-06-24 (×3): 10 mg via ORAL
  Filled 2017-06-22 (×3): qty 2

## 2017-06-22 MED ORDER — SODIUM CHLORIDE 0.9% FLUSH
3.0000 mL | Freq: Two times a day (BID) | INTRAVENOUS | Status: DC
  Administered 2017-06-23 – 2017-06-25 (×5): 3 mL via INTRAVENOUS

## 2017-06-22 MED ORDER — ONDANSETRON HCL 4 MG/2ML IJ SOLN: 4.0000 mg | Freq: Four times a day (QID) | INTRAMUSCULAR | Status: DC | PRN

## 2017-06-22 MED ORDER — POTASSIUM CHLORIDE CRYS ER 20 MEQ PO TBCR
20.0000 meq | EXTENDED_RELEASE_TABLET | Freq: Every day | ORAL | Status: DC
  Administered 2017-06-23 – 2017-06-25 (×3): 20 meq via ORAL
  Filled 2017-06-22 (×3): qty 1

## 2017-06-22 MED ORDER — GABAPENTIN 300 MG PO CAPS
300.0000 mg | ORAL_CAPSULE | Freq: Every day | ORAL | Status: DC
  Administered 2017-06-22 – 2017-06-24 (×3): 300 mg via ORAL
  Filled 2017-06-22 (×3): qty 1

## 2017-06-22 MED ORDER — IOPAMIDOL (ISOVUE-370) INJECTION 76%
INTRAVENOUS | Status: AC
  Administered 2017-06-22: 100 mL
  Filled 2017-06-22: qty 100

## 2017-06-22 MED ORDER — SODIUM CHLORIDE 0.9% FLUSH: 3.0000 mL | INTRAVENOUS | Status: DC | PRN

## 2017-06-22 MED ORDER — SODIUM CHLORIDE 0.9 % IV SOLN: 250.0000 mL | INTRAVENOUS | Status: DC | PRN

## 2017-06-22 MED ORDER — MAGNESIUM HYDROXIDE 400 MG/5ML PO SUSP
30.0000 mL | Freq: Once | ORAL | Status: AC
  Administered 2017-06-22: 30 mL via ORAL
  Filled 2017-06-22: qty 30

## 2017-06-22 MED ORDER — FUROSEMIDE 10 MG/ML IJ SOLN
INTRAMUSCULAR | Status: AC
  Administered 2017-06-22: 80 mg via INTRAVENOUS
  Filled 2017-06-22: qty 8

## 2017-06-22 MED ORDER — LOSARTAN POTASSIUM 25 MG PO TABS
25.0000 mg | ORAL_TABLET | Freq: Every day | ORAL | Status: DC
  Administered 2017-06-22 – 2017-06-23 (×2): 25 mg via ORAL
  Filled 2017-06-22 (×2): qty 1

## 2017-06-22 MED ORDER — FENOFIBRATE 160 MG PO TABS
160.0000 mg | ORAL_TABLET | Freq: Every day | ORAL | Status: DC
  Administered 2017-06-23 – 2017-06-25 (×3): 160 mg via ORAL
  Filled 2017-06-22 (×3): qty 1

## 2017-06-22 MED ORDER — CARVEDILOL 6.25 MG PO TABS
6.2500 mg | ORAL_TABLET | Freq: Two times a day (BID) | ORAL | Status: DC
  Administered 2017-06-23 – 2017-06-25 (×5): 6.25 mg via ORAL
  Filled 2017-06-22 (×5): qty 1

## 2017-06-22 MED ORDER — ENOXAPARIN SODIUM 40 MG/0.4ML ~~LOC~~ SOLN
40.0000 mg | SUBCUTANEOUS | Status: DC
  Administered 2017-06-22 – 2017-06-24 (×3): 40 mg via SUBCUTANEOUS
  Filled 2017-06-22 (×2): qty 0.4

## 2017-06-22 MED ORDER — FUROSEMIDE 10 MG/ML IJ SOLN
80.0000 mg | Freq: Two times a day (BID) | INTRAMUSCULAR | Status: DC
  Administered 2017-06-22 – 2017-06-24 (×4): 80 mg via INTRAVENOUS
  Filled 2017-06-22 (×4): qty 8

## 2017-06-22 MED ORDER — ASPIRIN EC 325 MG PO TBEC
325.0000 mg | DELAYED_RELEASE_TABLET | Freq: Every day | ORAL | Status: DC
  Administered 2017-06-23 – 2017-06-25 (×3): 325 mg via ORAL
  Filled 2017-06-22 (×3): qty 1

## 2017-06-22 MED ORDER — CARVEDILOL 6.25 MG PO TABS
6.2500 mg | ORAL_TABLET | Freq: Two times a day (BID) | ORAL | Status: DC
  Administered 2017-06-22: 6.25 mg via ORAL
  Filled 2017-06-22: qty 1

## 2017-06-22 MED ORDER — OXYCODONE HCL 5 MG PO TABS
5.0000 mg | ORAL_TABLET | ORAL | Status: DC | PRN
  Administered 2017-06-22 – 2017-06-25 (×12): 5 mg via ORAL
  Filled 2017-06-22 (×13): qty 1

## 2017-06-22 NOTE — ED Provider Notes (Signed)
[facility] EMERGENCY DEPARTMENT Provider Note   CSN: [id number] Arrival date & time: 06/22/17  1205     History   Chief Complaint Chief Complaint  Patient presents with  . Loss of Consciousness    HPI [name] is a 62 y.o. male.  The history is provided by the patient and the EMS personnel. No language interpreter was used.   [name] is a 62 y.o. male who presents to the Emergency Department complaining of near syncope. He is status post coronary artery bypass surgery two weeks ago. Yesterday he was discharged from the hospital and feeling well at that time, able to ambulate without significant difficulty. Today he went to the urology follow-up appointment due to difficulty with draining fluid from his suprapubic catheter. Prior to being treated in the office he began to feel like he was about to pass out and felt like his blood pressure was dropping and he fell to the floor. He denies any loss of consciousness. He did have significant chest pain and shortness of breath as well as pain in his right leg. Overall he feels significantly improved but he does have some mild discomfort in his right foot. No fevers, vomiting. He has had intermittent difficulty with draining urine from his catheter. He does endorse bilateral lower extremity edema, this is slightly improved compared to earlier. Past Medical History:  Diagnosis Date  . Arthritis    "mild; back, neck, spine" (06/09/2017)  . CHF (congestive heart failure) (HCC)   . Coronary artery disease   . History of gout   . History of kidney stones X 2  . Hypercholesterolemia   . Hypertension   . LBBB (left bundle branch block)   . MI (myocardial infarction) (HCC) 1993   LATERAL  . MI (myocardial infarction) (HCC) ?09/2001; 06/07/2017  . Pneumonia    "several times" (06/09/2017)  . S/P coronary artery stent placement    RCA  . Varicose veins     Patient Active Problem List   Diagnosis Date Noted  . Acute  on chronic systolic (congestive) heart failure (HCC) 06/22/2017  . S/P CABG x 4 06/14/2017  . Acute pulmonary edema (HCC)   . Acute on chronic combined systolic and diastolic CHF (congestive heart failure) (HCC)   . ACS (acute coronary syndrome) (HCC) 06/08/2017  . Pain in the chest   . Gout 08/30/2014  . Neck pain, chronic 12/17/2010  . Back pain, chronic 12/01/2010  . Pericarditis 10/02/2010  . CAD (coronary artery disease) 05/14/2010  . Hypertension   . Hypercholesterolemia   . LBBB (left bundle branch block)   . NSTEMI (non-ST elevated myocardial infarction) [facility])     Past Surgical History:  Procedure Laterality Date  . ANTERIOR CERVICAL DECOMP/DISCECTOMY FUSION  03/03/2011   Procedure: ANTERIOR CERVICAL DECOMPRESSION/DISCECTOMY FUSION 3 LEVELS;  Surgeon: Karn Cassis, MD;  Location: [facility] NEURO ORS;  Service: Neurosurgery;  Laterality: N/A;  Cervical three-four Cervical four-five Cervical five-six Anterior cervical decompression/diskectomy, fusion  . BACK SURGERY    . CARDIAC CATHETERIZATION  2009   Stents in RCA patent. 70 to 80% PL, and 50% ostial PD.   Marland Kitchen CARDIAC CATHETERIZATION N/A 11/20/2014   Procedure: Left Heart Cath and Coronary Angiography;  Surgeon: Peter M Swaziland, MD;  Location: [facility] INVASIVE CV LAB;  Service: Cardiovascular;  Laterality: N/A;  . CORONARY ANGIOPLASTY     DIRECT ANGIOPLASTY THE MARGINAL BRANCH  . CORONARY ANGIOPLASTY WITH STENT PLACEMENT     RCA  .  CORONARY ARTERY BYPASS GRAFT N/A 06/14/2017   Procedure: CORONARY ARTERY BYPASS GRAFTING (CABG) x four, using left internal mammary artery and right leg greater saphenous vein harvested endoscopically;  Surgeon: Kerin Perna, MD;  Location: [facility] OR;  Service: Open Heart Surgery;  Laterality: N/A;  . CYSTOSCOPY N/A 06/14/2017   Procedure: CYSTOSCOPY;  Surgeon: Donata Clay, Theron Arista, MD;  Location: [facility] OR;  Service: Open Heart Surgery;  Laterality: N/A;  . INSERTION OF SUPRAPUBIC CATHETER N/A 06/14/2017   Procedure:  INSERTION OF SUPRAPUBIC CATHETER - Lower abdomen;  Surgeon: Kerin Perna, MD;  Location: [facility] OR;  Service: Open Heart Surgery;  Laterality: N/A;  . POSTERIOR LUMBAR FUSION  10/2011  . RIGHT/LEFT HEART CATH AND CORONARY ANGIOGRAPHY N/A 06/08/2017   Procedure: RIGHT/LEFT HEART CATH AND CORONARY ANGIOGRAPHY;  Surgeon: Swaziland, Peter M, MD;  Location: [facility] INVASIVE CV LAB;  Service: Cardiovascular;  Laterality: N/A;  . TEE WITHOUT CARDIOVERSION N/A 06/14/2017   Procedure: TRANSESOPHAGEAL ECHOCARDIOGRAM (TEE);  Surgeon: Donata Clay, Theron Arista, MD;  Location: [facility] OR;  Service: Open Heart Surgery;  Laterality: N/A;        Home Medications    Prior to Admission medications   Medication Sig Start Date End Date Taking? Authorizing Provider  aspirin 325 MG EC tablet Take 1 tablet (325 mg total) by mouth daily. 06/22/17  Yes Asa Lente, Tessa N, PA-C  carvedilol (COREG) 6.25 MG tablet Take 1 tablet (6.25 mg total) by mouth 2 (two) times daily with a meal. 06/21/17  Yes Asa Lente, Tessa N, PA-C  fenofibrate (TRICOR) 145 MG tablet TAKE 1 TABLET BY MOUTH EVERY DAY 01/14/17  Yes Swaziland, Peter M, MD  furosemide (LASIX) 40 MG tablet Take 1 tablet (40 mg total) by mouth daily. 06/22/17  Yes Conte, Tessa N, PA-C  gabapentin (NEURONTIN) 300 MG capsule Take 300 mg by mouth at bedtime. 03/15/17  Yes [provider]  lisinopril (PRINIVIL,ZESTRIL) 2.5 MG tablet Take 1 tablet (2.5 mg total) by mouth daily. 06/22/17  Yes Conte, Tessa N, PA-C  Multiple Vitamins-Minerals (MENS MULTIVITAMIN PLUS PO) Take 1 tablet by mouth 2 (two) times daily.   Yes [provider]  oxyCODONE (OXY IR/ROXICODONE) 5 MG immediate release tablet Take 1 tablet (5 mg total) by mouth every 4 (four) hours as needed for severe pain. 06/21/17  Yes Conte, Tessa N, PA-C  potassium chloride SA (K-DUR,KLOR-CON) 20 MEQ tablet Take 1 tablet (20 mEq total) by mouth daily. 06/22/17  Yes Conte, Tessa N, PA-C  zolpidem (AMBIEN) 10 MG tablet Take 10 mg by mouth at  bedtime.   Yes [provider]    Family History Family History  Problem Relation Age of Onset  . Heart attack Father   . Diabetes Father   . Breast cancer Mother   . Coronary artery disease Mother   . Heart disease Brother   . Heart attack Maternal Grandfather     Social History Social History   Tobacco Use  . Smoking status: Former Smoker    Packs/day: 2.00    Years: 20.00    Pack years: 40.00    Types: Cigarettes    Last attempt to quit: 05/07/1999    Years since quitting: 18.1  . Smokeless tobacco: Never Used  Substance Use Topics  . Alcohol use: Yes    Comment: 06/09/2017 "1 beer/month; at most"  . Drug use: Never     Allergies   Ezetimibe; Niacin; and Statins   Review of Systems Review of Systems  All other systems  reviewed and are negative.    Physical Exam Updated Vital Signs BP (!) 104/55 (BP Location: Right Arm)   Pulse 80   Temp 98.7 F (37.1 C) (Oral)   Resp 18   Ht 6\' 2"  (1.88 m)   Wt 101.9 kg (224 lb 9.6 oz)   SpO2 92%   BMI 28.84 kg/m   Physical Exam  Constitutional: He is oriented to person, place, and time. He appears well-developed and well-nourished.  HENT:  Head: Normocephalic and atraumatic.  Cardiovascular: Normal rate and regular rhythm.  No murmur heard. Pulmonary/Chest: Effort normal. No respiratory distress.  Decreased air movement in bilateral bases.  Midline sternotomy incision site c/d/i.   Abdominal: Soft. There is no tenderness. There is no rebound and no guarding.  Genitourinary:  Genitourinary Comments: Suprapubic catheter in place with minimal local erythema.  No local tenderness.    Musculoskeletal: He exhibits no tenderness.  2+ pitting edema to BLE. Ecchymosis and tenderness to the right groin.  2+ femoral pulses bilaterally, faint DP pulses bilaterally.   Neurological: He is alert and oriented to person, place, and time.  Skin: Skin is warm and dry. There is pallor.  Psychiatric: He has a normal mood and  affect. His behavior is normal.  Nursing note and vitals reviewed.    ED Treatments / Results  Labs (all labs ordered are listed, but only abnormal results are displayed) Labs Reviewed  COMPREHENSIVE METABOLIC PANEL - Abnormal; Notable for the following components:      Result Value   Glucose, Bld 114 (*)    BUN 24 (*)    Calcium 8.5 (*)    Total Protein 5.2 (*)    Albumin 2.7 (*)    Total Bilirubin 1.6 (*)    All other components within normal limits  CBC WITH DIFFERENTIAL/PLATELET - Abnormal; Notable for the following components:   RBC 2.79 (*)    Hemoglobin 8.5 (*)    HCT 28.0 (*)    MCV 100.4 (*)    Abs Immature Granulocytes 0.3 (*)    All other components within normal limits  I-STAT CHEM 8, ED - Abnormal; Notable for the following components:   Sodium 134 (*)    Chloride 100 (*)    BUN 23 (*)    Glucose, Bld 110 (*)    Calcium, Ion 1.09 (*)    Hemoglobin 8.2 (*)    HCT 24.0 (*)    All other components within normal limits  CBG MONITORING, ED - Abnormal; Notable for the following components:   Glucose-Capillary 110 (*)    All other components within normal limits  BASIC METABOLIC PANEL  I-STAT TROPONIN, ED    EKG EKG Interpretation  Date/Time:  Tuesday June 22 2017 12:12:08 EDT Ventricular Rate:  70 PR Interval:    QRS Duration: 186 QT Interval:  468 QTC Calculation: 505 R Axis:   84 Text Interpretation:  Sinus rhythm Probable left atrial enlargement LBBB Confirmed by Tilden Fossa [phone]) on 06/22/2017 12:19:53 PM   Radiology Ct Angio Chest Pe W/cm &/or Wo Cm  Result Date: 06/22/2017 CLINICAL DATA:  Syncope today.  CABG 3 weeks ago. EXAM: CT ANGIOGRAPHY CHEST WITH CONTRAST TECHNIQUE: Multidetector CT imaging of the chest was performed using the standard protocol during bolus administration of intravenous contrast. Multiplanar CT image reconstructions and MIPs were obtained to evaluate the vascular anatomy. CONTRAST:  ISOVUE-370 IOPAMIDOL  (ISOVUE-370) INJECTION 76% COMPARISON:  06/07/2017 FINDINGS: Cardiovascular: Moderate cardiomegaly is present. There is calcified plaque over  the left main and 3 vessel coronary arteries. Mild calcified plaque over the thoracic aorta. No evidence of pulmonary embolism. Mediastinum/Nodes: Evidence patient's recent median sternotomy and CABG. Few small patchy mediastinal lymph nodes and fluid compatible with recent postop state. Tiny amount of air over the left anterior lower mediastinum. Lungs/Pleura: Lungs are adequately inflated demonstrate a small left-sided pleural effusion with associated atelectasis. Minimal posterior right basilar atelectasis. Resolution of previously seen interstitial edema. Airways are within normal. Upper Abdomen: No acute abnormality. Musculoskeletal: Mild degenerate change of the spine. Review of the MIP images confirms the above findings. IMPRESSION: No evidence of pulmonary embolism. Evidence of recent CABG with small left effusion and associated left basilar atelectasis. Minimal dependent right basilar atelectasis. Moderate cardiomegaly. Electronically Signed   By: Elberta Fortis M.D.   On: 06/22/2017 16:40   Dg Chest Port 1 View  Result Date: 06/22/2017 CLINICAL DATA:  Syncopal episode today. Status post CABG 8 days ago. EXAM: PORTABLE CHEST 1 VIEW COMPARISON:  PA and lateral chest x-ray of June 18, 2017 FINDINGS: The right lung is well-expanded and clear. On the left there is persistent basilar atelectasis and small effusion. There is no pneumothorax. The cardiac silhouette is mildly enlarged. The pulmonary vascularity is not clearly engorged. The sternal wires are intact. There is calcification in the wall of the aortic arch. The observed bony structures exhibit no acute abnormalities. IMPRESSION: Persistent left basilar atelectasis and small left pleural effusion. Clear right lung. No overt CHF. No pneumothorax. Electronically Signed   By: David  Swaziland M.D.   On: 06/22/2017  12:35    Procedures Procedures (including critical care time) CRITICAL CARE Performed by: Tilden Fossa   Total critical care time: 30 minutes  Critical care time was exclusive of separately billable procedures and treating other patients.  Critical care was necessary to treat or prevent imminent or life-threatening deterioration.  Critical care was time spent personally by me on the following activities: development of treatment plan with patient and/or surrogate as well as nursing, discussions with consultants, evaluation of patient's response to treatment, examination of patient, obtaining history from patient or surrogate, ordering and performing treatments and interventions, ordering and review of laboratory studies, ordering and review of radiographic studies, pulse oximetry and re-evaluation of patient's condition.  Medications Ordered in ED Medications  furosemide (LASIX) injection 80 mg (80 mg Intravenous Given 06/22/17 1523)  aspirin EC tablet 325 mg (325 mg Oral Not Given 06/22/17 1711)  fenofibrate tablet 160 mg (160 mg Oral Not Given 06/22/17 1712)  gabapentin (NEURONTIN) capsule 300 mg (300 mg Oral Given by Other 06/22/17 2036)  oxyCODONE (Oxy IR/ROXICODONE) immediate release tablet 5 mg (5 mg Oral Given by Other 06/22/17 2036)  potassium chloride SA (K-DUR,KLOR-CON) CR tablet 20 mEq (has no administration in time range)  zolpidem (AMBIEN) tablet 10 mg (10 mg Oral Given 06/22/17 2234)  sodium chloride flush (NS) 0.9 % injection 3 mL (has no administration in time range)  sodium chloride flush (NS) 0.9 % injection 3 mL (has no administration in time range)  0.9 %  sodium chloride infusion (has no administration in time range)  acetaminophen (TYLENOL) tablet 650 mg (has no administration in time range)  ondansetron (ZOFRAN) injection 4 mg (has no administration in time range)  enoxaparin (LOVENOX) injection 40 mg (40 mg Subcutaneous Given by Other 06/22/17 2037)  losartan  (COZAAR) tablet 25 mg (25 mg Oral Given 06/22/17 2036)  carvedilol (COREG) tablet 6.25 mg (has no administration in time range)  iopamidol (ISOVUE-370) 76 % injection (100 mLs  Contrast Given 06/22/17 1613)  magnesium hydroxide (MILK OF MAGNESIA) suspension 30 mL (30 mLs Oral Given 06/22/17 2234)     Initial Impression / Assessment and Plan / ED Course  I have reviewed the triage vital signs and the nursing notes.  Pertinent labs & imaging results that were available during my care of the patient were reviewed by me and considered in my medical decision making (see chart for details).     Patient here with chest pain, shortness of breath and syncopal event status post recent coronary artery bypass grafting. He was hypotensive on ED arrival, this did improve after IV fluid administration. Labs demonstrate stable anemia and renal function. No evidence of pneumonia. Cardiology consulted for stat echo to rule out tamponade, focal wall motion abnormality.   CT surgery made aware of patient returning to ED.  CTA obtained - negative for acute PE.  Plan to admit to Cardiology service for further evaluation and treatment.  Patient and family updated of findings of studies and recommendation for admission and they are in agreement with plan.    Final Clinical Impressions(s) / ED Diagnoses   Final diagnoses:  Syncope and collapse  Acute congestive heart failure, unspecified heart failure type [facility])    ED Discharge Orders    None       Tilden Fossa, MD 06/22/17 2341

## 2017-06-22 NOTE — H&P (Addendum)
Advanced Heart Failure Team History and Physical Note   PCP:  Jarome Matin, MD  PCP-Cardiology: Peter Swaziland, MD     Reason for Admission: Near Syncope/ A/C systolic CHF  HPI:    [name] is a 62 y.o. male with CAD s/p CABG x 4 06/14/17, HLD, HTN, chronic systolic CHF, CKD II, and urethral stricture s/p suprapubic catheterization.   Pt recently admitted from 6/3 - 06/21/17. Presented to [facility] with flash pulmonary edema. Ruled in for NSTEMI with peak trop I of 0.28. Taken for cath which showed severe 3vCAD as below. Echo with EF 20% TCTS saw in consultation and proceeded with CABG x 4 06/14/17. Post-op course was relatively uncomplicated apart from need for blood transfusion, urethral stricture s/p suprapubic catheterization, and IV lasix for volume overload. He was discharged 06/21/17 in good condition, with plans to follow up with urology 06/22/17.  He presented this am to his urology follow up due to difficulty draining fluid from his suprapubic catheter.  Prior to being seen in the office, he felt very lightheaded and fell to the floor. Pt DOES state he loss consciousness. Unclear length of time. He noted slight chest pain (2/10) and significant R foot pain, that he says has worsened over the last several days. He states the foot pain started on the way to the Doctor. He improved symptomatically but continued to have pain in his right foot. With recent surgery, sent to [facility] for further evaluation.   Pertinent labs on presentation include K 4.0, Cr 1.00, BNP 177, Trop 0.06, WBC 7.7, or Hgb 8.2. CXR shows left basilar atelectasis and small left pleural effusion. No pneumothorax.  He is resting comfortably in bed on my arrival. He continues to have RLE pain, along the bottom of his lateral foot, and up into his thigh. He states he noticed this pain prior to his syncopal episode and has actually been MORE sore in his RLE over the past several days, but attributed this to recovering from  surgery.  He says he had a near-syncope years ago that felt similar to this after his BP meds were changed, but he did NOT lose consciousness previously. Pre-op weight 220, and discharge weight 228. He states his CP was a 2/10, but different from the surgical "soreness" he has had post op.    LHC 06/08/17 1. Severe 3 vessel obstructive CAD    -99% ostial LAD with TIMI 1 flow. The vessel fills antegrade to the second diagonal. There are good right to left collaterals to the mid to distal LAD. The vessel appears large.    - 95% proximal first diagonal    - 80% proximal ramus intermediate    - 75% large OM1    - 70% proximal RCA 2. Severe LV dysfunction. EF estimated at 20%. 3. Low LVEDP and PCWP c/w effective diuresis RHC Procedural Findings: Hemodynamics (mmHg) RA mean 2 RV 33/0 PA 27/11 PCWP 13 AO 93/53 Cardiac Output (Fick) 7.48 Cardiac Index (Fick) 3.28  Review of systems complete and found to be negative unless listed in HPI.    Home Medications Prior to Admission medications   Medication Sig Start Date End Date Taking? Authorizing Provider  aspirin 325 MG EC tablet Take 1 tablet (325 mg total) by mouth daily. 06/22/17   Sharlene Dory, PA-C  carvedilol (COREG) 6.25 MG tablet Take 1 tablet (6.25 mg total) by mouth 2 (two) times daily with a meal. 06/21/17   Sharlene Dory, PA-C  fenofibrate (  TRICOR) 145 MG tablet TAKE 1 TABLET BY MOUTH EVERY DAY 01/14/17   Swaziland, Peter M, MD  furosemide (LASIX) 40 MG tablet Take 1 tablet (40 mg total) by mouth daily. 06/22/17   Sharlene Dory, PA-C  gabapentin (NEURONTIN) 300 MG capsule Take 300 mg by mouth at bedtime. 03/15/17   [provider]  lisinopril (PRINIVIL,ZESTRIL) 2.5 MG tablet Take 1 tablet (2.5 mg total) by mouth daily. 06/22/17   Sharlene Dory, PA-C  Multiple Vitamins-Minerals (MENS MULTIVITAMIN PLUS PO) Take 1 tablet by mouth 2 (two) times daily.    [provider]  oxyCODONE (OXY IR/ROXICODONE) 5 MG immediate release  tablet Take 1 tablet (5 mg total) by mouth every 4 (four) hours as needed for severe pain. 06/21/17   Sharlene Dory, PA-C  potassium chloride SA (K-DUR,KLOR-CON) 20 MEQ tablet Take 1 tablet (20 mEq total) by mouth daily. 06/22/17   Sharlene Dory, PA-C  zolpidem (AMBIEN) 10 MG tablet Take 10 mg by mouth at bedtime.    [provider]    Past Medical History: Past Medical History:  Diagnosis Date  . Arthritis    "mild; back, neck, spine" (06/09/2017)  . CHF (congestive heart failure) (HCC)   . Coronary artery disease   . History of gout   . History of kidney stones X 2  . Hypercholesterolemia   . Hypertension   . LBBB (left bundle branch block)   . MI (myocardial infarction) (HCC) 1993   LATERAL  . MI (myocardial infarction) (HCC) ?09/2001; 06/07/2017  . Pneumonia    "several times" (06/09/2017)  . S/P coronary artery stent placement    RCA  . Varicose veins     Past Surgical History: Past Surgical History:  Procedure Laterality Date  . ANTERIOR CERVICAL DECOMP/DISCECTOMY FUSION  03/03/2011   Procedure: ANTERIOR CERVICAL DECOMPRESSION/DISCECTOMY FUSION 3 LEVELS;  Surgeon: Karn Cassis, MD;  Location: [facility] NEURO ORS;  Service: Neurosurgery;  Laterality: N/A;  Cervical three-four Cervical four-five Cervical five-six Anterior cervical decompression/diskectomy, fusion  . BACK SURGERY    . CARDIAC CATHETERIZATION  2009   Stents in RCA patent. 70 to 80% PL, and 50% ostial PD.   Marland Kitchen CARDIAC CATHETERIZATION N/A 11/20/2014   Procedure: Left Heart Cath and Coronary Angiography;  Surgeon: Peter M Swaziland, MD;  Location: [facility] INVASIVE CV LAB;  Service: Cardiovascular;  Laterality: N/A;  . CORONARY ANGIOPLASTY     DIRECT ANGIOPLASTY THE MARGINAL BRANCH  . CORONARY ANGIOPLASTY WITH STENT PLACEMENT     RCA  . CORONARY ARTERY BYPASS GRAFT N/A 06/14/2017   Procedure: CORONARY ARTERY BYPASS GRAFTING (CABG) x four, using left internal mammary artery and right leg greater saphenous vein harvested  endoscopically;  Surgeon: Kerin Perna, MD;  Location: [facility] OR;  Service: Open Heart Surgery;  Laterality: N/A;  . CYSTOSCOPY N/A 06/14/2017   Procedure: CYSTOSCOPY;  Surgeon: Donata Clay, Theron Arista, MD;  Location: [facility] OR;  Service: Open Heart Surgery;  Laterality: N/A;  . INSERTION OF SUPRAPUBIC CATHETER N/A 06/14/2017   Procedure: INSERTION OF SUPRAPUBIC CATHETER - Lower abdomen;  Surgeon: Kerin Perna, MD;  Location: [facility] OR;  Service: Open Heart Surgery;  Laterality: N/A;  . POSTERIOR LUMBAR FUSION  10/2011  . RIGHT/LEFT HEART CATH AND CORONARY ANGIOGRAPHY N/A 06/08/2017   Procedure: RIGHT/LEFT HEART CATH AND CORONARY ANGIOGRAPHY;  Surgeon: Swaziland, Peter M, MD;  Location: [facility] INVASIVE CV LAB;  Service: Cardiovascular;  Laterality: N/A;  . TEE WITHOUT CARDIOVERSION N/A 06/14/2017   Procedure:  TRANSESOPHAGEAL ECHOCARDIOGRAM (TEE);  Surgeon: Donata Clay, Theron Arista, MD;  Location: [facility] OR;  Service: Open Heart Surgery;  Laterality: N/A;    Family History:  Family History  Problem Relation Age of Onset  . Heart attack Father   . Diabetes Father   . Breast cancer Mother   . Coronary artery disease Mother   . Heart disease Brother   . Heart attack Maternal Grandfather     Social History: Social History   Socioeconomic History  . Marital status: Married    Spouse name: Not on file  . Number of children: 1  . Years of education: Not on file  . Highest education level: Not on file  Occupational History  . Occupation: Truck Chief Strategy Officer  . Financial resource strain: Not on file  . Food insecurity:    Worry: Not on file    Inability: Not on file  . Transportation needs:    Medical: Not on file    Non-medical: Not on file  Tobacco Use  . Smoking status: Former Smoker    Packs/day: 2.00    Years: 20.00    Pack years: 40.00    Types: Cigarettes    Last attempt to quit: 05/07/1999    Years since quitting: 18.1  . Smokeless tobacco: Never Used  Substance and Sexual Activity  . Alcohol use:  Yes    Comment: 06/09/2017 "1 beer/month; at most"  . Drug use: Never  . Sexual activity: Not on file  Lifestyle  . Physical activity:    Days per week: Not on file    Minutes per session: Not on file  . Stress: Not on file  Relationships  . Social connections:    Talks on phone: Not on file    Gets together: Not on file    Attends religious service: Not on file    Active member of club or organization: Not on file    Attends meetings of clubs or organizations: Not on file    Relationship status: Not on file  Other Topics Concern  . Not on file  Social History Narrative  . Not on file    Allergies:  Allergies  Allergen Reactions  . Ezetimibe Other (See Comments)    Joint pain and drops BP  . Niacin Other (See Comments)    Drops BP and severe  . Statins Other (See Comments)    Severe joint pain and drops BP.    Objective:    Vital Signs:   Temp:  [98.1 F (36.7 C)] 98.1 F (36.7 C) (06/18 1236) Pulse Rate:  [67-76] 67 (06/18 1300) Resp:  [14-24] 14 (06/18 1300) BP: (89-111)/(62-65) 93/63 (06/18 1300) SpO2:  [95 %-100 %] 95 % (06/18 1300) Weight:  [225 lb (102.1 kg)] 225 lb (102.1 kg) (06/18 1239)   Filed Weights   06/22/17 1239  Weight: 225 lb (102.1 kg)     Physical Exam     General:  Fatigued. NAD.  HEENT: Normal Neck: Supple. JVP 12 cm +. Carotids 2+ bilat; no bruits. No lymphadenopathy or thyromegaly appreciated. Cor: PMI nondisplaced. Regular rate & rhythm. No rubs, gallops or murmurs. Lungs: Diminished basilar sounds.  Abdomen: Soft, nontender, nondistended. No hepatosplenomegaly. No bruits or masses. Good bowel sounds. Extremities: No cyanosis, clubbing, or rash. R>L edema. RLE with 2+ edema and tenderness into thighs. Also tender on plantar portion of his R foot.  Neuro: Alert & oriented x 3, cranial nerves grossly intact. moves all 4 extremities w/o difficulty.  Affect pleasant.  Telemetry   NSR 70s, LBBB, personally reviewed.   EKG   NSR 70  bpm, LBBB QRS 186, personally reviewed.   Labs     Basic Metabolic Panel: Recent Labs  Lab 06/15/17 1530  06/16/17 0538 06/17/17 0443 06/18/17 0604 06/19/17 0553 06/22/17 1216 06/22/17 1246  NA  --   --  135 136 136 137 136 134*  K  --   --  4.9 4.5 4.1 3.7 4.3 4.0  CL  --   --  105 101 100* 100* 104 100*  CO2  --   --  25 28 29 30 24   --   GLUCOSE  --   --  144* 114* 106* 123* 114* 110*  BUN  --   --  24* 21* 24* 25* 24* 23*  CREATININE 1.34*  --  1.50* 1.18 1.05 1.02 1.04 1.00  CALCIUM  --    < > 8.4* 8.3* 8.5* 8.7* 8.5*  --   MG 2.3  --   --   --   --   --   --   --    < > = values in this interval not displayed.    Liver Function Tests: Recent Labs  Lab 06/16/17 0538 06/22/17 1216  AST 72* 24  ALT 75* 40  ALKPHOS 24* 61  BILITOT 3.8* 1.6*  PROT 5.5* 5.2*  ALBUMIN 3.3* 2.7*   No results for input(s): LIPASE, AMYLASE in the last 168 hours. No results for input(s): AMMONIA in the last 168 hours.  CBC: Recent Labs  Lab 06/16/17 0538 06/17/17 0443 06/18/17 0604 06/19/17 0553 06/22/17 1216 06/22/17 1246  WBC 8.3 7.1 6.8 5.8 7.7  --   NEUTROABS  --   --   --   --  5.8  --   HGB 8.0* 8.0* 8.4* 8.2* 8.5* 8.2*  HCT 25.1* 24.9* 26.3* 26.0* 28.0* 24.0*  MCV 95.4 96.9 96.7 97.4 100.4*  --   PLT 147* 115* 134* 167 312  --     Cardiac Enzymes: No results for input(s): CKTOTAL, CKMB, CKMBINDEX, TROPONINI in the last 168 hours.  BNP: BNP (last 3 results) Recent Labs    06/07/17 1733  BNP 177.1*    ProBNP (last 3 results) No results for input(s): PROBNP in the last 8760 hours.   CBG: Recent Labs  Lab 06/19/17 1600 06/19/17 2102 06/20/17 0558 06/20/17 0749 06/22/17 1214  GLUCAP 108* 86 103* 95 110*    Coagulation Studies: No results for input(s): LABPROT, INR in the last 72 hours.  Imaging: Dg Chest Port 1 View  Result Date: 06/22/2017 CLINICAL DATA:  Syncopal episode today. Status post CABG 8 days ago. EXAM: PORTABLE CHEST 1 VIEW  COMPARISON:  PA and lateral chest x-ray of June 18, 2017 FINDINGS: The right lung is well-expanded and clear. On the left there is persistent basilar atelectasis and small effusion. There is no pneumothorax. The cardiac silhouette is mildly enlarged. The pulmonary vascularity is not clearly engorged. The sternal wires are intact. There is calcification in the wall of the aortic arch. The observed bony structures exhibit no acute abnormalities. IMPRESSION: Persistent left basilar atelectasis and small left pleural effusion. Clear right lung. No overt CHF. No pneumothorax. Electronically Signed   By: David  Swaziland M.D.   On: 06/22/2017 12:35      Patient Profile   [name] is a 62 y.o. male with CAD s/p CABG x 4 06/14/17, HLD, HTN, chronic systolic CHF, CKD II,  and urethral stricture s/p suprapubic catheterization.   He presented to [facility] 06/22/17 with near syncope following discharge from hospital 06/21/17 after recent CABG  Assessment/Plan   1. Syncope - Unclear duration of LOC in Urology office this am.  - Cannot r/o tachyarrhythmia with LBBB. May need LifeVest if no clear etiology found.  - ? PE/DVT contributing with RLE pain and unilateral swelling (thought confounded by recent CABG with RLE vein harvest site)  2. Acute on chronic systolic CHF - Echo 06/09/17 (PRE CABG) LVEF 20-25%, Coarse trabeculation/false tendonae noted, aortic sclerosis without significant stenosis, Trivial MR.  - Volume status elevated on exam - Received 1 L of fluid in ED.  - Will give IV lasix 80 mg BID.  - Resume coreg 6.25 mg BID - Transition lisinopril to losartan 25 mg daily for Entresto consideration.  - Continue potassium 20 meq daily.   - Consider spiro - Consider digoxin.   3. CAD s/p CABG 06/14/2017 (LIMA to LAD, SVG to Dx, SVG to Ramus Intermedius, SVG to RCA)  - 2/10 CP this am in setting of fall. Troponin negative this am.  - Will ask surgery to see as well - Continue ASA and statin.  - Resume BB  as tolerated  4. R>LLE edema - Will order DVT US and CTA to r/o DVT/PE with presentation - Tenderness seems out of proportion to vein harvest.   5. HTN - Meds as above.   6. CKD II - Follow closely with diuresis.   7. Urethral stricture s/p suprapubic catheterization - Did not complete urology visit due to syncope.  - May need urology to see in hospital if prolonged admission.  - Spoke with Dr. Marlou Porch urology personally. Needs multi-phase testing for removal, but OK to cap and see if he can urinate. They will look in on.   Will admit to tele. If no clear etiology, will likely need Lifevest.   Graciella Freer, PA-C 06/22/2017, 1:48 PM  Advanced Heart Failure Team Pager [phone] (M-F; 7a - 4p)  Please contact [facility] for night-coverage after hours (4p -7a ) and weekends on [email or web address]  Patient seen with PA, agree with the above note.    Patient was recently discharged after CABG.  EF was 20-25% during that admission.  Since getting home, he has continued to be short of breath with exertion (was short of breath prior to discharge).  His right leg (vein harvest leg) has been swelling and uncomfortable.  Today, he was at the urology office and felt lightheaded followed by syncope.  He was sitting down at the time.  He denies tachypalpitations. Currently, he is in the ER.  SBP in 120s.  HR in 60s, NSR with LBBB (he had LBBB prior to CABG).   On exam, JVP 12 cm.  Right leg 2+ edema to knee.  Left leg trace ankle edema.  Regular S1S2 with paradoxically split S2, 2/6 SEM RUSB.  Clear lungs.   1. Syncope: Unclear cause.  With low EF, ventricular arrhythmia is certainly a concern.  Also, with LBBB, progressive conduction disease with bradyarrhythmia is a possibility.  Certainly could have been a vagal event, but he had no trigger (was not in pain, just sitting in urology office prior to his appt).  - Monitor on telemetry.  - If no cause ascertained, think he will need a Lifevest.    2. Acute on chronic systolic CHF: Ischemic cardiomyopathy.  Echo during last admission with EF 20-25%.  Echo today with EF up  to 35-40%.  He is volume overloaded on exam, NYHA class III symptoms at home.  - Lasix 80 mg IV bid. Strict I/Os.  - For now, unless bradyarrhythmia is seen, continue Coreg 6.25 mg bid.  - Would stop lisinopril and start losartan 25 mg daily, aim for possible transition to Entresto in the future based on BP.  - Would like him on spironolactone eventually.  - As above, if no definite cause for syncope is found, would recommend Lifevest until repeat echo in 3 months (even though EF up to 35%).  3. Unilateral leg swelling and dyspnea: Plan for CTA chest to rule out PE and will get venous dopplers right lower leg.  4. CAD: S/p recent CABG.  Do not suspect ACS.  - Continue ASA.  - Cannot tolerate statins or Zetia.  Will refer to lipid clinic for Repatha after discharge.  5. Urethral stricture with suprapubic catheter: Did not see urologist yesterday because taken away by EMS after syncope.  Will see if urology will see him while and inpatient.   Marca Ancona 06/22/2017 3:43 PM

## 2017-06-22 NOTE — Progress Notes (Signed)
  Echocardiogram 2D Echocardiogram has been performed.  [name] 06/22/2017, 2:05 PM

## 2017-06-22 NOTE — Progress Notes (Signed)
Pt c/o constipation since 06/16/17 despite taking Colace OTC at home. Pt having flatus and smear of bowel tonight. Milk of Magnesium given per Cardiology PRN standing orders. Will continue to monitor.

## 2017-06-22 NOTE — ED Triage Notes (Signed)
Patient had OHS and was discharged home yesterday. While in the MD office today, the patient became weak and dizzy.  EMS called and patient was lowered to the floor. 500 cc fluid bolus given per EMS. EKG showed left BBB.

## 2017-06-22 NOTE — ED Notes (Signed)
Called 3E to give report. Receiving RN unable to take report at this time 

## 2017-06-23 ENCOUNTER — Encounter (HOSPITAL_COMMUNITY): Payer: Self-pay | Admitting: Cardiology

## 2017-06-23 ENCOUNTER — Telehealth (HOSPITAL_COMMUNITY): Payer: Self-pay

## 2017-06-23 ENCOUNTER — Inpatient Hospital Stay (HOSPITAL_COMMUNITY): Payer: BLUE CROSS/BLUE SHIELD

## 2017-06-23 DIAGNOSIS — M7989 Other specified soft tissue disorders: Secondary | ICD-10-CM

## 2017-06-23 LAB — BASIC METABOLIC PANEL
Anion gap: 8 (ref 5–15)
BUN: 26 mg/dL — ABNORMAL HIGH (ref 6–20)
CO2: 25 mmol/L (ref 22–32)
Calcium: 8.6 mg/dL — ABNORMAL LOW (ref 8.9–10.3)
Chloride: 103 mmol/L (ref 101–111)
Creatinine, Ser: 1.04 mg/dL (ref 0.61–1.24)
GFR calc Af Amer: >60 mL/min (ref >60)
GFR calc non Af Amer: >60 mL/min (ref >60)
Glucose, Bld: 107 mg/dL — ABNORMAL HIGH (ref 65–99)
Potassium: 3.7 mmol/L (ref 3.5–5.1)
Sodium: 136 mmol/L (ref 135–145)

## 2017-06-23 LAB — MAGNESIUM: Magnesium: 2.2 mg/dL (ref 1.7–2.4)

## 2017-06-23 MED ORDER — BISACODYL 5 MG PO TBEC
5.0000 mg | DELAYED_RELEASE_TABLET | Freq: Every day | ORAL | Status: DC | PRN
  Administered 2017-06-23 – 2017-06-24 (×2): 5 mg via ORAL
  Filled 2017-06-23 (×2): qty 1

## 2017-06-23 MED ORDER — POTASSIUM CHLORIDE CRYS ER 20 MEQ PO TBCR
40.0000 meq | EXTENDED_RELEASE_TABLET | Freq: Once | ORAL | Status: AC
  Administered 2017-06-23: 40 meq via ORAL
  Filled 2017-06-23: qty 2

## 2017-06-23 MED ORDER — DOCUSATE SODIUM 100 MG PO CAPS
100.0000 mg | ORAL_CAPSULE | Freq: Every day | ORAL | Status: DC
  Administered 2017-06-23 – 2017-06-25 (×3): 100 mg via ORAL
  Filled 2017-06-23 (×3): qty 1

## 2017-06-23 MED ORDER — MAGNESIUM HYDROXIDE 400 MG/5ML PO SUSP
30.0000 mL | Freq: Once | ORAL | Status: AC
  Administered 2017-06-23: 30 mL via ORAL
  Filled 2017-06-23: qty 30

## 2017-06-23 NOTE — Telephone Encounter (Signed)
Will contact to see if he is interested. If interested patient will need to complete f/u appointment. Once completed for scheduling upon review by the RN Navigator.  Will contact patient to see if he in interested. If interested patient will be contact for scheduling upon review by the RN Navigator.

## 2017-06-23 NOTE — Consult Note (Addendum)
Cardiology Consultation:  Electrophysiology consult   Patient ID: [name]; [id number]; 08/01/1955   Admit date: 06/22/2017 Date of Consult: 06/23/2017  Primary Care Provider: Jarome Matin, MD Primary Cardiologist: Peter Swaziland, MD  Primary Electrophysiologist:  NEW    Patient Profile:   [name] is a 62 y.o. male with a hx of CAD and recent CABG of 06/14/17, HLD, HTN and chronic systolic HF, CKD-2, with urethral stricture and s/p suprapubic catheter who is being seen today for the evaluation of NSVT and syncope at the request of Dr. Shirlee Latch.  History of Present Illness:   [name] has a hx of CAD and recent CABG of 06/14/17, HLD, HTN and chronic systolic HF, CKD-2, with urethral stricture and s/p suprapubic catheter.  06/07/17 pt with flash pulmonary edema and NSTEMI, Pk trop. Of 0.28 and cath with severe 3vCAD.  EF was 20% and he had CABG with LIMA to LAD, SVG to diag, SVG to ramus intermedius, SVG to RCA, hx of endoscopic harvest of Rt leg greater SV vein. Placement of left ventricular epicardial lead which was tunneled to the left infraclavicular space was done as well.  Mild-moderate aortic insufficiency. Chronic LBBB.  D/c'd 06/21/17.       Pt presented to ER 06/22/17 after being in urology office for problems with supra pubic catheter-in waiting room he felt very lightheaded and fell to the floor.  He did lose consciousness.   He did have slight chest pain at home and Rt foot pain that had increased.   He continued with the RLE pain. The chest pain was different than surgical soreness he had before.     This AM he had tachycardia at 170 and pt became weak, lightheaded and some diaphoresis like yesterday but not as severe.  And he did not pass out.    EKG in ER SR at 70 with his chronic LBBB, QRS 186 I personally reviewed.  Tele, SR with LBBB and 06/23/17 at 11:17 pt with tachycardia  HR 170, in leads II, V, III and MCl QRS does not change deflection of QRS.  Also around 7:15 AM  pt has similar arrhythmia but slower and irregular- no P wave, atrial fib.  Pt does not remember being symptomatic then.      Troponin Poc, 0.06, Na 136, K+ 3.7, Cr 1.04  Mg+ 2.2  hgb is 8.5 and hxt 28, WBC 7.7  CTA of chest No evidence of pulmonary embolism. Evidence of recent CABG with small left effusion and associated left basilar atelectasis. Minimal dependent right basilar atelectasis. Moderate cardiomegaly.  Echo 06/22/17 with mod LVH, EF 35-40%, improved with incoordinate septal motion, RWMA cannot be excluded,  Mild AR, trivial MR, LA was normal in size.  Also trivial post surgical posterior pericardial effusion.    Rt venous doppler with neg for DVT. He is on lasix for acute on chronic systolic HF with ischemic cardiomyopathy.    Currently resting comfortably.  No chest pain and no SOB, still with some Rt foot pain.    Past Medical History:  Diagnosis Date  . Arthritis    "mild; back, neck, spine" (06/09/2017)  . CHF (congestive heart failure) (HCC)   . Coronary artery disease   . History of gout   . History of kidney stones X 2  . Hypercholesterolemia   . Hypertension   . LBBB (left bundle branch block)   . MI (myocardial infarction) (HCC) 1993   LATERAL  . MI (myocardial infarction) (HCC) ?09/2001;  06/07/2017  . Pneumonia    "several times" (06/09/2017)  . S/P coronary artery stent placement    RCA  . Varicose veins     Past Surgical History:  Procedure Laterality Date  . ANTERIOR CERVICAL DECOMP/DISCECTOMY FUSION  03/03/2011   Procedure: ANTERIOR CERVICAL DECOMPRESSION/DISCECTOMY FUSION 3 LEVELS;  Surgeon: Karn Cassis, MD;  Location: [facility] OR[facility];  Service: Neurosurgery;  Laterality: N/A;  Cervical three-four Cervical four-five Cervical five-six Anterior cervical decompression/diskectomy, fusion  . BACK SURGERY    . CARDIAC CATHETERIZATION  2009   Stents in RCA patent. 70 to 80% PL, and 50% ostial PD.   Marland Kitchen CARDIAC CATHETERIZATION N/A 11/20/2014   Procedure:  Left Heart Cath and Coronary Angiography;  Surgeon: Peter M Swaziland, MD;  Location: [facility] INVASIVE CV LAB;  Service: Cardiovascular;  Laterality: N/A;  . CORONARY ANGIOPLASTY     DIRECT ANGIOPLASTY THE MARGINAL BRANCH  . CORONARY ANGIOPLASTY WITH STENT PLACEMENT     RCA  . CORONARY ARTERY BYPASS GRAFT N/A 06/14/2017   Procedure: CORONARY ARTERY BYPASS GRAFTING (CABG) x four, using left internal mammary artery and right leg greater saphenous vein harvested endoscopically;  Surgeon: Kerin Perna, MD;  Location: [facility] OR;  Service: Open Heart Surgery;  Laterality: N/A;  . CYSTOSCOPY N/A 06/14/2017   Procedure: CYSTOSCOPY;  Surgeon: Donata Clay, Theron Arista, MD;  Location: [facility] OR;  Service: Open Heart Surgery;  Laterality: N/A;  . INSERTION OF SUPRAPUBIC CATHETER N/A 06/14/2017   Procedure: INSERTION OF SUPRAPUBIC CATHETER - Lower abdomen;  Surgeon: Kerin Perna, MD;  Location: [facility] OR;  Service: Open Heart Surgery;  Laterality: N/A;  . POSTERIOR LUMBAR FUSION  10/2011  . RIGHT/LEFT HEART CATH AND CORONARY ANGIOGRAPHY N/A 06/08/2017   Procedure: RIGHT/LEFT HEART CATH AND CORONARY ANGIOGRAPHY;  Surgeon: Swaziland, Peter M, MD;  Location: [facility] INVASIVE CV LAB;  Service: Cardiovascular;  Laterality: N/A;  . TEE WITHOUT CARDIOVERSION N/A 06/14/2017   Procedure: TRANSESOPHAGEAL ECHOCARDIOGRAM (TEE);  Surgeon: Donata Clay, Theron Arista, MD;  Location: [facility] OR;  Service: Open Heart Surgery;  Laterality: N/A;     Home Medications:  Prior to Admission medications   Medication Sig Start Date End Date Taking? Authorizing Provider  aspirin 325 MG EC tablet Take 1 tablet (325 mg total) by mouth daily. 06/22/17  Yes Asa Lente, Tessa N, PA-C  carvedilol (COREG) 6.25 MG tablet Take 1 tablet (6.25 mg total) by mouth 2 (two) times daily with a meal. 06/21/17  Yes Conte, Tessa N, PA-C  Evolocumab (REPATHA SURECLICK) 140 MG/ML SOAJ Inject 140 mg into the skin every 14 (fourteen) days.    Yes [provider]  fenofibrate (TRICOR) 145 MG tablet  TAKE 1 TABLET BY MOUTH EVERY DAY 01/14/17  Yes Swaziland, Peter M, MD  furosemide (LASIX) 40 MG tablet Take 1 tablet (40 mg total) by mouth daily. 06/22/17  Yes Conte, Tessa N, PA-C  gabapentin (NEURONTIN) 300 MG capsule Take 300 mg by mouth at bedtime. 03/15/17  Yes [provider]  lisinopril (PRINIVIL,ZESTRIL) 2.5 MG tablet Take 1 tablet (2.5 mg total) by mouth daily. 06/22/17  Yes Conte, Tessa N, PA-C  Multiple Vitamins-Minerals (MENS MULTIVITAMIN PLUS PO) Take 1 tablet by mouth 2 (two) times daily.   Yes [provider]  oxyCODONE (OXY IR/ROXICODONE) 5 MG immediate release tablet Take 1 tablet (5 mg total) by mouth every 4 (four) hours as needed for severe pain. 06/21/17  Yes Conte, Tessa N, PA-C  potassium chloride SA (K-DUR,KLOR-CON) 20 MEQ tablet Take 1 tablet (  20 mEq total) by mouth daily. 06/22/17  Yes Conte, Tessa N, PA-C  zolpidem (AMBIEN) 10 MG tablet Take 10 mg by mouth at bedtime.   Yes [provider]    Inpatient Medications: Scheduled Meds: . aspirin  325 mg Oral Daily  . carvedilol  6.25 mg Oral BID WC  . docusate sodium  100 mg Oral Daily  . enoxaparin (LOVENOX) injection  40 mg Subcutaneous Q24H  . fenofibrate  160 mg Oral Daily  . furosemide  80 mg Intravenous BID  . gabapentin  300 mg Oral QHS  . losartan  25 mg Oral QHS  . potassium chloride SA  20 mEq Oral Daily  . potassium chloride  40 mEq Oral Once  . sodium chloride flush  3 mL Intravenous Q12H  . zolpidem  10 mg Oral QHS   Continuous Infusions: . sodium chloride     PRN Meds: sodium chloride, acetaminophen, bisacodyl, ondansetron (ZOFRAN) IV, oxyCODONE, sodium chloride flush  Allergies:    Allergies  Allergen Reactions  . Ezetimibe Other (See Comments)    Joint pain and drops BP  . Niacin Other (See Comments)    Drops BP and severe  . Statins Other (See Comments)    Severe joint pain and drops BP.    Social History:   Social History   Socioeconomic History  . Marital  status: Married    Spouse name: Not on file  . Number of children: 1  . Years of education: Not on file  . Highest education level: Not on file  Occupational History  . Occupation: Truck Chief Strategy Officer  . Financial resource strain: Not on file  . Food insecurity:    Worry: Not on file    Inability: Not on file  . Transportation needs:    Medical: Not on file    Non-medical: Not on file  Tobacco Use  . Smoking status: Former Smoker    Packs/day: 2.00    Years: 20.00    Pack years: 40.00    Types: Cigarettes    Last attempt to quit: 05/07/1999    Years since quitting: 18.1  . Smokeless tobacco: Never Used  Substance and Sexual Activity  . Alcohol use: Yes    Comment: 06/09/2017 "1 beer/month; at most"  . Drug use: Never  . Sexual activity: Not on file  Lifestyle  . Physical activity:    Days per week: Not on file    Minutes per session: Not on file  . Stress: Not on file  Relationships  . Social connections:    Talks on phone: Not on file    Gets together: Not on file    Attends religious service: Not on file    Active member of club or organization: Not on file    Attends meetings of clubs or organizations: Not on file    Relationship status: Not on file  . Intimate partner violence:    Fear of current or ex partner: Not on file    Emotionally abused: Not on file    Physically abused: Not on file    Forced sexual activity: Not on file  Other Topics Concern  . Not on file  Social History Narrative  . Not on file    Family History:    Family History  Problem Relation Age of Onset  . Heart attack Father   . Diabetes Father   . Breast cancer Mother   . Coronary artery disease Mother   .  Heart disease Brother   . Heart attack Maternal Grandfather      ROS:  Please see the history of present illness.  General:no colds or fevers, + weight changes with diuresis Skin:no rashes or ulcers HEENT:no blurred vision, no congestion CV:see HPI PUL:see HPI GI:no  diarrhea constipation or melena, no indigestion GU:no hematuria, no dysuria MS:no joint pain, no claudication, + rt foot pain, hx of back surgery Neuro:+ syncope, + lightheadedness Endo:no diabetes, no thyroid disease  All other ROS reviewed and negative.     Physical Exam/Data:   Vitals:   06/23/17 0438 06/23/17 1009 06/23/17 1053 06/23/17 1251  BP: 115/61 100/60 (!) 95/59 113/69  Pulse: 80 80 87 84  Resp: 16   18  Temp: 98.7 F (37.1 C)   98.7 F (37.1 C)  TempSrc: Oral   Oral  SpO2: 96% 95% 95% 91%  Weight: 221 lb 11.2 oz (100.6 kg)     Height:        Intake/Output Summary (Last 24 hours) at 06/23/2017 1333 Last data filed at 06/23/2017 1202 Gross per 24 hour  Intake 2223 ml  Output 6425 ml  Net -4202 ml   Filed Weights   06/22/17 1239 06/22/17 1632 06/23/17 0438  Weight: 225 lb (102.1 kg) 224 lb 9.6 oz (101.9 kg) 221 lb 11.2 oz (100.6 kg)   Body mass index is 28.46 kg/m.  General:  Well nourished, well developed, in no acute distress HEENT: normal Lymph: no adenopathy Neck: mild JVD Endocrine:  No thryomegaly Vascular: No carotid bruits; pedal pulses 2+ bilaterally  Cardiac:  normal S1, S2; RRR; no murmur, gallup rub or click chest wall incision healing, tender over site of LV lead, Lt subclavian area Lungs:  clear to auscultation bilaterally, no wheezing, rhonchi or rales  Abd: soft, nontender, no hepatomegaly  Ext: + 2 edema of Lt leg. Musculoskeletal:  No deformities, BUE and BLE strength normal and equal Skin: warm and dry  Neuro:  Alert and oriented X 3 MAE follows commands, no focal abnormalities noted Psych:  Normal affect    Relevant CV Studies: Rt and Lt cardiac cath  06-30-17 Procedures   RIGHT/LEFT HEART CATH AND CORONARY ANGIOGRAPHY  Conclusion     Prox RCA lesion is 70% stenosed.  Mid RCA lesion is 50% stenosed.  Previously placed Mid RCA to Dist RCA stent (unknown type) is widely patent.  Previously placed Dist RCA stent (unknown  type) is widely patent.  Post Atrio lesion is 50% stenosed.  Ost LAD to Prox LAD lesion is 99% stenosed.  Ost 1st Diag to 1st Diag lesion is 95% stenosed.  Ost Ramus to Ramus lesion is 80% stenosed.  Ost 1st Mrg lesion is 75% stenosed.  There is severe left ventricular systolic dysfunction.  LV end diastolic pressure is normal.  The left ventricular ejection fraction is less than 25% by visual estimate.  LV end diastolic pressure is normal.   1. Severe 3 vessel obstructive CAD    -99% ostial LAD with TIMI 1 flow. The vessel fills antegrade to the second diagonal. There are good right to left collaterals to the mid to distal LAD. The vessel appears large.    - 95% proximal first diagonal    - 80% proximal ramus intermediate    - 75% large OM1    - 70% proximal RCA 2. Severe LV dysfunction. EF estimated at 20%. 3. Low LVEDP and PCWP c/w effective diuresis 4. Normal right heart pressures 5. Normal cardiac output. Index  3.28.   Plan: Recommend CT surgery consult for revascularization- message sent. Echo pending. The ostial LAD is not suitable for PCI. Optimize medical therapy for CHF. Pairlee Sawtell discontinue amlodipine. Start Coreg. Consider Entresto and aldactone. If pacemaker/ICD needed in the future would recommend using a left subclavian approach.     ECHO 06/09/17 Study Conclusions  - Left ventricle: The cavity size was normal. Wall thickness was   increased in a pattern of moderate LVH. Incoordinate septal   motion. Systolic function was severely reduced. The estimated   ejection fraction was in the range of 20% to 25%. Diffuse   hypokinesis. Coarse trabeulcation / false tendonae at the LV apex   - Definity contrast seen filling a network structure, without   filling defect suggestive of thrombus. The study is not   technically sufficient to allow evaluation of LV diastolic   function. - Aortic valve: Trileaflet. Sclerosis without stenosis. There was   moderate  regurgitation. - Aorta: Ascending aortic diameter: 41 mm (S). - Ascending aorta: The ascending aorta was mildly dilated. - Mitral valve: Mildly thickened leaflets . There was trivial   regurgitation. - Left atrium: The atrium was normal in size. - Atrial septum: No defect or patent foramen ovale was identified. - Inferior vena cava: The vessel was normal in size. The   respirophasic diameter changes were in the normal range (>= 50%),   consistent with normal central venous pressure.  Impressions:  - Compared to a prior study in 2016, the LVEF is much lower at   20-25%. There is coarse trabeulation ./ false tendonae at the LV   apex - Definity contrast opacifies the LV and fills the void up   to the myocardium and therefore is negative for apical thrombus.   The ascending aorta is dilated to 4.1 cm.  Echo limited 06/22/17  Study Conclusions  - Left ventricle: The cavity size was normal. Wall thickness was   increased in a pattern of moderate LVH. Systolic function was   moderately reduced. The estimated ejection fraction was in the   range of 35% to 40%. Incoordinate septal motion - cannot exclude   regional wall motion abnormalities. Doppler parameters are   consistent with abnormal left ventricular relaxation (grade 1   diastolic dysfunction). The E/e&' ratio is between 8-15,   suggesting indeterminate LV filling pressure. - Aortic valve: Sclerosis without stenosis. There was mild   regurgitation. - Mitral valve: Mildly thickened leaflets . There was trivial   regurgitation. - Left atrium: The atrium was normal in size. - Inferior vena cava: The vessel was dilated. The respirophasic   diameter changes were blunted (< 50%), consistent with elevated   central venous pressure. - Pericardium, extracardiac: Trivial posterior pericardial   effusion.  Impressions:  - Compared to a recent echo on 06/09/2017, the LVEF is improved to   35-40% with incoordinate septal motion -  regional wall motion   abnormalities cannot be excluded. There is a trivial (probably   post-surgical) posterior pericardial effusion.  Laboratory Data:  Chemistry Recent Labs  Lab 06/19/17 0553 06/22/17 1216 06/22/17 1246 06/23/17 0532  NA 137 136 134* 136  K 3.7 4.3 4.0 3.7  CL 100* 104 100* 103  CO2 30 24  --  25  GLUCOSE 123* 114* 110* 107*  BUN 25* 24* 23* 26*  CREATININE 1.02 1.04 1.00 1.04  CALCIUM 8.7* 8.5*  --  8.6*  GFRNONAA >60 >60  --  >60  GFRAA >60 >60  --  >  60  ANIONGAP 7 8  --  8    Recent Labs  Lab 06/22/17 1216  PROT 5.2*  ALBUMIN 2.7*  AST 24  ALT 40  ALKPHOS 61  BILITOT 1.6*   Hematology Recent Labs  Lab 06/18/17 0604 06/19/17 0553 06/22/17 1216 06/22/17 1246  WBC 6.8 5.8 7.7  --   RBC 2.72* 2.67* 2.79*  --   HGB 8.4* 8.2* 8.5* 8.2*  HCT 26.3* 26.0* 28.0* 24.0*  MCV 96.7 97.4 100.4*  --   MCH 30.9 30.7 30.5  --   MCHC 31.9 31.5 30.4  --   RDW 14.5 14.5 15.2  --   PLT 134* 167 312  --    Cardiac EnzymesNo results for input(s): TROPONINI in the last 168 hours.  Recent Labs  Lab 06/22/17 1243  TROPIPOC 0.06    BNPNo results for input(s): BNP, PROBNP in the last 168 hours.  DDimer No results for input(s): DDIMER in the last 168 hours.  Radiology/Studies:  Ct Angio Chest Pe W/cm &/or Wo Cm  Result Date: 06/22/2017 CLINICAL DATA:  Syncope today.  CABG 3 weeks ago. EXAM: CT ANGIOGRAPHY CHEST WITH CONTRAST TECHNIQUE: Multidetector CT imaging of the chest was performed using the standard protocol during bolus administration of intravenous contrast. Multiplanar CT image reconstructions and MIPs were obtained to evaluate the vascular anatomy. CONTRAST:  ISOVUE-370 IOPAMIDOL (ISOVUE-370) INJECTION 76% COMPARISON:  06/07/2017 FINDINGS: Cardiovascular: Moderate cardiomegaly is present. There is calcified plaque over the left main and 3 vessel coronary arteries. Mild calcified plaque over the thoracic aorta. No evidence of pulmonary embolism.  Mediastinum/Nodes: Evidence patient's recent median sternotomy and CABG. Few small patchy mediastinal lymph nodes and fluid compatible with recent postop state. Tiny amount of air over the left anterior lower mediastinum. Lungs/Pleura: Lungs are adequately inflated demonstrate a small left-sided pleural effusion with associated atelectasis. Minimal posterior right basilar atelectasis. Resolution of previously seen interstitial edema. Airways are within normal. Upper Abdomen: No acute abnormality. Musculoskeletal: Mild degenerate change of the spine. Review of the MIP images confirms the above findings. IMPRESSION: No evidence of pulmonary embolism. Evidence of recent CABG with small left effusion and associated left basilar atelectasis. Minimal dependent right basilar atelectasis. Moderate cardiomegaly. Electronically Signed   By: Elberta Fortis M.D.   On: 06/22/2017 16:40   Dg Chest Port 1 View  Result Date: 06/22/2017 CLINICAL DATA:  Syncopal episode today. Status post CABG 8 days ago. EXAM: PORTABLE CHEST 1 VIEW COMPARISON:  PA and lateral chest x-ray of June 18, 2017 FINDINGS: The right lung is well-expanded and clear. On the left there is persistent basilar atelectasis and small effusion. There is no pneumothorax. The cardiac silhouette is mildly enlarged. The pulmonary vascularity is not clearly engorged. The sternal wires are intact. There is calcification in the wall of the aortic arch. The observed bony structures exhibit no acute abnormalities. IMPRESSION: Persistent left basilar atelectasis and small left pleural effusion. Clear right lung. No overt CHF. No pneumothorax. Electronically Signed   By: David  Swaziland M.D.   On: 06/22/2017 12:35    Assessment and Plan:   1. Tachycardia- NSVT- with symptoms similar to yesterday.  Yesterday with syncope.  Dr. Elberta Fortis to see and eval.  Decide if EP study needed or ICD vs d/c with lifevest and re-eval..  Also slower episode around 7:15 AM asymptomatic but  that appears a fib.   2. Syncope see above. 3. Chronic LBBB and with significant Lt ventricular dysfunction and CHF, LV lead was  placed with surgery.  Site mildly tender. 4. CAD with recent NSTEMI and severe CAD, with CABG X 4 06/14/17.  5. ICM with prior EF 20% now 35-40%.  6. Urinary stricture with suprapubic cath.       For questions or updates, please contact [facility] Please consult [email or web address] for contact info under Cardiology/STEMI.   Signed, Nada Boozer, NP  06/23/2017 1:33 PM  I have seen and examined this patient with Nada Boozer.  Agree with above, note added to reflect my findings.  On exam, RRR, no murmurs, lungs clear.   Patient is currently 2 weeks out from CABG.  His ejection fraction on his most recent echo was 35 to 40%.  He presented to the hospital with an episode of syncope yesterday.  In review of his telemetry, he did have a nonsustained tachycardia that was approximately 30 beats long.  He felt very similar with this episode as he did with his prior.  Based on the 2 leads on his telemetry, his QRS morphology does not change much at all, and thus this could be due to SVT versus VT.  At this point, would titrate his beta-blockers up, and potentially fit him with a LifeVest to return home.  He would need an echo 3 months after his surgery.  He would also benefit potentially from an EP study to see if he has inducible ventricular tachycardia with possible ICD implant at that time. Dequann Vandervelden M. Dejanae Helser MD 06/23/2017 7:02 PM

## 2017-06-23 NOTE — Progress Notes (Signed)
*  Preliminary Results* Right lower extremity venous duplex completed. Right lower extremity is negative for deep vein thrombosis. There is no evidence of right Baker's cyst. Incidental finding: there is a heterogenous area that extends down the medial aspect of the right thigh, consistent with recent vein harvest for CABG.  06/23/2017 12:14 PM  Gertie Fey, BS, RVT, RDCS, RDMS

## 2017-06-23 NOTE — Progress Notes (Addendum)
Advanced Heart Failure Rounding Note  PCP-Cardiologist: Peter Swaziland, MD   Subjective:    CTA chest 06/22/17 negative for PE.  Venous dopplers 06/23/17 negative for DVT.   Feeling better this am. Had episode of NSVT around 1115 this am. Felt like his BP was dropping similar to his episode yesterday.  Swelling has improved, but noted to have occasional O2 desaturations.   Weight down 3 lbs. Creatinine stable. K 3.7.  Objective:   Weight Range: 221 lb 11.2 oz (100.6 kg) Body mass index is 28.46 kg/m.   Vital Signs:   Temp:  [98.1 F (36.7 C)-98.9 F (37.2 C)] 98.7 F (37.1 C) (06/19 0438) Pulse Rate:  [67-91] 87 (06/19 1053) Resp:  [14-26] 16 (06/19 0438) BP: (89-126)/(55-83) 95/59 (06/19 1053) SpO2:  [92 %-100 %] 95 % (06/19 1053) Weight:  [221 lb 11.2 oz (100.6 kg)-225 lb (102.1 kg)] 221 lb 11.2 oz (100.6 kg) (06/19 0438) Last BM Date: 06/23/17  Weight change: Filed Weights   06/22/17 1239 06/22/17 1632 06/23/17 0438  Weight: 225 lb (102.1 kg) 224 lb 9.6 oz (101.9 kg) 221 lb 11.2 oz (100.6 kg)    Intake/Output:   Intake/Output Summary (Last 24 hours) at 06/23/2017 1213 Last data filed at 06/23/2017 1058 Gross per 24 hour  Intake 2223 ml  Output 5225 ml  Net -3002 ml    Physical Exam    General:  Well appearing. No resp difficulty HEENT: Normal Neck: Supple. JVP 7-8 cm. Carotids 2+ bilat; no bruits. No lymphadenopathy or thyromegaly appreciated. Cor: PMI nondisplaced. Regular rate & rhythm. No rubs, gallops or murmurs. Lungs: Clear Abdomen: Soft, nontender, nondistended. No hepatosplenomegaly. No bruits or masses. Good bowel sounds. Extremities: No cyanosis, clubbing, rash, edema Neuro: Alert & orientedx3, cranial nerves grossly intact. moves all 4 extremities w/o difficulty. Affect pleasant  Telemetry   NSR 80-90s, with episode of 15-17 beat run of NSVT this am, personally reviewed.   EKG    No new tracings.    Labs    CBC Recent Labs     06/22/17 1216 06/22/17 1246  WBC 7.7  --   NEUTROABS 5.8  --   HGB 8.5* 8.2*  HCT 28.0* 24.0*  MCV 100.4*  --   PLT 312  --    Basic Metabolic Panel Recent Labs    96/78/93 1216 06/22/17 1246 06/23/17 0532  NA 136 134* 136  K 4.3 4.0 3.7  CL 104 100* 103  CO2 24  --  25  GLUCOSE 114* 110* 107*  BUN 24* 23* 26*  CREATININE 1.04 1.00 1.04  CALCIUM 8.5*  --  8.6*   Liver Function Tests Recent Labs    06/22/17 1216  AST 24  ALT 40  ALKPHOS 61  BILITOT 1.6*  PROT 5.2*  ALBUMIN 2.7*   No results for input(s): LIPASE, AMYLASE in the last 72 hours. Cardiac Enzymes No results for input(s): CKTOTAL, CKMB, CKMBINDEX, TROPONINI in the last 72 hours.  BNP: BNP (last 3 results) Recent Labs    06/07/17 1733  BNP 177.1*    ProBNP (last 3 results) No results for input(s): PROBNP in the last 8760 hours.   D-Dimer No results for input(s): DDIMER in the last 72 hours. Hemoglobin A1C No results for input(s): HGBA1C in the last 72 hours. Fasting Lipid Panel No results for input(s): CHOL, HDL, LDLCALC, TRIG, CHOLHDL, LDLDIRECT in the last 72 hours. Thyroid Function Tests No results for input(s): TSH, T4TOTAL, T3FREE, THYROIDAB in the last 72  hours.  Invalid input(s): FREET3  Other results:   Imaging    Ct Angio Chest Pe W/cm &/or Wo Cm  Result Date: 06/22/2017 CLINICAL DATA:  Syncope today.  CABG 3 weeks ago. EXAM: CT ANGIOGRAPHY CHEST WITH CONTRAST TECHNIQUE: Multidetector CT imaging of the chest was performed using the standard protocol during bolus administration of intravenous contrast. Multiplanar CT image reconstructions and MIPs were obtained to evaluate the vascular anatomy. CONTRAST:  ISOVUE-370 IOPAMIDOL (ISOVUE-370) INJECTION 76% COMPARISON:  06/07/2017 FINDINGS: Cardiovascular: Moderate cardiomegaly is present. There is calcified plaque over the left main and 3 vessel coronary arteries. Mild calcified plaque over the thoracic aorta. No evidence of  pulmonary embolism. Mediastinum/Nodes: Evidence patient's recent median sternotomy and CABG. Few small patchy mediastinal lymph nodes and fluid compatible with recent postop state. Tiny amount of air over the left anterior lower mediastinum. Lungs/Pleura: Lungs are adequately inflated demonstrate a small left-sided pleural effusion with associated atelectasis. Minimal posterior right basilar atelectasis. Resolution of previously seen interstitial edema. Airways are within normal. Upper Abdomen: No acute abnormality. Musculoskeletal: Mild degenerate change of the spine. Review of the MIP images confirms the above findings. IMPRESSION: No evidence of pulmonary embolism. Evidence of recent CABG with small left effusion and associated left basilar atelectasis. Minimal dependent right basilar atelectasis. Moderate cardiomegaly. Electronically Signed   By: Elberta Fortis M.D.   On: 06/22/2017 16:40   Dg Chest Port 1 View  Result Date: 06/22/2017 CLINICAL DATA:  Syncopal episode today. Status post CABG 8 days ago. EXAM: PORTABLE CHEST 1 VIEW COMPARISON:  PA and lateral chest x-ray of June 18, 2017 FINDINGS: The right lung is well-expanded and clear. On the left there is persistent basilar atelectasis and small effusion. There is no pneumothorax. The cardiac silhouette is mildly enlarged. The pulmonary vascularity is not clearly engorged. The sternal wires are intact. There is calcification in the wall of the aortic arch. The observed bony structures exhibit no acute abnormalities. IMPRESSION: Persistent left basilar atelectasis and small left pleural effusion. Clear right lung. No overt CHF. No pneumothorax. Electronically Signed   By: David  Swaziland M.D.   On: 06/22/2017 12:35      Medications:     Scheduled Medications: . aspirin  325 mg Oral Daily  . carvedilol  6.25 mg Oral BID WC  . docusate sodium  100 mg Oral Daily  . enoxaparin (LOVENOX) injection  40 mg Subcutaneous Q24H  . fenofibrate  160 mg Oral  Daily  . furosemide  80 mg Intravenous BID  . gabapentin  300 mg Oral QHS  . losartan  25 mg Oral QHS  . potassium chloride SA  20 mEq Oral Daily  . sodium chloride flush  3 mL Intravenous Q12H  . zolpidem  10 mg Oral QHS     Infusions: . sodium chloride       PRN Medications:  sodium chloride, acetaminophen, bisacodyl, ondansetron (ZOFRAN) IV, oxyCODONE, sodium chloride flush    Patient Profile   [name] is a 62 y.o. male  with CAD s/p CABG x 4 06/14/17, HLD, HTN, chronic systolic CHF, CKD II, and urethral stricture s/p suprapubic catheterization.   He [facility] to [facility]/22/17 with near syncope following discharge from hospital 06/21/17 after recent CABG  Assessment/Plan   1. Syncope: Unclear cause.  - With low EF, ventricular arrhythmia is certainly a concern.  Also, with LBBB, progressive conduction disease with bradyarrhythmia is a possibility.  Certainly could have been a vagal event, but he had  no trigger (was not in pain, just sitting in urology office prior to his appt).  - Telemetry with NSVT this am. Suspect was VT.  - Will order Lifevest.  2. Acute on chronic systolic CHF: Ischemic cardiomyopathy.   - Echo during last admission with EF 20-25%.  Echo 06/22/17 EF up to 35-40%.   - Volume status improved. Will continue IV lasix 80 mg BID for today.  - Continue Coreg 6.25 mg bid.  - Continue losartan 25 mg daily, aim for possible transition to [facility] in the future based on BP. Too soft today.  - Would like him on spironolactone eventually. BP too soft today.  - Will order lifevest. Per pt, "pacemaker wire" was left in place in the event he needed and ICD in the future.  3. Unilateral leg swelling and dyspnea:  - CTA chest negative for PE.  - Venous dopplers negative for DVT.  4. CAD: S/p recent CABG.   - No s/s of ischemia.    - Continue ASA.  - Cannot tolerate statins or Zetia. Continue Repatha.  5. Urethral stricture with suprapubic catheter: -  Did not  see urologist yesterday because taken away by EMS after syncope.  - Have spoken to Urology who have said they will check in on him, but will have to be removed as outpatient.   Continue IV lasix today. Will order Lifevest.   Medication concerns reviewed with patient and pharmacy team. Barriers identified: None at this time.   Length of Stay: 1  Luane School  06/23/2017, 12:13 PM  Advanced Heart Failure Team Pager [phone] (M-F; 7a - 4p)  Please contact [facility] for night-coverage after hours (4p -7a ) and weekends on [email or web address]  Patient seen with PA, agree with the above note.   He diuresed well yesterday but still short of breath and orthopneic last night. K and creatinine stable.   CTA chest negative for PE and no DVT on venous dopplers.    BP too soft to titrate meds, hopefully can add low dose spironolactone tomorrow.  Will continue Lasix 80 mg IV bid today, probably back to po tomorrow.   He had a run of NSVT this morning, he was symptomatic with it (felt lightheaded), said it felt like what he felt with his syncopal episode yesterday but not as bad.  I am concerned that his syncope was arrhythmic.  EF is borderline this admission, 35-40%.  His CABG was 9 days ago.   - I will ask EP to see.  EP study + ICD if inducible may be the best course of action here.   Marca Ancona 06/23/2017 1:37 PM

## 2017-06-23 NOTE — Progress Notes (Signed)
[name] Combes, RN  Cc: Nestor Lewandowsky, RN        # 5. S/W BRITTANY @ [facility] # [phone]    ENTRESTO  24-26 MG BID  COVER- YES  CO-PAY-  100 % OF TOTAL COAST $ 610.94  TIER- NO  PRIOR APPROVAL- YES # [phone]  NONE PREFERRED  NO DEDUCTIBLE  OUT -OF POCKET : NOT MET   NO ALTERNATIVE   PREFERRED PHARMACY : YES [facility], [facility] AND [facility]

## 2017-06-23 NOTE — Consult Note (Addendum)
[address]       [place] [address]             [id number]        DENARIO BAGOT [facility] Medical Record #[id number] Date of Birth: 1955-09-19  Referring: No ref. provider found Primary Care: Jarome Matin, MD Primary Cardiologist:Peter Swaziland, MD  Chief Complaint:    Chief Complaint  Patient presents with  . Loss of Consciousness    History of Present Illness:     [name] is a 62 year old male known to our service due to his recent coronary bypass grafting x4 on 06/14/2017.  He has a past medical history significant for hypertension, hyperlipidemia, chronic systolic CHF, CKD stage II, and a urethral stricture status post suprapubic catheterization.  He was discharged home on 06/21/2017 in stable condition.  He had a follow-up appointment with urology on 06/22/2017.  While in the office he felt very lightheaded and fell to the floor.  The patient states that he did lose consciousness.  He is unsure of the length of time.  He did have slight chest pain ( 2/10 ).  He also states that he had some right foot pain which was sharp and located at the base of his last few lateral toes.  He was transported to the emergency department.  Due to his chest pain troponin levels were drawn which were not elevated.  He also underwent a chest CT which did not suggest a pulmonary embolism.  Heart failure is currently seeing him and working up this syncopal episode.  It could potentially be due to a tachyarrhythmia.  He did have a left bundle branch block both preoperatively and postoperatively.  He did not have any issues with his rhythm during his previous admission.  An echocardiogram was obtained which showed a 35 to 40% ejection fraction.  If the work-up for Mr. Ilic syncopal episode is nonconclusive then heart failure is recommending a LifeVest and a repeat echo in 3 months.   Current Activity/ Functional Status: Patient was independent with mobility/ambulation, transfers,  ADL's, IADL's.   Zubrod Score: At the time of surgery this patient's most appropriate activity status/level should be described as: []     0    Normal activity, no symptoms [x]     1    Restricted in physical strenuous activity but ambulatory, able to do out light work []     2    Ambulatory and capable of self care, unable to do work activities, up and about                 more than 50%  Of the time                            []     3    Only limited self care, in bed greater than 50% of waking hours []     4    Completely disabled, no self care, confined to bed or chair []     5    Moribund   Past Medical History:  Diagnosis Date  . Arthritis    "mild; back, neck, spine" (06/09/2017)  . CHF (congestive heart failure) (HCC)   . Coronary artery disease   . History of gout   . History of kidney stones X 2  . Hypercholesterolemia   . Hypertension   . LBBB (left bundle branch block)   . MI (myocardial  infarction) (HCC) 1993   LATERAL  . MI (myocardial infarction) (HCC) ?09/2001; 06/07/2017  . Pneumonia    "several times" (06/09/2017)  . S/P coronary artery stent placement    RCA  . Varicose veins     Past Surgical History:  Procedure Laterality Date  . ANTERIOR CERVICAL DECOMP/DISCECTOMY FUSION  03/03/2011   Procedure: ANTERIOR CERVICAL DECOMPRESSION/DISCECTOMY FUSION 3 LEVELS;  Surgeon: Karn Cassis, MD;  Location: [facility]URO ORS;  Service: Neurosurgery;  Laterality: N/A;  Cervical three-four Cervical four-five Cervical five-six Anterior cervical decompression/diskectomy, fusion  . BACK SURGERY    . CARDIAC CATHETERIZATION  2009   Stents in RCA patent. 70 to 80% PL, and 50% ostial PD.   Marland Kitchen CARDIAC CATHETERIZATION N/A 11/20/2014   Procedure: Left Heart Cath and Coronary Angiography;  Surgeon: Peter M Swaziland, MD;  Location: [facility] INVASIVE CV LAB;  Service: Cardiovascular;  Laterality: N/A;  . CORONARY ANGIOPLASTY     DIRECT ANGIOPLASTY THE MARGINAL BRANCH  . CORONARY ANGIOPLASTY WITH STENT  PLACEMENT     RCA  . CORONARY ARTERY BYPASS GRAFT N/A 06/14/2017   Procedure: CORONARY ARTERY BYPASS GRAFTING (CABG) x four, using left internal mammary artery and right leg greater saphenous vein harvested endoscopically;  Surgeon: Kerin Perna, MD;  Location: [facility] OR;  Service: Open Heart Surgery;  Laterality: N/A;  . CYSTOSCOPY N/A 06/14/2017   Procedure: CYSTOSCOPY;  Surgeon: Donata Clay, Theron Arista, MD;  Location: [facility] OR;  Service: Open Heart Surgery;  Laterality: N/A;  . INSERTION OF SUPRAPUBIC CATHETER N/A 06/14/2017   Procedure: INSERTION OF SUPRAPUBIC CATHETER - Lower abdomen;  Surgeon: Kerin Perna, MD;  Location: [facility] OR;  Service: Open Heart Surgery;  Laterality: N/A;  . POSTERIOR LUMBAR FUSION  10/2011  . RIGHT/LEFT HEART CATH AND CORONARY ANGIOGRAPHY N/A 06/08/2017   Procedure: RIGHT/LEFT HEART CATH AND CORONARY ANGIOGRAPHY;  Surgeon: Swaziland, Peter M, MD;  Location: [facility] INVASIVE CV LAB;  Service: Cardiovascular;  Laterality: N/A;  . TEE WITHOUT CARDIOVERSION N/A 06/14/2017   Procedure: TRANSESOPHAGEAL ECHOCARDIOGRAM (TEE);  Surgeon: Donata Clay, Theron Arista, MD;  Location: [facility] OR;  Service: Open Heart Surgery;  Laterality: N/A;    Social History   Tobacco Use  Smoking Status Former Smoker  . Packs/day: 2.00  . Years: 20.00  . Pack years: 40.00  . Types: Cigarettes  . Last attempt to quit: 05/07/1999  . Years since quitting: 18.1  Smokeless Tobacco Never Used    Social History   Substance and Sexual Activity  Alcohol Use Yes   Comment: 06/09/2017 "1 beer/month; at most"     Allergies  Allergen Reactions  . Ezetimibe Other (See Comments)    Joint pain and drops BP  . Niacin Other (See Comments)    Drops BP and severe  . Statins Other (See Comments)    Severe joint pain and drops BP.    Current Facility-Administered Medications  Medication Dose Route Frequency Provider Last Rate Last Dose  . 0.9 %  sodium chloride infusion  250 mL Intravenous PRN Graciella Freer, PA-C        . acetaminophen (TYLENOL) tablet 650 mg  650 mg Oral Q4H PRN Graciella Freer, PA-C      . aspirin EC tablet 325 mg  325 mg Oral Daily Graciella Freer, PA-C      . carvedilol (COREG) tablet 6.25 mg  6.25 mg Oral BID WC Laurey Morale, MD      . enoxaparin (LOVENOX) injection 40 mg  40 mg  Subcutaneous Q24H Graciella Freer, PA-C   40 mg at 06/22/17 2037  . fenofibrate tablet 160 mg  160 mg Oral Daily Graciella Freer, PA-C      . furosemide (LASIX) injection 80 mg  80 mg Intravenous BID Graciella Freer, PA-C   80 mg at 06/22/17 1523  . gabapentin (NEURONTIN) capsule 300 mg  300 mg Oral QHS Graciella Freer, PA-C   300 mg at 06/22/17 2036  . losartan (COZAAR) tablet 25 mg  25 mg Oral QHS Graciella Freer, PA-C   25 mg at 06/22/17 2036  . ondansetron (ZOFRAN) injection 4 mg  4 mg Intravenous Q6H PRN Graciella Freer, PA-C      . oxyCODONE (Oxy IR/ROXICODONE) immediate release tablet 5 mg  5 mg Oral Q4H PRN Graciella Freer, PA-C   5 mg at 06/22/17 2036  . potassium chloride SA (K-DUR,KLOR-CON) CR tablet 20 mEq  20 mEq Oral Daily Graciella Freer, PA-C      . sodium chloride flush (NS) 0.9 % injection 3 mL  3 mL Intravenous Q12H Graciella Freer, PA-C      . sodium chloride flush (NS) 0.9 % injection 3 mL  3 mL Intravenous PRN Graciella Freer, PA-C      . zolpidem (AMBIEN) tablet 10 mg  10 mg Oral QHS Graciella Freer, PA-C   10 mg at 06/22/17 2234    Medications Prior to Admission  Medication Sig Dispense Refill Last Dose  . aspirin 325 MG EC tablet Take 1 tablet (325 mg total) by mouth daily. 30 tablet 0 Past Month at Unknown time  . carvedilol (COREG) 6.25 MG tablet Take 1 tablet (6.25 mg total) by mouth 2 (two) times daily with a meal. 60 tablet 1 Past Month at Unknown time  . fenofibrate (TRICOR) 145 MG tablet TAKE 1 TABLET BY MOUTH EVERY DAY 90 tablet 3 Past Month at Unknown time  . furosemide  (LASIX) 40 MG tablet Take 1 tablet (40 mg total) by mouth daily. 3 tablet 0 Past Month at Unknown time  . gabapentin (NEURONTIN) 300 MG capsule Take 300 mg by mouth at bedtime.  2 Past Month at Unknown time  . lisinopril (PRINIVIL,ZESTRIL) 2.5 MG tablet Take 1 tablet (2.5 mg total) by mouth daily. 30 tablet 1 Past Month at Unknown time  . Multiple Vitamins-Minerals (MENS MULTIVITAMIN PLUS PO) Take 1 tablet by mouth 2 (two) times daily.   Past Month at Unknown time  . oxyCODONE (OXY IR/ROXICODONE) 5 MG immediate release tablet Take 1 tablet (5 mg total) by mouth every 4 (four) hours as needed for severe pain. 30 tablet 0 Past Month at Unknown time  . potassium chloride SA (K-DUR,KLOR-CON) 20 MEQ tablet Take 1 tablet (20 mEq total) by mouth daily. 3 tablet 0 Past Month at Unknown time  . zolpidem (AMBIEN) 10 MG tablet Take 10 mg by mouth at bedtime.   Past Month at Unknown time    Family History  Problem Relation Age of Onset  . Heart attack Father   . Diabetes Father   . Breast cancer Mother   . Coronary artery disease Mother   . Heart disease Brother   . Heart attack Maternal Grandfather      Review of Systems:   Review of Systems  HENT: Negative.   Respiratory: Positive for shortness of breath.   Cardiovascular: Positive for chest pain and leg swelling.  Gastrointestinal: Positive for constipation.  Neurological: Positive for dizziness.  Pertinent items are noted in HPI.     Physical Exam: BP 115/61 (BP Location: Right Arm)   Pulse 80   Temp 98.7 F (37.1 C) (Oral)   Resp 16   Ht  (1.88 m)   Wt 100.6 kg (221 lb 11.2 oz) Comment: scale C  SpO2 96%   BMI 28.46 kg/m    General appearance: alert, cooperative and no distress Resp: clear to auscultation bilaterally Cardio: regular rate and rhythm, S1, S2 normal, no murmur, click, rub or gallop GI: soft, non-tender; bowel sounds normal; no masses,  no organomegaly Extremities: right leg 1-2+ non-pitting pedal edema.  +Ecchymosis  Neurologic: Grossly normal  Diagnostic Studies & Laboratory data:     Recent Radiology Findings:   Ct Angio Chest Pe W/cm &/or Wo Cm  Result Date: 06/22/2017 CLINICAL DATA:  Syncope today.  CABG 3 weeks ago. EXAM: CT ANGIOGRAPHY CHEST WITH CONTRAST TECHNIQUE: Multidetector CT imaging of the chest was performed using the standard protocol during bolus administration of intravenous contrast. Multiplanar CT image reconstructions and MIPs were obtained to evaluate the vascular anatomy. CONTRAST:  ISOVUE-370 IOPAMIDOL (ISOVUE-370) INJECTION 76% COMPARISON:  06/07/2017 FINDINGS: Cardiovascular: Moderate cardiomegaly is present. There is calcified plaque over the left main and 3 vessel coronary arteries. Mild calcified plaque over the thoracic aorta. No evidence of pulmonary embolism. Mediastinum/Nodes: Evidence patient's recent median sternotomy and CABG. Few small patchy mediastinal lymph nodes and fluid compatible with recent postop state. Tiny amount of air over the left anterior lower mediastinum. Lungs/Pleura: Lungs are adequately inflated demonstrate a small left-sided pleural effusion with associated atelectasis. Minimal posterior right basilar atelectasis. Resolution of previously seen interstitial edema. Airways are within normal. Upper Abdomen: No acute abnormality. Musculoskeletal: Mild degenerate change of the spine. Review of the MIP images confirms the above findings. IMPRESSION: No evidence of pulmonary embolism. Evidence of recent CABG with small left effusion and associated left basilar atelectasis. Minimal dependent right basilar atelectasis. Moderate cardiomegaly. Electronically Signed   By: Elberta Fortis M.D.   On: 06/22/2017 16:40   Dg Chest Port 1 View  Result Date: 06/22/2017 CLINICAL DATA:  Syncopal episode today. Status post CABG 8 days ago. EXAM: PORTABLE CHEST 1 VIEW COMPARISON:  PA and lateral chest x-ray of June 18, 2017 FINDINGS: The right lung is well-expanded  and clear. On the left there is persistent basilar atelectasis and small effusion. There is no pneumothorax. The cardiac silhouette is mildly enlarged. The pulmonary vascularity is not clearly engorged. The sternal wires are intact. There is calcification in the wall of the aortic arch. The observed bony structures exhibit no acute abnormalities. IMPRESSION: Persistent left basilar atelectasis and small left pleural effusion. Clear right lung. No overt CHF. No pneumothorax. Electronically Signed   By: David  Swaziland M.D.   On: 06/22/2017 12:35     I have independently reviewed the above radiologic studies and discussed with the patient   Recent Lab Findings: Lab Results  Component Value Date   WBC 7.7 06/22/2017   HGB 8.2 (L) 06/22/2017   HCT 24.0 (L) 06/22/2017   PLT 312 06/22/2017   GLUCOSE 107 (H) 06/23/2017   CHOL 85 06/10/2017   TRIG 89 06/10/2017   HDL 42 06/10/2017   LDLCALC 25 06/10/2017   ALT 40 06/22/2017   AST 24 06/22/2017   NA 136 06/23/2017   K 3.7 06/23/2017   CL 103 06/23/2017   CREATININE 1.04 06/23/2017   BUN 26 (H) 06/23/2017   CO2 25 06/23/2017  TSH 3.983 06/10/2017   INR 1.28 06/14/2017   HGBA1C 5.9 (H) 06/10/2017      Assessment / Plan:   The patient is stable from a surgical standpoint. CT scan showed some atelectasis and a small left pleural effusion. Bony structures intact. Sternal incision is clean and dry. No erythema.  Sternum stable. Right EVH site incision is healing well without signs of infection. He does have some ecchymosis on the inside of his right thigh down to his incision. This will resolve overtime. Medical workup per HF.    Jari Favre, PA-C 06/23/2017 9:08 AM  patient examined and medical record reviewed,agree with above note. Kathlee Nations Trigt III 06/23/2017

## 2017-06-24 ENCOUNTER — Telehealth (HOSPITAL_COMMUNITY): Payer: Self-pay

## 2017-06-24 LAB — CBC
HCT: 29.1 % — ABNORMAL LOW (ref 39.0–52.0)
Hemoglobin: 9.1 g/dL — ABNORMAL LOW (ref 13.0–17.0)
MCH: 30.4 pg (ref 26.0–34.0)
MCHC: 31.3 g/dL (ref 30.0–36.0)
MCV: 97.3 fL (ref 78.0–100.0)
Platelets: 375 10*3/uL (ref 150–400)
RBC: 2.99 MIL/uL — ABNORMAL LOW (ref 4.22–5.81)
RDW: 14.9 % (ref 11.5–15.5)
WBC: 8 10*3/uL (ref 4.0–10.5)

## 2017-06-24 LAB — BASIC METABOLIC PANEL
Anion gap: 10 (ref 5–15)
BUN: 28 mg/dL — ABNORMAL HIGH (ref 6–20)
CO2: 29 mmol/L (ref 22–32)
Calcium: 8.8 mg/dL — ABNORMAL LOW (ref 8.9–10.3)
Chloride: 101 mmol/L (ref 101–111)
Creatinine, Ser: 1.22 mg/dL (ref 0.61–1.24)
GFR calc Af Amer: >60 mL/min (ref >60)
GFR calc non Af Amer: >60 mL/min (ref >60)
Glucose, Bld: 103 mg/dL — ABNORMAL HIGH (ref 65–99)
Potassium: 3.7 mmol/L (ref 3.5–5.1)
Sodium: 140 mmol/L (ref 135–145)

## 2017-06-24 LAB — ECHOCARDIOGRAM LIMITED
Height: 74 in
Weight: 3600 oz

## 2017-06-24 LAB — MAGNESIUM: Magnesium: 2.3 mg/dL (ref 1.7–2.4)

## 2017-06-24 MED ORDER — SPIRONOLACTONE 12.5 MG HALF TABLET: 12.5000 mg | ORAL_TABLET | Freq: Every day | ORAL | Status: DC

## 2017-06-24 MED ORDER — POTASSIUM CHLORIDE CRYS ER 20 MEQ PO TBCR
20.0000 meq | EXTENDED_RELEASE_TABLET | Freq: Once | ORAL | Status: AC
Start: 2017-06-24 — End: 2017-06-24
  Administered 2017-06-24: 20 meq via ORAL
  Filled 2017-06-24: qty 1

## 2017-06-24 MED ORDER — LOSARTAN POTASSIUM 25 MG PO TABS: 12.5000 mg | ORAL_TABLET | Freq: Every day | ORAL | Status: DC

## 2017-06-24 MED ORDER — FUROSEMIDE 40 MG PO TABS: 40.0000 mg | ORAL_TABLET | Freq: Every day | ORAL | Status: DC

## 2017-06-24 NOTE — Progress Notes (Signed)
Rec'd page that pt's BP remains low. Appears to have been an issue earlier today, requiring holding of IV Lasix. Losartan was decreased but BP is too low to administer thus will discontinue. Pt is asymptomatic per nurse. Wrote care order to please discuss AM meds with HF team before administering as further dose adjustment may be necessary. Blood count has trended down s/p CABG, likely postop losses but will order stat CBC to make sure now further downtrend. Nurse asked to page cardiology team when lab results for review.  Shaheim Mahar PA-C

## 2017-06-24 NOTE — Discharge Instructions (Signed)
Suprapubic Catheter Home Guide A suprapubic catheter is a rubber tube used to drain urine from the bladder into a collection bag. The catheter is inserted into the bladder through a small opening in the in the lower abdomen, near the center of the body, above the pubic bone (suprapubic area). There is a tiny balloon filled with germ-free (sterile) water on the end of the catheter that is in the bladder. The balloon helps to keep the catheter in place. Your suprapubic catheter may need to be replaced every 4-6 weeks, or as often as recommended by your health care provider. The collection bag must be emptied every day and cleaned every 2-3 days. The collection bag can be put beside your bed at night and attached to your leg during the day. You may have a large collection bag to use at night and a smaller one to use during the day.  How do I care for my skin around the catheter? Use a clean washcloth and soapy water to clean the skin around your catheter every day. Pat the area dry with a clean towel.  Do not pull on the catheter.  Do not use ointment or lotion on this area unless told by your health care provider.  Check your skin around the catheter every day for signs of infection. Check for: ? Redness, swelling, or pain. ? Fluid or blood. ? Warmth. ? Pus or a bad smell.  How do I clean and empty the collection bag? Clean the collection bag every 2-3 days, or as often as told by your health care provider. To do this, take the following steps:  Wash your hands with soap and water. If soap and water are not available, use hand sanitizer.  Disconnect the bag from the catheter and immediately attach a new bag to the catheter.  Empty the used bag completely.  Clean the used bag using one of the following methods: ? Rinse the used bag with warm water and soap. ? Fill the bag with water and add 1 tsp of vinegar. Let it sit for about 30 minutes, then empty the bag.  Let the bag dry completely,  and put it in a clean plastic bag before storing it.  Empty the large collection bag every 8 hours. Empty the small collection bag when it is about ? full. To empty your large or small collection bag, take the following steps:  Always keep the bag below the level of the catheter. This keeps urine from flowing backwards into the catheter.  Hold the bag over the toilet or another container. Turn the valve (spigot) at the bottom of the bag to empty the urine. ? Do not touch the opening of the spigot. ? Do not let the opening touch the toilet or container.  Close the spigot tightly when the bag is empty.  What are some general tips?  Always wash your hands before and after caring for your catheter and collection bag. Use a mild, fragrance-free soap. If soap and water are not available, use hand sanitizer.  Clean the catheter with soap and water as often as told by your health care provider.  Always make sure there are no twists or curls (kinks) in the catheter tube.  Always make sure there are no leaks in the catheter or collection bag.  Drink enough fluid to keep your urine clear or pale yellow.  Do not take baths, swim, or use a hot tub. When should I seek medical care? Seek medical  care if:  You leak urine.  You have redness, swelling, or pain around your catheter opening.  You have fluid or blood coming from your catheter opening.  Your catheter opening feels warm to the touch.  You have pus or a bad smell coming from your catheter opening.  You have a fever or chills.  Your urine flow slows down.  Your urine becomes cloudy or smelly.  When should I seek immediate medical care? Seek immediate medical care if your catheter comes out, or if you have:  Nausea.  Back pain.  Difficulty changing your catheter.  Blood in your urine.  No urine flow for 1 hour.  This information is not intended to replace advice given to you by your health care provider. Make sure you  discuss any questions you have with your health care provider. Document Released: 09/09/2010 Document Revised: 08/21/2015 Document Reviewed: 09/04/2014 Elsevier Interactive Patient Education  2018 [employer].

## 2017-06-24 NOTE — Progress Notes (Signed)
Paged cardiology about patients low BP. MD stated to hold evening dose of Losartan and ordered an CBC because of blood count trending down. MD asked to page cardiology with results of CBC.    Elsie Lincoln, RN

## 2017-06-24 NOTE — Consult Note (Signed)
Consultation: Suprapubic pain around suprapubic tube Requested by: Dr. Shirlee Latch    History of Present Illness: [name] underwent 4 vessel CABG and a Foley catheter could not be placed.  Dr. Ronne Binning performed cystoscopy but ultimately had to place a suprapubic tube due to urethral stricture.  Patient followed up in office for management of the SP tube but had a syncopal episode and back in hospital.  He's had some pain around the suprapubic tube site.  Urology consulted.  His white count a couple of days ago was 7.7.  There is been no drainage from around the tube.  The tube seems to be draining well.  Urine clear.  Kidney function normal. His bowels are functioning normally. Of note, the patient has voided some urine per urethra since the suprapubic tube is in place.  Past Medical History:  Diagnosis Date  . Arthritis    "mild; back, neck, spine" (06/09/2017)  . CHF (congestive heart failure) (HCC)   . Coronary artery disease   . History of gout   . History of kidney stones X 2  . Hypercholesterolemia   . Hypertension   . LBBB (left bundle branch block)   . MI (myocardial infarction) (HCC) 1993   LATERAL  . MI (myocardial infarction) (HCC) ?09/2001; 06/07/2017  . Pneumonia    "several times" (06/09/2017)  . S/P coronary artery stent placement    RCA  . Varicose veins    Past Surgical History:  Procedure Laterality Date  . ANTERIOR CERVICAL DECOMP/DISCECTOMY FUSION  03/03/2011   Procedure: ANTERIOR CERVICAL DECOMPRESSION/DISCECTOMY FUSION 3 LEVELS;  Surgeon: Karn Cassis, MD;  Location: [facility];  Service: Neurosurgery;  Laterality: N/A;  Cervical three-four Cervical four-five Cervical five-six Anterior cervical decompression/diskectomy, fusion  . BACK SURGERY    . CARDIAC CATHETERIZATION  2009   Stents in RCA patent. 70 to 80% PL, and 50% ostial PD.   Marland Kitchen CARDIAC CATHETERIZATION N/A 11/20/2014   Procedure: Left Heart Cath and Coronary Angiography;  Surgeon: Peter M Swaziland, MD;   Location: [facility] INVASIVE CV LAB;  Service: Cardiovascular;  Laterality: N/A;  . CORONARY ANGIOPLASTY     DIRECT ANGIOPLASTY THE MARGINAL BRANCH  . CORONARY ANGIOPLASTY WITH STENT PLACEMENT     RCA  . CORONARY ARTERY BYPASS GRAFT N/A 06/14/2017   Procedure: CORONARY ARTERY BYPASS GRAFTING (CABG) x four, using left internal mammary artery and right leg greater saphenous vein harvested endoscopically;  Surgeon: Kerin Perna, MD;  Location: [facility] OR;  Service: Open Heart Surgery;  Laterality: N/A;  . CYSTOSCOPY N/A 06/14/2017   Procedure: CYSTOSCOPY;  Surgeon: Donata Clay, Theron Arista, MD;  Location: [facility] OR;  Service: Open Heart Surgery;  Laterality: N/A;  . INSERTION OF SUPRAPUBIC CATHETER N/A 06/14/2017   Procedure: INSERTION OF SUPRAPUBIC CATHETER - Lower abdomen;  Surgeon: Kerin Perna, MD;  Location: [facility] OR;  Service: Open Heart Surgery;  Laterality: N/A;  . POSTERIOR LUMBAR FUSION  10/2011  . RIGHT/LEFT HEART CATH AND CORONARY ANGIOGRAPHY N/A 06/08/2017   Procedure: RIGHT/LEFT HEART CATH AND CORONARY ANGIOGRAPHY;  Surgeon: Swaziland, Peter M, MD;  Location: [facility] INVASIVE CV LAB;  Service: Cardiovascular;  Laterality: N/A;  . TEE WITHOUT CARDIOVERSION N/A 06/14/2017   Procedure: TRANSESOPHAGEAL ECHOCARDIOGRAM (TEE);  Surgeon: Donata Clay, Theron Arista, MD;  Location: [facility] OR;  Service: Open Heart Surgery;  Laterality: N/A;    Home Medications:  Medications Prior to Admission  Medication Sig Dispense Refill Last Dose  . aspirin 325 MG EC tablet Take 1 tablet (325  mg total) by mouth daily. 30 tablet 0 Past Month at Unknown time  . carvedilol (COREG) 6.25 MG tablet Take 1 tablet (6.25 mg total) by mouth 2 (two) times daily with a meal. 60 tablet 1 Past Month at Unknown time  . Evolocumab (REPATHA SURECLICK) 140 MG/ML SOAJ Inject 140 mg into the skin every 14 (fourteen) days.    06/18/2017  . fenofibrate (TRICOR) 145 MG tablet TAKE 1 TABLET BY MOUTH EVERY DAY 90 tablet 3 Past Month at Unknown time  . furosemide (LASIX) 40 MG  tablet Take 1 tablet (40 mg total) by mouth daily. 3 tablet 0 Past Month at Unknown time  . gabapentin (NEURONTIN) 300 MG capsule Take 300 mg by mouth at bedtime.  2 Past Month at Unknown time  . lisinopril (PRINIVIL,ZESTRIL) 2.5 MG tablet Take 1 tablet (2.5 mg total) by mouth daily. 30 tablet 1 Past Month at Unknown time  . Multiple Vitamins-Minerals (MENS MULTIVITAMIN PLUS PO) Take 1 tablet by mouth 2 (two) times daily.   Past Month at Unknown time  . oxyCODONE (OXY IR/ROXICODONE) 5 MG immediate release tablet Take 1 tablet (5 mg total) by mouth every 4 (four) hours as needed for severe pain. 30 tablet 0 Past Month at Unknown time  . potassium chloride SA (K-DUR,KLOR-CON) 20 MEQ tablet Take 1 tablet (20 mEq total) by mouth daily. 3 tablet 0 Past Month at Unknown time  . zolpidem (AMBIEN) 10 MG tablet Take 10 mg by mouth at bedtime.   Past Month at Unknown time   Allergies:  Allergies  Allergen Reactions  . Ezetimibe Other (See Comments)    Joint pain and drops BP  . Niacin Other (See Comments)    Drops BP and severe  . Statins Other (See Comments)    Severe joint pain and drops BP.    Family History  Problem Relation Age of Onset  . Heart attack Father   . Diabetes Father   . Breast cancer Mother   . Coronary artery disease Mother   . Heart disease Brother   . Heart attack Maternal Grandfather    Social History:  reports that he quit smoking about 18 years ago. His smoking use included cigarettes. He has a 40.00 pack-year smoking history. He has never used smokeless tobacco. He reports that he drinks alcohol. He reports that he does not use drugs.  ROS: A complete review of systems was performed.  All systems are negative except for pertinent findings as noted. ROS   Physical Exam:  Vital signs in last 24 hours: Temp:  [98 F (36.7 C)-98.1 F (36.7 C)] 98 F (36.7 C) (06/20 1921) Pulse Rate:  [72-81] 73 (06/20 1921) Resp:  [18] 18 (06/20 1130) BP: (83-126)/(48-64) 87/55  (06/20 1935) SpO2:  [93 %-97 %] 97 % (06/20 1921) Weight:  [98.4 kg (217 lb)] 98.4 kg (217 lb) (06/20 0333) General:  Alert and oriented, No acute distress HEENT: Normocephalic, atraumatic Lungs: Regular rate and effort Abdomen: Soft, nontender, nondistended, no abdominal masses, Suprapubic tube appears to be in good position.  Urine is clear.  Tube was stitched in and there is some dried blood around it.  No drainage from around the tube.  No induration or fluctuance.  No crepitus.  Some mild erythema consistent with healing of the suprapubic tube tract. Back: No CVA tenderness Extremities: No edema Neurologic: Grossly intact  Laboratory Data:  Results for orders placed or performed during the hospital encounter of 06/22/17 (from the past 24 hour(s))  Basic metabolic panel     Status: Abnormal   Collection Time: 06/24/17  4:41 AM  Result Value Ref Range   Sodium 140 135 - 145 mmol/L   Potassium 3.7 3.5 - 5.1 mmol/L   Chloride 101 101 - 111 mmol/L   CO2 29 22 - 32 mmol/L   Glucose, Bld 103 (H) 65 - 99 mg/dL   BUN 28 (H) 6 - 20 mg/dL   Creatinine, Ser 2.57 0.61 - 1.24 mg/dL   Calcium 8.8 (L) 8.9 - 10.3 mg/dL   GFR calc non Af Amer >60 >60 mL/min   GFR calc Af Amer >60 >60 mL/min   Anion gap 10 5 - 15  Magnesium     Status: None   Collection Time: 06/24/17  4:41 AM  Result Value Ref Range   Magnesium 2.3 1.7 - 2.4 mg/dL   No results found for this or any previous visit (from the past 240 hour(s)). Creatinine: Recent Labs    06/18/17 0604 06/19/17 0553 06/22/17 1216 06/22/17 1246 06/23/17 0532 06/24/17 0441  CREATININE 1.05 1.02 1.04 1.00 1.04 1.22    Impression/Assessment/plan:  SP pain around SP tube -- the SP tube appears to be in good position and functioning normally.  No sign of any bowel injury on history, exam or his labs.  I'll notify Dr. Thea Silversmith of the patient's admission as the patient and his wife are anxious to know the plan.  I told them we typically leave  the suprapubic tube in for about a month before the first change, so the tube is okay to stay in for a few more weeks.  Jerilee Field 06/24/2017, 9:33 PM

## 2017-06-24 NOTE — Progress Notes (Signed)
Paged cardiology with results of CBC.  Elsie Lincoln, RN

## 2017-06-24 NOTE — Progress Notes (Signed)
Patient BP dropped to 88/48.  MD informed.  MD to adjust medications and I will recheck BP in about 1 hour.

## 2017-06-24 NOTE — Progress Notes (Addendum)
Advanced Heart Failure Rounding Note  PCP-Cardiologist: Peter Swaziland, MD   Subjective:    CTA chest 06/22/17 negative for PE.  Venous dopplers 06/23/17 negative for DVT.   Seen by EP 06/23/17. Thought NSVT vs VT with his syncope. They recommend Lifevest for now.   Feeling good this am. Denies CP, lightheadedness or dizziness.  Mild SOB walking last night, but OK this am.    Weight down another 4 lbs. Creatinine stable at 1.22. L 3.7.   Objective:   Weight Range: 217 lb (98.4 kg) Body mass index is 27.86 kg/m.   Vital Signs:   Temp:  [98 F (36.7 C)-98.7 F (37.1 C)] 98 F (36.7 C) (06/20 0333) Pulse Rate:  [72-87] 72 (06/20 0816) Resp:  [16-18] 18 (06/20 0333) BP: (91-126)/(51-69) 116/56 (06/20 0816) SpO2:  [91 %-96 %] 96 % (06/20 0816) Weight:  [217 lb (98.4 kg)] 217 lb (98.4 kg) (06/20 0333) Last BM Date: 06/24/17  Weight change: Filed Weights   06/22/17 1632 06/23/17 0438 06/24/17 0333  Weight: 224 lb 9.6 oz (101.9 kg) 221 lb 11.2 oz (100.6 kg) 217 lb (98.4 kg)    Intake/Output:   Intake/Output Summary (Last 24 hours) at 06/24/2017 0901 Last data filed at 06/24/2017 0813 Gross per 24 hour  Intake 726 ml  Output 3650 ml  Net -2924 ml    Physical Exam    General: Well appearing. No resp difficulty. HEENT: Normal Neck: Supple. JVP 8 cm. Carotids 2+ bilat; no bruits. No thyromegaly or nodule noted. Cor: PMI nondisplaced. RRR, No M/G/R noted Lungs: CTAB, normal effort. Abdomen: Soft, non-tender, non-distended, no HSM. No bruits or masses. +BS  Extremities: No cyanosis, clubbing, or rash. R and LLE no edema.  Neuro: Alert & orientedx3, cranial nerves grossly intact. moves all 4 extremities w/o difficulty. Affect pleasant   Telemetry   NSR 70-80s, personally reviewed.   EKG    No new tracings.    Labs    CBC Recent Labs    06/22/17 1216 06/22/17 1246  WBC 7.7  --   NEUTROABS 5.8  --   HGB 8.5* 8.2*  HCT 28.0* 24.0*  MCV 100.4*  --   PLT  312  --    Basic Metabolic Panel Recent Labs    06/30/92 0532 06/24/17 0441  NA 136 140  K 3.7 3.7  CL 103 101  CO2 25 29  GLUCOSE 107* 103*  BUN 26* 28*  CREATININE 1.04 1.22  CALCIUM 8.6* 8.8*  MG 2.2 2.3   Liver Function Tests Recent Labs    06/22/17 1216  AST 24  ALT 40  ALKPHOS 61  BILITOT 1.6*  PROT 5.2*  ALBUMIN 2.7*   No results for input(s): LIPASE, AMYLASE in the last 72 hours. Cardiac Enzymes No results for input(s): CKTOTAL, CKMB, CKMBINDEX, TROPONINI in the last 72 hours.  BNP: BNP (last 3 results) Recent Labs    06/07/17 1733  BNP 177.1*   ProBNP (last 3 results) No results for input(s): PROBNP in the last 8760 hours.  D-Dimer No results for input(s): DDIMER in the last 72 hours. Hemoglobin A1C No results for input(s): HGBA1C in the last 72 hours. Fasting Lipid Panel No results for input(s): CHOL, HDL, LDLCALC, TRIG, CHOLHDL, LDLDIRECT in the last 72 hours. Thyroid Function Tests No results for input(s): TSH, T4TOTAL, T3FREE, THYROIDAB in the last 72 hours.  Invalid input(s): FREET3  Other results:  Imaging   No results found.  Medications:  Scheduled Medications: . aspirin  325 mg Oral Daily  . carvedilol  6.25 mg Oral BID WC  . docusate sodium  100 mg Oral Daily  . enoxaparin (LOVENOX) injection  40 mg Subcutaneous Q24H  . fenofibrate  160 mg Oral Daily  . furosemide  80 mg Intravenous BID  . gabapentin  300 mg Oral QHS  . losartan  25 mg Oral QHS  . potassium chloride SA  20 mEq Oral Daily  . sodium chloride flush  3 mL Intravenous Q12H  . zolpidem  10 mg Oral QHS    Infusions: . sodium chloride      PRN Medications: sodium chloride, acetaminophen, bisacodyl, ondansetron (ZOFRAN) IV, oxyCODONE, sodium chloride flush  Patient Profile   [name] is a 62 y.o. male  with CAD s/p CABG x 4 06/14/17, HLD, HTN, chronic systolic CHF, CKD II, and urethral stricture s/p suprapubic catheterization.   He presented to  [facility]/22/17 with near syncope following discharge from hospital 06/21/17 after recent CABG  Assessment/Plan   1. Syncope: Unclear cause.  - With low EF, ventricular arrhythmia is certainly a concern.  Also, with LBBB, progressive conduction disease with bradyarrhythmia is a possibility.  Certainly could have been a vagal event, but he had no trigger (was not in pain, just sitting in urology office prior to his appt).  - Telemetry with nonsustained WCT yesterday, has baseline LBBB and did not appear to change axis. Unclear if SVT with aberrancy or VT.   - Have ordered Lifevest. Working on approval.  2. Acute on chronic systolic CHF: Ischemic cardiomyopathy.   - Echo during last admission with EF 20-25%.  Echo 06/22/17 EF up to 30-35%.   - Volume status much improved. Down  - Stop IV lasix. Transition to lasix 40 mg bid for tomorrow.  - Continue Coreg 6.25 mg bid.  - Continue losartan 25 mg daily, aim for possible transition to [facility] in the future based on BP. Too soft today.  - Will start on spironolactone 12.5 mg qhs.  - Lifevest ordered.  3. Unilateral leg swelling and dyspnea:  - CTA chest negative for PE.  - Venous dopplers negative for DVT.  4. CAD: S/p recent CABG.   - No s/s of ischemia.    - Continue ASA.  - Cannot tolerate statins or Zetia. Continue Repatha.  5. Urethral stricture with suprapubic catheter: - Have spoken to Urology who have said they will try and check in on him, but will have to be removed as outpatient.   Volume status much improved. Working on Ship broker.  Medication concerns reviewed with patient and pharmacy team. Barriers identified: None at this time.   Length of Stay: 2  Luane School  06/24/2017, 9:01 AM  Advanced Heart Failure Team Pager [phone] (M-F; 7a - 4p)  Please contact [facility] for night-coverage after hours (4p -7a ) and weekends on [email or web address]  Patient seen with PA, agree with the above note.   On  exam, JVP 8-9 cm with clear lungs.  1+ edema right ankle.    No further WCT.   Hold IV Lasix after this am's dose, will start Lasix 40 mg po bid tomorrow.   BP soft and felt lightheaded with SBP in 80s this morning.  Decrease losartan to 12.5 mg qhs and hold off on spironolactone for now.   Discussed situation with EP.  WCT yesterday did not show axis change compared to his sinus rhythm (has baseline LBBB).  May have been SVT with aberrancy, not definitively nonsustained VT.   - I reviewed his echo personally, EF is 30-35%.  We will plan to place Lifevest.  He will have repeat echo 3 months post-CABG, if EF remains low, he will need CRT-D (LBBB).   Need urology evaluation, suprapubic site painful.   Most likely home tomorrow.   Marca Ancona 06/24/2017 1:52 PM

## 2017-06-24 NOTE — Care Management Note (Addendum)
Case Management Note Previous Note Created by  Donn Pierini RN,BSN Unit [facility] 1-22 Case Manager  [phone]  Patient Details  Name: [name] MRN: [id number] Date of Birth: Apr 03, 1955  Subjective/Objective:  Pt admitted with NSTEMI and HTN urgency.  Cath showed severe 3VD- pt s/p CABGx4 on 06/14/17                  Action/Plan: PTA pt lived at home with spouse, anticipate return home. CM to follow for transition of care needs  Expected Discharge Date:                  Expected Discharge Plan:     In-House Referral:     Discharge planning Services     Post Acute Care Choice:    Choice offered to:     DME Arranged:    DME Agency:     HH Arranged:    HH Agency:     Status of Service:     If discussed at [employer] of Stay Meetings, dates discussed:    Discharge Disposition:   Additional Comments: 06/24/2017 Life vest form faxed - [employer] rep working on approval.  HF team aware that Sherryll Burger will require prior Lyman Speller, RN 06/24/2017, 10:57 AM

## 2017-06-24 NOTE — Progress Notes (Signed)
  Paged for BP of 88/48, pt mildly lightheaded.   IV Lasix stopped this am. Telemetry reviewed and no associated arrythmia.  Will cut losartan back to 12.5 mg qhs.   Working on Market researcher.     Casimiro Needle "Otilio Saber, PA-C 06/24/2017 1:40 PM

## 2017-06-24 NOTE — Telephone Encounter (Signed)
Pt insurance is active and benefits verified through [place]. Co-pay $0.00, DED $2,500.00/$2,500.00 met, out of pocket $5,000.00/$3,310.26 met, co-insurance 0%. No pre-authorization required. Passport - 06/23/17 [id number]  2ndary insurance:  [employer] is active and benefits verified through [place]. Co-pay $0.00, DED $3,200.00/$3,200.00 met, out of pocket $5,000.00/$3,622.73 met, co-insurance 10%. No pre-authorization required. Passport - 06/23/17 @ 4:14pm, [id number]

## 2017-06-24 NOTE — Progress Notes (Signed)
  Paged urology for follow up concerning his suprapubic catheter.   Spoke with Dr. Mena Goes.   He states with a new suprapubic catheter it is "normal" to have redness and tenderness at the site.  He states there is "no need" for anyone to come out and see him unless he is having other difficult such as poor output or obvious signs of infection.   I expressed patients frustration and our desire to consult with Urology. Was again told if redness around site and tenderness at site were only issues, no consult was needed.   All above reviewed with Dr. Shirlee Latch and family. It is unclear if outpatient follow up has been made, and Urologist ended call due to other matters, so will place contact information on patients AVS for discharge.   Casimiro Needle [address] [place], PA-C 06/24/2017 2:04 PM

## 2017-06-25 LAB — BASIC METABOLIC PANEL
Anion gap: 5 (ref 5–15)
BUN: 23 mg/dL — ABNORMAL HIGH (ref 6–20)
CO2: 28 mmol/L (ref 22–32)
Calcium: 8.8 mg/dL — ABNORMAL LOW (ref 8.9–10.3)
Chloride: 102 mmol/L (ref 101–111)
Creatinine, Ser: 1 mg/dL (ref 0.61–1.24)
GFR calc Af Amer: >60 mL/min (ref >60)
GFR calc non Af Amer: >60 mL/min (ref >60)
Glucose, Bld: 110 mg/dL — ABNORMAL HIGH (ref 65–99)
Potassium: 4.1 mmol/L (ref 3.5–5.1)
Sodium: 135 mmol/L (ref 135–145)

## 2017-06-25 MED ORDER — FUROSEMIDE 40 MG PO TABS
40.0000 mg | ORAL_TABLET | Freq: Two times a day (BID) | ORAL | Status: DC
  Administered 2017-06-25: 40 mg via ORAL
  Filled 2017-06-25 (×2): qty 1

## 2017-06-25 MED ORDER — DOCUSATE SODIUM 100 MG PO CAPS: 100.0000 mg | ORAL_CAPSULE | Freq: Every day | ORAL | 0 refills | Status: DC | PRN

## 2017-06-25 MED ORDER — LOSARTAN POTASSIUM 25 MG PO TABS: 12.5000 mg | ORAL_TABLET | Freq: Every day | ORAL | 6 refills | Status: DC

## 2017-06-25 MED ORDER — LOSARTAN POTASSIUM 25 MG PO TABS: 12.5000 mg | ORAL_TABLET | Freq: Every day | ORAL | Status: DC

## 2017-06-25 MED ORDER — FUROSEMIDE 40 MG PO TABS: 40.0000 mg | ORAL_TABLET | Freq: Two times a day (BID) | ORAL | 6 refills | Status: DC

## 2017-06-25 MED ORDER — FUROSEMIDE 40 MG PO TABS: 40.0000 mg | ORAL_TABLET | Freq: Every day | ORAL | 6 refills | Status: DC

## 2017-06-25 MED ORDER — BISACODYL 5 MG PO TBEC: 5.0000 mg | DELAYED_RELEASE_TABLET | Freq: Every day | ORAL | 0 refills | Status: DC | PRN

## 2017-06-25 NOTE — Progress Notes (Signed)
Spoke with Rosanne Ashing of [employer] and he estimates that he will be by to fit patient around 4pm today.

## 2017-06-25 NOTE — Progress Notes (Addendum)
Advanced Heart Failure Rounding Note  PCP-Cardiologist: Peter Swaziland, MD   Subjective:    CTA chest 06/22/17 negative for PE.  Venous dopplers 06/23/17 negative for DVT.   Seen by EP 06/23/17. Thought NSVT vs VT with his syncope. They recommend Lifevest for now.   Losartan stopped with soft BPs last night. CBC checked and hemoglobin improved to 9.1.  Urology consulted yesterday for tenderness and redness around suprapubic catheter. Appears to be normal per their note.   Weight down 1 lb overnight. Creatinine 1.0. K 4.1.   Denies CP, SOB. Ambulating in room without difficulty. Continues to have pain at suprapubic catheter site, but feels more comfortable with urology having looked at it.  Mild lightheadedness with soft BPs throughout the night.   Objective:   Weight Range: 216 lb (98 kg) Body mass index is 27.73 kg/m.   Vital Signs:   Temp:  [98 F (36.7 C)-98.1 F (36.7 C)] 98 F (36.7 C) (06/21 0420) Pulse Rate:  [72-74] 74 (06/21 0420) Resp:  [18] 18 (06/20 1130) BP: (83-116)/(48-67) 107/67 (06/21 0420) SpO2:  [96 %-99 %] 96 % (06/21 0420) Weight:  [216 lb (98 kg)] 216 lb (98 kg) (06/21 0420) Last BM Date: 06/24/17  Weight change: Filed Weights   06/23/17 0438 06/24/17 0333 06/25/17 0420  Weight: 221 lb 11.2 oz (100.6 kg) 217 lb (98.4 kg) 216 lb (98 kg)    Intake/Output:   Intake/Output Summary (Last 24 hours) at 06/25/2017 0736 Last data filed at 06/25/2017 0734 Gross per 24 hour  Intake 1083 ml  Output 2700 ml  Net -1617 ml    Physical Exam    General: Well appearing. No resp difficulty. HEENT: Normal Neck: Supple. JVP ~7. Carotids 2+ bilat; no bruits. No thyromegaly or nodule noted. Cor: PMI nondisplaced. RRR, No M/G/R noted Lungs: CTAB, normal effort. Abdomen: Soft, non-tender, non-distended, no HSM. No bruits or masses. +BS  Extremities: No cyanosis, clubbing, or rash. R and LLE no edema.  Neuro: Alert & orientedx3, cranial nerves grossly intact.  moves all 4 extremities w/o difficulty. Affect pleasant   Telemetry   SR 70s with IVCD (120 ms).  Personally reviewed.   EKG    No new tracings.    Labs    CBC Recent Labs    06/22/17 1216 06/22/17 1246 06/24/17 2124  WBC 7.7  --  8.0  NEUTROABS 5.8  --   --   HGB 8.5* 8.2* 9.1*  HCT 28.0* 24.0* 29.1*  MCV 100.4*  --  97.3  PLT 312  --  375   Basic Metabolic Panel Recent Labs    67/54/49 0532 06/24/17 0441  NA 136 140  K 3.7 3.7  CL 103 101  CO2 25 29  GLUCOSE 107* 103*  BUN 26* 28*  CREATININE 1.04 1.22  CALCIUM 8.6* 8.8*  MG 2.2 2.3   Liver Function Tests Recent Labs    06/22/17 1216  AST 24  ALT 40  ALKPHOS 61  BILITOT 1.6*  PROT 5.2*  ALBUMIN 2.7*   No results for input(s): LIPASE, AMYLASE in the last 72 hours. Cardiac Enzymes No results for input(s): CKTOTAL, CKMB, CKMBINDEX, TROPONINI in the last 72 hours.  BNP: BNP (last 3 results) Recent Labs    06/07/17 1733  BNP 177.1*   ProBNP (last 3 results) No results for input(s): PROBNP in the last 8760 hours.  D-Dimer No results for input(s): DDIMER in the last 72 hours. Hemoglobin A1C No results for input(s): HGBA1C  in the last 72 hours. Fasting Lipid Panel No results for input(s): CHOL, HDL, LDLCALC, TRIG, CHOLHDL, LDLDIRECT in the last 72 hours. Thyroid Function Tests No results for input(s): TSH, T4TOTAL, T3FREE, THYROIDAB in the last 72 hours.  Invalid input(s): FREET3  Other results:  Imaging   No results found.  Medications:     Scheduled Medications: . aspirin  325 mg Oral Daily  . carvedilol  6.25 mg Oral BID WC  . docusate sodium  100 mg Oral Daily  . enoxaparin (LOVENOX) injection  40 mg Subcutaneous Q24H  . fenofibrate  160 mg Oral Daily  . furosemide  40 mg Oral Daily  . gabapentin  300 mg Oral QHS  . potassium chloride SA  20 mEq Oral Daily  . sodium chloride flush  3 mL Intravenous Q12H  . zolpidem  10 mg Oral QHS    Infusions: . sodium chloride       PRN Medications: sodium chloride, acetaminophen, bisacodyl, ondansetron (ZOFRAN) IV, oxyCODONE, sodium chloride flush  Patient Profile   [name] is a 62 y.o. male  with CAD s/p CABG x 4 06/14/17, HLD, HTN, chronic systolic CHF, CKD II, and urethral stricture s/p suprapubic catheterization.   He [facility] to [facility]17 with near syncope following discharge from hospital 06/21/17 after recent CABG  Assessment/Plan   1. Syncope: Unclear cause.  - With low EF, ventricular arrhythmia is certainly a concern.  Also, with LBBB, progressive conduction disease with bradyarrhythmia is a possibility.  Certainly could have been a vagal event, but he had no trigger (was not in pain, just sitting in urology office prior to his appt).  - Telemetry with nonsustained WCT yesterday, has baseline LBBB and did not appear to change axis. Unclear if SVT with aberrancy or VT.   - Have ordered Lifevest. Working on approval. No change.  2. Acute on chronic systolic CHF: Ischemic cardiomyopathy.   - Echo during last admission with EF 20-25%.  Echo 06/22/17 EF up to 30-35%.   - Volume status much improved.  - Started on PO lasix 40 mg BID for today - Continue Coreg 6.25 mg bid.  - Losartan stopped yesterday with symptomatic hypotension (SBP 80s). SBP 100-110s this am. Restart losartan 12.5 mg qHS.  Cleda Daub not started due to hypotension. Will hold off for now.  - Lifevest ordered. Was approved and should be delivered this afternoon.   - Will need echo in 3 months 3. Unilateral leg swelling and dyspnea:  - CTA chest negative for PE.  - Venous dopplers negative for DVT. No change.  4. CAD: S/p recent CABG.   - No s/s ischemia - Continue ASA.  - Cannot tolerate statins or Zetia. Continue Repatha.  5. Urethral stricture with suprapubic catheter: - Urology consulted yesterday for pain and redness at catheter site. Suprapubic catheter functioning properly with no s/s infection. Will leave in for now and  remove outpatient.   Medication concerns reviewed with patient and pharmacy team. Barriers identified: None at this time.   Awaiting LifeVest delivery Will arrange HF follow up DC today.   Length of Stay: 3  Alford Highland, NP  06/25/2017, 7:36 AM  Advanced Heart Failure Team Pager [phone] (M-F; 7a - 4p)  Please contact [facility] for night-coverage after hours (4p -7a ) and weekends on [email or web address]  Patient seen with NP, agree with the above note. He is breathing better after diuresis. Weight down 9 lbs.  He looks euvolemic on exam.  No  further arrhythmias.   May go home today after Lifevest is arranged.   Meds for home:  Coreg 6.25 mg bid Losartan 12.5 daily Repatha ASA 325 Fenofibrate 160 Lasix 40 mg po bid KCl 20 daily  Needs followup 10 days CHF clinic  Marca Ancona 06/25/2017 1:37 PM

## 2017-06-25 NOTE — Discharge Summary (Addendum)
Advanced Heart Failure Discharge Note  Discharge Summary   Patient ID: [name] MRN: [id number], DOB/AGE: 63-May-1957 63 y.o. Admit date: 06/22/2017 D/C date:     06/25/2017   Primary Discharge Diagnoses:  1. Syncope - Discharged with LifeVest 2. A/C systolic HF due to ICM 3. Unilateral leg swelling and dyspnea 4. CAD s/p recent CABG 5. Urethral stricture with suprapubic catheter  Hospital Course: [name] is a 62 y.o. male with CAD s/p CABG x 4 06/14/17, HLD, HTN, chronic systolic CHF, CKD II, and urethral stricture s/p suprapubic catheterization. He was sent to [facility] after syncopal episode of LOC at urology office. CTA negative for PE. Diuresed with IV lasix, then transitioned to lasix 40 mg BID. HF medications were optimized. Unable to add spiro due to soft BPs. Problem based description of admission below.   1. Syncope: Unclear cause.  - With low EF, ventricular arrhythmia is certainly a concern. Also, with LBBB, progressive conduction disease with bradyarrhythmia is a possibility. Certainly could have been a vagal event, but he had no trigger (was not in pain, just sitting in urology office prior to his appt).  - Telemetry with nonsustained WCT yesterday, has baseline LBBB and did not appear to change axis. Unclear if SVT with aberrancy or VT.   - Discharged with LifeVest.  2. Acute on chronic systolic CHF: Ischemic cardiomyopathy.  - Echo during last admission with EF 20-25%. Echo 06/22/17 EF up to 30-35%.  - Volume status much improved with IV diuresis. - Mildly orthostatic on day of discharge with associated lightheadedness. He will hold lasix tomorrow and then have him resume lasix 40 mg daily on 6/23. - Continue Coreg 6.25 mg bid.  - Losartan stopped prior to discharge with lightheadedness and soft blood pressures.  Cleda Daub not started due to hypotension. - Discharged with LifeVest - Will need echo in 3 months. If EF remains low, will need CRT-D (LBBB) 3.  Unilateral leg swelling and dyspnea:  - CTA chest negative for PE.  - Venous dopplers negative for DVT.  4. CAD: S/p recent CABG.  - No s/s ischemia - Continue ASA.  - Cannot tolerate statins or Zetia. Continue Repatha.  5. Urethral stricture with suprapubic catheter: - Urology consulted yesterday for pain and redness at catheter site. Suprapubic catheter functioning properly with no s/s infection. Will leave in for now and remove outpatient. Urology contact info given to patient on AVS.  He will be followed closely in HF clinic, with appointment as below. Discharged with LifeVest. He will need to follow up with urology for further management of suprapubic catheter (contact info on AVS).   Discharge Weight Range: 216 lbs Discharge Vitals: Blood pressure 94/63, pulse 73, temperature 98.2 F (36.8 C), temperature source Oral, resp. rate 20, height  (1.88 m), weight 216 lb (98 kg), SpO2 93 %.  Labs: Lab Results  Component Value Date   WBC 8.0 06/24/2017   HGB 9.1 (L) 06/24/2017   HCT 29.1 (L) 06/24/2017   MCV 97.3 06/24/2017   PLT 375 06/24/2017    Recent Labs  Lab 06/22/17 1216  06/25/17 0747  NA 136   < > 135  K 4.3   < > 4.1  CL 104   < > 102  CO2 24   < > 28  BUN 24*   < > 23*  CREATININE 1.04   < > 1.00  CALCIUM 8.5*   < > 8.8*  PROT 5.2*  --   --  BILITOT 1.6*  --   --   ALKPHOS 61  --   --   ALT 40  --   --   AST 24  --   --   GLUCOSE 114*   < > 110*   < > = values in this interval not displayed.   Lab Results  Component Value Date   CHOL 85 06/10/2017   HDL 42 06/10/2017   LDLCALC 25 06/10/2017   TRIG 89 06/10/2017   BNP (last 3 results) Recent Labs    06/07/17 1733  BNP 177.1*    ProBNP (last 3 results) No results for input(s): PROBNP in the last 8760 hours.   Diagnostic Studies/Procedures   CTA chest 06/22/17: - No evidence of pulmonary embolism. - Evidence of recent CABG with small left effusion and associated left basilar atelectasis.  Minimal dependent right basilar atelectasis. Moderate cardiomegaly.  Echo 06/22/17: - Left ventricle: The cavity size was normal. Wall thickness was   increased in a pattern of moderate LVH. Systolic function was   moderately to severely reduced. The estimated ejection fraction   was in the range of 30% to 35%. Incoordinate septal motion -   cannot exclude regional wall motion abnormalities. Doppler   parameters are consistent with abnormal left ventricular   relaxation (grade 1 diastolic dysfunction). The E/e&' ratio is   between 8-15, suggesting indeterminate LV filling pressure. - Aortic valve: Sclerosis without stenosis. There was mild   regurgitation. - Mitral valve: Mildly thickened leaflets . There was trivial   regurgitation. - Left atrium: The atrium was normal in size. - Inferior vena cava: The vessel was dilated. The respirophasic   diameter changes were blunted (< 50%), consistent with elevated   central venous pressure. - Pericardium, extracardiac: Trivial posterior pericardial   effusion.  Discharge Medications   Allergies as of 06/25/2017      Reactions   Ezetimibe Other (See Comments)   Joint pain and drops BP   Niacin Other (See Comments)   Drops BP and severe   Statins Other (See Comments)   Severe joint pain and drops BP.      Medication List    STOP taking these medications   lisinopril 2.5 MG tablet Commonly known as:  PRINIVIL,ZESTRIL     TAKE these medications   aspirin 325 MG EC tablet Take 1 tablet (325 mg total) by mouth daily.   bisacodyl 5 MG EC tablet Commonly known as:  DULCOLAX Take 1 tablet (5 mg total) by mouth daily as needed for moderate constipation.   carvedilol 6.25 MG tablet Commonly known as:  COREG Take 1 tablet (6.25 mg total) by mouth 2 (two) times daily with a meal.   docusate sodium 100 MG capsule Commonly known as:  COLACE Take 1 capsule (100 mg total) by mouth daily as needed for mild constipation.   fenofibrate  145 MG tablet Commonly known as:  TRICOR TAKE 1 TABLET BY MOUTH EVERY DAY   furosemide 40 MG tablet Commonly known as:  LASIX Take 1 tablet (40 mg total) by mouth daily. Start taking on:  06/27/2017 What changed:  These instructions start on 06/27/2017. If you are unsure what to do until then, ask your doctor or other care provider.   gabapentin 300 MG capsule Commonly known as:  NEURONTIN Take 300 mg by mouth at bedtime.   MENS MULTIVITAMIN PLUS PO Take 1 tablet by mouth 2 (two) times daily.   oxyCODONE 5 MG immediate release tablet Commonly known  as:  Oxy IR/ROXICODONE Take 1 tablet (5 mg total) by mouth every 4 (four) hours as needed for severe pain.   potassium chloride SA 20 MEQ tablet Commonly known as:  K-DUR,KLOR-CON Take 1 tablet (20 mEq total) by mouth daily.   REPATHA SURECLICK 140 MG/ML Soaj Generic drug:  Evolocumab Inject 140 mg into the skin every 14 (fourteen) days.   zolpidem 10 MG tablet Commonly known as:  AMBIEN Take 10 mg by mouth at bedtime.            Durable Medical Equipment  (From admission, onward)        Start     Ordered   06/25/17 0952  For home use only DME Vest life vest  Once     06/25/17 7544      Disposition   The patient will be discharged in stable condition to home. Discharge Instructions    (HEART FAILURE PATIENTS) Call MD:  Anytime you have any of the following symptoms: 1) 3 pound weight gain in 24 hours or 5 pounds in 1 week 2) shortness of breath, with or without a dry hacking cough 3) swelling in the hands, feet or stomach 4) if you have to sleep on extra pillows at night in order to breathe.   Complete by:  As directed    Call MD for:  persistant dizziness or light-headedness   Complete by:  As directed    Diet - low sodium heart healthy   Complete by:  As directed    Heart Failure patients record your daily weight using the same scale at the same time of day   Complete by:  As directed    Increase activity slowly    Complete by:  As directed    STOP any activity that causes chest pain, shortness of breath, dizziness, sweating, or exessive weakness   Complete by:  As directed      Follow-up Information    McKenzie, Mardene Celeste, MD. Go on 07/13/2017.   Specialty:  Urology Why:  @9 :30am Contact information: [address] [place] [address] [phone]        Graciella Freer, PA-C Follow up on 07/12/2017.   Specialty:  Physician Assistant Why:  Heart Failure Followup at [facility]-12:00-Parking at ER lot (enter under blue "Specialty Clinics" awning) or under [facility] on [address] (construction entrance, garage code: 1400, elevator 1st floor). Take all am meds, bring all med bottles. Contact information: [address] [place] [address] [phone]             Duration of Discharge Encounter: Greater than 35 minutes   Signed, Alford Highland, NP 06/25/2017, 4:25 PM

## 2017-06-25 NOTE — Progress Notes (Signed)
Patient called RN to room that he was feeling light headed and dizzy, and felt like his B/P was droping, RN check patient B/P and it was 94/63, HR of 73, heart failure team paged.

## 2017-06-28 ENCOUNTER — Ambulatory Visit: Payer: BLUE CROSS/BLUE SHIELD | Admitting: Adult Health

## 2017-06-28 ENCOUNTER — Other Ambulatory Visit: Payer: Self-pay | Admitting: *Deleted

## 2017-06-28 ENCOUNTER — Other Ambulatory Visit: Payer: Self-pay | Admitting: Urology

## 2017-06-28 ENCOUNTER — Other Ambulatory Visit: Payer: Self-pay | Admitting: Cardiothoracic Surgery

## 2017-06-28 ENCOUNTER — Telehealth (HOSPITAL_COMMUNITY): Payer: Self-pay

## 2017-06-28 DIAGNOSIS — I5023 Acute on chronic systolic (congestive) heart failure: Secondary | ICD-10-CM

## 2017-06-28 DIAGNOSIS — N35919 Unspecified urethral stricture, male, unspecified site: Secondary | ICD-10-CM

## 2017-06-28 MED ORDER — POTASSIUM CHLORIDE CRYS ER 20 MEQ PO TBCR: 20.0000 meq | EXTENDED_RELEASE_TABLET | Freq: Every day | ORAL | 1 refills | Status: DC

## 2017-06-28 NOTE — Telephone Encounter (Signed)
Called patient to see if he is interested in the Cardiac Rehab Program. Patient stated he is interested. Explained scheduling process to patient and went over insurance, patient verbalized understanding. Will contact patient once follow up appt has been completed upon review by the RN Navigator.

## 2017-07-01 ENCOUNTER — Telehealth: Payer: Self-pay

## 2017-07-01 NOTE — Telephone Encounter (Addendum)
Assessment Questions:  When was your surgery date? 06/14/2017   When were you discharged from the hospital? 06/21/17, but was recently re-admitted for loss of consciousness.   Who is your Cardiologist? Dr. Swaziland  Have you seen him/ her? Not since d'cd from the hosptial.   Who is your PCP/ Family Medicine Physician? Dr. Eloise Harman  Have you seen him/ her? No   1. Symptoms-  Right lower leg EVH site swelling, feet and hands, weight gain of 1# in a day.  2. Onset of Symptoms- Yesterday evening  3. Location- Right leg mainly   4. Severity (how bad is it?)- Not too bad  5. Are there any signs or symptoms of infection? NO  a. Redness b. Warm to touch c. Red streaks d. Swelling e. Drainage/ Puss f. Fowl odor/ smell g. Do you have a temperature?   6.  Pain (scale 0- 10)- some tenderness/ soreness on left side of chest.  I advised him he could alternate taking Ibprofen and tylenol for pain and see if that helps.  He acknowledged receipt.   7.  Other Symptoms? NO              Any Chest Pain/ Tightness/ Heaviness-    If so, is it severe?    If yes, please hang up and dial 911, or go to the nearest Emergency Department.   Shortness of Breath- I feel myself dozing off to sleep, and waking my self up "gasping for air".  I advised him to contact his PCP about this, could possibly be sleep apnea which he has been tested for "a long time ago".  If so, is it severe? NO If yes, please hang up and dial 911, or go to the nearest Emergency Department.  Lower Extremity Edema/ Swelling-   If so, are you on a Diuretic (fluid pill)? Yes  Have you been keeping your legs elevated as much as possible when seated? Not as much, I advised him to elevate his legs as much as possible when seated, and told him to try using compression stockings as well to help.  He acknowledged receipt.  I also advised him to contact his Cardiologist if he felt his Lasix was not working as well as it was and  reiterated contacting his Cardiologist if he has weight gain of 3# in one day, or 5# in a week.  Lasix was last prescribed by Cardiology.

## 2017-07-06 ENCOUNTER — Telehealth (HOSPITAL_COMMUNITY): Payer: Self-pay | Admitting: *Deleted

## 2017-07-06 NOTE — Telephone Encounter (Signed)
Faxed appeal to [phone] for pt.

## 2017-07-06 NOTE — Telephone Encounter (Signed)
Pt left VM stating [employer] denied [employer] life vest claim because of coronary bypass on 6/10. Pt scheduled to see app clinic on 7/6 but he is a Dr.McLean patient. Patient said he cant afford this. I spoke to Dr.McLeans RN and she will follow up with our [employer] rep and contact patient.   Message routed to [employer] (Dr.McLeans nurse)

## 2017-07-07 ENCOUNTER — Encounter (HOSPITAL_COMMUNITY): Payer: Self-pay

## 2017-07-07 NOTE — Progress Notes (Signed)
Lifevest for patient has been denied through [employer] Spoke with local [employer] life vest manager Elijan Bault directly who agrees this should be approved. Rep has sent patient a form of appeal to sign so that [employer] can appeal this decision on patient's behalf. States this may take a few weeks-months to get approval, however this is standard time frame for [employer] members. Will follow up with patient during his upcoming appt next week to see if he has yet received from.  Ave Filter, RN

## 2017-07-12 ENCOUNTER — Encounter (HOSPITAL_COMMUNITY): Payer: Self-pay

## 2017-07-12 ENCOUNTER — Ambulatory Visit (HOSPITAL_COMMUNITY)
Admission: RE | Admit: 2017-07-12 | Discharge: 2017-07-12 | Disposition: A | Discharge status: Home / Self Care | Payer: BLUE CROSS/BLUE SHIELD | Source: Ambulatory Visit | Attending: Cardiology | Admitting: Cardiology

## 2017-07-12 VITALS — BP 130/80 | HR 87 | Wt 217.6 lb

## 2017-07-12 DIAGNOSIS — N35919 Unspecified urethral stricture, male, unspecified site: Secondary | ICD-10-CM | POA: Diagnosis not present

## 2017-07-12 DIAGNOSIS — I5022 Chronic systolic (congestive) heart failure: Secondary | ICD-10-CM

## 2017-07-12 DIAGNOSIS — I252 Old myocardial infarction: Secondary | ICD-10-CM | POA: Diagnosis not present

## 2017-07-12 DIAGNOSIS — I255 Ischemic cardiomyopathy: Secondary | ICD-10-CM | POA: Diagnosis not present

## 2017-07-12 DIAGNOSIS — N182 Chronic kidney disease, stage 2 (mild): Secondary | ICD-10-CM | POA: Diagnosis not present

## 2017-07-12 DIAGNOSIS — Z87891 Personal history of nicotine dependence: Secondary | ICD-10-CM | POA: Insufficient documentation

## 2017-07-12 DIAGNOSIS — Z955 Presence of coronary angioplasty implant and graft: Secondary | ICD-10-CM | POA: Diagnosis not present

## 2017-07-12 DIAGNOSIS — I5043 Acute on chronic combined systolic (congestive) and diastolic (congestive) heart failure: Secondary | ICD-10-CM | POA: Diagnosis not present

## 2017-07-12 DIAGNOSIS — I5023 Acute on chronic systolic (congestive) heart failure: Secondary | ICD-10-CM | POA: Diagnosis not present

## 2017-07-12 DIAGNOSIS — I130 Hypertensive heart and chronic kidney disease with heart failure and stage 1 through stage 4 chronic kidney disease, or unspecified chronic kidney disease: Secondary | ICD-10-CM | POA: Insufficient documentation

## 2017-07-12 DIAGNOSIS — Z888 Allergy status to other drugs, medicaments and biological substances status: Secondary | ICD-10-CM | POA: Diagnosis not present

## 2017-07-12 DIAGNOSIS — Z79899 Other long term (current) drug therapy: Secondary | ICD-10-CM | POA: Insufficient documentation

## 2017-07-12 DIAGNOSIS — Z951 Presence of aortocoronary bypass graft: Secondary | ICD-10-CM

## 2017-07-12 DIAGNOSIS — Z7982 Long term (current) use of aspirin: Secondary | ICD-10-CM | POA: Insufficient documentation

## 2017-07-12 DIAGNOSIS — I2583 Coronary atherosclerosis due to lipid rich plaque: Secondary | ICD-10-CM

## 2017-07-12 DIAGNOSIS — I447 Left bundle-branch block, unspecified: Secondary | ICD-10-CM | POA: Diagnosis not present

## 2017-07-12 DIAGNOSIS — I2510 Atherosclerotic heart disease of native coronary artery without angina pectoris: Secondary | ICD-10-CM | POA: Diagnosis not present

## 2017-07-12 LAB — BASIC METABOLIC PANEL
Anion gap: 8 (ref 5–15)
BUN: 19 mg/dL (ref 8–23)
CO2: 29 mmol/L (ref 22–32)
Calcium: 9.6 mg/dL (ref 8.9–10.3)
Chloride: 104 mmol/L (ref 98–111)
Creatinine, Ser: 1.1 mg/dL (ref 0.61–1.24)
GFR calc Af Amer: >60 mL/min (ref >60)
GFR calc non Af Amer: >60 mL/min (ref >60)
Glucose, Bld: 90 mg/dL (ref 70–99)
Potassium: 4.3 mmol/L (ref 3.5–5.1)
Sodium: 141 mmol/L (ref 135–145)

## 2017-07-12 MED ORDER — LOSARTAN POTASSIUM 25 MG PO TABS: 12.5000 mg | ORAL_TABLET | Freq: Every day | ORAL | 3 refills | Status: DC

## 2017-07-12 NOTE — Progress Notes (Signed)
Advanced Heart Failure Clinic Note   PCP: Jarome Matin, MD PCP-Cardiologist: Peter Swaziland, MD  HF: Dr. Shirlee Latch   HPI:  [name] is a 62 y.o. male with CAD s/p CABG x 4 06/14/17, HLD, HTN, chronic systolic CHF, CKD II, and urethral stricture s/p suprapubic catheterization.   Admitted 6/18 - 06/25/17 after syncope in urology office s/p CABG as above. With low EF, concern for VT/VF and LBB, Lifevest ordered. Meds cut back on day of discharge due to mild hypotension and orthostasis.   He presents today for post hospital follow up. Lifevest has been denied and working on appeal. He has been feeling great for the past few days. Not having lightheadedness. Taking all medications as directed. He is still having trouble sleeping at night due to pain, though seems to be improving somewhat. Surgical follow up upcoming.  He is making urinating as well as having output through his suprapubic catheter. Site is stable.   Past Medical History 1. Syncope: Unclear cause.  - Telemetry with nonsustained WCT during admission, has baseline LBBB and did not appear to change axis. Unclear if SVT with aberrancy or VT. Discharged with LifeVest.  2. Chronic systolic CHF: Ischemic cardiomyopathy.  - Echo 06/09/17 EF 20-25%. Echo 06/22/17 EF up to 30-35%.  - Discharged with LifeVest. Will need echo in 3 months. If EF remains low, will need CRT-D (LBBB) 3. Unilateral leg swelling and dyspnea:  - CTA chest negative for PE. Venous dopplers negative for DVT. 4. CAD: S/p recent CABG.  - S/p CABG 06/2017. Continue ASA.  - Cannot tolerate statins or Zetia. Continue Repatha.  5. Urethral stricture with suprapubic catheter: - Urology following. Suprapubic catheter inserted during CABG.  - Sees urology again in 2 weeks for work up +/- removal.   Review of systems complete and found to be negative unless listed in HPI.  Past Medical History:  Diagnosis Date  . Arthritis    "mild; back, neck, spine" (06/09/2017)    . CHF (congestive heart failure) (HCC)   . Coronary artery disease   . History of gout   . History of kidney stones X 2  . Hypercholesterolemia   . Hypertension   . LBBB (left bundle branch block)   . MI (myocardial infarction) (HCC) 1993   LATERAL  . MI (myocardial infarction) (HCC) ?09/2001; 06/07/2017  . Pneumonia    "several times" (06/09/2017)  . S/P coronary artery stent placement    RCA  . Varicose veins     Current Outpatient Medications  Medication Sig Dispense Refill  . aspirin 325 MG EC tablet Take 1 tablet (325 mg total) by mouth daily. 30 tablet 0  . carvedilol (COREG) 6.25 MG tablet Take 1 tablet (6.25 mg total) by mouth 2 (two) times daily with a meal. 60 tablet 1  . docusate sodium (COLACE) 50 MG capsule Take 50 mg by mouth 3 (three) times daily.    . Evolocumab (REPATHA SURECLICK) 140 MG/ML SOAJ Inject 140 mg into the skin every 14 (fourteen) days.     . fenofibrate (TRICOR) 145 MG tablet TAKE 1 TABLET BY MOUTH EVERY DAY 90 tablet 3  . furosemide (LASIX) 40 MG tablet Take 1 tablet (40 mg total) by mouth daily. 30 tablet 6  . gabapentin (NEURONTIN) 300 MG capsule Take 300 mg by mouth at bedtime.  2  . Multiple Vitamins-Minerals (MENS MULTIVITAMIN PLUS PO) Take 1 tablet by mouth 2 (two) times daily.    Marland Kitchen oxyCODONE (OXY IR/ROXICODONE) 5 MG  immediate release tablet Take 1 tablet (5 mg total) by mouth every 4 (four) hours as needed for severe pain. 30 tablet 0  . potassium chloride SA (K-DUR,KLOR-CON) 20 MEQ tablet Take 1 tablet (20 mEq total) by mouth daily. 30 tablet 1  . zolpidem (AMBIEN) 10 MG tablet Take 10 mg by mouth at bedtime.     No current facility-administered medications for this encounter.     Allergies  Allergen Reactions  . Ezetimibe Other (See Comments)    Joint pain and drops BP  . Niacin Other (See Comments)    Drops BP and severe  . Statins Other (See Comments)    Severe joint pain and drops BP.      Social History   Socioeconomic History   . Marital status: Married    Spouse name: Not on file  . Number of children: 1  . Years of education: Not on file  . Highest education level: Not on file  Occupational History  . Occupation: Truck Chief Strategy Officer  . Financial resource strain: Not on file  . Food insecurity:    Worry: Not on file    Inability: Not on file  . Transportation needs:    Medical: Not on file    Non-medical: Not on file  Tobacco Use  . Smoking status: Former Smoker    Packs/day: 2.00    Years: 20.00    Pack years: 40.00    Types: Cigarettes    Last attempt to quit: 05/07/1999    Years since quitting: 18.1  . Smokeless tobacco: Never Used  Substance and Sexual Activity  . Alcohol use: Yes    Comment: 06/09/2017 "1 beer/month; at most"  . Drug use: Never  . Sexual activity: Not on file  Lifestyle  . Physical activity:    Days per week: Not on file    Minutes per session: Not on file  . Stress: Not on file  Relationships  . Social connections:    Talks on phone: Not on file    Gets together: Not on file    Attends religious service: Not on file    Active member of club or organization: Not on file    Attends meetings of clubs or organizations: Not on file    Relationship status: Not on file  . Intimate partner violence:    Fear of current or ex partner: Not on file    Emotionally abused: Not on file    Physically abused: Not on file    Forced sexual activity: Not on file  Other Topics Concern  . Not on file  Social History Narrative  . Not on file      Family History  Problem Relation Age of Onset  . Heart attack Father   . Diabetes Father   . Breast cancer Mother   . Coronary artery disease Mother   . Heart disease Brother   . Heart attack Maternal Grandfather    Vitals:   07/12/17 1203  BP: 130/80  Pulse: 87  SpO2: 99%  Weight: 217 lb 9.6 oz (98.7 kg)    Wt Readings from Last 3 Encounters:  07/12/17 217 lb 9.6 oz (98.7 kg)  06/25/17 216 lb (98 kg)  06/21/17 228 lb  14.4 oz (103.8 kg)   PHYSICAL EXAM: General: Well appearing. No resp difficulty. HEENT: Normal Neck: Supple. JVP 5-6. Carotids 2+ bilat; no bruits. No thyromegaly or nodule noted. Cor: PMI nondisplaced. RRR, No M/G/R noted Lungs: CTAB, normal effort.  Abdomen: Soft, non-tender, non-distended, no HSM. No bruits or masses. +BS  Extremities: No cyanosis, clubbing, or rash. R and LLE no edema.  Neuro: Alert & orientedx3, cranial nerves grossly intact. moves all 4 extremities w/o difficulty. Affect pleasant   ASSESSMENT & PLAN:  1. Acute on chronic systolic CHF: Ischemic cardiomyopathy.  - Echo 06/09/17 EF 20-25%. Echo 06/22/17 EF up to 30-35%.  - NYHA II symptoms - Volume status stable on exam.   - Continue lasix 40 mg daily.  - Continue Coreg 6.25 mg bid.  - Will cautiously add Losartan 12.5 mg qhs. BMET today. If > dizziness, will stop.  Cleda Daub not started due to hypotension. - Discharged with LifeVest - Will need echo in September. If EF remains low, will need CRT-D (LBBB) 2. Syncope: Unclear cause.  - With low EF, ventricular arrhythmia is certainly a concern. Also, with LBBB, progressive conduction disease with bradyarrhythmia is a possibility. Certainly could have been a vagal event, but he had no trigger (was not in pain, just sitting in urology office prior to his appt).  - Telemetry with nonsustained WCT during recent admission. Has baseline LBBB and did not appear to change axis. Unclear if SVT with aberrancy or VT.  - LifeVest in place. Continues to work on approval.  3. Unilateral leg swelling and dyspnea:  - CTA chest 06/2017 negative for PE.  - Venous dopplers negative for DVT. 4. CAD: S/p CABG 06/2017 - No s/s of ischemia.    - Continue ASA.  - Cannot tolerate statins or Zetia. Continue Repatha.  5. Urethral stricture with suprapubic catheter: - Per Urology.   Meds and labs as above.   Graciella Freer, PA-C 07/12/17   Greater than 50% of the 25 minute  visit was spent in counseling/coordination of care regarding disease state education, salt/fluid restriction, sliding scale diuretics, and medication compliance.

## 2017-07-12 NOTE — Patient Instructions (Signed)
START Losartan 12.5 mg, one half tab daily at bedtime If you start to have dizziness please stop  Labs today We will only contact you if something comes back abnormal or we need to make some changes. Otherwise no news is good news!  You have been referred to [facility] rehab, they will be in touch to arrange orientation [phone]  Your physician recommends that you schedule a follow-up appointment in: 3-4 weeks with Joanell Rising

## 2017-07-19 ENCOUNTER — Other Ambulatory Visit: Payer: Self-pay | Admitting: Cardiothoracic Surgery

## 2017-07-20 ENCOUNTER — Other Ambulatory Visit (HOSPITAL_COMMUNITY): Payer: Self-pay

## 2017-07-20 ENCOUNTER — Other Ambulatory Visit: Payer: Self-pay | Admitting: Cardiothoracic Surgery

## 2017-07-20 DIAGNOSIS — Z951 Presence of aortocoronary bypass graft: Secondary | ICD-10-CM

## 2017-07-20 MED ORDER — ASPIRIN 325 MG PO TBEC: 325.0000 mg | DELAYED_RELEASE_TABLET | Freq: Every day | ORAL | 3 refills | Status: DC

## 2017-07-21 ENCOUNTER — Ambulatory Visit (INDEPENDENT_AMBULATORY_CARE_PROVIDER_SITE_OTHER): Payer: Self-pay | Admitting: Cardiothoracic Surgery

## 2017-07-21 ENCOUNTER — Encounter (HOSPITAL_COMMUNITY): Payer: Self-pay

## 2017-07-21 ENCOUNTER — Encounter: Payer: Self-pay | Admitting: Cardiothoracic Surgery

## 2017-07-21 ENCOUNTER — Ambulatory Visit
Admission: RE | Admit: 2017-07-21 | Discharge: 2017-07-21 | Disposition: A | Discharge status: Home / Self Care | Payer: BLUE CROSS/BLUE SHIELD | Source: Ambulatory Visit | Attending: Cardiothoracic Surgery | Admitting: Cardiothoracic Surgery

## 2017-07-21 VITALS — BP 105/63 | HR 84 | Resp 20 | Ht 74.0 in | Wt 218.0 lb

## 2017-07-21 DIAGNOSIS — Z951 Presence of aortocoronary bypass graft: Secondary | ICD-10-CM

## 2017-07-21 DIAGNOSIS — J90 Pleural effusion, not elsewhere classified: Secondary | ICD-10-CM | POA: Diagnosis not present

## 2017-07-21 DIAGNOSIS — I2510 Atherosclerotic heart disease of native coronary artery without angina pectoris: Secondary | ICD-10-CM

## 2017-07-21 DIAGNOSIS — J9811 Atelectasis: Secondary | ICD-10-CM | POA: Diagnosis not present

## 2017-07-21 DIAGNOSIS — I5023 Acute on chronic systolic (congestive) heart failure: Secondary | ICD-10-CM

## 2017-07-21 MED ORDER — OXYCODONE HCL 5 MG PO TABS: 5.0000 mg | ORAL_TABLET | ORAL | 0 refills | Status: DC | PRN

## 2017-07-21 NOTE — Progress Notes (Signed)
PCP is Jarome Matin, MD Referring Provider is [place], Dewana Ammirati M, MD  Chief Complaint  Patient presents with  . Routine Post Op    f/u from surgery with CXR S/P CABG x4    HPI: 1 month follow-up after CABG x4 for ischemic cardiomyopathy, heart failure, non-STEMI  with preop EF 25%.  Patient had tight urethral stricture and required suprapubic tube placed by urology prior to surgery.  He had some pulmonary problems after surgery because of a smoking history.  He was followed up by urology for care of the suprapubic tube following discharge.  He had a syncopal episode in the urology office and was readmitted to the hospital cardiology service.  He had a LifeVest placed at discharge for EF less than 30%.  Patient has maintained sinus rhythm.  He denies recurrent angina or symptoms of CHF.  His chest x-ray today is clear.  Patient has persistent chest wall pain as well as pain around the suprapubic site.  He is taking 1 oxycodone at night to help with some sleep.  He will be given 1 more refill on that prescription but then no more narcotic.  He is to see his urologist next week to make a plan regarding surgery to reestablish flow through the urethra.  Past Medical History:  Diagnosis Date  . Arthritis    "mild; back, neck, spine" (06/09/2017)  . CHF (congestive heart failure) (HCC)   . Coronary artery disease   . History of gout   . History of kidney stones X 2  . Hypercholesterolemia   . Hypertension   . LBBB (left bundle branch block)   . MI (myocardial infarction) (HCC) 1993   LATERAL  . MI (myocardial infarction) (HCC) ?09/2001; 06/07/2017  . Pneumonia    "several times" (06/09/2017)  . S/P coronary artery stent placement    RCA  . Varicose veins     Past Surgical History:  Procedure Laterality Date  . ANTERIOR CERVICAL DECOMP/DISCECTOMY FUSION  03/03/2011   Procedure: ANTERIOR CERVICAL DECOMPRESSION/DISCECTOMY FUSION 3 LEVELS;  Surgeon: Karn Cassis, MD;  Location: [facility] NEURO  ORS;  Service: Neurosurgery;  Laterality: N/A;  Cervical three-four Cervical four-five Cervical five-six Anterior cervical decompression/diskectomy, fusion  . BACK SURGERY    . CARDIAC CATHETERIZATION  2009   Stents in RCA patent. 70 to 80% PL, and 50% ostial PD.   Marland Kitchen CARDIAC CATHETERIZATION N/A 11/20/2014   Procedure: Left Heart Cath and Coronary Angiography;  Surgeon: Karizma Cheek M Sw[place], MD;  Location: [facility] INVASIVE CV LAB;  Service: Cardiovascular;  Laterality: N/A;  . CORONARY ANGIOPLASTY     DIRECT ANGIOPLASTY THE MARGINAL BRANCH  . CORONARY ANGIOPLASTY WITH STENT PLACEMENT     RCA  . CORONARY ARTERY BYPASS GRAFT N/A 06/14/2017   Procedure: CORONARY ARTERY BYPASS GRAFTING (CABG) x four, using left internal mammary artery and right leg greater saphenous vein harvested endoscopically;  Surgeon: Kerin Perna, MD;  Location: [facility] OR;  Service: Open Heart Surgery;  Laterality: N/A;  . CYSTOSCOPY N/A 06/14/2017   Procedure: CYSTOSCOPY;  Surgeon: Donata Clay, Theron Arista, MD;  Location: [facility] OR;  Service: Open Heart Surgery;  Laterality: N/A;  . INSERTION OF SUPRAPUBIC CATHETER N/A 06/14/2017   Procedure: INSERTION OF SUPRAPUBIC CATHETER - Lower abdomen;  Surgeon: Kerin Perna, MD;  Location: [facility] OR;  Service: Open Heart Surgery;  Laterality: N/A;  . POSTERIOR LUMBAR FUSION  10/2011  . RIGHT/LEFT HEART CATH AND CORONARY ANGIOGRAPHY N/A 06/08/2017   Procedure: RIGHT/LEFT HEART CATH AND  CORONARY ANGIOGRAPHY;  Surgeon: Swaziland, Antonietta Lansdowne M, MD;  Location: [facility] INVASIVE CV LAB;  Service: Cardiovascular;  Laterality: N/A;  . TEE WITHOUT CARDIOVERSION N/A 06/14/2017   Procedure: TRANSESOPHAGEAL ECHOCARDIOGRAM (TEE);  Surgeon: Donata Clay, Theron Arista, MD;  Location: [facility] OR;  Service: Open Heart Surgery;  Laterality: N/A;    Family History  Problem Relation Age of Onset  . Heart attack Father   . Diabetes Father   . Breast cancer Mother   . Coronary artery disease Mother   . Heart disease Brother   . Heart attack Maternal  Grandfather     Social History Social History   Tobacco Use  . Smoking status: Former Smoker    Packs/day: 2.00    Years: 20.00    Pack years: 40.00    Types: Cigarettes    Last attempt to quit: 05/07/1999    Years since quitting: 18.2  . Smokeless tobacco: Never Used  Substance Use Topics  . Alcohol use: Yes    Comment: 06/09/2017 "1 beer/month; at most"  . Drug use: Never    Current Outpatient Medications  Medication Sig Dispense Refill  . aspirin 325 MG EC tablet Take 1 tablet (325 mg total) by mouth daily. 30 tablet 3  . carvedilol (COREG) 6.25 MG tablet Take 1 tablet (6.25 mg total) by mouth 2 (two) times daily with a meal. 60 tablet 1  . docusate sodium (COLACE) 50 MG capsule Take 50 mg by mouth 3 (three) times daily.    . Evolocumab (REPATHA SURECLICK) 140 MG/ML SOAJ Inject 140 mg into the skin every 14 (fourteen) days.     . fenofibrate (TRICOR) 145 MG tablet TAKE 1 TABLET BY MOUTH EVERY DAY 90 tablet 3  . furosemide (LASIX) 40 MG tablet Take 1 tablet (40 mg total) by mouth daily. 30 tablet 6  . gabapentin (NEURONTIN) 300 MG capsule Take 300 mg by mouth at bedtime.  2  . losartan (COZAAR) 25 MG tablet Take 0.5 tablets (12.5 mg total) by mouth at bedtime. 15 tablet 3  . Multiple Vitamins-Minerals (MENS MULTIVITAMIN PLUS PO) Take 1 tablet by mouth 2 (two) times daily.    Marland Kitchen oxyCODONE (OXY IR/ROXICODONE) 5 MG immediate release tablet Take 1 tablet (5 mg total) by mouth every 4 (four) hours as needed for severe pain. 28 tablet 0  . potassium chloride SA (K-DUR,KLOR-CON) 20 MEQ tablet Take 1 tablet (20 mEq total) by mouth daily. 30 tablet 1  . zolpidem (AMBIEN) 10 MG tablet Take 10 mg by mouth at bedtime.     No current facility-administered medications for this visit.     Allergies  Allergen Reactions  . Ezetimibe Other (See Comments)    Joint pain and drops BP  . Niacin Other (See Comments)    Drops BP and severe  . Statins Other (See Comments)    Severe joint pain and  drops BP.    Review of Systems  No fever No blood in urine Weight starting to increase Sleeping 4 hours per night No edema  BP 105/63   Pulse 84   Resp 20   Ht 6\' 2"  (1.88 m)   Wt 218 lb (98.9 kg)   SpO2 96% Comment: RA  BMI 27.99 kg/m  Physical Exam      Exam    General- alert and comfortable    Neck- no JVD, no cervical adenopathy palpable, no carotid bruit   Lungs- clear without rales, wheezes sternal incision well-healed.   Cor- regular rate and rhythm,  no murmur , gallop   Abdomen- soft, non-tender   Extremities - warm, non-tender, minimal edema   Neuro- oriented, appropriate, no focal weakness   Diagnostic Tests: Chest x-ray clear  Impression: Patient has recovered approximately 50% following CABG. He is not ready for outpatient cardiac rehab until the suprapubic tube is removed.  He may drive do normal daily activities but should not lift more than 20 pounds.  He was given a final prescription for oral narcotic because of persistent chest wall pain and pain at the suprapubic catheter site. Plan: Return in 4 weeks to assess progress and to discuss return to work.  Continue current medications    Mikey Bussing, MD [facility] [phone]

## 2017-07-21 NOTE — Progress Notes (Signed)
[facility] Form completed and mailed to [employer] via enclosed envelope provided and stamped by patient. Copy of forms scanned into patient's electronic medical record.  Ave Filter, RN

## 2017-07-22 ENCOUNTER — Ambulatory Visit (HOSPITAL_COMMUNITY)
Admission: RE | Admit: 2017-07-22 | Discharge: 2017-07-22 | Disposition: A | Discharge status: Home / Self Care | Payer: BLUE CROSS/BLUE SHIELD | Source: Ambulatory Visit | Attending: Urology | Admitting: Urology

## 2017-07-22 DIAGNOSIS — N35919 Unspecified urethral stricture, male, unspecified site: Secondary | ICD-10-CM | POA: Diagnosis not present

## 2017-07-22 DIAGNOSIS — Z466 Encounter for fitting and adjustment of urinary device: Secondary | ICD-10-CM | POA: Diagnosis not present

## 2017-07-22 MED ORDER — IOPAMIDOL (ISOVUE-300) INJECTION 61%
INTRAVENOUS | Status: AC
  Administered 2017-07-22: 190 mL via URETHRAL
  Filled 2017-07-22: qty 50

## 2017-07-25 DIAGNOSIS — I472 Ventricular tachycardia: Secondary | ICD-10-CM | POA: Diagnosis not present

## 2017-07-25 DIAGNOSIS — Z951 Presence of aortocoronary bypass graft: Secondary | ICD-10-CM | POA: Diagnosis not present

## 2017-07-25 DIAGNOSIS — I255 Ischemic cardiomyopathy: Secondary | ICD-10-CM | POA: Diagnosis not present

## 2017-07-25 DIAGNOSIS — I214 Non-ST elevation (NSTEMI) myocardial infarction: Secondary | ICD-10-CM | POA: Diagnosis not present

## 2017-07-26 DIAGNOSIS — N35011 Post-traumatic bulbous urethral stricture: Secondary | ICD-10-CM | POA: Diagnosis not present

## 2017-07-27 DIAGNOSIS — N35011 Post-traumatic bulbous urethral stricture: Secondary | ICD-10-CM | POA: Diagnosis not present

## 2017-07-28 ENCOUNTER — Telehealth: Payer: Self-pay | Admitting: Cardiology

## 2017-07-28 NOTE — Telephone Encounter (Signed)
Pam with [facility]      [facility] Pre-operative Risk Assessment    Request for surgical clearance:  1. What type of surgery is being performed? Urethroplasty  2. When is this surgery scheduled? TBD  3. What type of clearance is required (medical clearance vs. Pharmacy clearance to hold med vs. Both)? Both  4. Are there any medications that need to be held prior to surgery and how long? ASA-5 days  5. Practice name and name of physician performing surgery? [facility] -Dr. Dayna Ramus  6. What is your office phone number [phone]   7.   What is your office fax number [phone]  8.   Anesthesia type (None, local, MAC, general) ? General   Melissa A Tatum 07/28/2017, 1:12 PM  _________________________________________________________________   (provider comments below)  ==

## 2017-08-02 ENCOUNTER — Encounter (HOSPITAL_COMMUNITY): Payer: Self-pay

## 2017-08-02 NOTE — Progress Notes (Signed)
[employer]-Physician Statement of Disability forms completed and mailed in enclosed addressed envelope by patient to: [employer] [address] [place], [place]   [id number] Copy of forms scanned into patient's electronic medical record.  Ave Filter, RN

## 2017-08-09 NOTE — Progress Notes (Addendum)
Advanced Heart Failure Clinic Note   PCP: Jarome Matin, MD PCP-Cardiologist: Peter Swaziland, MD  HF: Dr. Shirlee Latch   HPI:  [name] is a 62 y.o. male with CAD s/p CABG x 4 06/14/17, HLD, HTN, chronic systolic CHF, CKD II, and urethral stricture s/p suprapubic catheterization.   Admitted 6/18 - 06/25/17 after syncope in urology office s/p CABG as above. With low EF, concern for VT/VF and LBB, Lifevest ordered. Meds cut back on day of discharge due to mild hypotension and orthostasis.   He presents today for regular follow up. Feeling OK this am. He was lightheaded walking in and is so most days. He has been off lasix for 2 weeks and is drinking 6 bottles of water daily.  Lifevest remains in place. He denies SOB. He denies exertional CP. He does have CABG site pain from time to time, occasionally when lying down. Suprapubic catheter remains in place. He needs surgical clearance for a "repair".   Past Medical History 1. Syncope: Unclear cause.  - Telemetry with nonsustained WCT during admission, has baseline LBBB and did not appear to change axis. Unclear if SVT with aberrancy or VT. Discharged with LifeVest.  2. Chronic systolic CHF: Ischemic cardiomyopathy.  - Echo 06/09/17 EF 20-25%. Echo 06/22/17 EF up to 30-35%.  - Discharged with LifeVest. Will need echo in 3 months. If EF remains low, will need CRT-D (LBBB) 3. Unilateral leg swelling and dyspnea:  - CTA chest negative for PE. Venous dopplers negative for DVT. 4. CAD: S/p recent CABG.  - S/p CABG 06/2017. Continue ASA.  - Cannot tolerate statins or Zetia. Continue Repatha.  5. Urethral stricture with suprapubic catheter: - Urology following. Suprapubic catheter inserted during CABG.  - Sees urology again in 2 weeks for work up +/- removal.   Review of systems complete and found to be negative unless listed in HPI.    Past Medical History:  Diagnosis Date  . Arthritis    "mild; back, neck, spine" (06/09/2017)  . CHF  (congestive heart failure) (HCC)   . Coronary artery disease   . History of gout   . History of kidney stones X 2  . Hypercholesterolemia   . Hypertension   . LBBB (left bundle branch block)   . MI (myocardial infarction) (HCC) 1993   LATERAL  . MI (myocardial infarction) (HCC) ?09/2001; 06/07/2017  . Pneumonia    "several times" (06/09/2017)  . S/P coronary artery stent placement    RCA  . Varicose veins     Current Outpatient Medications  Medication Sig Dispense Refill  . aspirin 81 MG chewable tablet Chew 81 mg by mouth daily.    . carvedilol (COREG) 6.25 MG tablet Take 1 tablet (6.25 mg total) by mouth 2 (two) times daily with a meal. 60 tablet 1  . Evolocumab (REPATHA SURECLICK) 140 MG/ML SOAJ Inject 140 mg into the skin every 14 (fourteen) days.     . fenofibrate (TRICOR) 145 MG tablet TAKE 1 TABLET BY MOUTH EVERY DAY 90 tablet 3  . gabapentin (NEURONTIN) 300 MG capsule Take 300 mg by mouth at bedtime.  2  . losartan (COZAAR) 25 MG tablet Take 0.5 tablets (12.5 mg total) by mouth at bedtime. 15 tablet 3  . Multiple Vitamins-Minerals (MENS MULTIVITAMIN PLUS PO) Take 1 tablet by mouth 2 (two) times daily.    Marland Kitchen oxyCODONE (OXY IR/ROXICODONE) 5 MG immediate release tablet Take 1 tablet (5 mg total) by mouth every 4 (four) hours as needed for  severe pain. 28 tablet 0  . zolpidem (AMBIEN) 10 MG tablet Take 10 mg by mouth at bedtime.     No current facility-administered medications for this encounter.     Allergies  Allergen Reactions  . Ezetimibe Other (See Comments)    Joint pain and drops BP  . Niacin Other (See Comments)    Drops BP and severe  . Statins Other (See Comments)    Severe joint pain and drops BP.      Social History   Socioeconomic History  . Marital status: Married    Spouse name: Not on file  . Number of children: 1  . Years of education: Not on file  . Highest education level: Not on file  Occupational History  . Occupation: Truck Nature conservation officer  . Financial resource strain: Not on file  . Food insecurity:    Worry: Not on file    Inability: Not on file  . Transportation needs:    Medical: Not on file    Non-medical: Not on file  Tobacco Use  . Smoking status: Former Smoker    Packs/day: 2.00    Years: 20.00    Pack years: 40.00    Types: Cigarettes    Last attempt to quit: 05/07/1999    Years since quitting: 18.2  . Smokeless tobacco: Never Used  Substance and Sexual Activity  . Alcohol use: Yes    Comment: 06/09/2017 "1 beer/month; at most"  . Drug use: Never  . Sexual activity: Not on file  Lifestyle  . Physical activity:    Days per week: Not on file    Minutes per session: Not on file  . Stress: Not on file  Relationships  . Social connections:    Talks on phone: Not on file    Gets together: Not on file    Attends religious service: Not on file    Active member of club or organization: Not on file    Attends meetings of clubs or organizations: Not on file    Relationship status: Not on file  . Intimate partner violence:    Fear of current or ex partner: Not on file    Emotionally abused: Not on file    Physically abused: Not on file    Forced sexual activity: Not on file  Other Topics Concern  . Not on file  Social History Narrative  . Not on file      Family History  Problem Relation Age of Onset  . Heart attack Father   . Diabetes Father   . Breast cancer Mother   . Coronary artery disease Mother   . Heart disease Brother   . Heart attack Maternal Grandfather    Vitals:   08/10/17 0839 08/10/17 0844  BP: 122/76 102/66  Pulse: 79   SpO2: 96%   Weight: 223 lb (101.2 kg)     Wt Readings from Last 3 Encounters:  08/10/17 223 lb (101.2 kg)  07/21/17 218 lb (98.9 kg)  07/12/17 217 lb 9.6 oz (98.7 kg)   PHYSICAL EXAM: General: Well appearing. No resp difficulty. HEENT: Normal Neck: Supple. JVP 5-6. Carotids 2+ bilat; no bruits. No thyromegaly or nodule noted. Cor: PMI nondisplaced.  RRR, No M/G/R noted Lungs: CTAB, normal effort. Abdomen: Soft, non-tender, non-distended, no HSM. No bruits or masses. +BS  Extremities: No cyanosis, clubbing, or rash. R and LLE no edema.  Neuro: Alert & orientedx3, cranial nerves grossly intact. moves all 4 extremities w/o  difficulty. Affect pleasant   ASSESSMENT & PLAN:  1. Acute on chronic systolic CHF: Ischemic cardiomyopathy.  - Echo 06/09/17 EF 20-25%. Echo 06/22/17 EF up to 30-35%.  - NYHA II-III symptoms - Volume status stable to dry on exam with orthostatis.   - Continue lasix as needed.  - Continue Coreg 6.25 mg bid.  - Stop losartan for now with orthostasis.  Cleda Daub not started due to hypotension. - Discharged with LifeVest - Plan echo 09/14/17 or after. If EF remains low, will need CRT-D (LBBB) 2. Syncope: Unclear cause.  - With low EF, ventricular arrhythmia is certainly a concern. Also, with LBBB, progressive conduction disease with bradyarrhythmia is a possibility. Certainly could have been a vagal event, but he had no trigger (was not in pain, just sitting in urology office prior to his appt).  - Telemetry with nonsustained WCT during recent admission. Has baseline LBBB and did not appear to change axis. Unclear if SVT with aberrancy or VT.  - LifeVest in place. Echo next month.  3. Unilateral leg swelling and dyspnea:  - CTA chest 06/2017 negative for PE.  - Venous dopplers negative for DVT. - Resolved.  4. CAD: S/p CABG 06/2017 - No s/s of ischemia.    - Continue ASA.  - Cannot tolerate statins or Zetia. Continue Repatha.  5. Urethral stricture with suprapubic catheter: - Per Urology.  - Cardiac clearance is granted. He is at least moderate risk of peri-operative cardiac complications with h/o CAD and CHF. Should be done in hospital.  Spoke with Dr. Shirlee Latch who agrees.  6. Orthostasis - Off losartan for now.  - Will follow closely with pharmacy. Has been very difficult to titrate his meds.   Labs and meds as  above. RTC 2 weeks for pharmacy follow up. Echo in early September, 3 months from CABG. MD side 6 weeks.   Graciella Freer, PA-C 08/10/17   Greater than 50% of the 30 minute visit was spent in counseling/coordination of care regarding disease state education, salt/fluid restriction, sliding scale diuretics, and medication compliance.

## 2017-08-10 ENCOUNTER — Encounter (HOSPITAL_COMMUNITY): Payer: Self-pay

## 2017-08-10 ENCOUNTER — Ambulatory Visit (HOSPITAL_COMMUNITY)
Admission: RE | Admit: 2017-08-10 | Discharge: 2017-08-10 | Disposition: A | Discharge status: Home / Self Care | Payer: BLUE CROSS/BLUE SHIELD | Source: Ambulatory Visit | Attending: Cardiology | Admitting: Cardiology

## 2017-08-10 VITALS — BP 102/66 | HR 79 | Wt 223.0 lb

## 2017-08-10 DIAGNOSIS — I252 Old myocardial infarction: Secondary | ICD-10-CM | POA: Insufficient documentation

## 2017-08-10 DIAGNOSIS — M109 Gout, unspecified: Secondary | ICD-10-CM | POA: Insufficient documentation

## 2017-08-10 DIAGNOSIS — I447 Left bundle-branch block, unspecified: Secondary | ICD-10-CM

## 2017-08-10 DIAGNOSIS — M7989 Other specified soft tissue disorders: Secondary | ICD-10-CM | POA: Insufficient documentation

## 2017-08-10 DIAGNOSIS — Z87891 Personal history of nicotine dependence: Secondary | ICD-10-CM | POA: Insufficient documentation

## 2017-08-10 DIAGNOSIS — I255 Ischemic cardiomyopathy: Secondary | ICD-10-CM | POA: Insufficient documentation

## 2017-08-10 DIAGNOSIS — Z833 Family history of diabetes mellitus: Secondary | ICD-10-CM | POA: Insufficient documentation

## 2017-08-10 DIAGNOSIS — N182 Chronic kidney disease, stage 2 (mild): Secondary | ICD-10-CM | POA: Insufficient documentation

## 2017-08-10 DIAGNOSIS — I130 Hypertensive heart and chronic kidney disease with heart failure and stage 1 through stage 4 chronic kidney disease, or unspecified chronic kidney disease: Secondary | ICD-10-CM | POA: Diagnosis not present

## 2017-08-10 DIAGNOSIS — Z8249 Family history of ischemic heart disease and other diseases of the circulatory system: Secondary | ICD-10-CM | POA: Insufficient documentation

## 2017-08-10 DIAGNOSIS — E7800 Pure hypercholesterolemia, unspecified: Secondary | ICD-10-CM | POA: Insufficient documentation

## 2017-08-10 DIAGNOSIS — Z803 Family history of malignant neoplasm of breast: Secondary | ICD-10-CM | POA: Insufficient documentation

## 2017-08-10 DIAGNOSIS — I2583 Coronary atherosclerosis due to lipid rich plaque: Secondary | ICD-10-CM

## 2017-08-10 DIAGNOSIS — Z7982 Long term (current) use of aspirin: Secondary | ICD-10-CM | POA: Diagnosis not present

## 2017-08-10 DIAGNOSIS — I2510 Atherosclerotic heart disease of native coronary artery without angina pectoris: Secondary | ICD-10-CM | POA: Diagnosis not present

## 2017-08-10 DIAGNOSIS — Z87448 Personal history of other diseases of urinary system: Secondary | ICD-10-CM

## 2017-08-10 DIAGNOSIS — Z87442 Personal history of urinary calculi: Secondary | ICD-10-CM | POA: Insufficient documentation

## 2017-08-10 DIAGNOSIS — Z951 Presence of aortocoronary bypass graft: Secondary | ICD-10-CM | POA: Insufficient documentation

## 2017-08-10 DIAGNOSIS — Z79899 Other long term (current) drug therapy: Secondary | ICD-10-CM | POA: Diagnosis not present

## 2017-08-10 DIAGNOSIS — Z955 Presence of coronary angioplasty implant and graft: Secondary | ICD-10-CM | POA: Diagnosis not present

## 2017-08-10 DIAGNOSIS — Z888 Allergy status to other drugs, medicaments and biological substances status: Secondary | ICD-10-CM | POA: Diagnosis not present

## 2017-08-10 DIAGNOSIS — R55 Syncope and collapse: Secondary | ICD-10-CM | POA: Insufficient documentation

## 2017-08-10 DIAGNOSIS — Z9350 Unspecified cystostomy status: Secondary | ICD-10-CM | POA: Diagnosis not present

## 2017-08-10 DIAGNOSIS — I5022 Chronic systolic (congestive) heart failure: Secondary | ICD-10-CM

## 2017-08-10 DIAGNOSIS — I5023 Acute on chronic systolic (congestive) heart failure: Secondary | ICD-10-CM | POA: Insufficient documentation

## 2017-08-10 DIAGNOSIS — N35919 Unspecified urethral stricture, male, unspecified site: Secondary | ICD-10-CM | POA: Diagnosis not present

## 2017-08-10 LAB — BASIC METABOLIC PANEL
Anion gap: 10 (ref 5–15)
BUN: 23 mg/dL (ref 8–23)
CO2: 25 mmol/L (ref 22–32)
Calcium: 9.4 mg/dL (ref 8.9–10.3)
Chloride: 105 mmol/L (ref 98–111)
Creatinine, Ser: 1.02 mg/dL (ref 0.61–1.24)
GFR calc Af Amer: >60 mL/min (ref >60)
GFR calc non Af Amer: >60 mL/min (ref >60)
Glucose, Bld: 90 mg/dL (ref 70–99)
Potassium: 4.2 mmol/L (ref 3.5–5.1)
Sodium: 140 mmol/L (ref 135–145)

## 2017-08-10 NOTE — Patient Instructions (Addendum)
Routine lab work today. Will notify you of abnormal results, otherwise no news is good news!  STOP Losartan.  Follow u 2 weeks with CHF pharmacist.  Follow up 6-8 weeks Dr. Shirlee Latch.  Will schedule you for an echocardiogram at [facility].  Take all medication as prescribed the day of your appointment. Bring all medications with you to your appointment.  Do the following things EVERYDAY: 1) Weigh yourself in the morning before breakfast. Write it down and keep it in a log. 2) Take your medicines as prescribed 3) Eat low salt foods-Limit salt (sodium) to 2000 mg per day.  4) Stay as active as you can everyday 5) Limit all fluids for the day to less than 2 liters

## 2017-08-10 NOTE — Addendum Note (Signed)
Encounter addended by: Graciella Freer, PA-C on: 08/10/2017 10:25 AM  Actions taken: Sign clinical note

## 2017-08-17 ENCOUNTER — Other Ambulatory Visit: Payer: Self-pay | Admitting: Cardiothoracic Surgery

## 2017-08-17 DIAGNOSIS — Z951 Presence of aortocoronary bypass graft: Secondary | ICD-10-CM

## 2017-08-18 ENCOUNTER — Other Ambulatory Visit: Payer: Self-pay

## 2017-08-18 ENCOUNTER — Encounter: Payer: Self-pay | Admitting: Cardiothoracic Surgery

## 2017-08-18 ENCOUNTER — Ambulatory Visit
Admission: RE | Admit: 2017-08-18 | Discharge: 2017-08-18 | Disposition: A | Discharge status: Home / Self Care | Payer: BLUE CROSS/BLUE SHIELD | Source: Ambulatory Visit | Attending: Cardiothoracic Surgery | Admitting: Cardiothoracic Surgery

## 2017-08-18 ENCOUNTER — Ambulatory Visit (INDEPENDENT_AMBULATORY_CARE_PROVIDER_SITE_OTHER): Payer: BLUE CROSS/BLUE SHIELD | Admitting: Cardiothoracic Surgery

## 2017-08-18 VITALS — BP 122/75 | HR 79 | Resp 16 | Ht 74.0 in | Wt 214.0 lb

## 2017-08-18 DIAGNOSIS — Z951 Presence of aortocoronary bypass graft: Secondary | ICD-10-CM

## 2017-08-18 DIAGNOSIS — R0609 Other forms of dyspnea: Secondary | ICD-10-CM | POA: Diagnosis not present

## 2017-08-18 NOTE — Progress Notes (Signed)
PCP is Jarome Matin, MD Referring Provider is Jarome Matin, MD  Chief Complaint  Patient presents with  . Routine Post Op    4 wk f/u with a CXR ...s/p CABG X 4.Marland KitchenMarland Kitchen6/10/19   Schedule visit 2 months after urgent CABG x18 HPI: 62 year old male with ischemic cardiomyopathy had urgent CABG 2 months ago.  He required a suprapubic bladder catheter because of urethral strictures.  He had postoperative fluid retention-heart failure and arrhythmias and was seen by the advanced heart failure service.  Last week he was evaluated in follow-up at the heart failure clinic and found to be progressing very well.  He has a echocardiogram scheduled in 3 weeks and he is on medications approved by the advanced heart failure service.  He is walking at least a mile a day and also riding a stationary bike.  He has no symptoms of recurrent angina.  Surgical incisions are healing well and there is no edema.  Chest x-ray today shows clear lung fields no pleural effusion and sternal wires intact.  Patient's main complaint is chest wall neuritic type pain on the left greater than right sides.  This is slowly getting better and taking Neurontin also has helped him.  Patient is back working half days and reaching a high functional level.  He understands he cannot lift more than 20 pounds until mid September, 3 months after surgery. Past Medical History:  Diagnosis Date  . Arthritis    "mild; back, neck, spine" (06/09/2017)  . CHF (congestive heart failure) (HCC)   . Coronary artery disease   . History of gout   . History of kidney stones X 2  . Hypercholesterolemia   . Hypertension   . LBBB (left bundle branch block)   . MI (myocardial infarction) (HCC) 1993   LATERAL  . MI (myocardial infarction) (HCC) ?09/2001; 06/07/2017  . Pneumonia    "several times" (06/09/2017)  . S/P coronary artery stent placement    RCA  . Varicose veins     Past Surgical History:  Procedure Laterality Date  . ANTERIOR CERVICAL  DECOMP/DISCECTOMY FUSION  03/03/2011   Procedure: ANTERIOR CERVICAL DECOMPRESSION/DISCECTOMY FUSION 3 LEVELS;  Surgeon: Karn Cassis, MD;  Location: [facility] NEURO ORS;  Service: Neurosurgery;  Laterality: N/A;  Cervical three-four Cervical four-five Cervical five-six Anterior cervical decompression/diskectomy, fusion  . BACK SURGERY    . CARDIAC CATHETERIZATION  2009   Stents in RCA patent. 70 to 80% PL, and 50% ostial PD.   Marland Kitchen CARDIAC CATHETERIZATION N/A 11/20/2014   Procedure: Left Heart Cath and Coronary Angiography;  Surgeon: Shayma Pfefferle M Swaziland, MD;  Location: [facility] INVASIVE CV LAB;  Service: Cardiovascular;  Laterality: N/A;  . CORONARY ANGIOPLASTY     DIRECT ANGIOPLASTY THE MARGINAL BRANCH  . CORONARY ANGIOPLASTY WITH STENT PLACEMENT     RCA  . CORONARY ARTERY BYPASS GRAFT N/A 06/14/2017   Procedure: CORONARY ARTERY BYPASS GRAFTING (CABG) x four, using left internal mammary artery and right leg greater saphenous vein harvested endoscopically;  Surgeon: Kerin Perna, MD;  Location: [facility] OR;  Service: Open Heart Surgery;  Laterality: N/A;  . CYSTOSCOPY N/A 06/14/2017   Procedure: CYSTOSCOPY;  Surgeon: Donata Clay, Theron Arista, MD;  Location: [facility] OR;  Service: Open Heart Surgery;  Laterality: N/A;  . INSERTION OF SUPRAPUBIC CATHETER N/A 06/14/2017   Procedure: INSERTION OF SUPRAPUBIC CATHETER - Lower abdomen;  Surgeon: Kerin Perna, MD;  Location[facility] OR;  Service: Open Heart Surgery;  Laterality: N/A;  . POSTERIOR LUMBAR FUSION  10/2011  . RIGHT/LEFT HEART CATH AND CORONARY ANGIOGRAPHY N/A 06/08/2017   Procedure: RIGHT/LEFT HEART CATH AND CORONARY ANGIOGRAPHY;  Surgeon: Swaziland, Renessa Wellnitz M, MD;  Location: [facility] INVASIVE CV LAB;  Service: Cardiovascular;  Laterality: N/A;  . TEE WITHOUT CARDIOVERSION N/A 06/14/2017   Procedure: TRANSESOPHAGEAL ECHOCARDIOGRAM (TEE);  Surgeon: Donata Clay, Theron Arista, MD;  Location: [facility] OR;  Service: Open Heart Surgery;  Laterality: N/A;    Family History  Problem Relation Age of Onset  .  Heart attack Father   . Diabetes Father   . Breast cancer Mother   . Coronary artery disease Mother   . Heart disease Brother   . Heart attack Maternal Grandfather     Social History Social History   Tobacco Use  . Smoking status: Former Smoker    Packs/day: 2.00    Years: 20.00    Pack years: 40.00    Types: Cigarettes    Last attempt to quit: 05/07/1999    Years since quitting: 18.2  . Smokeless tobacco: Never Used  Substance Use Topics  . Alcohol use: Yes    Comment: 06/09/2017 "1 beer/month; at most"  . Drug use: Never    Current Outpatient Medications  Medication Sig Dispense Refill  . aspirin 81 MG chewable tablet Chew 81 mg by mouth daily.    . carvedilol (COREG) 6.25 MG tablet Take 1 tablet (6.25 mg total) by mouth 2 (two) times daily with a meal. 60 tablet 1  . Evolocumab (REPATHA SURECLICK) 140 MG/ML SOAJ Inject 140 mg into the skin every 14 (fourteen) days.     . fenofibrate (TRICOR) 145 MG tablet TAKE 1 TABLET BY MOUTH EVERY DAY 90 tablet 3  . gabapentin (NEURONTIN) 300 MG capsule Take 300 mg by mouth at bedtime.  2  . Multiple Vitamins-Minerals (MENS MULTIVITAMIN PLUS PO) Take 1 tablet by mouth 2 (two) times daily.    Marland Kitchen zolpidem (AMBIEN) 10 MG tablet Take 10 mg by mouth at bedtime.     No current facility-administered medications for this visit.     Allergies  Allergen Reactions  . Ezetimibe Other (See Comments)    Joint pain and drops BP  . Niacin Other (See Comments)    Drops BP and severe  . Statins Other (See Comments)    Severe joint pain and drops BP.    Review of Systems  Weight stable Overall strength improving No recurrent symptoms of angina  BP 122/75 (BP Location: Right Arm, Patient Position: Sitting, Cuff Size: Large)   Pulse 79   Resp 16   Ht 6\' 2"  (1.88 m)   Wt 214 lb (97.1 kg)   SpO2 94% Comment: ON RA  BMI 27.48 kg/m  Physical Exam      Exam    General- alert and comfortable    Neck- no JVD, no cervical adenopathy palpable,  no carotid bruit   Lungs- clear without rales, wheezes   Cor- regular rate and rhythm, no murmur , gallop   Abdomen- soft, non-tender   Extremities - warm, non-tender, minimal edema   Neuro- oriented, appropriate, no focal weakness   Diagnostic Tests: Chest x-ray clear  Impression: Recovering after urgent CABG x4. He has been cleared for urologic surgery by cardiology From my point of view he can have urologic surgery anytime after mid-September. Patient will continue his current medications and follow-up with the advanced heart failure service. He plans on starting cardiac rehab after the suprapubic tube has been removed. He will return here as needed.  Plan:   Mikey Bussing, MD [facility] [phone]

## 2017-08-20 ENCOUNTER — Telehealth (HOSPITAL_COMMUNITY): Payer: Self-pay

## 2017-08-20 NOTE — Telephone Encounter (Signed)
Called and spoke with patient to see if f/u appt has been scheduled to get catheter removed - patient stated that he will be getting catheter removed in mid October not September. Follow up appt has not been scheduled at this time. Patient stated he was on the phone yesterday trying to schedule f/u appt. He stated the Dr office will call back to get him on the schedule. Will follow up with patient next week.

## 2017-08-24 ENCOUNTER — Other Ambulatory Visit: Payer: Self-pay | Admitting: Urology

## 2017-08-24 ENCOUNTER — Encounter (HOSPITAL_COMMUNITY): Payer: Self-pay

## 2017-08-24 ENCOUNTER — Ambulatory Visit (HOSPITAL_COMMUNITY)
Admission: RE | Admit: 2017-08-24 | Discharge: 2017-08-24 | Disposition: A | Discharge status: Home / Self Care | Payer: BLUE CROSS/BLUE SHIELD | Source: Ambulatory Visit | Attending: Internal Medicine | Admitting: Internal Medicine

## 2017-08-24 DIAGNOSIS — N182 Chronic kidney disease, stage 2 (mild): Secondary | ICD-10-CM | POA: Insufficient documentation

## 2017-08-24 DIAGNOSIS — I5022 Chronic systolic (congestive) heart failure: Secondary | ICD-10-CM | POA: Insufficient documentation

## 2017-08-24 DIAGNOSIS — E785 Hyperlipidemia, unspecified: Secondary | ICD-10-CM | POA: Diagnosis not present

## 2017-08-24 DIAGNOSIS — R55 Syncope and collapse: Secondary | ICD-10-CM | POA: Insufficient documentation

## 2017-08-24 DIAGNOSIS — N35919 Unspecified urethral stricture, male, unspecified site: Secondary | ICD-10-CM | POA: Insufficient documentation

## 2017-08-24 DIAGNOSIS — I130 Hypertensive heart and chronic kidney disease with heart failure and stage 1 through stage 4 chronic kidney disease, or unspecified chronic kidney disease: Secondary | ICD-10-CM | POA: Insufficient documentation

## 2017-08-24 DIAGNOSIS — M7989 Other specified soft tissue disorders: Secondary | ICD-10-CM | POA: Insufficient documentation

## 2017-08-24 DIAGNOSIS — Z951 Presence of aortocoronary bypass graft: Secondary | ICD-10-CM | POA: Insufficient documentation

## 2017-08-24 DIAGNOSIS — I2510 Atherosclerotic heart disease of native coronary artery without angina pectoris: Secondary | ICD-10-CM | POA: Insufficient documentation

## 2017-08-24 DIAGNOSIS — R0609 Other forms of dyspnea: Secondary | ICD-10-CM | POA: Diagnosis not present

## 2017-08-24 MED ORDER — LISINOPRIL 2.5 MG PO TABS: 2.5000 mg | ORAL_TABLET | Freq: Every day | ORAL | 5 refills | Status: DC

## 2017-08-24 NOTE — Patient Instructions (Addendum)
It was great to meet you today!  Please START lisinopril 2.5 mg (1 tablet) DAILY.   You are scheduled with the pharmacist again on 8/29 at 11:00 am.   Please keep your echocardiogram appointment on 9/10 and your appointment with Dr. Shirlee Latch on 10/1.

## 2017-08-24 NOTE — Progress Notes (Signed)
HF MD: [facility]  HPI:  [name] is a 62 y.o. male with CAD s/p CABG x 4 06/14/17, HLD, HTN, chronic systolic CHF, CKD II, and urethral stricture s/p suprapubic catheterization.   Admitted 6/18 - 06/25/17 after syncope in urology office s/p CABG as above. With low EF, concern for VT/VF and LBB, Lifevest ordered. Meds cut back on day of discharge due to mild hypotension and orthostasis.   He presents today for pharmacist-led HF medication titration. At last HF clinic visit on 8/6, his losartan 12.5 mg QHS was d/c'd d/t orthostasis. Feeling much better since losartan was d/c'd. No longer having symptoms of orthostasis.     . Shortness of breath/dyspnea on exertion? no  . Orthopnea/PND? no . Edema? no . Lightheadedness/dizziness? no . Daily weights at home? Yes - 214-216 lb . Blood pressure/heart rate monitoring at home? no . Following low-sodium/fluid-restricted diet? Yes   HF Medications: Carvedilol 6.25 mg PO BID  Has the patient been experiencing any side effects to the medications prescribed?  no  Does the patient have any problems obtaining medications due to transportation or finances?   No - BCBS [place] commercial  Understanding of regimen: good Understanding of indications: good Potential of compliance: good Patient understands to avoid NSAIDs. Patient understands to avoid decongestants.    Pertinent Lab Values: . 08/10/17: Serum creatinine 1.02, BUN 23, Potassium 4.2, Sodium 140  Vital Signs: . Weight: 219 lb (dry weight: 214-218 lb) . Blood pressure: 142/90 mmHg  . Heart rate: 85 bpm   Assessment: 1. Chronicsystolic CHF (EF 14-78%), due to ICM. NYHA class II-IIIsymptoms.  - Volume status stable  - Start lisinopril 2.5 mg daily cautiously with h/o orthostasis on losartan 12.5 mg daily (patient states that he tolerated this dose of lisinopril in the past and wasn't sure why it was d/c'd) - Continue carvedilol 6.25 mg BID - Basic disease state pathophysiology,  medication indication, mechanism and side effects reviewed at length with patient and he verbalized understanding  2. Syncope: Unclear cause.  - With low EF, ventricular arrhythmia is certainly a concern. Also, with LBBB, progressive conduction disease with bradyarrhythmia is a possibility. Certainly could have been a vagal event, but he had no trigger (was not in pain, just sitting in urology office prior to his appt).  - Telemetry with nonsustained WCT during recent admission. Has baseline LBBB and did not appear to change axis. Unclear if SVT with aberrancy or VT.  -LifeVest in place. Echo next month.   3. Unilateral leg swelling and dyspnea:  - CTA chest 06/2017 negative for PE.  - Venous dopplers negative for DVT. - Resolved.   4. CAD: S/p CABG 06/2017 - No s/s of ischemia.    - Continue ASA.  - Cannot tolerate statins or Zetia. Continue Repatha.   5. Urethral stricture with suprapubic catheter: - Per Urology.  - Cardiac clearance is granted. He is at least moderate risk of peri-operative cardiac complications with h/o CAD and CHF. Should be done in hospital.  Spoke with Dr. Shirlee Latch who agrees.    6. Orthostasis - Resolved off of losartan - Starting low dose lisinopril today which he has tolerated in the past  Plan: 1) Medication changes: Based on clinical presentation, vital signs and recent labs will start lisinopril 2.5 mg daily 2) Labs: BMET in 1-2 weeks  3) Follow-up: Pharmacy visit on 8/29, echo on 9/10 and Dr. Shirlee Latch on 10/1   [name]. [name], PharmD, BCPS, CPP Clinical Pharmacist Phone: [phone] 08/24/2017  8:57 AM

## 2017-08-25 DIAGNOSIS — I214 Non-ST elevation (NSTEMI) myocardial infarction: Secondary | ICD-10-CM | POA: Diagnosis not present

## 2017-08-25 DIAGNOSIS — I255 Ischemic cardiomyopathy: Secondary | ICD-10-CM | POA: Diagnosis not present

## 2017-08-25 DIAGNOSIS — Z951 Presence of aortocoronary bypass graft: Secondary | ICD-10-CM | POA: Diagnosis not present

## 2017-08-25 DIAGNOSIS — I472 Ventricular tachycardia: Secondary | ICD-10-CM | POA: Diagnosis not present

## 2017-09-02 ENCOUNTER — Ambulatory Visit (HOSPITAL_COMMUNITY)
Admission: RE | Admit: 2017-09-02 | Discharge: 2017-09-02 | Disposition: A | Discharge status: Home / Self Care | Payer: BLUE CROSS/BLUE SHIELD | Source: Ambulatory Visit | Attending: Internal Medicine | Admitting: Internal Medicine

## 2017-09-02 VITALS — BP 148/86 | HR 77 | Wt 222.0 lb

## 2017-09-02 DIAGNOSIS — I130 Hypertensive heart and chronic kidney disease with heart failure and stage 1 through stage 4 chronic kidney disease, or unspecified chronic kidney disease: Secondary | ICD-10-CM | POA: Diagnosis not present

## 2017-09-02 DIAGNOSIS — Z951 Presence of aortocoronary bypass graft: Secondary | ICD-10-CM | POA: Insufficient documentation

## 2017-09-02 DIAGNOSIS — R0600 Dyspnea, unspecified: Secondary | ICD-10-CM | POA: Diagnosis not present

## 2017-09-02 DIAGNOSIS — N182 Chronic kidney disease, stage 2 (mild): Secondary | ICD-10-CM | POA: Insufficient documentation

## 2017-09-02 DIAGNOSIS — I2510 Atherosclerotic heart disease of native coronary artery without angina pectoris: Secondary | ICD-10-CM | POA: Insufficient documentation

## 2017-09-02 DIAGNOSIS — I5022 Chronic systolic (congestive) heart failure: Secondary | ICD-10-CM | POA: Diagnosis not present

## 2017-09-02 DIAGNOSIS — E785 Hyperlipidemia, unspecified: Secondary | ICD-10-CM | POA: Insufficient documentation

## 2017-09-02 DIAGNOSIS — N35919 Unspecified urethral stricture, male, unspecified site: Secondary | ICD-10-CM | POA: Insufficient documentation

## 2017-09-02 DIAGNOSIS — M7989 Other specified soft tissue disorders: Secondary | ICD-10-CM | POA: Diagnosis not present

## 2017-09-02 DIAGNOSIS — R55 Syncope and collapse: Secondary | ICD-10-CM | POA: Diagnosis not present

## 2017-09-02 LAB — BASIC METABOLIC PANEL
Anion gap: 8 (ref 5–15)
BUN: 21 mg/dL (ref 8–23)
CO2: 25 mmol/L (ref 22–32)
Calcium: 9.5 mg/dL (ref 8.9–10.3)
Chloride: 106 mmol/L (ref 98–111)
Creatinine, Ser: 1.03 mg/dL (ref 0.61–1.24)
GFR calc Af Amer: >60 mL/min (ref >60)
GFR calc non Af Amer: >60 mL/min (ref >60)
Glucose, Bld: 99 mg/dL (ref 70–99)
Potassium: 4.3 mmol/L (ref 3.5–5.1)
Sodium: 139 mmol/L (ref 135–145)

## 2017-09-02 MED ORDER — CARVEDILOL 6.25 MG PO TABS: 9.3750 mg | ORAL_TABLET | Freq: Two times a day (BID) | ORAL | 3 refills | Status: DC

## 2017-09-02 NOTE — Progress Notes (Signed)
HF MD: [facility]  HPI:  [name] a 62 y.o.malewith CAD s/p CABG x 4 06/14/17, HLD, HTN, chronic systolic CHF, CKD II, and urethral stricture s/p suprapubic catheterization.   Admitted 6/18 - 06/25/17 after syncope in urology office s/p CABG as above. With low EF, concern for VT/VF and LBB, Lifevest ordered. Meds cut back on day of discharge due to mild hypotension and orthostasis.  He presents today for pharmacist-led HF medication titration.At last pharmacy HF clinic visit on 8/20, he was started on lisinopril 2.5 mg daily. Feels well on lisinopril, no symptoms of orthostasis. He has been taking Aleve for headaches almost daily. He is exercising at least 4 days a week (bikes for an hour without SOB, walks and lifts 10 lb dumbells).     Shortness of breath/dyspnea on exertion? no   Orthopnea/PND? no  Edema? no  Lightheadedness/dizziness? no  Daily weights at home? Yes - 214-216 lb  Blood pressure/heart rate monitoring at home? no  Following low-sodium/fluid-restricted diet? Yes   HF Medications: Carvedilol 6.25 mg PO BID Lisinopril 2.5 mg PO daily   Has the patient been experiencing any side effects to the medications prescribed?  no  Does the patient have any problems obtaining medications due to transportation or finances?   No - BCBS [place] commercial  Understanding of regimen: good Understanding of indications: good Potential of compliance: good Patient understands to avoid NSAIDs. Patient understands to avoid decongestants.   Pertinent Lab Values:  09/02/17: Serum creatinine 1.03, BUN 21, Potassium 4.3, Sodium 139  Vital Signs:  Weight: 222 lb (dry weight: 214-218 lb)  Blood pressure: 148/86 mmHg   Heart rate: 77 bpm   Assessment: 1. Chronicsystolic CHF (EF 63-78%), due to ICM. NYHA class II-IIIsymptoms.  - Volume status stable  - Increase carvedilol to 9.375 mg BID   - Continue lisinopril 2.5 mg daily (may not be able to increase dose with  previous orthostasis) - Basic disease state pathophysiology, medication indication, mechanism and side effects reviewed at length with patient and he verbalized understanding  2. Syncope: Unclear cause.  - With low EF, ventricular arrhythmia is certainly a concern. Also, with LBBB, progressive conduction disease with bradyarrhythmia is a possibility. Certainly could have been a vagal event, but he had no trigger (was not in pain, just sitting in urology office prior to his appt).  - Telemetry with nonsustained WCT during recent admission. Has baseline LBBB and did not appear to change axis. Unclear if SVT with aberrancy or VT.  -LifeVest in place.Echo next month.  3. Unilateral leg swelling and dyspnea:  - CTA chest 06/2017 negative for PE.  - Venous dopplers negative for DVT. - Resolved.  4. CAD: S/p CABG 06/2017 -No s/s of ischemia. - Continue ASA.  - Cannot tolerate statins or Zetia. Continue Repatha.   5. Urethral stricture with suprapubic catheter: -Per Urology. - Cardiac clearanceis granted. He is at least moderate risk of peri-operative cardiac complications with h/o CAD and CHF. Should be done in hospital. Spoke with Dr. Julien Nordmann agrees.    6. Orthostasis - Resolved off of losartan - Started low dose lisinopril recently which he has tolerated in the past  Plan: 1) Medication changes: Based on clinical presentation, vital signs and recent labs will increase carvedilol to 9.375 mg BID 2) Labs: BMET today 3) Follow-up: Echo on 9/10 and Dr. Shirlee Latch on 10/1   [name], PharmD, BCPS, CPP Clinical Pharmacist Phone: [phone] 08/24/2017 8:57 AM

## 2017-09-02 NOTE — Patient Instructions (Addendum)
Please INCREASE your carvedilol 9.375 mg (1 and 1/2 tablets) TWICE DAILY.   Blood work today. We will call you with any changes.   Please keep your appointment for an echocardiogram on 9/10 and Dr. Shirlee Latch on 10/1.

## 2017-09-08 ENCOUNTER — Telehealth (HOSPITAL_COMMUNITY): Payer: Self-pay | Admitting: Cardiology

## 2017-09-08 MED ORDER — CARVEDILOL 6.25 MG PO TABS: 6.2500 mg | ORAL_TABLET | Freq: Two times a day (BID) | ORAL | 3 refills | Status: DC

## 2017-09-08 NOTE — Telephone Encounter (Signed)
Patient reports since increasing coreg at last OV he has been having increased dizziness and blurred vision. Currently coreg 9.375 bid  Advised patient to decrease back to 6.25 BID and be sure to call or follow up is symptoms do not improve  Verbally discussed with Cicero Duck PharmD

## 2017-09-13 ENCOUNTER — Other Ambulatory Visit (HOSPITAL_COMMUNITY): Payer: Self-pay | Admitting: Student

## 2017-09-14 ENCOUNTER — Ambulatory Visit (HOSPITAL_COMMUNITY)
Admission: RE | Admit: 2017-09-14 | Discharge: 2017-09-14 | Disposition: A | Discharge status: Home / Self Care | Payer: BLUE CROSS/BLUE SHIELD | Source: Ambulatory Visit | Attending: Cardiology | Admitting: Cardiology

## 2017-09-14 DIAGNOSIS — I5022 Chronic systolic (congestive) heart failure: Secondary | ICD-10-CM | POA: Diagnosis not present

## 2017-09-14 DIAGNOSIS — I2510 Atherosclerotic heart disease of native coronary artery without angina pectoris: Secondary | ICD-10-CM | POA: Insufficient documentation

## 2017-09-14 DIAGNOSIS — I351 Nonrheumatic aortic (valve) insufficiency: Secondary | ICD-10-CM | POA: Diagnosis not present

## 2017-09-14 NOTE — Progress Notes (Signed)
  Echocardiogram 2D Echocardiogram has been performed.  [name] 09/14/2017, 10:41 AM

## 2017-09-20 DIAGNOSIS — N35011 Post-traumatic bulbous urethral stricture: Secondary | ICD-10-CM | POA: Diagnosis not present

## 2017-09-20 DIAGNOSIS — B9689 Other specified bacterial agents as the cause of diseases classified elsewhere: Secondary | ICD-10-CM | POA: Diagnosis not present

## 2017-09-20 DIAGNOSIS — B961 Klebsiella pneumoniae [K. pneumoniae] as the cause of diseases classified elsewhere: Secondary | ICD-10-CM | POA: Diagnosis not present

## 2017-09-20 DIAGNOSIS — R3915 Urgency of urination: Secondary | ICD-10-CM | POA: Diagnosis not present

## 2017-09-20 DIAGNOSIS — N390 Urinary tract infection, site not specified: Secondary | ICD-10-CM | POA: Diagnosis not present

## 2017-09-25 DIAGNOSIS — I214 Non-ST elevation (NSTEMI) myocardial infarction: Secondary | ICD-10-CM | POA: Diagnosis not present

## 2017-09-25 DIAGNOSIS — I255 Ischemic cardiomyopathy: Secondary | ICD-10-CM | POA: Diagnosis not present

## 2017-09-25 DIAGNOSIS — I472 Ventricular tachycardia: Secondary | ICD-10-CM | POA: Diagnosis not present

## 2017-09-25 DIAGNOSIS — Z951 Presence of aortocoronary bypass graft: Secondary | ICD-10-CM | POA: Diagnosis not present

## 2017-09-28 ENCOUNTER — Telehealth (HOSPITAL_COMMUNITY): Payer: Self-pay | Admitting: Surgery

## 2017-09-28 NOTE — Telephone Encounter (Signed)
I called [name] in order to reschedule his appt in AHF clinic on October 1.  He was agreeable to move appt to October 8th at 3:20pm.

## 2017-10-05 ENCOUNTER — Encounter (HOSPITAL_COMMUNITY): Payer: BLUE CROSS/BLUE SHIELD | Admitting: Cardiology

## 2017-10-06 NOTE — Progress Notes (Signed)
08/10/2017- Cardiac Clearance from Maxine Glenn, PA - CHF clinic on chart and in Epic  09/14/2017- noted in Epic-ECHO  06/22/2017- noted in Epic-EKG  08/18/2017- noted in Epic-CXR  06/14/2017- noted in Epic-CABG surgery  06/08/2017- Right/Left Heart cath noted in Epic  02/14/2016- noted in Epic-Stress test

## 2017-10-06 NOTE — Patient Instructions (Addendum)
CLEVER GERALDO  10/06/2017   Your procedure is scheduled on: Friday 10/15/2017  Report to [facility] Main  Entrance             Report to admitting at   0700 AM    Call this number if you have problems the morning of surgery [phone]    Remember: Do not eat food or drink liquids :After Midnight.               BRUSH YOUR TEETH MORNING OF SURGERY AND RINSE YOUR MOUTH OUT, NO CHEWING GUM CANDY OR MINTS.     Take these medicines the morning of surgery with A SIP OF WATER: Carvedilol (Coreg), Fenofibrate (Tricor)                                You may not have any metal on your body including hair pins and              piercings  Do not wear jewelry, make-up, lotions, powders or perfumes, deodorant                       Men  may shave face and neck.   Do not bring valuables to the hospital. [facility] IS NOT             RESPONSIBLE   FOR VALUABLES.  Contacts, dentures or bridgework may not be worn into surgery.  Leave suitcase in the car. After surgery it may be brought to your room.                  Please read over the following fact sheets you were given: _____________________________________________________________________             [facility] - Preparing for Surgery Before surgery, you can play an important role.  Because skin is not sterile, your skin needs to be as free of germs as possible.  You can reduce the number of germs on your skin by washing with CHG (chlorahexidine gluconate) soap before surgery.  CHG is an antiseptic cleaner which kills germs and bonds with the skin to continue killing germs even after washing. Please DO NOT use if you have an allergy to CHG or antibacterial soaps.  If your skin becomes reddened/irritated stop using the CHG and inform your nurse when you arrive at Short Stay. Do not shave (including legs and underarms) for at least 48 hours prior to the first CHG shower.  You may shave your face/neck. Please follow  these instructions carefully:  1.  Shower with CHG Soap the night before surgery and the  morning of Surgery.  2.  If you choose to wash your hair, wash your hair first as usual with your  normal  shampoo.  3.  After you shampoo, rinse your hair and body thoroughly to remove the  shampoo.                           4.  Use CHG as you would any other liquid soap.  You can apply chg directly  to the skin and wash                       Gently with a scrungie or clean washcloth.  5.  Apply the CHG Soap to your body ONLY FROM THE NECK DOWN.   Do not use on face/ open                           Wound or open sores. Avoid contact with eyes, ears mouth and genitals (private parts).                       Wash face,  Genitals (private parts) with your normal soap.             6.  Wash thoroughly, paying special attention to the area where your surgery  will be performed.  7.  Thoroughly rinse your body with warm water from the neck down.  8.  DO NOT shower/wash with your normal soap after using and rinsing off  the CHG Soap.                9.  Pat yourself dry with a clean towel.            10.  Wear clean pajamas.            11.  Place clean sheets on your bed the night of your first shower and do not  sleep with pets. Day of Surgery : Do not apply any lotions/deodorants the morning of surgery.  Please wear clean clothes to the hospital/surgery center.  FAILURE TO FOLLOW THESE INSTRUCTIONS MAY RESULT IN THE CANCELLATION OF YOUR SURGERY PATIENT SIGNATURE_________________________________  NURSE SIGNATURE__________________________________

## 2017-10-07 DIAGNOSIS — N35011 Post-traumatic bulbous urethral stricture: Secondary | ICD-10-CM | POA: Diagnosis not present

## 2017-10-07 DIAGNOSIS — N390 Urinary tract infection, site not specified: Secondary | ICD-10-CM | POA: Diagnosis not present

## 2017-10-07 DIAGNOSIS — B9689 Other specified bacterial agents as the cause of diseases classified elsewhere: Secondary | ICD-10-CM | POA: Diagnosis not present

## 2017-10-08 ENCOUNTER — Inpatient Hospital Stay (HOSPITAL_COMMUNITY): Admission: RE | Admit: 2017-10-08 | Payer: BLUE CROSS/BLUE SHIELD | Source: Ambulatory Visit

## 2017-10-11 ENCOUNTER — Encounter (HOSPITAL_COMMUNITY)
Admission: RE | Admit: 2017-10-11 | Discharge: 2017-10-11 | Disposition: A | Discharge status: Home / Self Care | Payer: BLUE CROSS/BLUE SHIELD | Source: Ambulatory Visit | Attending: Urology | Admitting: Urology

## 2017-10-11 ENCOUNTER — Other Ambulatory Visit: Payer: Self-pay

## 2017-10-11 ENCOUNTER — Encounter (HOSPITAL_COMMUNITY): Payer: Self-pay

## 2017-10-11 DIAGNOSIS — Z01812 Encounter for preprocedural laboratory examination: Secondary | ICD-10-CM | POA: Diagnosis not present

## 2017-10-11 DIAGNOSIS — N35919 Unspecified urethral stricture, male, unspecified site: Secondary | ICD-10-CM | POA: Diagnosis not present

## 2017-10-11 LAB — COMPREHENSIVE METABOLIC PANEL
ALT: 19 U/L (ref 0–44)
AST: 24 U/L (ref 15–41)
Albumin: 4 g/dL (ref 3.5–5.0)
Alkaline Phosphatase: 37 U/L — ABNORMAL LOW (ref 38–126)
Anion gap: 6 (ref 5–15)
BUN: 23 mg/dL (ref 8–23)
CO2: 27 mmol/L (ref 22–32)
Calcium: 9.7 mg/dL (ref 8.9–10.3)
Chloride: 110 mmol/L (ref 98–111)
Creatinine, Ser: 1 mg/dL (ref 0.61–1.24)
GFR calc Af Amer: >60 mL/min (ref >60)
GFR calc non Af Amer: >60 mL/min (ref >60)
Glucose, Bld: 95 mg/dL (ref 70–99)
Potassium: 4.4 mmol/L (ref 3.5–5.1)
Sodium: 143 mmol/L (ref 135–145)
Total Bilirubin: 0.7 mg/dL (ref 0.3–1.2)
Total Protein: 7.3 g/dL (ref 6.5–8.1)

## 2017-10-11 LAB — CBC
HCT: 43.5 % (ref 39.0–52.0)
Hemoglobin: 14 g/dL (ref 13.0–17.0)
MCH: 27.9 pg (ref 26.0–34.0)
MCHC: 32.2 g/dL (ref 30.0–36.0)
MCV: 86.7 fL (ref 78.0–100.0)
Platelets: 279 10*3/uL (ref 150–400)
RBC: 5.02 MIL/uL (ref 4.22–5.81)
RDW: 18.7 % — ABNORMAL HIGH (ref 11.5–15.5)
WBC: 5.3 10*3/uL (ref 4.0–10.5)

## 2017-10-11 LAB — ABO/RH: ABO/RH(D): AB POS

## 2017-10-11 NOTE — Progress Notes (Signed)
   10/11/17 0808  OBSTRUCTIVE SLEEP APNEA  Have you ever been diagnosed with sleep apnea through a sleep study? No  Do you snore loudly (loud enough to be heard through closed doors)?  1  Do you often feel tired, fatigued, or sleepy during the daytime (such as falling asleep during driving or talking to someone)? 1  Has anyone observed you stop breathing during your sleep? 0  Do you have, or are you being treated for high blood pressure? 1  BMI more than 35 kg/m2? 0  Age > 50 (1-yes) 1  Neck circumference greater than:Male 16 inches or larger, Male 17inches or larger? 0  Male Gender (Yes=1) 1  Obstructive Sleep Apnea Score 5  Score 5 or greater  Results sent to PCP

## 2017-10-12 ENCOUNTER — Ambulatory Visit (HOSPITAL_COMMUNITY)
Admission: RE | Admit: 2017-10-12 | Discharge: 2017-10-12 | Disposition: A | Discharge status: Home / Self Care | Payer: BLUE CROSS/BLUE SHIELD | Source: Ambulatory Visit | Attending: Cardiology | Admitting: Cardiology

## 2017-10-12 ENCOUNTER — Other Ambulatory Visit: Payer: Self-pay

## 2017-10-12 VITALS — BP 144/75 | HR 69 | Wt 223.2 lb

## 2017-10-12 DIAGNOSIS — R55 Syncope and collapse: Secondary | ICD-10-CM | POA: Insufficient documentation

## 2017-10-12 DIAGNOSIS — Z8249 Family history of ischemic heart disease and other diseases of the circulatory system: Secondary | ICD-10-CM | POA: Diagnosis not present

## 2017-10-12 DIAGNOSIS — I2583 Coronary atherosclerosis due to lipid rich plaque: Secondary | ICD-10-CM

## 2017-10-12 DIAGNOSIS — E7800 Pure hypercholesterolemia, unspecified: Secondary | ICD-10-CM

## 2017-10-12 DIAGNOSIS — Z951 Presence of aortocoronary bypass graft: Secondary | ICD-10-CM | POA: Insufficient documentation

## 2017-10-12 DIAGNOSIS — I447 Left bundle-branch block, unspecified: Secondary | ICD-10-CM | POA: Diagnosis not present

## 2017-10-12 DIAGNOSIS — N182 Chronic kidney disease, stage 2 (mild): Secondary | ICD-10-CM | POA: Insufficient documentation

## 2017-10-12 DIAGNOSIS — Z79899 Other long term (current) drug therapy: Secondary | ICD-10-CM | POA: Diagnosis not present

## 2017-10-12 DIAGNOSIS — Z87891 Personal history of nicotine dependence: Secondary | ICD-10-CM | POA: Insufficient documentation

## 2017-10-12 DIAGNOSIS — I130 Hypertensive heart and chronic kidney disease with heart failure and stage 1 through stage 4 chronic kidney disease, or unspecified chronic kidney disease: Secondary | ICD-10-CM | POA: Diagnosis not present

## 2017-10-12 DIAGNOSIS — M109 Gout, unspecified: Secondary | ICD-10-CM | POA: Diagnosis not present

## 2017-10-12 DIAGNOSIS — Z7982 Long term (current) use of aspirin: Secondary | ICD-10-CM | POA: Insufficient documentation

## 2017-10-12 DIAGNOSIS — I5022 Chronic systolic (congestive) heart failure: Secondary | ICD-10-CM

## 2017-10-12 DIAGNOSIS — I2510 Atherosclerotic heart disease of native coronary artery without angina pectoris: Secondary | ICD-10-CM | POA: Diagnosis not present

## 2017-10-12 DIAGNOSIS — E785 Hyperlipidemia, unspecified: Secondary | ICD-10-CM | POA: Diagnosis not present

## 2017-10-12 DIAGNOSIS — N35919 Unspecified urethral stricture, male, unspecified site: Secondary | ICD-10-CM | POA: Insufficient documentation

## 2017-10-12 DIAGNOSIS — I255 Ischemic cardiomyopathy: Secondary | ICD-10-CM | POA: Insufficient documentation

## 2017-10-12 MED ORDER — SPIRONOLACTONE 25 MG PO TABS: 12.5000 mg | ORAL_TABLET | Freq: Every day | ORAL | 6 refills | Status: DC

## 2017-10-12 NOTE — Progress Notes (Signed)
Routed urine culture result dated 10-11-2017 to dr Marlou Porch in epic.

## 2017-10-12 NOTE — Patient Instructions (Addendum)
Start Spironolactone 12.5 mg (1/2 tab)   Labs in 2 weeks  We will contact you in 5 months to schedule your next appointment.

## 2017-10-13 LAB — URINE CULTURE: Culture: >=100000 — AB

## 2017-10-13 NOTE — Progress Notes (Signed)
Advanced Heart Failure Clinic Note   PCP: Jarome Matin, MD PCP-Cardiologist: Peter Swaziland, MD  HF Cardiology: Dr. Shirlee Latch   HPI:  [name] is a 62 y.o. male with CAD s/p CABG x 4 06/14/17, HLD, HTN, chronic systolic CHF, CKD II, and urethral stricture s/p suprapubic catheterization.   Pt admitted from 6/3 - 06/21/17. Presented to [facility] with flash pulmonary edema. Ruled in for NSTEMI with peak trop I of 0.28. Taken for cath which showed severe 3v CAD. Echo with EF 20-25%. TCTS saw in consultation and proceeded with CABG x 4 06/14/17.  Admitted 6/18 - 06/25/17 after syncope in urology office s/p CABG as above. WCT noted on telemetry.  With low EF, concern for VT/VF and LBBB, Lifevest ordered. Meds cut back on day of discharge due to mild hypotension and orthostasis.   Repeat echo in 9/19 showed persistently low EF, 25-30%.    He presents today for followup of CHF and CAD.  He has had a difficult time taking cardiac medications due to orthostatic symptoms/lightheadedness.  He did not tolerate Entresto and has been unable to increase Coreg from 6.25 mg bid.  He still has some surgical site soreness in his chest.  He has not had exertional chest pain. He says that he has been doing very well symptomatically.  He is back at work.  He exercises: rides bike, walks.  Only limitation is hip and back pain.  No dyspnea.  No orthopnea/PND.  He is wearing a Lifevest.   He still has the suprapubic catheter, sees urology Friday.   Labs (10/19): K 4.4, creatinine 1.0  ECG (personally reviewed): NSR, 1st degree AVB, LBBB 196 msec.   Past Medical History 1. Syncope: 6/19.  Telemetry with nonsustained WCT during admission, has baseline LBBB and did not appear to change axis. Unclear if SVT with aberrancy or VT.  2. Chronic systolic CHF: Ischemic cardiomyopathy.  - Echo 06/09/17 EF 20-25%.  - Echo 06/22/17 EF 30-35%.  - Echo 9/19 EF 25-30%, severe LV dilation, mild AI.  3. CAD: S/p CABG 06/2017 with  LIMA-LAD, SVG-D, SVG-ramus, SVG-RCA.  4. Hyperlipidemia: Cannot tolerate statins or Zetia. 5. Urethral stricture with suprapubic catheter: 6. Gout 7. Nephrolithiasis 8. Chronic LBBB: LV epicardial lead was placed at the time of CABG.   Review of systems complete and found to be negative unless listed in HPI.    Current Outpatient Medications  Medication Sig Dispense Refill  . aspirin 81 MG chewable tablet Chew 81 mg by mouth daily.    . carvedilol (COREG) 6.25 MG tablet Take 1 tablet (6.25 mg total) by mouth 2 (two) times daily. 180 tablet 3  . ciprofloxacin (CIPRO) 500 MG tablet Take 500 mg by mouth 2 (two) times daily.    . Evolocumab (REPATHA SURECLICK) 140 MG/ML SOAJ Inject 140 mg into the skin every 14 (fourteen) days.     . fenofibrate (TRICOR) 145 MG tablet TAKE 1 TABLET BY MOUTH EVERY DAY (Patient taking differently: Take 145 mg by mouth daily. ) 90 tablet 3  . gabapentin (NEURONTIN) 300 MG capsule Take 300 mg by mouth at bedtime.  2  . lisinopril (PRINIVIL,ZESTRIL) 2.5 MG tablet Take 1 tablet (2.5 mg total) by mouth daily. 30 tablet 5  . loratadine (CLARITIN) 10 MG tablet Take 10 mg by mouth daily as needed for allergies.     . mirabegron ER (MYRBETRIQ) 50 MG TB24 tablet Take 50 mg by mouth daily.    . Multiple Vitamins-Minerals (MENS MULTIVITAMIN PLUS  PO) Take 1 tablet by mouth 2 (two) times daily.    Marland Kitchen zolpidem (AMBIEN) 10 MG tablet Take 10 mg by mouth at bedtime.    Marland Kitchen spironolactone (ALDACTONE) 25 MG tablet Take 0.5 tablets (12.5 mg total) by mouth daily. 15 tablet 6   No current facility-administered medications for this encounter.     Allergies  Allergen Reactions  . Ezetimibe Other (See Comments)    Joint pain and drops BP  . Niacin Other (See Comments)    Drops BP and severe  . Statins Other (See Comments)    Severe joint pain and drops BP.      Social History   Socioeconomic History  . Marital status: Married    Spouse name: Not on file  . Number of  children: 1  . Years of education: Not on file  . Highest education level: Not on file  Occupational History  . Occupation: Truck Chief Strategy Officer  . Financial resource strain: Not on file  . Food insecurity:    Worry: Not on file    Inability: Not on file  . Transportation needs:    Medical: Not on file    Non-medical: Not on file  Tobacco Use  . Smoking status: Former Smoker    Packs/day: 2.00    Years: 20.00    Pack years: 40.00    Types: Cigarettes    Last attempt to quit: 05/07/1999    Years since quitting: 18.4  . Smokeless tobacco: Never Used  Substance and Sexual Activity  . Alcohol use: Yes    Comment: 06/09/2017 "1 beer/month; at most"  . Drug use: Never  . Sexual activity: Not on file  Lifestyle  . Physical activity:    Days per week: Not on file    Minutes per session: Not on file  . Stress: Not on file  Relationships  . Social connections:    Talks on phone: Not on file    Gets together: Not on file    Attends religious service: Not on file    Active member of club or organization: Not on file    Attends meetings of clubs or organizations: Not on file    Relationship status: Not on file  . Intimate partner violence:    Fear of current or ex partner: Not on file    Emotionally abused: Not on file    Physically abused: Not on file    Forced sexual activity: Not on file  Other Topics Concern  . Not on file  Social History Narrative  . Not on file    Family History  Problem Relation Age of Onset  . Heart attack Father   . Diabetes Father   . Breast cancer Mother   . Coronary artery disease Mother   . Heart disease Brother   . Heart attack Maternal Grandfather    Vitals:   10/12/17 1517  BP: (!) 144/75  Pulse: 69  SpO2: 100%  Weight: 101.2 kg (223 lb 3.2 oz)    Wt Readings from Last 3 Encounters:  10/12/17 101.2 kg (223 lb 3.2 oz)  10/11/17 102.2 kg (225 lb 6.4 oz)  09/02/17 100.7 kg (222 lb)   PHYSICAL EXAM: General: NAD Neck: No JVD,  no thyromegaly or thyroid nodule.  Lungs: Clear to auscultation bilaterally with normal respiratory effort. CV: Nondisplaced PMI.  Heart regular S1/S2, no S3/S4, no murmur.  No peripheral edema.  No carotid bruit.  Normal pedal pulses.  Abdomen: Soft, nontender,  no hepatosplenomegaly, no distention.  Skin: Intact without lesions or rashes.  Neurologic: Alert and oriented x 3.  Psych: Normal affect. Extremities: No clubbing or cyanosis.  HEENT: Normal.   ASSESSMENT & PLAN:  1. Chronic systolic CHF: Ischemic cardiomyopathy. Echo 06/09/17 EF 20-25%, echo 06/22/17 EF up to 30-35%. Most recent echo in 9/19 with EF 25-30%, severe LV dilation.  NYHA class II symptoms.  He is not volume overloaded on exam.  Despite mildly elevated BP, he has tolerated medications for cardiomyopathy poorly.  Lightheaded with Entresto and with higher dose of Coreg.  - Continue lisinopril 2.5 mg daily.  - Continue Coreg 6.25 mg bid (unable to titrate higher).   - I am going to have him start on spironolactone 12.5 mg daily. I do not think it will have an appreciable affect on his BP. BMET in 2 wks.  - No need for Lasix.    - He has a wide LBBB and persistently depressed EF post-CABG.  I recommended CRT-D placement.  He wants to think about it more.  He is wearing a Lifevest, he can continue to wear it as long as his insurance will cover it. He will call us if he changes his mind about CRT-D.  2. Syncope: 6/19. With low EF, ventricular arrhythmia is certainly a concern. Also, with LBBB, progressive conduction disease with bradyarrhythmia is a possibility. Certainly could have been a vagal event, but he had no trigger (was not in pain, just sitting in urology office prior to his appt). Telemetry with nonsustained WCT during 6/19 admission. Has baseline LBBB and did not appear to change axis. Unclear if SVT with aberrancy or VT.  - LifeVest in place.  I recommended CRT-D but he remains unsure about whether he wants it.   3.  CAD: S/p CABG 06/2017.  No exertional chest pain.  - Continue ASA.  - Cannot tolerate statins or Zetia. Continue Repatha. Lipids with followup labs.  4. Urethral stricture with suprapubic catheter: Sees urology later this week.   He will see Dr. Swaziland in 12/19.  I will see him back in 5 months.   Marca Ancona, MD 10/13/17

## 2017-10-14 MED ORDER — GENTAMICIN SULFATE 40 MG/ML IJ SOLN
440.0000 mg | INTRAVENOUS | Status: AC
  Administered 2017-10-15: 440 mg via INTRAVENOUS
  Filled 2017-10-14: qty 11

## 2017-10-14 NOTE — Anesthesia Preprocedure Evaluation (Addendum)
Anesthesia Evaluation  Patient identified by MRN, date of birth, ID band Patient awake    Reviewed: Allergy & Precautions, H&P , NPO status , Patient's Chart, lab work & pertinent test results  Airway Mallampati: II  TM Distance: >3 FB Neck ROM: Full    Dental no notable dental hx. (+) Dental Advisory Given, Teeth Intact   Pulmonary former smoker,    Pulmonary exam normal breath sounds clear to auscultation       Cardiovascular hypertension, + CAD, + Past MI and +CHF  Normal cardiovascular exam Rhythm:Regular Rate:Normal  Echo 09/14/2017  Left ventricle: The cavity size was severely dilated. Wall   thickness was normal. Systolic function was severely reduced. The   estimated ejection fraction was in the range of 25% to 30%.   Diffuse hypokinesis. Doppler parameters are consistent with   restrictive physiology, indicative of decreased left ventricular   diastolic compliance and/or increased left atrial pressure. - Aortic valve: There was mild regurgitation. - Aorta: Ascending aortic diameter: 40 mm (S). - Left atrium: The atrium was mildly dilated.  Impressions:  - Severe global reduction in LV systolic function; severe LVE;   restrictive filling; mildly dilated ascending aorta; mild AI;   mild LAE.   Neuro/Psych negative neurological ROS  negative psych ROS   GI/Hepatic negative GI ROS, Neg liver ROS,   Endo/Other    Renal/GU negative Renal ROS     Musculoskeletal  (+) Arthritis ,   Abdominal   Peds  Hematology negative hematology ROS (+)   Anesthesia Other Findings   Reproductive/Obstetrics                            Anesthesia Physical Anesthesia Plan  ASA: IV  Anesthesia Plan: General   Post-op Pain Management:    Induction: Intravenous  PONV Risk Score and Plan: 2 and Treatment may vary due to age or medical condition, Ondansetron and Dexamethasone  Airway Management  Planned: Oral ETT  Additional Equipment:   Intra-op Plan:   Post-operative Plan: Extubation in OR  Informed Consent: I have reviewed the patients History and Physical, chart, labs and discussed the procedure including the risks, benefits and alternatives for the proposed anesthesia with the patient or authorized representative who has indicated his/her understanding and acceptance.   Dental advisory given  Plan Discussed with:   Anesthesia Plan Comments: (LBB w 1st degree block patient  ICD discussions)      Anesthesia Quick Evaluation

## 2017-10-15 ENCOUNTER — Inpatient Hospital Stay (HOSPITAL_COMMUNITY): Payer: BLUE CROSS/BLUE SHIELD | Admitting: Anesthesiology

## 2017-10-15 ENCOUNTER — Inpatient Hospital Stay (HOSPITAL_COMMUNITY)
Admission: RE | Admit: 2017-10-15 | Discharge: 2017-10-16 | DRG: 672 | Disposition: A | Discharge status: Home / Self Care | Payer: BLUE CROSS/BLUE SHIELD | Source: Ambulatory Visit | Attending: Urology | Admitting: Urology

## 2017-10-15 ENCOUNTER — Encounter (HOSPITAL_COMMUNITY): Payer: Self-pay | Admitting: Emergency Medicine

## 2017-10-15 ENCOUNTER — Encounter (HOSPITAL_COMMUNITY)
Admission: RE | Disposition: A | Discharge status: Home / Self Care | Payer: Self-pay | Source: Ambulatory Visit | Attending: Urology

## 2017-10-15 ENCOUNTER — Other Ambulatory Visit: Payer: Self-pay

## 2017-10-15 DIAGNOSIS — I110 Hypertensive heart disease with heart failure: Secondary | ICD-10-CM | POA: Diagnosis not present

## 2017-10-15 DIAGNOSIS — Z951 Presence of aortocoronary bypass graft: Secondary | ICD-10-CM | POA: Diagnosis not present

## 2017-10-15 DIAGNOSIS — E7800 Pure hypercholesterolemia, unspecified: Secondary | ICD-10-CM | POA: Diagnosis not present

## 2017-10-15 DIAGNOSIS — I252 Old myocardial infarction: Secondary | ICD-10-CM | POA: Diagnosis not present

## 2017-10-15 DIAGNOSIS — I509 Heart failure, unspecified: Secondary | ICD-10-CM | POA: Diagnosis not present

## 2017-10-15 DIAGNOSIS — I2510 Atherosclerotic heart disease of native coronary artery without angina pectoris: Secondary | ICD-10-CM | POA: Diagnosis present

## 2017-10-15 DIAGNOSIS — Z23 Encounter for immunization: Secondary | ICD-10-CM | POA: Diagnosis not present

## 2017-10-15 DIAGNOSIS — Z955 Presence of coronary angioplasty implant and graft: Secondary | ICD-10-CM

## 2017-10-15 DIAGNOSIS — Z87891 Personal history of nicotine dependence: Secondary | ICD-10-CM | POA: Diagnosis not present

## 2017-10-15 DIAGNOSIS — Z7982 Long term (current) use of aspirin: Secondary | ICD-10-CM | POA: Diagnosis not present

## 2017-10-15 DIAGNOSIS — Z79899 Other long term (current) drug therapy: Secondary | ICD-10-CM | POA: Diagnosis not present

## 2017-10-15 DIAGNOSIS — N35011 Post-traumatic bulbous urethral stricture: Secondary | ICD-10-CM | POA: Diagnosis not present

## 2017-10-15 DIAGNOSIS — I10 Essential (primary) hypertension: Secondary | ICD-10-CM | POA: Diagnosis not present

## 2017-10-15 DIAGNOSIS — Z888 Allergy status to other drugs, medicaments and biological substances status: Secondary | ICD-10-CM

## 2017-10-15 DIAGNOSIS — N35912 Unspecified bulbous urethral stricture, male: Secondary | ICD-10-CM | POA: Diagnosis not present

## 2017-10-15 DIAGNOSIS — N35919 Unspecified urethral stricture, male, unspecified site: Secondary | ICD-10-CM | POA: Diagnosis not present

## 2017-10-15 HISTORY — PX: URETHROPLASTY: SHX499

## 2017-10-15 LAB — BASIC METABOLIC PANEL
Anion gap: 8 (ref 5–15)
BUN: 28 mg/dL — ABNORMAL HIGH (ref 8–23)
CO2: 27 mmol/L (ref 22–32)
Calcium: 9.6 mg/dL (ref 8.9–10.3)
Chloride: 107 mmol/L (ref 98–111)
Creatinine, Ser: 1.29 mg/dL — ABNORMAL HIGH (ref 0.61–1.24)
GFR calc Af Amer: >60 mL/min (ref >60)
GFR calc non Af Amer: 57 mL/min — ABNORMAL LOW (ref >60)
Glucose, Bld: 127 mg/dL — ABNORMAL HIGH (ref 70–99)
Potassium: 5.4 mmol/L — ABNORMAL HIGH (ref 3.5–5.1)
Sodium: 142 mmol/L (ref 135–145)

## 2017-10-15 LAB — CBC
HCT: 43.2 % (ref 39.0–52.0)
Hemoglobin: 13.3 g/dL (ref 13.0–17.0)
MCH: 27.1 pg (ref 26.0–34.0)
MCHC: 30.8 g/dL (ref 30.0–36.0)
MCV: 88.2 fL (ref 80.0–100.0)
Platelets: 264 10*3/uL (ref 150–400)
RBC: 4.9 MIL/uL (ref 4.22–5.81)
RDW: 18.5 % — ABNORMAL HIGH (ref 11.5–15.5)
WBC: 9.7 10*3/uL (ref 4.0–10.5)
nRBC: 0 % (ref 0.0–0.2)

## 2017-10-15 LAB — TYPE AND SCREEN
ABO/RH(D): AB POS
Antibody Screen: NEGATIVE

## 2017-10-15 SURGERY — URETHROPLASTY, USING PATCH GRAFT
Anesthesia: General | Site: Perineum

## 2017-10-15 MED ORDER — ACETAMINOPHEN 10 MG/ML IV SOLN
INTRAVENOUS | Status: AC
  Filled 2017-10-15: qty 100

## 2017-10-15 MED ORDER — ROCURONIUM BROMIDE 50 MG/5ML IV SOSY
PREFILLED_SYRINGE | INTRAVENOUS | Status: DC | PRN
  Administered 2017-10-15: 20 mg via INTRAVENOUS
  Administered 2017-10-15: 30 mg via INTRAVENOUS
  Administered 2017-10-15: 20 mg via INTRAVENOUS
  Administered 2017-10-15: 50 mg via INTRAVENOUS
  Administered 2017-10-15: 20 mg via INTRAVENOUS

## 2017-10-15 MED ORDER — ONDANSETRON HCL 4 MG/2ML IJ SOLN
INTRAMUSCULAR | Status: DC | PRN
  Administered 2017-10-15: 4 mg via INTRAVENOUS

## 2017-10-15 MED ORDER — MIDAZOLAM HCL 5 MG/5ML IJ SOLN
INTRAMUSCULAR | Status: DC | PRN
  Administered 2017-10-15 (×2): 1 mg via INTRAVENOUS

## 2017-10-15 MED ORDER — NITROFURANTOIN MACROCRYSTAL 100 MG PO CAPS: 100.0000 mg | ORAL_CAPSULE | Freq: Two times a day (BID) | ORAL | 0 refills | Status: DC

## 2017-10-15 MED ORDER — GABAPENTIN 300 MG PO CAPS
300.0000 mg | ORAL_CAPSULE | Freq: Every day | ORAL | Status: DC
  Administered 2017-10-15: 300 mg via ORAL
  Filled 2017-10-15: qty 1

## 2017-10-15 MED ORDER — HYDROCODONE-ACETAMINOPHEN 7.5-325 MG PO TABS: 1.0000 | ORAL_TABLET | Freq: Once | ORAL | Status: DC | PRN

## 2017-10-15 MED ORDER — PHENYLEPHRINE 40 MCG/ML (10ML) SYRINGE FOR IV PUSH (FOR BLOOD PRESSURE SUPPORT)
PREFILLED_SYRINGE | INTRAVENOUS | Status: DC | PRN
  Administered 2017-10-15: 80 ug via INTRAVENOUS
  Administered 2017-10-15 (×2): 40 ug via INTRAVENOUS

## 2017-10-15 MED ORDER — TRAMADOL HCL 50 MG PO TABS
50.0000 mg | ORAL_TABLET | Freq: Four times a day (QID) | ORAL | Status: DC | PRN
  Administered 2017-10-15: 100 mg via ORAL
  Administered 2017-10-16: 50 mg via ORAL
  Filled 2017-10-15: qty 1
  Filled 2017-10-15: qty 2

## 2017-10-15 MED ORDER — LIDOCAINE 2% (20 MG/ML) 5 ML SYRINGE
INTRAMUSCULAR | Status: DC | PRN
  Administered 2017-10-15: 100 mg via INTRAVENOUS

## 2017-10-15 MED ORDER — SUCCINYLCHOLINE CHLORIDE 200 MG/10ML IV SOSY
PREFILLED_SYRINGE | INTRAVENOUS | Status: AC
  Filled 2017-10-15: qty 10

## 2017-10-15 MED ORDER — MIRABEGRON ER 25 MG PO TB24
50.0000 mg | ORAL_TABLET | Freq: Every day | ORAL | Status: DC
  Administered 2017-10-15 – 2017-10-16 (×2): 50 mg via ORAL
  Filled 2017-10-15 (×2): qty 2

## 2017-10-15 MED ORDER — LIDOCAINE 2% (20 MG/ML) 5 ML SYRINGE
INTRAMUSCULAR | Status: AC
  Filled 2017-10-15: qty 5

## 2017-10-15 MED ORDER — HYDROMORPHONE HCL 1 MG/ML IJ SOLN
0.5000 mg | INTRAMUSCULAR | Status: DC | PRN
  Administered 2017-10-15 (×2): 0.5 mg via INTRAVENOUS
  Filled 2017-10-15 (×2): qty 0.5

## 2017-10-15 MED ORDER — FENTANYL CITRATE (PF) 100 MCG/2ML IJ SOLN
INTRAMUSCULAR | Status: DC | PRN
  Administered 2017-10-15 (×5): 50 ug via INTRAVENOUS

## 2017-10-15 MED ORDER — MEPERIDINE HCL 50 MG/ML IJ SOLN: 6.2500 mg | INTRAMUSCULAR | Status: DC | PRN

## 2017-10-15 MED ORDER — LIDOCAINE-EPINEPHRINE 1 %-1:100000 IJ SOLN
INTRAMUSCULAR | Status: DC | PRN
  Administered 2017-10-15: 3 mL

## 2017-10-15 MED ORDER — DEXAMETHASONE SODIUM PHOSPHATE 10 MG/ML IJ SOLN
INTRAMUSCULAR | Status: AC
  Filled 2017-10-15: qty 1

## 2017-10-15 MED ORDER — CEFAZOLIN SODIUM-DEXTROSE 2-4 GM/100ML-% IV SOLN
2.0000 g | INTRAVENOUS | Status: AC
  Administered 2017-10-15: 2 g via INTRAVENOUS
  Filled 2017-10-15: qty 100

## 2017-10-15 MED ORDER — CHLORHEXIDINE GLUCONATE 0.12 % MT SOLN: 15.0000 mL | Freq: Two times a day (BID) | OROMUCOSAL | 0 refills | Status: AC

## 2017-10-15 MED ORDER — SODIUM CHLORIDE 0.9 % IV SOLN
INTRAVENOUS | Status: AC
  Filled 2017-10-15 (×2): qty 500000

## 2017-10-15 MED ORDER — FENTANYL CITRATE (PF) 250 MCG/5ML IJ SOLN
INTRAMUSCULAR | Status: AC
  Filled 2017-10-15: qty 5

## 2017-10-15 MED ORDER — INFLUENZA VAC SPLIT QUAD 0.5 ML IM SUSY
0.5000 mL | PREFILLED_SYRINGE | INTRAMUSCULAR | Status: AC
  Administered 2017-10-16: 0.5 mL via INTRAMUSCULAR
  Filled 2017-10-15: qty 0.5

## 2017-10-15 MED ORDER — BELLADONNA ALKALOIDS-OPIUM 16.2-60 MG RE SUPP: 1.0000 | Freq: Three times a day (TID) | RECTAL | Status: DC | PRN

## 2017-10-15 MED ORDER — ACETAMINOPHEN 500 MG PO TABS
1000.0000 mg | ORAL_TABLET | Freq: Once | ORAL | Status: AC
  Administered 2017-10-15: 1000 mg via ORAL
  Filled 2017-10-15: qty 2

## 2017-10-15 MED ORDER — CIPROFLOXACIN HCL 500 MG PO TABS: 500.0000 mg | ORAL_TABLET | Freq: Two times a day (BID) | ORAL | 0 refills | Status: DC

## 2017-10-15 MED ORDER — FENTANYL CITRATE (PF) 100 MCG/2ML IJ SOLN
INTRAMUSCULAR | Status: AC
  Administered 2017-10-15: 25 ug via INTRAVENOUS
  Filled 2017-10-15: qty 2

## 2017-10-15 MED ORDER — ROCURONIUM BROMIDE 100 MG/10ML IV SOLN
INTRAVENOUS | Status: AC
  Filled 2017-10-15: qty 1

## 2017-10-15 MED ORDER — CIPROFLOXACIN HCL 500 MG PO TABS
500.0000 mg | ORAL_TABLET | Freq: Once | ORAL | Status: AC
  Administered 2017-10-15: 500 mg via ORAL
  Filled 2017-10-15: qty 1

## 2017-10-15 MED ORDER — SODIUM CHLORIDE 0.9 % IV SOLN
INTRAVENOUS | Status: DC | PRN
  Administered 2017-10-15: 25 ug/min via INTRAVENOUS

## 2017-10-15 MED ORDER — SODIUM CHLORIDE 0.9 % IV SOLN
INTRAVENOUS | Status: DC | PRN
  Administered 2017-10-15: 500 mL

## 2017-10-15 MED ORDER — PROPOFOL 10 MG/ML IV BOLUS
INTRAVENOUS | Status: AC
  Filled 2017-10-15: qty 20

## 2017-10-15 MED ORDER — DEXAMETHASONE SODIUM PHOSPHATE 10 MG/ML IJ SOLN
INTRAMUSCULAR | Status: DC | PRN
  Administered 2017-10-15: 10 mg via INTRAVENOUS

## 2017-10-15 MED ORDER — TRAMADOL HCL 50 MG PO TABS: 50.0000 mg | ORAL_TABLET | Freq: Four times a day (QID) | ORAL | 0 refills | Status: DC | PRN

## 2017-10-15 MED ORDER — ZOLPIDEM TARTRATE 10 MG PO TABS: 10.0000 mg | ORAL_TABLET | Freq: Every day | ORAL | Status: DC

## 2017-10-15 MED ORDER — LORATADINE 10 MG PO TABS: 10.0000 mg | ORAL_TABLET | Freq: Every day | ORAL | Status: DC | PRN

## 2017-10-15 MED ORDER — ONDANSETRON HCL 4 MG PO TABS: 4.0000 mg | ORAL_TABLET | Freq: Three times a day (TID) | ORAL | Status: DC | PRN

## 2017-10-15 MED ORDER — 0.9 % SODIUM CHLORIDE (POUR BTL) OPTIME
TOPICAL | Status: DC | PRN
  Administered 2017-10-15: 1000 mL

## 2017-10-15 MED ORDER — KETOROLAC TROMETHAMINE 15 MG/ML IJ SOLN
15.0000 mg | Freq: Four times a day (QID) | INTRAMUSCULAR | Status: AC
  Administered 2017-10-15 (×2): 15 mg via INTRAVENOUS
  Filled 2017-10-15 (×2): qty 1

## 2017-10-15 MED ORDER — FENTANYL CITRATE (PF) 100 MCG/2ML IJ SOLN
25.0000 ug | INTRAMUSCULAR | Status: DC | PRN
  Administered 2017-10-15: 25 ug via INTRAVENOUS
  Administered 2017-10-15: 50 ug via INTRAVENOUS
  Administered 2017-10-15: 25 ug via INTRAVENOUS

## 2017-10-15 MED ORDER — ACETAMINOPHEN 10 MG/ML IV SOLN
1000.0000 mg | Freq: Four times a day (QID) | INTRAVENOUS | Status: AC
  Administered 2017-10-15 – 2017-10-16 (×4): 1000 mg via INTRAVENOUS
  Filled 2017-10-15 (×3): qty 100

## 2017-10-15 MED ORDER — SUGAMMADEX SODIUM 200 MG/2ML IV SOLN
INTRAVENOUS | Status: DC | PRN
  Administered 2017-10-15: 200 mg via INTRAVENOUS

## 2017-10-15 MED ORDER — STERILE WATER FOR IRRIGATION IR SOLN
Status: DC | PRN
  Administered 2017-10-15: 1000 mL via INTRAVESICAL
  Administered 2017-10-15: 3000 mL via INTRAVESICAL

## 2017-10-15 MED ORDER — SPIRONOLACTONE 12.5 MG HALF TABLET
12.5000 mg | ORAL_TABLET | Freq: Every day | ORAL | Status: DC
  Administered 2017-10-15 – 2017-10-16 (×2): 12.5 mg via ORAL
  Filled 2017-10-15 (×2): qty 1

## 2017-10-15 MED ORDER — DEXTROSE-NACL 5-0.45 % IV SOLN
INTRAVENOUS | Status: DC
  Administered 2017-10-15 – 2017-10-16 (×2): via INTRAVENOUS

## 2017-10-15 MED ORDER — KCL IN DEXTROSE-NACL 20-5-0.45 MEQ/L-%-% IV SOLN
INTRAVENOUS | Status: DC
  Administered 2017-10-15: 15:00:00 via INTRAVENOUS
  Filled 2017-10-15 (×2): qty 1000

## 2017-10-15 MED ORDER — CARVEDILOL 6.25 MG PO TABS
6.2500 mg | ORAL_TABLET | Freq: Two times a day (BID) | ORAL | Status: DC
  Administered 2017-10-15 – 2017-10-16 (×2): 6.25 mg via ORAL
  Filled 2017-10-15 (×2): qty 1

## 2017-10-15 MED ORDER — SUGAMMADEX SODIUM 200 MG/2ML IV SOLN
INTRAVENOUS | Status: AC
  Filled 2017-10-15: qty 2

## 2017-10-15 MED ORDER — HYDRALAZINE HCL 20 MG/ML IJ SOLN: 5.0000 mg | INTRAMUSCULAR | Status: DC | PRN

## 2017-10-15 MED ORDER — DOCUSATE SODIUM 100 MG PO CAPS
100.0000 mg | ORAL_CAPSULE | Freq: Two times a day (BID) | ORAL | Status: DC
  Administered 2017-10-15 – 2017-10-16 (×2): 100 mg via ORAL
  Filled 2017-10-15 (×2): qty 1

## 2017-10-15 MED ORDER — ASPIRIN 81 MG PO CHEW
81.0000 mg | CHEWABLE_TABLET | Freq: Every day | ORAL | Status: DC
  Administered 2017-10-15 – 2017-10-16 (×2): 81 mg via ORAL
  Filled 2017-10-15 (×2): qty 1

## 2017-10-15 MED ORDER — LACTATED RINGERS IV SOLN
INTRAVENOUS | Status: DC
  Administered 2017-10-15: 07:00:00 via INTRAVENOUS

## 2017-10-15 MED ORDER — CHLORHEXIDINE GLUCONATE 0.12 % MT SOLN
15.0000 mL | Freq: Four times a day (QID) | OROMUCOSAL | Status: DC
  Administered 2017-10-15 – 2017-10-16 (×3): 15 mL via OROMUCOSAL
  Filled 2017-10-15 (×3): qty 15

## 2017-10-15 MED ORDER — LISINOPRIL 2.5 MG PO TABS
2.5000 mg | ORAL_TABLET | Freq: Every day | ORAL | Status: DC
  Administered 2017-10-15 – 2017-10-16 (×2): 2.5 mg via ORAL
  Filled 2017-10-15 (×2): qty 1

## 2017-10-15 MED ORDER — NITROFURANTOIN MACROCRYSTAL 100 MG PO CAPS: 100.0000 mg | ORAL_CAPSULE | Freq: Two times a day (BID) | ORAL | 0 refills | Status: AC

## 2017-10-15 MED ORDER — ZOLPIDEM TARTRATE 5 MG PO TABS
5.0000 mg | ORAL_TABLET | Freq: Every evening | ORAL | Status: DC | PRN
  Administered 2017-10-15: 5 mg via ORAL
  Filled 2017-10-15: qty 1

## 2017-10-15 MED ORDER — KETOROLAC TROMETHAMINE 30 MG/ML IJ SOLN: 30.0000 mg | Freq: Once | INTRAMUSCULAR | Status: DC | PRN

## 2017-10-15 MED ORDER — ONDANSETRON HCL 4 MG/2ML IJ SOLN: 4.0000 mg | Freq: Once | INTRAMUSCULAR | Status: DC | PRN

## 2017-10-15 MED ORDER — BUPIVACAINE HCL (PF) 0.5 % IJ SOLN
INTRAMUSCULAR | Status: AC
  Filled 2017-10-15: qty 30

## 2017-10-15 MED ORDER — LIDOCAINE-EPINEPHRINE 1 %-1:100000 IJ SOLN
INTRAMUSCULAR | Status: AC
  Filled 2017-10-15: qty 1

## 2017-10-15 MED ORDER — MIDAZOLAM HCL 2 MG/2ML IJ SOLN
INTRAMUSCULAR | Status: AC
  Filled 2017-10-15: qty 2

## 2017-10-15 MED ORDER — KETOROLAC TROMETHAMINE 15 MG/ML IJ SOLN
INTRAMUSCULAR | Status: AC
  Administered 2017-10-15: 13:00:00
  Filled 2017-10-15: qty 1

## 2017-10-15 MED ORDER — PROPOFOL 10 MG/ML IV BOLUS
INTRAVENOUS | Status: DC | PRN
  Administered 2017-10-15: 130 mg via INTRAVENOUS

## 2017-10-15 MED ORDER — CIPROFLOXACIN HCL 500 MG PO TABS
500.0000 mg | ORAL_TABLET | Freq: Two times a day (BID) | ORAL | Status: DC
  Administered 2017-10-15 – 2017-10-16 (×2): 500 mg via ORAL
  Filled 2017-10-15 (×2): qty 1

## 2017-10-15 MED ORDER — ONDANSETRON HCL 4 MG/2ML IJ SOLN
INTRAMUSCULAR | Status: AC
  Filled 2017-10-15: qty 2

## 2017-10-15 SURGICAL SUPPLY — 64 items
ADH SKN CLS APL DERMABOND .7 (GAUZE/BANDAGES/DRESSINGS) ×1
BAG URINE DRAINAGE (UROLOGICAL SUPPLIES) ×2 IMPLANT
BLADE HEX COATED 2.75 (ELECTRODE) ×2 IMPLANT
BLADE SURG 15 STRL LF DISP TIS (BLADE) ×2 IMPLANT
BLADE SURG 15 STRL SS (BLADE) ×4
BLADE SURG SZ10 CARB STEEL (BLADE) ×2 IMPLANT
BNDG GAUZE ELAST 4 BULKY (GAUZE/BANDAGES/DRESSINGS) ×2 IMPLANT
BRIEF STRETCH FOR OB PAD LRG (UNDERPADS AND DIAPERS) ×2 IMPLANT
CATH FOLEY 2WAY SLVR  5CC 14FR (CATHETERS) ×1
CATH FOLEY 2WAY SLVR  5CC 18FR (CATHETERS) ×1
CATH FOLEY 2WAY SLVR 5CC 14FR (CATHETERS) ×1 IMPLANT
CATH FOLEY 2WAY SLVR 5CC 18FR (CATHETERS) ×1 IMPLANT
CATH INTERMIT  6FR 70CM (CATHETERS) ×2 IMPLANT
CATH ROBINSON RED A/P 16FR (CATHETERS) ×1 IMPLANT
CATH ROBINSON RED A/P 18FR (CATHETERS) IMPLANT
CATH ROBINSON RED A/P 20FR (CATHETERS) IMPLANT
COVER SURGICAL LIGHT HANDLE (MISCELLANEOUS) ×2 IMPLANT
COVER WAND RF STERILE (DRAPES) IMPLANT
DERMABOND ADVANCED (GAUZE/BANDAGES/DRESSINGS) ×1
DERMABOND ADVANCED .7 DNX12 (GAUZE/BANDAGES/DRESSINGS) ×1 IMPLANT
DRAIN PENROSE 18X1/4 LTX STRL (WOUND CARE) ×1 IMPLANT
DRAPE INCISE IOBAN 66X45 STRL (DRAPES) ×2 IMPLANT
DRAPE SHEET LG 3/4 BI-LAMINATE (DRAPES) ×2 IMPLANT
DRSG TELFA 3X8 NADH (GAUZE/BANDAGES/DRESSINGS) ×2 IMPLANT
ELECT PENCIL ROCKER SW 15FT (MISCELLANEOUS) ×3 IMPLANT
GAUZE 4X4 16PLY RFD (DISPOSABLE) ×5 IMPLANT
GLOVE BIOGEL M STRL SZ7.5 (GLOVE) ×2 IMPLANT
GOWN STRL REUS W/TWL XL LVL3 (GOWN DISPOSABLE) ×2 IMPLANT
GUIDEWIRE STR DUAL SENSOR (WIRE) ×2 IMPLANT
HOLDER FOLEY CATH W/STRAP (MISCELLANEOUS) ×2 IMPLANT
KIT BASIN OR (CUSTOM PROCEDURE TRAY) ×2 IMPLANT
NDL HYPO 25X1 1.5 SAFETY (NEEDLE) ×8 IMPLANT
NEEDLE HYPO 25X1 1.5 SAFETY (NEEDLE) ×18 IMPLANT
PACK CYSTO (CUSTOM PROCEDURE TRAY) ×2 IMPLANT
PAD DRESSING TELFA 3X8 NADH (GAUZE/BANDAGES/DRESSINGS) ×2 IMPLANT
PAK SCROTO (SET/KITS/TRAYS/PACK) ×1 IMPLANT
PLUG CATH AND CAP STER (CATHETERS) ×2 IMPLANT
SHEET LAVH (DRAPES) ×2 IMPLANT
SLEEVE SURGEON STRL (DRAPES) ×1 IMPLANT
SPONGE LAP 4X18 RFD (DISPOSABLE) ×2 IMPLANT
STAPLER VISISTAT 35W (STAPLE) ×2 IMPLANT
STRIP CLOSURE SKIN 1/2X4 (GAUZE/BANDAGES/DRESSINGS) ×4 IMPLANT
SUT CHROMIC 2 0 SH (SUTURE) ×4 IMPLANT
SUT CHROMIC 3 0 PS 2 (SUTURE) ×3 IMPLANT
SUT CHROMIC 3 0 SH 27 (SUTURE) ×10 IMPLANT
SUT CHROMIC 4 0 PS 2 18 (SUTURE) IMPLANT
SUT CHROMIC 4 0 SH 27 (SUTURE) ×4 IMPLANT
SUT MNCRL AB 4-0 PS2 18 (SUTURE) ×3 IMPLANT
SUT SILK 2 0 30  PSL (SUTURE) ×1
SUT SILK 2 0 30 PSL (SUTURE) ×1 IMPLANT
SUT SILK 3 0 SH CR/8 (SUTURE) ×2 IMPLANT
SUT VIC AB 2-0 SH 27 (SUTURE)
SUT VIC AB 2-0 SH 27X BRD (SUTURE) ×6 IMPLANT
SUT VIC AB 3-0 SH 27 (SUTURE) ×14
SUT VIC AB 3-0 SH 27XBRD (SUTURE) ×1 IMPLANT
SUT VIC AB 4-0 SH 27 (SUTURE) ×10
SUT VIC AB 4-0 SH 27XBRD (SUTURE) IMPLANT
SYR 10ML LL (SYRINGE) ×2 IMPLANT
SYR CONTROL 10ML LL (SYRINGE) ×1 IMPLANT
TOWEL OR 17X26 10 PK STRL BLUE (TOWEL DISPOSABLE) ×3 IMPLANT
TOWEL OR NON WOVEN STRL DISP B (DISPOSABLE) ×2 IMPLANT
WATER STERILE IRR 1000ML POUR (IV SOLUTION) ×2 IMPLANT
YANKAUER SUCT BULB TIP 10FT TU (MISCELLANEOUS) ×3 IMPLANT
YANKAUER SUCT BULB TIP NO VENT (SUCTIONS) ×1 IMPLANT

## 2017-10-15 NOTE — Discharge Instructions (Signed)
Foley Catheter Care A soft, flexible tube (Foley catheter) may have been placed in your bladder to drain urine and fluid. Follow these instructions: Taking Care of the Catheter Keep the area where the catheter leaves your body clean.  Attach the catheter to the leg so there is no tension on the catheter.  Keep the drainage bag below the level of the bladder, but keep it OFF the floor.  Do not take long soaking baths. Your caregiver will give instructions about showering.  Wash your hands before touching ANYTHING related to the catheter or bag.  Using mild soap and warm water on a washcloth:  Clean the area closest to the catheter insertion site using a circular motion around the catheter.  Clean the catheter itself by wiping AWAY from the insertion site for several inches down the tube.  NEVER wipe upward as this could sweep bacteria up into the urethra (tube in your body that normally drains the bladder) and cause infection.  Place a small amount of sterile lubricant at the tip of the penis where the catheter is entering.  Taking Care of the Drainage Bags Two drainage bags may be taken home: a large overnight drainage bag, and a smaller leg bag which fits underneath clothing.  It is okay to wear the overnight bag at any time, but NEVER wear the smaller leg bag at night.  Keep the drainage bag well below the level of your bladder. This prevents backflow of urine into the bladder and allows the urine to drain freely.  Anchor the tubing to your leg to prevent pulling or tension on the catheter. Use tape or a leg strap provided by the hospital.  Empty the drainage bag when it is 1/2 to 3/4 full. Wash your hands before and after touching the bag.  Periodically check the tubing for kinks to make sure there is no pressure on the tubing which could restrict the flow of urine.  Changing the Drainage Bags Cleanse both ends of the clean bag with alcohol before changing.  Pinch off the rubber catheter to  avoid urine spillage during the disconnection.  Disconnect the dirty bag and connect the clean one.  Empty the dirty bag carefully to avoid a urine spill.  Attach the new bag to the leg with tape or a leg strap.  Cleaning the Drainage Bags Whenever a drainage bag is disconnected, it must be cleaned quickly so it is ready for the next use.  Wash the bag in warm, soapy water.  Rinse the bag thoroughly with warm water.  Soak the bag for 30 minutes in a solution of white vinegar and water (1 cup vinegar to 1 quart warm water).  Rinse with warm water.  SEEK MEDICAL CARE IF:  You have chills or night sweats.  You are leaking around your catheter or have problems with your catheter. It is not uncommon to have sporadic leakage around your catheter as a result of bladder spasms. If the leakage stops, there is not much need for concern. If you are uncertain, call your caregiver.  You develop side effects that you think are coming from your medicines.  SEEK IMMEDIATE MEDICAL CARE IF:  You are suddenly unable to urinate. Check to see if there are any kinks in the drainage tubing that may cause this. If you cannot find any kinks, call your caregiver immediately. This is an emergency.  You develop shortness of breath or chest pains.  Bleeding persists or clots develop in your urine.    You have a fever.  You develop pain in your back or over your lower belly (abdomen).  You develop pain or swelling in your legs.  Any problems you are having get worse rather than better.  MAKE SURE YOU:  Understand these instructions.  Will watch your condition.  Will get help right away if you are not doing well or get worse.   Activity:  You are encouraged to ambulate frequently (about every hour during waking hours) to help prevent blood clots from forming in your legs or lungs.  However, you should not engage in any heavy lifting (> 10-15 lbs), strenuous activity, or straining.  Diet: You should advance your diet as  instructed by your physician.  It will be normal to have some bloating, nausea, and abdominal discomfort intermittently.  Prescriptions:  You will be provided a prescription for pain medication to take as needed.  If your pain is not severe enough to require the prescription pain medication, you may take extra strength Tylenol instead which will have less side effects.  You should also take a prescribed stool softener to avoid straining with bowel movements as the prescription pain medication may constipate you.  Incisions: You may remove your dressing bandages 48 hours after surgery if not removed in the hospital.  You will either have some small staples or special tissue glue at each of the incision sites. Once the bandages are removed (if present), the incisions may stay open to air.  You may start showering (but not soaking or bathing in water) the 2nd day after surgery and the incisions simply need to be patted dry after the shower.  No additional care is needed.  What to call us about: You should call the office ([phone]) if you develop fever > 101 or develop persistent vomiting. Activity:  You are encouraged to ambulate frequently (about every hour during waking hours) to help prevent blood clots from forming in your legs or lungs.  However, you should not engage in any heavy lifting (> 10-15 lbs), strenuous activity, or straining.     

## 2017-10-15 NOTE — Progress Notes (Addendum)
Pt states he has implanted wires for defibrillator in the future to be placed.  Pt states he does not have pacemaker or defibrillator in place at this time. Dr. Richardson Landry notified.

## 2017-10-15 NOTE — Plan of Care (Signed)

## 2017-10-15 NOTE — Anesthesia Procedure Notes (Signed)
Procedure Name: Intubation Date/Time: 10/15/2017 9:37 AM Performed by: Maxwell Caul, CRNA Pre-anesthesia Checklist: Patient identified, Emergency Drugs available, Suction available and Patient being monitored Patient Re-evaluated:Patient Re-evaluated prior to induction Oxygen Delivery Method: Circle system utilized Preoxygenation: Pre-oxygenation with 100% oxygen Induction Type: IV induction Ventilation: Mask ventilation without difficulty Laryngoscope Size: Mac and 4 Grade View: Grade II Tube type: Oral Tube size: 7.5 mm Number of attempts: 1 Airway Equipment and Method: Stylet and Oral airway Placement Confirmation: ETT inserted through vocal cords under direct vision,  positive ETCO2 and breath sounds checked- equal and bilateral Secured at: 21 cm Tube secured with: Tape Dental Injury: Teeth and Oropharynx as per pre-operative assessment

## 2017-10-15 NOTE — Op Note (Signed)
Preoperative diagnosis:  1. urethral stricture   Postoperative diagnosis:  1. Same   Procedure: 1. Ventral onlay Urethroplasty with autologous buccal mucosa graft 2. Buccal graft mucosal harvest 3. Cystourethroscopy  Surgeon: Crist Fat, MD 1st Assistant: Alfredo Martinez, M.D. Resident Assistant: Kevan Rosebush, MD  Anesthesia: General  Complications: None  Intraoperative findings: The patient had a long stricture measuring approximately 3.5cm. The stricture was repaired using approximately 4 cm of buccal mucosa.  EBL: 100  Specimens: None  Indication:  [name] is a 62 y.o.  patient with a recurrent dense urethral stricture.  After reviewing the management options for treatment, he elected to proceed with the above surgical procedure(s). We have discussed the potential benefits and risks of the procedure, side effects of the proposed treatment, the likelihood of the patient achieving the goals of the procedure, and any potential problems that might occur during the procedure or recuperation. Informed consent has been obtained.  Description of procedure: An assistant was required for this surgical procedure.  The duties of the assistant included but were not limited to suctioning, tissue retraction, and suturing. This procedure would not be able to be performed without an Geophysicist/field seismologist.  The patient was taken to the operating room and general anesthesia was induced.  The patient was placed in the dorsal lithotomy position, prepped and draped in the usual sterile fashion, and preoperative antibiotics were administered. A preoperative time-out was performed.   Cystourethroscopy was performed using a 17 French rigid cystoscope noting a normal urethra to the bulbar region where there was a dense urethral stricture unable to be passed by the 17 [place] scope. We then passed a 6 [place] open-ended ureteral catheter through the scope and the stricture and then on into the  bladder lumen.  I then placed the flexible ureteroscope through the patient's suprapubic tract and down the bladder neck noting that the stricture was just distal to the external sphincter and membranous urethra.   I then passed a 20 French red rubber catheter into the patient's urethra and advanced it to an area where it no longer was easily advanced and measured this as our distal margin. We then made a midline incision between the scrotum and the anus, performed an ellipse around the anus to extend the incision, and took this down to the bulbar urethral muscle. A [place] retractor was then placed in the incision edges held open with hooks.  We then split the bulbourethral muscles midline and dissected down onto the urethra. We dissected the urethra clear anteriorly both proximally and distally to the area where the catheter no longer advanced. We then cleaned off the corpora bilaterally.  The we then mobilized the urethra both proximally and distally to incorporate the entire length stricture. Once the urethra was mobile we made an incision at the tip of the catheter on the ventral aspect of the urethra. We then opened up the urethra ventrally through the stricture.  Holding stitches were placed on each side of the opening for a total of 8 holding stitches.  We then passed a 20 French catheter through the proximal end of the opening to ensure that we could get this by. Performed cystoscopy again noting that the urethra just proximal to the membranous urethra was unhealthy appearing, but no clear stricture. The strictured segment that was open measured approximate 4 cm.  We then turned our attention to the mouth and performed a buccal mucosa harvest. This was performed by first placing a dental pack  the pin to the patient's throat tucking the tongue away as well. I then put 2 stay sutures on the mucosa just adjacent to the lips. These were used to retract the cheeks. I then used a ruler and measured 5 cm just  inferior to Stensen's duct. We then measured approximately 15 mm wide. I then injected 1% lidocaine with epinephrine into the measured area and with a 15 blade incised the graft. I then undermined the graft with Metzenbaum scissors ensuring that I was in the fatty layer above the masseter muscle. Once the graft was free I then reapproximated the edges using a 4-0 chromic suture in a running fashion. Hemostasis was noted to be excellent. We quickly Betadine the area and then packed the cheek so as to provide excellent hemostasis. Then we took the graft to the back table and defatted the graft extensively. We then placed a normal saline solution.   Next we returned back to the perineum and insured that the urethra was adequately dissected out and the stricture opened.  An 23 French Foley catheter was then placed into the patient's urethra and into the bladder.  10 cc of sterile water was used to inflate the balloon.  The graft was then top and bottom openings of the urethra where the stricture was incised with 4-0 Vicryl stay sutures.  The graft was then closed over the Foley catheter to the mucosal edges of the urethral rue strip.  We then ran the 2 4-0 Vicryl in a running fashion along the lateral aspects of the graft securing the graft to the urethral rue strip.  We then performed 4 small fenestration incisions within the graft creating outlet for any bleeding from the sponge tissue.  Once the graft had been completely sewn into the urethra we placed 3 sutures to close the urethral sponge tissue ensuring that the graft had also been included in the stitch in interrupted fashion the top, medial aspect, and posterior parts of the incision.  A 3-0 Vicryl suture was then used to run the length of the urethral sponge tissue completely tightly closing the incision.   I then closed the bulbar urethral muscle and overlying tissue with a 4-0 Vicryl in a running fashion. I then closed the Colles' fascia with a 3-0  Vicryl in a running fashion. I then closed the subcutaneous tissue in a interrupted fashion using 3-0 Vicryl. The skin was then reapproximated with a 4-0 subcuticular Monocryl stitch. Dermabond was then applied. Prior to skin closure corpus and Marcaine was injected into the incision.  The suprapubic tube incision was then covered with 2x 2 gauze and secured with a Tegaderm.  The packing was then subsequently removed from the patient's mouth and hemostasis was noted to be excellent. He was subsequently extubated and returned to PACU in stable condition.   Crist Fat, M.D.

## 2017-10-15 NOTE — Transfer of Care (Signed)
Immediate Anesthesia Transfer of Care Note  Patient: [name]  Procedure(s) Performed: URETHROPLASTY WITH BUCCAL GRAFT HARVAST (N/A Perineum)  Patient Location: PACU  Anesthesia Type:General  Level of Consciousness: awake, alert  and oriented  Airway & Oxygen Therapy: Patient Spontanous Breathing and Patient connected to face mask oxygen  Post-op Assessment: Report given to RN and Post -op Vital signs reviewed and stable  Post vital signs: Reviewed and stable  Last Vitals:  Vitals Value Taken Time  BP 138/88 10/15/2017 12:38 PM  Temp    Pulse 83 10/15/2017 12:44 PM  Resp 14 10/15/2017 12:44 PM  SpO2 99 % 10/15/2017 12:44 PM  Vitals shown include unvalidated device data.  Last Pain:  Vitals:   10/15/17 0723  TempSrc:   PainSc: 0-No pain      Patients Stated Pain Goal: 4 (10/15/17 0723)  Complications: No apparent anesthesia complications

## 2017-10-15 NOTE — H&P (Signed)
[name] is a 62 year-old male established patient who is here for a urethral stricture.  He does have a history of urethral strictures. He does have to strain or bear down to start his urinary stream. He does not have a split stream when he urinates. He does not have a good size and strength to his urinary stream. His urinary stream does not start and stop during voiding. He is not having problems with emptying his bladder well.   He does not have a previous history of sexually transmitted diseases. He has had a history of trauma to the urethra . He has not received radiation therapy. His symptoms have gotten worse over the last year.   He has previously had an indwelling catheter in for more than two weeks at a time.   07/26/2017: Patient required SP tube placement at the time of his CABG for a dense bulbar urethral stricture. He underwent VCUG and RUG which showed a 2cm narrow bulbar urethral stricture. He currently has an indwelling SP tube which was last changed 07/27/17.   The patient is referred to me today for further discussion of his bulbar urethral stricture. This was noted on his recent retrograde urethrogram. The patient is currently being managed with a suprapubic tube. The patient is very anxious to proceed with definitive urethroplasty to that he can get rid of his suprapubic tube.   The patient is status post coronary artery bypass grafts. Postoperatively he had bradycardia. He is currently wearing an external defibrillator. Prior to surgery as ejection fraction was 25%. His most recent echo demonstrated 40-45%. He is currently not anticoagulated.   Interval: The patient presents today with his wife to discuss urethroplasty. He is now back to work and feeling well. His SP tube was changed 2 weeks ago. He is having some spasm intermittently and some irritation from the insertion site, otherwise doing well. He is not voiding per urethra.   The patient has not followed up with  cardiology recently, he has a pending cardiology appointment to discuss a recent echo.     ALLERGIES: ezetimibe - Other Reaction, joint pain Niacin - Other Reaction, hypotension Statin Drugs - Other Reaction, hypotension    MEDICATIONS: Oxybutynin Chloride 5 mg tablet 1 tablet PO TID PRN  AmLODIPine Besylate 5 MG Oral Tablet Oral  Aspirin 81 MG TABS Oral  Cholesterol Injection  Multi-Vitamin Oral Tablet 0 Oral     GU PSH: Cysto Dilate Stricture (M or F) - 2014 Drain Bl W/Cath Insertion - 06/14/2017 Simple Change SP Tube - 09/20/2017, 07/26/2017      PSH Notes: Multiple cardiac caths   NON-GU PSH: Back surgery CABG (coronary artery bypass grafting) - about 06/12/2017 Cardiac Stent Placement Heart Surgery (Unspecified)    GU PMH: Urinary Urgency - 09/20/2017 Bulbar urethral stricture - 07/27/2017, - 07/26/2017 Renal calculus, Nephrolithiasis - 2015 Urethral Stricture, Unspec, Urethral stricture - 2015 Acute Cystitis/UTI, Acute cystitis without hematuria - 2014 Encounter for Prostate Cancer screening, Prostate cancer screening - 2014      PMH Notes: .   NON-GU PMH: Encounter for general adult medical examination without abnormal findings, Encounter for preventive health examination - 2015 Hypercholesterolemia, High cholesterol - 2015 Personal history of other diseases of the circulatory system, History of heart failure - 2015, History of cardiac disorder, - 2014, History of hypertension, - 2014 Personal history of other diseases of the musculoskeletal system and connective tissue, History of arthritis - 2015 Arrhythmia Congestive heart failure Coronary Artery Disease Gout  Hypertension Non-ST elevation (NSTEMI) myocardial infarction    FAMILY HISTORY: Breast Cancer - Mother Death In The Family Father - Runs In Family Death In The Family Mother - Runs In Family Diabetes - Father Family Health Status Number - Runs In Family Heart Disease - Father, Brother, Mother   SOCIAL  HISTORY: Marital Status: Married Preferred Language: English; Ethnicity: Not Hispanic Or Latino; Race: White Current Smoking Status: Patient has never smoked.   Tobacco Use Assessment Completed: Used Tobacco in last 30 days? Does not use smokeless tobacco. Does drink.  Does not use drugs. Does not drink caffeine. Has had a blood transfusion.    REVIEW OF SYSTEMS:    GU Review Male:   Patient reports frequent urination and hard to postpone urination. Patient denies burning/ pain with urination, get up at night to urinate, leakage of urine, stream starts and stops, trouble starting your stream, have to strain to urinate , erection problems, and penile pain.  Gastrointestinal (Upper):   Patient denies nausea, vomiting, and indigestion/ heartburn.  Gastrointestinal (Lower):   Patient denies diarrhea and constipation.  Constitutional:   Patient denies fever, night sweats, weight loss, and fatigue.  Skin:   Patient reports skin rash/ lesion and itching.   Eyes:   Patient denies blurred vision and double vision.  Ears/ Nose/ Throat:   Patient denies sore throat and sinus problems.  Hematologic/Lymphatic:   Patient denies swollen glands and easy bruising.  Cardiovascular:   Patient denies chest pains and leg swelling.  Respiratory:   Patient reports shortness of breath. Patient denies cough.  Endocrine:   Patient reports excessive thirst.   Musculoskeletal:   Patient reports back pain and joint pain.   Neurological:   Patient denies headaches and dizziness.  Psychologic:   Patient denies depression and anxiety.   Notes: producing little urination with frequent urges , Catheter draining well     VITAL SIGNS:      10/07/2017 02:35 PM  Weight 221 lb / 100.24 kg  Height 74 in / 187.96 cm  BP 130/73 mmHg  Heart Rate 72 /min  Temperature 97.4 F / 36.3 C  BMI 28.4 kg/m   MULTI-SYSTEM PHYSICAL EXAMINATION:    Constitutional: Well-nourished. No physical deformities. Normally developed. Good  grooming.  Respiratory: Normal breath sounds. No labored breathing, no use of accessory muscles.   Cardiovascular: Regular rate and rhythm. No murmur, no gallop. Normal temperature, normal extremity pulses, no swelling, no varicosities.   Gastrointestinal: No mass, no tenderness, no rigidity, non obese abdomen. SPT insertion site is clean.     PAST DATA REVIEWED:  Source Of History:  Patient  Records Review:   Previous Doctor Records, Previous Patient Records, POC Tool  X-Ray Review: Retrograde Urethrogram: Reviewed Films. Discussed With Patient.     04/01/12 06/12/09  PSA  Total PSA 0.14  0.32    Notes:                     The retrograde urethrogram performed in July demonstrates a bulbar urethral stricture that is approximately 15 mm long.   PROCEDURES: None   ASSESSMENT:      ICD-10 Details  1 GU:   Bulbar urethral stricture - N35.011    PLAN:           Orders Labs Urine Culture          Document Letter(s):  Created for Patient: Clinical Summary         Notes:   The  patient and I sat down, along with his wife, and reviewed the planned procedure to fix his urethra. We discussed the potential of excision and primary anastomosis versus using a buccal mucosal graft depending on the length of the stricture. I discussed with him the associated risks and the benefits of this operation. We discussed the timetable for return to work, his hospitalization, and postoperative catheter care. He understands all of the catheter for at least 2 weeks following the operation.   Fortunately, the patient's cardiac situation is now stabilized, and he has been cleared for surgery. He is scheduled next Friday. We will obtain a urine culture today and plan to start antibiotics 3 days prior to surgery.   Cc: Jarome Matin, M.D

## 2017-10-15 NOTE — Anesthesia Postprocedure Evaluation (Signed)
Anesthesia Post Note  Patient: MOHD. WIRTS  Procedure(s) Performed: URETHROPLASTY WITH BUCCAL GRAFT HARVAST (N/A Perineum)     Patient location during evaluation: PACU Anesthesia Type: General Level of consciousness: awake and alert Pain management: pain level controlled Vital Signs Assessment: post-procedure vital signs reviewed and stable Respiratory status: spontaneous breathing, nonlabored ventilation, respiratory function stable and patient connected to nasal cannula oxygen Cardiovascular status: blood pressure returned to baseline and stable Postop Assessment: no apparent nausea or vomiting Anesthetic complications: no    Last Vitals:  Vitals:   10/15/17 1330 10/15/17 1403  BP: 121/69 134/74  Pulse:  77  Resp:  16  Temp: 36.8 C 36.7 C  SpO2:  97%    Last Pain:  Vitals:   10/15/17 1457  TempSrc:   PainSc: 7                  Trevor Iha

## 2017-10-16 ENCOUNTER — Encounter (HOSPITAL_COMMUNITY): Payer: Self-pay | Admitting: Urology

## 2017-10-16 LAB — BASIC METABOLIC PANEL
Anion gap: 8 (ref 5–15)
BUN: 27 mg/dL — ABNORMAL HIGH (ref 8–23)
CO2: 27 mmol/L (ref 22–32)
Calcium: 9.3 mg/dL (ref 8.9–10.3)
Chloride: 101 mmol/L (ref 98–111)
Creatinine, Ser: 1.18 mg/dL (ref 0.61–1.24)
GFR calc Af Amer: >60 mL/min (ref >60)
GFR calc non Af Amer: >60 mL/min (ref >60)
Glucose, Bld: 130 mg/dL — ABNORMAL HIGH (ref 70–99)
Potassium: 5.3 mmol/L — ABNORMAL HIGH (ref 3.5–5.1)
Sodium: 136 mmol/L (ref 135–145)

## 2017-10-16 NOTE — Discharge Summary (Signed)
Date of admission: 10/15/2017  Date of discharge: 10/16/2017  Admission diagnosis: Urethral stricture  Discharge diagnosis: Urethral stricture  Secondary diagnoses: Hypertension  History and Physical: For full details, please see admission history and physical. Briefly, [name] is a 62 y.o. year old patient with a urethral stricture.   Hospital Course: He underwent urethroplasty with buccal mucosa onlay graft.  He tolerated this well without complications.  He remained stable and was able to be discharged home on POD # 1 with his catheter.  Laboratory values:  Recent Labs    10/15/17 1346  HGB 13.3  HCT 43.2   Recent Labs    10/15/17 1346 10/16/17 0516  CREATININE 1.29* 1.18    Disposition: Home  Discharge instruction: The patient was instructed to be ambulatory but told to refrain from heavy lifting, strenuous activity, or driving. He will avoid hot or spicy foods and will perform mouthwashes with Peridex.  Discharge medications:  Allergies as of 10/16/2017      Reactions   Ezetimibe Other (See Comments)   Joint pain and drops BP   Niacin Other (See Comments)   Drops BP and severe   Statins Other (See Comments)   Severe joint pain and drops BP.      Medication List    STOP taking these medications   ciprofloxacin 500 MG tablet Commonly known as:  CIPRO     TAKE these medications   aspirin 81 MG chewable tablet Chew 81 mg by mouth daily.   carvedilol 6.25 MG tablet Commonly known as:  COREG Take 1 tablet (6.25 mg total) by mouth 2 (two) times daily.   chlorhexidine 0.12 % solution Commonly known as:  PERIDEX Use as directed 15 mLs in the mouth or throat 2 (two) times daily for 3 days.   fenofibrate 145 MG tablet Commonly known as:  TRICOR TAKE 1 TABLET BY MOUTH EVERY DAY   gabapentin 300 MG capsule Commonly known as:  NEURONTIN Take 300 mg by mouth at bedtime.   lisinopril 2.5 MG tablet Commonly known as:  PRINIVIL,ZESTRIL Take 1 tablet  (2.5 mg total) by mouth daily.   loratadine 10 MG tablet Commonly known as:  CLARITIN Take 10 mg by mouth daily as needed for allergies.   MENS MULTIVITAMIN PLUS PO Take 1 tablet by mouth 2 (two) times daily.   MYRBETRIQ 50 MG Tb24 tablet Generic drug:  mirabegron ER Take 50 mg by mouth daily.   nitrofurantoin 100 MG capsule Commonly known as:  MACRODANTIN Take 1 capsule (100 mg total) by mouth 2 (two) times daily for 7 days.   REPATHA SURECLICK 140 MG/ML Soaj Generic drug:  Evolocumab Inject 140 mg into the skin every 14 (fourteen) days.   spironolactone 25 MG tablet Commonly known as:  ALDACTONE Take 0.5 tablets (12.5 mg total) by mouth daily.   traMADol 50 MG tablet Commonly known as:  ULTRAM Take 1-2 tablets (50-100 mg total) by mouth every 6 (six) hours as needed for moderate pain.   zolpidem 10 MG tablet Commonly known as:  AMBIEN Take 10 mg by mouth at bedtime.       Followup:  Follow-up Information    Crist Fat, MD On 10/29/2017.   Specialty:  Urology Why:  2:15pm Contact information: [address] [place] [address] [phone]

## 2017-10-16 NOTE — Progress Notes (Signed)
Patient ID: LAQUINCY BRY, male   DOB: October 01, 1955, 62 y.o.   MRN: [id number]  1 Day Post-Op Subjective: Pt doing well.  No complaints.  Pain controlled.  Tolerating diet.  Objective: Vital signs in last 24 hours: Temp:  [97.5 F (36.4 C)-98.5 F (36.9 C)] 97.6 F (36.4 C) (10/12 0452) Pulse Rate:  [67-89] 67 (10/12 0452) Resp:  [12-19] 18 (10/12 0452) BP: (100-138)/(63-88) 100/63 (10/12 0452) SpO2:  [95 %-100 %] 97 % (10/12 0452)  Intake/Output from previous day: 10/11 0701 - 10/12 0700 In: 2523.4 [I.V.:2074; IV Piggyback:449.5] Out: 1325 [Urine:1275; Blood:50] Intake/Output this shift: No intake/output data recorded.  Physical Exam:  General: Alert and oriented ENT: Buccal mucosa incision site healing well Incisions: C/D/I GU: Urine clear in catheter  Lab Results: Recent Labs    10/15/17 1346  HGB 13.3  HCT 43.2   BMET Recent Labs    10/15/17 1346 10/16/17 0516  NA 142 136  K 5.4* 5.3*  CL 107 101  CO2 27 27  GLUCOSE 127* 130*  BUN 28* 27*  CREATININE 1.29* 1.18  CALCIUM 9.6 9.3     Studies/Results: No results found.  Assessment/Plan: POD # 1 s/p urethroplasty with buccal mucosa onlay graft - D/C home with catheter   LOS: 1 day   Tayven Renteria,LES 10/16/2017, 7:41 AM

## 2017-10-25 DIAGNOSIS — I214 Non-ST elevation (NSTEMI) myocardial infarction: Secondary | ICD-10-CM | POA: Diagnosis not present

## 2017-10-25 DIAGNOSIS — Z951 Presence of aortocoronary bypass graft: Secondary | ICD-10-CM | POA: Diagnosis not present

## 2017-10-25 DIAGNOSIS — I472 Ventricular tachycardia: Secondary | ICD-10-CM | POA: Diagnosis not present

## 2017-10-25 DIAGNOSIS — I255 Ischemic cardiomyopathy: Secondary | ICD-10-CM | POA: Diagnosis not present

## 2017-10-29 ENCOUNTER — Ambulatory Visit (HOSPITAL_COMMUNITY)
Admission: RE | Admit: 2017-10-29 | Discharge: 2017-10-29 | Disposition: A | Discharge status: Home / Self Care | Payer: BLUE CROSS/BLUE SHIELD | Source: Ambulatory Visit | Attending: Cardiology | Admitting: Cardiology

## 2017-10-29 DIAGNOSIS — N35011 Post-traumatic bulbous urethral stricture: Secondary | ICD-10-CM | POA: Diagnosis not present

## 2017-10-29 DIAGNOSIS — I5022 Chronic systolic (congestive) heart failure: Secondary | ICD-10-CM

## 2017-10-29 DIAGNOSIS — E7800 Pure hypercholesterolemia, unspecified: Secondary | ICD-10-CM

## 2017-10-29 LAB — LIPID PANEL
Cholesterol: 109 mg/dL (ref 0–200)
HDL: 42 mg/dL (ref >40)
LDL Cholesterol: 40 mg/dL (ref 0–99)
Total CHOL/HDL Ratio: 2.6 RATIO
Triglycerides: 135 mg/dL (ref <150)
VLDL: 27 mg/dL (ref 0–40)

## 2017-10-29 LAB — BASIC METABOLIC PANEL
Anion gap: 6 (ref 5–15)
BUN: 20 mg/dL (ref 8–23)
CO2: 26 mmol/L (ref 22–32)
Calcium: 9.7 mg/dL (ref 8.9–10.3)
Chloride: 106 mmol/L (ref 98–111)
Creatinine, Ser: 1.27 mg/dL — ABNORMAL HIGH (ref 0.61–1.24)
GFR calc Af Amer: >60 mL/min (ref >60)
GFR calc non Af Amer: 58 mL/min — ABNORMAL LOW (ref >60)
Glucose, Bld: 107 mg/dL — ABNORMAL HIGH (ref 70–99)
Potassium: 3.9 mmol/L (ref 3.5–5.1)
Sodium: 138 mmol/L (ref 135–145)

## 2017-11-01 ENCOUNTER — Telehealth (HOSPITAL_COMMUNITY): Payer: Self-pay

## 2017-11-01 NOTE — Telephone Encounter (Signed)
Attempted to contact pt in regards to Cardiac Rehab - lm on vm °

## 2017-11-08 ENCOUNTER — Telehealth (HOSPITAL_COMMUNITY): Payer: Self-pay

## 2017-11-08 ENCOUNTER — Encounter (HOSPITAL_COMMUNITY): Payer: Self-pay

## 2017-11-08 NOTE — Telephone Encounter (Signed)
Attempted to call pt a 2nd time- LM ON VM ° °Mailed letter out °

## 2017-11-08 NOTE — Telephone Encounter (Signed)
Patient returned CR phone call, he stated he is only able to participate on Thursday's - Friday's. Adv pt days of CR classes and he stated he is not able to join at this time.  Closed referral

## 2017-11-25 DIAGNOSIS — I472 Ventricular tachycardia: Secondary | ICD-10-CM | POA: Diagnosis not present

## 2017-11-25 DIAGNOSIS — I214 Non-ST elevation (NSTEMI) myocardial infarction: Secondary | ICD-10-CM | POA: Diagnosis not present

## 2017-11-25 DIAGNOSIS — Z951 Presence of aortocoronary bypass graft: Secondary | ICD-10-CM | POA: Diagnosis not present

## 2017-11-25 DIAGNOSIS — I255 Ischemic cardiomyopathy: Secondary | ICD-10-CM | POA: Diagnosis not present

## 2017-11-30 ENCOUNTER — Other Ambulatory Visit: Payer: Self-pay | Admitting: Pharmacist

## 2017-11-30 ENCOUNTER — Telehealth: Payer: Self-pay | Admitting: Cardiology

## 2017-11-30 MED ORDER — EVOLOCUMAB 140 MG/ML ~~LOC~~ SOAJ: 140.0000 mg | SUBCUTANEOUS | 11 refills | Status: DC

## 2017-11-30 NOTE — Telephone Encounter (Signed)
New message    Pt is calling to find out if he is still a pt of Dr. Swaziland. He said he's been a patient of Dr. Swaziland for a while and went to the hospital back in June and saw Dr. Swaziland then. He said that he started seeing Dr. Shirlee Latch after leaving the hospital and is now being told that he is his heart doctor and not Dr. Swaziland. He would like clarification from Dr. Swaziland nurse. Please call.

## 2017-11-30 NOTE — Telephone Encounter (Signed)
Per pt request.. He would like to come back into the office to see Dr. Swaziland.. He has been followed by Dr. Shirlee Latch for CHF but has had problems with SOB while working out... He really wants to see DR. Swaziland again... He was suppose to be seen in the office with him 06/2017 but was in the hosp then and Dr. Swaziland was treating him there.Marland Kitchen Appt made for the pt 12/06/17.Marland Kitchen Pt was very appreciative.

## 2017-11-30 NOTE — Telephone Encounter (Signed)
PA renewal completed. Rx sent to [facility].  Patient to call back if problems

## 2017-11-30 NOTE — Telephone Encounter (Signed)
Glad to see him back.  Peter Swaziland MD, [facility]

## 2017-12-01 NOTE — Progress Notes (Signed)
[name] Date of Birth: 1955/04/22   History of Present Illness: [name] is seen for follow up CAD. He has a history of coronary disease and is status post stenting of the right coronary in 2009. He underwent cardiac catheterization in October 2012 which showed continued patency of the stents and nonobstructive coronary disease. Myoview at that time showed an inferior infarct with some peri-infarct ischemia.   He was admitted from 6/3 - 06/21/17. He presented with flash pulmonary edema. Ruled in for NSTEMI with peak trop I of 0.28. The underwent cardiac cath  which showed severe 3v CAD. Echo with EF 20-25%.  He was referred for CABG and this was performed on 4 06/14/17 by Dr. Donata Clay. He had to have a suprapubic catheter placed due to urethral stricture.   Admitted 6/18 - 06/25/17 after syncope in urology office. Noted to have wide complex tachycardia on telemetry. This was at a rate of 170 bpm and lasted about 30 beats. Echo showed EF 35-40%. LE venous doppler negative for DVT. CT negative for PE. Seen by EP. Beta blocker dose increased. Fitted with Lifevest. Unclear if arrhythmia was VT versus NSVT. Plan repeat Echo at 3 months and consider EP study.  Meds cut back on day of discharge due to mild hypotension and orthostasis. Since then he [facility] by Dr. Shirlee Latch. Repeat echo in 9/19 showed persistently low EF, 25-30%.  He has had a difficult time taking cardiac medications due to orthostatic symptoms/lightheadedness.  He did not tolerate Entresto and has been unable to increase Coreg from 6.25 mg bid. On 10/16/17 he underwent urethroplasty with buccal mucosa onlay graft for his urethral stricure. Suprapubic catheter removed. Other problems include HTN, HLD, and CKD stage 2.  On follow up today he reports good progress since CABG. He still notes significant tenderness from where he had LV epicardial lead tunneled. Feels breathing is getting better but  still not as good as before his surgery. He did not start aldactone as recommended by Dr. Shirlee Latch on last visit. Seems this was lost in the shuffle of his urologic surgery. His other complaint has been of persistent loose stools since his surgery. No abdominal pain. Weight at home has been stable at 218 lbs.     Current Outpatient Medications on File Prior to Visit  Medication Sig Dispense Refill  . aspirin 81 MG chewable tablet Chew 81 mg by mouth daily.    . carvedilol (COREG) 6.25 MG tablet Take 1 tablet (6.25 mg total) by mouth 2 (two) times daily. 180 tablet 3  . Evolocumab (REPATHA SURECLICK) 140 MG/ML SOAJ Inject 140 mg into the skin every 14 (fourteen) days. 2 pen 11  . fenofibrate (TRICOR) 145 MG tablet TAKE 1 TABLET BY MOUTH EVERY DAY (Patient taking differently: Take 145 mg by mouth daily. ) 90 tablet 3  . gabapentin (NEURONTIN) 300 MG capsule Take 300 mg by mouth at bedtime.  2  . lisinopril (PRINIVIL,ZESTRIL) 2.5 MG tablet Take 1 tablet (2.5 mg total) by mouth daily. 30 tablet 5  . loratadine (CLARITIN) 10 MG tablet Take 10 mg by mouth daily as needed for allergies.     . mirabegron ER (MYRBETRIQ) 50 MG TB24 tablet Take 50 mg by mouth daily.    . Multiple Vitamins-Minerals (MENS MULTIVITAMIN PLUS PO) Take 1 tablet by mouth 2 (two) times daily.    . traMADol (ULTRAM) 50 MG tablet Take 1-2 tablets (50-100 mg total) by  mouth every 6 (six) hours as needed for moderate pain. 30 tablet 0  . zolpidem (AMBIEN) 10 MG tablet Take 10 mg by mouth at bedtime.     No current facility-administered medications on file prior to visit.     Allergies  Allergen Reactions  . Ezetimibe Other (See Comments)    Joint pain and drops BP  . Niacin Other (See Comments)    Drops BP and severe  . Statins Other (See Comments)    Severe joint pain and drops BP.    Past Medical History:  Diagnosis Date  . Arthritis    "mild; back, neck, spine" (06/09/2017)  . CHF (congestive heart failure) (HCC)   .  Coronary artery disease   . History of gout   . History of kidney stones X 2  . Hypercholesterolemia   . Hypertension   . LBBB (left bundle branch block)   . MI (myocardial infarction) (HCC) 1993   LATERAL  . MI (myocardial infarction) (HCC) ?09/2001; 06/07/2017  . Pneumonia    "several times" (06/09/2017)  . S/P coronary artery stent placement    RCA  . Varicose veins     Past Surgical History:  Procedure Laterality Date  . ANTERIOR CERVICAL DECOMP/DISCECTOMY FUSION  03/03/2011   Procedure: ANTERIOR CERVICAL DECOMPRESSION/DISCECTOMY FUSION 3 LEVELS;  Surgeon: Karn Cassis, MD;  Location: [facility];  Service: Neurosurgery;  Laterality: N/A;  Cervical three-four Cervical four-five Cervical five-six Anterior cervical decompression/diskectomy, fusion  . BACK SURGERY    . CARDIAC CATHETERIZATION  2009   Stents in RCA patent. 70 to 80% PL, and 50% ostial PD.   Marland Kitchen CARDIAC CATHETERIZATION N/A 11/20/2014   Procedure: Left Heart Cath and Coronary Angiography;  Surgeon: Peter M Swaziland, MD;  Location: [facility] INVASIVE CV LAB;  Service: Cardiovascular;  Laterality: N/A;  . CORONARY ANGIOPLASTY     DIRECT ANGIOPLASTY THE MARGINAL BRANCH  . CORONARY ANGIOPLASTY WITH STENT PLACEMENT     RCA  . CORONARY ARTERY BYPASS GRAFT N/A 06/14/2017   Procedure: CORONARY ARTERY BYPASS GRAFTING (CABG) x four, using left internal mammary artery and right leg greater saphenous vein harvested endoscopically;  Surgeon: Kerin Perna, MD;  Location: [facility] OR;  Service: Open Heart Surgery;  Laterality: N/A;  . CYSTOSCOPY N/A 06/14/2017   Procedure: CYSTOSCOPY;  Surgeon: Donata Clay, Theron Arista, MD;  Location: [facility] OR;  Service: Open Heart Surgery;  Laterality: N/A;  . INSERTION OF SUPRAPUBIC CATHETER N/A 06/14/2017   Procedure: INSERTION OF SUPRAPUBIC CATHETER - Lower abdomen;  Surgeon: Kerin Perna, MD;  Location: [facility] OR;  Service: Open Heart Surgery;  Laterality: N/A;  . POSTERIOR LUMBAR FUSION  10/2011  . RIGHT/LEFT HEART  CATH AND CORONARY ANGIOGRAPHY N/A 06/08/2017   Procedure: RIGHT/LEFT HEART CATH AND CORONARY ANGIOGRAPHY;  Surgeon: Swaziland, Peter M, MD;  Location: [facility] INVASIVE CV LAB;  Service: Cardiovascular;  Laterality: N/A;  . TEE WITHOUT CARDIOVERSION N/A 06/14/2017   Procedure: TRANSESOPHAGEAL ECHOCARDIOGRAM (TEE);  Surgeon: Donata Clay, Theron Arista, MD;  Location: [facility] OR;  Service: Open Heart Surgery;  Laterality: N/A;  . URETHROPLASTY N/A 10/15/2017   Procedure: URETHROPLASTY WITH BUCCAL GRAFT HARVAST;  Surgeon: Crist Fat, MD;  Location: [facility] ORS;  Service: Urology;  Laterality: N/A;    Social History   Tobacco Use  Smoking Status Former Smoker  . Packs/day: 2.00  . Years: 20.00  . Pack years: 40.00  . Types: Cigarettes  . Last attempt to quit: 05/07/1999  . Years since quitting: 67.5  Smokeless Tobacco Never Used    Social History   Substance and Sexual Activity  Alcohol Use Yes   Comment: 06/09/2017 "1 beer/month; at most"    Family History  Problem Relation Age of Onset  . Heart attack Father   . Diabetes Father   . Breast cancer Mother   . Coronary artery disease Mother   . Heart disease Brother   . Heart attack Maternal Grandfather     Review of Systems: The review of systems is per the HPI.  All other systems were reviewed and are negative.  Physical Exam: BP (!) 152/80   Pulse 86   Ht 6\' 2"  (1.88 m)   Wt 223 lb (101.2 kg)   BMI 28.63 kg/m  GENERAL:  Well appearing overweight WM in NAD HEENT:  PERRL, EOMI, sclera are clear. Oropharynx is clear. NECK:  No jugular venous distention, carotid upstroke brisk and symmetric, no bruits, no thyromegaly or adenopathy LUNGS:  Clear to auscultation bilaterally CHEST:  Median sternotomy scar has healed well. Tenderness in left anterior chest along tunneled epicardial lead.  HEART:  RRR,  PMI not displaced or sustained,S1 and S2 within normal limits, no S3, no S4: no clicks, no rubs, no murmurs ABD:  Soft, nontender. BS +, no masses or  bruits. No hepatomegaly, no splenomegaly EXT:  2 + pulses throughout, no edema, no cyanosis no clubbing SKIN:  Warm and dry.  No rashes NEURO:  Alert and oriented x 3. Cranial nerves II through XII intact. PSYCH:  Cognitively intact    LABORATORY DATA:  Lab Results  Component Value Date   WBC 9.7 10/15/2017   HGB 13.3 10/15/2017   HCT 43.2 10/15/2017   PLT 264 10/15/2017   GLUCOSE 107 (H) 10/29/2017   CHOL 109 10/29/2017   TRIG 135 10/29/2017   HDL 42 10/29/2017   LDLCALC 40 10/29/2017   ALT 19 10/11/2017   AST 24 10/11/2017   NA 138 10/29/2017   K 3.9 10/29/2017   CL 106 10/29/2017   CREATININE 1.27 (H) 10/29/2017   BUN 20 10/29/2017   CO2 26 10/29/2017   TSH 3.983 06/10/2017   INR 1.28 06/14/2017   HGBA1C 5.9 (H) 06/10/2017    Ecg today shows  NSR, LBBB. No change. I have personally reviewed and interpreted this study.  Cardiac cath 06/08/17:  RIGHT/LEFT HEART CATH AND CORONARY ANGIOGRAPHY  Conclusion     Prox RCA lesion is 70% stenosed.  Mid RCA lesion is 50% stenosed.  Previously placed Mid RCA to Dist RCA stent (unknown type) is widely patent.  Previously placed Dist RCA stent (unknown type) is widely patent.  Post Atrio lesion is 50% stenosed.  Ost LAD to Prox LAD lesion is 99% stenosed.  Ost 1st Diag to 1st Diag lesion is 95% stenosed.  Ost Ramus to Ramus lesion is 80% stenosed.  Ost 1st Mrg lesion is 75% stenosed.  There is severe left ventricular systolic dysfunction.  LV end diastolic pressure is normal.  The left ventricular ejection fraction is less than 25% by visual estimate.  LV end diastolic pressure is normal.   1. Severe 3 vessel obstructive CAD    -99% ostial LAD with TIMI 1 flow. The vessel fills antegrade to the second diagonal. There are good right to left collaterals to the mid to distal LAD. The vessel appears large.    - 95% proximal first diagonal    - 80% proximal ramus intermediate    - 75% large OM1    -  70% proximal  RCA 2. Severe LV dysfunction. EF estimated at 20%. 3. Low LVEDP and PCWP c/w effective diuresis 4. Normal right heart pressures 5. Normal cardiac output. Index 3.28.   Plan: Recommend CT surgery consult for revascularization- message sent. Echo pending. The ostial LAD is not suitable for PCI. Optimize medical therapy for CHF. Will discontinue amlodipine. Start Coreg. Consider Entresto and aldactone. If pacemaker/ICD needed in the future would recommend using a left subclavian approach.     Echo 09/14/17: Study Conclusions  - Left ventricle: The cavity size was severely dilated. Wall   thickness was normal. Systolic function was severely reduced. The   estimated ejection fraction was in the range of 25% to 30%.   Diffuse hypokinesis. Doppler parameters are consistent with   restrictive physiology, indicative of decreased left ventricular   diastolic compliance and/or increased left atrial pressure. - Aortic valve: There was mild regurgitation. - Aorta: Ascending aortic diameter: 40 mm (S). - Left atrium: The atrium was mildly dilated.  Impressions:  - Severe global reduction in LV systolic function; severe LVE;   restrictive filling; mildly dilated ascending aorta; mild AI;   mild LAE.  Assessment / Plan: 1. Coronary disease status post stenting of the right coronary 2009. Presentation in June 2019 with acute pulmonary edema and NSTEMI. Cath showed severe 3 vessel CAD. Now  S/p CABG. No recurrent angina but really presented more with acute CHF.   2. Chronic systolic CHF. EF persistently low on optimal medical therapy and post revascularization. EF 25-30%. NYHA class 2. Difficulty with titration of medication due to low BP and symptoms. LBBB. Recommend adding aldactone as noted by Dr. Shirlee Latch. Start at 12.5 mg daily. Check BMET later this week and in 2 weeks. Recommend referral to EP for CRT-D therapy.   3. History of Syncope with WCT.   4. Hypercholesterolemia. Patient is  intolerant to multiple medications. He is on Repatha now.  Encourage weight loss and heart healthy diet. LDL 40 is at goal   5. Hypertension, controlled.  6. LBBB chronic  7. CKD stage 2  8. Urethral stricture.   We will follow up in 2 months. Will try and gradually titrate his CHF meds as BP tolerates. I do not know why he has loose stools. The fenofibrate is the most likely medication but he was on this prior to surgery. He has stopped his medication for a week without improvement. May need to have this assessed by primary care.

## 2017-12-06 ENCOUNTER — Ambulatory Visit (INDEPENDENT_AMBULATORY_CARE_PROVIDER_SITE_OTHER): Payer: BLUE CROSS/BLUE SHIELD | Admitting: Cardiology

## 2017-12-06 ENCOUNTER — Encounter: Payer: Self-pay | Admitting: Cardiology

## 2017-12-06 VITALS — BP 152/80 | HR 86 | Ht 74.0 in | Wt 223.0 lb

## 2017-12-06 DIAGNOSIS — I2510 Atherosclerotic heart disease of native coronary artery without angina pectoris: Secondary | ICD-10-CM

## 2017-12-06 DIAGNOSIS — I2583 Coronary atherosclerosis due to lipid rich plaque: Secondary | ICD-10-CM

## 2017-12-06 DIAGNOSIS — I5022 Chronic systolic (congestive) heart failure: Secondary | ICD-10-CM | POA: Diagnosis not present

## 2017-12-06 DIAGNOSIS — Z951 Presence of aortocoronary bypass graft: Secondary | ICD-10-CM | POA: Diagnosis not present

## 2017-12-06 DIAGNOSIS — I447 Left bundle-branch block, unspecified: Secondary | ICD-10-CM | POA: Diagnosis not present

## 2017-12-06 DIAGNOSIS — I10 Essential (primary) hypertension: Secondary | ICD-10-CM

## 2017-12-06 MED ORDER — SPIRONOLACTONE 25 MG PO TABS: 12.5000 mg | ORAL_TABLET | Freq: Every day | ORAL | 3 refills | Status: DC

## 2017-12-06 NOTE — Patient Instructions (Signed)
Start Aldactone 12.5 mg daily  We will refer you to Electrophysiology for consideration of ICD/pacemaker.

## 2017-12-07 ENCOUNTER — Other Ambulatory Visit: Payer: Self-pay | Admitting: Cardiology

## 2017-12-07 MED ORDER — SPIRONOLACTONE 25 MG PO TABS: 12.5000 mg | ORAL_TABLET | Freq: Every day | ORAL | 3 refills | Status: DC

## 2017-12-07 NOTE — Telephone Encounter (Signed)
°*  STAT* If patient is at the pharmacy, call can be transferred to refill team.   1. Which medications need to be refilled? (please list name of each medication and dose if known) New Prescription for Spironolactone  2. Which pharmacy/location (including street and city if local pharmacy) is medication to be sent to? [facility] [phone]  3. Do they need a 30 day or 90 day supply? 90 and refills

## 2017-12-07 NOTE — Telephone Encounter (Signed)
Rx request sent to pharmacy.  

## 2017-12-09 DIAGNOSIS — I2510 Atherosclerotic heart disease of native coronary artery without angina pectoris: Secondary | ICD-10-CM | POA: Diagnosis not present

## 2017-12-09 DIAGNOSIS — I447 Left bundle-branch block, unspecified: Secondary | ICD-10-CM | POA: Diagnosis not present

## 2017-12-09 DIAGNOSIS — I2583 Coronary atherosclerosis due to lipid rich plaque: Secondary | ICD-10-CM | POA: Diagnosis not present

## 2017-12-09 DIAGNOSIS — I5022 Chronic systolic (congestive) heart failure: Secondary | ICD-10-CM | POA: Diagnosis not present

## 2017-12-09 LAB — BASIC METABOLIC PANEL
BUN/Creatinine Ratio: 19 (ref 10–24)
BUN: 22 mg/dL (ref 8–27)
CO2: 21 mmol/L (ref 20–29)
Calcium: 10.1 mg/dL (ref 8.6–10.2)
Chloride: 104 mmol/L (ref 96–106)
Creatinine, Ser: 1.16 mg/dL (ref 0.76–1.27)
GFR calc Af Amer: 77 mL/min/{1.73_m2} (ref >59)
GFR calc non Af Amer: 67 mL/min/{1.73_m2} (ref >59)
Glucose: 69 mg/dL (ref 65–99)
Potassium: 5 mmol/L (ref 3.5–5.2)
Sodium: 139 mmol/L (ref 134–144)

## 2017-12-13 ENCOUNTER — Telehealth: Payer: Self-pay

## 2017-12-13 NOTE — Telephone Encounter (Signed)
Spoke to patient Dr.Jordan advised to keep appointment with Dr.Klein as planned.

## 2017-12-13 NOTE — Telephone Encounter (Signed)
Spoke to patient he stated he stopped taking Aldactone this past Saturday due to having dizziness every time he stood up.Stated he feels better today no dizziness when he stands up.B/P ranging 116-118/80.Message sent to Dr.Jordan for advice.

## 2017-12-13 NOTE — Telephone Encounter (Signed)
OK so noted. Difficult to titrate CHD meds due to this. That is why it is so important to see EP to see what we can do with resynchronization.  Peter Swaziland MD, [facility]

## 2017-12-21 ENCOUNTER — Encounter: Payer: Self-pay | Admitting: Internal Medicine

## 2017-12-21 ENCOUNTER — Ambulatory Visit (INDEPENDENT_AMBULATORY_CARE_PROVIDER_SITE_OTHER): Payer: BLUE CROSS/BLUE SHIELD | Admitting: Internal Medicine

## 2017-12-21 VITALS — BP 134/84 | HR 77 | Ht 74.0 in | Wt 220.2 lb

## 2017-12-21 DIAGNOSIS — I5022 Chronic systolic (congestive) heart failure: Secondary | ICD-10-CM

## 2017-12-21 DIAGNOSIS — I447 Left bundle-branch block, unspecified: Secondary | ICD-10-CM

## 2017-12-21 DIAGNOSIS — Z951 Presence of aortocoronary bypass graft: Secondary | ICD-10-CM

## 2017-12-21 MED ORDER — SACUBITRIL-VALSARTAN 24-26 MG PO TABS: 1.0000 | ORAL_TABLET | Freq: Two times a day (BID) | ORAL | 11 refills | Status: DC

## 2017-12-21 MED ORDER — CARVEDILOL 3.125 MG PO TABS: 3.1250 mg | ORAL_TABLET | Freq: Two times a day (BID) | ORAL | Status: DC

## 2017-12-21 NOTE — Progress Notes (Signed)
Patient Care Team: Jarome Matin, MD as PCP - General (Internal Medicine) Swaziland, Peter M, MD as PCP - Cardiology (Cardiology)   HPI  [name] is a 62 y.o. male Seen in follow-up from hospital where he underwent bypass surgery 6/19.  History of multiple PCI's dating back at least 2009.  DATE TEST EF   2016 Echo  40-45%   2/18 MYOVIEW 38%   6/19 LHC  20-25 % 3V CAD  6/19 6/19 20-25 %   9/19 Echo  25-*30%    Has minimal exercise intolerance, no edema    Some tenderness over the left breast--better over the last few months.  Not related to aldactone, which he was unable tolerate 2/2 dizziness   Not previously exposed to [facility]  Records and Results Reviewed   Past Medical History:  Diagnosis Date  . Arthritis    "mild; back, neck, spine" (06/09/2017)  . CHF (congestive heart failure) (HCC)   . Coronary artery disease   . History of gout   . History of kidney stones X 2  . Hypercholesterolemia   . Hypertension   . LBBB (left bundle branch block)   . MI (myocardial infarction) (HCC) 1993   LATERAL  . MI (myocardial infarction) (HCC) ?09/2001; 06/07/2017  . Pneumonia    "several times" (06/09/2017)  . S/P coronary artery stent placement    RCA  . Varicose veins     Past Surgical History:  Procedure Laterality Date  . ANTERIOR CERVICAL DECOMP/DISCECTOMY FUSION  03/03/2011   Procedure: ANTERIOR CERVICAL DECOMPRESSION/DISCECTOMY FUSION 3 LEVELS;  Surgeon: Karn Cassis, MD;  Location: [facility] NE[facility]O ORS;  Service: Neurosurgery;  Laterality: N/A;  Cervical three-four Cervical four-five Cervical five-six Anterior cervical decompression/diskectomy, fusion  . BACK SURGERY    . CARDIAC CATHETERIZATION  2009   Stents in RCA patent. 70 to 80% PL, and 50% ostial PD.   Marland Kitchen CARDIAC CATHETERIZATION N/A 11/20/2014   Procedure: Left Heart Cath and Coronary Angiography;  Surgeon: Peter M Swaziland, MD;  Location: [facility] INVASIVE CV LAB;  Service: Cardiovascular;  Laterality: N/A;    . CORONARY ANGIOPLASTY     DIRECT ANGIOPLASTY THE MARGINAL BRANCH  . CORONARY ANGIOPLASTY WITH STENT PLACEMENT     RCA  . CORONARY ARTERY BYPASS GRAFT N/A 06/14/2017   Procedure: CORONARY ARTERY BYPASS GRAFTING (CABG) x four, using left internal mammary artery and right leg greater saphenous vein harvested endoscopically;  Surgeon: Kerin Perna, MD;  Location: [facility] OR;  Service: Open Heart Surgery;  Laterality: N/A;  . CYSTOSCOPY N/A 06/14/2017   Procedure: CYSTOSCOPY;  Surgeon: Donata Clay, Theron Arista, MD;  Location: [facility] OR;  Service: Open Heart Surgery;  Laterality: N/A;  . INSERTION OF SUPRAPUBIC CATHETER N/A 06/14/2017   Procedure: INSERTION OF SUPRAPUBIC CATHETER - Lower abdomen;  Surgeon: Kerin Perna, MD;  Location: [facility] OR;  Service: Open Heart Surgery;  Laterality: N/A;  . POSTERIOR LUMBAR FUSION  10/2011  . RIGHT/LEFT HEART CATH AND CORONARY ANGIOGRAPHY N/A 06/08/2017   Procedure: RIGHT/LEFT HEART CATH AND CORONARY ANGIOGRAPHY;  Surgeon: Swaziland, Peter M, MD;  Location: [facility] INVASIVE CV LAB;  Service: Cardiovascular;  Laterality: N/A;  . TEE WITHOUT CARDIOVERSION N/A 06/14/2017   Procedure: TRANSESOPHAGEAL ECHOCARDIOGRAM (TEE);  Surgeon: Donata Clay, Theron Arista, MD;  Location: [facility] OR;  Service: Open Heart Surgery;  Laterality: N/A;  . URETHROPLASTY N/A 10/15/2017   Procedure: URETHROPLASTY WITH BUCCAL GRAFT HARVAST;  Surgeon: Crist Fat, MD;  Location: [facility] ORS;  Service: Urology;  Laterality: N/A;    Current Meds  Medication Sig  . aspirin 81 MG chewable tablet Chew 81 mg by mouth daily.  . carvedilol (COREG) 3.125 MG tablet Take 1 tablet (3.125 mg total) by mouth 2 (two) times daily.  . Evolocumab (REPATHA SURECLICK) 140 MG/ML SOAJ Inject 140 mg into the skin every 14 (fourteen) days.  . fenofibrate (TRICOR) 145 MG tablet TAKE 1 TABLET BY MOUTH EVERY DAY  . gabapentin (NEURONTIN) 300 MG capsule Take 300 mg by mouth at bedtime.  . Multiple Vitamins-Minerals (MENS MULTIVITAMIN PLUS PO) Take 1  tablet by mouth 2 (two) times daily.  Marland Kitchen zolpidem (AMBIEN) 10 MG tablet Take 10 mg by mouth at bedtime.  . [DISCONTINUED] carvedilol (COREG) 6.25 MG tablet Take 1 tablet (6.25 mg total) by mouth 2 (two) times daily.  . [DISCONTINUED] lisinopril (PRINIVIL,ZESTRIL) 2.5 MG tablet Take 1 tablet (2.5 mg total) by mouth daily.    Allergies  Allergen Reactions  . Ezetimibe Other (See Comments)    Joint pain and drops BP  . Niacin Other (See Comments)    Drops BP and severe  . Statins Other (See Comments)    Severe joint pain and drops BP.      Review of Systems negative except from HPI and PMH  Physical Exam BP 134/84   Pulse 77   Ht 6\' 2"  (1.88 m)   Wt 220 lb 3.2 oz (99.9 kg)   SpO2 95%   BMI 28.27 kg/m  Well developed and nourished in no acute distress HENT normal Neck supple with JVP-flat Clear Regular rate and rhythm, no murmurs or gallops Abd-soft with active BS No Clubbing cyanosis edema Skin-warm and dry A & Oriented  Grossly normal sensory and motor function   Radiology/Studies:  Chest x-ray was reviewed personally from 8/19 demonstrated a single epicardial lead  EKG: Sinus at 78 Interval 21/20/43 Left bundle branch block with notching V3-V6   Assessment and Plan:   Coronary artery disease status post CABG  Ischemic cardiomyopathy  Left bundle branch block  Hypertension-   Congestive heart failure class Ib-IIa   Patient has scant symptoms.  His ejection fraction has not been reassessed in about 3 months.  He has not been tried on [employer].  He was intolerant to spironolactone secondary to dizziness.  I am not sanguine about the [facility] but I think it is worth a try.  Hence, we will begin it on 24/26 in anticipation decrease his carvedilol from 6.25--3.125 twice daily.  We will plan to get echocardiogram in about a month either on the Entresto or not   Blood pressures today are 130 so we can be optimistic  His class I symptoms also impact the threshold  for ICD implantation.       Current medicines are reviewed at length with the patient today .  The patient does not  have concerns regarding medicines.

## 2017-12-21 NOTE — Patient Instructions (Signed)
Medication Instructions:  Your physician has recommended you make the following change in your medication:   1. Decrease you Carvedilol to 1/2 tablet, 3.125mg , two times per day 2. Stop your lisinopril 3. Begin Entresto, 24/26mg  tablet, two times per day.      Start your Entresto 36 hours after you have stopped your lisinopril.    Labwork: Your physician recommends that you return for lab work in:   4 weeks when you come in for your Echo  Testing/Procedures: Your physician has requested that you have an echocardiogram. Echocardiography is a painless test that uses sound waves to create images of your heart. It provides your doctor with information about the size and shape of your heart and how well your heart's chambers and valves are working. This procedure takes approximately one hour. There are no restrictions for this procedure.   Follow-Up:  Your physician recommends that you schedule a follow-up appointment based off your Echo results.   Any Other Special Instructions Will Be Listed Below (If Applicable).     If you need a refill on your cardiac medications before your next appointment, please call your pharmacy.

## 2017-12-22 ENCOUNTER — Other Ambulatory Visit: Payer: Self-pay | Admitting: Cardiology

## 2017-12-24 NOTE — Telephone Encounter (Signed)
Rx sent to pharmacy   

## 2018-01-03 ENCOUNTER — Other Ambulatory Visit (HOSPITAL_COMMUNITY): Payer: Self-pay | Admitting: Cardiology

## 2018-01-06 ENCOUNTER — Ambulatory Visit: Payer: BLUE CROSS/BLUE SHIELD | Admitting: Cardiothoracic Surgery

## 2018-01-12 ENCOUNTER — Telehealth: Payer: Self-pay | Admitting: Internal Medicine

## 2018-01-12 NOTE — Telephone Encounter (Signed)
Returned phone call. Had to leave voicemail- states patient was intolerant to statins. Left call back number for futher questions

## 2018-01-12 NOTE — Telephone Encounter (Signed)
New message   Pt c/o medication issue:  1. Name of Medication: repatha   2. How are you currently taking this medication (dosage and times per day)? N/a   3. Are you having a reaction (difficulty breathing--STAT)? no  4. What is your medication issue?needs clarification if the patient is continuing maximally tolerated conventional lipid lowering regiment? Please advise.

## 2018-01-17 ENCOUNTER — Other Ambulatory Visit: Payer: Self-pay

## 2018-01-17 MED ORDER — EVOLOCUMAB 140 MG/ML ~~LOC~~ SOAJ: 140.0000 mg | SUBCUTANEOUS | 11 refills | Status: DC

## 2018-01-17 NOTE — Telephone Encounter (Signed)
Called to let pt know that pa for repatha approved rx [facility] [address] Ginette Otto instructed pt on msg if encounter problems to call us back

## 2018-01-20 ENCOUNTER — Encounter: Payer: Self-pay | Admitting: Cardiology

## 2018-01-21 ENCOUNTER — Ambulatory Visit (HOSPITAL_COMMUNITY): Payer: BLUE CROSS/BLUE SHIELD | Attending: Cardiovascular Disease

## 2018-01-21 ENCOUNTER — Other Ambulatory Visit: Payer: Self-pay

## 2018-01-21 ENCOUNTER — Other Ambulatory Visit: Payer: BLUE CROSS/BLUE SHIELD | Admitting: *Deleted

## 2018-01-21 DIAGNOSIS — I447 Left bundle-branch block, unspecified: Secondary | ICD-10-CM

## 2018-01-21 DIAGNOSIS — I5022 Chronic systolic (congestive) heart failure: Secondary | ICD-10-CM

## 2018-01-21 DIAGNOSIS — Z951 Presence of aortocoronary bypass graft: Secondary | ICD-10-CM

## 2018-01-21 LAB — BASIC METABOLIC PANEL
BUN/Creatinine Ratio: 20 (ref 10–24)
BUN: 22 mg/dL (ref 8–27)
CO2: 23 mmol/L (ref 20–29)
Calcium: 10.1 mg/dL (ref 8.6–10.2)
Chloride: 102 mmol/L (ref 96–106)
Creatinine, Ser: 1.09 mg/dL (ref 0.76–1.27)
GFR calc Af Amer: 83 mL/min/{1.73_m2} (ref >59)
GFR calc non Af Amer: 72 mL/min/{1.73_m2} (ref >59)
Glucose: 64 mg/dL — ABNORMAL LOW (ref 65–99)
Potassium: 4.6 mmol/L (ref 3.5–5.2)
Sodium: 142 mmol/L (ref 134–144)

## 2018-01-27 ENCOUNTER — Telehealth: Payer: Self-pay

## 2018-01-27 NOTE — Telephone Encounter (Signed)
[facility] (Key: [id number])  Rx #: [phone]  Repatha SureClick 140MG /ML auto-injectors   Your request has been approved  Effective from 01/17/2018 through 01/16/2019.

## 2018-01-31 DIAGNOSIS — N35011 Post-traumatic bulbous urethral stricture: Secondary | ICD-10-CM | POA: Diagnosis not present

## 2018-02-01 ENCOUNTER — Ambulatory Visit (INDEPENDENT_AMBULATORY_CARE_PROVIDER_SITE_OTHER): Payer: BLUE CROSS/BLUE SHIELD | Admitting: Cardiothoracic Surgery

## 2018-02-01 ENCOUNTER — Other Ambulatory Visit: Payer: Self-pay | Admitting: *Deleted

## 2018-02-01 ENCOUNTER — Encounter: Payer: Self-pay | Admitting: Cardiothoracic Surgery

## 2018-02-01 VITALS — BP 141/81 | HR 88 | Resp 16 | Ht 74.0 in | Wt 218.0 lb

## 2018-02-01 DIAGNOSIS — Z951 Presence of aortocoronary bypass graft: Secondary | ICD-10-CM

## 2018-02-01 DIAGNOSIS — G8918 Other acute postprocedural pain: Secondary | ICD-10-CM

## 2018-02-01 DIAGNOSIS — R0789 Other chest pain: Secondary | ICD-10-CM | POA: Diagnosis not present

## 2018-02-01 MED ORDER — PREGABALIN 75 MG PO CAPS: 75.0000 mg | ORAL_CAPSULE | Freq: Every day | ORAL | 3 refills | Status: DC

## 2018-02-01 NOTE — Progress Notes (Signed)
PCP is Jarome Matin, MD Referring Provider is Jarome Matin, MD  Chief Complaint  Patient presents with  . Routine Post Op    s/p CABG 2019..c/o LEFT chest swelling per Dr. Graciela Husbands...not Mr. Blackham...he is just having continued soreness    HPI: Patient returns 6 months after urgent CABG x4.  Patient presented with heart failure, ischemic cardiomyopathy, and non-STEMI.  EF was 20%.  He also had a LV epicardial lead placed at the time of CABG in case he needed biventricular pacing.  He has had a fairly good functional recovery now back at work.  However his EF by echo is 25%.  He is followed by Dr. Marca Ancona in the heart failure clinic.  He is on Entresto and carvedilol.  He denies symptoms of heart failure.  His main complaint today is neuritic-dysesthetic chest wall pain over his incision.  The incision is completely healed and stable.  Last chest x-ray shows no migration or fracture of sternal wires. He has been taking Neurontin for several months without relief.  He states the pain is worse when he is laying down.  It is described as needles and pins and feels somewhat swollen.  This is typical for postthoracotomy neuritic type pain.  We will proceed with a chest CT scan to check the integrity of the sternal closure and the patient will stop his Neurontin and transition to oral Lyrica 75 mg p.o. nightly. Past Medical History:  Diagnosis Date  . Arthritis    "mild; back, neck, spine" (06/09/2017)  . CHF (congestive heart failure) (HCC)   . Coronary artery disease   . History of gout   . History of kidney stones X 2  . Hypercholesterolemia   . Hypertension   . LBBB (left bundle branch block)   . MI (myocardial infarction) (HCC) 1993   LATERAL  . MI (myocardial infarction) (HCC) ?09/2001; 06/07/2017  . Pneumonia    "several times" (06/09/2017)  . S/P coronary artery stent placement    RCA  . Varicose veins     Past Surgical History:  Procedure Laterality Date  . ANTERIOR  CERVICAL DECOMP/DISCECTOMY FUSION  03/03/2011   Procedure: ANTERIOR CERVICAL DECOMPRESSION/DISCECTOMY FUSION 3 LEVELS;  Surgeon: Karn Cassis, MD;  Location: [facility];  Service: Neurosurgery;  Laterality: N/A;  Cervical three-four Cervical four-five Cervical five-six Anterior cervical decompression/diskectomy, fusion  . BACK SURGERY    . CARDIAC CATHETERIZATION  2009   Stents in RCA patent. 70 to 80% PL, and 50% ostial PD.   Marland Kitchen CARDIAC CATHETERIZATION N/A 11/20/2014   Procedure: Left Heart Cath and Coronary Angiography;  Surgeon: Joncarlo Friberg M Swaziland, MD;  Location: [facility] INVASIVE CV LAB;  Service: Cardiovascular;  Laterality: N/A;  . CORONARY ANGIOPLASTY     DIRECT ANGIOPLASTY THE MARGINAL BRANCH  . CORONARY ANGIOPLASTY WITH STENT PLACEMENT     RCA  . CORONARY ARTERY BYPASS GRAFT N/A 06/14/2017   Procedure: CORONARY ARTERY BYPASS GRAFTING (CABG) x four, using left internal mammary artery and right leg greater saphenous vein harvested endoscopically;  Surgeon: Kerin Perna, MD;  Location: [facility] OR;  Service: Open Heart Surgery;  Laterality: N/A;  . CYSTOSCOPY N/A 06/14/2017   Procedure: CYSTOSCOPY;  Surgeon: Donata Clay, Theron Arista, MD;  Location: [facility] OR;  Service: Open Heart Surgery;  Laterality: N/A;  . INSERTION OF SUPRAPUBIC CATHETER N/A 06/14/2017   Procedure: INSERTION OF SUPRAPUBIC CATHETER - Lower abdomen;  Surgeon: Kerin Perna, MD;  Location: [facility] OR;  Service: Open Heart Surgery;  Laterality: N/A;  . POSTERIOR LUMBAR FUSION  10/2011  . RIGHT/LEFT HEART CATH AND CORONARY ANGIOGRAPHY N/A 06/08/2017   Procedure: RIGHT/LEFT HEART CATH AND CORONARY ANGIOGRAPHY;  Surgeon: Swaziland, Sumire Halbleib M, MD;  Location: [facility] INVASIVE CV LAB;  Service: Cardiovascular;  Laterality: N/A;  . TEE WITHOUT CARDIOVERSION N/A 06/14/2017   Procedure: TRANSESOPHAGEAL ECHOCARDIOGRAM (TEE);  Surgeon: Donata Clay, Theron Arista, MD;  Location: [facility] OR;  Service: Open Heart Surgery;  Laterality: N/A;  . URETHROPLASTY N/A 10/15/2017   Procedure:  URETHROPLASTY WITH BUCCAL GRAFT HARVAST;  Surgeon: Crist Fat, MD;  Location: [facility] ORS;  Service: Urology;  Laterality: N/A;    Family History  Problem Relation Age of Onset  . Heart attack Father   . Diabetes Father   . Breast cancer Mother   . Coronary artery disease Mother   . Heart disease Brother   . Heart attack Maternal Grandfather     Social History Social History   Tobacco Use  . Smoking status: Former Smoker    Packs/day: 2.00    Years: 20.00    Pack years: 40.00    Types: Cigarettes    Last attempt to quit: 05/07/1999    Years since quitting: 18.7  . Smokeless tobacco: Never Used  Substance Use Topics  . Alcohol use: Yes    Comment: 06/09/2017 "1 beer/month; at most"  . Drug use: Never    Current Outpatient Medications  Medication Sig Dispense Refill  . aspirin 81 MG chewable tablet Chew 81 mg by mouth daily.    . carvedilol (COREG) 6.25 MG tablet Take 3.125 mg by mouth 2 (two) times daily with a meal.    . Evolocumab (REPATHA SURECLICK) 140 MG/ML SOAJ Inject 140 mg into the skin every 14 (fourteen) days. 2 pen 11  . fenofibrate (TRICOR) 145 MG tablet TAKE 1 TABLET BY MOUTH EVERY DAY 90 tablet 1  . gabapentin (NEURONTIN) 300 MG capsule Take 300 mg by mouth at bedtime.  2  . Multiple Vitamins-Minerals (MENS MULTIVITAMIN PLUS PO) Take 1 tablet by mouth 2 (two) times daily.    . sacubitril-valsartan (ENTRESTO) 24-26 MG Take 1 tablet by mouth 2 (two) times daily. (Patient taking differently: Take 0.5 tablets by mouth 2 (two) times daily. ) 60 tablet 11  . zolpidem (AMBIEN) 10 MG tablet Take 10 mg by mouth at bedtime.    . pregabalin (LYRICA) 75 MG capsule Take 1 capsule (75 mg total) by mouth daily. Take at bedtime 30 capsule 3   No current facility-administered medications for this visit.     Allergies  Allergen Reactions  . Ezetimibe Other (See Comments)    Joint pain and drops BP  . Niacin Other (See Comments)    Drops BP and severe  . Statins Other  (See Comments)    Severe joint pain and drops BP.    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  BP (!) 141/81 (BP Location: Right Arm, Patient Position: Sitting, Cuff Size: Large)   Pulse 88   Resp 16   Ht 6\' 2"  (1.88 m)   Wt 218 lb (98.9 kg)   SpO2 100% Comment: RA  BMI 27.99 kg/m  Physical Exam Alert and comfortable Sternal incision well-healed and stable Heart rhythm regular without murmur Both lungs are clear No peripheral edema  Diagnostic Tests: Last chest x-ray was August 2019 showing sternal wires intact, stable cardiac silhouette and no significant pleural  effusion.  Impression: Sternal neuritic type pain in a 63 year old muscular male. Symptoms not responding to Neurontin. Switch to Lyrica 75 mg p.o. nightly Check CT scan of chest to evaluate the integrity of sternal closure and to rule out fibrous malunion.  Plan: Return after CT scan.   Mikey Bussing, MD [facility] [phone]

## 2018-02-02 ENCOUNTER — Ambulatory Visit: Payer: BLUE CROSS/BLUE SHIELD | Admitting: Cardiothoracic Surgery

## 2018-02-04 NOTE — Progress Notes (Signed)
[name] Date of Birth: Apr 23, 1955   History of Present Illness: [name] is seen for follow up CAD. He has a history of coronary disease and is status post stenting of the right coronary in 2009. He underwent cardiac catheterization in October 2012 which showed continued patency of the stents and nonobstructive coronary disease. Myoview at that time showed an inferior infarct with some peri-infarct ischemia.   He was admitted from 6/3 - 06/21/17. He presented with flash pulmonary edema. Ruled in for NSTEMI with peak trop I of 0.28. The underwent cardiac cath  which showed severe 3v CAD. Echo with EF 20-25%.  He was referred for CABG and this was performed on 4 06/14/17 by Dr. Donata Clay. He had to have a suprapubic catheter placed due to urethral stricture.   Admitted 6/18 - 06/25/17 after syncope in urology office. Noted to have wide complex tachycardia on telemetry. This was at a rate of 170 bpm and lasted about 30 beats. Echo showed EF 35-40%. LE venous doppler negative for DVT. CT negative for PE. Seen by EP. Beta blocker dose increased. Fitted with Lifevest. Unclear if arrhythmia was VT versus NSVT. Plan repeat Echo at 3 months and consider EP study.  Meds cut back on day of discharge due to mild hypotension and orthostasis. Since then he has been followed in [facility] by Dr. Shirlee Latch. Repeat echo in 9/19 showed persistently low EF, 25-30%.  He has had a difficult time taking cardiac medications due to orthostatic symptoms/lightheadedness.  He did not tolerate Entresto and has been unable to increase Coreg from 6.25 mg bid. On 10/16/17 he underwent urethroplasty with buccal mucosa onlay graft for his urethral stricure. Suprapubic catheter removed. Other problems include HTN, HLD, and CKD stage 2.  Since his last visit he was tried on aldactone but was unable to tolerate this due to hypotension. Seen by Dr. Graciela Husbands for consideration of CRT-D. He wanted to try Entresto again and  reduced his Coreg dose to 3.125 mg bid. On Entresto 24-26 mg 1/2 tablet bid. Any higher dose resulted in hypotension and dizziness. Repeat Echo showed no improvement in EF 25%. Also seen by Dr. Donata Clay for neuropathic sternal pain. Did not respond to gabapentin. Started Lyrica. CT chest/sternum pending.  On follow up today his main complaint is of persistent deep discomfort in left lower anterior chest. Otherwise he is doing very well. He does note SOB when it is cold outside. Got SOB washing his cars yesterday. He goes to the gym 4 days a week and does the elliptical for 45 minutes. Doesn't really notice problems with this. No edema.     Current Outpatient Medications on File Prior to Visit  Medication Sig Dispense Refill  . Ascorbic Acid (VITAMIN C) 1000 MG tablet Take 1,000 mg by mouth daily.    Marland Kitchen aspirin 81 MG chewable tablet Chew 81 mg by mouth daily.    . carvedilol (COREG) 6.25 MG tablet Take 3.125 mg by mouth 2 (two) times daily with a meal. 1/2 tablet BID    . Evolocumab (REPATHA SURECLICK) 140 MG/ML SOAJ Inject 140 mg into the skin every 14 (fourteen) days. 2 pen 11  . fenofibrate (TRICOR) 145 MG tablet TAKE 1 TABLET BY MOUTH EVERY DAY 90 tablet 1  . Multiple Vitamins-Minerals (MENS MULTIVITAMIN PLUS PO) Take 1 tablet by mouth 2 (two) times daily.    . sacubitril-valsartan (ENTRESTO) 24-26 MG Take 1 tablet by mouth 2 (two) times daily. (  Patient taking differently: Take 0.5 tablets by mouth 2 (two) times daily. ) 60 tablet 11  . zolpidem (AMBIEN) 10 MG tablet Take 10 mg by mouth at bedtime.     No current facility-administered medications on file prior to visit.     Allergies  Allergen Reactions  . Ezetimibe Other (See Comments)    Joint pain and drops BP  . Niacin Other (See Comments)    Drops BP and severe  . Statins Other (See Comments)    Severe joint pain and drops BP.    Past Medical History:  Diagnosis Date  . Arthritis    "mild; back, neck, spine" (06/09/2017)  .  CHF (congestive heart failure) (HCC)   . Coronary artery disease   . History of gout   . History of kidney stones X 2  . Hypercholesterolemia   . Hypertension   . LBBB (left bundle branch block)   . MI (myocardial infarction) (HCC) 1993   LATERAL  . MI (myocardial infarction) (HCC) ?09/2001; 06/07/2017  . Pneumonia    "several times" (06/09/2017)  . S/P coronary artery stent placement    RCA  . Varicose veins     Past Surgical History:  Procedure Laterality Date  . ANTERIOR CERVICAL DECOMP/DISCECTOMY FUSION  03/03/2011   Procedure: ANTERIOR CERVICAL DECOMPRESSION/DISCECTOMY FUSION 3 LEVELS;  Surgeon: Karn CassisErnesto M Botero, MD;  Location: [facility];  Service: Neurosurgery;  Laterality: N/A;  Cervical three-four Cervical four-five Cervical five-six Anterior cervical decompression/diskectomy, fusion  . BACK SURGERY    . CARDIAC CATHETERIZATION  2009   Stents in RCA patent. 70 to 80% PL, and 50% ostial PD.   Marland Kitchen. CARDIAC CATHETERIZATION N/A 11/20/2014   Procedure: Left Heart Cath and Coronary Angiography;  Surgeon: Peter M SwazilandJordan, MD;  [facility];  Service: Cardiovascular;  Laterality: N/A;  . CORONARY ANGIOPLASTY     DIRECT ANGIOPLASTY THE MARGINAL BRANCH  . CORONARY ANGIOPLASTY WITH STENT PLACEMENT     RCA  . CORONARY ARTERY BYPASS GRAFT N/A 06/14/2017   Procedure: CORONARY ARTERY BYPASS GRAFTING (CABG) x four, using left internal mammary artery and right leg greater saphenous vein harvested endoscopically;  Surgeon: Kerin PernaVan Trigt, Peter, MD;  [facility];  Service: Open Heart Surgery;  Laterality: N/A;  . CYSTOSCOPY N/A 06/14/2017   Procedure: CYSTOSCOPY;  Surgeon: Donata ClayVan Trigt, Theron AristaPeter, MD;  [facility];  Service: Open Heart Surgery;  Laterality: N/A;  . INSERTION OF SUPRAPUBIC CATHETER N/A 06/14/2017   Procedure: INSERTION OF SUPRAPUBIC CATHETER - Lower abdomen;  Surgeon: Kerin PernaVan Trigt, Peter, MD;  [facility];  Service: Open Heart Surgery;  Laterality: N/A;  . POSTERIOR  LUMBAR FUSION  10/2011  . RIGHT/LEFT HEART CATH AND CORONARY ANGIOGRAPHY N/A 06/08/2017   Procedure: RIGHT/LEFT HEART CATH AND CORONARY ANGIOGRAPHY;  Surgeon: SwazilandJordan, Peter M, MD;  [facility];  Service: Cardiovascular;  Laterality: N/A;  . TEE WITHOUT CARDIOVERSION N/A 06/14/2017   Procedure: TRANSESOPHAGEAL ECHOCARDIOGRAM (TEE);  Surgeon: Donata ClayVan Trigt, Theron AristaPeter, MD;  [facility];  Service: Open Heart Surgery;  Laterality: N/A;  . URETHROPLASTY N/A 10/15/2017   Procedure: URETHROPLASTY WITH BUCCAL GRAFT HARVAST;  Surgeon: Crist FatHerrick, Benjamin W, MD;  Location: [facility]S;  Service: Urology;  Laterality: N/A;    Social History   Tobacco Use  Smoking Status Former Smoker  . Packs/day: 2.00  . Years: 20.00  . Pack years: 40.00  . Types: Cigarettes  . Last attempt to quit: 05/07/1999  . Years since  quitting: 18.7  Smokeless Tobacco Never Used    Social History   Substance and Sexual Activity  Alcohol Use Yes   Comment: 06/09/2017 "1 beer/month; at most"    Family History  Problem Relation Age of Onset  . Heart attack Father   . Diabetes Father   . Breast cancer Mother   . Coronary artery disease Mother   . Heart disease Brother   . Heart attack Maternal Grandfather     Review of Systems: The review of systems is per the HPI.  All other systems were reviewed and are negative.  Physical Exam: BP 120/70   Pulse 84   Ht 6\' 2"  (1.88 m)   Wt 225 lb (102.1 kg)   BMI 28.89 kg/m  GENERAL:  Well appearing WM in NAD HEENT:  PERRL, EOMI, sclera are clear. Oropharynx is clear. NECK:  No jugular venous distention, carotid upstroke brisk and symmetric, no bruits, no thyromegaly or adenopathy LUNGS:  Clear to auscultation bilaterally CHEST:  Unremarkable HEART:  RRR,  PMI not displaced or sustained,S1 and S2 within normal limits, no S3, no S4: no clicks, no rubs, no murmurs ABD:  Soft, nontender. BS +, no masses or bruits. No hepatomegaly, no splenomegaly EXT:  2 + pulses  throughout, no edema, no cyanosis no clubbing SKIN:  Warm and dry.  No rashes NEURO:  Alert and oriented x 3. Cranial nerves II through XII intact. PSYCH:  Cognitively intact      LABORATORY DATA:  Lab Results  Component Value Date   WBC 9.7 10/15/2017   HGB 13.3 10/15/2017   HCT 43.2 10/15/2017   PLT 264 10/15/2017   GLUCOSE 64 (L) 01/21/2018   CHOL 109 10/29/2017   TRIG 135 10/29/2017   HDL 42 10/29/2017   LDLCALC 40 10/29/2017   ALT 19 10/11/2017   AST 24 10/11/2017   NA 142 01/21/2018   K 4.6 01/21/2018   CL 102 01/21/2018   CREATININE 1.09 01/21/2018   BUN 22 01/21/2018   CO2 23 01/21/2018   TSH 3.983 06/10/2017   INR 1.28 06/14/2017   HGBA1C 5.9 (H) 06/10/2017    Ecg today shows  NSR, LBBB. No change. I have personally reviewed and interpreted this study.  Cardiac cath 06/08/17:  RIGHT/LEFT HEART CATH AND CORONARY ANGIOGRAPHY  Conclusion     Prox RCA lesion is 70% stenosed.  Mid RCA lesion is 50% stenosed.  Previously placed Mid RCA to Dist RCA stent (unknown type) is widely patent.  Previously placed Dist RCA stent (unknown type) is widely patent.  Post Atrio lesion is 50% stenosed.  Ost LAD to Prox LAD lesion is 99% stenosed.  Ost 1st Diag to 1st Diag lesion is 95% stenosed.  Ost Ramus to Ramus lesion is 80% stenosed.  Ost 1st Mrg lesion is 75% stenosed.  There is severe left ventricular systolic dysfunction.  LV end diastolic pressure is normal.  The left ventricular ejection fraction is less than 25% by visual estimate.  LV end diastolic pressure is normal.   1. Severe 3 vessel obstructive CAD    -99% ostial LAD with TIMI 1 flow. The vessel fills antegrade to the second diagonal. There are good right to left collaterals to the mid to distal LAD. The vessel appears large.    - 95% proximal first diagonal    - 80% proximal ramus intermediate    - 75% large OM1    - 70% proximal RCA 2. Severe LV dysfunction. EF estimated at 20%.  3. Low  LVEDP and PCWP c/w effective diuresis 4. Normal right heart pressures 5. Normal cardiac output. Index 3.28.   Plan: Recommend CT surgery consult for revascularization- message sent. Echo pending. The ostial LAD is not suitable for PCI. Optimize medical therapy for CHF. Will discontinue amlodipine. Start Coreg. Consider Entresto and aldactone. If pacemaker/ICD needed in the future would recommend using a left subclavian approach.     Echo 09/14/17: Study Conclusions  - Left ventricle: The cavity size was severely dilated. Wall   thickness was normal. Systolic function was severely reduced. The   estimated ejection fraction was in the range of 25% to 30%.   Diffuse hypokinesis. Doppler parameters are consistent with   restrictive physiology, indicative of decreased left ventricular   diastolic compliance and/or increased left atrial pressure. - Aortic valve: There was mild regurgitation. - Aorta: Ascending aortic diameter: 40 mm (S). - Left atrium: The atrium was mildly dilated.  Impressions:  - Severe global reduction in LV systolic function; severe LVE;   restrictive filling; mildly dilated ascending aorta; mild AI;   mild LAE.  Echo 01/21/18: Study Conclusions  - Procedure narrative: Transthoracic echocardiography. Image   quality was adequate. The study was technically difficult. - Left ventricle: The cavity size was severely dilated. Wall   thickness was increased in a pattern of mild LVH. The estimated   ejection fraction was 25%. Diffuse hypokinesis. The study is not   technically sufficient to allow evaluation of LV diastolic   function. - Aortic valve: There was mild regurgitation. - Mitral valve: Calcified annulus. Mildly thickened leaflets . - Left atrium: The atrium was mildly dilated. - Atrial septum: No defect or patent foramen ovale was identified.   Assessment / Plan: 1. Coronary disease status post stenting of the right coronary 2009. Presentation in June  2019 with acute pulmonary edema and NSTEMI. Cath showed severe 3 vessel CAD. Now  S/p CABG. No recurrent angina.  2. Chronic systolic CHF. EF persistently low on optimal medical therapy and post revascularization. EF 25-30%. NYHA class 1-2. Difficulty with titration of medication due to low BP and symptoms. Now on minimal doses of Entresto and Coreg. LBBB. Intolerant of aldactone due to hypotension. Is to follow up with Dr. Graciela Husbands for  for CRT-D therapy.   3. History of Syncope with WCT.   4. Hypercholesterolemia. Patient is intolerant to multiple medications. He is on Repatha now.   LDL 40 is at goal   5. LBBB chronic  6. CKD stage 2  7. Sternal pain, neuropathic. Planning to start Lyrica today.

## 2018-02-07 ENCOUNTER — Telehealth: Payer: Self-pay

## 2018-02-07 ENCOUNTER — Ambulatory Visit (INDEPENDENT_AMBULATORY_CARE_PROVIDER_SITE_OTHER): Payer: BLUE CROSS/BLUE SHIELD | Admitting: Cardiology

## 2018-02-07 ENCOUNTER — Other Ambulatory Visit: Payer: Self-pay

## 2018-02-07 ENCOUNTER — Encounter: Payer: Self-pay | Admitting: Cardiology

## 2018-02-07 VITALS — BP 120/70 | HR 84 | Ht 74.0 in | Wt 225.0 lb

## 2018-02-07 DIAGNOSIS — Z951 Presence of aortocoronary bypass graft: Secondary | ICD-10-CM | POA: Diagnosis not present

## 2018-02-07 DIAGNOSIS — I2583 Coronary atherosclerosis due to lipid rich plaque: Secondary | ICD-10-CM

## 2018-02-07 DIAGNOSIS — I5022 Chronic systolic (congestive) heart failure: Secondary | ICD-10-CM

## 2018-02-07 DIAGNOSIS — I2510 Atherosclerotic heart disease of native coronary artery without angina pectoris: Secondary | ICD-10-CM | POA: Diagnosis not present

## 2018-02-07 DIAGNOSIS — I447 Left bundle-branch block, unspecified: Secondary | ICD-10-CM

## 2018-02-07 MED ORDER — PREGABALIN 75 MG PO CAPS: 75.0000 mg | ORAL_CAPSULE | Freq: Every day | ORAL | 3 refills | Status: DC

## 2018-02-07 NOTE — Telephone Encounter (Signed)
Contacted [facility] for new prescription, Lyrica 75 mg once at night call-in.  Patient stated he never receieved medication at his follow-up appointment with Dr. Donata Clay.  Patient also contacted and made aware of prescription call in.

## 2018-02-14 ENCOUNTER — Other Ambulatory Visit: Payer: Self-pay | Admitting: Internal Medicine

## 2018-02-14 ENCOUNTER — Encounter: Payer: Self-pay | Admitting: Internal Medicine

## 2018-02-14 ENCOUNTER — Ambulatory Visit (INDEPENDENT_AMBULATORY_CARE_PROVIDER_SITE_OTHER): Payer: BLUE CROSS/BLUE SHIELD | Admitting: Internal Medicine

## 2018-02-14 VITALS — BP 142/80 | HR 78 | Ht 74.0 in | Wt 227.4 lb

## 2018-02-14 DIAGNOSIS — I5043 Acute on chronic combined systolic (congestive) and diastolic (congestive) heart failure: Secondary | ICD-10-CM

## 2018-02-14 DIAGNOSIS — I5022 Chronic systolic (congestive) heart failure: Secondary | ICD-10-CM

## 2018-02-14 DIAGNOSIS — I447 Left bundle-branch block, unspecified: Secondary | ICD-10-CM

## 2018-02-14 NOTE — Progress Notes (Signed)
      Patient Care Team: Paterson, Daniel, MD as PCP - General (Internal Medicine) Jordan, Peter M, MD as PCP - Cardiology (Cardiology)   HPI  [name] is a 63 y.o. male Seen for reconsideration of CRT-ICD implant.  Initially seen 6/19 admitted with syncope and found to have  WCT -- 26 beats, 3 V CAD and LV dysfunction>>>Bypass surgery   History of multiple PCI's dating back at least 2009.  DATE TEST EF   2016 Echo  40-45%   2/18 MYOVIEW 38%   6/19 LHC  20-25 % 3V CAD  6/19 6/19 20-25 %   9/19 Echo  25-*30%   1/20 Echo  25%      Started on entresto without interval LV improvement.  Was unable to uptitrate his Entresto and her carvedilol further  Mild shortness of breath.  He is working out regularly.  No edema.  Continues to have chest wall tenderness.  No syncope or palpitations.   Records and Results Reviewed   Past Medical History:  Diagnosis Date  . Arthritis    "mild; back, neck, spine" (06/09/2017)  . CHF (congestive heart failure) (HCC)   . Coronary artery disease   . History of gout   . History of kidney stones X 2  . Hypercholesterolemia   . Hypertension   . LBBB (left bundle branch block)   . MI (myocardial infarction) (HCC) 1993   LATERAL  . MI (myocardial infarction) (HCC) ?09/2001; 06/07/2017  . Pneumonia    "several times" (06/09/2017)  . S/P coronary artery stent placement    RCA  . Varicose veins     Past Surgical History:  Procedure Laterality Date  . ANTERIOR CERVICAL DECOMP/DISCECTOMY FUSION  03/03/2011   Procedure: ANTERIOR CERVICAL DECOMPRESSION/DISCECTOMY FUSION 3 LEVELS;  Surgeon: Ernesto M Botero, MD;  Location: [facility];  Service: Neurosurgery;  Laterality: N/A;  Cervical three-four Cervical four-five Cervical five-six Anterior cervical decompression/diskectomy, fusion  . BACK SURGERY    . CARDIAC CATHETERIZATION  2009   Stents in RCA patent. 70 to 80% PL, and 50% ostial PD.   . CARDIAC CATHETERIZATION N/A 11/20/2014   Procedure: Left Heart Cath and Coronary Angiography;  Surgeon: Peter M Jordan, MD;  Location: [facility];  Service: Cardiovascular;  Laterality: N/A;  . CORONARY ANGIOPLASTY     DIRECT ANGIOPLASTY THE MARGINAL BRANCH  . CORONARY ANGIOPLASTY WITH STENT PLACEMENT     RCA  . CORONARY ARTERY BYPASS GRAFT N/A 06/14/2017   Procedure: CORONARY ARTERY BYPASS GRAFTING (CABG) x four, using left internal mammary artery and right leg greater saphenous vein harvested endoscopically;  Surgeon: Van Trigt, Peter, MD;  Location: [facility] OR;  Service: Open Heart Surgery;  Laterality: N/A;  . CYSTOSCOPY N/A 06/14/2017   Procedure: CYSTOSCOPY;  Surgeon: Van Trigt, Peter, MD;  Location: [facility] OR;  Service: Open Heart Surgery;  Laterality: N/A;  . INSERTION OF SUPRAPUBIC CATHETER N/A 06/14/2017   Procedure: INSERTION OF SUPRAPUBIC CATHETER - Lower abdomen;  Surgeon: Van Trigt, Peter, MD;  Location: [facility] OR;  Service: Open Heart Surgery;  Laterality: N/A;  . POSTERIOR LUMBAR FUSION  10/2011  . RIGHT/LEFT HEART CATH AND CORONARY ANGIOGRAPHY N/A 06/08/2017   Procedure: RIGHT/LEFT HEART CATH AND CORONARY ANGIOGRAPHY;  Surgeon: Jordan, Peter M, MD;  Location: [facility];  Service: Cardiovascular;  Laterality: N/A;  . TEE WITHOUT CARDIOVERSION N/A 06/14/2017   Procedure: TRANSESOPHAGEAL ECHOCARDIOGRAM (TEE);  Surgeon: Van Trigt, Peter, MD;  Location: [facility] OR;    Service: Open Heart Surgery;  Laterality: N/A;  . URETHROPLASTY N/A 10/15/2017   Procedure: URETHROPLASTY WITH BUCCAL GRAFT HARVAST;  Surgeon: Crist Fat, MD;  Location: [facility];  Service: Urology;  Laterality: N/A;    Current Meds  Medication Sig  . Ascorbic Acid (VITAMIN C) 1000 MG tablet Take 1,000 mg by mouth daily.  Marland Kitchen aspirin 81 MG chewable tablet Chew 81 mg by mouth daily.  . carvedilol (COREG) 6.25 MG tablet Take 3.125 mg by mouth 2 (two) times daily with a meal. 1/2 tablet BID  . Evolocumab (REPATHA SURECLICK) 140 MG/ML SOAJ Inject 140 mg into the  skin every 14 (fourteen) days.  . fenofibrate (TRICOR) 145 MG tablet TAKE 1 TABLET BY MOUTH EVERY DAY  . Multiple Vitamins-Minerals (MENS MULTIVITAMIN PLUS PO) Take 1 tablet by mouth 2 (two) times daily.  . pregabalin (LYRICA) 75 MG capsule Take 1 capsule (75 mg total) by mouth daily. Take at bedtime  . sacubitril-valsartan (ENTRESTO) 24-26 MG Take 0.5 tablets by mouth 2 (two) times daily.  Marland Kitchen zolpidem (AMBIEN) 10 MG tablet Take 10 mg by mouth at bedtime.    Allergies  Allergen Reactions  . Ezetimibe Other (See Comments)    Joint pain and drops BP  . Niacin Other (See Comments)    Drops BP and severe  . Statins Other (See Comments)    Severe joint pain and drops BP.      Review of Systems negative except from HPI and PMH  Physical Exam BP (!) 142/80   Pulse 78   Ht 6\' 2"  (1.88 m)   Wt 227 lb 6.4 oz (103.1 kg)   SpO2 94%   BMI 29.20 kg/m  Well developed and nourished in no acute distress HENT normal Neck supple with JVP-flat Clear Lead pocket is rather lateral.  X-ray was reviewed with the patient today the coiling is counterclockwise.  There is a great deal of slack. Chest wall tenderness. Regular rate and rhythm, no murmurs or gallops Abd-soft with active BS No Clubbing cyanosis edema Skin-warm and dry A & Oriented  Grossly normal sensory and motor function    Radiology/Studies:  Chest x-ray was reviewed personally from 8/19 demonstrated a single epicardial lead  EKG: Sinus at 78 12/19  Not repeated  Interval 21/20/43 Left bundle branch block with notching V3-V6   Assessment and Plan:   Coronary artery disease status post CABG  Ischemic cardiomyopathy  Left bundle branch block  Hypertension  Chest pain   Congestive heart failure class Ib-IIa   Patient has persistent left ventricular dysfunction with left bundle branch block.  The plan would be to implant a dual-chamber ICD with pirating of the previously implanted LV epicardial lead.   Euvolemic  continue current meds  Without symptoms of ischemia  Dr. Orlene Plum note was reviewed.  His feeling is that the pain is neuritic/dysesthetic.  He has a chest CT scheduled 2/26.  Have reviewed the potential benefits and risks of ICD implantation including but not limited to death, perforation of heart or lung, lead dislodgement, infection,  device malfunction and inappropriate shocks.  The patient express understanding  and are willing to proceed.    We spent more than 50% of our >25 min visit in face to face counseling regarding the above       Current medicines are reviewed at length with the patient today .  The patient does not  have concerns regarding medicines.

## 2018-02-14 NOTE — Patient Instructions (Addendum)
Medication Instructions:  Your physician recommends that you continue on your current medications as directed. Please refer to the Current Medication list given to you today.  Labwork: Your physician recommends that you return for lab work the week of Feb 24 for pre procedural lab work:  CBC and BMP  Testing/Procedures: Your physician has recommended that you have a defibrillator inserted. An implantable cardioverter defibrillator (ICD) is a small device that is placed in your chest or, in rare cases, your abdomen. This device uses electrical pulses or shocks to help control life-threatening, irregular heartbeats that could lead the heart to suddenly stop beating (sudden cardiac arrest). Leads are attached to the ICD that goes into your heart. This is done in the hospital and usually requires an overnight stay. Please see the instruction sheet given to you today for more information.   Follow-Up: Your physician recommends that you schedule a follow-up appointment in:   10-14 days after March 6th with the device clinic 91 days after March 6th with Dr Graciela Husbands for a device check  Any Other Special Instructions Will Be Listed Below (If Applicable).     If you need a refill on your cardiac medications before your next appointment, please call your pharmacy.

## 2018-02-14 NOTE — H&P (View-Only) (Signed)
Patient Care Team: Jarome Matin, MD as PCP - General (Internal Medicine) [place], Peter M, MD as PCP - Cardiology (Cardiology)   HPI  [name] is a 63 y.o. male Seen for reconsideration of CRT-ICD implant.  Initially seen 6/19 admitted with syncope and found to have  WCT -- 26 beats, 3 V CAD and LV dysfunction>>>Bypass surgery   History of multiple PCI's dating back at least 2009.  DATE TEST EF   2016 Echo  40-45%   2/18 MYOVIEW 38%   6/19 LHC  20-25 % 3V CAD  6/19 6/19 20-25 %   9/19 Echo  25-*30%   1/20 Echo  25%      Started on entresto without interval LV improvement.  Was unable to uptitrate his Entresto and her carvedilol further  Mild shortness of breath.  He is working out regularly.  No edema.  Continues to have chest wall tenderness.  No syncope or palpitations.   Records and Results Reviewed   Past Medical History:  Diagnosis Date  . Arthritis    "mild; back, neck, spine" (06/09/2017)  . CHF (congestive heart failure) (HCC)   . Coronary artery disease   . History of gout   . History of kidney stones X 2  . Hypercholesterolemia   . Hypertension   . LBBB (left bundle branch block)   . MI (myocardial infarction) (HCC) 1993   LATERAL  . MI (myocardial infarction) (HCC) ?09/2001; 06/07/2017  . Pneumonia    "several times" (06/09/2017)  . S/P coronary artery stent placement    RCA  . Varicose veins     Past Surgical History:  Procedure Laterality Date  . ANTERIOR CERVICAL DECOMP/DISCECTOMY FUSION  03/03/2011   Procedure: ANTERIOR CERVICAL DECOMPRESSION/DISCECTOMY FUSION 3 LEVELS;  Surgeon: Karn Cassis, MD;  Location: [facility];  Service: Neurosurgery;  Laterality: N/A;  Cervical three-four Cervical four-five Cervical five-six Anterior cervical decompression/diskectomy, fusion  . BACK SURGERY    . CARDIAC CATHETERIZATION  2009   Stents in RCA patent. 70 to 80% PL, and 50% ostial PD.   Marland Kitchen CARDIAC CATHETERIZATION N/A 11/20/2014   Procedure: Left Heart Cath and Coronary Angiography;  Surgeon: Peter M [place]iland, MD;  Location[facility] INVASIVE CV LAB;  Service: Cardiovascular;  Laterality: N/A;  . CORONARY ANGIOPLASTY     DIRECT ANGIOPLASTY THE MARGINAL BRANCH  . CORONARY ANGIOPLASTY WITH STENT PLACEMENT     RCA  . CORONARY ARTERY BYPASS GRAFT N/A 06/14/2017   Procedure: CORONARY ARTERY BYPASS GRAFTING (CABG) x four, using left internal mammary artery and right leg greater saphenous vein harvested endoscopically;  Surgeon: Kerin Perna, MD;  Location: [facility] OR;  Service: Open Heart Surgery;  Laterality: N/A;  . CYSTOSCOPY N/A 06/14/2017   Procedure: CYSTOSCOPY;  Surgeon: Donata Clay, Theron Arista, MD;  Location: [facility] OR;  Service: Open Heart Surgery;  Laterality: N/A;  . INSERTION OF SUPRAPUBIC CATHETER N/A 06/14/2017   Procedure: INSERTION OF SUPRAPUBIC CATHETER - Lower abdomen;  Surgeon: Kerin Perna, MD;  Location: [facility] OR;  Service: Open Heart Surgery;  Laterality: N/A;  . POSTERIOR LUMBAR FUSION  10/2011  . RIGHT/LEFT HEART CATH AND CORONARY ANGIOGRAPHY N/A 06/08/2017   Procedure: RIGHT/LEFT HEART CATH AND CORONARY ANGIOGRAPHY;  Surgeon: [place]il[place], Peter M, MD;  Location: [facility] INVASIVE CV LAB;  Service: Cardiovascular;  Laterality: N/A;  . TEE WITHOUT CARDIOVERSION N/A 06/14/2017   Procedure: TRANSESOPHAGEAL ECHOCARDIOGRAM (TEE);  Surgeon: Donata Clay, Theron Arista, MD;  Location: [facility] OR;  Service: Open Heart Surgery;  Laterality: N/A;  . URETHROPLASTY N/A 10/15/2017   Procedure: URETHROPLASTY WITH BUCCAL GRAFT HARVAST;  Surgeon: Crist Fat, MD;  Location: [facility] ORS;  Service: Urology;  Laterality: N/A;    Current Meds  Medication Sig  . Ascorbic Acid (VITAMIN C) 1000 MG tablet Take 1,000 mg by mouth daily.  Marland Kitchen aspirin 81 MG chewable tablet Chew 81 mg by mouth daily.  . carvedilol (COREG) 6.25 MG tablet Take 3.125 mg by mouth 2 (two) times daily with a meal. 1/2 tablet BID  . Evolocumab (REPATHA SURECLICK) 140 MG/ML SOAJ Inject 140 mg into the  skin every 14 (fourteen) days.  . fenofibrate (TRICOR) 145 MG tablet TAKE 1 TABLET BY MOUTH EVERY DAY  . Multiple Vitamins-Minerals (MENS MULTIVITAMIN PLUS PO) Take 1 tablet by mouth 2 (two) times daily.  . pregabalin (LYRICA) 75 MG capsule Take 1 capsule (75 mg total) by mouth daily. Take at bedtime  . sacubitril-valsartan (ENTRESTO) 24-26 MG Take 0.5 tablets by mouth 2 (two) times daily.  Marland Kitchen zolpidem (AMBIEN) 10 MG tablet Take 10 mg by mouth at bedtime.    Allergies  Allergen Reactions  . Ezetimibe Other (See Comments)    Joint pain and drops BP  . Niacin Other (See Comments)    Drops BP and severe  . Statins Other (See Comments)    Severe joint pain and drops BP.      Review of Systems negative except from HPI and PMH  Physical Exam BP (!) 142/80   Pulse 78   Ht 6\' 2"  (1.88 m)   Wt 227 lb 6.4 oz (103.1 kg)   SpO2 94%   BMI 29.20 kg/m  Well developed and nourished in no acute distress HENT normal Neck supple with JVP-flat Clear Lead pocket is rather lateral.  X-ray was reviewed with the patient today the coiling is counterclockwise.  There is a great deal of slack. Chest wall tenderness. Regular rate and rhythm, no murmurs or gallops Abd-soft with active BS No Clubbing cyanosis edema Skin-warm and dry A & Oriented  Grossly normal sensory and motor function    Radiology/Studies:  Chest x-ray was reviewed personally from 8/19 demonstrated a single epicardial lead  EKG: Sinus at 78 12/19  Not repeated  Interval 21/20/43 Left bundle branch block with notching V3-V6   Assessment and Plan:   Coronary artery disease status post CABG  Ischemic cardiomyopathy  Left bundle branch block  Hypertension  Chest pain   Congestive heart failure class Ib-IIa   Patient has persistent left ventricular dysfunction with left bundle branch block.  The plan would be to implant a dual-chamber ICD with pirating of the previously implanted LV epicardial lead.   Euvolemic  continue current meds  Without symptoms of ischemia  Dr. Orlene Plum note was reviewed.  His feeling is that the pain is neuritic/dysesthetic.  He has a chest CT scheduled 2/26.  Have reviewed the potential benefits and risks of ICD implantation including but not limited to death, perforation of heart or lung, lead dislodgement, infection,  device malfunction and inappropriate shocks.  The patient express understanding  and are willing to proceed.    We spent more than 50% of our >25 min visit in face to face counseling regarding the above       Current medicines are reviewed at length with the patient today .  The patient does not  have concerns regarding medicines.

## 2018-03-02 ENCOUNTER — Other Ambulatory Visit: Payer: BLUE CROSS/BLUE SHIELD

## 2018-03-02 ENCOUNTER — Other Ambulatory Visit: Payer: Self-pay | Admitting: Family Medicine

## 2018-03-02 ENCOUNTER — Ambulatory Visit: Payer: BLUE CROSS/BLUE SHIELD | Admitting: Cardiothoracic Surgery

## 2018-03-04 ENCOUNTER — Other Ambulatory Visit: Payer: BLUE CROSS/BLUE SHIELD | Admitting: *Deleted

## 2018-03-04 DIAGNOSIS — I5043 Acute on chronic combined systolic (congestive) and diastolic (congestive) heart failure: Secondary | ICD-10-CM | POA: Diagnosis not present

## 2018-03-04 DIAGNOSIS — I447 Left bundle-branch block, unspecified: Secondary | ICD-10-CM | POA: Diagnosis not present

## 2018-03-04 DIAGNOSIS — I5022 Chronic systolic (congestive) heart failure: Secondary | ICD-10-CM | POA: Diagnosis not present

## 2018-03-04 LAB — BASIC METABOLIC PANEL
BUN/Creatinine Ratio: 23 (ref 10–24)
BUN: 24 mg/dL (ref 8–27)
CO2: 23 mmol/L (ref 20–29)
Calcium: 9.7 mg/dL (ref 8.6–10.2)
Chloride: 102 mmol/L (ref 96–106)
Creatinine, Ser: 1.06 mg/dL (ref 0.76–1.27)
GFR calc Af Amer: 86 mL/min/{1.73_m2} (ref >59)
GFR calc non Af Amer: 74 mL/min/{1.73_m2} (ref >59)
Glucose: 92 mg/dL (ref 65–99)
Potassium: 4.7 mmol/L (ref 3.5–5.2)
Sodium: 138 mmol/L (ref 134–144)

## 2018-03-04 LAB — CBC
Hematocrit: 43 % (ref 37.5–51.0)
Hemoglobin: 13.9 g/dL (ref 13.0–17.7)
MCH: 29.6 pg (ref 26.6–33.0)
MCHC: 32.3 g/dL (ref 31.5–35.7)
MCV: 92 fL (ref 79–97)
Platelets: 314 10*3/uL (ref 150–450)
RBC: 4.69 x10E6/uL (ref 4.14–5.80)
RDW: 13.5 % (ref 11.6–15.4)
WBC: 6.8 10*3/uL (ref 3.4–10.8)

## 2018-03-11 ENCOUNTER — Other Ambulatory Visit: Payer: Self-pay

## 2018-03-11 ENCOUNTER — Encounter (HOSPITAL_COMMUNITY): Payer: Self-pay | Admitting: Internal Medicine

## 2018-03-11 ENCOUNTER — Telehealth: Payer: Self-pay | Admitting: Internal Medicine

## 2018-03-11 ENCOUNTER — Ambulatory Visit (HOSPITAL_COMMUNITY)
Admission: RE | Admit: 2018-03-11 | Discharge: 2018-03-11 | Disposition: A | Discharge status: Home / Self Care | Payer: BLUE CROSS/BLUE SHIELD | Attending: Internal Medicine | Admitting: Internal Medicine

## 2018-03-11 ENCOUNTER — Ambulatory Visit (HOSPITAL_COMMUNITY): Payer: BLUE CROSS/BLUE SHIELD

## 2018-03-11 ENCOUNTER — Encounter (HOSPITAL_COMMUNITY)
Admission: RE | Disposition: A | Discharge status: Home / Self Care | Payer: Self-pay | Source: Home / Self Care | Attending: Internal Medicine

## 2018-03-11 DIAGNOSIS — Z95810 Presence of automatic (implantable) cardiac defibrillator: Secondary | ICD-10-CM | POA: Diagnosis not present

## 2018-03-11 DIAGNOSIS — I447 Left bundle-branch block, unspecified: Secondary | ICD-10-CM

## 2018-03-11 DIAGNOSIS — Z955 Presence of coronary angioplasty implant and graft: Secondary | ICD-10-CM | POA: Diagnosis not present

## 2018-03-11 DIAGNOSIS — Z7982 Long term (current) use of aspirin: Secondary | ICD-10-CM | POA: Diagnosis not present

## 2018-03-11 DIAGNOSIS — Z79899 Other long term (current) drug therapy: Secondary | ICD-10-CM | POA: Diagnosis not present

## 2018-03-11 DIAGNOSIS — M1990 Unspecified osteoarthritis, unspecified site: Secondary | ICD-10-CM | POA: Diagnosis not present

## 2018-03-11 DIAGNOSIS — I429 Cardiomyopathy, unspecified: Secondary | ICD-10-CM | POA: Diagnosis not present

## 2018-03-11 DIAGNOSIS — Z888 Allergy status to other drugs, medicaments and biological substances status: Secondary | ICD-10-CM | POA: Insufficient documentation

## 2018-03-11 DIAGNOSIS — E7800 Pure hypercholesterolemia, unspecified: Secondary | ICD-10-CM | POA: Diagnosis not present

## 2018-03-11 DIAGNOSIS — M109 Gout, unspecified: Secondary | ICD-10-CM | POA: Diagnosis not present

## 2018-03-11 DIAGNOSIS — I110 Hypertensive heart disease with heart failure: Secondary | ICD-10-CM | POA: Diagnosis not present

## 2018-03-11 DIAGNOSIS — I509 Heart failure, unspecified: Secondary | ICD-10-CM | POA: Diagnosis not present

## 2018-03-11 DIAGNOSIS — I252 Old myocardial infarction: Secondary | ICD-10-CM | POA: Insufficient documentation

## 2018-03-11 DIAGNOSIS — J90 Pleural effusion, not elsewhere classified: Secondary | ICD-10-CM | POA: Insufficient documentation

## 2018-03-11 DIAGNOSIS — I700 Atherosclerosis of aorta: Secondary | ICD-10-CM | POA: Insufficient documentation

## 2018-03-11 DIAGNOSIS — I2510 Atherosclerotic heart disease of native coronary artery without angina pectoris: Secondary | ICD-10-CM | POA: Insufficient documentation

## 2018-03-11 DIAGNOSIS — J984 Other disorders of lung: Secondary | ICD-10-CM | POA: Diagnosis not present

## 2018-03-11 DIAGNOSIS — I255 Ischemic cardiomyopathy: Secondary | ICD-10-CM | POA: Insufficient documentation

## 2018-03-11 DIAGNOSIS — Z951 Presence of aortocoronary bypass graft: Secondary | ICD-10-CM | POA: Diagnosis not present

## 2018-03-11 DIAGNOSIS — Z006 Encounter for examination for normal comparison and control in clinical research program: Secondary | ICD-10-CM | POA: Insufficient documentation

## 2018-03-11 HISTORY — PX: ICD IMPLANT: EP1208

## 2018-03-11 LAB — SURGICAL PCR SCREEN
MRSA, PCR: NEGATIVE
Staphylococcus aureus: NEGATIVE

## 2018-03-11 SURGERY — ICD IMPLANT

## 2018-03-11 MED ORDER — CEFAZOLIN SODIUM-DEXTROSE 1-4 GM/50ML-% IV SOLN
1.0000 g | Freq: Four times a day (QID) | INTRAVENOUS | Status: AC
  Administered 2018-03-11: 1 g via INTRAVENOUS
  Filled 2018-03-11 (×3): qty 50

## 2018-03-11 MED ORDER — CHLORHEXIDINE GLUCONATE 4 % EX LIQD
60.0000 mL | Freq: Once | CUTANEOUS | Status: DC
  Filled 2018-03-11: qty 60

## 2018-03-11 MED ORDER — PREDNISONE 10 MG (21) PO TBPK: ORAL_TABLET | ORAL | 0 refills | Status: DC

## 2018-03-11 MED ORDER — SODIUM CHLORIDE 0.9 % IV SOLN
INTRAVENOUS | Status: DC
  Administered 2018-03-11: 07:00:00 via INTRAVENOUS

## 2018-03-11 MED ORDER — ACETAMINOPHEN 325 MG PO TABS
325.0000 mg | ORAL_TABLET | ORAL | Status: DC | PRN
  Administered 2018-03-11: 650 mg via ORAL
  Filled 2018-03-11 (×4): qty 2

## 2018-03-11 MED ORDER — HEPARIN (PORCINE) IN NACL 1000-0.9 UT/500ML-% IV SOLN
INTRAVENOUS | Status: DC | PRN
  Administered 2018-03-11: 500 mL

## 2018-03-11 MED ORDER — MIDAZOLAM HCL 5 MG/5ML IJ SOLN
INTRAMUSCULAR | Status: DC | PRN
  Administered 2018-03-11: 1 mg via INTRAVENOUS
  Administered 2018-03-11: 2 mg via INTRAVENOUS
  Administered 2018-03-11 (×3): 1 mg via INTRAVENOUS

## 2018-03-11 MED ORDER — MIDAZOLAM HCL 5 MG/5ML IJ SOLN
INTRAMUSCULAR | Status: AC
  Filled 2018-03-11: qty 5

## 2018-03-11 MED ORDER — LIDOCAINE HCL 1 % IJ SOLN
INTRAMUSCULAR | Status: AC
  Filled 2018-03-11: qty 60

## 2018-03-11 MED ORDER — SODIUM CHLORIDE 0.9 % IV SOLN
INTRAVENOUS | Status: AC
  Filled 2018-03-11: qty 2

## 2018-03-11 MED ORDER — SODIUM CHLORIDE 0.9 % IV SOLN: INTRAVENOUS | Status: AC

## 2018-03-11 MED ORDER — HEPARIN (PORCINE) IN NACL 1000-0.9 UT/500ML-% IV SOLN
INTRAVENOUS | Status: AC
  Filled 2018-03-11: qty 500

## 2018-03-11 MED ORDER — FENTANYL CITRATE (PF) 100 MCG/2ML IJ SOLN
INTRAMUSCULAR | Status: DC | PRN
  Administered 2018-03-11 (×2): 25 ug via INTRAVENOUS
  Administered 2018-03-11: 50 ug via INTRAVENOUS
  Administered 2018-03-11: 25 ug via INTRAVENOUS

## 2018-03-11 MED ORDER — CEFAZOLIN SODIUM-DEXTROSE 2-4 GM/100ML-% IV SOLN
INTRAVENOUS | Status: AC
  Filled 2018-03-11: qty 100

## 2018-03-11 MED ORDER — SODIUM CHLORIDE 0.9 % IV SOLN
80.0000 mg | INTRAVENOUS | Status: AC
  Administered 2018-03-11: 80 mg
  Filled 2018-03-11: qty 2

## 2018-03-11 MED ORDER — ONDANSETRON HCL 4 MG/2ML IJ SOLN: 4.0000 mg | Freq: Four times a day (QID) | INTRAMUSCULAR | Status: DC | PRN

## 2018-03-11 MED ORDER — FENTANYL CITRATE (PF) 100 MCG/2ML IJ SOLN
INTRAMUSCULAR | Status: AC
  Filled 2018-03-11: qty 2

## 2018-03-11 MED ORDER — CEFAZOLIN SODIUM-DEXTROSE 2-4 GM/100ML-% IV SOLN
2.0000 g | INTRAVENOUS | Status: AC
  Administered 2018-03-11: 2 g via INTRAVENOUS

## 2018-03-11 MED ORDER — LIDOCAINE HCL 1 % IJ SOLN
INTRAMUSCULAR | Status: AC
  Filled 2018-03-11: qty 40

## 2018-03-11 MED ORDER — MUPIROCIN 2 % EX OINT
TOPICAL_OINTMENT | CUTANEOUS | Status: AC
  Administered 2018-03-11: 06:00:00
  Filled 2018-03-11: qty 22

## 2018-03-11 MED ORDER — IOHEXOL 300 MG/ML  SOLN
75.0000 mL | Freq: Once | INTRAMUSCULAR | Status: AC | PRN
  Administered 2018-03-11: 75 mL via INTRAVENOUS

## 2018-03-11 MED ORDER — LIDOCAINE HCL (PF) 1 % IJ SOLN
INTRAMUSCULAR | Status: DC | PRN
  Administered 2018-03-11: 45 mL

## 2018-03-11 SURGICAL SUPPLY — 9 items
CABLE SURGICAL S-101-97-12 (CABLE) ×2 IMPLANT
HEMOSTAT SURGICEL 2X4 FIBR (HEMOSTASIS) ×1 IMPLANT
ICD CLARIA MRI DTMA1D4 (ICD Generator) ×1 IMPLANT
LEAD CAPSURE NOVUS 5076-52CM (Lead) ×1 IMPLANT
LEAD SPRINT QUAT SEC 6935M-62 (Lead) ×1 IMPLANT
PAD PRO RADIOLUCENT 2001M-C (PAD) ×2 IMPLANT
SHEATH CLASSIC 7F (SHEATH) ×1 IMPLANT
SHEATH CLASSIC 9F (SHEATH) ×1 IMPLANT
TRAY PACEMAKER INSERTION (PACKS) ×2 IMPLANT

## 2018-03-11 NOTE — Telephone Encounter (Signed)
Call came into triage - French Ana from [facility] to report CXR results. Results are in Epic.  Wanted to be sure they were seen. Forwarded at this time to Dr. Graciela Husbands and his primary nurse. Appears patient is currently admitted to hospital for device implantation.

## 2018-03-11 NOTE — Progress Notes (Signed)
Dr. Graciela Husbands requested Rx written for Prednisone 10mg  taper for the patient (gout) (6 tabs >,5 >4 > 3 >2 >1   Francis Dowse, PA-C

## 2018-03-11 NOTE — Telephone Encounter (Signed)
New Message:   French Ana from [facility] Radiologist.

## 2018-03-11 NOTE — Discharge Instructions (Signed)
° ° °  Supplemental Discharge Instructions for  °Pacemaker/Defibrillator Patients ° °Activity °No heavy lifting or vigorous activity with your left/right arm for 6 to 8 weeks.  Do not raise your left/right arm above your head for one week.  Gradually raise your affected arm as drawn below. ° °        °   03/15/2018                 03/16/2018               03/17/2018               03/18/2018 °__ ° °NO DRIVING for 1 week    ; you may begin driving on  03/18/2018   . ° °WOUND CARE °- Keep the wound area clean and dry.  Do not get this area wet for one week. No showers for one week; you may shower on 03/18/2018  . °- The tape/steri-strips on your wound will fall off; do not pull them off.  No bandage is needed on the site.  DO  NOT apply any creams, oils, or ointments to the wound area. °- If you notice any drainage or discharge from the wound, any swelling or bruising at the site, or you develop a fever > 101? F after you are discharged home, call the office at once. ° °Special Instructions °- You are still able to use cellular telephones; use the ear opposite the side where you have your pacemaker/defibrillator.  Avoid carrying your cellular phone near your device. °- When traveling through airports, show security personnel your identification card to avoid being screened in the metal detectors.  Ask the security personnel to use the hand wand. °- Avoid arc welding equipment, MRI testing (magnetic resonance imaging), TENS units (transcutaneous nerve stimulators).  Call the office for questions about other devices. °- Avoid electrical appliances that are in poor condition or are not properly grounded. °- Microwave ovens are safe to be near or to operate. ° °Additional information for defibrillator patients should your device go off: °- If your device goes off ONCE and you feel fine afterward, notify the device clinic nurses. °- If your device goes off ONCE and you do not feel well afterward, call 911. °- If your device goes  off TWICE, call 911. °- If your device goes off THREE times in one day, call 911. ° °DO NOT DRIVE YOURSELF OR A FAMILY MEMBER °WITH A DEFIBRILLATOR TO THE HOSPITAL--CALL 911. ° °

## 2018-03-11 NOTE — Progress Notes (Signed)
Patient given discharge instructions.  Instructed to use incentive spirometer and instructed on importance of deep breathing.

## 2018-03-11 NOTE — Telephone Encounter (Signed)
Pt is currently admitted into Short Stay. I called and spoke with his RN. She is aware of the CXR and has notified an APP to address.

## 2018-03-11 NOTE — Interval H&P Note (Signed)
ICD Criteria  Current LVEF:25%. Within 12 months prior to implant: Yes   Heart failure history: Yes, Class II  Cardiomyopathy history: Yes, Ischemic Cardiomyopathy - Prior MI.  Atrial Fibrillation/Atrial Flutter: No.  Ventricular tachycardia history: No.  Cardiac arrest history: No.  History of syndromes with risk of sudden death: No.  Previous ICD: No.  Current ICD indication: Primary  PPM indication: No.  Class I or II Bradycardia indication present: No  Beta Blocker therapy for 3 or more months: Yes, prescribed.   Ace Inhibitor/ARB therapy for 3 or more months: Yes, prescribed.    I have seen [name] is a 63 y.o. malepre-procedural and has been referred by PJ for consideration of ICD implant for primary  prevention of sudden death.  The patient's chart has been reviewed and they meet criteria for ICD implant.  I have had a thorough discussion with the patient reviewing options.  The patient and their family (if available) have had opportunities to ask questions and have them answered. The patient and I have decided together through the [facility] Heart Care Share Decision Support Tool to implant ICD at this time.  Risks, benefits, alternatives to ICD implantation were discussed in detail with the patient today. The patient  understands that the risks include but are not limited to bleeding, infection, pneumothorax, perforation, tamponade, vascular damage, renal failure, MI, stroke, death, inappropriate shocks, and lead dislodgement and   wishes to proceed.  History and Physical Interval Note:  03/11/2018 7:24 AM  [name]  has presented today for surgery, with the diagnosis of cm  The various methods of treatment have been discussed with the patient and family. After consideration of risks, benefits and other options for treatment, the patient has consented to  Procedure(s): ICD IMPLANT - Dual Chamber (N/A) as a surgical intervention .  The patient's history has been  reviewed, patient examined, no change in status, stable for surgery.  I have reviewed the patient's chart and labs.  Questions were answered to the patient's satisfaction.     Sherryl Manges

## 2018-03-14 NOTE — Telephone Encounter (Signed)
Addressed with chest CT

## 2018-03-15 ENCOUNTER — Other Ambulatory Visit (HOSPITAL_COMMUNITY): Payer: Self-pay | Admitting: Cardiology

## 2018-03-21 ENCOUNTER — Ambulatory Visit (INDEPENDENT_AMBULATORY_CARE_PROVIDER_SITE_OTHER): Payer: BLUE CROSS/BLUE SHIELD | Admitting: *Deleted

## 2018-03-21 ENCOUNTER — Other Ambulatory Visit: Payer: Self-pay

## 2018-03-21 DIAGNOSIS — I255 Ischemic cardiomyopathy: Secondary | ICD-10-CM | POA: Diagnosis not present

## 2018-03-21 DIAGNOSIS — Z95810 Presence of automatic (implantable) cardiac defibrillator: Secondary | ICD-10-CM

## 2018-03-21 DIAGNOSIS — I5022 Chronic systolic (congestive) heart failure: Secondary | ICD-10-CM

## 2018-03-21 DIAGNOSIS — M109 Gout, unspecified: Secondary | ICD-10-CM

## 2018-03-21 LAB — CUP PACEART INCLINIC DEVICE CHECK
Battery Remaining Longevity: 55 mo
Battery Voltage: 3.09 V
Brady Statistic AP VP Percent: 0.11 %
Brady Statistic AP VS Percent: 0.02 %
Brady Statistic AS VP Percent: 99.53 %
Brady Statistic AS VS Percent: 0.33 %
Brady Statistic RA Percent Paced: 0.13 %
Brady Statistic RV Percent Paced: 99.04 %
Date Time Interrogation Session: 20200316141034
HighPow Impedance: 68 Ohm
Implantable Lead Implant Date: 20190610
Implantable Lead Implant Date: 20200306
Implantable Lead Implant Date: 20200306
Implantable Lead Location: 753858
Implantable Lead Location: 753859
Implantable Lead Location: 753860
Implantable Lead Model: 5071
Implantable Lead Model: 5076
Implantable Pulse Generator Implant Date: 20200306
Lead Channel Impedance Value: 323 Ohm
Lead Channel Impedance Value: 399 Ohm
Lead Channel Impedance Value: 4047 Ohm
Lead Channel Impedance Value: 4047 Ohm
Lead Channel Impedance Value: 494 Ohm
Lead Channel Impedance Value: 513 Ohm
Lead Channel Pacing Threshold Amplitude: 0.75 V
Lead Channel Pacing Threshold Amplitude: 0.75 V
Lead Channel Pacing Threshold Amplitude: 1.25 V
Lead Channel Pacing Threshold Pulse Width: 0.4 ms
Lead Channel Pacing Threshold Pulse Width: 0.4 ms
Lead Channel Pacing Threshold Pulse Width: 1 ms
Lead Channel Sensing Intrinsic Amplitude: 28.5 mV
Lead Channel Sensing Intrinsic Amplitude: 4 mV
Lead Channel Setting Pacing Amplitude: 3 V
Lead Channel Setting Pacing Amplitude: 3.5 V
Lead Channel Setting Pacing Amplitude: 3.5 V
Lead Channel Setting Pacing Pulse Width: 0.4 ms
Lead Channel Setting Pacing Pulse Width: 1 ms
Lead Channel Setting Sensing Sensitivity: 0.3 mV

## 2018-03-21 MED ORDER — PREDNISONE 10 MG PO TABS: ORAL_TABLET | ORAL | 0 refills | Status: DC

## 2018-03-21 NOTE — Patient Instructions (Signed)
Clarify whether you are still taking spironolactone (aldactone). If you are, please call our office at [phone] to update your medicine list.

## 2018-03-21 NOTE — Progress Notes (Signed)
Device Clinic wound check appointment. Dermabond removed. Wound without redness or edema. Incision edges approximated, wound well healed. Normal device function. Thresholds, sensing, and impedances consistent with implant measurements. RA and RV outputs programmed at 3.5V for extra safety margin until 3 month visit; LV output reprogrammed to 3.0V @ 1.23ms on monitor only. Histogram distribution appropriate for patient and level of activity. BiV pacing 99.0% (1.1% effective--suspect this is inaccurate due to unipolar LV lead, but will clarify with [employer]). No mode switches or ventricular arrhythmias noted. SK saw patient as he reports he has less energy over recent days, EKG performed, V-V pace delay reprogrammed to LV first by 17ms per SK, patient aware to call if he feels worse with this change. Max RA/RV/LV pacing impedances increased to 2000ohms per protocol. Patient educated about wound care, arm mobility, lifting restrictions, shock plan, and Carelink monitor. ROV with SK on 06/24/18.  Dr. Graciela Husbands prescribed an additional 5-day prednisone taper for patient's gout (right knee and right big toe) as patient reports the wait at his PCP office is ~4 weeks. Patient instructed to f/u at PCP office or urgent care if symptoms are not resolved after completing the taper.

## 2018-04-07 ENCOUNTER — Telehealth: Payer: Self-pay | Admitting: Internal Medicine

## 2018-04-07 NOTE — Telephone Encounter (Signed)
Spoke with pt today who has c/o generalized fatigue which he states has gotten worse since he was implanted with his BiV ICD in the beginning of March.   I advised him to send in a remote check so that our device clinic can determine if his device is working appropriately. Pt agreed.  I will forward to Brunilda Payor, RN for further review when remote check is sent in.  Pt understands we will contact him regarding this after evaluated.

## 2018-04-07 NOTE — Telephone Encounter (Signed)
Transmission reviewed. Normal device function. Presenting rhythm shows As/BiVp at 92bpm. Lead trends stable. BiV pacing 99.3% (effective CRT % unavailable). Histograms appropriate. No atrial or ventricular arrhythmia episodes.   Routed to [employer], Charity fundraiser.

## 2018-04-07 NOTE — Telephone Encounter (Signed)
New Message:    Patient calling to report his enegry level is not the same. Patient has a Production designer, theatre/television/film. Please call patient.

## 2018-04-11 NOTE — Telephone Encounter (Signed)
Discussed in more detail with [employer] rep Leta Jungling).  Most likely reasons for ineffective CRT: delayed LV depolarization (latency) or loss of LV capture. Possible reprogramming options: try [employer]ivCRT (confirmed should still work with unipolar LV lead), perform EKG opt with LV pre-excitation beginning at 29ms.   Notably, presenting EGM from initial post-implant transmission on 03/11/18 shows initial negative deflection on EGM3:   Transmission from 03/12/18 with LV PW increased to 0.62ms:   Transmission from 4/5 with LV first by 38ms and LV PW increased to 1.53ms:

## 2018-04-18 NOTE — Telephone Encounter (Signed)
E   Lets try the aCRT programming first empirically   thnaks   SK

## 2018-04-19 NOTE — Telephone Encounter (Signed)
Spoke with patient. He is agreeable to appointment on 04/25/18 at 1:00pm in Device Clinic to turn AdaptivCRT on. He reports he has sent some manual transmissions in because he notices a tingling, electricity-like sensation at his device site, particularly at night. Advised that his device is functioning normally as programmed. Requested that he call us prior to sending in a manual transmission if he is concerned about symptoms going forward. Pt verbalizes understanding and denies questions at this time.

## 2018-04-22 ENCOUNTER — Other Ambulatory Visit: Payer: Self-pay

## 2018-04-22 MED ORDER — EVOLOCUMAB 140 MG/ML ~~LOC~~ SOAJ: 140.0000 mg | SUBCUTANEOUS | 1 refills | Status: DC

## 2018-04-25 ENCOUNTER — Other Ambulatory Visit: Payer: Self-pay

## 2018-04-25 ENCOUNTER — Ambulatory Visit (INDEPENDENT_AMBULATORY_CARE_PROVIDER_SITE_OTHER): Payer: BLUE CROSS/BLUE SHIELD | Admitting: Student

## 2018-04-25 DIAGNOSIS — I255 Ischemic cardiomyopathy: Secondary | ICD-10-CM | POA: Diagnosis not present

## 2018-04-25 DIAGNOSIS — I447 Left bundle-branch block, unspecified: Secondary | ICD-10-CM | POA: Diagnosis not present

## 2018-04-25 LAB — CUP PACEART INCLINIC DEVICE CHECK
Battery Remaining Longevity: 53 mo
Battery Voltage: 3.04 V
Brady Statistic AP VP Percent: 0.06 %
Brady Statistic AP VS Percent: 0.01 %
Brady Statistic AS VP Percent: 99.69 %
Brady Statistic AS VS Percent: 0.24 %
Brady Statistic RA Percent Paced: 0.07 %
Brady Statistic RV Percent Paced: 98.86 %
Date Time Interrogation Session: 20200420143844
HighPow Impedance: 78 Ohm
Implantable Lead Implant Date: 20190610
Implantable Lead Implant Date: 20200306
Implantable Lead Implant Date: 20200306
Implantable Lead Location: 753858
Implantable Lead Location: 753859
Implantable Lead Location: 753860
Implantable Lead Model: 5071
Implantable Lead Model: 5076
Implantable Pulse Generator Implant Date: 20200306
Lead Channel Impedance Value: 323 Ohm
Lead Channel Impedance Value: 399 Ohm
Lead Channel Impedance Value: 4047 Ohm
Lead Channel Impedance Value: 4047 Ohm
Lead Channel Impedance Value: 437 Ohm
Lead Channel Impedance Value: 494 Ohm
Lead Channel Pacing Threshold Amplitude: 0.5 V
Lead Channel Pacing Threshold Amplitude: 0.875 V
Lead Channel Pacing Threshold Amplitude: 1.375 V
Lead Channel Pacing Threshold Pulse Width: 0.4 ms
Lead Channel Pacing Threshold Pulse Width: 0.4 ms
Lead Channel Pacing Threshold Pulse Width: 1 ms
Lead Channel Sensing Intrinsic Amplitude: 27.25 mV
Lead Channel Sensing Intrinsic Amplitude: 27.25 mV
Lead Channel Sensing Intrinsic Amplitude: 3.375 mV
Lead Channel Sensing Intrinsic Amplitude: 3.375 mV
Lead Channel Setting Pacing Amplitude: 3 V
Lead Channel Setting Pacing Amplitude: 3.5 V
Lead Channel Setting Pacing Amplitude: 3.5 V
Lead Channel Setting Pacing Pulse Width: 0.4 ms
Lead Channel Setting Pacing Pulse Width: 1 ms
Lead Channel Setting Sensing Sensitivity: 0.3 mV

## 2018-04-25 NOTE — Progress Notes (Signed)
CRT-D device check in office. RA and RV leads not tested. LV Threshold and impedence consistent with previous device measurements. No mode switch episodes recorded. No ventricular arrhythmia episodes recorded. Patient bi-ventricularly pacing 99.75% of the time. % Effective CRT pacing not available likely due to high polarization lead per industry. Device programmed with appropriate safety margins.   Initially changed programming from non-adaptive CRT to Adaptive BV and LV pacing. Reviewed EGM and EKG with MDT rep and Dr Graciela Husbands. Per Dr Graciela Husbands, programmed back to Non-adaptive CRT with LV -> RV by 80 ms. Improvement noted in EKG and EGM.  Heart failure diagnostics reviewed and trends are stable for patient. Estimated longevity 57yr, 68mo.  Patient enrolled in remote follow up. Plan to check device physically in 2 months.   Casimiro Needle [address] [place], PA-C 04/25/2018 2:12 PM

## 2018-05-02 NOTE — Addendum Note (Signed)
Addended by: Nigel Sloop on: 05/02/2018 06:17 PM   Modules accepted: Orders

## 2018-05-03 ENCOUNTER — Other Ambulatory Visit: Payer: Self-pay | Admitting: Cardiothoracic Surgery

## 2018-05-05 ENCOUNTER — Telehealth: Payer: Self-pay | Admitting: Cardiology

## 2018-05-05 NOTE — Telephone Encounter (Signed)
New Message    Pt is calling because he said he has seen Dr Graciela Husbands and is still experiencing Fatigue  Everyday it is getting worse  He wants to know if it has something to do with the medication   Please call

## 2018-05-05 NOTE — Telephone Encounter (Signed)
Per pt is calling due to extreme fatigue pt feels is getting worse day by day and has to force self to do anything and that is even becoming difficult. Pt has not checked B/P or heart rate and does not have machine to do so no other complaints. Will forward to Dr Swaziland for review and recommendations ./cy

## 2018-05-06 NOTE — Telephone Encounter (Signed)
Difficult to assess. States symptoms started with device implant but device function checked in office was OK. So rhythm is ok. BP is unknown and I don't see any recordings of this since February. I don't think his medication is causing this unless BP is too low. If he is unable to check BP at home he may require an office visit to assess and check labs.   Peter Swaziland MD, [facility]

## 2018-05-06 NOTE — Telephone Encounter (Signed)
Spoke to patient Dr.Jordan's advice given.Stated he would like a office visit with Dr.Jordan.Stated he does not have a B/P cuff, but when he has had B/P checked it has been good.Advised I will have scheduler to call back and schedule office visit when Dr.Jordan is DOD.

## 2018-05-09 NOTE — Telephone Encounter (Signed)
Appointment scheduled with Dr.Jordan on his DOD day 05/25/18 at 2:00 pm.

## 2018-05-22 NOTE — Progress Notes (Signed)
[name] Date of Birth: [birth year]   History of Present Illness: [name] is seen for evaluation of fatigue. He has a history of coronary disease and is status post stenting of the right coronary in 2009. He underwent cardiac catheterization in October 2012 which showed continued patency of the stents and nonobstructive coronary disease. Myoview at that time showed an inferior infarct with some peri-infarct ischemia.   He was admitted from 6/3 - 06/21/17. He presented with flash pulmonary edema. Ruled in for NSTEMI with peak trop I of 0.28. The underwent cardiac cath  which showed severe 3v CAD. Echo with EF 20-25%.  He was referred for CABG and this was performed on 4 06/14/17 by Dr. Donata Clay. He had to have a suprapubic catheter placed due to urethral stricture.   Admitted 6/18 - 06/25/17 after syncope in urology office. Noted to have wide complex tachycardia on telemetry. This was at a rate of 170 bpm and lasted about 30 beats. Echo showed EF 35-40%. LE venous doppler negative for DVT. CT negative for PE. Seen by EP. Beta blocker dose increased. Fitted with Lifevest. Unclear if arrhythmia was VT versus NSVT.   Meds cut back on day of discharge due to mild hypotension and orthostasis.  Repeat echo in 9/19 showed persistently low EF, 25-30%.  He has had a difficult time taking cardiac medications due to orthostatic symptoms/lightheadedness.  He did not tolerate Entresto and has been unable to increase Coreg from 6.25 mg bid. On 10/16/17 he underwent urethroplasty with buccal mucosa onlay graft for his urethral stricure. Suprapubic catheter removed. Other problems include HTN, HLD, and CKD stage 2.  He was tried on aldactone but was unable to tolerate this due to hypotension. Seen by Dr. Graciela Husbands for consideration of CRT-D. He wanted to try Entresto again and reduced his Coreg dose to 3.125 mg bid. On Entresto 24-26 mg 1/2 tablet bid. Any higher dose resulted in hypotension and dizziness. Repeat Echo  showed no improvement in EF 25%. Also seen by Dr. Donata Clay for neuropathic sternal pain. Did not respond to gabapentin. Started Lyrica.   On 03/11/18 he did undergo dual chamber ICD/BiV pacemaker placement by Dr. Graciela Husbands. Seen back in device clinic on April 20 and device parameters programmed for optimization. Despite these measures he has continued to have marked fatigue.   He really dates the onset of his fatigue to when he had his device implanted. By early afternoon he is exhausted. His weight has steadily crept up over the past few months without increase in edema, Dyspnea, orthopnea or PND. He still does have DOE but this has not changed. No angina. He tries to ride stationary bike 30 minutes/day and does some light weight lifting. His joints ache and this comes and goes. He has bad insomnia- did well with ambien before but ran out 5 weeks ago. He doesn't think Lyrica has helped with his sternal pain.      Current Outpatient Medications on File Prior to Visit  Medication Sig Dispense Refill  . acetaminophen (TYLENOL) 500 MG tablet Take 1,000 mg by mouth 2 (two) times daily.    . Ascorbic Acid (VITAMIN C) 1000 MG tablet Take 1,000 mg by mouth daily.    Marland Kitchen aspirin 81 MG chewable tablet Chew 81 mg by mouth daily.    . carvedilol (COREG) 6.25 MG tablet Take 3.125 mg by mouth 2 (two) times daily with a meal. 1/2 tablet BID    . Evolocumab (REPATHA SURECLICK) 140  MG/ML SOAJ Inject 140 mg into the skin every 14 (fourteen) days. 2 pen 1  . Multiple Vitamins-Minerals (MENS MULTIVITAMIN PLUS PO) Take 1 tablet by mouth 2 (two) times daily.    Marland Kitchen. OVER THE COUNTER MEDICATION Take 2 capsules by mouth at bedtime. Deep Sleep    . predniSONE (DELTASONE) 10 MG tablet Daily taper: 5 tablets day 1, 4 tablets day 2, 3 tablets day 3, 2 tablets day 4, 1 tablet day 5 15 tablet 0  . Probiotic Product (PROBIOTIC PO) Take 1 capsule by mouth 2 (two) times daily.    . sacubitril-valsartan (ENTRESTO) 24-26 MG Take 0.5  tablets by mouth 2 (two) times daily.    Marland Kitchen. zolpidem (AMBIEN) 10 MG tablet Take 10 mg by mouth at bedtime.     No current facility-administered medications on file prior to visit.     Allergies  Allergen Reactions  . Ezetimibe Other (See Comments)    Joint pain and drops BP  . Niacin Other (See Comments)    Drops BP and severe  . Statins Other (See Comments)    Severe joint pain and drops BP.    Past Medical History:  Diagnosis Date  . Arthritis    "mild; back, neck, spine" (06/09/2017)  . CHF (congestive heart failure) (HCC)   . Coronary artery disease   . History of gout   . History of kidney stones X 2  . Hypercholesterolemia   . Hypertension   . LBBB (left bundle branch block)   . MI (myocardial infarction) (HCC) 1993   LATERAL  . MI (myocardial infarction) (HCC) ?09/2001; 06/07/2017  . Pneumonia    "several times" (06/09/2017)  . S/P coronary artery stent placement    RCA  . Varicose veins     Past Surgical History:  Procedure Laterality Date  . ANTERIOR CERVICAL DECOMP/DISCECTOMY FUSION  03/03/2011   Procedure: ANTERIOR CERVICAL DECOMPRESSION/DISCECTOMY FUSION 3 LEVELS;  Surgeon: Karn CassisErnesto M Botero, MD;  Location: [facility];  Service: Neurosurgery;  Laterality: N/A;  Cervical three-four Cervical four-five Cervical five-six Anterior cervical decompression/diskectomy, fusion  . BACK SURGERY    . CARDIAC CATHETERIZATION  2009   Stents in RCA patent. 70 to 80% PL, and 50% ostial PD.   Marland Kitchen. CARDIAC CATHETERIZATION N/A 11/20/2014   Procedure: Left Heart Cath and Coronary Angiography;  Surgeon: Analys Ryden M SwazilandJordan, MD;  [facility];  Service: Cardiovascular;  Laterality: N/A;  . CORONARY ANGIOPLASTY     DIRECT ANGIOPLASTY THE MARGINAL BRANCH  . CORONARY ANGIOPLASTY WITH STENT PLACEMENT     RCA  . CORONARY ARTERY BYPASS GRAFT N/A 06/14/2017   Procedure: CORONARY ARTERY BYPASS GRAFTING (CABG) x four, using left internal mammary artery and right leg greater saphenous  vein harvested endoscopically;  Surgeon: Kerin PernaVan Trigt, Jazmina Muhlenkamp, MD;  [facility];  Service: Open Heart Surgery;  Laterality: N/A;  . CYSTOSCOPY N/A 06/14/2017   Procedure: CYSTOSCOPY;  Surgeon: Donata ClayVan Trigt, Theron AristaPeter, MD;  [facility];  Service: Open Heart Surgery;  Laterality: N/A;  . ICD IMPLANT N/A 03/11/2018   Procedure: ICD IMPLANT - Dual Chamber;  Surgeon: Duke SalviaKlein, Steven C, MD;  [facility];  Service: Cardiovascular;  Laterality: N/A;  . INSERTION OF SUPRAPUBIC CATHETER N/A 06/14/2017   Procedure: INSERTION OF SUPRAPUBIC CATHETER - Lower abdomen;  Surgeon: Kerin PernaVan Trigt, Natalya Domzalski, MD;  [facility];  Service: Open Heart Surgery;  Laterality: N/A;  . POSTERIOR LUMBAR FUSION  10/2011  . RIGHT/LEFT HEART CATH AND  CORONARY ANGIOGRAPHY N/A 06/08/2017   Procedure: RIGHT/LEFT HEART CATH AND CORONARY ANGIOGRAPHY;  Surgeon: Swaziland, Laurali Goddard M, MD;  Location: [facility] INVASIVE CV LAB;  Service: Cardiovascular;  Laterality: N/A;  . TEE WITHOUT CARDIOVERSION N/A 06/14/2017   Procedure: TRANSESOPHAGEAL ECHOCARDIOGRAM (TEE);  Surgeon: Donata Clay, Theron Arista, MD;  Location: [facility] OR;  Service: Open Heart Surgery;  Laterality: N/A;  . URETHROPLASTY N/A 10/15/2017   Procedure: URETHROPLASTY WITH BUCCAL GRAFT HARVAST;  Surgeon: Crist Fat, MD;  Location: [facility] ORS;  Service: Urology;  Laterality: N/A;    Social History   Tobacco Use  Smoking Status Former Smoker  . Packs/day: 2.00  . Years: 20.00  . Pack years: 40.00  . Types: Cigarettes  . Last attempt to quit: 05/07/1999  . Years since quitting: 19.0  Smokeless Tobacco Never Used    Social History   Substance and Sexual Activity  Alcohol Use Yes   Comment: 06/09/2017 "1 beer/month; at most"    Family History  Problem Relation Age of Onset  . Heart attack Father   . Diabetes Father   . Breast cancer Mother   . Coronary artery disease Mother   . Heart disease Brother   . Heart attack Maternal Grandfather     Review of Systems: The review of  systems is per the HPI.  All other systems were reviewed and are negative.  Physical Exam: BP 132/68   Pulse 82   Ht  (1.88 m)   Wt 231 lb 3.2 oz (104.9 kg)   BMI 29.68 kg/m  GENERAL:  Well appearing overweight WM in NAD HEENT:  PERRL, EOMI, sclera are clear. Oropharynx is clear. NECK:  No jugular venous distention, carotid upstroke brisk and symmetric, no bruits, no thyromegaly or adenopathy LUNGS:  Clear to auscultation bilaterally CHEST:  Unremarkable. ICD site has healed well. HEART:  RRR,  PMI not displaced or sustained,S1 and S2 within normal limits, no S3, no S4: no clicks, no rubs, no murmurs ABD:  Soft, nontender. BS +, no masses or bruits. No hepatomegaly, no splenomegaly EXT:  2 + pulses throughout, no edema, no cyanosis no clubbing SKIN:  Warm and dry.  No rashes NEURO:  Alert and oriented x 3. Cranial nerves II through XII intact. PSYCH:  Cognitively intact      LABORATORY DATA:  Lab Results  Component Value Date   WBC 6.8 03/04/2018   HGB 13.9 03/04/2018   HCT 43.0 03/04/2018   PLT 314 03/04/2018   GLUCOSE 92 03/04/2018   CHOL 109 10/29/2017   TRIG 135 10/29/2017   HDL 42 10/29/2017   LDLCALC 40 10/29/2017   ALT 19 10/11/2017   AST 24 10/11/2017   NA 138 03/04/2018   K 4.7 03/04/2018   CL 102 03/04/2018   CREATININE 1.06 03/04/2018   BUN 24 03/04/2018   CO2 23 03/04/2018   TSH 3.983 06/10/2017   INR 1.28 06/14/2017   HGBA1C 5.9 (H) 06/10/2017    Ecg today shows  NSR with BiV pacing. Rate 83. I have personally reviewed and interpreted this study.  Cardiac cath 06/08/17:  RIGHT/LEFT HEART CATH AND CORONARY ANGIOGRAPHY  Conclusion     Prox RCA lesion is 70% stenosed.  Mid RCA lesion is 50% stenosed.  Previously placed Mid RCA to Dist RCA stent (unknown type) is widely patent.  Previously placed Dist RCA stent (unknown type) is widely patent.  Post Atrio lesion is 50% stenosed.  Ost LAD to Prox LAD lesion is 99% stenosed.  Suezanne Jacquet  1st  Diag to 1st Diag lesion is 95% stenosed.  Ost Ramus to Ramus lesion is 80% stenosed.  Ost 1st Mrg lesion is 75% stenosed.  There is severe left ventricular systolic dysfunction.  LV end diastolic pressure is normal.  The left ventricular ejection fraction is less than 25% by visual estimate.  LV end diastolic pressure is normal.   1. Severe 3 vessel obstructive CAD    -99% ostial LAD with TIMI 1 flow. The vessel fills antegrade to the second diagonal. There are good right to left collaterals to the mid to distal LAD. The vessel appears large.    - 95% proximal first diagonal    - 80% proximal ramus intermediate    - 75% large OM1    - 70% proximal RCA 2. Severe LV dysfunction. EF estimated at 20%. 3. Low LVEDP and PCWP c/w effective diuresis 4. Normal right heart pressures 5. Normal cardiac output. Index 3.28.   Plan: Recommend CT surgery consult for revascularization- message sent. Echo pending. The ostial LAD is not suitable for PCI. Optimize medical therapy for CHF. Will discontinue amlodipine. Start Coreg. Consider Entresto and aldactone. If pacemaker/ICD needed in the future would recommend using a left subclavian approach.     Echo 09/14/17: Study Conclusions  - Left ventricle: The cavity size was severely dilated. Wall   thickness was normal. Systolic function was severely reduced. The   estimated ejection fraction was in the range of 25% to 30%.   Diffuse hypokinesis. Doppler parameters are consistent with   restrictive physiology, indicative of decreased left ventricular   diastolic compliance and/or increased left atrial pressure. - Aortic valve: There was mild regurgitation. - Aorta: Ascending aortic diameter: 40 mm (S). - Left atrium: The atrium was mildly dilated.  Impressions:  - Severe global reduction in LV systolic function; severe LVE;   restrictive filling; mildly dilated ascending aorta; mild AI;   mild LAE.  Echo 01/21/18: Study Conclusions  -  Procedure narrative: Transthoracic echocardiography. Image   quality was adequate. The study was technically difficult. - Left ventricle: The cavity size was severely dilated. Wall   thickness was increased in a pattern of mild LVH. The estimated   ejection fraction was 25%. Diffuse hypokinesis. The study is not   technically sufficient to allow evaluation of LV diastolic   function. - Aortic valve: There was mild regurgitation. - Mitral valve: Calcified annulus. Mildly thickened leaflets . - Left atrium: The atrium was mildly dilated. - Atrial septum: No defect or patent foramen ovale was identified.   Assessment / Plan: 1. Coronary disease status post stenting of the right coronary 2009. Presentation in June 2019 with acute pulmonary edema and NSTEMI. Cath showed severe 3 vessel CAD. Now  S/p CABG. No recurrent angina.  2. Chronic systolic CHF. EF persistently low on optimal medical therapy and post revascularization. EF 25-30%. NYHA class 1-2. Difficulty with titration of medication due to low BP before. Now on low doses of Entresto and Coreg. LBBB.Marland Kitchen He is s/p CRT-D therapy. BP has improved potentially giving [place] the opportunity to increase his CHF therapy but I am reluctant to change today until we get a better handle on his fatigue. His BiV pacing has improved his QRS duration. We will repeat Echo in 6 weeks and see him for follow up after.   3. History of Syncope with WCT. Now with ICD in place  4. Hypercholesterolemia. Patient is intolerant to multiple medications. He is on Repatha now.   LDL  40 is at goal. Triglycerides are normal- will stop fenofibrate.  5. LBBB chronic  6. CKD stage 2  7. Sternal pain, neuropathic. No change  8. Fatigue- difficult to sort out. He relates to when he had his device placed but this appears to be working well. Will stop Lyrica and Fenofibrate to see if this helps. Will renew ambien to help with his insomnia. He reports he has been tested for sleep  apnea twice before and results were inconclusive. Will follow up after Echo in 6 weeks. If symptoms persist consider checking thyroid status and possible Rheumatologic serology.

## 2018-05-25 ENCOUNTER — Other Ambulatory Visit: Payer: Self-pay

## 2018-05-25 ENCOUNTER — Encounter: Payer: Self-pay | Admitting: Cardiology

## 2018-05-25 ENCOUNTER — Ambulatory Visit (INDEPENDENT_AMBULATORY_CARE_PROVIDER_SITE_OTHER): Payer: Managed Care, Other (non HMO) | Admitting: Cardiology

## 2018-05-25 VITALS — BP 132/68 | HR 82 | Ht 74.0 in | Wt 231.2 lb

## 2018-05-25 DIAGNOSIS — I2510 Atherosclerotic heart disease of native coronary artery without angina pectoris: Secondary | ICD-10-CM

## 2018-05-25 DIAGNOSIS — E7800 Pure hypercholesterolemia, unspecified: Secondary | ICD-10-CM

## 2018-05-25 DIAGNOSIS — R5383 Other fatigue: Secondary | ICD-10-CM | POA: Diagnosis not present

## 2018-05-25 DIAGNOSIS — I5022 Chronic systolic (congestive) heart failure: Secondary | ICD-10-CM | POA: Diagnosis not present

## 2018-05-25 DIAGNOSIS — Z951 Presence of aortocoronary bypass graft: Secondary | ICD-10-CM

## 2018-05-25 DIAGNOSIS — I447 Left bundle-branch block, unspecified: Secondary | ICD-10-CM

## 2018-05-25 DIAGNOSIS — I2583 Coronary atherosclerosis due to lipid rich plaque: Secondary | ICD-10-CM

## 2018-05-25 MED ORDER — ZOLPIDEM TARTRATE 10 MG PO TABS: 10.0000 mg | ORAL_TABLET | Freq: Every day | ORAL | 3 refills | Status: DC

## 2018-05-25 NOTE — Patient Instructions (Signed)
Stop taking Lyrica and fenofibrate  We will refill Ambien  We will arrange a follow up Echo in about 6 weeks and I will see you after for a visit

## 2018-05-26 ENCOUNTER — Other Ambulatory Visit (INDEPENDENT_AMBULATORY_CARE_PROVIDER_SITE_OTHER): Payer: BLUE CROSS/BLUE SHIELD

## 2018-05-26 DIAGNOSIS — I5022 Chronic systolic (congestive) heart failure: Secondary | ICD-10-CM

## 2018-05-26 DIAGNOSIS — I10 Essential (primary) hypertension: Secondary | ICD-10-CM | POA: Diagnosis not present

## 2018-06-10 ENCOUNTER — Telehealth: Payer: Self-pay

## 2018-06-10 NOTE — Telephone Encounter (Signed)
I called patient about his appointment and he wanted to get a message to Dr. Graciela Husbands about cancelling travel plans and needing a letter. Patient is still not feeling well after his pacemaker implant. He has an echo and follow up with Dr. Swaziland in July. He wants to cancel his trip because he does not feel comfortable traveling and would like a letter for the airline company.

## 2018-06-13 ENCOUNTER — Telehealth: Payer: Self-pay | Admitting: Student

## 2018-06-13 ENCOUNTER — Other Ambulatory Visit: Payer: Self-pay

## 2018-06-13 NOTE — Telephone Encounter (Signed)
Discussed with patient.  States it feels like a "nerve pain" and he is sensitive above the device, including when wearing a seatbelt. He confirms multiple times that there is no redness, swelling, drainage, or bleeding. He denies fever or chills.   Instructed patient that it is MOST likely nerve pain, but to continue to monitor for signs of infection. He will alert [place] of any changes.    Legrand Como [address] [place], PA-C 06/13/2018 4:42 PM

## 2018-06-13 NOTE — Telephone Encounter (Signed)
-----   Message from Dollene Primrose, RN sent at 06/13/2018  1:41 PM EDT ----- Regarding: Pain at device site Hi Device Team  I think this pt needs to be called an assessed. He has c/o tenderness and pain at the device site that extends into his upper left chest area. I thought he might need a look.  Thanks!  Lor[place]

## 2018-06-13 NOTE — Telephone Encounter (Signed)
Called and spoke with pt regarding his request. He states he does not feel well and does not think he should go on vacation this year. He states he is still having pain at the device site that extends to left upper/mid chest area.  I advised him I would message the device team regarding his device site. In regards to his letter, Dr Caryl Comes is not in the office this week but could review his request next week. Pt understands he can call Dr Doug Sou office as well if the letter is needed sooner than later.  He verbalized understanding and had no additional questions.

## 2018-06-13 NOTE — Telephone Encounter (Signed)
Received message requesting we call patient about tenderness around his device.  LMOM to call back DC.

## 2018-06-14 MED ORDER — EVOLOCUMAB 140 MG/ML ~~LOC~~ SOAJ: 140.0000 mg | SUBCUTANEOUS | 3 refills | Status: DC

## 2018-06-16 NOTE — Telephone Encounter (Signed)
The AV delay looks really short--I wonder if we should do an AV opt echo--but maybe first we can bring him into device clinic and hook him up to an ECG 1) set LV-RV offset to 1/2 of current, and 0 msec 2) Change AV delay from 130>>160 with the 3 different LV-RV offsets   Thanks

## 2018-06-16 NOTE — Telephone Encounter (Signed)
At [facility] on 03/21/18, device was reprogrammed from non-adaptive CRT with simultaneous LV-RV to LV first by 7ms. Pt called in 4/2--reported no improvement in symptoms. Device was then reprogrammed on 4/20 to LV first by 68ms (based on EKG). AdaptivCRT was also attempted at that time, but EKG was reviewed and QRS was wider, less positive in V1. Strips reviewed by Dr. Caryl Comes during visits.  Pt is scheduled for f/u with Chanetta Marshall, NP, on 06/20/18.

## 2018-06-17 NOTE — Telephone Encounter (Signed)
A  If yu can't do much with ECG optimization maybe we can try then with echo Thoughts?  And thx  SK

## 2018-06-20 ENCOUNTER — Encounter: Payer: BLUE CROSS/BLUE SHIELD | Admitting: Nurse Practitioner

## 2018-06-24 ENCOUNTER — Encounter: Payer: BLUE CROSS/BLUE SHIELD | Admitting: Internal Medicine

## 2018-07-04 ENCOUNTER — Encounter: Payer: BLUE CROSS/BLUE SHIELD | Admitting: Nurse Practitioner

## 2018-07-06 ENCOUNTER — Other Ambulatory Visit: Payer: Self-pay | Admitting: Cardiology

## 2018-07-11 ENCOUNTER — Other Ambulatory Visit: Payer: Self-pay

## 2018-07-11 ENCOUNTER — Ambulatory Visit (HOSPITAL_COMMUNITY): Payer: BC Managed Care – PPO | Attending: Internal Medicine

## 2018-07-11 DIAGNOSIS — E7800 Pure hypercholesterolemia, unspecified: Secondary | ICD-10-CM

## 2018-07-11 DIAGNOSIS — Z951 Presence of aortocoronary bypass graft: Secondary | ICD-10-CM

## 2018-07-11 DIAGNOSIS — I2583 Coronary atherosclerosis due to lipid rich plaque: Secondary | ICD-10-CM | POA: Diagnosis not present

## 2018-07-11 DIAGNOSIS — I5022 Chronic systolic (congestive) heart failure: Secondary | ICD-10-CM

## 2018-07-11 DIAGNOSIS — R5383 Other fatigue: Secondary | ICD-10-CM | POA: Diagnosis not present

## 2018-07-11 DIAGNOSIS — I447 Left bundle-branch block, unspecified: Secondary | ICD-10-CM | POA: Diagnosis not present

## 2018-07-11 DIAGNOSIS — I2510 Atherosclerotic heart disease of native coronary artery without angina pectoris: Secondary | ICD-10-CM

## 2018-07-15 ENCOUNTER — Telehealth: Payer: Self-pay | Admitting: Cardiology

## 2018-07-15 NOTE — Telephone Encounter (Signed)
Called patient, gave echo results. Patient verbalized understanding.

## 2018-07-15 NOTE — Telephone Encounter (Signed)
New Message ° ° ° °Patient is returning call in reference to echocardiogram results. Please call.  °

## 2018-07-15 NOTE — Progress Notes (Signed)
[name] Date of Birth: December 11, 1955   History of Present Illness: [name] is seen for evaluation of fatigue. He has a history of coronary disease and is status post stenting of the right coronary in 2009. He underwent cardiac catheterization in October 2012 which showed continued patency of the stents and nonobstructive coronary disease. Myoview at that time showed an inferior infarct with some peri-infarct ischemia.   He was admitted from 6/3 - 06/21/17. He presented with flash pulmonary edema. Ruled in for NSTEMI with peak trop I of 0.28. The underwent cardiac cath  which showed severe 3v CAD. Echo with EF 20-25%.  He was referred for CABG and this was performed on 4 06/14/17 by Dr. Prescott Gum. He had to have a suprapubic catheter placed due to urethral stricture.   Admitted 6/18 - 06/25/17 after syncope in urology office. Noted to have wide complex tachycardia on telemetry. This was at a rate of 170 bpm and lasted about 30 beats. Echo showed EF 35-40%. LE venous doppler negative for DVT. CT negative for PE. Seen by EP. Beta blocker dose increased. Fitted with Lifevest. Unclear if arrhythmia was VT versus NSVT.   Meds cut back on day of discharge due to mild hypotension and orthostasis.  Repeat echo in 9/19 showed persistently low EF, 25-30%.  He has had a difficult time taking cardiac medications due to orthostatic symptoms/lightheadedness.  He did not tolerate Entresto and has been unable to increase Coreg from 6.25 mg bid. On 10/16/17 he underwent urethroplasty with buccal mucosa onlay graft for his urethral stricure. Suprapubic catheter removed. Other problems include HTN, HLD, and CKD stage 2.  He was tried on aldactone but was unable to tolerate this due to hypotension. Seen by Dr. Caryl Comes for consideration of CRT-D. He wanted to try Entresto again and reduced his Coreg dose to 3.125 mg bid. On Entresto 24-26 mg 1/2 tablet bid. Any higher dose resulted in hypotension and dizziness. Repeat Echo  showed no improvement in EF 25%. Also seen by Dr. Prescott Gum for neuropathic sternal pain. Did not respond to gabapentin. Started Lyrica.   On 03/11/18 he did undergo dual chamber ICD/BiV pacemaker placement by Dr. Caryl Comes. Seen back in device clinic on April 20 and device parameters programmed for optimization. Despite these measures he  continued to have marked fatigue. We repeated his Echo which did show significant improvement in EF to 40-45%. We did stop fenofibrate and Lyrica sot see if this would help. He was given Ambien for insomnia.  On follow up he is doing a little better. Still has some fatigue but has started riding his bike. No dyspnea or edema. Still has incisional pain- unchanged.    Current Outpatient Medications on File Prior to Visit  Medication Sig Dispense Refill  . acetaminophen (TYLENOL) 500 MG tablet Take 1,000 mg by mouth 2 (two) times daily.    . Ascorbic Acid (VITAMIN C) 1000 MG tablet Take 1,000 mg by mouth daily.    Marland Kitchen aspirin 81 MG chewable tablet Chew 81 mg by mouth daily.    . carvedilol (COREG) 6.25 MG tablet Take 3.125 mg by mouth 2 (two) times daily with a meal. 1/2 tablet BID    . Evolocumab (REPATHA SURECLICK) 856 MG/ML SOAJ Inject 140 mg into the skin every 14 (fourteen) days. 6 pen 3  . Multiple Vitamins-Minerals (MENS MULTIVITAMIN PLUS PO) Take 1 tablet by mouth 2 (two) times daily.    Marland Kitchen OVER THE COUNTER MEDICATION Take 2 capsules  by mouth at bedtime. Deep Sleep    . Probiotic Product (PROBIOTIC PO) Take 1 capsule by mouth 2 (two) times daily.    . sacubitril-valsartan (ENTRESTO) 24-26 MG Take 0.5 tablets by mouth 2 (two) times daily.    Marland Kitchen. zolpidem (AMBIEN) 10 MG tablet Take 1 tablet (10 mg total) by mouth at bedtime. 30 tablet 3   No current facility-administered medications on file prior to visit.     Allergies  Allergen Reactions  . Ezetimibe Other (See Comments)    Joint pain and drops BP  . Niacin Other (See Comments)    Drops BP and severe  .  Statins Other (See Comments)    Severe joint pain and drops BP.    Past Medical History:  Diagnosis Date  . Arthritis    "mild; back, neck, spine" (06/09/2017)  . CHF (congestive heart failure) (HCC)   . Coronary artery disease   . History of gout   . History of kidney stones X 2  . Hypercholesterolemia   . Hypertension   . LBBB (left bundle branch block)   . MI (myocardial infarction) (HCC) 1993   LATERAL  . MI (myocardial infarction) (HCC) ?09/2001; 06/07/2017  . Pneumonia    "several times" (06/09/2017)  . S/P coronary artery stent placement    RCA  . Varicose veins     Past Surgical History:  Procedure Laterality Date  . ANTERIOR CERVICAL DECOMP/DISCECTOMY FUSION  03/03/2011   Procedure: ANTERIOR CERVICAL DECOMPRESSION/DISCECTOMY FUSION 3 LEVELS;  Surgeon: Karn CassisErnesto M Botero, MD;  Location: [facility];  Service: Neurosurgery;  Laterality: N/A;  Cervical three-four Cervical four-five Cervical five-six Anterior cervical decompression/diskectomy, fusion  . BACK SURGERY    . CARDIAC CATHETERIZATION  2009   Stents in RCA patent. 70 to 80% PL, and 50% ostial PD.   Marland Kitchen. CARDIAC CATHETERIZATION N/A 11/20/2014   Procedure: Left Heart Cath and Coronary Angiography;  Surgeon: Aijah Lattner M SwazilandJordan, MD;  [facility];  Service: Cardiovascular;  Laterality: N/A;  . CORONARY ANGIOPLASTY     DIRECT ANGIOPLASTY THE MARGINAL BRANCH  . CORONARY ANGIOPLASTY WITH STENT PLACEMENT     RCA  . CORONARY ARTERY BYPASS GRAFT N/A 06/14/2017   Procedure: CORONARY ARTERY BYPASS GRAFTING (CABG) x four, using left internal mammary artery and right leg greater saphenous vein harvested endoscopically;  Surgeon: Kerin PernaVan Trigt, Alexa Blish, MD;  [facility];  Service: Open Heart Surgery;  Laterality: N/A;  . CYSTOSCOPY N/A 06/14/2017   Procedure: CYSTOSCOPY;  Surgeon: Donata ClayVan Trigt, Theron AristaPeter, MD;  [facility];  Service: Open Heart Surgery;  Laterality: N/A;  . ICD IMPLANT N/A 03/11/2018   Procedure: ICD IMPLANT - Dual  Chamber;  Surgeon: Duke SalviaKlein, Steven C, MD;  [facility];  Service: Cardiovascular;  Laterality: N/A;  . INSERTION OF SUPRAPUBIC CATHETER N/A 06/14/2017   Procedure: INSERTION OF SUPRAPUBIC CATHETER - Lower abdomen;  Surgeon: Kerin PernaVan Trigt, Jacere Pangborn, MD;  [facility];  Service: Open Heart Surgery;  Laterality: N/A;  . POSTERIOR LUMBAR FUSION  10/2011  . RIGHT/LEFT HEART CATH AND CORONARY ANGIOGRAPHY N/A 06/08/2017   Procedure: RIGHT/LEFT HEART CATH AND CORONARY ANGIOGRAPHY;  Surgeon: SwazilandJordan, Caedyn Raygoza M, MD;  [facility];  Service: Cardiovascular;  Laterality: N/A;  . TEE WITHOUT CARDIOVERSION N/A 06/14/2017   Procedure: TRANSESOPHAGEAL ECHOCARDIOGRAM (TEE);  Surgeon: Donata ClayVan Trigt, Theron AristaPeter, MD;  [facility];  Service: Open Heart Surgery;  Laterality: N/A;  . URETHROPLASTY N/A 10/15/2017   Procedure: URETHROPLASTY  WITH BUCCAL GRAFT HARVAST;  Surgeon: Crist Fat, MD;  Location: [facility];  Service: Urology;  Laterality: N/A;    Social History   Tobacco Use  Smoking Status Former Smoker  . Packs/day: 2.00  . Years: 20.00  . Pack years: 40.00  . Types: Cigarettes  . Quit date: 05/07/1999  . Years since quitting: 19.2  Smokeless Tobacco Never Used    Social History   Substance and Sexual Activity  Alcohol Use Yes   Comment: 06/09/2017 "1 beer/month; at most"    Family History  Problem Relation Age of Onset  . Heart attack Father   . Diabetes Father   . Breast cancer Mother   . Coronary artery disease Mother   . Heart disease Brother   . Heart attack Maternal Grandfather     Review of Systems: The review of systems is per the HPI.  All other systems were reviewed and are negative.  Physical Exam: BP 118/80 (BP Location: Left Arm, Patient Position: Sitting, Cuff Size: Normal)   Pulse 80   Temp 97.7 F (36.5 C)   Ht 6\' 2"  (1.88 m)   Wt 225 lb (102.1 kg)   BMI 28.89 kg/m  GENERAL:  Well appearing overweight WM in NAD HEENT:  PERRL, EOMI, sclera are  clear. Oropharynx is clear. NECK:  No jugular venous distention, carotid upstroke brisk and symmetric, no bruits, no thyromegaly or adenopathy LUNGS:  Clear to auscultation bilaterally CHEST:  Unremarkable. ICD site has healed well. HEART:  RRR,  PMI not displaced or sustained,S1 and S2 within normal limits, no S3, no S4: no clicks, no rubs, no murmurs ABD:  Soft, nontender. BS +, no masses or bruits. No hepatomegaly, no splenomegaly EXT:  2 + pulses throughout, no edema, no cyanosis no clubbing SKIN:  Warm and dry.  No rashes NEURO:  Alert and oriented x 3. Cranial nerves II through XII intact. PSYCH:  Cognitively intact      LABORATORY DATA:  Lab Results  Component Value Date   WBC 6.8 03/04/2018   HGB 13.9 03/04/2018   HCT 43.0 03/04/2018   PLT 314 03/04/2018   GLUCOSE 92 03/04/2018   CHOL 109 10/29/2017   TRIG 135 10/29/2017   HDL 42 10/29/2017   LDLCALC 40 10/29/2017   ALT 19 10/11/2017   AST 24 10/11/2017   NA 138 03/04/2018   K 4.7 03/04/2018   CL 102 03/04/2018   CREATININE 1.06 03/04/2018   BUN 24 03/04/2018   CO2 23 03/04/2018   TSH 3.983 06/10/2017   INR 1.28 06/14/2017   HGBA1C 5.9 (H) 06/10/2017    Cardiac cath 06/08/17:  RIGHT/LEFT HEART CATH AND CORONARY ANGIOGRAPHY  Conclusion     Prox RCA lesion is 70% stenosed.  Mid RCA lesion is 50% stenosed.  Previously placed Mid RCA to Dist RCA stent (unknown type) is widely patent.  Previously placed Dist RCA stent (unknown type) is widely patent.  Post Atrio lesion is 50% stenosed.  Ost LAD to Prox LAD lesion is 99% stenosed.  Ost 1st Diag to 1st Diag lesion is 95% stenosed.  Ost Ramus to Ramus lesion is 80% stenosed.  Ost 1st Mrg lesion is 75% stenosed.  There is severe left ventricular systolic dysfunction.  LV end diastolic pressure is normal.  The left ventricular ejection fraction is less than 25% by visual estimate.  LV end diastolic pressure is normal.   1. Severe 3 vessel  obstructive CAD    -99% ostial LAD with  TIMI 1 flow. The vessel fills antegrade to the second diagonal. There are good right to left collaterals to the mid to distal LAD. The vessel appears large.    - 95% proximal first diagonal    - 80% proximal ramus intermediate    - 75% large OM1    - 70% proximal RCA 2. Severe LV dysfunction. EF estimated at 20%. 3. Low LVEDP and PCWP c/w effective diuresis 4. Normal right heart pressures 5. Normal cardiac output. Index 3.28.   Plan: Recommend CT surgery consult for revascularization- message sent. Echo pending. The ostial LAD is not suitable for PCI. Optimize medical therapy for CHF. Will discontinue amlodipine. Start Coreg. Consider Entresto and aldactone. If pacemaker/ICD needed in the future would recommend using a left subclavian approach.     Echo 09/14/17: Study Conclusions  - Left ventricle: The cavity size was severely dilated. Wall   thickness was normal. Systolic function was severely reduced. The   estimated ejection fraction was in the range of 25% to 30%.   Diffuse hypokinesis. Doppler parameters are consistent with   restrictive physiology, indicative of decreased left ventricular   diastolic compliance and/or increased left atrial pressure. - Aortic valve: There was mild regurgitation. - Aorta: Ascending aortic diameter: 40 mm (S). - Left atrium: The atrium was mildly dilated.  Impressions:  - Severe global reduction in LV systolic function; severe LVE;   restrictive filling; mildly dilated ascending aorta; mild AI;   mild LAE.  Echo 01/21/18: Study Conclusions  - Procedure narrative: Transthoracic echocardiography. Image   quality was adequate. The study was technically difficult. - Left ventricle: The cavity size was severely dilated. Wall   thickness was increased in a pattern of mild LVH. The estimated   ejection fraction was 25%. Diffuse hypokinesis. The study is not   technically sufficient to allow evaluation of  LV diastolic   function. - Aortic valve: There was mild regurgitation. - Mitral valve: Calcified annulus. Mildly thickened leaflets . - Left atrium: The atrium was mildly dilated. - Atrial septum: No defect or patent foramen ovale was identified.  Echo 07/11/18: IMPRESSIONS    1. The left ventricle has mild-moderately reduced systolic function, with an ejection fraction of 40-45%. The cavity size was normal. Left ventricular diastolic Doppler parameters are indeterminate. Indeterminate filling pressures.  2. The right ventricle has normal systolic function. The cavity was normal. There is no increase in right ventricular wall thickness. Right ventricular systolic pressure could not be assessed.  3. Left atrial size was mildly dilated.  4. Aortic valve regurgitation is mild to moderate by color flow Doppler. No stenosis of the aortic valve.  5. The Ascending aorta measures 39 mm, within normal limits when indexed to body surface area.  6. When compared to the prior study: 01/21/2018- LV ejection fraction has improved. Ventricular septal contraction is less dysynchronous. Aortic valve regurgitation has not significantly changed. Side by side comparison of images performed.  Assessment / Plan: 1. Coronary disease status post stenting of the right coronary 2009. Presentation in June 2019 with acute pulmonary edema and NSTEMI. Cath showed severe 3 vessel CAD. Now  S/p CABG. No recurrent angina.  2. Chronic systolic CHF. EF persistently low on optimal medical therapy and post revascularization. EF 25-30%. NYHA class 1-2. Difficulty with titration of medication due to low BP before. Now on low doses of Entresto and Coreg. LBBB.Marland Kitchen. He is s/p CRT-D therapy. His BiV pacing has improved his QRS duration. Repeat Echo showed significant improvement in  EF to 40-45%. We will continue current therapy  3. History of Syncope with WCT. Now with ICD in place  4. Hypercholesterolemia. Patient is intolerant to  multiple medications. He is on Repatha now.   LDL 40 is at goal. Triglycerides are normal-  Fenofibrate discontinued.  5. LBBB chronic  6. CKD stage 2  7. Sternal pain, neuropathic. No change  8. Fatigue- multifactorial. Will continue focus on regular exercise to improve conditioning.

## 2018-07-18 ENCOUNTER — Other Ambulatory Visit: Payer: Self-pay

## 2018-07-18 ENCOUNTER — Encounter: Payer: Self-pay | Admitting: Cardiology

## 2018-07-18 ENCOUNTER — Ambulatory Visit (INDEPENDENT_AMBULATORY_CARE_PROVIDER_SITE_OTHER): Payer: BC Managed Care – PPO | Admitting: Cardiology

## 2018-07-18 VITALS — BP 118/80 | HR 80 | Temp 97.7°F | Ht 74.0 in | Wt 225.0 lb

## 2018-07-18 DIAGNOSIS — I447 Left bundle-branch block, unspecified: Secondary | ICD-10-CM | POA: Diagnosis not present

## 2018-07-18 DIAGNOSIS — Z95810 Presence of automatic (implantable) cardiac defibrillator: Secondary | ICD-10-CM

## 2018-07-18 DIAGNOSIS — I5022 Chronic systolic (congestive) heart failure: Secondary | ICD-10-CM

## 2018-07-18 DIAGNOSIS — I2510 Atherosclerotic heart disease of native coronary artery without angina pectoris: Secondary | ICD-10-CM | POA: Diagnosis not present

## 2018-07-18 DIAGNOSIS — E7800 Pure hypercholesterolemia, unspecified: Secondary | ICD-10-CM

## 2018-07-18 DIAGNOSIS — I2583 Coronary atherosclerosis due to lipid rich plaque: Secondary | ICD-10-CM

## 2018-07-18 DIAGNOSIS — Z951 Presence of aortocoronary bypass graft: Secondary | ICD-10-CM | POA: Diagnosis not present

## 2018-07-18 NOTE — Patient Instructions (Signed)
Continue your current therapy  Follow up in 6 months   

## 2018-07-21 ENCOUNTER — Ambulatory Visit (INDEPENDENT_AMBULATORY_CARE_PROVIDER_SITE_OTHER): Payer: BC Managed Care – PPO | Admitting: *Deleted

## 2018-07-21 DIAGNOSIS — I255 Ischemic cardiomyopathy: Secondary | ICD-10-CM

## 2018-07-22 LAB — CUP PACEART REMOTE DEVICE CHECK
Battery Remaining Longevity: 50 mo
Battery Voltage: 3 V
Brady Statistic AP VP Percent: 0.07 %
Brady Statistic AP VS Percent: 0.02 %
Brady Statistic AS VP Percent: 99.68 %
Brady Statistic AS VS Percent: 0.24 %
Brady Statistic RA Percent Paced: 0.08 %
Brady Statistic RV Percent Paced: 97.67 %
Date Time Interrogation Session: 20200716062703
HighPow Impedance: 78 Ohm
Implantable Lead Implant Date: 20190610
Implantable Lead Implant Date: 20200306
Implantable Lead Implant Date: 20200306
Implantable Lead Location: 753858
Implantable Lead Location: 753859
Implantable Lead Location: 753860
Implantable Lead Model: 5071
Implantable Lead Model: 5076
Implantable Pulse Generator Implant Date: 20200306
Lead Channel Impedance Value: 323 Ohm
Lead Channel Impedance Value: 399 Ohm
Lead Channel Impedance Value: 4047 Ohm
Lead Channel Impedance Value: 4047 Ohm
Lead Channel Impedance Value: 456 Ohm
Lead Channel Impedance Value: 494 Ohm
Lead Channel Pacing Threshold Amplitude: 0.625 V
Lead Channel Pacing Threshold Amplitude: 0.875 V
Lead Channel Pacing Threshold Amplitude: 1.875 V
Lead Channel Pacing Threshold Pulse Width: 0.4 ms
Lead Channel Pacing Threshold Pulse Width: 0.4 ms
Lead Channel Pacing Threshold Pulse Width: 1 ms
Lead Channel Sensing Intrinsic Amplitude: 3.75 mV
Lead Channel Sensing Intrinsic Amplitude: 3.75 mV
Lead Channel Sensing Intrinsic Amplitude: 31.625 mV
Lead Channel Sensing Intrinsic Amplitude: 31.625 mV
Lead Channel Setting Pacing Amplitude: 1.75 V
Lead Channel Setting Pacing Amplitude: 2.5 V
Lead Channel Setting Pacing Amplitude: 3 V
Lead Channel Setting Pacing Pulse Width: 0.4 ms
Lead Channel Setting Pacing Pulse Width: 1 ms
Lead Channel Setting Sensing Sensitivity: 0.3 mV

## 2018-07-27 IMAGING — CR DG CHEST 2V
2 series · 2 of 2 positions shown · non-contrast
Comparison: 06/22/2017

CLINICAL DATA: Left-sided chest pain.  One month postop from CABG.

EXAM:
CHEST - 2 VIEW

[w chest pa]
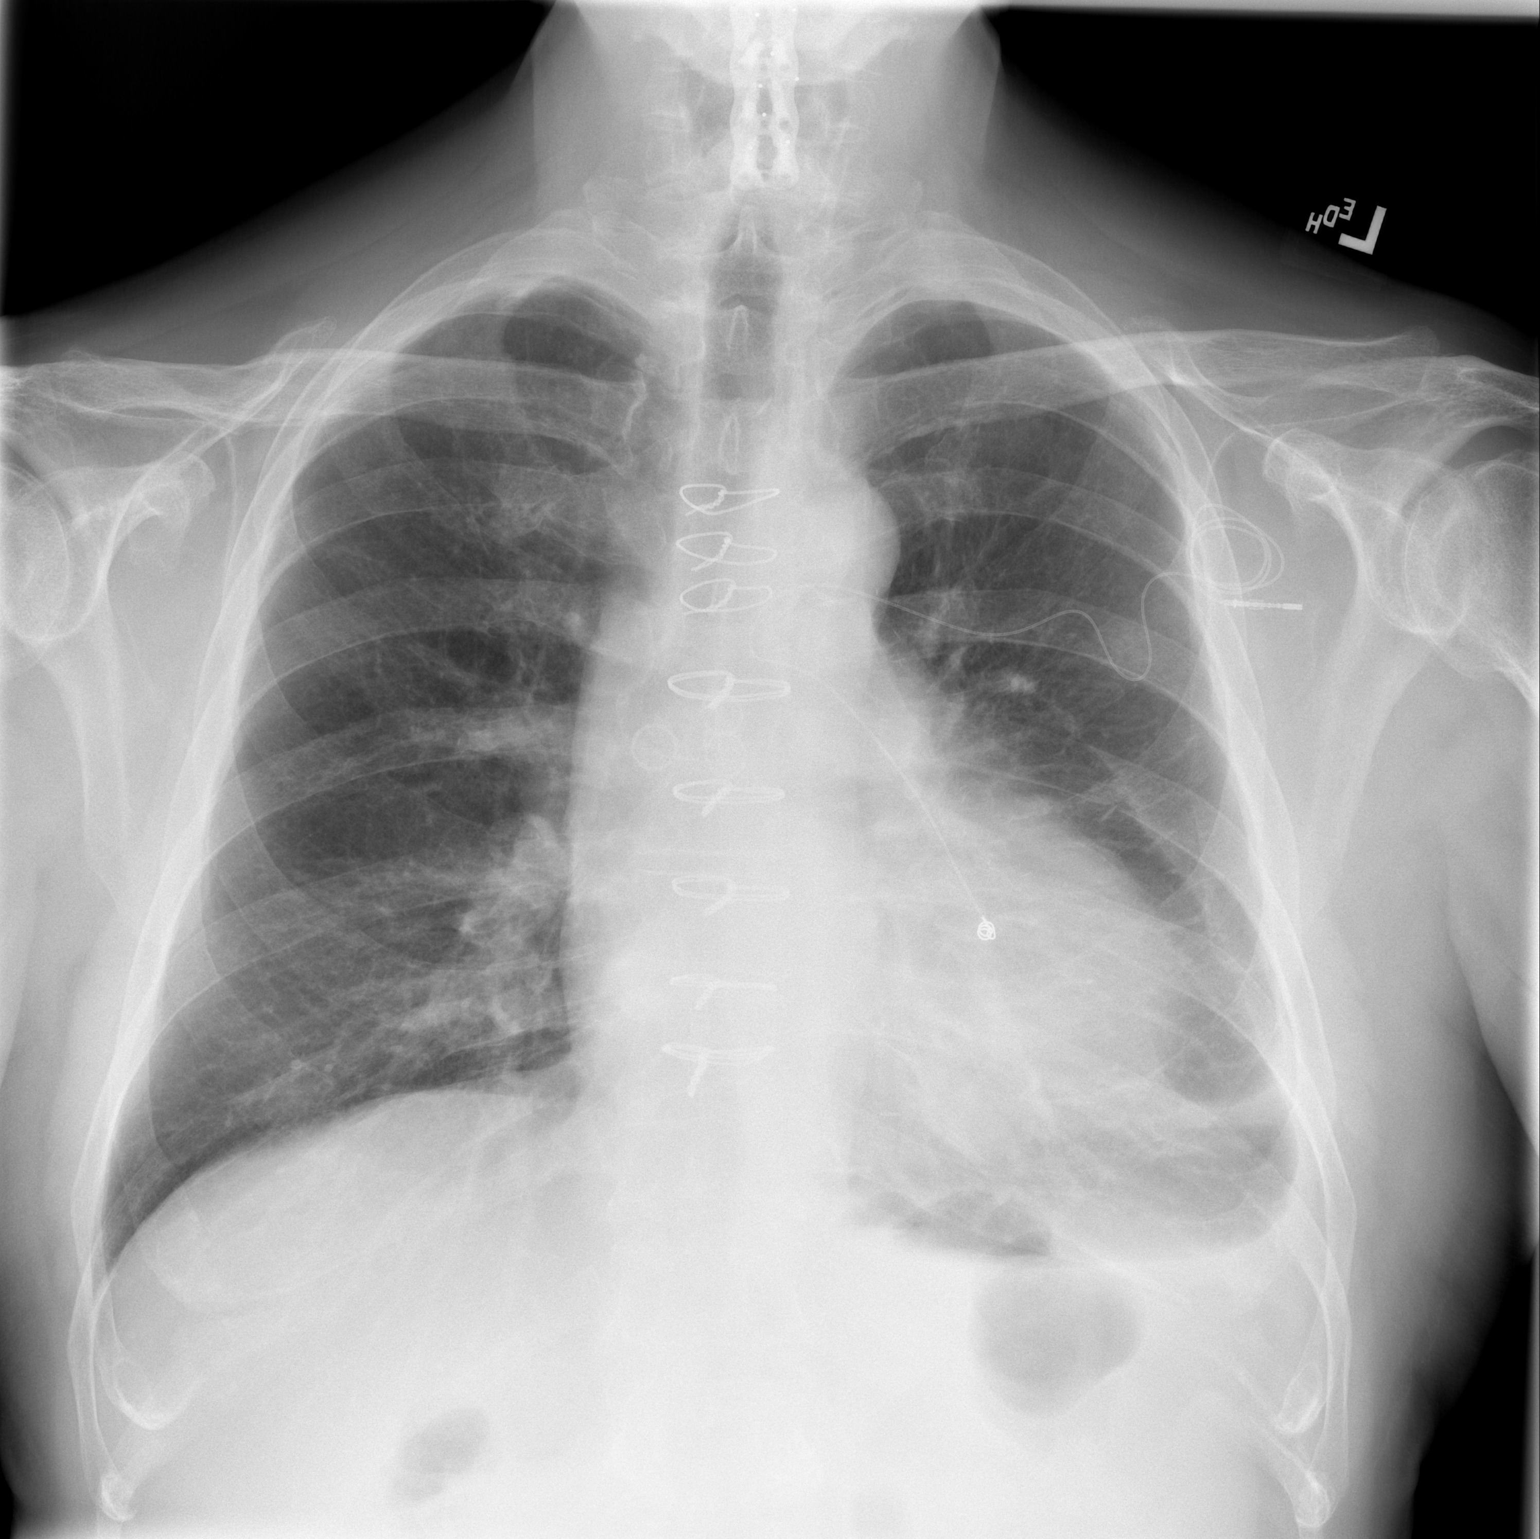

[w chest lat]
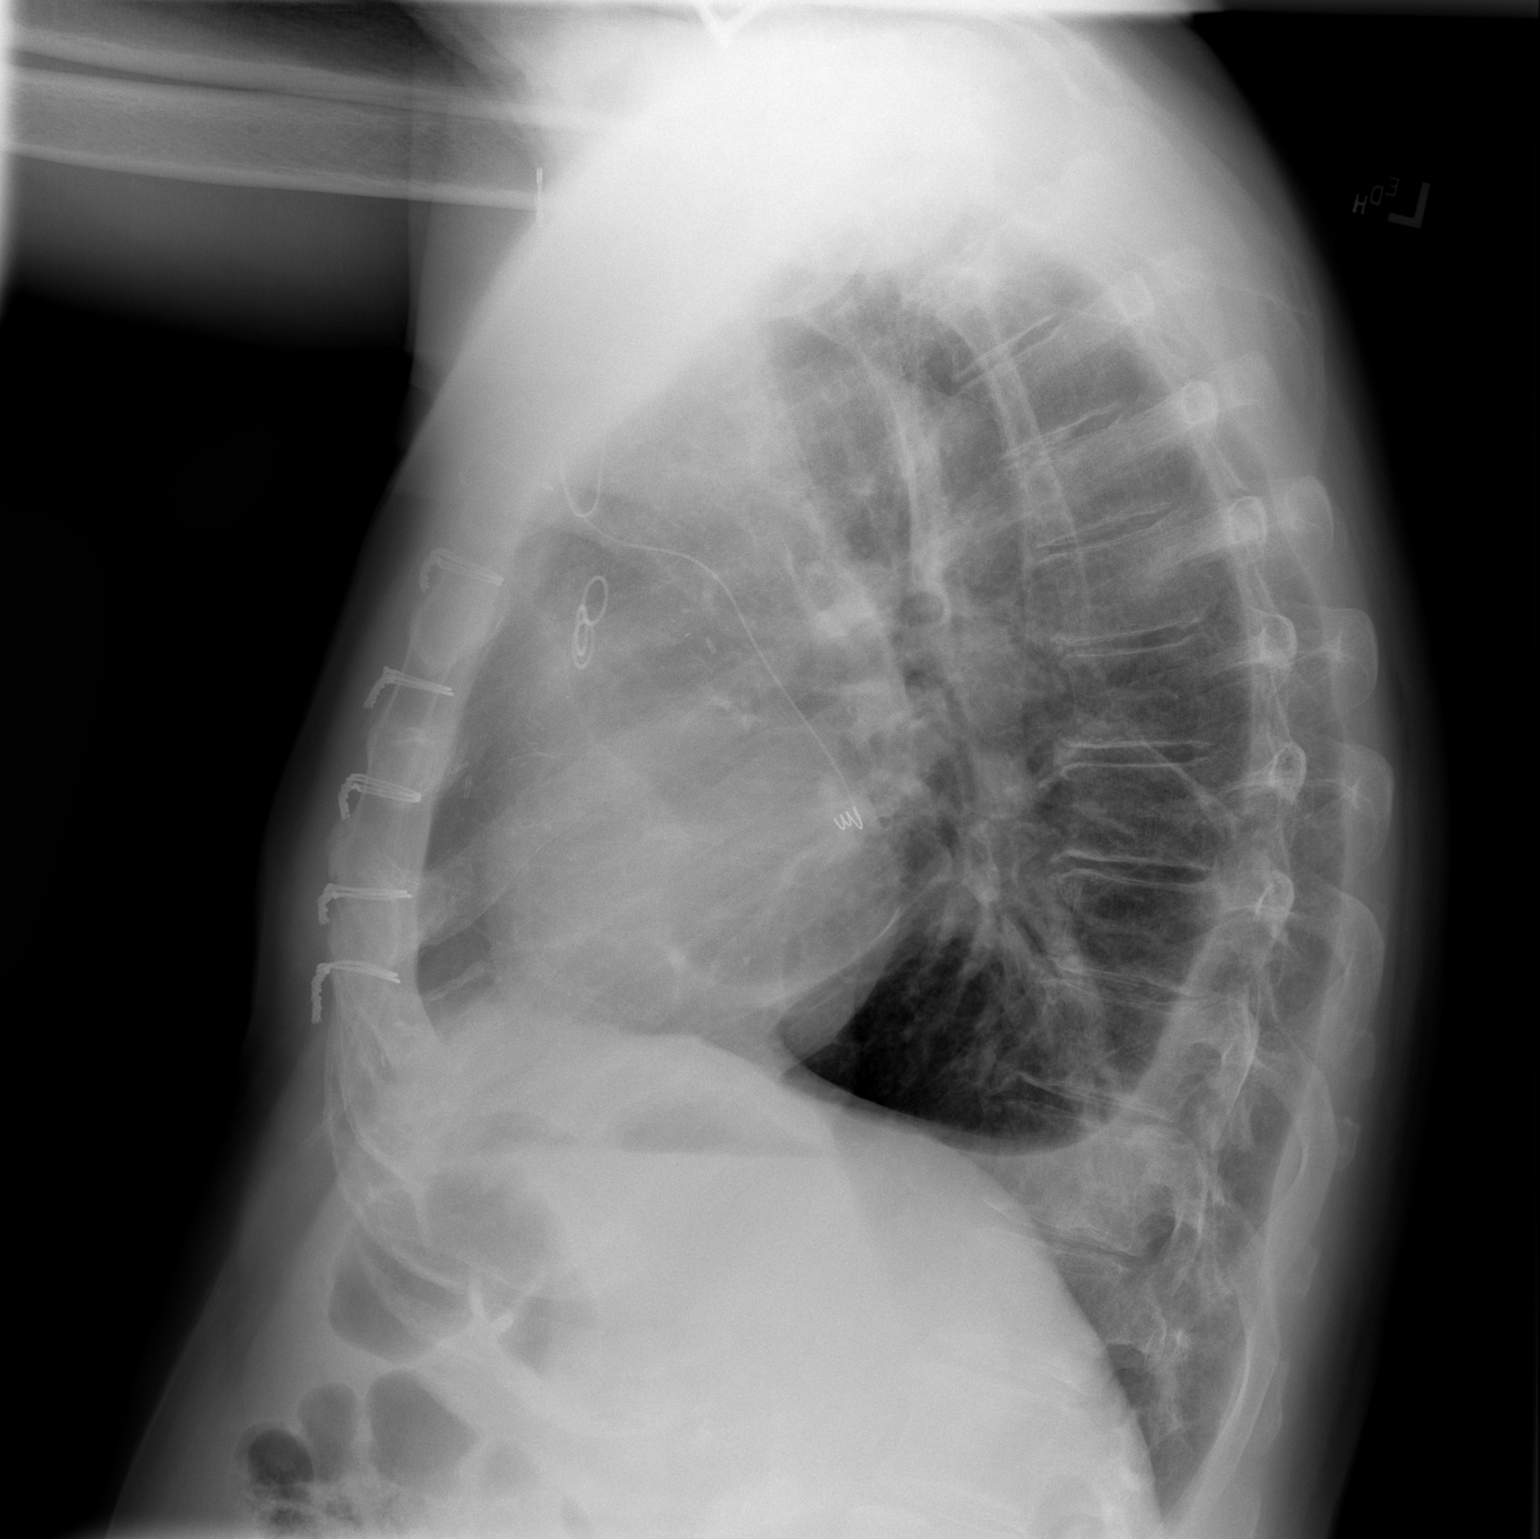

[2 of 2 positions shown; findings below may reference images not displayed]

FINDINGS: The heart size and mediastinal contours are within normal limits.
Prior CABG noted. Small left pleural effusion and mild left lower
lobe atelectasis is decreased since previous study. No evidence of
pneumothorax or other acute findings.
IMPRESSION: Decreased small left pleural effusion and left lower lobe
atelectasis since prior study. No evidence of pneumothorax or other
acute findings.

## 2018-07-28 ENCOUNTER — Encounter: Payer: Self-pay | Admitting: Cardiology

## 2018-07-28 NOTE — Progress Notes (Signed)
Remote ICD transmission.   

## 2018-08-05 ENCOUNTER — Telehealth: Payer: Self-pay | Admitting: *Deleted

## 2018-08-05 NOTE — Telephone Encounter (Signed)
Spoke with patient regarding manual transmission sent in on 08/04/18. Presenting rhythm shows BiV pacing with TWOS resulting in loss of CRT pacing at times. Total BiV pacing 86% since last transmission on 07/21/18 (measured 90% at that time). Also note occasional "low" R-waves, likely due to measurement of T-waves during sensing test, RV sensitivity currently measures 0.34mV. RV threshold and impedance trends stable. SIC 0.  Pt reports overall he is doing well. ShOB and fatigue have improved. He does report noticing an occasional "tinge", like an electrical sensation, near his PPM the past couple of days, so he sent in this transmission. Not painful, just different. Advised I do not think that sensation is related to his current device function/programming. Noted he is overdue for his 41 day f/u. Patient is agreeable to an appointment with Tommye Standard, PA, on 08/12/18. He is aware to call in the interim with any concerns.  Presenting:

## 2018-08-10 ENCOUNTER — Telehealth: Payer: Self-pay | Admitting: Cardiology

## 2018-08-10 MED ORDER — REPATHA SURECLICK 140 MG/ML ~~LOC~~ SOAJ: 140.0000 mg | SUBCUTANEOUS | 3 refills | Status: DC

## 2018-08-10 NOTE — Telephone Encounter (Signed)
Great idea\ Raquel Sarna Thanks SK

## 2018-08-10 NOTE — Telephone Encounter (Signed)
Called and spoke to the pt regarding the pharmacy change and the pt needed the repatha copay card info so I gave him the number to call

## 2018-08-10 NOTE — Telephone Encounter (Signed)
Patient called stating his prescription for repatha needs to be switched over to [facility] on [address].  [facility] has gone out of business.

## 2018-08-12 ENCOUNTER — Encounter: Payer: Self-pay | Admitting: Physician Assistant

## 2018-08-12 ENCOUNTER — Other Ambulatory Visit: Payer: Self-pay

## 2018-08-12 ENCOUNTER — Ambulatory Visit (INDEPENDENT_AMBULATORY_CARE_PROVIDER_SITE_OTHER): Payer: BC Managed Care – PPO | Admitting: Physician Assistant

## 2018-08-12 VITALS — BP 134/84 | HR 80 | Ht 74.0 in | Wt 225.0 lb

## 2018-08-12 DIAGNOSIS — I255 Ischemic cardiomyopathy: Secondary | ICD-10-CM | POA: Diagnosis not present

## 2018-08-12 DIAGNOSIS — I2510 Atherosclerotic heart disease of native coronary artery without angina pectoris: Secondary | ICD-10-CM | POA: Diagnosis not present

## 2018-08-12 DIAGNOSIS — Z95810 Presence of automatic (implantable) cardiac defibrillator: Secondary | ICD-10-CM | POA: Diagnosis not present

## 2018-08-12 DIAGNOSIS — I5022 Chronic systolic (congestive) heart failure: Secondary | ICD-10-CM | POA: Diagnosis not present

## 2018-08-12 NOTE — Patient Instructions (Signed)
Medication Instructions:  Your physician recommends that you continue on your current medications as directed. Please refer to the Current Medication list given to you today.  If you need a refill on your cardiac medications before your next appointment, please call your pharmacy.   Lab work: NONE ORDERED  TODAY   If you have labs (blood work) drawn today and your tests are completely normal, you will receive your results only by: Marland Kitchen MyChart Message (if you have MyChart) OR . A paper copy in the mail If you have any lab test that is abnormal or we need to change your treatment, we will call you to review the results.  Testing/Procedures: NONE ORDERED  TODAY    Follow-Up: At [facility], you and your health needs are our priority.  As part of our continuing mission to provide you with exceptional heart care, we have created designated Provider Care Teams.  These Care Teams include your primary Cardiologist (physician) and Advanced Practice Providers (APPs -  Physician Assistants and Nurse Practitioners) who all work together to provide you with the care you need, when you need it. You will need a follow up appointment in 6 months.  Please call our office 2 months in advance to schedule this appointment.  You may see Dr. Caryl Comes  or one of the following Advanced Practice Providers on your designated Care Team:   Youlanda Roys PA-C  . Tommye Standard, PA-C  Any Other Special Instructions Will Be Listed Below (If Applicable).

## 2018-08-12 NOTE — Progress Notes (Signed)
Cardiology Office Note Date:  08/12/2018  Patient ID:  [name], DOB June 18, 1955, MRN [id number] PCP:  Jarome MatinPaterson, Daniel, MD  Cardiologist:  Dr. SwazilandJordan Electrophysiologist: Dr. Graciela HusbandsKlein    Chief Complaint: evaluate R waves, TWOS on remote noted  History of Present Illness: [name] MarinJames F [name] is a 63 y.o. male with history of CAD (PCI to RCA in 2009), stable CAD by cath in 2012 with patent stent >> 06/23/17 flash pulm edema Cath with 3v CAD >> CABG 04/10/17. Urethral stricture w/suprapubic catheter.  HTN, HLD, CKD (II)  June 2019 syncope at urology office admitted noted to have  30beats of VT, BB adjusted to BP tolerance, Life vest placed, LVEF 35-40% BP unable to tolerate Entresto or titration of his BB Sep 2019, LVEF 25-30% Oct 2019 urethroplasty with buccal mucosa onlay graft for his urethral stricure. Suprapubic catheter removed Jan 2020,  echo LVEF 25% 03/11/18 CRT-D implant  In review of Dr. Elvis CoilJordan's notes, post implant felt like he had onset of significant fatigue, exhausted by mid-day, slow increased weight without edema, , no symptoms of fluid OL, no angina, did c/o insomnia.  His Lyrica (for neuropathic sternal pain) and his fenofibrate were stopped and rx Ambien to help with insomnia in may.  The patient has had a number of calls with what sounds skin sensitivity at device site, site discomfort that rad towards central chest, ongoing fatigue.  He has had his LV reprogrammed a number of times in effort to improve this. F/u TTE 07/11/2018 had improvement in his LVEF to 40-45%   He saw Dr. SwazilandJordan 07/18/2018, was doing a bit better, riding bike, continued to report incisional pain.  He was called in today 2/2 remote that noted TWOS resulting in diminished CRT pacing and concerns of issues of TWOS for him  He continues to feel like he is doing better.   Not 100% but better.  He is riding his bike, inside during hot days but has gotten outside a little as well.  No CP (ouside of some  neuropathic pain), no SOB, no symptoms of PND or orthopnea, no dizziness, near syncope or syncope.  His site pain is located more left of his sternal scar, he feel like is is medial ICD that radiaes towards center and is only when laying supine  Device information MDT CRT-D, implanted 03/11/18, ICM w/syncope   Past Medical History:  Diagnosis Date  . Arthritis    "mild; back, neck, spine" (06/09/2017)  . CHF (congestive heart failure) (HCC)   . Coronary artery disease   . History of gout   . History of kidney stones X 2  . Hypercholesterolemia   . Hypertension   . LBBB (left bundle branch block)   . MI (myocardial infarction) (HCC) 1993   LATERAL  . MI (myocardial infarction) (HCC) ?09/2001; 06/07/2017  . Pneumonia    "several times" (06/09/2017)  . S/P coronary artery stent placement    RCA  . Varicose veins     Past Surgical History:  Procedure Laterality Date  . ANTERIOR CERVICAL DECOMP/DISCECTOMY FUSION  03/03/2011   Procedure: ANTERIOR CERVICAL DECOMPRESSION/DISCECTOMY FUSION 3 LEVELS;  Surgeon: Karn CassisErnesto M Botero, MD;  Location: [facility];  Service: Neurosurgery;  Laterality: N/A;  Cervical three-four Cervical four-five Cervical five-six Anterior cervical decompression/diskectomy, fusion  . BACK SURGERY    . CARDIAC CATHETERIZATION  2009   Stents in RCA patent. 70 to 80% PL, and 50% ostial PD.   Marland Kitchen. CARDIAC CATHETERIZATION N/A 11/20/2014  Procedure: Left Heart Cath and Coronary Angiography;  Surgeon: Peter M Martinique, MD;  Location: [facility] [facility];  Service: Cardiovascular;  Laterality: N/A;  . CORONARY ANGIOPLASTY     DIRECT ANGIOPLASTY THE MARGINAL BRANCH  . CORONARY ANGIOPLASTY WITH STENT PLACEMENT     RCA  . CORONARY ARTERY BYPASS GRAFT N/A 06/14/2017   Procedure: CORONARY ARTERY BYPASS GRAFTING (CABG) x four, using left internal mammary artery and right leg greater saphenous vein harvested endoscopically;  Surgeon: Ivin Poot, MD;  Location: [facility];  Service: Open  Heart Surgery;  Laterality: N/A;  . CYSTOSCOPY N/A 06/14/2017   Procedure: CYSTOSCOPY;  Surgeon: Prescott Gum, Collier Salina, MD;  Location: [facility];  Service: Open Heart Surgery;  Laterality: N/A;  . ICD IMPLANT N/A 03/11/2018   Procedure: ICD IMPLANT - Dual Chamber;  Surgeon: Deboraha Sprang, MD;  Location: [place] [facility];  Service: Cardiovascular;  Laterality: N/A;  . INSERTION OF SUPRAPUBIC CATHETER N/A 06/14/2017   Procedure: INSERTION OF SUPRAPUBIC CATHETER - Lower abdomen;  Surgeon: Ivin Poot, MD;  Location: [facility];  Service: Open Heart Surgery;  Laterality: N/A;  . POSTERIOR LUMBAR FUSION  10/2011  . RIGHT/LEFT HEART CATH AND CORONARY ANGIOGRAPHY N/A 06/08/2017   Procedure: RIGHT/LEFT HEART CATH AND CORONARY ANGIOGRAPHY;  Surgeon: Martinique, Peter M, MD;  Location: [place] [facility];  Service: Cardiovascular;  Laterality: N/A;  . TEE WITHOUT CARDIOVERSION N/A 06/14/2017   Procedure: TRANSESOPHAGEAL ECHOCARDIOGRAM (TEE);  Surgeon: Prescott Gum, Collier Salina, MD;  Location: [facility];  Service: Open Heart Surgery;  Laterality: N/A;  . URETHROPLASTY N/A 10/15/2017   Procedure: URETHROPLASTY WITH BUCCAL GRAFT HARVAST;  Surgeon: Ardis Hughs, MD;  Location: [facility];  Service: Urology;  Laterality: N/A;    Current Outpatient Medications  Medication Sig Dispense Refill  . acetaminophen (TYLENOL) 500 MG tablet Take 1,000 mg by mouth 2 (two) times daily.    . Ascorbic Acid (VITAMIN C) 1000 MG tablet Take 1,000 mg by mouth daily.    Marland Kitchen aspirin 81 MG chewable tablet Chew 81 mg by mouth daily.    . carvedilol (COREG) 6.25 MG tablet Take 3.125 mg by mouth 2 (two) times daily with a meal. 1/2 tablet BID    . Evolocumab (REPATHA SURECLICK) 542 MG/ML SOAJ Inject 140 mg into the skin every 14 (fourteen) days. 6 pen 3  . Multiple Vitamins-Minerals (MENS MULTIVITAMIN PLUS PO) Take 1 tablet by mouth 2 (two) times daily.    Marland Kitchen OVER THE COUNTER MEDICATION Take 2 capsules by mouth at bedtime. Deep Sleep    . Probiotic Product  (PROBIOTIC PO) Take 1 capsule by mouth 2 (two) times daily.    . sacubitril-valsartan (ENTRESTO) 24-26 MG Take 0.5 tablets by mouth 2 (two) times daily.    Marland Kitchen zolpidem (AMBIEN) 10 MG tablet Take 1 tablet (10 mg total) by mouth at bedtime. 30 tablet 3   No current facility-administered medications for this visit.     Allergies:   Ezetimibe, Niacin, and Statins   Social History:  The patient  reports that he quit smoking about 19 years ago. His smoking use included cigarettes. He has a 40.00 pack-year smoking history. He has never used smokeless tobacco. He reports current alcohol use. He reports that he does not use drugs.   Family History:  The patient's family history includes Breast cancer in his mother; Coronary artery disease in his mother; Diabetes in his father; Heart attack in his father and maternal grandfather; Heart disease in  his brother.  ROS:  Please see the history of present illness.  All other systems are reviewed and otherwise negative.   PHYSICAL EXAM:  VS:  BP 134/84   Pulse 80   Ht 6\' 2"  (1.88 m)   Wt 225 lb (102.1 kg)   BMI 28.89 kg/m  BMI: Body mass index is 28.89 kg/m. Well nourished, well developed, in no acute distress  HEENT: normocephalic, atraumatic  Neck: no JVD, carotid bruits or masses Cardiac:  RRR; no significant murmurs, no rubs, or gallops Lungs:  CTA b/l, no wheezing, rhonchi or rales  Abd: soft, nontender MS: no deformity or atrophy Ext: no edema  Skin: warm and dry, no rash Neuro:  No gross deficits appreciated Psych: euthymic mood, full affect  ICD site is stable, no tethering or discomfort   EKG:  Not done today  ICD interrogation done today and reviewed by myself:  Done today with MDT representative Battery and lead measurements are good.  No arrhythmias, no therapies.  TWOS is not appreciated at baseline, we are able to see it with LV only pacing intermittently The remote noted TWOS with BIVE pacing.    07/11/2018: TTE  IMPRESSIONS 1. The left ventricle has mild-moderately reduced systolic function, with an ejection fraction of 40-45%. The cavity size was normal. Left ventricular diastolic Doppler parameters are indeterminate. Indeterminate filling pressures.  2. The right ventricle has normal systolic function. The cavity was normal. There is no increase in right ventricular wall thickness. Right ventricular systolic pressure could not be assessed.  3. Left atrial size was mildly dilated.  4. Aortic valve regurgitation is mild to moderate by color flow Doppler. No stenosis of the aortic valve.  5. The Ascending aorta measures 39 mm, within normal limits when indexed to body surface area.  6. When compared to the prior study: 01/21/2018- LV ejection fraction has improved. Ventricular septal contraction is less dysynchronous. Aortic valve regurgitation has not significantly changed. Side by side comparison of images performed.  FINDINGS  Left Ventricle: The left ventricle has mild-moderately reduced systolic function, with an ejection fraction of 40-45%. The cavity size was normal. There is no increase in left ventricular wall thickness. Left ventricular diastolic Doppler parameters are  indeterminate. Indeterminate filling pressures  Right Ventricle: The right ventricle has normal systolic function. The cavity was normal. There is no increase in right ventricular wall thickness. Right ventricular systolic pressure could not be assessed. Pacing wire/catheter visualized in the right  ventricle.  Left Atrium: Left atrial size was mildly dilated.  Right Atrium: Right atrial size was normal in size.  Interatrial Septum: The interatrial septum was not well visualized.  Pericardium: There is no evidence of pericardial effusion.  Mitral Valve: The mitral valve is normal in structure. Mitral valve regurgitation is trivial by color flow Doppler.  Tricuspid Valve: The tricuspid valve is normal in structure.  Tricuspid valve regurgitation is trivial by color flow Doppler.  Aortic Valve: The aortic valve is normal in structure. Aortic valve regurgitation is mild to moderate by color flow Doppler. There is No stenosis of the aortic valve.  Pulmonic Valve: The pulmonic valve was normal in structure. Pulmonic valve regurgitation is trivial by color flow Doppler.  Aorta: The Ascending aorta measures 39 mm, within normal limits when indexed to body surface area.  Venous: The inferior vena cava was not well visualized.  Compared to previous exam: 01/21/2018- LV ejection fraction has improved. Ventricular septal contraction is less dysynchronous. Aortic valve regurgitation has not significantly  changed. Side by side comparison of images performed.       01/21/2018: TTE Study Conclusions - Procedure narrative: Transthoracic echocardiography. Image   quality was adequate. The study was technically difficult. - Left ventricle: The cavity size was severely dilated. Wall   thickness was increased in a pattern of mild LVH. The estimated   ejection fraction was 25%. Diffuse hypokinesis. The study is not   technically sufficient to allow evaluation of LV diastolic   function. - Aortic valve: There was mild regurgitation. - Mitral valve: Calcified annulus. Mildly thickened leaflets . - Left atrium: The atrium was mildly dilated. - Atrial septum: No defect or patent foramen ovale was identified.    06/08/17: R/LHC  Prox RCA lesion is 70% stenosed.  Mid RCA lesion is 50% stenosed.  Previously placed Mid RCA to Dist RCA stent (unknown type) is widely patent.  Previously placed Dist RCA stent (unknown type) is widely patent.  Post Atrio lesion is 50% stenosed.  Ost LAD to Prox LAD lesion is 99% stenosed.  Ost 1st Diag to 1st Diag lesion is 95% stenosed.  Ost Ramus to Ramus lesion is 80% stenosed.  Ost 1st Mrg lesion is 75% stenosed.  There is severe left ventricular systolic dysfunction.   LV end diastolic pressure is normal.  The left ventricular ejection fraction is less than 25% by visual estimate.  LV end diastolic pressure is normal.   1. Severe 3 vessel obstructive CAD    -99% ostial LAD with TIMI 1 flow. The vessel fills antegrade to the second diagonal. There are good right to left collaterals to the mid to distal LAD. The vessel appears large.    - 95% proximal first diagonal    - 80% proximal ramus intermediate    - 75% large OM1    - 70% proximal RCA 2. Severe LV dysfunction. EF estimated at 20%. 3. Low LVEDP and PCWP c/w effective diuresis 4. Normal right heart pressures 5. Normal cardiac output. Index 3.28.   Plan: Recommend CT surgery consult for revascularization- message sent. Echo pending. The ostial LAD is not suitable for PCI. Optimize medical therapy for CHF. Will discontinue amlodipine. Start Coreg. Consider Entresto and aldactone. If pacemaker/ICD needed in the future would recommend using a left subclavian approach.      Recent Labs: 10/11/2017: ALT 19 03/04/2018: BUN 24; Creatinine, Ser 1.06; Hemoglobin 13.9; Platelets 314; Potassium 4.7; Sodium 138  10/29/2017: Cholesterol 109; HDL 42; LDL Cholesterol 40; Total CHOL/HDL Ratio 2.6; Triglycerides 135; VLDL 27   CrCl cannot be calculated (Patient's most recent lab result is older than the maximum 21 days allowed.).   Wt Readings from Last 3 Encounters:  08/12/18 225 lb (102.1 kg)  07/18/18 225 lb (102.1 kg)  05/25/18 231 lb 3.2 oz (104.9 kg)     Other studies reviewed: Additional studies/records reviewed today include: summarized above  ASSESSMENT AND PLAN:  1. CRT-D     There was previous discussion of possibly doing  echo-opt, though dontt think this is neccessary given improvement in LVEF by echo     site complaints, had neuropathic pain at sternal incision as well, his site looks excellent  The patient had TWOS noted on remote In communication with Dr. Graciela Husbands planned to reduce  sensitivity to 0.6 With MDT rep and tech services RV sensitivity was changed to 0.50mV, his post pacing V.blanking only was change was increased from >239ms, this required his upper tracking rate be changed decreased to  I discussed  with the patient   2. ICM     No symptoms or exam findings of fluid OL, OptiVol is at threshold     On BB, Entresto   3. CAD     No anginal sounding complaints     On ASA, Repatha     C/w Dr. SwazilandJordan    Disposition: F/u with Q 3 mo remotes, discussed annual follow up, felt most comfortable with a 62mo follow up, sooner if needed.  Current medicines are reviewed at length with the patient today.  The patient did not have any concerns regarding medicines.  Norma FredricksonSigned, Kavish Lafitte, PA-C 08/12/2018 3:25 PM     [facility] [address] [place] [address] [phone] (office)  [phone] (fax)

## 2018-08-17 ENCOUNTER — Other Ambulatory Visit: Payer: Self-pay | Admitting: Cardiology

## 2018-08-17 NOTE — Telephone Encounter (Signed)
OK to refill ambien  Cuyler Vandyken Martinique MD, [facility]

## 2018-10-12 ENCOUNTER — Other Ambulatory Visit (HOSPITAL_COMMUNITY): Payer: Self-pay | Admitting: Cardiology

## 2018-10-20 ENCOUNTER — Ambulatory Visit (INDEPENDENT_AMBULATORY_CARE_PROVIDER_SITE_OTHER): Payer: BC Managed Care – PPO | Admitting: *Deleted

## 2018-10-20 DIAGNOSIS — I5022 Chronic systolic (congestive) heart failure: Secondary | ICD-10-CM | POA: Diagnosis not present

## 2018-10-20 DIAGNOSIS — I447 Left bundle-branch block, unspecified: Secondary | ICD-10-CM

## 2018-10-20 LAB — CUP PACEART REMOTE DEVICE CHECK
Battery Remaining Longevity: 48 mo
Battery Voltage: 2.98 V
Brady Statistic AP VP Percent: 0.22 %
Brady Statistic AP VS Percent: 0.09 %
Brady Statistic AS VP Percent: 99.4 %
Brady Statistic AS VS Percent: 0.29 %
Brady Statistic RA Percent Paced: 0.31 %
Brady Statistic RV Percent Paced: 98.9 %
Date Time Interrogation Session: 20201015073422
HighPow Impedance: 72 Ohm
Implantable Lead Implant Date: 20190610
Implantable Lead Implant Date: 20200306
Implantable Lead Implant Date: 20200306
Implantable Lead Location: 753858
Implantable Lead Location: 753859
Implantable Lead Location: 753860
Implantable Lead Model: 5071
Implantable Lead Model: 5076
Implantable Pulse Generator Implant Date: 20200306
Lead Channel Impedance Value: 323 Ohm
Lead Channel Impedance Value: 399 Ohm
Lead Channel Impedance Value: 4047 Ohm
Lead Channel Impedance Value: 4047 Ohm
Lead Channel Impedance Value: 456 Ohm
Lead Channel Impedance Value: 456 Ohm
Lead Channel Pacing Threshold Amplitude: 0.625 V
Lead Channel Pacing Threshold Amplitude: 1.125 V
Lead Channel Pacing Threshold Amplitude: 2.125 V
Lead Channel Pacing Threshold Pulse Width: 0.4 ms
Lead Channel Pacing Threshold Pulse Width: 0.4 ms
Lead Channel Pacing Threshold Pulse Width: 1 ms
Lead Channel Sensing Intrinsic Amplitude: 3.25 mV
Lead Channel Sensing Intrinsic Amplitude: 31.625 mV
Lead Channel Setting Pacing Amplitude: 2.25 V
Lead Channel Setting Pacing Amplitude: 2.5 V
Lead Channel Setting Pacing Amplitude: 3 V
Lead Channel Setting Pacing Pulse Width: 0.4 ms
Lead Channel Setting Pacing Pulse Width: 1 ms
Lead Channel Setting Sensing Sensitivity: 0.6 mV

## 2018-11-03 NOTE — Progress Notes (Signed)
Remote ICD transmission.   

## 2018-11-17 ENCOUNTER — Other Ambulatory Visit: Payer: Self-pay | Admitting: Internal Medicine

## 2018-12-16 ENCOUNTER — Encounter (INDEPENDENT_AMBULATORY_CARE_PROVIDER_SITE_OTHER): Payer: Self-pay

## 2018-12-16 ENCOUNTER — Encounter: Payer: Self-pay | Admitting: Physician Assistant

## 2018-12-16 ENCOUNTER — Other Ambulatory Visit: Payer: Self-pay

## 2018-12-16 ENCOUNTER — Ambulatory Visit (INDEPENDENT_AMBULATORY_CARE_PROVIDER_SITE_OTHER): Payer: BC Managed Care – PPO | Admitting: Physician Assistant

## 2018-12-16 VITALS — BP 141/91 | HR 80 | Temp 97.0°F | Ht 73.0 in | Wt 217.6 lb

## 2018-12-16 DIAGNOSIS — I10 Essential (primary) hypertension: Secondary | ICD-10-CM

## 2018-12-16 DIAGNOSIS — Z95810 Presence of automatic (implantable) cardiac defibrillator: Secondary | ICD-10-CM

## 2018-12-16 DIAGNOSIS — I25810 Atherosclerosis of coronary artery bypass graft(s) without angina pectoris: Secondary | ICD-10-CM | POA: Diagnosis not present

## 2018-12-16 DIAGNOSIS — I5022 Chronic systolic (congestive) heart failure: Secondary | ICD-10-CM

## 2018-12-16 DIAGNOSIS — E785 Hyperlipidemia, unspecified: Secondary | ICD-10-CM

## 2018-12-16 DIAGNOSIS — K219 Gastro-esophageal reflux disease without esophagitis: Secondary | ICD-10-CM

## 2018-12-16 MED ORDER — ESOMEPRAZOLE MAGNESIUM 20 MG PO CPDR: 20.0000 mg | DELAYED_RELEASE_CAPSULE | Freq: Every day | ORAL | 0 refills | Status: DC

## 2018-12-16 NOTE — Patient Instructions (Signed)
Medication Instructions:   START Nexium 20 mg daily  *If you need a refill on your cardiac medications before your next appointment, please call your pharmacy*  Lab Work: Your physician recommends that you return for lab work in 1 week:  Fasting lipid Panel DO NOT EAT OR DRINK PAST MIDNIGHT. (WATER OK TO DRINK)  Liver Function Test  If you have labs (blood work) drawn today and your tests are completely normal, you will receive your results only by: Marland Kitchen MyChart Message (if you have MyChart) OR . A paper copy in the mail If you have any lab test that is abnormal or we need to change your treatment, we will call you to review the results.  Testing/Procedures: NONE ordered at this time of appointment   Follow-Up: At [facility], you and your health needs are our priority.  As part of our continuing mission to provide you with exceptional heart care, we have created designated Provider Care Teams.  These Care Teams include your primary Cardiologist (physician) and Advanced Practice Providers (APPs -  Physician Assistants and Nurse Practitioners) who all work together to provide you with the care you need, when you need it.  Your next appointment:   6 month(s)  The format for your next appointment:   Either In Person or Virtual  Provider:   Peter Martinique, MD  Other Instructions

## 2018-12-16 NOTE — Progress Notes (Signed)
Cardiology Office Note:    Date:  12/18/2018   ID:  [name] MarinJames F Flom, DOB [birth year], MRN [id number]  PCP:  Jarome MatinPaterson, Daniel, MD  Cardiologist:  Peter SwazilandJordan, MD  Electrophysiologist:  None   Referring MD: Jarome MatinPaterson, Daniel, MD   Chief Complaint  Patient presents with  . Follow-up    seen for Dr. SwazilandJordan.    History of Present Illness:    [name] is a 63 y.o. male with a hx of CAD s/p CABG, hypertension, left bundle branch block, hyperlipidemia, and chronic systolic heart failure.  Patient was admitted in June 2019 with flash pulmonary edema.  He was ruled in for NSTEMI and underwent cardiac catheterization which showed severe three-vessel CAD.  He had a syncope in June 2019 while waiting in the neurology office due to urethral stricture.  He was noted to have wide-complex tachycardia on telemetry with heart rate of 170.  EF was 35 to 40%.  Lower extremity Doppler was negative for DVT.  CT of the chest was negative for PE.  He was seen by EP, the underlying rhythm could be either VT versus NSVT.  He was fitted with LifeVest.  Medication was cut back due to mild hypotension and orthostasis.  Repeat echocardiogram in September 2019 showed persistently low EF of 25 to 30%.  He was unable to tolerate Entresto, spironolactone and carvedilol due to orthostatic symptom and the lightheadedness.  Patient eventually underwent BIV ICD implantation in March 2020 by Dr. Graciela HusbandsKlein after failing another trial of medical therapy.  Since then, repeat echocardiogram in July 2020 did show significant improvement in EF to 40 to 45%.  Patient presents today for cardiology office visit.  He continues to be quite active and riding stationary bicycle for up to 30 minutes a day without any exertional chest pain or shortness of breath.  He has no lower extremity edema, orthopnea or PND.  He does continue to complain of sternotomy soreness that is worse when he lay down or roll over.  Otherwise chest discomfort does not  occur with physical activity.  This is clearly musculoskeletal issue.  He does complain of some acid reflux issue, I will start him on Nexium 20 mg daily.  He is overdue for fasting lipid panel and LFT on the Repatha.  He can follow-up with Dr. SwazilandJordan in 6 months.   Past Medical History:  Diagnosis Date  . Arthritis    "mild; back, neck, spine" (06/09/2017)  . CHF (congestive heart failure) (HCC)   . Coronary artery disease   . History of gout   . History of kidney stones X 2  . Hypercholesterolemia   . Hypertension   . LBBB (left bundle branch block)   . MI (myocardial infarction) (HCC) 1993   LATERAL  . MI (myocardial infarction) (HCC) ?09/2001; 06/07/2017  . Pneumonia    "several times" (06/09/2017)  . S/P coronary artery stent placement    RCA  . Varicose veins     Past Surgical History:  Procedure Laterality Date  . ANTERIOR CERVICAL DECOMP/DISCECTOMY FUSION  03/03/2011   Procedure: ANTERIOR CERVICAL DECOMPRESSION/DISCECTOMY FUSION 3 LEVELS;  Surgeon: Karn CassisErnesto M Botero, MD;  Location: [facility];  Service: Neurosurgery;  Laterality: N/A;  Cervical three-four Cervical four-five Cervical five-six Anterior cervical decompression/diskectomy, fusion  . BACK SURGERY    . CARDIAC CATHETERIZATION  2009   Stents in RCA patent. 70 to 80% PL, and 50% ostial PD.   Marland Kitchen. CARDIAC CATHETERIZATION N/A 11/20/2014   Procedure:  Left Heart Cath and Coronary Angiography;  Surgeon: Peter M Swaziland, MD;  Location: [facility] INVASIVE CV LAB;  Service: Cardiovascular;  Laterality: N/A;  . CORONARY ANGIOPLASTY     DIRECT ANGIOPLASTY THE MARGINAL BRANCH  . CORONARY ANGIOPLASTY WITH STENT PLACEMENT     RCA  . CORONARY ARTERY BYPASS GRAFT N/A 06/14/2017   Procedure: CORONARY ARTERY BYPASS GRAFTING (CABG) x four, using left internal mammary artery and right leg greater saphenous vein harvested endoscopically;  Surgeon: Kerin Perna, MD;  Location: [facility] OR;  Service: Open Heart Surgery;  Laterality: N/A;  . CYSTOSCOPY  N/A 06/14/2017   Procedure: CYSTOSCOPY;  Surgeon: Donata Clay, Theron Arista, MD;  Location: [facility] OR;  Service: Open Heart Surgery;  Laterality: N/A;  . ICD IMPLANT N/A 03/11/2018   Procedure: ICD IMPLANT - Dual Chamber;  Surgeon: [name], MD;  Location: [facility] INVASIVE CV LAB;  Service: Cardiovascular;  Laterality: N/A;  . INSERTION OF SUPRAPUBIC CATHETER N/A 06/14/2017   Procedure: INSERTION OF SUPRAPUBIC CATHETER - Lower abdomen;  Surgeon: Kerin Perna, MD;  Location: [facility] OR;  Service: Open Heart Surgery;  Laterality: N/A;  . POSTERIOR LUMBAR FUSION  10/2011  . RIGHT/LEFT HEART CATH AND CORONARY ANGIOGRAPHY N/A 06/08/2017   Procedure: RIGHT/LEFT HEART CATH AND CORONARY ANGIOGRAPHY;  Surgeon: Swaziland, Peter M, MD;  Location: [facility] INVASIVE CV LAB;  Service: Cardiovascular;  Laterality: N/A;  . TEE WITHOUT CARDIOVERSION N/A 06/14/2017   Procedure: TRANSESOPHAGEAL ECHOCARDIOGRAM (TEE);  Surgeon: Donata Clay, Theron Arista, MD;  Location: [name] OR;  Service: Open Heart Surgery;  Laterality: N/A;  . URETHROPLASTY N/A 10/15/2017   Procedure: URETHROPLASTY WITH BUCCAL GRAFT HARVAST;  Surgeon: Crist Fat, MD;  Location: [facility] ORS;  Service: Urology;  Laterality: N/A;    Current Medications: No outpatient medications have been marked as taking for the 12/16/18 encounter (Office Visit) with Azalee Course, PA.     Allergies:   Ezetimibe, Niacin, and Statins   Social History   Socioeconomic History  . Marital status: Married    Spouse name: Not on file  . Number of children: 1  . Years of education: Not on file  . Highest education level: Not on file  Occupational History  . Occupation: Truck Airline pilot  Tobacco Use  . Smoking status: Former Smoker    Packs/day: 2.00    Years: 20.00    Pack years: 40.00    Types: Cigarettes    Quit date: 05/07/1999    Years since quitting: 19.6  . Smokeless tobacco: Never Used  Substance and Sexual Activity  . Alcohol use: Yes    Comment: 06/09/2017 "1 beer/month; at most"  . Drug  use: Never  . Sexual activity: Not on file  Other Topics Concern  . Not on file  Social History Narrative  . Not on file   Social Determinants of Health   Financial Resource Strain:   . Difficulty of Paying Living Expenses: Not on file  Food Insecurity:   . Worried About Programme researcher, broadcasting/film/video in the Last Year: Not on file  . Ran Out of Food in the Last Year: Not on file  Transportation Needs:   . Lack of Transportation (Medical): Not on file  . Lack of Transportation (Non-Medical): Not on file  Physical Activity:   . Days of Exercise per Week: Not on file  . Minutes of Exercise per Session: Not on file  Stress:   . Feeling of Stress : Not on file  Social Connections:   .  Frequency of Communication with Friends and Family: Not on file  . Frequency of Social Gatherings with Friends and Family: Not on file  . Attends Religious Services: Not on file  . Active Member of Clubs or Organizations: Not on file  . Attends Archivist Meetings: Not on file  . Marital Status: Not on file     Family History: The patient's family history includes Breast cancer in his mother; Coronary artery disease in his mother; Diabetes in his father; Heart attack in his father and maternal grandfather; Heart disease in his brother.  ROS:   Please see the history of present illness.     All other systems reviewed and are negative.  EKGs/Labs/Other Studies Reviewed:    The following studies were reviewed today:  Echo 07/11/2018 1. The left ventricle has mild-moderately reduced systolic function, with an ejection fraction of 40-45%. The cavity size was normal. Left ventricular diastolic Doppler parameters are indeterminate. Indeterminate filling pressures.  2. The right ventricle has normal systolic function. The cavity was normal. There is no increase in right ventricular wall thickness. Right ventricular systolic pressure could not be assessed.  3. Left atrial size was mildly dilated.  4. Aortic  valve regurgitation is mild to moderate by color flow Doppler. No stenosis of the aortic valve.  5. The Ascending aorta measures 39 mm, within normal limits when indexed to body surface area.  6. When compared to the prior study: 01/21/2018- LV ejection fraction has improved. Ventricular septal contraction is less dysynchronous. Aortic valve regurgitation has not significantly changed. Side by side comparison of images performed.   EKG:  EKG is ordered today.  The ekg ordered today demonstrates atrial sensed ventricularly paced rhythm  Recent Labs: 03/04/2018: BUN 24; Creatinine, Ser 1.06; Hemoglobin 13.9; Platelets 314; Potassium 4.7; Sodium 138  Recent Lipid Panel    Component Value Date/Time   CHOL 109 10/29/2017 0939   CHOL 97 (L) 08/07/2016 1123   TRIG 135 10/29/2017 0939   HDL 42 10/29/2017 0939   HDL 50 08/07/2016 1123   CHOLHDL 2.6 10/29/2017 0939   VLDL 27 10/29/2017 0939   LDLCALC 40 10/29/2017 0939   LDLCALC 18 08/07/2016 1123    Physical Exam:    VS:  BP (!) 141/91   Pulse 80   Temp (!) 97 F (36.1 C)   Ht 6\' 1"  (1.854 m)   Wt 217 lb 9.6 oz (98.7 kg)   BMI 28.71 kg/m     Wt Readings from Last 3 Encounters:  12/16/18 217 lb 9.6 oz (98.7 kg)  08/12/18 225 lb (102.1 kg)  07/18/18 225 lb (102.1 kg)     GEN:  Well nourished, well developed in no acute distress HEENT: Normal NECK: No JVD; No carotid bruits LYMPHATICS: No lymphadenopathy CARDIAC: RRR, no murmurs, rubs, gallops RESPIRATORY:  Clear to auscultation without rales, wheezing or rhonchi  ABDOMEN: Soft, non-tender, non-distended MUSCULOSKELETAL:  No edema; No deformity  SKIN: Warm and dry NEUROLOGIC:  Alert and oriented x 3 PSYCHIATRIC:  Normal affect   ASSESSMENT:    1. Coronary artery disease involving coronary bypass graft of native heart without angina pectoris   2. Hyperlipidemia, unspecified hyperlipidemia type   3. Essential hypertension   4. Chronic systolic heart failure ([place])   5.  Biventricular implantable cardioverter-defibrillator in situ   6. Gastroesophageal reflux disease without esophagitis    PLAN:    In order of problems listed above:  1. CAD s/p CABG: Denies any new obvious anginal  symptom.  Aspirin and carvedilol.  He has chronic sternotomy soreness, however this is clearly musculoskeletal  2. Hyperlipidemia: On Repatha.  Obtain fasting lipid panel and LFT  3. Hypertension: Blood pressure borderline elevated during today's visit however normally it is well controlled.  4. Chronic systolic heart failure: Euvolemic on today's exam.  On appropriate heart failure regimen include carvedilol and Entresto.  5. Status post biventricular ICD: Followed by EP.  Although he has prior history of either VT versus NSVT, recent device interrogation did not mention any ventricular tachycardia  6. GERD: Prescribed Nexium.   Medication Adjustments/Labs and Tests Ordered: Current medicines are reviewed at length with the patient today.  Concerns regarding medicines are outlined above.  Orders Placed This Encounter  Procedures  . Hepatic function panel  . Lipid panel  . EKG 12-Lead   Meds ordered this encounter  Medications  . esomeprazole (NEXIUM 24HR) 20 MG capsule    Sig: Take 1 capsule (20 mg total) by mouth daily.    Dispense:  90 capsule    Refill:  0    Patient Instructions  Medication Instructions:   START Nexium 20 mg daily  *If you need a refill on your cardiac medications before your next appointment, please call your pharmacy*  Lab Work: Your physician recommends that you return for lab work in 1 week:  Fasting lipid Panel DO NOT EAT OR DRINK PAST MIDNIGHT. (WATER OK TO DRINK)  Liver Function Test  If you have labs (blood work) drawn today and your tests are completely normal, you will receive your results only by: Marland Kitchen MyChart Message (if you have MyChart) OR . A paper copy in the mail If you have any lab test that is abnormal or we need to  change your treatment, we will call you to review the results.  Testing/Procedures: NONE ordered at this time of appointment   Follow-Up: At [facility], you and your health needs are our priority.  As part of our continuing mission to provide you with exceptional heart care, we have created designated Provider Care Teams.  These Care Teams include your primary Cardiologist (physician) and Advanced Practice Providers (APPs -  Physician Assistants and Nurse Practitioners) who all work together to provide you with the care you need, when you need it.  Your next appointment:   6 month(s)  The format for your next appointment:   Either In Person or Virtual  Provider:   Peter Swaziland, MD  Other Instructions      Signed, Azalee Course, PA  12/18/2018 11:33 PM    [facility]

## 2018-12-18 ENCOUNTER — Encounter: Payer: Self-pay | Admitting: Physician Assistant

## 2019-01-02 ENCOUNTER — Other Ambulatory Visit (HOSPITAL_COMMUNITY): Payer: Self-pay | Admitting: Cardiology

## 2019-01-02 ENCOUNTER — Other Ambulatory Visit: Payer: Self-pay | Admitting: Cardiology

## 2019-01-02 DIAGNOSIS — E785 Hyperlipidemia, unspecified: Secondary | ICD-10-CM | POA: Diagnosis not present

## 2019-01-02 LAB — LIPID PANEL
Chol/HDL Ratio: 2.8 ratio (ref 0.0–5.0)
Cholesterol, Total: 120 mg/dL (ref 100–199)
HDL: 43 mg/dL (ref >39)
LDL Chol Calc (NIH): 40 mg/dL (ref 0–99)
Triglycerides: 236 mg/dL — ABNORMAL HIGH (ref 0–149)
VLDL Cholesterol Cal: 37 mg/dL (ref 5–40)

## 2019-01-02 LAB — HEPATIC FUNCTION PANEL
ALT: 20 IU/L (ref 0–44)
AST: 20 IU/L (ref 0–40)
Albumin: 4.5 g/dL (ref 3.8–4.8)
Alkaline Phosphatase: 66 IU/L (ref 39–117)
Bilirubin Total: 0.8 mg/dL (ref 0.0–1.2)
Bilirubin, Direct: 0.19 mg/dL (ref 0.00–0.40)
Total Protein: 6.7 g/dL (ref 6.0–8.5)

## 2019-01-03 NOTE — Progress Notes (Signed)
Please double check to see if he is still on Repatha. Triglyceride worsened, however total cholesterol, good cholesterol and bad cholesterol all well controlled. Liver function normal.

## 2019-01-04 DIAGNOSIS — Z20828 Contact with and (suspected) exposure to other viral communicable diseases: Secondary | ICD-10-CM | POA: Diagnosis not present

## 2019-01-05 NOTE — Progress Notes (Signed)
Doubt flu like symptom can affect triglyceride. It is strange how the triglyceride suddenly increased. I do not recommend medication change, simply recheck FLP in 6 month

## 2019-01-05 NOTE — Telephone Encounter (Signed)
OK to refill Ambien  Jalissa Heinzelman Martin[place] MD, [facility]

## 2019-01-10 ENCOUNTER — Other Ambulatory Visit: Payer: Self-pay

## 2019-01-10 ENCOUNTER — Other Ambulatory Visit: Payer: Self-pay | Admitting: Cardiology

## 2019-01-10 DIAGNOSIS — E785 Hyperlipidemia, unspecified: Secondary | ICD-10-CM

## 2019-01-10 NOTE — Telephone Encounter (Signed)
OK to refill ambien. He can have several refills  Seham Gardenhire Swaziland MD, [facility]

## 2019-01-19 ENCOUNTER — Ambulatory Visit (INDEPENDENT_AMBULATORY_CARE_PROVIDER_SITE_OTHER): Payer: BC Managed Care – PPO | Admitting: *Deleted

## 2019-01-19 DIAGNOSIS — I5022 Chronic systolic (congestive) heart failure: Secondary | ICD-10-CM | POA: Diagnosis not present

## 2019-01-20 LAB — CUP PACEART REMOTE DEVICE CHECK
Battery Remaining Longevity: 45 mo
Battery Voltage: 2.97 V
Brady Statistic AP VP Percent: 0.01 %
Brady Statistic AP VS Percent: 0 %
Brady Statistic AS VP Percent: 99.94 %
Brady Statistic AS VS Percent: 0.05 %
Brady Statistic RA Percent Paced: 0.01 %
Brady Statistic RV Percent Paced: 99.9 %
Date Time Interrogation Session: 20210114192303
HighPow Impedance: 78 Ohm
Implantable Lead Implant Date: 20190610
Implantable Lead Implant Date: 20200306
Implantable Lead Implant Date: 20200306
Implantable Lead Location: 753858
Implantable Lead Location: 753859
Implantable Lead Location: 753860
Implantable Lead Model: 5071
Implantable Lead Model: 5076
Implantable Pulse Generator Implant Date: 20200306
Lead Channel Impedance Value: 323 Ohm
Lead Channel Impedance Value: 399 Ohm
Lead Channel Impedance Value: 4047 Ohm
Lead Channel Impedance Value: 4047 Ohm
Lead Channel Impedance Value: 456 Ohm
Lead Channel Impedance Value: 494 Ohm
Lead Channel Pacing Threshold Amplitude: 0.625 V
Lead Channel Pacing Threshold Amplitude: 1 V
Lead Channel Pacing Threshold Amplitude: 2 V
Lead Channel Pacing Threshold Pulse Width: 0.4 ms
Lead Channel Pacing Threshold Pulse Width: 0.4 ms
Lead Channel Pacing Threshold Pulse Width: 1 ms
Lead Channel Sensing Intrinsic Amplitude: 3.25 mV
Lead Channel Sensing Intrinsic Amplitude: 3.25 mV
Lead Channel Sensing Intrinsic Amplitude: 31.625 mV
Lead Channel Sensing Intrinsic Amplitude: 31.625 mV
Lead Channel Setting Pacing Amplitude: 2 V
Lead Channel Setting Pacing Amplitude: 2.5 V
Lead Channel Setting Pacing Amplitude: 3 V
Lead Channel Setting Pacing Pulse Width: 0.4 ms
Lead Channel Setting Pacing Pulse Width: 1 ms
Lead Channel Setting Sensing Sensitivity: 0.6 mV

## 2019-02-16 ENCOUNTER — Ambulatory Visit (INDEPENDENT_AMBULATORY_CARE_PROVIDER_SITE_OTHER): Payer: BC Managed Care – PPO

## 2019-02-16 ENCOUNTER — Ambulatory Visit (INDEPENDENT_AMBULATORY_CARE_PROVIDER_SITE_OTHER): Payer: BC Managed Care – PPO | Admitting: Orthopedic Surgery

## 2019-02-16 ENCOUNTER — Other Ambulatory Visit: Payer: Self-pay

## 2019-02-16 ENCOUNTER — Encounter: Payer: Self-pay | Admitting: Orthopedic Surgery

## 2019-02-16 VITALS — Ht 73.0 in | Wt 217.0 lb

## 2019-02-16 DIAGNOSIS — M25559 Pain in unspecified hip: Secondary | ICD-10-CM

## 2019-02-16 DIAGNOSIS — M25561 Pain in right knee: Secondary | ICD-10-CM

## 2019-02-16 DIAGNOSIS — M25551 Pain in right hip: Secondary | ICD-10-CM | POA: Diagnosis not present

## 2019-02-16 DIAGNOSIS — M5416 Radiculopathy, lumbar region: Secondary | ICD-10-CM | POA: Diagnosis not present

## 2019-02-16 MED ORDER — PREDNISONE 10 MG PO TABS: 20.0000 mg | ORAL_TABLET | Freq: Every day | ORAL | 0 refills | Status: DC

## 2019-02-16 NOTE — Progress Notes (Signed)
Office Visit Note   Patient: [name]           Date of Birth: Apr 22, 1955           MRN: [id number] Visit Date: 02/16/2019              Requested by: Leanna Battles, [place],  [place] [address] PCP: Leanna Battles, MD  Chief Complaint  Patient presents with  . Right Knee - Pain  . Right Hip - Pain      HPI: Patient is a 64 year old gentleman who presents for evaluation complaining of lateral right hip pain and lateral right knee pain.  Patient states he has had some chronic right buttocks pain.  He states the pain down the right side is worse with sitting and driving.  Patient states that he has had a lumbar spine fusion about 8 to 9 years ago and did have epidural steroid injections prior to the fusion.  Assessment & Plan: Visit Diagnoses:  1. Right knee pain, unspecified chronicity   2. Hip pain   3. Lumbar back pain with radiculopathy affecting right lower extremity     Plan: We will start him on a low-dose prednisone to see if this will help the radicular symptoms.  Discussed that if he is still symptomatic at follow-up we would obtain an MRI scan of the lumbar spine for possible epidural steroid injection.  Follow-Up Instructions: Return in about 3 weeks (around 03/09/2019).   Ortho Exam  Patient is alert, oriented, no adenopathy, well-dressed, normal affect, normal respiratory effort. Examination patient has a normal gait he does not have an abductor lurch.  He has a positive straight leg raise on the right and has weakness with hip flexion on the right.  He has tenderness to palpation over the lateral aspect of the knee but not specifically over the lateral joint line.  He is tender over the greater trochanter on the right as well.  Patient has no pain with range of motion of the right hip knee or ankle.  Imaging: XR HIP UNILAT W OR W/O PELVIS 2-3 VIEWS RIGHT  Result Date: 02/16/2019 2 view radiographs of the right hip shows previous internal  fixation L5 L4 with broken hardware.  The joint spaces of the hips are congruent there is calcification of the femoral vessels bilaterally.  XR Knee 1-2 Views Right  Result Date: 02/16/2019 2 view radiographs of the right knee some calcification in the popliteal fossa osteophytic bone spurs in the patellofemoral joint with medial joint line narrowing of the right knee greater than the left.  No images are attached to the encounter.  Labs: Lab Results  Component Value Date   HGBA1C 5.9 (H) 06/10/2017   HGBA1C 6.3 (H) 11/19/2014   LABURIC 5.5 08/30/2014   REPTSTATUS 10/13/2017 FINAL 10/11/2017   CULT >=100,000 COLONIES/mL CITROBACTER FREUNDII (A) 10/11/2017   LABORGA CITROBACTER FREUNDII (A) 10/11/2017     Lab Results  Component Value Date   ALBUMIN 4.5 01/02/2019   ALBUMIN 4.0 10/11/2017   ALBUMIN 2.7 (L) 06/22/2017   LABURIC 5.5 08/30/2014    Lab Results  Component Value Date   MG 2.3 06/24/2017   MG 2.2 06/23/2017   MG 2.3 06/15/2017   No results found for: VD25OH  No results found for: PREALBUMIN CBC EXTENDED Latest Ref Rng & Units 03/04/2018 10/15/2017 10/11/2017  WBC 3.4 - 10.8 x10E3/uL 6.8 9.7 5.3  RBC 4.14 - 5.80 x10E6/uL 4.69 4.90 5.02  HGB 13.0 -  17.7 g/dL 95.0 93.2 67.1  HCT 24.5 - 51.0 % 43.0 43.2 43.5  PLT 150 - 450 x10E3/uL 314 264 279  NEUTROABS 1.7 - 7.7 K/uL - - -  LYMPHSABS 0.7 - 4.0 K/uL - - -     Body mass index is 28.63 kg/m.  Orders:  Orders Placed This Encounter  Procedures  . XR HIP UNILAT W OR W/O PELVIS 2-3 VIEWS RIGHT  . XR Knee 1-2 Views Right   Meds ordered this encounter  Medications  . predniSONE (DELTASONE) 10 MG tablet    Sig: Take 2 tablets (20 mg total) by mouth daily with breakfast.    Dispense:  60 tablet    Refill:  0     Procedures: No procedures performed  Clinical Data: No additional findings.  ROS:  All other systems negative, except as noted in the HPI. Review of Systems  Objective: Vital Signs: Ht 6'  1" (1.854 m)   Wt 217 lb (98.4 kg)   BMI 28.63 kg/m   Specialty Comments:  No specialty comments available.  PMFS History: Patient Active Problem List   Diagnosis Date Noted  . Bulbous urethral stricture 10/15/2017  . Personal history of urethral stricture 08/10/2017  . Chronic systolic heart failure (HCC) 06/22/2017  . S/P CABG x 4 06/14/2017  . ACS (acute coronary syndrome) (HCC) 06/08/2017  . Pain in the chest   . Gout 08/30/2014  . Neck pain, chronic 12/17/2010  . Back pain, chronic 12/01/2010  . Pericarditis 10/02/2010  . CAD (coronary artery disease) 05/14/2010  . Hypertension   . Hypercholesterolemia   . LBBB (left bundle branch block)   . NSTEMI (non-ST elevated myocardial infarction) [facility])    Past Medical History:  Diagnosis Date  . Arthritis    "mild; back, neck, spine" (06/09/2017)  . CHF (congestive heart failure) (HCC)   . Coronary artery disease   . History of gout   . History of kidney stones X 2  . Hypercholesterolemia   . Hypertension   . LBBB (left bundle branch block)   . MI (myocardial infarction) (HCC) 1993   LATERAL  . MI (myocardial infarction) (HCC) ?09/2001; 06/07/2017  . Pneumonia    "several times" (06/09/2017)  . S/P coronary artery stent placement    RCA  . Varicose veins     Family History  Problem Relation Age of Onset  . Heart attack Father   . Diabetes Father   . Breast cancer Mother   . Coronary artery disease Mother   . Heart disease Brother   . Heart attack Maternal Grandfather     Past Surgical History:  Procedure Laterality Date  . ANTERIOR CERVICAL DECOMP/DISCECTOMY FUSION  03/03/2011   Procedure: ANTERIOR CERVICAL DECOMPRESSION/DISCECTOMY FUSION 3 LEVELS;  Surgeon: Karn Cassis, MD;  Location: [facility] ORS;  Service: Neurosurgery;  Laterality: N/A;  Cervical three-four Cervical four-five Cervical five-six Anterior cervical decompression/diskectomy, fusion  . BACK SURGERY    . CARDIAC CATHETERIZATION  2009   Stents in  RCA patent. 70 to 80% PL, and 50% ostial PD.   Marland Kitchen CARDIAC CATHETERIZATION N/A 11/20/2014   Procedure: Left Heart Cath and Coronary Angiography;  Surgeon: Peter M Swaziland, MD;  Location: [facility] INVASIVE CV LAB;  Service: Cardiovascular;  Laterality: N/A;  . CORONARY ANGIOPLASTY     DIRECT ANGIOPLASTY THE MARGINAL BRANCH  . CORONARY ANGIOPLASTY WITH STENT PLACEMENT     RCA  . CORONARY ARTERY BYPASS GRAFT N/A 06/14/2017   Procedure: CORONARY ARTERY BYPASS  GRAFTING (CABG) x four, using left internal mammary artery and right leg greater saphenous vein harvested endoscopically;  Surgeon: Kerin Perna, MD;  Location: [facility] OR;  Service: Open Heart Surgery;  Laterality: N/A;  . CYSTOSCOPY N/A 06/14/2017   Procedure: CYSTOSCOPY;  Surgeon: Donata Clay, Theron Arista, MD;  Location: [facility] OR;  Service: Open Heart Surgery;  Laterality: N/A;  . ICD IMPLANT N/A 03/11/2018   Procedure: ICD IMPLANT - Dual Chamber;  Surgeon: [name], MD;  Location: [facility] INVASIVE CV LAB;  Service: Cardiovascular;  Laterality: N/A;  . INSERTION OF SUPRAPUBIC CATHETER N/A 06/14/2017   Procedure: INSERTION OF SUPRAPUBIC CATHETER - Lower abdomen;  Surgeon: Kerin Perna, MD;  Location: [facility] OR;  Service: Open Heart Surgery;  Laterality: N/A;  . POSTERIOR LUMBAR FUSION  10/2011  . RIGHT/LEFT HEART CATH AND CORONARY ANGIOGRAPHY N/A 06/08/2017   Procedure: RIGHT/LEFT HEART CATH AND CORONARY ANGIOGRAPHY;  Surgeon: Swaziland, Peter M, MD;  Location: [facility] INVASIVE CV LAB;  Service: Cardiovascular;  Laterality: N/A;  . TEE WITHOUT CARDIOVERSION N/A 06/14/2017   Procedure: TRANSESOPHAGEAL ECHOCARDIOGRAM (TEE);  Surgeon: Donata Clay, Theron Arista, MD;  Location: [facility] OR;  Service: Open Heart Surgery;  Laterality: N/A;  . URETHROPLASTY N/A 10/15/2017   Procedure: URETHROPLASTY WITH BUCCAL GRAFT HARVAST;  Surgeon: Crist Fat, MD;  Location: [facility] ORS;  Service: Urology;  Laterality: N/A;   Social History   Occupational History  . Occupation: Truck Airline pilot  Tobacco Use    . Smoking status: Former Smoker    Packs/day: 2.00    Years: 20.00    Pack years: 40.00    Types: Cigarettes    Quit date: 05/07/1999    Years since quitting: 19.7  . Smokeless tobacco: Never Used  Substance and Sexual Activity  . Alcohol use: Yes    Comment: 06/09/2017 "1 beer/month; at most"  . Drug use: Never  . Sexual activity: Not on file

## 2019-02-24 ENCOUNTER — Ambulatory Visit (INDEPENDENT_AMBULATORY_CARE_PROVIDER_SITE_OTHER): Payer: BC Managed Care – PPO | Admitting: Family Medicine

## 2019-02-24 ENCOUNTER — Other Ambulatory Visit: Payer: Self-pay

## 2019-02-24 DIAGNOSIS — M25559 Pain in unspecified hip: Secondary | ICD-10-CM

## 2019-02-24 DIAGNOSIS — M25561 Pain in right knee: Secondary | ICD-10-CM | POA: Diagnosis not present

## 2019-02-24 DIAGNOSIS — M25569 Pain in unspecified knee: Secondary | ICD-10-CM | POA: Insufficient documentation

## 2019-02-24 MED ORDER — METHYLPREDNISOLONE ACETATE 40 MG/ML IJ SUSP
40.0000 mg | Freq: Once | INTRAMUSCULAR | Status: AC
  Administered 2019-02-24: 13:00:00 40 mg via INTRA_ARTICULAR

## 2019-02-24 NOTE — Assessment & Plan Note (Signed)
Patient with right hip pain.  Consistent with greater trochanter bursitis.  Discussed doing corticosteroid injection.  Patient was agreeable to this.  Procedure performed: hip intraarticular corticosteroid injection; ultrasound guided, in-plane approach  Consent obtained and verified. Time-out conducted. Noted no overlying erythema, induration, or other signs of local infection. The right Hip Joint was identified with ultrasound using. The overlying skin was prepped in a sterile fashion. Topical analgesic spray: Ethyl chloride. Joint: right hip joint Needle: 21 gauge, 1.5 inch Completed without difficulty. Meds: 1:4 depomedrol to lidocaine   Advised to call if fevers/chills, erythema, induration, drainage, or persistent bleeding. Entire procedure supervised by Dr. Jennette Kettle

## 2019-02-24 NOTE — Patient Instructions (Signed)
Thank you for letting us participate in your care.   Today we did a steroid injection of your hip and knee  For your hip and knee pain 1. Try and use ice as you can 2. Follow up in 2-3 weeks with Dr. Jennette Kettle

## 2019-02-24 NOTE — Assessment & Plan Note (Signed)
Patient with continued knee pain.  Concern for osteoarthritis versus possible meniscus injury.  Recent x-ray imaging.  Unclear as prepatellar osteoarthritis.  Patient advised to have corticosteroid injection.  Procedure performed: knee intraarticular corticosteroid injection; palpation guided  Consent obtained and verified. Time-out conducted. Noted no overlying erythema, induration, or other signs of local infection. The right medial joint space was palpated and marked. The overlying skin was prepped in a sterile fashion. Topical analgesic spray: Ethyl chloride. Joint: right knee Needle: 21 gauge, 1.5 inch Completed without difficulty. Meds:1 mL depomedrol, 4 ml 1% lidocaine without epinephrine  Advised to call if fevers/chills, erythema, induration, drainage, or persistent bleeding.  Consent obtained and verified. Time-out conducted. Entire procedure supervised by Dr. Jennette Kettle

## 2019-02-24 NOTE — Progress Notes (Signed)
[name] is a 64 y.o. male who presents to [facility] today for the following:  Right knee/hip/low back pain Patient presenting with right knee, hip, low back pain.  Pain began 1 year ago and has been progressively worsening. States that he can hardly walk 2/2 pain. States that he sometimes gets medial foot pain after walking as well. Recently saw Dr. Lajoyce Corners who put him on prednisone. Prednisone did not help. Has been using frozen water bottles with minimal relief. States he has tried all over-the-counter medications including Tylenol, ibuprofen, Voltaren gel, CBD oil, acupuncture, chiropractor, knee brace.  None of these have helped.  States he is still able to ambulate without the use of a cane or a walker but it is with pain.  Pain is worse with any positional changes.  States that his knee does feel like it gives out sometimes and has given out totally 2 times.  Does notice swelling in the lateral side of the knee.   PMH reviewed. ACS, CAD, HTN, LBBB, h/o NSTEMI  ROS as above. Medications reviewed.  Exam:  BP 140/88   Ht 6\' 1"  (1.854 m)   Wt 218 lb (98.9 kg)   BMI 28.76 kg/m  Gen: Well NAD MSK: Hip:  - Inspection: No gross deformity, no swelling, erythema, or ecchymosis - Palpation: TTP around greater trochanter bursa  - ROM: Normal range of motion on Flexion, extension, abduction, internal and external rotation - Strength: Normal strength. - Neuro/vasc: NV intact distally - Special Tests: Negative FABER and FADIR.  Negative Scour.     Knee: - Inspection: no gross deformity. Effusion around supero-lateral knee, erythema or bruising. Skin intact - Palpation: TTP around lateral joint line - ROM: full active ROM with flexion and extension in knee and hip - Strength: 5/5 strength - Neuro/vasc: NV intact - Special Tests: - LIGAMENTS: negative anterior and posterior drawer, negative Lachman's, no MCL or LCL laxity  -- MENISCUS: Pos McMurray's, Pos Thessaly  -- PF JOINT: nml patellar  mobility bilaterally.  negative patellar grind, negative patellar apprehension  Hips: normal ROM, negative FABER and FADIR bilaterally   XR HIP UNILAT W OR W/O PELVIS 2-3 VIEWS RIGHT  Result Date: 02/16/2019 2 view radiographs of the right hip shows previous internal fixation L5 L4 with broken hardware.  The joint spaces of the hips are congruent there is calcification of the femoral vessels bilaterally.  XR Knee 1-2 Views Right  Result Date: 02/16/2019 2 view radiographs of the right knee some calcification in the popliteal fossa osteophytic bone spurs in the patellofemoral joint with medial joint line narrowing of the right knee greater than the left.    Assessment and Plan: 1) Hip pain Patient with right hip pain.  Consistent with greater trochanter bursitis.  Discussed doing corticosteroid injection.  Patient was agreeable to this.  Procedure performed: hip intraarticular corticosteroid injection; ultrasound guided, in-plane approach  Consent obtained and verified. Time-out conducted. Noted no overlying erythema, induration, or other signs of local infection. The right Hip Joint was identified with ultrasound using. The overlying skin was prepped in a sterile fashion. Topical analgesic spray: Ethyl chloride. Joint: right hip joint Needle: 21 gauge, 1.5 inch Completed without difficulty. Meds: 1:4 depomedrol to lidocaine   Advised to call if fevers/chills, erythema, induration, drainage, or persistent bleeding. Entire procedure supervised by Dr. 04/16/2019  Knee pain Patient with continued knee pain.  Concern for osteoarthritis versus possible meniscus injury.  Recent x-ray imaging.  Unclear as prepatellar osteoarthritis.  Patient  advised to have corticosteroid injection.  Procedure performed: knee intraarticular corticosteroid injection; palpation guided  Consent obtained and verified. Time-out conducted. Noted no overlying erythema, induration, or other signs of local  infection. The right medial joint space was palpated and marked. The overlying skin was prepped in a sterile fashion. Topical analgesic spray: Ethyl chloride. Joint: right knee Needle: 21 gauge, 1.5 inch Completed without difficulty. Meds:1 mL depomedrol, 4 ml 1% lidocaine without epinephrine  Advised to call if fevers/chills, erythema, induration, drainage, or persistent bleeding.  Consent obtained and verified. Time-out conducted. Entire procedure supervised by Dr. Thayer Dallas, DO, PGY-3 [facility] 02/24/2019 12:47 PM

## 2019-02-24 NOTE — Progress Notes (Signed)
[facility] Attending Note: I have seen and examined this patient. I have discussed this patient with the resident and reviewed the assessment and plan as documented above. I agree with the resident's findings and plan. Right knee and hip pain.PF joint OA and greater trochanteric bursitis    I reviewed his x-rays which show pretty good space left in the tibiofemoral joint.  There are some question of significant arthritic change including osteophytes and irregularities seen in the lateral view of the right knee.  This concerns me for patellofemoral joint arthritis.  That would also be consistent with his symptoms.  We discussed today.  His gait reveals antalgic limp.  I suspect this is what is causing his hip pain and he is very tender over the greater trochanteric bursa.  We injected both areas today and will see him back in 2 weeks.  I am hopeful that the greater trochanteric bursa issue will be significantly better.  If he is not having any improvement from the knee joint injection, we probably need to look at the patellofemoral joint with either CT or MRI.  MRI would clearly be a better imaging but also much more expensive.  I discussed this with him in detail.  He is quite happy with the plan.

## 2019-03-10 ENCOUNTER — Ambulatory Visit: Payer: Managed Care, Other (non HMO) | Admitting: Physician Assistant

## 2019-03-17 ENCOUNTER — Other Ambulatory Visit: Payer: Self-pay

## 2019-03-17 ENCOUNTER — Encounter: Payer: Self-pay | Admitting: Family Medicine

## 2019-03-17 ENCOUNTER — Ambulatory Visit (INDEPENDENT_AMBULATORY_CARE_PROVIDER_SITE_OTHER): Payer: BC Managed Care – PPO | Admitting: Family Medicine

## 2019-03-17 ENCOUNTER — Other Ambulatory Visit: Payer: Self-pay | Admitting: Family Medicine

## 2019-03-17 VITALS — BP 120/74 | Ht 74.0 in | Wt 218.0 lb

## 2019-03-17 DIAGNOSIS — M25559 Pain in unspecified hip: Secondary | ICD-10-CM | POA: Diagnosis not present

## 2019-03-17 DIAGNOSIS — M533 Sacrococcygeal disorders, not elsewhere classified: Secondary | ICD-10-CM | POA: Diagnosis not present

## 2019-03-17 DIAGNOSIS — M25561 Pain in right knee: Secondary | ICD-10-CM | POA: Diagnosis not present

## 2019-03-17 NOTE — Progress Notes (Signed)
PCP: Jarome Matin, MD  Subjective:   HPI: Patient is a 64 y.o. male here for follow-up of right hip and knee pain as well as evaluation of low back pain.  Had a corticosteroid injection to his right knee for osteoarthritis knee.  Patient notes roughly 30% improvement in his knee pain.  Pain still along the medial joint line as well as superior lateral aspect knee.  Denies any numbness or tingling.  He had no bruising or swelling that preceded the pain.  Patient had x-rays showing arthritis at the medial compartment as well as behind the patella.  Patient is not yet ready to talk about knee replacement that knee.  Patient's right hip pain is improved significantly following injection into his greater trochanteric bursa.  Roughly 60% provement.  Patient is satisfied with results of the injection.  Still mild pain over the bursa.  Pain does not radiate.  He denies any numbness and tingling in this area.   Lastly patient notes pain in his right low back.  He does have history of spinal fusion L4-L5.  He has hardware that was seen on x-rays of his pelvis.  The hardware is broken on both sides.  Pain does not radiate down his leg.  He denies any injury or trauma.  Patient has not done anything for the pain other than taking a prednisone Dosepak.  Review of Systems: See HPI above.  Past Medical History:  Diagnosis Date  . Arthritis    "mild; back, neck, spine" (06/09/2017)  . CHF (congestive heart failure) (HCC)   . Coronary artery disease   . History of gout   . History of kidney stones X 2  . Hypercholesterolemia   . Hypertension   . LBBB (left bundle branch block)   . MI (myocardial infarction) (HCC) 1993   LATERAL  . MI (myocardial infarction) (HCC) ?09/2001; 06/07/2017  . Pneumonia    "several times" (06/09/2017)  . S/P coronary artery stent placement    RCA  . Varicose veins     Current Outpatient Medications on File Prior to Visit  Medication Sig Dispense Refill  . acetaminophen  (TYLENOL) 500 MG tablet Take 1,000 mg by mouth 2 (two) times daily.    . Ascorbic Acid (VITAMIN C) 1000 MG tablet Take 1,000 mg by mouth daily.    Marland Kitchen aspirin 81 MG chewable tablet Chew 81 mg by mouth daily.    . carvedilol (COREG) 6.25 MG tablet TAKE ONE AND A HALF TABLETS BY MOUTH 2 TIMES DAILY 180 tablet 3  . esomeprazole (NEXIUM 24HR) 20 MG capsule Take 1 capsule (20 mg total) by mouth daily. 90 capsule 0  . Evolocumab (REPATHA SURECLICK) 140 MG/ML SOAJ Inject 140 mg into the skin every 14 (fourteen) days. 6 pen 3  . fenofibrate (TRICOR) 145 MG tablet TAKE 1 TABLET BY MOUTH EVERY DAY 90 tablet 1  . Multiple Vitamins-Minerals (MENS MULTIVITAMIN PLUS PO) Take 1 tablet by mouth 2 (two) times daily.    Marland Kitchen OVER THE COUNTER MEDICATION Take 2 capsules by mouth at bedtime. Deep Sleep    . predniSONE (DELTASONE) 10 MG tablet Take 2 tablets (20 mg total) by mouth daily with breakfast. 60 tablet 0  . Probiotic Product (PROBIOTIC PO) Take 1 capsule by mouth 2 (two) times daily.    . sacubitril-valsartan (ENTRESTO) 24-26 MG Take 0.5 tablets by mouth 2 (two) times daily. 90 tablet 2  . zolpidem (AMBIEN) 10 MG tablet TAKE 1 TABLET BY MOUTH AT BEDTIME AS  NEEDED 30 tablet 3   No current facility-administered medications on file prior to visit.    Past Surgical History:  Procedure Laterality Date  . ANTERIOR CERVICAL DECOMP/DISCECTOMY FUSION  03/03/2011   Procedure: ANTERIOR CERVICAL DECOMPRESSION/DISCECTOMY FUSION 3 LEVELS;  Surgeon: Floyce Stakes, MD;  Location: [facility];  Service: Neurosurgery;  Laterality: N/A;  Cervical three-four Cervical four-five Cervical five-six Anterior cervical decompression/diskectomy, fusion  . BACK SURGERY    . CARDIAC CATHETERIZATION  2009   Stents in RCA patent. 70 to 80% PL, and 50% ostial PD.   Marland Kitchen CARDIAC CATHETERIZATION N/A 11/20/2014   Procedure: Left Heart Cath and Coronary Angiography;  Surgeon: Peter M Martinique, MD;  Location: [place] [facility];  Service:  Cardiovascular;  Laterality: N/A;  . CORONARY ANGIOPLASTY     DIRECT ANGIOPLASTY THE MARGINAL BRANCH  . CORONARY ANGIOPLASTY WITH STENT PLACEMENT     RCA  . CORONARY ARTERY BYPASS GRAFT N/A 06/14/2017   Procedure: CORONARY ARTERY BYPASS GRAFTING (CABG) x four, using left internal mammary artery and right leg greater saphenous vein harvested endoscopically;  Surgeon: Ivin Poot, MD;  Location: [place];  Service: Open Heart Surgery;  Laterality: N/A;  . CYSTOSCOPY N/A 06/14/2017   Procedure: CYSTOSCOPY;  Surgeon: Prescott Gum, Collier Salina, MD;  Location: [place];  Service: Open Heart Surgery;  Laterality: N/A;  . ICD IMPLANT N/A 03/11/2018   Procedure: ICD IMPLANT - Dual Chamber;  Surgeon: Deboraha Sprang, MD;  Location: [place] [facility];  Service: Cardiovascular;  Laterality: N/A;  . INSERTION OF SUPRAPUBIC CATHETER N/A 06/14/2017   Procedure: INSERTION OF SUPRAPUBIC CATHETER - Lower abdomen;  Surgeon: Ivin Poot, MD;  Location: [place];  Service: Open Heart Surgery;  Laterality: N/A;  . POSTERIOR LUMBAR FUSION  10/2011  . RIGHT/LEFT HEART CATH AND CORONARY ANGIOGRAPHY N/A 06/08/2017   Procedure: RIGHT/LEFT HEART CATH AND CORONARY ANGIOGRAPHY;  Surgeon: Martinique, Peter M, MD;  Location: [place] [facility];  Service: Cardiovascular;  Laterality: N/A;  . TEE WITHOUT CARDIOVERSION N/A 06/14/2017   Procedure: TRANSESOPHAGEAL ECHOCARDIOGRAM (TEE);  Surgeon: Prescott Gum, Collier Salina, MD;  Location: [place];  Service: Open Heart Surgery;  Laterality: N/A;  . URETHROPLASTY N/A 10/15/2017   Procedure: URETHROPLASTY WITH BUCCAL GRAFT HARVAST;  Surgeon: Ardis Hughs, MD;  Location: [facility];  Service: Urology;  Laterality: N/A;    Allergies  Allergen Reactions  . Ezetimibe Other (See Comments)    Joint pain and drops BP  . Niacin Other (See Comments)    Drops BP and severe  . Statins Other (See Comments)    Severe joint pain and drops BP.    Social History   Socioeconomic History  . Marital status: Married     Spouse name: Not on file  . Number of children: 1  . Years of education: Not on file  . Highest education level: Not on file  Occupational History  . Occupation: Truck Press photographer  Tobacco Use  . Smoking status: Former Smoker    Packs/day: 2.00    Years: 20.00    Pack years: 40.00    Types: Cigarettes    Quit date: 05/07/1999    Years since quitting: 19.8  . Smokeless tobacco: Never Used  Substance and Sexual Activity  . Alcohol use: Yes    Comment: 06/09/2017 "1 beer/month; at most"  . Drug use: Never  . Sexual activity: Not on file  Other Topics Concern  . Not on file  Social History Narrative  .  Not on file   Social Determinants of Health   Financial Resource Strain:   . Difficulty of Paying Living Expenses:   Food Insecurity:   . Worried About Programme researcher, broadcasting/film/video in the Last Year:   . Barista in the Last Year:   Transportation Needs:   . Freight forwarder (Medical):   Marland Kitchen Lack of Transportation (Non-Medical):   Physical Activity:   . Days of Exercise per Week:   . Minutes of Exercise per Session:   Stress:   . Feeling of Stress :   Social Connections:   . Frequency of Communication with Friends and Family:   . Frequency of Social Gatherings with Friends and Family:   . Attends Religious Services:   . Active Member of Clubs or Organizations:   . Attends Banker Meetings:   Marland Kitchen Marital Status:   Intimate Partner Violence:   . Fear of Current or Ex-Partner:   . Emotionally Abused:   Marland Kitchen Physically Abused:   . Sexually Abused:     Family History  Problem Relation Age of Onset  . Heart attack Father   . Diabetes Father   . Breast cancer Mother   . Coronary artery disease Mother   . Heart disease Brother   . Heart attack Maternal Grandfather         Objective:  Physical Exam: BP 120/74   Ht 6\' 2"  (1.88 m)   Wt 218 lb (98.9 kg)   BMI 27.99 kg/m  Gen: NAD, comfortable in exam room Lungs: Breathing comfortably on room air Lumbar  Exam -Inspection: No deformity, no discoloration -Palpation: Tenderness palpation of right SI joint -ROM: Normal ROM with forward flexion, extension, lateral bending bilaterally, and rotation bilaterally -Strength: 5/5 hip flexion bilaterally, 5/5 knee extension bilaterally, 5/5 knee flexion bilaterally, 5/5 foot dorsiflexion bilaterally, 5/5 foot plantarflexion bilaterally -Straight leg: Negative -FABER: Positive  Knee Exam Right -Inspection: no deformity, no discoloration -Palpation: Medial joint line tenderness -ROM: Extension: 0 degrees; Flexion: 130 degrees -Strength: Extension: 5/5; Flexion: 5/5 -Special Tests: Varus Stress: Negative; Valgus Stress: Negative -Limb neurovascularly intact, no instability noted     Assessment & Plan:  Patient is a 64 y.o. male here for evaluation of low back pain follow-up of right knee and right hip pain  1.  SI joint dysfunction -Patient with broken hardware in his lumbar spine is seen on x-rays of his pelvis -Patient will be referred to [facility] imaging to have an injection done of his right SI joint -If the injection does not help patient will be referred back to surgery discussed broken hardware  2.  Right greater trochanteric pain syndrome -Patient with adequate improvement of his symptoms following corticosteroid injection  3.  Osteoarthritis of the right knee -Patient with mild improvement following corticosteroid injection -Patient declined referral to orthopedic surgery at this time -Patient will follow back up in 2 months for repeat injection.  If he changes his mind on discussing surgery he will let [facility] know  Patient will follow up as needed or in 2 months for repeat corticosteroid injection of his knee

## 2019-03-17 NOTE — Patient Instructions (Addendum)
Jenel Lucks from [facility] will call you to schedule the SI joint injection.  If this injection does not help then you should go see your back surgeon to see if there is anything else they can offer you  The pain in your knee is caused by arthritis.  We can do the cortisone injections every 3 to 4 months.  If you are able to wait an additional 2 months to get the next set of injections we will see you back then.  Otherwise if you would like referral to orthopedic surgery to discuss knee replacement please let us know.  If you find the pain in your hip is returning let us know and we can give you a set of exercises to strengthen your hip abductor muscles.  We will see back as needed

## 2019-03-18 NOTE — Progress Notes (Signed)
[facility]: Attending Note: I havediscussed wiht the Sports Medicine Fellow. I agree with assessment and treatment plan as detailed in the Fellow's note   Some improvement in his greater trochanteric bursitis and also some improvement in knee pain. No improvement in low back pain.  I would consider second injection in knee at next visit if he wants. He is still having signififcant low back pain and until we address that I suspect he will continue to have hip and knee pain. (gait change)  Re his back pain: He has old hardware from a lumbar fusion and his hip xrays revaled fracture of the hardware. He reports this occurred pretty soon after the original surgery so I don't think the hardware is necessarily the issue, however it is probably not doing the job or\ginally intended. On exam today, he seems to be having a lot of sacroiliac pain. Will try to get an injection through [facility] imaging for that and see him back. He also need to call the office where his back doctor was (Dr Jeral Fruit: now retired) and get appt. F/u 2-4 w. Marland Kitchen

## 2019-03-22 ENCOUNTER — Other Ambulatory Visit: Payer: Self-pay

## 2019-03-22 ENCOUNTER — Ambulatory Visit
Admission: RE | Admit: 2019-03-22 | Discharge: 2019-03-22 | Disposition: A | Discharge status: Home / Self Care | Payer: BC Managed Care – PPO | Source: Ambulatory Visit | Attending: Family Medicine | Admitting: Family Medicine

## 2019-03-22 DIAGNOSIS — M533 Sacrococcygeal disorders, not elsewhere classified: Secondary | ICD-10-CM

## 2019-03-22 MED ORDER — IOPAMIDOL (ISOVUE-M 200) INJECTION 41%
1.0000 mL | Freq: Once | INTRAMUSCULAR | Status: AC
  Administered 2019-03-22: 1 mL via INTRA_ARTICULAR

## 2019-03-22 MED ORDER — METHYLPREDNISOLONE ACETATE 40 MG/ML INJ SUSP (RADIOLOG
120.0000 mg | Freq: Once | INTRAMUSCULAR | Status: AC
  Administered 2019-03-22: 120 mg via INTRA_ARTICULAR

## 2019-03-22 NOTE — Discharge Instructions (Signed)

## 2019-03-28 ENCOUNTER — Encounter: Payer: Self-pay | Admitting: Family Medicine

## 2019-03-28 ENCOUNTER — Telehealth: Payer: Self-pay

## 2019-03-28 NOTE — Telephone Encounter (Signed)
**Note De-Identified [name] Obfuscation** I started an [place] PA through covermymeds: Key: [id number]

## 2019-04-04 NOTE — Telephone Encounter (Signed)
I received another [place] PA request from [employer]. I attempted to do another PA through covermymeds as  I have not received a determination on the PA I did on 3/23. I received the following message:  MARCOANTONIO LEGAULT Key: [id number] - Rx #: [id number]  Outcome  Cancelled on March 23  This product does not require authorization at this time, and may be covered at the pharmacy in accordance with your benefit plan.  DrugEntresto 24-26MG  tablets  [employer] DTE Prescription Drug Authorization Form  Original Claim [id number] MAXIMUM DAYS SUPPLY OF 3030  I called [facility] and s/w Almira Coaster who states that a PA is not required for the pts Entresto and that he picked it up on 3/23 and paid $10 for it.

## 2019-04-20 ENCOUNTER — Ambulatory Visit (INDEPENDENT_AMBULATORY_CARE_PROVIDER_SITE_OTHER): Payer: BC Managed Care – PPO | Admitting: *Deleted

## 2019-04-20 DIAGNOSIS — I5022 Chronic systolic (congestive) heart failure: Secondary | ICD-10-CM

## 2019-04-21 LAB — CUP PACEART REMOTE DEVICE CHECK
Battery Remaining Longevity: 43 mo
Battery Voltage: 2.96 V
Brady Statistic AP VP Percent: 0.15 %
Brady Statistic AP VS Percent: 0.05 %
Brady Statistic AS VP Percent: 99.52 %
Brady Statistic AS VS Percent: 0.28 %
Brady Statistic RA Percent Paced: 0.2 %
Brady Statistic RV Percent Paced: 99.2 %
Date Time Interrogation Session: 20210416101322
HighPow Impedance: 80 Ohm
Implantable Lead Implant Date: 20190610
Implantable Lead Implant Date: 20200306
Implantable Lead Implant Date: 20200306
Implantable Lead Location: 753858
Implantable Lead Location: 753859
Implantable Lead Location: 753860
Implantable Lead Model: 5071
Implantable Lead Model: 5076
Implantable Pulse Generator Implant Date: 20200306
Lead Channel Impedance Value: 342 Ohm
Lead Channel Impedance Value: 4047 Ohm
Lead Channel Impedance Value: 4047 Ohm
Lead Channel Impedance Value: 437 Ohm
Lead Channel Impedance Value: 494 Ohm
Lead Channel Impedance Value: 513 Ohm
Lead Channel Pacing Threshold Amplitude: 0.625 V
Lead Channel Pacing Threshold Amplitude: 0.875 V
Lead Channel Pacing Threshold Amplitude: 2 V
Lead Channel Pacing Threshold Pulse Width: 0.4 ms
Lead Channel Pacing Threshold Pulse Width: 0.4 ms
Lead Channel Pacing Threshold Pulse Width: 1 ms
Lead Channel Sensing Intrinsic Amplitude: 31.625 mV
Lead Channel Sensing Intrinsic Amplitude: 31.625 mV
Lead Channel Sensing Intrinsic Amplitude: 4.25 mV
Lead Channel Sensing Intrinsic Amplitude: 4.25 mV
Lead Channel Setting Pacing Amplitude: 1.75 V
Lead Channel Setting Pacing Amplitude: 2.5 V
Lead Channel Setting Pacing Amplitude: 3 V
Lead Channel Setting Pacing Pulse Width: 0.4 ms
Lead Channel Setting Pacing Pulse Width: 1 ms
Lead Channel Setting Sensing Sensitivity: 0.6 mV

## 2019-04-21 NOTE — Progress Notes (Signed)
ICD Remote  

## 2019-06-02 ENCOUNTER — Telehealth: Payer: Self-pay | Admitting: Cardiology

## 2019-06-02 NOTE — Telephone Encounter (Signed)
I called patient 06/02/19 to schedule follow up visit from Dr. Jordan's recall list. The patient didn't answer so I left message for patient to return call to be scheduled.  

## 2019-06-08 ENCOUNTER — Ambulatory Visit: Payer: BC Managed Care – PPO | Admitting: Sports Medicine

## 2019-06-09 ENCOUNTER — Ambulatory Visit (INDEPENDENT_AMBULATORY_CARE_PROVIDER_SITE_OTHER): Payer: BC Managed Care – PPO | Admitting: Family Medicine

## 2019-06-09 ENCOUNTER — Other Ambulatory Visit: Payer: Self-pay

## 2019-06-09 DIAGNOSIS — M25561 Pain in right knee: Secondary | ICD-10-CM | POA: Diagnosis not present

## 2019-06-09 DIAGNOSIS — M25512 Pain in left shoulder: Secondary | ICD-10-CM

## 2019-06-09 DIAGNOSIS — M25519 Pain in unspecified shoulder: Secondary | ICD-10-CM | POA: Insufficient documentation

## 2019-06-09 MED ORDER — METHYLPREDNISOLONE ACETATE 40 MG/ML IJ SUSP
40.0000 mg | Freq: Once | INTRAMUSCULAR | Status: AC
  Administered 2019-06-09: 40 mg via INTRA_ARTICULAR

## 2019-06-09 NOTE — Assessment & Plan Note (Addendum)
Patient with continued knee pain after a fall.  Seems consistent with his previous knee pain which at that time improved with corticosteroid injection.  Patient would like another corticosteroid injection.  Procedure performed: knee intraarticular corticosteroid injection; palpation guided  Consent obtained and verified. Time-out conducted. Noted no overlying erythema, induration, or other signs of local infection. The right medial joint space was palpated and marked. The overlying skin was prepped in a sterile fashion. Topical analgesic spray: Ethyl chloride. Joint: right knee Needle: 25 gauge, 1.5 inch Completed without difficulty. Meds: 40 mg methylrprednisolone, 4 ml 1% lidocaine without epinephrine  Advised to call if fevers/chills, erythema, induration, drainage, or persistent bleeding.  Consent obtained and verified. Time-out conducted.

## 2019-06-09 NOTE — Assessment & Plan Note (Addendum)
Patient with acute left shoulder pain following a fall where he caught himself on outstretched arms.  Visible exam seems consistent with musculoskeletal etiology.  Likely scapular muscle strain, seems to be levator scapular.  Does have full range of motion and good strength.  Will advise heat and scapular strengthening exercises. Advised to use tennis ball to help alleviate some of the tension in his levator scapula. Follow-up in 4-6 weeks.

## 2019-06-09 NOTE — Progress Notes (Signed)
[name] is a 64 y.o. male who presents to [facility] today for the following:  Right knee pain Patient presenting with right knee pain.  Was previously seen in February and had corticosteroid shots at that time.  States that he had pain relief after the shots for least 4 months.  States that 3 to 4 weeks ago he "tweaked" his knee when he slipped off a curb.  States he fell at that time.  Ever since he has had pain again which felt like it did in February before his steroid shot.  Has been using ice, heat but none of this helped.  States that it does flareup every once in a while now.  Does report some edema after the fall.  Did report instability which caused his fall 4 weeks ago.  Denies any walking.  Left shoulder pain  Patient presenting with shoulder pain.  States that it started after he fell 4 weeks ago.  Caught himself on outstretched arms and felt his shoulder pain afterwards.  States he feels a knot in the area.  Has been using ice, heat, CBD oil none of which helped.  Reports sometimes it hurts his neck when he turns it as well.  Denies any weakness.  PMH reviewed. Back pain, neck pain, knee pain  ROS as above. Medications reviewed.  Exam:  BP 138/74   Ht 6\' 2"  (1.88 m)   Wt 218 lb (98.9 kg)   BMI 27.99 kg/m  Gen: Well NAD MSK: Knee: - Inspection: no gross deformity. No swelling/effusion, erythema or bruising. Skin intact - Palpation: TTP along lateral joint line  - ROM: full active ROM with flexion and extension in knee and hip - Strength: 5/5 strength - Neuro/vasc: NV intact - Special Tests: - LIGAMENTS: negative anterior and posterior drawer, no MCL or LCL laxity  -- MENISCUS: negative McMurray's  -- PF JOINT: nml patellar mobility bilaterally.  negative patellar grind, negative patellar apprehension Hips: normal ROM, negative FABER and FADIR bilaterally Shoulder: Inspection reveals no obvious deformity, atrophy, or asymmetry. No bruising. No swelling Palpation is  normal with no TTP over [facility] joint or bicipital groove. Full ROM in flexion, abduction, internal/external rotation NV intact distally Normal scapular function observed. Special Tests:  - Impingement: Neg Hawkins, neers, Positive empty can sign. - Supraspinatous: Positive empty can.  5/5 strength with resisted flexion at 20 degrees - Infraspinatous/Teres Minor: 5/5 strength with ER - Subscapularis: 5/5 strength with IR - AC Joint: Negative cross arm - Negative apprehension test - No painful arc and no drop arm sign   Assessment and Plan: 1) Knee pain Patient with continued knee pain after a fall.  Seems consistent with his previous knee pain which at that time improved with corticosteroid injection.  Patient would like another corticosteroid injection.  Procedure performed: knee intraarticular corticosteroid injection; palpation guided  Consent obtained and verified. Time-out conducted. Noted no overlying erythema, induration, or other signs of local infection. The right medial joint space was palpated and marked. The overlying skin was prepped in a sterile fashion. Topical analgesic spray: Ethyl chloride. Joint: right knee Needle: 25 gauge, 1.5 inch Completed without difficulty. Meds: 40 mg methylrprednisolone, 4 ml 1% lidocaine without epinephrine  Advised to call if fevers/chills, erythema, induration, drainage, or persistent bleeding.  Consent obtained and verified. Time-out conducted.    Shoulder pain, acute Patient with acute left shoulder pain following a fall where he caught himself on outstretched arms.  Visible exam seems consistent with musculoskeletal  etiology.  Likely scapular muscle strain, seems to be levator scapular.  Does have full range of motion and good strength.  Will advise heat and scapular strengthening exercises. Advised to use tennis ball to help alleviate some of the tension in his levator scapula. Follow-up in 4-6 weeks.   Dalphine Handing, PGY-3  [facility] Resident  06/09/2019 11:49 AM

## 2019-06-09 NOTE — Patient Instructions (Signed)
1. Please do the shoulder exercises we discussed daily 2. Use a tennis ball either on the floor or wall to help rub against the muscle that is causing pain  3. Follow up in 4-6 weeks

## 2019-06-09 NOTE — Progress Notes (Signed)
[facility] Attending Note: I have seen and examined this patient. I have discussed this patient with the resident and reviewed the assessment and plan as documented above. I agree with the resident's findings and plan. Last knee injection helped for many months. recently had sort of a giving wy/fall episode with knee and has had pain sice. Exam pretty normal (some underlying OA likely but nothing acute). Will try repeat CSI as he did well before. If not improving as expected, he should RTC  Next 2-6 weeks. For his left levator scapula pain (also from fall) I recommend overhead press and massage with tennis ball. F/u PRN.

## 2019-07-03 ENCOUNTER — Other Ambulatory Visit: Payer: Self-pay | Admitting: Cardiology

## 2019-07-20 ENCOUNTER — Ambulatory Visit (INDEPENDENT_AMBULATORY_CARE_PROVIDER_SITE_OTHER): Payer: Managed Care, Other (non HMO) | Admitting: *Deleted

## 2019-07-20 DIAGNOSIS — I447 Left bundle-branch block, unspecified: Secondary | ICD-10-CM

## 2019-07-20 LAB — CUP PACEART REMOTE DEVICE CHECK
Battery Remaining Longevity: 38 mo
Battery Voltage: 2.96 V
Brady Statistic AP VP Percent: 0.17 %
Brady Statistic AP VS Percent: 0.06 %
Brady Statistic AS VP Percent: 99.49 %
Brady Statistic AS VS Percent: 0.28 %
Brady Statistic RA Percent Paced: 0.23 %
Brady Statistic RV Percent Paced: 99.33 %
Date Time Interrogation Session: 20210715033524
HighPow Impedance: 83 Ohm
Implantable Lead Implant Date: 20190610
Implantable Lead Implant Date: 20200306
Implantable Lead Implant Date: 20200306
Implantable Lead Location: 753858
Implantable Lead Location: 753859
Implantable Lead Location: 753860
Implantable Lead Model: 5071
Implantable Lead Model: 5076
Implantable Pulse Generator Implant Date: 20200306
Lead Channel Impedance Value: 323 Ohm
Lead Channel Impedance Value: 399 Ohm
Lead Channel Impedance Value: 4047 Ohm
Lead Channel Impedance Value: 4047 Ohm
Lead Channel Impedance Value: 437 Ohm
Lead Channel Impedance Value: 456 Ohm
Lead Channel Pacing Threshold Amplitude: 0.625 V
Lead Channel Pacing Threshold Amplitude: 0.875 V
Lead Channel Pacing Threshold Amplitude: 2.125 V
Lead Channel Pacing Threshold Pulse Width: 0.4 ms
Lead Channel Pacing Threshold Pulse Width: 0.4 ms
Lead Channel Pacing Threshold Pulse Width: 1 ms
Lead Channel Sensing Intrinsic Amplitude: 3.5 mV
Lead Channel Sensing Intrinsic Amplitude: 3.5 mV
Lead Channel Sensing Intrinsic Amplitude: 31.625 mV
Lead Channel Sensing Intrinsic Amplitude: 31.625 mV
Lead Channel Setting Pacing Amplitude: 1.75 V
Lead Channel Setting Pacing Amplitude: 2.5 V
Lead Channel Setting Pacing Amplitude: 3 V
Lead Channel Setting Pacing Pulse Width: 0.4 ms
Lead Channel Setting Pacing Pulse Width: 1 ms
Lead Channel Setting Sensing Sensitivity: 0.6 mV

## 2019-07-20 NOTE — Progress Notes (Signed)
Cardiology Office Note:    Date:  07/24/2019   ID:  [name], DOB Dec 20, 1955, MRN [id number]  PCP:  Jarome Matin, MD  Cardiologist:  Peter Swaziland, MD  Electrophysiologist:  None   Referring MD: Jarome Matin, MD   Chief Complaint  Patient presents with  . Follow-up  . Congestive Heart Failure  . Coronary Artery Disease    History of Present Illness:    [name] is a 64 y.o. male with a hx of CAD s/p CABG, hypertension, left bundle branch block, hyperlipidemia, and chronic systolic heart failure.  Patient was admitted in June 2019 with flash pulmonary edema.  He was ruled in for NSTEMI and underwent cardiac catheterization which showed severe three-vessel CAD. He underwent CABG by Dr Donata Clay with LIMA to LAD, SVG to diagonal, SVG to ramus intermediate, and SVG to RCA.  He had a syncope in June 2019 while waiting in the neurology office due to urethral stricture.  He was noted to have wide-complex tachycardia on telemetry with heart rate of 170.  EF was 35 to 40%.  Lower extremity Doppler was negative for DVT.  CT of the chest was negative for PE.  He was seen by EP, the underlying rhythm could be either VT versus NSVT.  He was fitted with LifeVest.  Medication was cut back due to mild hypotension and orthostasis.  Repeat echocardiogram in September 2019 showed persistently low EF of 25 to 30%.  He was unable to tolerate Entresto, spironolactone and carvedilol due to orthostatic symptom and the lightheadedness.  Patient eventually underwent BIV ICD implantation in March 2020 by Dr. Graciela Husbands after failing another trial of medical therapy.  Since then, repeat echocardiogram in July 2020 did show significant improvement in EF to 40 to 45%.  On follow up today he is doing very well from a cardiac standpoint. He denies any chest pain, dyspnea, palpitations, edema. He is having a lot of aches and pains in his back, hips, neck, knees. Has been trying stretching and accupuncture without  relief. Has tried holding medication but this did not help.  Notes some stomach upset. Exercising a couple days a week.     Past Medical History:  Diagnosis Date  . Arthritis    "mild; back, neck, spine" (06/09/2017)  . CHF (congestive heart failure) (HCC)   . Coronary artery disease   . History of gout   . History of kidney stones X 2  . Hypercholesterolemia   . Hypertension   . LBBB (left bundle branch block)   . MI (myocardial infarction) (HCC) 1993   LATERAL  . MI (myocardial infarction) (HCC) ?09/2001; 06/07/2017  . Pneumonia    "several times" (06/09/2017)  . S/P coronary artery stent placement    RCA  . Varicose veins     Past Surgical History:  Procedure Laterality Date  . ANTERIOR CERVICAL DECOMP/DISCECTOMY FUSION  03/03/2011   Procedure: ANTERIOR CERVICAL DECOMPRESSION/DISCECTOMY FUSION 3 LEVELS;  Surgeon: Karn Cassis, MD;  Location: [facility] ORS;  Service: Neurosurgery;  Laterality: N/A;  Cervical three-four Cervical four-five Cervical five-six Anterior cervical decompression/diskectomy, fusion  . BACK SURGERY    . CARDIAC CATHETERIZATION  2009   Stents in RCA patent. 70 to 80% PL, and 50% ostial PD.   Marland Kitchen CARDIAC CATHETERIZATION N/A 11/20/2014   Procedure: Left Heart Cath and Coronary Angiography;  Surgeon: Peter M Swaziland, MD;  Location: [facility] INVASIVE CV LAB;  Service: Cardiovascular;  Laterality: N/A;  . CORONARY ANGIOPLASTY  DIRECT ANGIOPLASTY THE MARGINAL BRANCH  . CORONARY ANGIOPLASTY WITH STENT PLACEMENT     RCA  . CORONARY ARTERY BYPASS GRAFT N/A 06/14/2017   Procedure: CORONARY ARTERY BYPASS GRAFTING (CABG) x four, using left internal mammary artery and right leg greater saphenous vein harvested endoscopically;  Surgeon: Kerin Perna, MD;  Location: [facility] OR;  Service: Open Heart Surgery;  Laterality: N/A;  . CYSTOSCOPY N/A 06/14/2017   Procedure: CYSTOSCOPY;  Surgeon: Donata Clay, Theron Arista, MD;  Location: [facility] OR;  Service: Open Heart Surgery;  Laterality: N/A;  .  ICD IMPLANT N/A 03/11/2018   Procedure: ICD IMPLANT - Dual Chamber;  Surgeon: [name], MD;  Location: [facility] INVASIVE CV LAB;  Service: Cardiovascular;  Laterality: N/A;  . INSERTION OF SUPRAPUBIC CATHETER N/A 06/14/2017   Procedure: INSERTION OF SUPRAPUBIC CATHETER - Lower abdomen;  Surgeon: Kerin Perna, MD;  Location: [facility] OR;  Service: Open Heart Surgery;  Laterality: N/A;  . POSTERIOR LUMBAR FUSION  10/2011  . RIGHT/LEFT HEART CATH AND CORONARY ANGIOGRAPHY N/A 06/08/2017   Procedure: RIGHT/LEFT HEART CATH AND CORONARY ANGIOGRAPHY;  Surgeon: Swaziland, Peter M, MD;  Location: [facility] INVASIVE CV LAB;  Service: Cardiovascular;  Laterality: N/A;  . TEE WITHOUT CARDIOVERSION N/A 06/14/2017   Procedure: TRANSESOPHAGEAL ECHOCARDIOGRAM (TEE);  Surgeon: Donata Clay, Theron Arista, MD;  Location: [facility] OR;  Service: Open Heart Surgery;  Laterality: N/A;  . URETHROPLASTY N/A 10/15/2017   Procedure: URETHROPLASTY WITH BUCCAL GRAFT HARVAST;  Surgeon: Crist Fat, MD;  Location: [facility] ORS;  Service: Urology;  Laterality: N/A;    Current Medications: Current Meds  Medication Sig  . acetaminophen (TYLENOL) 500 MG tablet Take 1,000 mg by mouth 2 (two) times daily.  Marland Kitchen aspirin 81 MG chewable tablet Chew 81 mg by mouth daily.  . carvedilol (COREG) 6.25 MG tablet TAKE ONE AND A HALF TABLETS BY MOUTH 2 TIMES DAILY  . esomeprazole (NEXIUM 24HR) 20 MG capsule Take 1 capsule (20 mg total) by mouth daily.  . fenofibrate (TRICOR) 145 MG tablet TAKE 1 TABLET BY MOUTH EVERY DAY  . Multiple Vitamins-Minerals (MENS MULTIVITAMIN PLUS PO) Take 1 tablet by mouth 2 (two) times daily.  Marland Kitchen OVER THE COUNTER MEDICATION Take 2 capsules by mouth at bedtime. Deep Sleep  . Probiotic Product (PROBIOTIC PO) Take 1 capsule by mouth 2 (two) times daily.  Marland Kitchen REPATHA SURECLICK 140 MG/ML SOAJ INJECT INTO SKIN EVERY 14 DAYS  . sacubitril-valsartan (ENTRESTO) 24-26 MG Take 0.5 tablets by mouth 2 (two) times daily.  Marland Kitchen zolpidem (AMBIEN) 10 MG tablet TAKE  1 TABLET BY MOUTH AT BEDTIME AS NEEDED  . [DISCONTINUED] esomeprazole (NEXIUM 24HR) 20 MG capsule Take 1 capsule (20 mg total) by mouth daily.     Allergies:   Ezetimibe, Niacin, and Statins   Social History   Socioeconomic History  . Marital status: Married    Spouse name: Not on file  . Number of children: 1  . Years of education: Not on file  . Highest education level: Not on file  Occupational History  . Occupation: Truck Airline pilot  Tobacco Use  . Smoking status: Former Smoker    Packs/day: 2.00    Years: 20.00    Pack years: 40.00    Types: Cigarettes    Quit date: 05/07/1999    Years since quitting: 20.2  . Smokeless tobacco: Never Used  Vaping Use  . Vaping Use: Never used  Substance and Sexual Activity  . Alcohol use: Yes    Comment: 06/09/2017 "1  beer/month; at most"  . Drug use: Never  . Sexual activity: Not on file  Other Topics Concern  . Not on file  Social History Narrative  . Not on file   Social Determinants of Health   Financial Resource Strain:   . Difficulty of Paying Living Expenses:   Food Insecurity:   . Worried About Programme researcher, broadcasting/film/video in the Last Year:   . Barista in the Last Year:   Transportation Needs:   . Freight forwarder (Medical):   Marland Kitchen Lack of Transportation (Non-Medical):   Physical Activity:   . Days of Exercise per Week:   . Minutes of Exercise per Session:   Stress:   . Feeling of Stress :   Social Connections:   . Frequency of Communication with Friends and Family:   . Frequency of Social Gatherings with Friends and Family:   . Attends Religious Services:   . Active Member of Clubs or Organizations:   . Attends Banker Meetings:   Marland Kitchen Marital Status:      Family History: The patient's family history includes Breast cancer in his mother; Coronary artery disease in his mother; Diabetes in his father; Heart attack in his father and maternal grandfather; Heart disease in his brother.  ROS:   Please see  the history of present illness.     All other systems reviewed and are negative.  EKGs/Labs/Other Studies Reviewed:    The following studies were reviewed today:  Echo 07/11/2018 1. The left ventricle has mild-moderately reduced systolic function, with an ejection fraction of 40-45%. The cavity size was normal. Left ventricular diastolic Doppler parameters are indeterminate. Indeterminate filling pressures.  2. The right ventricle has normal systolic function. The cavity was normal. There is no increase in right ventricular wall thickness. Right ventricular systolic pressure could not be assessed.  3. Left atrial size was mildly dilated.  4. Aortic valve regurgitation is mild to moderate by color flow Doppler. No stenosis of the aortic valve.  5. The Ascending aorta measures 39 mm, within normal limits when indexed to body surface area.  6. When compared to the prior study: 01/21/2018- LV ejection fraction has improved. Ventricular septal contraction is less dysynchronous. Aortic valve regurgitation has not significantly changed. Side by side comparison of images performed.   EKG:  EKG is ordered today.  The ekg ordered today demonstrates atrial sensed ventricularly paced rhythm  Recent Labs: 01/02/2019: ALT 20  Recent Lipid Panel    Component Value Date/Time   CHOL 120 01/02/2019 0923   TRIG 236 (H) 01/02/2019 0923   HDL 43 01/02/2019 0923   CHOLHDL 2.8 01/02/2019 0923   CHOLHDL 2.6 10/29/2017 0939   VLDL 27 10/29/2017 0939   LDLCALC 40 01/02/2019 0923    Physical Exam:    VS:  BP 136/74   Pulse 74   Ht 6\' 2"  (1.88 m)   Wt 217 lb 3.2 oz (98.5 kg)   BMI 27.89 kg/m     Wt Readings from Last 3 Encounters:  07/24/19 217 lb 3.2 oz (98.5 kg)  06/09/19 218 lb (98.9 kg)  03/17/19 218 lb (98.9 kg)     GEN:  Well nourished, well developed in no acute distress HEENT: Normal NECK: No JVD; No carotid bruits LYMPHATICS: No lymphadenopathy CARDIAC: RRR, no murmurs, rubs,  gallops RESPIRATORY:  Clear to auscultation without rales, wheezing or rhonchi  ABDOMEN: Soft, non-tender, non-distended MUSCULOSKELETAL:  No edema; No deformity  SKIN: Warm and  dry NEUROLOGIC:  Alert and oriented x 3 PSYCHIATRIC:  Normal affect   ASSESSMENT:    1. Chronic systolic heart failure (HCC)   2. Coronary artery disease involving coronary bypass graft of native heart without angina pectoris   3. S/P CABG x 4   4. LBBB (left bundle branch block)   5. Biventricular implantable cardioverter-defibrillator in situ   6. Hyperlipidemia, unspecified hyperlipidemia type    PLAN:    In order of problems listed above:  1. CAD s/p CABG 2019: no angina at this time.  Continue Aspirin and carvedilol.    2. Hyperlipidemia: On Repatha.  lipids excellent.  3. Hypertension: Blood pressure  is well controlled.  4. Chronic systolic heart failure: Euvolemic on today's exam. Last EF 40-45% On appropriate heart failure regimen include carvedilol and Entresto.  5. Status post biventricular ICD: Followed by EP.   recent device interrogation did not mention any ventricular tachycardia. He does have soreness around device on palpation.  6. GERD: Prescribed Nexium.  7.   Diffuse aches and pains. Does not appear medication related. Recommend follow up with PCP to see if he has a rheumatologic issue.    Medication Adjustments/Labs and Tests Ordered: Current medicines are reviewed at length with the patient today.  Concerns regarding medicines are outlined above.  No orders of the defined types were placed in this encounter.  Meds ordered this encounter  Medications  . esomeprazole (NEXIUM 24HR) 20 MG capsule    Sig: Take 1 capsule (20 mg total) by mouth daily.    Dispense:  90 capsule    Refill:  2    There are no Patient Instructions on file for this visit.   Signed, Peter Swaziland, MD  07/24/2019 3:46 PM    [facility]

## 2019-07-21 ENCOUNTER — Telehealth: Payer: Self-pay

## 2019-07-21 NOTE — Progress Notes (Signed)
Remote ICD transmission.   

## 2019-07-21 NOTE — Telephone Encounter (Signed)
I let the pt know he do not have to send manual transmissions.

## 2019-07-24 ENCOUNTER — Ambulatory Visit (INDEPENDENT_AMBULATORY_CARE_PROVIDER_SITE_OTHER): Payer: Managed Care, Other (non HMO) | Admitting: Cardiology

## 2019-07-24 ENCOUNTER — Other Ambulatory Visit: Payer: Self-pay

## 2019-07-24 ENCOUNTER — Encounter: Payer: Self-pay | Admitting: Cardiology

## 2019-07-24 VITALS — BP 136/74 | HR 74 | Ht 74.0 in | Wt 217.2 lb

## 2019-07-24 DIAGNOSIS — E785 Hyperlipidemia, unspecified: Secondary | ICD-10-CM

## 2019-07-24 DIAGNOSIS — I25810 Atherosclerosis of coronary artery bypass graft(s) without angina pectoris: Secondary | ICD-10-CM | POA: Diagnosis not present

## 2019-07-24 DIAGNOSIS — I5022 Chronic systolic (congestive) heart failure: Secondary | ICD-10-CM

## 2019-07-24 DIAGNOSIS — I447 Left bundle-branch block, unspecified: Secondary | ICD-10-CM

## 2019-07-24 DIAGNOSIS — Z951 Presence of aortocoronary bypass graft: Secondary | ICD-10-CM | POA: Diagnosis not present

## 2019-07-24 DIAGNOSIS — Z95810 Presence of automatic (implantable) cardiac defibrillator: Secondary | ICD-10-CM

## 2019-07-24 MED ORDER — ESOMEPRAZOLE MAGNESIUM 20 MG PO CPDR: 20.0000 mg | DELAYED_RELEASE_CAPSULE | Freq: Every day | ORAL | 2 refills | Status: DC

## 2019-07-28 ENCOUNTER — Other Ambulatory Visit: Payer: Self-pay | Admitting: Cardiology

## 2019-08-02 ENCOUNTER — Other Ambulatory Visit: Payer: Self-pay | Admitting: Cardiology

## 2019-08-08 ENCOUNTER — Telehealth: Payer: Self-pay | Admitting: Cardiology

## 2019-08-08 NOTE — Telephone Encounter (Signed)
*  STAT* If patient is at the pharmacy, call can be transferred to refill team.   1. Which medications need to be refilled? (please list name of each medication and dose if known) zolpidem (AMBIEN) 10 MG tablet  2. Which pharmacy/location (including street and city if local pharmacy) is medication to be sent to? [facility] - [place], [place] - [address]  3. Do they need a 30 day or 90 day supply? 90 day

## 2019-08-09 MED ORDER — ZOLPIDEM TARTRATE 10 MG PO TABS: 10.0000 mg | ORAL_TABLET | Freq: Every evening | ORAL | 0 refills | Status: DC | PRN

## 2019-08-09 NOTE — Telephone Encounter (Signed)
Spoke to patient he stated he cannot get appointment with PCP this year.Advised Dr.Jordan is out of office.I will call [facility] and approve Ambien # 30 no refills.

## 2019-08-12 ENCOUNTER — Other Ambulatory Visit: Payer: Self-pay | Admitting: Cardiology

## 2019-08-14 ENCOUNTER — Telehealth: Payer: Self-pay | Admitting: Cardiology

## 2019-08-15 MED ORDER — ZOLPIDEM TARTRATE 10 MG PO TABS: 10.0000 mg | ORAL_TABLET | Freq: Every evening | ORAL | 0 refills | Status: DC | PRN

## 2019-08-15 NOTE — Telephone Encounter (Signed)
Spoke to patient he stated he is trying to find a PCP.Stated he cannot been seen until first of year.Ambien 30 day refill phoned into [facility].

## 2019-08-15 NOTE — Telephone Encounter (Signed)
Patient states that [facility] has still not received the script for Ambien.

## 2019-08-15 NOTE — Addendum Note (Signed)
Addended by: Neoma Laming on: 08/15/2019 02:18 PM   Modules accepted: Orders

## 2019-08-17 ENCOUNTER — Other Ambulatory Visit: Payer: Self-pay | Admitting: Internal Medicine

## 2019-09-12 ENCOUNTER — Other Ambulatory Visit: Payer: Self-pay | Admitting: Cardiology

## 2019-09-15 ENCOUNTER — Other Ambulatory Visit: Payer: Self-pay | Admitting: Cardiology

## 2019-09-18 ENCOUNTER — Telehealth: Payer: Self-pay | Admitting: Cardiology

## 2019-09-18 ENCOUNTER — Other Ambulatory Visit: Payer: Self-pay | Admitting: Cardiology

## 2019-09-18 MED ORDER — ZOLPIDEM TARTRATE 10 MG PO TABS
10.0000 mg | ORAL_TABLET | Freq: Every evening | ORAL | 4 refills | Status: DC | PRN
Start: 2019-09-18 — End: 2019-12-11

## 2019-09-18 NOTE — Telephone Encounter (Signed)
Pt c/o medication issue:  1. Name of Medication: zolpidem (AMBIEN) 10 MG tablet  2. How are you currently taking this medication (dosage and times per day)? 1 tablet by mouth at bedtime   3. Are you having a reaction (difficulty breathing--STAT)? No   4. What is your medication issue? [name] is calling requesting to speak with Dr. Swaziland in regards to this medication. He states [name] advised him the office was only going to fill it for one more month last time he had it filled, and he is needing a refill. He is wanting to know what he is supposed to do due to this, because he has to take it every night to be able to sleep. Please advise.

## 2019-09-18 NOTE — Telephone Encounter (Signed)
Called patient left Dr.Jordan's advice on personal voice mail.Spoke to pharmacist at [facility] Ambien refilled.

## 2019-09-18 NOTE — Telephone Encounter (Signed)
Pt left a message returning nurse call. 

## 2019-09-18 NOTE — Telephone Encounter (Signed)
Spoke with the patient who states that he needs a refill on his [place]. He was advised when this was last refilled that he would not be getting anymore refills. He states that he needs the [place] every night in order to sleep. He states that without it he gets maybe two hours of sleep. Patient is unable to get in with primary care until January 2022. He states that he is convinced that some of the medications that he is taking are what is causing him to have trouble sleeping and that if he cant take ambien then he is going to quit taking all of his medications.

## 2019-09-18 NOTE — Telephone Encounter (Signed)
OK to refill until he gets established with primary care. #30 tablets with 4 refills. After that should have it filled by primary care   Luvenia Cranford Swaziland MD, [facility]

## 2019-09-18 NOTE — Telephone Encounter (Signed)
Left message for patient to call back  

## 2019-10-13 ENCOUNTER — Ambulatory Visit (INDEPENDENT_AMBULATORY_CARE_PROVIDER_SITE_OTHER): Payer: BC Managed Care – PPO | Admitting: Family Medicine

## 2019-10-13 ENCOUNTER — Other Ambulatory Visit: Payer: Self-pay

## 2019-10-13 VITALS — BP 132/82 | Ht 74.0 in | Wt 218.0 lb

## 2019-10-13 DIAGNOSIS — M25512 Pain in left shoulder: Secondary | ICD-10-CM | POA: Diagnosis not present

## 2019-10-13 DIAGNOSIS — M25561 Pain in right knee: Secondary | ICD-10-CM

## 2019-10-13 DIAGNOSIS — M25559 Pain in unspecified hip: Secondary | ICD-10-CM | POA: Diagnosis not present

## 2019-10-13 MED ORDER — METHYLPREDNISOLONE ACETATE 40 MG/ML IJ SUSP
40.0000 mg | Freq: Once | INTRAMUSCULAR | Status: AC
  Administered 2019-10-13: 40 mg via INTRA_ARTICULAR

## 2019-10-13 NOTE — Progress Notes (Signed)
SUBJECTIVE:   CHIEF COMPLAINT / HPI:   Right Knee Pain Patient returns to clinic who is well-known and has right knee osteoarthritis.  He states that the last injection of corticosteroid gave him about 1 month of good relief however after after that his symptoms returned rather quickly.  He can do his activities of daily living and does not alter or modify his hobbies, errands, or needed activity based on his pain however he does state the corticosteroid injections are becoming less effective.  Discussed with him his current options as he is already doing conservative management with rest, ice, over-the-counter topicals such as Voltaren gel and over-the-counter Tylenol with little benefit.  However he does think that these things he continues to do does keep the pain at bay.  He is interested in doing gel injections as he has not ready to have a total joint replacement at this time.  Right Hip Pain Patient has recurrence of pain over the outside of his right hip with no pain in the groin area.  This pain is very tender and hurts when he pressure is applied to it.  He has pain when I palpate the greater troches on the right.  He has known history of greater trochanteric bursitis and he states this feels exactly as it does before.  He is requesting injection today.  Left back shoulder pain Patient states for about 1 week he has been having pain over one particular area in the back of his left shoulder.  He does not know of any inciting event and denies any trauma which could have caused it.  It is very tender to palpation and he can push on it and recreate the pain.  Movement does not necessarily hurt as much as pushing on the area does.  It does radiate up the back of the left shoulder and more towards the neck.  However he can move his neck freely and states he has no pain upon doing so.  He denies any numbness, tingling, weakness of the left arm.  He states he has had trigger points before and  typically injections give him good relief.   PERTINENT  PMH / PSH: HTN, Hx of right knee OA, Hx of greater trochanteric bursitis  OBJECTIVE:   BP 132/82   Ht 6\' 2"  (1.88 m)   Wt 218 lb (98.9 kg)   BMI 27.99 kg/m   No flowsheet data found.  Shoulder, Left: No evidence of bony deformity, asymmetry, or muscle atrophy; No tenderness over long head of biceps (bicipital groove). No TTP at [facility] joint. TTP at middle of the rhomboid. Full active range of motion (180 flex [facility] /150Abd /90ER /70IR). Strength 5/5 throughout. Sensation intact. Peripheral pulses intact.   Knee, Right: Inspection was negative for erythema, ecchymosis, and effusion. No obvious bony abnormalities but signs of osteophyte development. Palpation yielded no asymmetric warmth; Positive for joint line tenderness; Positive patellar crepitus. ROM normal in flexion (135 degrees) and extension (0 degrees). Neurovascularly intact bilaterally.  Hip, Right: No obvious rash, erythema, ecchymosis, or edema. ROM full in all directions; Greater trochanter positive for tenderness to palpation.   ASSESSMENT/PLAN:   Hip pain Reoccurrence of right hip greater trochanteric bursitis. - Injection repeated today noted below as this has provided him with good relief in the past. - Follow up as needed.  Knee pain Patient with known long standing knee OA. Injections are now only lasting about one month. Discussed his options and he is already  doing conservative management such as OTC Tylenol, Advil, Ice, rest, and heat. These are keeping his symptoms from debilitating him but overall, it is not controlling the pain. - Injection with steroid today, will contact his insurance to get approval to proceed with gel series injections today. - Return after insurance approval and begin Visco supplementation  Shoulder pain, acute Left rhomboid trigger point as this specific area is exquisitely TTP and no radiating symptoms coming from the neck or  shoulder. - Injection as noted below. Follow up as needed.   Knee Injection: Written and verbal consent was obtained after discussing the risks and benefits of the procedure with the patient. A timeout was performed and the correct site and side was identified and confirmed. The anterior knee was cleansed in a sterile fashion with betadine and alcohol pad. Ethyl cholride was used for local anesthetic. 40 mg Depo-medrol and 3 cc 1% Lidocaine was injected using an anterolateral approach using a 5 cc syringe and 22 gauge 11/2 in needle. No complications were encountered. Minimal blood loss. A band aid was applied.   Right Greater Trochanteric Bursa Injection: Written and verbal consent was obtained after discussing the risks and benefits of the procedure with the patient. A timeout was performed and the correct site and side was identified and confirmed.  Point of maximal tenderness identified over the great trochanter. The greater trochanter of the right hip was cleansed in a sterile fashion with betadine and alcohol pad. 40 mg Depo-medrol and 3 cc 1% Lidocaine was injected using a lateral approach using a 5 cc syringe and 22 gauge 11/2 in needle. No complications were encountered. Minimal blood loss. A band aid was applied.   Left Rhomboid Trigger Point Injection: Written and verbal consent was obtained after discussing the risks and benefits of the procedure with the patient. A timeout was performed and the correct site and side was identified and confirmed.  Point of maximal tenderness identified over the left rhomboid in the center to the muscle. The point of maximal tenderness was cleansed in a sterile fashion with betadine and alcohol pad. 40 mg Depo-medrol and 1 cc 1% Lidocaine was injected using a lateral approach using a 5 cc syringe and 22 gauge 11/2 in needle. No complications were encountered. Minimal blood loss. A band aid was applied.    Arlyce Harman, DO PGY-4, Sports Medicine  Fellow [facility]

## 2019-10-13 NOTE — Assessment & Plan Note (Signed)
Left rhomboid trigger point as this specific area is exquisitely TTP and no radiating symptoms coming from the neck or shoulder. - Injection as noted below. Follow up as needed.

## 2019-10-13 NOTE — Assessment & Plan Note (Signed)
Reoccurrence of right hip greater trochanteric bursitis. - Injection repeated today noted below as this has provided him with good relief in the past. - Follow up as needed.

## 2019-10-13 NOTE — Patient Instructions (Signed)
Patient declined  

## 2019-10-13 NOTE — Progress Notes (Signed)
[facility]: Attending Note: I have reviewed the chart, discussed wit the Sports Medicine Fellow. I agree with assessment and treatment plan as detailed in the Fellow's note. Recurrence of hip and knee pain.  He has done well with corticosteroid injection previously we will try that today.  Regarding his knee pain, I think most of this is the patellofemoral joint compartment.    He would like to pursue discussion of viscosupplementation.  He really would like to put off total knee replacement for little bit longer if he can.  We will schedule him back to see Dr. Pearletha Forge for discussion about viscosupplementation.    We did give him corticosteroid injection today in the knee joint.  We also gave him greater trochanteric bursa injection.  Additionally, today he had very tender trigger point in his left rhomboid area which we injected.  Be happy to see him back at any point.  We will call him about the appointment with Dr. Pearletha Forge

## 2019-10-13 NOTE — Assessment & Plan Note (Signed)
Patient with known long standing knee OA. Injections are now only lasting about one month. Discussed his options and he is already doing conservative management such as OTC Tylenol, Advil, Ice, rest, and heat. These are keeping his symptoms from debilitating him but overall, it is not controlling the pain. - Injection with steroid today, will contact his insurance to get approval to proceed with gel series injections today. - Return after insurance approval and begin Visco supplementation

## 2019-10-19 ENCOUNTER — Ambulatory Visit (INDEPENDENT_AMBULATORY_CARE_PROVIDER_SITE_OTHER): Payer: BC Managed Care – PPO

## 2019-10-19 DIAGNOSIS — I255 Ischemic cardiomyopathy: Secondary | ICD-10-CM

## 2019-10-19 LAB — CUP PACEART REMOTE DEVICE CHECK
Battery Remaining Longevity: 35 mo
Battery Voltage: 2.95 V
Brady Statistic AP VP Percent: 0.19 %
Brady Statistic AP VS Percent: 0.06 %
Brady Statistic AS VP Percent: 99.47 %
Brady Statistic AS VS Percent: 0.28 %
Brady Statistic RA Percent Paced: 0.25 %
Brady Statistic RV Percent Paced: 99.07 %
Date Time Interrogation Session: 20211014012403
HighPow Impedance: 85 Ohm
Implantable Lead Implant Date: 20190610
Implantable Lead Implant Date: 20200306
Implantable Lead Implant Date: 20200306
Implantable Lead Location: 753858
Implantable Lead Location: 753859
Implantable Lead Location: 753860
Implantable Lead Model: 5071
Implantable Lead Model: 5076
Implantable Pulse Generator Implant Date: 20200306
Lead Channel Impedance Value: 323 Ohm
Lead Channel Impedance Value: 4047 Ohm
Lead Channel Impedance Value: 4047 Ohm
Lead Channel Impedance Value: 437 Ohm
Lead Channel Impedance Value: 494 Ohm
Lead Channel Impedance Value: 513 Ohm
Lead Channel Pacing Threshold Amplitude: 0.625 V
Lead Channel Pacing Threshold Amplitude: 0.75 V
Lead Channel Pacing Threshold Amplitude: 1.75 V
Lead Channel Pacing Threshold Pulse Width: 0.4 ms
Lead Channel Pacing Threshold Pulse Width: 0.4 ms
Lead Channel Pacing Threshold Pulse Width: 1 ms
Lead Channel Sensing Intrinsic Amplitude: 31.625 mV
Lead Channel Sensing Intrinsic Amplitude: 31.625 mV
Lead Channel Sensing Intrinsic Amplitude: 4.25 mV
Lead Channel Sensing Intrinsic Amplitude: 4.25 mV
Lead Channel Setting Pacing Amplitude: 1.5 V
Lead Channel Setting Pacing Amplitude: 2.5 V
Lead Channel Setting Pacing Amplitude: 3 V
Lead Channel Setting Pacing Pulse Width: 0.4 ms
Lead Channel Setting Pacing Pulse Width: 1 ms
Lead Channel Setting Sensing Sensitivity: 0.6 mV

## 2019-10-24 NOTE — Progress Notes (Signed)
Remote ICD transmission.   

## 2019-10-28 ENCOUNTER — Other Ambulatory Visit (HOSPITAL_COMMUNITY): Payer: Self-pay | Admitting: Cardiology

## 2019-11-03 ENCOUNTER — Other Ambulatory Visit: Payer: Self-pay

## 2019-11-03 ENCOUNTER — Ambulatory Visit (INDEPENDENT_AMBULATORY_CARE_PROVIDER_SITE_OTHER): Payer: BC Managed Care – PPO | Admitting: Family Medicine

## 2019-11-03 DIAGNOSIS — G8929 Other chronic pain: Secondary | ICD-10-CM | POA: Diagnosis not present

## 2019-11-03 DIAGNOSIS — M25561 Pain in right knee: Secondary | ICD-10-CM | POA: Diagnosis not present

## 2019-11-03 NOTE — Patient Instructions (Signed)
It was great to see you today! Thank you for letting me participate in your care!  Today, we gave you your first orthovisc injection. Please return next week to have your second in a series of 3 shots.  Be well, Jules Schick, DO PGY-4, Sports Medicine Fellow [facility]

## 2019-11-03 NOTE — Progress Notes (Signed)
    SUBJECTIVE:   CHIEF COMPLAINT / HPI:   Right knee osteoarthritis Patient has longstanding right knee pain and osteoarthritis and has been receiving corticosteroid injections for them and regular time intervals and is having less and less response to those shots in terms of improvement of his pain.  He is here today for an injection of his right knee with Orthovisc this is the first in a series of 3 injections.  We will see him back in 1 week to proceed with the second injection.  PERTINENT  PMH / PSH: Right knee OA  OBJECTIVE:   BP 120/84   Ht 6\' 2"  (1.88 m)   Wt 216 lb (98 kg)   BMI 27.73 kg/m   No flowsheet data found.   Knee, Right: Inspection was negative for erythema, ecchymosis, and effusion. No obvious bony abnormalities or signs of osteophyte development. Palpation yielded no asymmetric warmth; ROM normal in flexion (135 degrees) and extension (0 degrees).   ASSESSMENT/PLAN:   Right knee pain Patient presented today for the first injection in a series of 3 for Orthovisc for right knee osteoarthritis.  Patient tolerated procedure well as noted below.  He will return in 1 week for the second of 3.   Written and verbal consent was obtained after discussing the risks and benefits of the procedure with the patient. A timeout was performed and the correct site and side was identified and confirmed.  The right knee was cleaned in sterile fashion with alcohol pad. 3 cc 1% Lidocaine was injected using a anteromedial approach using a 5 cc syringe and 25 gauge 1 and 1/2 in needle, then the orthovisc was attached to the needle left in place and injected into the right knee joint space. No complications were encountered. Minimal blood loss. A band aid was applied.     , DO PGY-4, Sports Medicine Fellow [facility]

## 2019-11-03 NOTE — Assessment & Plan Note (Signed)
Patient presented today for the first injection in a series of 3 for Orthovisc for right knee osteoarthritis.  Patient tolerated procedure well as noted below.  He will return in 1 week for the second of 3.

## 2019-11-08 ENCOUNTER — Ambulatory Visit (INDEPENDENT_AMBULATORY_CARE_PROVIDER_SITE_OTHER): Payer: BC Managed Care – PPO | Admitting: Family Medicine

## 2019-11-08 ENCOUNTER — Other Ambulatory Visit: Payer: Self-pay

## 2019-11-08 VITALS — BP 134/80 | Ht 74.0 in | Wt 216.0 lb

## 2019-11-08 DIAGNOSIS — M25561 Pain in right knee: Secondary | ICD-10-CM

## 2019-11-08 DIAGNOSIS — M1711 Unilateral primary osteoarthritis, right knee: Secondary | ICD-10-CM

## 2019-11-08 NOTE — Progress Notes (Addendum)
    SUBJECTIVE:   CHIEF COMPLAINT / HPI:   Right knee osteoarthritis: 64 year old male with a history of right knee pain and osteoarthritis who had previously been receiving steroid injections and now presents for second of series of 3 Orthovisc injections. He states he has seen a major difference since the first injection.  PERTINENT  PMH / PSH: Right knee osteoarthritis  OBJECTIVE:   BP 134/80   Ht 6\' 2"  (1.88 m)   Wt 216 lb (98 kg)   BMI 27.73 kg/m   No flowsheet data found.  Knee, Right: Inspection was negative for erythema, ecchymosis, and effusion. No obvious bony abnormalities or signs of osteophyte development.  ASSESSMENT/PLAN:   Knee pain Orthovisc injection 2nd in a series of 3 given today. No complications. Procedure noted below. - Return in one week to have the 3rd and last given.    Written and verbal consent was obtained after discussing the risks and benefits of the procedure with the patient. A timeout was performed and the correct site and side was identified and confirmed.  The right knee was cleaned in sterile fashion with alcohol pad. 3 cc 1% Lidocaine was injected using an anteromedial approach using a 5 cc syringe and 25 gauge 1 and 1/2 in needle, then orthovisc was attached to the needle which remained in place and was injected into the right knee joint space. No complications were encountered. Minimal blood loss. A band aid was applied.   , DO [facility], PGY-4  Addendum:  Patient seen in the office by fellow.  His history, exam, plan of care were precepted with me.  Jules Schick MD [facility]

## 2019-11-08 NOTE — Assessment & Plan Note (Signed)
Orthovisc injection 2nd in a series of 3 given today. No complications. Procedure noted below. - Return in one week to have the 3rd and last given.

## 2019-11-08 NOTE — Patient Instructions (Signed)
It was great to see you today! Thank you for letting me participate in your care!  Today, we discussed your right knee pain and you received your 2nd in a series of 3 injections. Please return in one week to have the last injection given.  Be well, Jules Schick, DO PGY-4, Sports Medicine Fellow [facility]

## 2019-11-09 ENCOUNTER — Other Ambulatory Visit (HOSPITAL_COMMUNITY): Payer: Self-pay | Admitting: Cardiology

## 2019-11-10 ENCOUNTER — Ambulatory Visit: Payer: BC Managed Care – PPO | Admitting: Family Medicine

## 2019-11-15 ENCOUNTER — Ambulatory Visit (INDEPENDENT_AMBULATORY_CARE_PROVIDER_SITE_OTHER): Payer: BC Managed Care – PPO | Admitting: Family Medicine

## 2019-11-15 ENCOUNTER — Other Ambulatory Visit: Payer: Self-pay

## 2019-11-15 DIAGNOSIS — G8929 Other chronic pain: Secondary | ICD-10-CM | POA: Diagnosis not present

## 2019-11-15 DIAGNOSIS — M25569 Pain in unspecified knee: Secondary | ICD-10-CM

## 2019-11-15 NOTE — Assessment & Plan Note (Signed)
Orthovisc injection 3rd in a series of 3 given today. No complications. Procedure noted below. - Follow up as needed

## 2019-11-15 NOTE — Patient Instructions (Signed)
Patient declined  

## 2019-11-15 NOTE — Progress Notes (Addendum)
    SUBJECTIVE:   CHIEF COMPLAINT / HPI:   Right knee osteoarthritis: 64 year old male with a history of right knee pain and osteoarthritis who had previously been receiving steroid injections and now presents for third of series of 3 Orthovisc injections. He states he has seen a major difference since the first injection.  PERTINENT  PMH / PSH: Right knee osteoarthritis  OBJECTIVE:   BP (!) 142/88   Ht 6\' 2"  (1.88 m)   Wt 216 lb (98 kg)   BMI 27.73 kg/m   No flowsheet data found.  Knee, Right: Inspection negative for erythema, ecchymosis, effusion.  No obvious bony abnormalities or signs of osteophyte development.  ASSESSMENT/PLAN:   Knee pain Orthovisc injection 3rd in a series of 3 given today. No complications. Procedure noted below. - Follow up as needed     Written and verbal consent was obtained after discussing the risks and benefits of the procedure with the patient. A timeout was performed and the correct site and side was identified and confirmed.  The right knee was cleaned in sterile fashion with alcohol pad. 3 cc 1% Lidocaine was injected using an anterolateral approach using a 5 cc syringe and 25 gauge 1 and 1/2 in needle, then orthovisc was attached to the needle which remained in place and was injected into the right knee joint space. No complications were encountered. Minimal blood loss. A band aid was applied.   , DO [facility], PGY-4  Addendum:  I was the preceptor for this visit and available for immediate consultation.  Jules Schick MD [facility]

## 2019-12-05 ENCOUNTER — Encounter: Payer: Self-pay | Admitting: Medical

## 2019-12-05 ENCOUNTER — Ambulatory Visit (INDEPENDENT_AMBULATORY_CARE_PROVIDER_SITE_OTHER): Payer: BC Managed Care – PPO | Admitting: Medical

## 2019-12-05 ENCOUNTER — Other Ambulatory Visit: Payer: Self-pay

## 2019-12-05 VITALS — BP 158/80 | HR 79 | Ht 74.0 in | Wt 224.0 lb

## 2019-12-05 DIAGNOSIS — R109 Unspecified abdominal pain: Secondary | ICD-10-CM

## 2019-12-05 DIAGNOSIS — E7800 Pure hypercholesterolemia, unspecified: Secondary | ICD-10-CM

## 2019-12-05 DIAGNOSIS — R140 Abdominal distension (gaseous): Secondary | ICD-10-CM | POA: Insufficient documentation

## 2019-12-05 DIAGNOSIS — I10 Essential (primary) hypertension: Secondary | ICD-10-CM

## 2019-12-05 DIAGNOSIS — K9049 Malabsorption due to intolerance, not elsewhere classified: Secondary | ICD-10-CM

## 2019-12-05 DIAGNOSIS — I5022 Chronic systolic (congestive) heart failure: Secondary | ICD-10-CM | POA: Diagnosis not present

## 2019-12-05 DIAGNOSIS — I2510 Atherosclerotic heart disease of native coronary artery without angina pectoris: Secondary | ICD-10-CM | POA: Diagnosis not present

## 2019-12-05 DIAGNOSIS — Z23 Encounter for immunization: Secondary | ICD-10-CM

## 2019-12-05 DIAGNOSIS — K529 Noninfective gastroenteritis and colitis, unspecified: Secondary | ICD-10-CM | POA: Diagnosis not present

## 2019-12-05 DIAGNOSIS — I2583 Coronary atherosclerosis due to lipid rich plaque: Secondary | ICD-10-CM

## 2019-12-05 NOTE — Progress Notes (Signed)
Subjective:  [name] is a 64 y.o. male who presents for Chief Complaint  Patient presents with  . New Patient (Initial Visit)    establish care   . bowel problem    7 stools a day      Prior care with Dr. Sheryn Bison.  Medical team: Multiple GI doctors prior Dr. Peter [place] cardiology Dr. Sherryl Manges, electrophysiology Dr. Amada Jupiter, GI Dr. Arlyce Harman, sports medicine Dr. Aldean Baker, orthopedics Dr. Berniece Salines , Urology Has dentist Hyacinth Meeker vision   Here to establish care.   Has hx/o chronic stool frequency.    For years has had problems with frequent stools.  Has progressively gotten worse.  Currently having 7-9 BMs daily.  stools typically loose but not diarrhea.  Occasionally has blood in stool, bright red.   No problems with hemorrhoids.   Last colonoscopy 2 years ago reportedly normal.  Has tried probiotics.        Uses Nexium, seems to have to rely on this.   Drinks water all the time.  Doesn't do as well if not on Nexium.    No prior EGD.  No prior treatment with xifaxin or viberzi.   Has tried antispasmodics in the past though.    No other aggravating or relieving factors.    No other c/o.  The following portions of the patient's history were reviewed and updated as appropriate: allergies, current medications, past family history, past medical history, past social history, past surgical history and problem list.  ROS Otherwise as in subjective above    Objective: BP (!) 158/80   Pulse 79   Ht 6\' 2"  (1.88 m)   Wt 224 lb (101.6 kg)   SpO2 96%   BMI 28.76 kg/m   General appearance: alert, no distress, well developed, well nourished Neck: supple, no lymphadenopathy, no thyromegaly, no masses Heart: RRR, normal S1, S2, no murmurs Lungs: CTA bilaterally, no wheezes, rhonchi, or rales Abdomen: +bs, soft, non tender, non distended, no masses, no hepatomegaly, no splenomegaly Pulses: 2+ radial pulses, 2+ pedal pulses, normal cap  refill Ext: no edema   Reviewed 2017 colonoscopy from Dr. 2018, multiple biopsies taken, diverticulosis, no polyps, no significant abnormality on pathology report    Assessment: Encounter Diagnoses  Name Primary?  . Chronic diarrhea Yes  . Chronic systolic heart failure (HCC)   . Coronary artery disease due to lipid rich plaque   . Primary hypertension   . Hypercholesterolemia   . Food intolerance   . Abdominal cramping   . Abdominal bloating   . Need for influenza vaccination      Plan: Chronic diarrhea, bloating and cramping - discussed many possible causes.  Reviewed 2017 colonoscopy on file.  discussed possible evaluation steps.  Labs below.   If normal, consider trial of Xifaxin vs Viberzi.   Consider recheck with GI, including EGD and biopsies, h pylori testing.  Discussed diet restrictions, Kombucha or probiotic use.    Counseled on the influenza virus vaccine.  Vaccine information sheet given.   High dose Influenza vaccine given after consent obtained.   Ala was seen today for new patient (initial visit) and bowel problem.  Diagnoses and all orders for this visit:  Chronic diarrhea -     TSH -     Comprehensive metabolic panel -     Celiac Ab tTG DGP TIgA -     Chicken IgE -     Beef IgE -  Food Allergy Profile  Chronic systolic heart failure (HCC)  Coronary artery disease due to lipid rich plaque  Primary hypertension  Hypercholesterolemia  Food intolerance -     TSH -     Comprehensive metabolic panel -     Celiac Ab tTG DGP TIgA -     Chicken IgE -     Beef IgE -     Food Allergy Profile  Abdominal cramping -     TSH -     Comprehensive metabolic panel -     Celiac Ab tTG DGP TIgA -     Chicken IgE -     Beef IgE -     Food Allergy Profile  Abdominal bloating -     TSH -     Comprehensive metabolic panel -     Celiac Ab tTG DGP TIgA -     Chicken IgE -     Beef IgE -     Food Allergy Profile  Need for influenza  vaccination -     Flu Vaccine QUAD High Dose(Fluad)    Follow up: pending labs

## 2019-12-07 ENCOUNTER — Other Ambulatory Visit: Payer: Self-pay | Admitting: Medical

## 2019-12-07 LAB — COMPREHENSIVE METABOLIC PANEL
ALT: 19 IU/L (ref 0–44)
AST: 19 IU/L (ref 0–40)
Albumin/Globulin Ratio: 2.1 (ref 1.2–2.2)
Albumin: 4.6 g/dL (ref 3.8–4.8)
Alkaline Phosphatase: 70 IU/L (ref 44–121)
BUN/Creatinine Ratio: 18 (ref 10–24)
BUN: 16 mg/dL (ref 8–27)
Bilirubin Total: 0.7 mg/dL (ref 0.0–1.2)
CO2: 25 mmol/L (ref 20–29)
Calcium: 9.7 mg/dL (ref 8.6–10.2)
Chloride: 104 mmol/L (ref 96–106)
Creatinine, Ser: 0.88 mg/dL (ref 0.76–1.27)
GFR calc Af Amer: 104 mL/min/{1.73_m2} (ref >59)
GFR calc non Af Amer: 90 mL/min/{1.73_m2} (ref >59)
Globulin, Total: 2.2 g/dL (ref 1.5–4.5)
Glucose: 97 mg/dL (ref 65–99)
Potassium: 4.7 mmol/L (ref 3.5–5.2)
Sodium: 142 mmol/L (ref 134–144)
Total Protein: 6.8 g/dL (ref 6.0–8.5)

## 2019-12-07 LAB — FOOD ALLERGY PROFILE
Allergen Corn, IgE: <0.1 kU/L
Clam IgE: <0.1 kU/L
Codfish IgE: <0.1 kU/L
Egg White IgE: <0.1 kU/L
Milk IgE: <0.1 kU/L
Peanut IgE: <0.1 kU/L
Scallop IgE: <0.1 kU/L
Sesame Seed IgE: <0.1 kU/L
Shrimp IgE: <0.1 kU/L
Soybean IgE: <0.1 kU/L
Walnut IgE: <0.1 kU/L
Wheat IgE: <0.1 kU/L

## 2019-12-07 LAB — CELIAC AB TTG DGP TIGA
Antigliadin Abs, IgA: 3 units (ref 0–19)
Gliadin IgG: 2 units (ref 0–19)
IgA/Immunoglobulin A, Serum: 140 mg/dL (ref 61–437)
Tissue Transglut Ab: <2 U/mL (ref 0–5)
Transglutaminase IgA: <2 U/mL (ref 0–3)

## 2019-12-07 LAB — ALLERGEN BEEF: Beef IgE: <0.1 kU/L

## 2019-12-07 LAB — ALLERGEN, CHICKEN F83: Chicken IgE: <0.1 kU/L

## 2019-12-07 LAB — TSH: TSH: 2.55 u[IU]/mL (ref 0.450–4.500)

## 2019-12-07 MED ORDER — VIBERZI 75 MG PO TABS: 1.0000 | ORAL_TABLET | Freq: Every day | ORAL | 2 refills | Status: DC

## 2019-12-11 ENCOUNTER — Encounter (HOSPITAL_COMMUNITY): Payer: Self-pay | Admitting: Cardiology

## 2019-12-11 ENCOUNTER — Other Ambulatory Visit: Payer: Self-pay | Admitting: Cardiology

## 2019-12-11 ENCOUNTER — Other Ambulatory Visit (HOSPITAL_COMMUNITY): Payer: Self-pay | Admitting: Cardiology

## 2019-12-11 ENCOUNTER — Other Ambulatory Visit: Payer: Self-pay | Admitting: Medical

## 2019-12-11 MED ORDER — ZOLPIDEM TARTRATE 10 MG PO TABS: 10.0000 mg | ORAL_TABLET | Freq: Every evening | ORAL | 1 refills | Status: DC | PRN

## 2020-01-08 ENCOUNTER — Other Ambulatory Visit (HOSPITAL_COMMUNITY): Payer: Self-pay | Admitting: Cardiology

## 2020-01-11 ENCOUNTER — Encounter: Payer: Self-pay | Admitting: Medical

## 2020-01-11 ENCOUNTER — Ambulatory Visit (INDEPENDENT_AMBULATORY_CARE_PROVIDER_SITE_OTHER): Payer: BC Managed Care – PPO | Admitting: Medical

## 2020-01-11 ENCOUNTER — Other Ambulatory Visit: Payer: Self-pay

## 2020-01-11 VITALS — BP 132/70 | HR 83 | Ht 74.0 in | Wt 223.8 lb

## 2020-01-11 DIAGNOSIS — Z7185 Encounter for immunization safety counseling: Secondary | ICD-10-CM | POA: Insufficient documentation

## 2020-01-11 DIAGNOSIS — F419 Anxiety disorder, unspecified: Secondary | ICD-10-CM | POA: Insufficient documentation

## 2020-01-11 DIAGNOSIS — G4700 Insomnia, unspecified: Secondary | ICD-10-CM | POA: Diagnosis not present

## 2020-01-11 DIAGNOSIS — M549 Dorsalgia, unspecified: Secondary | ICD-10-CM | POA: Diagnosis not present

## 2020-01-11 DIAGNOSIS — Z87440 Personal history of urinary (tract) infections: Secondary | ICD-10-CM | POA: Diagnosis not present

## 2020-01-11 DIAGNOSIS — K529 Noninfective gastroenteritis and colitis, unspecified: Secondary | ICD-10-CM

## 2020-01-11 DIAGNOSIS — R1110 Vomiting, unspecified: Secondary | ICD-10-CM

## 2020-01-11 LAB — POCT URINALYSIS DIP (PROADVANTAGE DEVICE)
Bilirubin, UA: NEGATIVE
Blood, UA: NEGATIVE
Glucose, UA: NEGATIVE mg/dL
Ketones, POC UA: NEGATIVE mg/dL
Nitrite, UA: NEGATIVE
Protein Ur, POC: NEGATIVE mg/dL
Specific Gravity, Urine: 1.015
Urobilinogen, Ur: 0.2
pH, UA: 6 (ref 5.0–8.0)

## 2020-01-11 MED ORDER — BELSOMRA 15 MG PO TABS: 1.0000 | ORAL_TABLET | Freq: Every day | ORAL | 0 refills | Status: DC

## 2020-01-11 MED ORDER — RIFAXIMIN 550 MG PO TABS: 550.0000 mg | ORAL_TABLET | Freq: Three times a day (TID) | ORAL | 0 refills | Status: DC

## 2020-01-11 NOTE — Patient Instructions (Signed)
Counseling Services (NON- psychiatrist offices)  Dr. Nicole Cella, [place] [phone] [address] [place], [place] [address]   [facility] [phone]  [email or web address] [address] [place], [place], [place] [address]    Lenise Arena. Charlyne Mom, therapist [phone] [address] [place], [place] [address]   [facility] [phone] [address], [place], [place] [address]

## 2020-01-11 NOTE — Progress Notes (Signed)
Subjective:  [name] is a 65 y.o. male who presents for Chief Complaint  Patient presents with  . Follow-up    Stopped taking viberzi due to being unable to use the restroom.      Here for follow-up.  I saw him recently as a new patient  Last visit we discussed chronic diarrhea.  Here to review labs from last visit.  He did a trial of Viberzi.  After about 14 days on Viberzi which completely cleared up the diarrhea, but unfortunately he did not have a bowel movement for about 8 days.  Despite attempts at the Viberzi it really interferes with him having a bowel movement at all compared to the chronic diarrhea before.  Last visit we discussed his prior colonoscopy, prior other treatments.  No blood in the stool.  He actually just resigned from his job due to the disruption that his diarrhea causes  He has even tried stopping some of his medicines before for periods of 2 weeks at a time to see if that would change the diarrhea and none of his other medicines seem to change whether he has loose stool or not  Gout listed in the chart record but he notes no gout problems for a long time  He does have a history of kidney stones in the past and history of urinary tract infection periodically.  Recently he has had some backache and has been drinking cranberry juice which has helped he wanted to check the urine just to make sure no obvious UTI.  The back pain has calmed down  Here to discuss history of insomnia, long-term Ambien use.  He has tried melatonin and trazodone in the past with no benefit.  Ambien does not always help either.  He still wakes up often in the night despite using Ambien.  He notes that his brain does not shut down the lies in the bed thinking about all sorts of things and this is always been the case.  He did CBT therapy years ago.  He has had 2 sleep studies in the past, last one many years ago and both were inconclusive per his report.  No history of restless leg syndrome or  movement disorder.  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  No history of bipolar, no depression, no major mood swings.  He is married.  No other aggravating or relieving factors.    No other c/o.  Past Medical History:  Diagnosis Date  . Anxiety   . Arthritis    "mild; back, neck, spine" (06/09/2017)  . CHF (congestive heart failure) (HCC)   . Chronic back pain   . Coronary artery disease   . History of gout   . History of kidney stones X 2  . Hypercholesterolemia   . Hypertension   . LBBB (left bundle branch block)   . MI (myocardial infarction) (HCC) 1993   LATERAL  . MI (myocardial infarction) (HCC) ?09/2001; 06/07/2017  . Pneumonia    "several times" (06/09/2017)  . S/P coronary artery stent placement    RCA  . Varicose veins      The following portions of the patient's history were reviewed and updated as appropriate: allergies, current medications, past family history, past medical history, past social history, past surgical history and problem list.  ROS Otherwise as in subjective above  Objective: BP 132/70   Pulse 83   Ht 6\' 2"  (1.88 m)   Wt 223 lb 12.8 oz (101.5 kg)  SpO2 94%   BMI 28.73 kg/m   General appearance: alert, no distress, well developed, well nourished Psych: Pleasant, answers question appropriate, good eye contact   Assessment: Encounter Diagnoses  Name Primary?  . Chronic diarrhea Yes  . Insomnia, unspecified type   . Back pain, unspecified back location, unspecified back pain laterality, unspecified chronicity   . History of urinary tract infection   . Vaccine counseling   . Rumination   . Anxiety      Plan: Chronic diarrhea- I reviewed his chart notes from last visit which was his first visit here.  We checked numerous labs including comprehensive metabolic panel, thyroid, celiac screen and food allergy panels with no significant abnormalities.  I reviewed his colonoscopy report from 2017.  He did a trial of Viberzi but instead of having  loose stools this caused him to not have a bowel movement at all.  At this point we will try a 14-day round of Xifaxan for chronic diarrhea and IBS.  We discussed that this could be repeated a couple times if it is helping.  If no benefit consider trial of hyoscyamine or other antispasmodic.  Ultimately if no improvement we can get back in with gastroenterology  Back pain-possibly musculoskeletal.  Urinalysis reviewed today since he has a history of UTI in the past, urine culture sent.  Advised good hydration, stretching, routine exercise, Tylenol as needed  Rumination, anxiety-advised counseling.  I printed a list of counselors for him to inquire  Insomnia-discussed his chronic insomnia, chronic use of Ambien.  He is failed trazodone in the past as well as not seeing complete improvement with Ambien.  I recommended he go back to counseling for help with anxiety, rumination and consider CBT therapy or other therapies.  He is willing to do a trial of Belsomra if this is covered by insurance.  We discussed the risk and benefits of Ambien in I would like to get him off of this.  Vaccine counseling-she will give [place] a copy of his COVID-vaccine.  We will request records from drug study a few years ago from where he did a pneumonia vaccine study.  He is likely up-to-date on pneumococcal vaccine.  Shingles vaccine:  I recommend you have a shingles vaccine to help prevent shingles or herpes zoster outbreak.   Please call your insurer to inquire about coverage for the Shingrix vaccine given in 2 doses.   Some insurers cover this vaccine after age 19, some cover this after age 49.  If your insurer covers this, then call to schedule appointment to have this vaccine here.    [place]i was seen today for follow-up.  Diagnoses and all orders for this visit:  Chronic diarrhea  Insomnia, unspecified type  Back pain, unspecified back location, unspecified back pain laterality, unspecified chronicity -     POCT  Urinalysis DIP (Proadvantage Device) -     Urine Culture  History of urinary tract infection -     POCT Urinalysis DIP (Proadvantage Device) -     Urine Culture  Vaccine counseling  Rumination  Anxiety  Other orders -     rifaximin (XIFAXAN) 550 MG TABS tablet; Take 1 tablet (550 mg total) by mouth 3 (three) times daily. -     Suvorexant (BELSOMRA) 15 MG TABS; Take 1 tablet by mouth at bedtime.  Spent 46 minutes face to face with patient in discussion of symptoms, evaluation, plan and recommendations.    Follow up: pending medication trial, 2 wk

## 2020-01-12 ENCOUNTER — Encounter: Payer: Self-pay | Admitting: Medical

## 2020-01-13 LAB — URINE CULTURE

## 2020-01-15 ENCOUNTER — Other Ambulatory Visit: Payer: Self-pay | Admitting: Medical

## 2020-01-15 MED ORDER — SULFAMETHOXAZOLE-TRIMETHOPRIM 800-160 MG PO TABS: 1.0000 | ORAL_TABLET | Freq: Two times a day (BID) | ORAL | 0 refills | Status: DC

## 2020-01-16 ENCOUNTER — Other Ambulatory Visit: Payer: Self-pay | Admitting: Medical

## 2020-01-17 ENCOUNTER — Other Ambulatory Visit (HOSPITAL_COMMUNITY): Payer: Self-pay | Admitting: Cardiology

## 2020-01-18 ENCOUNTER — Ambulatory Visit (INDEPENDENT_AMBULATORY_CARE_PROVIDER_SITE_OTHER): Payer: BC Managed Care – PPO

## 2020-01-18 DIAGNOSIS — I255 Ischemic cardiomyopathy: Secondary | ICD-10-CM | POA: Diagnosis not present

## 2020-01-18 LAB — CUP PACEART REMOTE DEVICE CHECK
Battery Remaining Longevity: 30 mo
Battery Voltage: 2.95 V
Brady Statistic AP VP Percent: 0.19 %
Brady Statistic AP VS Percent: 0.06 %
Brady Statistic AS VP Percent: 99.5 %
Brady Statistic AS VS Percent: 0.25 %
Brady Statistic RA Percent Paced: 0.25 %
Brady Statistic RV Percent Paced: 98.84 %
Date Time Interrogation Session: 20220113033423
HighPow Impedance: 86 Ohm
Implantable Lead Implant Date: 20190610
Implantable Lead Implant Date: 20200306
Implantable Lead Implant Date: 20200306
Implantable Lead Location: 753858
Implantable Lead Location: 753859
Implantable Lead Location: 753860
Implantable Lead Model: 5071
Implantable Lead Model: 5076
Implantable Pulse Generator Implant Date: 20200306
Lead Channel Impedance Value: 323 Ohm
Lead Channel Impedance Value: 380 Ohm
Lead Channel Impedance Value: 4047 Ohm
Lead Channel Impedance Value: 4047 Ohm
Lead Channel Impedance Value: 437 Ohm
Lead Channel Impedance Value: 456 Ohm
Lead Channel Pacing Threshold Amplitude: 0.625 V
Lead Channel Pacing Threshold Amplitude: 0.75 V
Lead Channel Pacing Threshold Amplitude: 2 V
Lead Channel Pacing Threshold Pulse Width: 0.4 ms
Lead Channel Pacing Threshold Pulse Width: 0.4 ms
Lead Channel Pacing Threshold Pulse Width: 1 ms
Lead Channel Sensing Intrinsic Amplitude: 3.5 mV
Lead Channel Sensing Intrinsic Amplitude: 3.5 mV
Lead Channel Sensing Intrinsic Amplitude: 31.625 mV
Lead Channel Sensing Intrinsic Amplitude: 31.625 mV
Lead Channel Setting Pacing Amplitude: 1.5 V
Lead Channel Setting Pacing Amplitude: 2.5 V
Lead Channel Setting Pacing Amplitude: 3 V
Lead Channel Setting Pacing Pulse Width: 0.4 ms
Lead Channel Setting Pacing Pulse Width: 1 ms
Lead Channel Setting Sensing Sensitivity: 0.6 mV

## 2020-01-20 ENCOUNTER — Telehealth: Payer: Self-pay

## 2020-01-20 NOTE — Telephone Encounter (Signed)
P.AMarcellus Scott, preferred are generic Lunesta & [id number]

## 2020-01-25 NOTE — Telephone Encounter (Signed)
P.A. denied, pt must try 2 preferred Eszopiclone or Zaleplon or Zolpidem.  Pt has tried Zolpidem, do you want to switch to one of the other covered alternatives?

## 2020-01-26 NOTE — Telephone Encounter (Signed)
This is so frustrating.  See if he is agreeable to trial of one of the others, or we can just stick with what he has

## 2020-01-29 ENCOUNTER — Other Ambulatory Visit: Payer: Self-pay | Admitting: Medical

## 2020-01-29 MED ORDER — ESZOPICLONE 2 MG PO TABS: 2.0000 mg | ORAL_TABLET | Freq: Every day | ORAL | 0 refills | Status: DC

## 2020-01-29 NOTE — Telephone Encounter (Signed)
Please call in [name] as he is willing to try this.   Of note something called the Beers List is a list of medicine was deemed to be risky for folks older than 65 at risk for falls.  Thus we tend to stay away from medicines that can make one sedated or unsteady on her feet which includes a lot of sleep aids and medicines like Xanax.  This is the reason I want to get him off of Ambien along with the habit-forming risk of Ambien.  Let it be noted that the insurance is making a strength through all these hurdles just to get Belsomra covered by insurance which is thought to be a much safer option and less risky for falls and habit-forming medication.  So I think is ridiculous we have to do a trial of another medicine similar to Ambien such as Lunesta or Sonata before they will cover Belsomra.   Nevertheless, please call in the [facility]

## 2020-01-29 NOTE — Telephone Encounter (Signed)
Called pt and he will research both of these and call back which one he prefers

## 2020-01-29 NOTE — Telephone Encounter (Signed)
Pt says he wants to try generic Lunesta please send to [facility]

## 2020-01-31 ENCOUNTER — Telehealth: Payer: Self-pay | Admitting: Family Medicine

## 2020-01-31 ENCOUNTER — Encounter: Payer: Self-pay | Admitting: Family Medicine

## 2020-01-31 ENCOUNTER — Other Ambulatory Visit: Payer: Self-pay

## 2020-01-31 ENCOUNTER — Other Ambulatory Visit (INDEPENDENT_AMBULATORY_CARE_PROVIDER_SITE_OTHER): Payer: BC Managed Care – PPO

## 2020-01-31 DIAGNOSIS — Z87440 Personal history of urinary (tract) infections: Secondary | ICD-10-CM

## 2020-01-31 LAB — POCT URINALYSIS DIP (PROADVANTAGE DEVICE)
Bilirubin, UA: NEGATIVE
Blood, UA: NEGATIVE
Glucose, UA: NEGATIVE mg/dL
Leukocytes, UA: NEGATIVE
Nitrite, UA: NEGATIVE
Specific Gravity, Urine: 1.025
Urobilinogen, Ur: 0.2
pH, UA: 5.5 (ref 5.0–8.0)

## 2020-01-31 NOTE — Telephone Encounter (Signed)
Have him try 1-1/2 tablet nightly for the next few nights of the Lunesta  There is a whole back story on this -I was trying to get him off Ambien so we were trying some other medications per insurance requirements before we can get certain things approved.  If he does not see improvement over the next few nights 1.5 tablets per night then we will consider this a failed trial  When he calls back he can ask for Vernona Rieger to let her know if is not working so we can move on to getting something else approved that is more reasonable or safer

## 2020-01-31 NOTE — Progress Notes (Signed)
Remote ICD transmission.   

## 2020-01-31 NOTE — Telephone Encounter (Signed)
PT has been on new sleep meds for 2 nights.  So far he has gotten 1 hour 15 minutes of sleep.  Do you want to increase this one or do something different?  Please call pt [phone]

## 2020-01-31 NOTE — Progress Notes (Signed)
Patient called and requested a letter of medical necessity for his gel injections. Letter placed in patient chart today. Appartenly, [employer] told him they would not pay for his gel injection unless he was provided a letter of medical necessity which I am writing today.

## 2020-02-01 NOTE — Telephone Encounter (Signed)
Called pt and advised of Shane's instructions.  He said his insurance said their preferred is Ambien/Zofran.  I advised him if this doesn't work to call and ask for Vernona Rieger.

## 2020-02-03 NOTE — Progress Notes (Signed)
Cardiology Office Note:    Date:  02/05/2020   ID:  [name], DOB 21-Oct-1955, MRN [id number]  PCP:  [name], PA-C  Cardiologist:  Habeeb Puertas [place], MD  Electrophysiologist:  None   Referring MD: [name], PA-C   Chief Complaint  Patient presents with  . Coronary Artery Disease    History of Present Illness:    [name] is a 65 y.o. male with a hx of CAD s/p CABG, hypertension, left bundle branch block, hyperlipidemia, and chronic systolic heart failure.  Patient was admitted in June 2019 with flash pulmonary edema.  He was ruled in for NSTEMI and underwent cardiac catheterization which showed severe three-vessel CAD. He underwent CABG by Dr Donata Clay with LIMA to LAD, SVG to diagonal, SVG to ramus intermediate, and SVG to RCA.  He had a syncope in June 2019 while waiting in the neurology office due to urethral stricture.  He was noted to have wide-complex tachycardia on telemetry with heart rate of 170.  EF was 35 to 40%.  Lower extremity Doppler was negative for DVT.  CT of the chest was negative for PE.  He was seen by EP, the underlying rhythm could be either VT versus NSVT.  He was fitted with LifeVest.  Medication was cut back due to mild hypotension and orthostasis.  Repeat echocardiogram in September 2019 showed persistently low EF of 25 to 30%.  He was unable to tolerate Entresto, spironolactone and carvedilol due to orthostatic symptom and the lightheadedness.  Patient eventually underwent BIV ICD implantation in March 2020 by Dr. Graciela Husbands after failing another trial of medical therapy.  Since then, repeat echocardiogram in July 2020 did show significant improvement in EF to 40 to 45%.  On follow up today he is doing very well from a cardiac standpoint. He is now retired. He still has a pain in the left precordium when he goes to bed at night. Uses a frozen bottle on it to help sleep. He is exercising regularly riding a bike or doing elliptical 20 days a month.  Notes exercising in the cold really gets to him. No dizziness or syncope. Has a lot of issues with IBS. Still not sleeping well.    Past Medical History:  Diagnosis Date  . Anxiety   . Arthritis    "mild; back, neck, spine" (06/09/2017)  . CHF (congestive heart failure) (HCC)   . Chronic back pain   . Coronary artery disease   . History of gout   . History of kidney stones X 2  . Hypercholesterolemia   . Hypertension   . LBBB (left bundle branch block)   . MI (myocardial infarction) (HCC) 1993   LATERAL  . MI (myocardial infarction) (HCC) ?09/2001; 06/07/2017  . Pneumonia    "several times" (06/09/2017)  . S/P coronary artery stent placement    RCA  . Varicose veins     Past Surgical History:  Procedure Laterality Date  . ANTERIOR CERVICAL DECOMP/DISCECTOMY FUSION  03/03/2011   Procedure: ANTERIOR CERVICAL DECOMPRESSION/DISCECTOMY FUSION 3 LEVELS;  Surgeon: Karn Cassis, MD;  Location: [facility]URO ORS;  Service: Neurosurgery;  Laterality: N/A;  Cervical three-four Cervical four-five Cervical five-six Anterior cervical decompression/diskectomy, fusion  . BACK SURGERY    . CARDIAC CATHETERIZATION  2009   Stents in RCA patent. 70 to 80% PL, and 50% ostial PD.   Marland Kitchen CARDIAC CATHETERIZATION N/A 11/20/2014   Procedure: Left Heart Cath and Coronary Angiography;  Surgeon: Demetria Pore  [place], MD;  Location: [facility] INVASIVE CV LAB;  Service: Cardiovascular;  Laterality: N/A;  . CORONARY ANGIOPLASTY     DIRECT ANGIOPLASTY THE MARGINAL BRANCH  . CORONARY ANGIOPLASTY WITH STENT PLACEMENT     RCA  . CORONARY ARTERY BYPASS GRAFT N/A 06/14/2017   Procedure: CORONARY ARTERY BYPASS GRAFTING (CABG) x four, using left internal mammary artery and right leg greater saphenous vein harvested endoscopically;  Surgeon: Kerin Perna, MD;  Location: [facility] OR;  Service: Open Heart Surgery;  Laterality: N/A;  . CYSTOSCOPY N/A 06/14/2017   Procedure: CYSTOSCOPY;  Surgeon: Donata Clay, Theron Arista, MD;  Location: [facility] OR;   Service: Open Heart Surgery;  Laterality: N/A;  . ICD IMPLANT N/A 03/11/2018   Procedure: ICD IMPLANT - Dual Chamber;  Surgeon: [name], MD;  Location: [facility] INVASIVE CV LAB;  Service: Cardiovascular;  Laterality: N/A;  . INSERTION OF SUPRAPUBIC CATHETER N/A 06/14/2017   Procedure: INSERTION OF SUPRAPUBIC CATHETER - Lower abdomen;  Surgeon: Kerin Perna, MD;  Location: [facility] OR;  Service: Open Heart Surgery;  Laterality: N/A;  . POSTERIOR LUMBAR FUSION  10/2011  . RIGHT/LEFT HEART CATH AND CORONARY ANGIOGRAPHY N/A 06/08/2017   Procedure: RIGHT/LEFT HEART CATH AND CORONARY ANGIOGRAPHY;  Surgeon: [place], Zareah Hunzeker M, MD;  Location: [facility] INVASIVE CV LAB;  Service: Cardiovascular;  Laterality: N/A;  . TEE WITHOUT CARDIOVERSION N/A 06/14/2017   Procedure: TRANSESOPHAGEAL ECHOCARDIOGRAM (TEE);  Surgeon: Donata Clay, Theron Arista, MD;  Location: [facility] OR;  Service: Open Heart Surgery;  Laterality: N/A;  . URETHROPLASTY N/A 10/15/2017   Procedure: URETHROPLASTY WITH BUCCAL GRAFT HARVAST;  Surgeon: Crist Fat, MD;  Location: [facility] ORS;  Service: Urology;  Laterality: N/A;    Current Medications: Current Meds  Medication Sig  . acetaminophen (TYLENOL) 500 MG tablet Take 1,000 mg by mouth 2 (two) times daily.  Marland Kitchen aspirin 81 MG EC tablet Take 81 mg by mouth daily.   . Eluxadoline (VIBERZI) 75 MG TABS Take 1 tablet by mouth daily.  Marland Kitchen ENTRESTO 24-26 MG TAKE 1/2 TABLET BY MOUTH 2 TIMES DAILY  . esomeprazole (NEXIUM 24HR) 20 MG capsule Take 1 capsule (20 mg total) by mouth daily.  . eszopiclone (LUNESTA) 2 MG TABS tablet Take 1 tablet (2 mg total) by mouth at bedtime. Take immediately before bedtime  . fenofibrate (TRICOR) 145 MG tablet TAKE 1 TABLET BY MOUTH EVERY DAY  . Multiple Vitamins-Minerals (MENS MULTIVITAMIN PLUS PO) Take 1 tablet by mouth 2 (two) times daily.  Marland Kitchen OVER THE COUNTER MEDICATION Take 2 capsules by mouth at bedtime. Deep Sleep  . Probiotic Product (PROBIOTIC PO) Take 1 capsule by mouth 2 (two) times  daily.  Marland Kitchen REPATHA SURECLICK 140 MG/ML SOAJ INJECT INTO SKIN EVERY 14 DAYS  . rifaximin (XIFAXAN) 550 MG TABS tablet Take 1 tablet (550 mg total) by mouth 3 (three) times daily.  Marland Kitchen sulfamethoxazole-trimethoprim (BACTRIM DS) 800-160 MG tablet Take 1 tablet by mouth 2 (two) times daily.  . [DISCONTINUED] carvedilol (COREG) 6.25 MG tablet Take 1 tablet (6.25 mg total) by mouth 2 (two) times daily with a meal. Must have office visit for additional refills [phone]  . [DISCONTINUED] zolpidem (AMBIEN) 10 MG tablet Take 1 tablet (10 mg total) by mouth at bedtime as needed. for sleep     Allergies:   Ezetimibe, Niacin, and Statins   Social History   Socioeconomic History  . Marital status: Married    Spouse name: Not on file  . Number of children: 1  . Years of  education: Not on file  . Highest education level: Not on file  Occupational History  . Occupation: Truck Airline pilot  Tobacco Use  . Smoking status: Former Smoker    Packs/day: 2.00    Years: 20.00    Pack years: 40.00    Types: Cigarettes    Quit date: 05/07/1999    Years since quitting: 20.7  . Smokeless tobacco: Never Used  Vaping Use  . Vaping Use: Never used  Substance and Sexual Activity  . Alcohol use: Yes    Alcohol/week: 1.0 standard drink    Types: 1 Cans of beer per week  . Drug use: Never  . Sexual activity: Not on file  Other Topics Concern  . Not on file  Social History Narrative   Married, works in Airline pilot for large trucks.   11/2019.   Social Determinants of Health   Financial Resource Strain: Not on file  Food Insecurity: Not on file  Transportation Needs: Not on file  Physical Activity: Not on file  Stress: Not on file  Social Connections: Not on file     Family History: The patient's family history includes Breast cancer in his mother; Coronary artery disease in his mother; Diabetes in his father; Heart attack in his father and maternal grandfather; Heart disease in his brother.  ROS:   Please see  the history of present illness.     All other systems reviewed and are negative.  EKGs/Labs/Other Studies Reviewed:    The following studies were reviewed today:  Echo 07/11/2018 1. The left ventricle has mild-moderately reduced systolic function, with an ejection fraction of 40-45%. The cavity size was normal. Left ventricular diastolic Doppler parameters are indeterminate. Indeterminate filling pressures.  2. The right ventricle has normal systolic function. The cavity was normal. There is no increase in right ventricular wall thickness. Right ventricular systolic pressure could not be assessed.  3. Left atrial size was mildly dilated.  4. Aortic valve regurgitation is mild to moderate by color flow Doppler. No stenosis of the aortic valve.  5. The Ascending aorta measures 39 mm, within normal limits when indexed to body surface area.  6. When compared to the prior study: 01/21/2018- LV ejection fraction has improved. Ventricular septal contraction is less dysynchronous. Aortic valve regurgitation has not significantly changed. Side by side comparison of images performed.   EKG:  EKG is ordered today.  The ekg ordered today demonstrates atrial sensed ventricularly paced rhythm. BiV.   Recent Labs: 12/05/2019: ALT 19; BUN 16; Creatinine, Ser 0.88; Potassium 4.7; Sodium 142; TSH 2.550  Recent Lipid Panel    Component Value Date/Time   CHOL 120 01/02/2019 0923   TRIG 236 (H) 01/02/2019 0923   HDL 43 01/02/2019 0923   CHOLHDL 2.8 01/02/2019 0923   CHOLHDL 2.6 10/29/2017 0939   VLDL 27 10/29/2017 0939   LDLCALC 40 01/02/2019 0923    Physical Exam:    VS:  BP 128/82   Pulse 77   Ht 6\' 2"  (1.88 m)   Wt 226 lb 3.2 oz (102.6 kg)   SpO2 95%   BMI 29.04 kg/m     Wt Readings from Last 3 Encounters:  02/05/20 226 lb 3.2 oz (102.6 kg)  01/11/20 223 lb 12.8 oz (101.5 kg)  12/05/19 224 lb (101.6 kg)     GEN:  Well nourished, well developed in no acute distress HEENT: Normal NECK: No  JVD; No carotid bruits LYMPHATICS: No lymphadenopathy CARDIAC: RRR, no murmurs, rubs, gallops. + pain on  palpation left precordium. RESPIRATORY:  Clear to auscultation without rales, wheezing or rhonchi  ABDOMEN: Soft, non-tender, non-distended MUSCULOSKELETAL:  No edema; No deformity  SKIN: Warm and dry NEUROLOGIC:  Alert and oriented x 3 PSYCHIATRIC:  Normal affect   ASSESSMENT:    1. Coronary artery disease of bypass graft of native heart with stable angina pectoris (HCC)   2. S/P CABG x 4   3. Ischemic cardiomyopathy   4. LBBB (left bundle branch block)   5. Chronic systolic heart failure (HCC)   6. Essential hypertension   7. Hyperlipidemia, unspecified hyperlipidemia type    PLAN:    In order of problems listed above:  1. CAD s/p CABG 2019: no angina at this time.  Continue Aspirin and carvedilol.  Still has residual left precordial musculoskeletal pain from his surgery.  2. Hyperlipidemia: On Repatha.  lipids excellent.  3. Hypertension: Blood pressure  is well controlled.  4. Chronic systolic heart failure: Euvolemic on today's exam. Last EF 40-45% On appropriate heart failure regimen include carvedilol and Entresto.  5. Status post biventricular ICD: Followed by EP.   recent device interrogation did not mention any ventricular tachycardia.   6. GERD     Medication Adjustments/Labs and Tests Ordered: Current medicines are reviewed at length with the patient today.  Concerns regarding medicines are outlined above.  Orders Placed This Encounter  Procedures  . EKG 12-Lead   Meds ordered this encounter  Medications  . carvedilol (COREG) 6.25 MG tablet    Sig: Take 0.5 tablets (3.125 mg total) by mouth 2 (two) times daily with a meal. Must have office visit for additional refills [phone]    Dispense:  60 tablet    Refill:  0    There are no Patient Instructions on file for this visit.   Signed, Giovanny Dugal [place], MD  02/05/2020 11:23 AM    [facility]

## 2020-02-05 ENCOUNTER — Encounter: Payer: Self-pay | Admitting: Cardiology

## 2020-02-05 ENCOUNTER — Ambulatory Visit (INDEPENDENT_AMBULATORY_CARE_PROVIDER_SITE_OTHER): Payer: BC Managed Care – PPO | Admitting: Cardiology

## 2020-02-05 ENCOUNTER — Other Ambulatory Visit: Payer: Self-pay

## 2020-02-05 VITALS — BP 128/82 | HR 77 | Ht 74.0 in | Wt 226.2 lb

## 2020-02-05 DIAGNOSIS — I255 Ischemic cardiomyopathy: Secondary | ICD-10-CM | POA: Diagnosis not present

## 2020-02-05 DIAGNOSIS — E785 Hyperlipidemia, unspecified: Secondary | ICD-10-CM

## 2020-02-05 DIAGNOSIS — I5022 Chronic systolic (congestive) heart failure: Secondary | ICD-10-CM

## 2020-02-05 DIAGNOSIS — I10 Essential (primary) hypertension: Secondary | ICD-10-CM

## 2020-02-05 DIAGNOSIS — I447 Left bundle-branch block, unspecified: Secondary | ICD-10-CM | POA: Diagnosis not present

## 2020-02-05 DIAGNOSIS — I25708 Atherosclerosis of coronary artery bypass graft(s), unspecified, with other forms of angina pectoris: Secondary | ICD-10-CM | POA: Diagnosis not present

## 2020-02-05 DIAGNOSIS — Z951 Presence of aortocoronary bypass graft: Secondary | ICD-10-CM | POA: Diagnosis not present

## 2020-02-05 MED ORDER — CARVEDILOL 6.25 MG PO TABS: 3.1250 mg | ORAL_TABLET | Freq: Two times a day (BID) | ORAL | 0 refills | Status: DC

## 2020-02-07 ENCOUNTER — Other Ambulatory Visit: Payer: Self-pay | Admitting: Cardiology

## 2020-02-08 ENCOUNTER — Telehealth: Payer: Self-pay | Admitting: Medical

## 2020-02-08 NOTE — Telephone Encounter (Signed)
Pt called and states that he is still not sleeping with the lunesta states he is taking a pill and a half, he is only sleeping like 4-5 hours, states with the [place] he was sleeping 7-8 hours  A night pt uses [facility] - [place], [place] - [address] Pt can be reached at [phone] Pt states he would keep using the lunesta if you think that is best

## 2020-02-08 NOTE — Telephone Encounter (Signed)
I am sending this to Ronnald Ramp.   I think we have enough failures to now warrant authorization of Belsomra.   In the meantime have him go back to Ambien.  I believe he still has some left.

## 2020-02-09 ENCOUNTER — Other Ambulatory Visit: Payer: Self-pay | Admitting: Medical

## 2020-02-09 MED ORDER — ZOLPIDEM TARTRATE 10 MG PO TABS: 10.0000 mg | ORAL_TABLET | Freq: Every evening | ORAL | 1 refills | Status: DC | PRN

## 2020-02-09 NOTE — Telephone Encounter (Addendum)
Patient stated he 4 ambiens left. Please comment on urine result 01/31/20.

## 2020-02-09 NOTE — Telephone Encounter (Signed)
Patient informed. 

## 2020-02-09 NOTE — Telephone Encounter (Signed)
Ok, it looks improved.  No obvious infection.

## 2020-02-09 NOTE — Telephone Encounter (Signed)
I sent the Ambien.  Please go back and look at the lab result message.  He had a urinary tract infection proven by culture and I had sent out Bactrim antibiotic we got the result.  I am assuming he has taken the antibiotic at this point?  Is he still having urinary symptoms?Marland Kitchen

## 2020-02-09 NOTE — Telephone Encounter (Signed)
Resubmitted P.A. Belsomra and now going thru as pt has done all necessary trials  Called pharmacy and went thru for $161 (Ambien is only $4)  Activated discount card and called pharmacy back and went thru for $30, they are ordering and will be in on Monday.  I called and left message for patient.

## 2020-02-09 NOTE — Telephone Encounter (Deleted)
Please comment on UA done 01/31/20

## 2020-02-09 NOTE — Telephone Encounter (Signed)
I took care of the P.A. Belsomra & it was approved went thru for $161, activated discount card brought it down to $30, pharmacy is ordering the Belsomra and it will be in on Monday.  I called pt and left message.

## 2020-02-09 NOTE — Telephone Encounter (Deleted)
Resubmitted P.A. Belsomra and now going thru as pt has done all necessary trials  Called pharmacy and went thru for $161 (Ambien is only $4)  Activated discount card and called pharmacy back and went thru for $30, they are ordering and will be in on Monday.  I called and left message for patient.   

## 2020-02-09 NOTE — Telephone Encounter (Signed)
Patient took meds and returned on 01/31/20 and did a repeat urine clean catch. It was a nurse visit.

## 2020-03-04 ENCOUNTER — Other Ambulatory Visit: Payer: Self-pay | Admitting: Medical

## 2020-03-21 ENCOUNTER — Telehealth: Payer: Self-pay

## 2020-03-21 NOTE — Telephone Encounter (Signed)
Pt. Called stating he wanted to let you know that the different sleep aid you called in for him didn't work at all. The only medication that works for him is Ambien he just wanted to let you know. I let him know that he has refills at the pharmacy for Ambien.

## 2020-03-27 ENCOUNTER — Other Ambulatory Visit: Payer: Self-pay | Admitting: Cardiology

## 2020-03-27 ENCOUNTER — Other Ambulatory Visit: Payer: Self-pay | Admitting: Medical

## 2020-03-27 ENCOUNTER — Telehealth: Payer: Self-pay | Admitting: Medical

## 2020-03-27 DIAGNOSIS — Z9889 Other specified postprocedural states: Secondary | ICD-10-CM | POA: Diagnosis not present

## 2020-03-27 DIAGNOSIS — M5416 Radiculopathy, lumbar region: Secondary | ICD-10-CM | POA: Diagnosis not present

## 2020-03-27 DIAGNOSIS — M25551 Pain in right hip: Secondary | ICD-10-CM | POA: Diagnosis not present

## 2020-03-27 MED ORDER — ZOLPIDEM TARTRATE 10 MG PO TABS: 10.0000 mg | ORAL_TABLET | Freq: Every evening | ORAL | 1 refills | Status: DC | PRN

## 2020-03-27 NOTE — Telephone Encounter (Signed)
See previous message from Madelaine Bhat last week. Pt called back about the Ambien

## 2020-03-27 NOTE — Telephone Encounter (Signed)
Ambien script sent 

## 2020-03-28 NOTE — Telephone Encounter (Signed)
Pt advised.

## 2020-04-05 ENCOUNTER — Other Ambulatory Visit (HOSPITAL_COMMUNITY): Payer: Self-pay | Admitting: Pain Medicine

## 2020-04-05 DIAGNOSIS — M5416 Radiculopathy, lumbar region: Secondary | ICD-10-CM

## 2020-04-08 ENCOUNTER — Encounter (HOSPITAL_COMMUNITY): Payer: Self-pay

## 2020-04-08 NOTE — Progress Notes (Signed)
Pt has the [place]aria MRI Defib Model [id number] with leads [id number] and [id number], which are MR conditional. However, the pt also has the sutureless, unipolar, screw-in pacing lead model 5071 that is MR UNSAFE. MR studies cannot be performed on this pt.

## 2020-04-10 ENCOUNTER — Other Ambulatory Visit: Payer: Self-pay | Admitting: Pain Medicine

## 2020-04-10 ENCOUNTER — Telehealth: Payer: Self-pay

## 2020-04-10 DIAGNOSIS — M5416 Radiculopathy, lumbar region: Secondary | ICD-10-CM

## 2020-04-10 NOTE — Telephone Encounter (Signed)
Phone call to patient to verify medication list and allergies for myelogram procedure. Medications pt is currently taking are safe to continue to take. Advised pt if any new medications are started prior to procedure to call and make us aware. Pt also instructed to have a driver the day of the procedure, he would be with us around 2 hours, and discharge instructions reviewed. Pt verbalized understanding. 

## 2020-04-11 ENCOUNTER — Ambulatory Visit
Admission: RE | Admit: 2020-04-11 | Discharge: 2020-04-11 | Disposition: A | Discharge status: Home / Self Care | Payer: BC Managed Care – PPO | Source: Ambulatory Visit | Attending: Pain Medicine | Admitting: Pain Medicine

## 2020-04-11 ENCOUNTER — Other Ambulatory Visit: Payer: Self-pay

## 2020-04-11 DIAGNOSIS — I700 Atherosclerosis of aorta: Secondary | ICD-10-CM | POA: Diagnosis not present

## 2020-04-11 DIAGNOSIS — M5416 Radiculopathy, lumbar region: Secondary | ICD-10-CM

## 2020-04-11 DIAGNOSIS — M4854XA Collapsed vertebra, not elsewhere classified, thoracic region, initial encounter for fracture: Secondary | ICD-10-CM | POA: Diagnosis not present

## 2020-04-11 MED ORDER — DIAZEPAM 5 MG PO TABS
10.0000 mg | ORAL_TABLET | Freq: Once | ORAL | Status: AC
  Administered 2020-04-11: 10 mg via ORAL

## 2020-04-11 MED ORDER — IOPAMIDOL (ISOVUE-M 200) INJECTION 41%
20.0000 mL | Freq: Once | INTRAMUSCULAR | Status: AC
  Administered 2020-04-11: 20 mL via INTRATHECAL

## 2020-04-11 NOTE — Discharge Instructions (Signed)

## 2020-04-18 ENCOUNTER — Encounter (HOSPITAL_COMMUNITY): Payer: Self-pay

## 2020-04-18 ENCOUNTER — Ambulatory Visit (INDEPENDENT_AMBULATORY_CARE_PROVIDER_SITE_OTHER): Payer: PPO

## 2020-04-18 ENCOUNTER — Ambulatory Visit (HOSPITAL_COMMUNITY): Admission: RE | Admit: 2020-04-18 | Payer: BC Managed Care – PPO | Source: Ambulatory Visit

## 2020-04-18 DIAGNOSIS — I5022 Chronic systolic (congestive) heart failure: Secondary | ICD-10-CM

## 2020-04-18 DIAGNOSIS — I255 Ischemic cardiomyopathy: Secondary | ICD-10-CM

## 2020-04-18 LAB — CUP PACEART REMOTE DEVICE CHECK
Battery Remaining Longevity: 27 mo
Battery Voltage: 2.94 V
Brady Statistic AP VP Percent: 0.13 %
Brady Statistic AP VS Percent: 0.04 %
Brady Statistic AS VP Percent: 99.4 %
Brady Statistic AS VS Percent: 0.43 %
Brady Statistic RA Percent Paced: 0.17 %
Brady Statistic RV Percent Paced: 98.65 %
Date Time Interrogation Session: 20220414033623
HighPow Impedance: 80 Ohm
Implantable Lead Implant Date: 20190610
Implantable Lead Implant Date: 20200306
Implantable Lead Implant Date: 20200306
Implantable Lead Location: 753858
Implantable Lead Location: 753859
Implantable Lead Location: 753860
Implantable Lead Model: 5071
Implantable Lead Model: 5076
Implantable Pulse Generator Implant Date: 20200306
Lead Channel Impedance Value: 285 Ohm
Lead Channel Impedance Value: 380 Ohm
Lead Channel Impedance Value: 4047 Ohm
Lead Channel Impedance Value: 4047 Ohm
Lead Channel Impedance Value: 437 Ohm
Lead Channel Impedance Value: 456 Ohm
Lead Channel Pacing Threshold Amplitude: 0.625 V
Lead Channel Pacing Threshold Amplitude: 0.75 V
Lead Channel Pacing Threshold Amplitude: 2 V
Lead Channel Pacing Threshold Pulse Width: 0.4 ms
Lead Channel Pacing Threshold Pulse Width: 0.4 ms
Lead Channel Pacing Threshold Pulse Width: 1 ms
Lead Channel Sensing Intrinsic Amplitude: 3.125 mV
Lead Channel Sensing Intrinsic Amplitude: 3.125 mV
Lead Channel Sensing Intrinsic Amplitude: 31.625 mV
Lead Channel Sensing Intrinsic Amplitude: 31.625 mV
Lead Channel Setting Pacing Amplitude: 1.5 V
Lead Channel Setting Pacing Amplitude: 2.5 V
Lead Channel Setting Pacing Amplitude: 3 V
Lead Channel Setting Pacing Pulse Width: 0.4 ms
Lead Channel Setting Pacing Pulse Width: 1 ms
Lead Channel Setting Sensing Sensitivity: 0.6 mV

## 2020-04-22 DIAGNOSIS — M5416 Radiculopathy, lumbar region: Secondary | ICD-10-CM | POA: Diagnosis not present

## 2020-04-22 DIAGNOSIS — Z9889 Other specified postprocedural states: Secondary | ICD-10-CM | POA: Diagnosis not present

## 2020-04-22 DIAGNOSIS — M5136 Other intervertebral disc degeneration, lumbar region: Secondary | ICD-10-CM | POA: Diagnosis not present

## 2020-04-22 DIAGNOSIS — S22080A Wedge compression fracture of T11-T12 vertebra, initial encounter for closed fracture: Secondary | ICD-10-CM | POA: Diagnosis not present

## 2020-04-25 ENCOUNTER — Telehealth: Payer: Self-pay | Admitting: Cardiology

## 2020-04-25 NOTE — Telephone Encounter (Signed)
Pa submitted for the repatha [employer] (Key: [id number]). Will route back to cheryl pugh for entresto pa

## 2020-04-25 NOTE — Telephone Encounter (Signed)
Pt c/o medication issue:  1. Name of Medication:  REPATHA SURECLICK 140 MG/ML SOAJ ENTRESTO 24-26 MG  2. How are you currently taking this medication (dosage and times per day)? repatha inject every 14 days, entresto half a tablet twice a day  3. Are you having a reaction (difficulty breathing--STAT)? no  4. What is your medication issue? Patient states his insurance won't cover the medications. He states they said they are either not included or they are included but subject to limits.

## 2020-04-26 NOTE — Telephone Encounter (Signed)
**Note De-Identified Fina Heizer Obfuscation** Entresto PA started through covermymeds. Key: [id number]

## 2020-04-26 NOTE — Telephone Encounter (Signed)
Message sent to [facility] for prior auth for [place].

## 2020-04-29 NOTE — Telephone Encounter (Signed)
**Note De-Identified Kalima Saylor Obfuscation** Message received from covermymeds: [employer] [place] Key: [id number] - PA Case ID: [id number] Outcome Approved on April 22 Coverage Starts on: 04/26/2020 12:00:00 AM, Coverage Ends on: 04/26/2021 12:00:00 AM. Drug: Sherryll Burger 24-26MG  tablets Form: [employer] Medicare 4-Part Electronic PA Form (2017 NCPDP)  I have notified [facility] of this approval.

## 2020-04-30 ENCOUNTER — Telehealth: Payer: Self-pay

## 2020-04-30 NOTE — Telephone Encounter (Signed)
Received letter from [facility] approved through 04/26/21.

## 2020-05-03 NOTE — Progress Notes (Signed)
Remote ICD transmission.   

## 2020-05-06 ENCOUNTER — Telehealth: Payer: Self-pay | Admitting: Cardiology

## 2020-05-06 DIAGNOSIS — E7800 Pure hypercholesterolemia, unspecified: Secondary | ICD-10-CM

## 2020-05-06 NOTE — Telephone Encounter (Signed)
Spoke to patient he stated he was told co pay for Repatha would be 0.Stated he went to pick up today and too expensive $ 80.Advised I will send message to our pharmacist.

## 2020-05-06 NOTE — Telephone Encounter (Signed)
Patient called to talk with Dr. Elvis Coil nurse in regards to repatha. Wants to know why medication is so expensive.

## 2020-05-07 NOTE — Telephone Encounter (Signed)
Can you set him up with [facility]?  Thanks,  K

## 2020-05-07 NOTE — Telephone Encounter (Signed)
Was able to email the [employer] and called the pt and instructed him to take it to the pharmacy and they voiced understanding

## 2020-05-21 DIAGNOSIS — M5416 Radiculopathy, lumbar region: Secondary | ICD-10-CM | POA: Diagnosis not present

## 2020-05-23 ENCOUNTER — Other Ambulatory Visit: Payer: Self-pay | Admitting: Cardiology

## 2020-06-11 DIAGNOSIS — M25551 Pain in right hip: Secondary | ICD-10-CM | POA: Diagnosis not present

## 2020-06-11 DIAGNOSIS — Z9889 Other specified postprocedural states: Secondary | ICD-10-CM | POA: Diagnosis not present

## 2020-06-11 DIAGNOSIS — M25561 Pain in right knee: Secondary | ICD-10-CM | POA: Diagnosis not present

## 2020-06-11 DIAGNOSIS — M5416 Radiculopathy, lumbar region: Secondary | ICD-10-CM | POA: Diagnosis not present

## 2020-07-06 ENCOUNTER — Emergency Department (HOSPITAL_COMMUNITY)
Admission: EM | Admit: 2020-07-06 | Discharge: 2020-07-07 | Disposition: A | Discharge status: Home / Self Care | Payer: PPO | Attending: Emergency Medicine | Admitting: Emergency Medicine

## 2020-07-06 ENCOUNTER — Other Ambulatory Visit: Payer: Self-pay

## 2020-07-06 ENCOUNTER — Emergency Department (HOSPITAL_COMMUNITY): Payer: PPO

## 2020-07-06 DIAGNOSIS — R0602 Shortness of breath: Secondary | ICD-10-CM | POA: Insufficient documentation

## 2020-07-06 DIAGNOSIS — U071 COVID-19: Secondary | ICD-10-CM | POA: Insufficient documentation

## 2020-07-06 DIAGNOSIS — R42 Dizziness and giddiness: Secondary | ICD-10-CM | POA: Diagnosis not present

## 2020-07-06 DIAGNOSIS — R0789 Other chest pain: Secondary | ICD-10-CM | POA: Insufficient documentation

## 2020-07-06 DIAGNOSIS — I517 Cardiomegaly: Secondary | ICD-10-CM | POA: Diagnosis not present

## 2020-07-06 DIAGNOSIS — R0981 Nasal congestion: Secondary | ICD-10-CM | POA: Insufficient documentation

## 2020-07-06 DIAGNOSIS — R079 Chest pain, unspecified: Secondary | ICD-10-CM | POA: Diagnosis not present

## 2020-07-06 DIAGNOSIS — R519 Headache, unspecified: Secondary | ICD-10-CM | POA: Diagnosis not present

## 2020-07-06 DIAGNOSIS — Z5321 Procedure and treatment not carried out due to patient leaving prior to being seen by health care provider: Secondary | ICD-10-CM | POA: Diagnosis not present

## 2020-07-06 DIAGNOSIS — R110 Nausea: Secondary | ICD-10-CM | POA: Insufficient documentation

## 2020-07-06 LAB — COMPREHENSIVE METABOLIC PANEL
ALT: 22 U/L (ref 0–44)
AST: 26 U/L (ref 15–41)
Albumin: 3.6 g/dL (ref 3.5–5.0)
Alkaline Phosphatase: 51 U/L (ref 38–126)
Anion gap: 8 (ref 5–15)
BUN: 27 mg/dL — ABNORMAL HIGH (ref 8–23)
CO2: 24 mmol/L (ref 22–32)
Calcium: 8.7 mg/dL — ABNORMAL LOW (ref 8.9–10.3)
Chloride: 103 mmol/L (ref 98–111)
Creatinine, Ser: 1.2 mg/dL (ref 0.61–1.24)
GFR, Estimated: >60 mL/min (ref >60)
Glucose, Bld: 158 mg/dL — ABNORMAL HIGH (ref 70–99)
Potassium: 4.1 mmol/L (ref 3.5–5.1)
Sodium: 135 mmol/L (ref 135–145)
Total Bilirubin: 1 mg/dL (ref 0.3–1.2)
Total Protein: 6.2 g/dL — ABNORMAL LOW (ref 6.5–8.1)

## 2020-07-06 LAB — URINALYSIS, ROUTINE W REFLEX MICROSCOPIC
Bilirubin Urine: NEGATIVE
Glucose, UA: NEGATIVE mg/dL
Hgb urine dipstick: NEGATIVE
Ketones, ur: NEGATIVE mg/dL
Nitrite: NEGATIVE
Protein, ur: NEGATIVE mg/dL
Specific Gravity, Urine: 1.034 — ABNORMAL HIGH (ref 1.005–1.030)
WBC, UA: >50 WBC/hpf — ABNORMAL HIGH (ref 0–5)
pH: 5 (ref 5.0–8.0)

## 2020-07-06 LAB — CBC WITH DIFFERENTIAL/PLATELET
Abs Immature Granulocytes: 0.06 10*3/uL (ref 0.00–0.07)
Basophils Absolute: 0 10*3/uL (ref 0.0–0.1)
Basophils Relative: 0 %
Eosinophils Absolute: 0.1 10*3/uL (ref 0.0–0.5)
Eosinophils Relative: 1 %
HCT: 48.5 % (ref 39.0–52.0)
Hemoglobin: 15.6 g/dL (ref 13.0–17.0)
Immature Granulocytes: 1 %
Lymphocytes Relative: 26 %
Lymphs Abs: 1.4 10*3/uL (ref 0.7–4.0)
MCH: 32.1 pg (ref 26.0–34.0)
MCHC: 32.2 g/dL (ref 30.0–36.0)
MCV: 99.8 fL (ref 80.0–100.0)
Monocytes Absolute: 0.4 10*3/uL (ref 0.1–1.0)
Monocytes Relative: 7 %
Neutro Abs: 3.5 10*3/uL (ref 1.7–7.7)
Neutrophils Relative %: 65 %
Platelets: 207 10*3/uL (ref 150–400)
RBC: 4.86 MIL/uL (ref 4.22–5.81)
RDW: 14.3 % (ref 11.5–15.5)
WBC: 5.4 10*3/uL (ref 4.0–10.5)
nRBC: 0 % (ref 0.0–0.2)

## 2020-07-06 LAB — BRAIN NATRIURETIC PEPTIDE: B Natriuretic Peptide: 177.4 pg/mL — ABNORMAL HIGH (ref 0.0–100.0)

## 2020-07-06 LAB — TROPONIN I (HIGH SENSITIVITY)
Troponin I (High Sensitivity): 20 ng/L — ABNORMAL HIGH (ref <18)
Troponin I (High Sensitivity): 20 ng/L — ABNORMAL HIGH (ref <18)

## 2020-07-06 NOTE — ED Provider Notes (Signed)
Emergency Medicine Provider Triage Evaluation Note  [name] , a 65 y.o. male  was evaluated in triage.  Pt complains of gradually worsening symptoms after testing positive for COVID at home on 06/28. Reports that today his shortness of breath was worse as well as lightheadedness even at rest with chest tightness. Pt states he has been forcing himself to eat as he is not hungry however has attempted to stay as hydrated as possible. No vomiting or diarrhea. He called his PCP today was advised he come to the ED for further evaluation. Pt not on antivirals for his COVID infection. Vaccinated x 4.   Review of Systems  Positive: + chest tightness, SOB, lightheadedness Negative: - passing out, nausea, vomiting, diarrhea  Physical Exam  BP (!) 155/92 (BP Location: Left Arm)   Pulse (!) 58   Temp 97.8 F (36.6 C) (Oral)   Resp (!) 23   Ht 6\' 2"  (1.88 m)   Wt 102.5 kg   SpO2 98%   BMI 29.02 kg/m  Gen:   Awake, no distress   Resp:  Normal effort  MSK:   Moves extremities without difficulty  Other:    Medical Decision Making  Medically screening exam initiated at 7:40 PM.  Appropriate orders placed.  OZELL JUHASZ was informed that the remainder of the evaluation will be completed by another provider, this initial triage assessment does not replace that evaluation, and the importance of remaining in the ED until their evaluation is complete.     [name], PA-C 07/06/20 1941    09/06/20, MD 07/06/20 2026

## 2020-07-06 NOTE — ED Triage Notes (Signed)
Pt diagnosed COVID+ Tuesday 6/28. c/o worsening symptoms including: headache, congestion, dizziness, SOB worsening with exertion, chest tightness and nausea. Eating and drinking per normal. States having extensive cardiac history pt states chest tightness doesn't feel cardiac. Denies VD.

## 2020-07-07 NOTE — ED Notes (Signed)
Pt stated he was leaving AMA 

## 2020-07-18 ENCOUNTER — Ambulatory Visit (INDEPENDENT_AMBULATORY_CARE_PROVIDER_SITE_OTHER): Payer: PPO

## 2020-07-18 DIAGNOSIS — I5022 Chronic systolic (congestive) heart failure: Secondary | ICD-10-CM

## 2020-07-18 DIAGNOSIS — I255 Ischemic cardiomyopathy: Secondary | ICD-10-CM | POA: Diagnosis not present

## 2020-07-18 LAB — CUP PACEART REMOTE DEVICE CHECK
Battery Remaining Longevity: 24 mo
Battery Voltage: 2.94 V
Brady Statistic AP VP Percent: 0.24 %
Brady Statistic AP VS Percent: 0.04 %
Brady Statistic AS VP Percent: 98.17 %
Brady Statistic AS VS Percent: 1.54 %
Brady Statistic RA Percent Paced: 0.28 %
Brady Statistic RV Percent Paced: 95.75 %
Date Time Interrogation Session: 20220714022505
HighPow Impedance: 77 Ohm
Implantable Lead Implant Date: 20190610
Implantable Lead Implant Date: 20200306
Implantable Lead Implant Date: 20200306
Implantable Lead Location: 753858
Implantable Lead Location: 753859
Implantable Lead Location: 753860
Implantable Lead Model: 5071
Implantable Lead Model: 5076
Implantable Pulse Generator Implant Date: 20200306
Lead Channel Impedance Value: 342 Ohm
Lead Channel Impedance Value: 399 Ohm
Lead Channel Impedance Value: 4047 Ohm
Lead Channel Impedance Value: 4047 Ohm
Lead Channel Impedance Value: 437 Ohm
Lead Channel Impedance Value: 494 Ohm
Lead Channel Pacing Threshold Amplitude: 0.625 V
Lead Channel Pacing Threshold Amplitude: 0.75 V
Lead Channel Pacing Threshold Amplitude: 1.875 V
Lead Channel Pacing Threshold Pulse Width: 0.4 ms
Lead Channel Pacing Threshold Pulse Width: 0.4 ms
Lead Channel Pacing Threshold Pulse Width: 1 ms
Lead Channel Sensing Intrinsic Amplitude: 3.875 mV
Lead Channel Sensing Intrinsic Amplitude: 3.875 mV
Lead Channel Sensing Intrinsic Amplitude: 31.625 mV
Lead Channel Sensing Intrinsic Amplitude: 31.625 mV
Lead Channel Setting Pacing Amplitude: 1.5 V
Lead Channel Setting Pacing Amplitude: 2.5 V
Lead Channel Setting Pacing Amplitude: 3 V
Lead Channel Setting Pacing Pulse Width: 0.4 ms
Lead Channel Setting Pacing Pulse Width: 1 ms
Lead Channel Setting Sensing Sensitivity: 0.6 mV

## 2020-07-23 DIAGNOSIS — M25551 Pain in right hip: Secondary | ICD-10-CM | POA: Diagnosis not present

## 2020-07-31 ENCOUNTER — Emergency Department (HOSPITAL_BASED_OUTPATIENT_CLINIC_OR_DEPARTMENT_OTHER): Payer: PPO

## 2020-07-31 ENCOUNTER — Emergency Department (HOSPITAL_BASED_OUTPATIENT_CLINIC_OR_DEPARTMENT_OTHER)
Admission: EM | Admit: 2020-07-31 | Discharge: 2020-07-31 | Disposition: A | Discharge status: Home / Self Care | Payer: PPO | Attending: Emergency Medicine | Admitting: Emergency Medicine

## 2020-07-31 ENCOUNTER — Emergency Department (HOSPITAL_BASED_OUTPATIENT_CLINIC_OR_DEPARTMENT_OTHER): Payer: PPO | Admitting: Radiology

## 2020-07-31 ENCOUNTER — Encounter (HOSPITAL_BASED_OUTPATIENT_CLINIC_OR_DEPARTMENT_OTHER): Payer: Self-pay | Admitting: Obstetrics and Gynecology

## 2020-07-31 ENCOUNTER — Other Ambulatory Visit: Payer: Self-pay

## 2020-07-31 DIAGNOSIS — I5022 Chronic systolic (congestive) heart failure: Secondary | ICD-10-CM | POA: Insufficient documentation

## 2020-07-31 DIAGNOSIS — Z7982 Long term (current) use of aspirin: Secondary | ICD-10-CM | POA: Diagnosis not present

## 2020-07-31 DIAGNOSIS — Z79899 Other long term (current) drug therapy: Secondary | ICD-10-CM | POA: Insufficient documentation

## 2020-07-31 DIAGNOSIS — Z951 Presence of aortocoronary bypass graft: Secondary | ICD-10-CM | POA: Insufficient documentation

## 2020-07-31 DIAGNOSIS — R0602 Shortness of breath: Secondary | ICD-10-CM | POA: Diagnosis not present

## 2020-07-31 DIAGNOSIS — R0789 Other chest pain: Secondary | ICD-10-CM | POA: Diagnosis not present

## 2020-07-31 DIAGNOSIS — R079 Chest pain, unspecified: Secondary | ICD-10-CM | POA: Diagnosis not present

## 2020-07-31 DIAGNOSIS — R1013 Epigastric pain: Secondary | ICD-10-CM | POA: Diagnosis not present

## 2020-07-31 DIAGNOSIS — K5730 Diverticulosis of large intestine without perforation or abscess without bleeding: Secondary | ICD-10-CM | POA: Diagnosis not present

## 2020-07-31 DIAGNOSIS — Z955 Presence of coronary angioplasty implant and graft: Secondary | ICD-10-CM | POA: Insufficient documentation

## 2020-07-31 DIAGNOSIS — J90 Pleural effusion, not elsewhere classified: Secondary | ICD-10-CM | POA: Diagnosis not present

## 2020-07-31 DIAGNOSIS — I2510 Atherosclerotic heart disease of native coronary artery without angina pectoris: Secondary | ICD-10-CM | POA: Insufficient documentation

## 2020-07-31 DIAGNOSIS — I110 Hypertensive heart disease with heart failure: Secondary | ICD-10-CM | POA: Insufficient documentation

## 2020-07-31 DIAGNOSIS — Z87891 Personal history of nicotine dependence: Secondary | ICD-10-CM | POA: Insufficient documentation

## 2020-07-31 DIAGNOSIS — J9811 Atelectasis: Secondary | ICD-10-CM | POA: Diagnosis not present

## 2020-07-31 DIAGNOSIS — I700 Atherosclerosis of aorta: Secondary | ICD-10-CM | POA: Diagnosis not present

## 2020-07-31 DIAGNOSIS — R7989 Other specified abnormal findings of blood chemistry: Secondary | ICD-10-CM | POA: Diagnosis not present

## 2020-07-31 LAB — HEPATIC FUNCTION PANEL
ALT: 22 U/L (ref 0–44)
AST: 17 U/L (ref 15–41)
Albumin: 4.5 g/dL (ref 3.5–5.0)
Alkaline Phosphatase: 51 U/L (ref 38–126)
Bilirubin, Direct: 0.2 mg/dL (ref 0.0–0.2)
Indirect Bilirubin: 0.8 mg/dL (ref 0.3–0.9)
Total Bilirubin: 1 mg/dL (ref 0.3–1.2)
Total Protein: 7.3 g/dL (ref 6.5–8.1)

## 2020-07-31 LAB — BASIC METABOLIC PANEL
Anion gap: 8 (ref 5–15)
BUN: 25 mg/dL — ABNORMAL HIGH (ref 8–23)
CO2: 29 mmol/L (ref 22–32)
Calcium: 9.7 mg/dL (ref 8.9–10.3)
Chloride: 101 mmol/L (ref 98–111)
Creatinine, Ser: 0.95 mg/dL (ref 0.61–1.24)
GFR, Estimated: >60 mL/min (ref >60)
Glucose, Bld: 102 mg/dL — ABNORMAL HIGH (ref 70–99)
Potassium: 4.6 mmol/L (ref 3.5–5.1)
Sodium: 138 mmol/L (ref 135–145)

## 2020-07-31 LAB — CBC
HCT: 47.3 % (ref 39.0–52.0)
Hemoglobin: 15.6 g/dL (ref 13.0–17.0)
MCH: 32.4 pg (ref 26.0–34.0)
MCHC: 33 g/dL (ref 30.0–36.0)
MCV: 98.3 fL (ref 80.0–100.0)
Platelets: 205 10*3/uL (ref 150–400)
RBC: 4.81 MIL/uL (ref 4.22–5.81)
RDW: 14.5 % (ref 11.5–15.5)
WBC: 8.7 10*3/uL (ref 4.0–10.5)
nRBC: 0 % (ref 0.0–0.2)

## 2020-07-31 LAB — TROPONIN I (HIGH SENSITIVITY)
Troponin I (High Sensitivity): 19 ng/L — ABNORMAL HIGH (ref <18)
Troponin I (High Sensitivity): 15 ng/L (ref <18)

## 2020-07-31 LAB — URINALYSIS, ROUTINE W REFLEX MICROSCOPIC
Bilirubin Urine: NEGATIVE
Glucose, UA: NEGATIVE mg/dL
Hgb urine dipstick: NEGATIVE
Ketones, ur: NEGATIVE mg/dL
Leukocytes,Ua: NEGATIVE
Nitrite: NEGATIVE
Protein, ur: NEGATIVE mg/dL
Specific Gravity, Urine: <1.005 — ABNORMAL LOW (ref 1.005–1.030)
pH: 6 (ref 5.0–8.0)

## 2020-07-31 LAB — D-DIMER, QUANTITATIVE: D-Dimer, Quant: 1.13 ug{FEU}/mL — ABNORMAL HIGH (ref 0.00–0.50)

## 2020-07-31 LAB — BRAIN NATRIURETIC PEPTIDE: B Natriuretic Peptide: 224 pg/mL — ABNORMAL HIGH (ref 0.0–100.0)

## 2020-07-31 LAB — LIPASE, BLOOD: Lipase: 89 U/L — ABNORMAL HIGH (ref 11–51)

## 2020-07-31 MED ORDER — IOHEXOL 350 MG/ML SOLN
100.0000 mL | Freq: Once | INTRAVENOUS | Status: AC | PRN
  Administered 2020-07-31: 100 mL via INTRAVENOUS

## 2020-07-31 NOTE — Discharge Instructions (Addendum)
Your labs today all looked relatively the same as in the past.  The heart markers were reassuring and were 19 and then 15.  EKG showed that your pacemaker was still capturing.  No evidence of low oxygen levels here.  You may want to try elevating your bed at a slant if the episode occurs at night.  Continue to take all of your normal medications.  If you have recurrent episodes and cannot catch her breath, passing out, vomiting or other concerns please return for further evaluation.  In addition to seeing your cardiologist you may need to see a GI doctor in the future as well.

## 2020-07-31 NOTE — ED Provider Notes (Signed)
MEDCENTER [facility] EMERGENCY DEPT Provider Note   CSN: [id number] Arrival date & time: 07/31/20  1444     History Chief Complaint  Patient presents with   Chest Pain    [name] is a 65 y.o. male.  Patient is a 65 year old male with a history of NSTEMI status post four-vessel CABG in 2019 with CHF initially EF of 25 to 30% which improved after pacemaker placement 2 years ago between 40 and 45% with ongoing stomach issues that are unexplainable at baseline who is presenting today with several complaints.  Patient was diagnosed with COVID 3 weeks ago and reports had pretty much gotten over that but in the last 2 days he has had multiple episodes of having a discomfort in his epigastric and chest area that sometimes will radiate to his teeth cough shortness of breath and feeling lightheaded that will last between 20 and 30 minutes.  The episodes are not exacerbated by anything.  They can occur at rest or when he is lying down.  He can also have them when he is up moving around.  He has had multiple stools over the past few days but they have not been diarrhea.  He will have some abdominal bloating but no significant pain.  He has been urinating quite frequently but denies any dysuria.  Patient used to be on Entresto and spironolactone however due to intolerance and low blood pressures he has discontinued those medications.  Patient denies any symptoms at this moment and they only occur when he is having an episode.  He did have his pacemaker interrogated last night because of the episodes but nobody called him today and he saw the report that his device was working fine.  He has not had new fever, pain in the arms or numbness.  Prior to COVID he would have these episodes occasionally and then long periods of time without them but in the last few days they have been much more frequent.  He denies any weight gain or distal edema in his legs.  The history is provided by the patient, medical  records and the spouse.      Past Medical History:  Diagnosis Date   Anxiety    Arthritis    "mild; back, neck, spine" (06/09/2017)   CHF (congestive heart failure) (HCC)    Chronic back pain    Coronary artery disease    History of gout    History of kidney stones X 2   Hypercholesterolemia    Hypertension    LBBB (left bundle branch block)    MI (myocardial infarction) (HCC) 1993   LATERAL   MI (myocardial infarction) (HCC) ?09/2001; 06/07/2017   Pneumonia    "several times" (06/09/2017)   S/P coronary artery stent placement    RCA   Varicose veins     Patient Active Problem List   Diagnosis Date Noted   Insomnia 01/11/2020   Back pain 01/11/2020   History of urinary tract infection 01/11/2020   Vaccine counseling 01/11/2020   Rumination 01/11/2020   Anxiety 01/11/2020   Chronic diarrhea 12/05/2019   Food intolerance 12/05/2019   Abdominal cramping 12/05/2019   Abdominal bloating 12/05/2019   Need for influenza vaccination 12/05/2019   Right knee pain 11/03/2019   Shoulder pain, acute 06/09/2019   Hip pain 02/24/2019   Knee pain 02/24/2019   Bulbous urethral stricture 10/15/2017   Personal history of urethral stricture 08/10/2017   Chronic systolic heart failure (HCC) 06/22/2017   S/P  CABG x 4 06/14/2017   ACS (acute coronary syndrome) (HCC) 06/08/2017   Pain in the chest    Gout 08/30/2014   Neck pain, chronic 12/17/2010   Back pain, chronic 12/01/2010   Pericarditis 10/02/2010   CAD (coronary artery disease) 05/14/2010   Hypertension    Hypercholesterolemia    LBBB (left bundle branch block)    NSTEMI (non-ST elevated myocardial infarction) [facility])     Past Surgical History:  Procedure Laterality Date   ANTERIOR CERVICAL DECOMP/DISCECTOMY FUSION  03/03/2011   Procedure: ANTERIOR CERVICAL DECOMPRESSION/DISCECTOMY FUSION 3 LEVELS;  Surgeon: Karn Cassis, MD;  Location: [facility] NEURO ORS;  Service: Neurosurgery;  Laterality: N/A;  Cervical three-four Cervical  four-five Cervical five-six Anterior cervical decompression/diskectomy, fusion   BACK SURGERY     CARDIAC CATHETERIZATION  2009   Stents in RCA patent. 70 to 80% PL, and 50% ostial PD.    CARDIAC CATHETERIZATION N/A 11/20/2014   Procedure: Left Heart Cath and Coronary Angiography;  Surgeon: Peter M Swaziland, MD;  Location: [facility] INVASIVE CV LAB;  Service: Cardiovascular;  Laterality: N/A;   CORONARY ANGIOPLASTY     DIRECT ANGIOPLASTY THE MARGINAL BRANCH   CORONARY ANGIOPLASTY WITH STENT PLACEMENT     RCA   CORONARY ARTERY BYPASS GRAFT N/A 06/14/2017   Procedure: CORONARY ARTERY BYPASS GRAFTING (CABG) x four, using left internal mammary artery and right leg greater saphenous vein harvested endoscopically;  Surgeon: Kerin Perna, MD;  Location: [facility] OR;  Service: Open Heart Surgery;  Laterality: N/A;   CYSTOSCOPY N/A 06/14/2017   Procedure: CYSTOSCOPY;  Surgeon: Kerin Perna, MD;  Location: [facility] OR;  Service: Open Heart Surgery;  Laterality: N/A;   ICD IMPLANT N/A 03/11/2018   Procedure: ICD IMPLANT - Dual Chamber;  Surgeon: [name], MD;  Location: [facility] INVASIVE CV LAB;  Service: Cardiovascular;  Laterality: N/A;   INSERTION OF SUPRAPUBIC CATHETER N/A 06/14/2017   Procedure: INSERTION OF SUPRAPUBIC CATHETER - Lower abdomen;  Surgeon: Kerin Perna, MD;  Location: [facility] OR;  Service: Open Heart Surgery;  Laterality: N/A;   POSTERIOR LUMBAR FUSION  10/2011   RIGHT/LEFT HEART CATH AND CORONARY ANGIOGRAPHY N/A 06/08/2017   Procedure: RIGHT/LEFT HEART CATH AND CORONARY ANGIOGRAPHY;  Surgeon: Swaziland, Peter M, MD;  Location: [facility] INVASIVE CV LAB;  Service: Cardiovascular;  Laterality: N/A;   TEE WITHOUT CARDIOVERSION N/A 06/14/2017   Procedure: TRANSESOPHAGEAL ECHOCARDIOGRAM (TEE);  Surgeon: Donata Clay, Theron Arista, MD;  Location: [facility] OR;  Service: Open Heart Surgery;  Laterality: N/A;   URETHROPLASTY N/A 10/15/2017   Procedure: URETHROPLASTY WITH BUCCAL GRAFT HARVAST;  Surgeon: Crist Fat, MD;  Location:  [facility] ORS;  Service: Urology;  Laterality: N/A;       Family History  Problem Relation Age of Onset   Heart attack Father    Diabetes Father    Breast cancer Mother    Coronary artery disease Mother    Heart disease Brother    Heart attack Maternal Grandfather     Social History   Tobacco Use   Smoking status: Former    Packs/day: 2.00    Years: 20.00    Pack years: 40.00    Types: Cigarettes    Quit date: 05/07/1999    Years since quitting: 21.2   Smokeless tobacco: Never  Vaping Use   Vaping Use: Never used  Substance Use Topics   Alcohol use: Yes    Alcohol/week: 1.0 standard drink    Types: 1 Cans of beer per week  Drug use: Never    Home Medications Prior to Admission medications   Medication Sig Start Date End Date Taking? Authorizing Provider  acetaminophen (TYLENOL) 500 MG tablet Take 1,000 mg by mouth 2 (two) times daily.    [provider]  aspirin 81 MG EC tablet Take 81 mg by mouth daily.     [provider]  carvedilol (COREG) 6.25 MG tablet Take 0.5 tablets (3.125 mg total) by mouth 2 (two) times daily with a meal. Must have office visit for additional refills [phone] 02/05/20   [place]il[place], Peter M, MD  Eluxadoline (VIBERZI) 75 MG TABS Take 1 tablet by mouth daily. 12/07/19   Tysinger, Kermit Balo, PA-C  ENTRESTO 24-26 MG TAKE 1/2 TABLET BY MOUTH 2 TIMES DAILY 08/18/19   [place], Peter M, MD  esomeprazole (NEXIUM) 20 MG capsule TAKE 1 CAPSULE BY MOUTH EVERY DAY 03/27/20   [place], Peter M, MD  fenofibrate (TRICOR) 145 MG tablet TAKE 1 TABLET BY MOUTH EVERY DAY 01/02/19   [place], Peter M, MD  Multiple Vitamins-Minerals (MENS MULTIVITAMIN PLUS PO) Take 1 tablet by mouth 2 (two) times daily.    [provider]  OVER THE COUNTER MEDICATION Take 2 capsules by mouth at bedtime. Deep Sleep    [provider]  Probiotic Product (PROBIOTIC PO) Take 1 capsule by mouth 2 (two) times daily.    [provider]  REPATHA SURECLICK 140  MG/ML SOAJ INJECT INTO SKIN EVERY 14 DAYS 05/23/20   [place], Peter M, MD  rifaximin (XIFAXAN) 550 MG TABS tablet Take 1 tablet (550 mg total) by mouth 3 (three) times daily. 01/11/20   Tysinger, Kermit Balo, PA-C  sulfamethoxazole-trimethoprim (BACTRIM DS) 800-160 MG tablet Take 1 tablet by mouth 2 (two) times daily. Patient not taking: Reported on 04/10/2020 01/15/20   Tysinger, Kermit Balo, PA-C  zolpidem (AMBIEN) 10 MG tablet Take 1 tablet (10 mg total) by mouth at bedtime as needed. for sleep 03/27/20   [name], PA-C    Allergies    Ezetimibe, Niacin, and Statins  Review of Systems   Review of Systems  All other systems reviewed and are negative.  Physical Exam Updated Vital Signs BP 133/70   Pulse 76   Temp 98.3 F (36.8 C)   Resp 15   SpO2 96%   Physical Exam Vitals and nursing note reviewed.  Constitutional:      General: He is not in acute distress.    Appearance: He is well-developed.  HENT:     Head: Normocephalic and atraumatic.     Mouth/Throat:     Mouth: Mucous membranes are moist.  Eyes:     Conjunctiva/sclera: Conjunctivae normal.     Pupils: Pupils are equal, round, and reactive to light.  Cardiovascular:     Rate and Rhythm: Normal rate and regular rhythm.     Pulses: Normal pulses.     Heart sounds: No murmur heard.    Comments: Pacemaker/defibrillator present in the left upper chest with a well-healed scar Pulmonary:     Effort: Pulmonary effort is normal. No respiratory distress.     Breath sounds: Normal breath sounds. No wheezing or rales.     Comments: Chest wall pain just left of the sternum Chest:     Chest wall: Tenderness present.  Abdominal:     General: There is no distension.     Palpations: Abdomen is soft.     Tenderness: There is abdominal tenderness in the epigastric area. There is no  right CVA tenderness, left CVA tenderness, guarding or rebound.  Musculoskeletal:        General: No tenderness. Normal range of motion.     Cervical  back: Normal range of motion and neck supple.     Right lower leg: No edema.     Left lower leg: No edema.  Skin:    General: Skin is warm and dry.     Capillary Refill: Capillary refill takes less than 2 seconds.     Findings: No erythema or rash.  Neurological:     General: No focal deficit present.     Mental Status: He is alert and oriented to person, place, and time. Mental status is at baseline.  Psychiatric:        Mood and Affect: Mood normal.        Behavior: Behavior normal.    ED Results / Procedures / Treatments   Labs (all labs ordered are listed, but only abnormal results are displayed) Labs Reviewed  BASIC METABOLIC PANEL - Abnormal; Notable for the following components:      Result Value   Glucose, Bld 102 (*)    BUN 25 (*)    All other components within normal limits  D-DIMER, QUANTITATIVE - Abnormal; Notable for the following components:   D-Dimer, Quant 1.13 (*)    All other components within normal limits  BRAIN NATRIURETIC PEPTIDE - Abnormal; Notable for the following components:   B Natriuretic Peptide 224.0 (*)    All other components within normal limits  URINALYSIS, ROUTINE W REFLEX MICROSCOPIC - Abnormal; Notable for the following components:   Specific Gravity, Urine <1.005 (*)    All other components within normal limits  LIPASE, BLOOD - Abnormal; Notable for the following components:   Lipase 89 (*)    All other components within normal limits  TROPONIN I (HIGH SENSITIVITY) - Abnormal; Notable for the following components:   Troponin I (High Sensitivity) 19 (*)    All other components within normal limits  CBC  HEPATIC FUNCTION PANEL  TROPONIN I (HIGH SENSITIVITY)    EKG EKG Interpretation  Date/Time:  Wednesday July 31 2020 14:52:03 EDT Ventricular Rate:  75 PR Interval:  146 QRS Duration: 154 QT Interval:  458 QTC Calculation: 511 R Axis:   61 Text Interpretation: Atrial-sensed ventricular-paced rhythm with occasional Premature  ventricular complexes No significant change since last tracing Confirmed by Gwyneth SproutPlunkett, Krisha Beegle ([id number]) on 07/31/2020 3:34:01 PM  Radiology DG Chest 2 View  Result Date: 07/31/2020 CLINICAL DATA:  Chest pain. EXAM: CHEST - 2 VIEW COMPARISON:  PA and lateral chest 07/06/2020 and 03/11/2018. CT chest 03/11/2018. FINDINGS: The patient is status post CABG with an AICD in place. Small left pleural effusion and basilar atelectasis are unchanged. The right lung is clear. No pneumothorax. Heart size is normal. IMPRESSION: No change in a small left pleural effusion and mild basilar atelectasis since 2020. Electronically Signed   By: Drusilla Kannerhomas  Dalessio M.D.   On: 07/31/2020 15:31   CT Angio Chest PE W and/or Wo Contrast  Result Date: 07/31/2020 CLINICAL DATA:  65 year old male with epigastric pain. Positive D-dimer. EXAM: CT ANGIOGRAPHY CHEST CT ABDOMEN AND PELVIS WITH CONTRAST TECHNIQUE: Multidetector CT imaging of the chest was performed using the standard protocol during bolus administration of intravenous contrast. Multiplanar CT image reconstructions and MIPs were obtained to evaluate the vascular anatomy. Multidetector CT imaging of the abdomen and pelvis was performed using the standard protocol during bolus administration of intravenous contrast. CONTRAST:  OMNIPAQUE IOHEXOL 350 MG/ML SOLN COMPARISON:  Chest radiograph dated 07/31/2020 and CT dated 03/11/2018. FINDINGS: CTA CHEST FINDINGS Cardiovascular: Mild cardiomegaly. There is 3 vessel coronary vascular calcification and postsurgical changes of CABG. No pericardial effusion. Mild atherosclerotic calcification of the thoracic aorta. No aneurysmal dilatation. No pulmonary artery embolus identified. Mediastinum/Nodes: There is no hilar or mediastinal adenopathy. The esophagus is grossly unremarkable. No mediastinal fluid collection. Lungs/Pleura: Small somewhat loculated appearing left pleural effusion with apparent split pleural sign. Clinical correlation  is recommended to evaluate for possibility of an infected collection or empyema. This has decreased in size since the prior CT. Interval decrease in the size of the rounded density at the left lung base, likely representing rounded atelectasis. Attention on follow-up imaging recommended. There is centrilobular emphysema. No new consolidation. No pneumothorax. The central airways are patent. Musculoskeletal: Slight interval progression of compression fracture of T12 seen on the lumbar spine of 04/11/2020. There is greater than 50% loss of vertebral body height centrally. There is disc desiccation and vacuum phenomena at T11-T12 and T12-L1. No new fracture. Median sternotomy wires. Left pectoral pacemaker device. Osteopenia with degenerative changes of the spine. Old healed right clavicular fracture. Review of the MIP images confirms the above findings. CT ABDOMEN and PELVIS FINDINGS No intra-abdominal free air or free fluid. Hepatobiliary: No focal liver abnormality is seen. No gallstones, gallbladder wall thickening, or biliary dilatation. Pancreas: Unremarkable. No pancreatic ductal dilatation or surrounding inflammatory changes. Spleen: Normal in size without focal abnormality. Adrenals/Urinary Tract: The adrenal glands unremarkable. There is no hydronephrosis on either side. There is symmetric enhancement and excretion of contrast by both kidneys. Mild bilateral perinephric stranding, nonspecific. The visualized ureters and the urinary bladder appear unremarkable. Stomach/Bowel: There is sigmoid diverticulosis without active inflammatory changes. There is moderate stool throughout the colon. Several top-normal caliber distal small bowel loops with fecalized content may represent increased transit time or small intestinal bacterial overgrowth. Clinical correlation is recommended. There is no bowel obstruction or active inflammation. The appendix is normal. Vascular/Lymphatic: Advanced aortoiliac atherosclerotic  disease. The IVC is unremarkable. No portal venous gas. There is no adenopathy. Reproductive: The prostate and seminal vesicles are grossly unremarkable. No pelvic mass. Other: None Musculoskeletal: Osteopenia with degenerative changes of the spine. Old compression fracture of superior endplate of L3 as seen on the prior lumbar CT of 04/11/2020. L5-S1 posterior fusion and grade 1 anterolisthesis. There is disc desiccation and vacuum phenomena at L4-L5 and L5-S1. No acute osseous pathology. Review of the MIP images confirms the above findings. IMPRESSION: 1. No CT evidence of pulmonary embolism or aortic dissection. 2. Small somewhat loculated appearing left pleural effusion and left lung base probable rounded atelectasis, both decreased in size since the prior CT. 3. Minimal interval progression of T12 compression fracture since the lumbar spine CT of 04/11/2020. 4. Sigmoid diverticulosis. No bowel obstruction. Normal appendix. 5. Aortic Atherosclerosis (ICD10-I70.0) and Emphysema (ICD10-J43.9). Electronically Signed   By: Elgie Collard M.D.   On: 07/31/2020 19:15   CT ABDOMEN PELVIS W CONTRAST  Result Date: 07/31/2020 CLINICAL DATA:  65 year old male with epigastric pain. Positive D-dimer. EXAM: CT ANGIOGRAPHY CHEST CT ABDOMEN AND PELVIS WITH CONTRAST TECHNIQUE: Multidetector CT imaging of the chest was performed using the standard protocol during bolus administration of intravenous contrast. Multiplanar CT image reconstructions and MIPs were obtained to evaluate the vascular anatomy. Multidetector CT imaging of the abdomen and pelvis was performed using the standard protocol during bolus administration of intravenous contrast. CONTRAST:  OMNIPAQUE  IOHEXOL 350 MG/ML SOLN COMPARISON:  Chest radiograph dated 07/31/2020 and CT dated 03/11/2018. FINDINGS: CTA CHEST FINDINGS Cardiovascular: Mild cardiomegaly. There is 3 vessel coronary vascular calcification and postsurgical changes of CABG. No pericardial  effusion. Mild atherosclerotic calcification of the thoracic aorta. No aneurysmal dilatation. No pulmonary artery embolus identified. Mediastinum/Nodes: There is no hilar or mediastinal adenopathy. The esophagus is grossly unremarkable. No mediastinal fluid collection. Lungs/Pleura: Small somewhat loculated appearing left pleural effusion with apparent split pleural sign. Clinical correlation is recommended to evaluate for possibility of an infected collection or empyema. This has decreased in size since the prior CT. Interval decrease in the size of the rounded density at the left lung base, likely representing rounded atelectasis. Attention on follow-up imaging recommended. There is centrilobular emphysema. No new consolidation. No pneumothorax. The central airways are patent. Musculoskeletal: Slight interval progression of compression fracture of T12 seen on the lumbar spine of 04/11/2020. There is greater than 50% loss of vertebral body height centrally. There is disc desiccation and vacuum phenomena at T11-T12 and T12-L1. No new fracture. Median sternotomy wires. Left pectoral pacemaker device. Osteopenia with degenerative changes of the spine. Old healed right clavicular fracture. Review of the MIP images confirms the above findings. CT ABDOMEN and PELVIS FINDINGS No intra-abdominal free air or free fluid. Hepatobiliary: No focal liver abnormality is seen. No gallstones, gallbladder wall thickening, or biliary dilatation. Pancreas: Unremarkable. No pancreatic ductal dilatation or surrounding inflammatory changes. Spleen: Normal in size without focal abnormality. Adrenals/Urinary Tract: The adrenal glands unremarkable. There is no hydronephrosis on either side. There is symmetric enhancement and excretion of contrast by both kidneys. Mild bilateral perinephric stranding, nonspecific. The visualized ureters and the urinary bladder appear unremarkable. Stomach/Bowel: There is sigmoid diverticulosis without active  inflammatory changes. There is moderate stool throughout the colon. Several top-normal caliber distal small bowel loops with fecalized content may represent increased transit time or small intestinal bacterial overgrowth. Clinical correlation is recommended. There is no bowel obstruction or active inflammation. The appendix is normal. Vascular/Lymphatic: Advanced aortoiliac atherosclerotic disease. The IVC is unremarkable. No portal venous gas. There is no adenopathy. Reproductive: The prostate and seminal vesicles are grossly unremarkable. No pelvic mass. Other: None Musculoskeletal: Osteopenia with degenerative changes of the spine. Old compression fracture of superior endplate of L3 as seen on the prior lumbar CT of 04/11/2020. L5-S1 posterior fusion and grade 1 anterolisthesis. There is disc desiccation and vacuum phenomena at L4-L5 and L5-S1. No acute osseous pathology. Review of the MIP images confirms the above findings. IMPRESSION: 1. No CT evidence of pulmonary embolism or aortic dissection. 2. Small somewhat loculated appearing left pleural effusion and left lung base probable rounded atelectasis, both decreased in size since the prior CT. 3. Minimal interval progression of T12 compression fracture since the lumbar spine CT of 04/11/2020. 4. Sigmoid diverticulosis. No bowel obstruction. Normal appendix. 5. Aortic Atherosclerosis (ICD10-I70.0) and Emphysema (ICD10-J43.9). Electronically Signed   By: Elgie Collard M.D.   On: 07/31/2020 19:15    Procedures Procedures   Medications Ordered in ED Medications  iohexol (OMNIPAQUE) 350 MG/ML injection 100 mL (100 mLs Intravenous Contrast Given 07/31/20 1835)    ED Course  I have reviewed the triage vital signs and the nursing notes.  Pertinent labs & imaging results that were available during my care of the patient were reviewed by me and considered in my medical decision making (see chart for details).    MDM Rules/Calculators/A&P  Patient is a 65 year old male with significant cardiac history presenting today with episodes of chest pain, shortness of breath and feeling lightheaded that have been more frequent in the last 2 days.  Lasting approximately 20 minutes or less.  Does not seem to have provoking factors.  Decreasing her current rest or when lying down.  However he is able to lie down between these episodes and have no shortness of breath.  He has no evidence of fluid overload today and has urinated approximately 20 times while in the department.  He has been having frequent bowel movements but denies any diarrhea.  Denies any infectious symptoms at this time.  Recent COVID infection and D-dimer is elevated but CT is negative for PE.  He does have a complicated left pleural effusion but appears improved from prior studies and is not having productive cough or fever.  Low suspicion for pneumonia at this time.  Patient's oxygen saturation has been normal throughout his stay in the emergency room.  He has had no further episodes of pain while he is here.  EKG with paced rhythm and patient had interrogated his pacemaker yesterday without significant findings.  Troponins today are flat at 19 and then 15 which prior troponins for the same patient have been in the 20s.  UA today with low specific gravity but no other acute findings, BMP, LFTs are within normal limits, lipase slightly elevated at 89 but denies symptoms worsening with eating.  He has not had significant vomiting and more bloating than nausea.  He has still been eating and drinking.  No recent medication changes.  BNP is 224 from 177 3 weeks ago but again patient is urinating frequently and has no specific findings for fluid overload.  CT of the abdomen pelvis without acute findings other than mild interval progression of T12 compression fracture but that is not where he is experiencing is his discomfort.  Again patient reports that he has had the symptoms before  having COVID 3 weeks ago they are just more pronounced in the last few days.  No findings to definitively suggest ACS at this time.  Patient's last echo was in 2020 when this EF of 40 to 45% and recommended following up with Dr. Sw[place]iland to have repeat evaluation and possible repeat echo.  Possible GI origin in nature.  Low suspicion for dissection or musculoskeletal cause today.  Findings discussed with the patient and his wife.  At this time feel it is reasonable for discharge but he will need close follow-up with cardiology.  Also given strict return precautions.  MDM   Amount and/or Complexity of Data Reviewed Clinical lab tests: ordered and reviewed Tests in the radiology section of CPT: ordered and reviewed Tests in the medicine section of CPT: ordered and reviewed Independent visualization of images, tracings, or specimens: yes    Final Clinical Impression(s) / ED Diagnoses Final diagnoses:  Nonspecific chest pain  SOB (shortness of breath)    Rx / DC Orders ED Discharge Orders     None        Gwyneth Sprout, MD 07/31/20 2341

## 2020-07-31 NOTE — ED Triage Notes (Signed)
Patient reports pain in his chest. Patient states the pain is sharp. Patient states he has a Orthoptist and is unsure if it is malfunctioning. Patient also reports abdominal pains.

## 2020-07-31 NOTE — ED Notes (Signed)
Medtronic Device in place - Implant Date: March 11, 2018

## 2020-07-31 NOTE — ED Notes (Signed)
Patient transported to X-ray 

## 2020-07-31 NOTE — ED Notes (Addendum)
Presents to left ant chest pain, describes as sharp. States has some shortness of breath, pain has been ongoing 3 days. States he had Covid 3 weeks ago. Has BiVent AICD in place. Appears comfortable at this time. Has also been having a lot of stomach pains, had frequent visits to the restroom to have a BM.

## 2020-08-10 NOTE — Progress Notes (Signed)
Remote ICD transmission.   

## 2020-08-13 NOTE — Progress Notes (Signed)
Electrophysiology Office Note Date: 08/14/2020  ID:  Khary, Schaben [birth year], MRN [id number]  PCP: [name], PA-C Primary Cardiologist: Peter Swaziland, MD Electrophysiologist: Sherryl Manges, MD   CC: Routine ICD follow-up  [name] is a 65 y.o. male seen today for Sherryl Manges, MD for routine electrophysiology followup.  Since last being seen in our clinic the patient reports doing well.    Seen in ED 7/27 with chest pain. Recent COVID infection confounded. Left pleural effusion was noted. HS troponins flat with downtrend 19 -> 15.   He continues to have intermittent, but atypical chest pain. He can swim or ride his stationary bike for 45-60 minutes without SOB or CP, but then turn around and have SOB walking up the stairs. He has mild orthopnea. Was in [place] last week and legs "swoll up like balloons". Denies overt dietary non-compliance. Denies palpitations, PND, nausea, vomiting, dizziness, syncope, weight gain, or early satiety. He has not had ICD shocks.   Device History: MDT CRT-D, implanted 03/11/18, ICM w/syncope  Past Medical History:  Diagnosis Date   Anxiety    Arthritis    "mild; back, neck, spine" (06/09/2017)   CHF (congestive heart failure) (HCC)    Chronic back pain    Coronary artery disease    History of gout    History of kidney stones X 2   Hypercholesterolemia    Hypertension    LBBB (left bundle branch block)    MI (myocardial infarction) (HCC) 1993   LATERAL   MI (myocardial infarction) (HCC) ?09/2001; 06/07/2017   Pneumonia    "several times" (06/09/2017)   S/P coronary artery stent placement    RCA   Varicose veins    Past Surgical History:  Procedure Laterality Date   ANTERIOR CERVICAL DECOMP/DISCECTOMY FUSION  03/03/2011   Procedure: ANTERIOR CERVICAL DECOMPRESSION/DISCECTOMY FUSION 3 LEVELS;  Surgeon: Karn Cassis, MD;  Location: [facility];  Service: Neurosurgery;  Laterality: N/A;  Cervical three-four Cervical four-five  Cervical five-six Anterior cervical decompression/diskectomy, fusion   BACK SURGERY     CARDIAC CATHETERIZATION  2009   Stents in RCA patent. 70 to 80% PL, and 50% ostial PD.    CARDIAC CATHETERIZATION N/A 11/20/2014   Procedure: Left Heart Cath and Coronary Angiography;  Surgeon: Peter M Swaziland, MD;  Location: [facility] INVASIVE CV LAB;  Service: Cardiovascular;  Laterality: N/A;   CORONARY ANGIOPLASTY     DIRECT ANGIOPLASTY THE MARGINAL BRANCH   CORONARY ANGIOPLASTY WITH STENT PLACEMENT     RCA   CORONARY ARTERY BYPASS GRAFT N/A 06/14/2017   Procedure: CORONARY ARTERY BYPASS GRAFTING (CABG) x four, using left internal mammary artery and right leg greater saphenous vein harvested endoscopically;  Surgeon: Kerin Perna, MD;  Location: [facility] OR;  Service: Open Heart Surgery;  Laterality: N/A;   CYSTOSCOPY N/A 06/14/2017   Procedure: CYSTOSCOPY;  Surgeon: Kerin Perna, MD;  Location: [facility] OR;  Service: Open Heart Surgery;  Laterality: N/A;   ICD IMPLANT N/A 03/11/2018   Procedure: ICD IMPLANT - Dual Chamber;  Surgeon: [name], MD;  Location: [facility] INVASIVE CV LAB;  Service: Cardiovascular;  Laterality: N/A;   INSERTION OF SUPRAPUBIC CATHETER N/A 06/14/2017   Procedure: INSERTION OF SUPRAPUBIC CATHETER - Lower abdomen;  Surgeon: Kerin Perna, MD;  Location: [facility] OR;  Service: Open Heart Surgery;  Laterality: N/A;   POSTERIOR LUMBAR FUSION  10/2011   RIGHT/LEFT HEART CATH AND CORONARY ANGIOGRAPHY N/A 06/08/2017   Procedure:  RIGHT/LEFT HEART CATH AND CORONARY ANGIOGRAPHY;  Surgeon: Swaziland, Peter M, MD;  Location: [facility] INVASIVE CV LAB;  Service: Cardiovascular;  Laterality: N/A;   TEE WITHOUT CARDIOVERSION N/A 06/14/2017   Procedure: TRANSESOPHAGEAL ECHOCARDIOGRAM (TEE);  Surgeon: Donata Clay, Theron Arista, MD;  Location: [facility] OR;  Service: Open Heart Surgery;  Laterality: N/A;   URETHROPLASTY N/A 10/15/2017   Procedure: URETHROPLASTY WITH BUCCAL GRAFT HARVAST;  Surgeon: Crist Fat, MD;  Location: [facility] ORS;   Service: Urology;  Laterality: N/A;    Current Outpatient Medications  Medication Sig Dispense Refill   acetaminophen (TYLENOL) 500 MG tablet Take 1,000 mg by mouth 2 (two) times daily.     aspirin 81 MG EC tablet Take 81 mg by mouth daily.      carvedilol (COREG) 6.25 MG tablet Take 0.5 tablets (3.125 mg total) by mouth 2 (two) times daily with a meal. Must have office visit for additional refills [phone] 60 tablet 0   ENTRESTO 24-26 MG TAKE 1/2 TABLET BY MOUTH 2 TIMES DAILY 90 tablet 2   esomeprazole (NEXIUM) 20 MG capsule TAKE 1 CAPSULE BY MOUTH EVERY DAY 90 capsule 2   furosemide (LASIX) 20 MG tablet Take 1 tablet (20 mg total) by mouth as needed. 30 tablet 3   isosorbide mononitrate (IMDUR) 30 MG 24 hr tablet Take 1 tablet (30 mg total) by mouth daily. 90 tablet 3   Multiple Vitamins-Minerals (MENS MULTIVITAMIN PLUS PO) Take 1 tablet by mouth 2 (two) times daily.     nitroGLYCERIN (NITROSTAT) 0.4 MG SL tablet Place 1 tablet (0.4 mg total) under the tongue every 5 (five) minutes as needed for chest pain. 90 tablet 3   OVER THE COUNTER MEDICATION Take 2 capsules by mouth at bedtime. Deep Sleep     Probiotic Product (PROBIOTIC PO) Take 1 capsule by mouth 2 (two) times daily.     REPATHA SURECLICK 140 MG/ML SOAJ INJECT INTO SKIN EVERY 14 DAYS 2 mL 11   rifaximin (XIFAXAN) 550 MG TABS tablet Take 1 tablet (550 mg total) by mouth 3 (three) times daily. 42 tablet 0   zolpidem (AMBIEN) 10 MG tablet Take 1 tablet (10 mg total) by mouth at bedtime as needed. for sleep 30 tablet 1   No current facility-administered medications for this visit.    Allergies:   Ezetimibe, Niacin, and Statins   Social History: Social History   Socioeconomic History   Marital status: Married    Spouse name: Not on file   Number of children: 1   Years of education: Not on file   Highest education level: Not on file  Occupational History   Occupation: Truck Airline pilot  Tobacco Use   Smoking status: Former     Packs/day: 2.00    Years: 20.00    Pack years: 40.00    Types: Cigarettes    Quit date: 05/07/1999    Years since quitting: 21.2   Smokeless tobacco: Never  Vaping Use   Vaping Use: Never used  Substance and Sexual Activity   Alcohol use: Yes    Alcohol/week: 1.0 standard drink    Types: 1 Cans of beer per week   Drug use: Never   Sexual activity: Not on file  Other Topics Concern   Not on file  Social History Narrative   Married, works in Airline pilot for large trucks.   11/2019.   Social Determinants of Health   Financial Resource Strain: Not on file  Food Insecurity: Not on file  Transportation Needs: Not  on file  Physical Activity: Not on file  Stress: Not on file  Social Connections: Not on file  Intimate Partner Violence: Not on file    Family History: Family History  Problem Relation Age of Onset   Heart attack Father    Diabetes Father    Breast cancer Mother    Coronary artery disease Mother    Heart disease Brother    Heart attack Maternal Grandfather     Review of Systems: All other systems reviewed and are otherwise negative except as noted above.   Physical Exam: Vitals:   08/14/20 0940  BP: (!) 142/82  Pulse: 89  SpO2: 97%  Weight: 224 lb 3.2 oz (101.7 kg)  Height: 6\' 2"  (1.88 m)     GEN- The patient is well appearing, alert and oriented x 3 today.   HEENT: normocephalic, atraumatic; sclera clear, conjunctiva pink; hearing intact; oropharynx clear; neck supple, no JVP Lymph- no cervical lymphadenopathy Lungs- Clear to ausculation bilaterally, normal work of breathing.  No wheezes, rales, rhonchi Heart- Regular rate and rhythm, no murmurs, rubs or gallops, PMI not laterally displaced GI- soft, non-tender, non-distended, bowel sounds present, no hepatosplenomegaly Extremities- no clubbing or cyanosis. No edema; DP/PT/radial pulses 2+ bilaterally MS- no significant deformity or atrophy Skin- warm and dry, no rash or lesion; ICD pocket well  healed Psych- euthymic mood, full affect Neuro- strength and sensation are intact  ICD interrogation- reviewed in detail today,  See PACEART report  EKG:  EKG is not ordered today. Personal review of EKG ordered  07/31/20  shows AS-BiV pacing at 75 bpm with initial downward deflection lead 1 but mostly upright, initial isolectric line and positive deflection in V1, but mostly downward. QRS 154 ms.   Recent Labs: 12/05/2019: TSH 2.550 07/31/2020: ALT 22; B Natriuretic Peptide 224.0; BUN 25; Creatinine, Ser 0.95; Hemoglobin 15.6; Platelets 205; Potassium 4.6; Sodium 138   Wt Readings from Last 3 Encounters:  08/14/20 224 lb 3.2 oz (101.7 kg)  07/06/20 226 lb (102.5 kg)  02/05/20 226 lb 3.2 oz (102.6 kg)     Other studies Reviewed: Additional studies/ records that were reviewed today include: Previous EP office notes, gen cards notes, ED notes. Most recent CXR.    Assessment and Plan:  1.  Chronic systolic dysfunction s/p Medtronic CRT-D  With interval improval of EF Echo 07/11/2018 40-45% NYHA II-III symptoms Volume status mildly elevated today. Will add as needed lasix for weight gain of 3 lbs overnight or 5 lbs within one week. Stable on an appropriate medical regimen Normal ICD function See Pace Art report No changes today  2. ICM s/p CABG x 4 Recently evaluated in ED for chest pain Work up unremarkable.  On ASA, Repatha C/w Dr. 09/11/2018 Add imdur 30 mg daily Fill NTG to use prn. Instructed can take up to 3 tablets, if pain not relieved should call 911.  He has had intermittent atypical chest pain recently. With it's sporadic nature, will start with Myoview. He will already see Dr. Swaziland the following week for further planning pending results and symptoms on nitrites.   Current medicines are reviewed at length with the patient today.   The patient does not have concerns regarding his medicines.  The following changes were made today:  Meds as above. Recent labs  reviewed.  Labs/ tests ordered today include:  Orders Placed This Encounter  Procedures   Myocardial Perfusion Imaging    Disposition:   Follow up with EP APP in 3  months. Consider CRT optimization at that time. His LV lead is epicardial so optimization options will be limited. If symptoms persist despite optimization, he would also be reasonable candidate for Barostim pending further work up.   Dustin Flock, PA-C  08/14/2020 10:29 AM  [facility] [address] [place] [address] [phone] (office) [phone] (fax)

## 2020-08-14 ENCOUNTER — Telehealth (HOSPITAL_COMMUNITY): Payer: Self-pay | Admitting: *Deleted

## 2020-08-14 ENCOUNTER — Ambulatory Visit (INDEPENDENT_AMBULATORY_CARE_PROVIDER_SITE_OTHER): Payer: PPO | Admitting: Student

## 2020-08-14 ENCOUNTER — Encounter: Payer: Self-pay | Admitting: Student

## 2020-08-14 ENCOUNTER — Other Ambulatory Visit: Payer: Self-pay

## 2020-08-14 VITALS — BP 142/82 | HR 89 | Ht 74.0 in | Wt 224.2 lb

## 2020-08-14 DIAGNOSIS — I255 Ischemic cardiomyopathy: Secondary | ICD-10-CM

## 2020-08-14 DIAGNOSIS — Z951 Presence of aortocoronary bypass graft: Secondary | ICD-10-CM

## 2020-08-14 DIAGNOSIS — R0789 Other chest pain: Secondary | ICD-10-CM | POA: Diagnosis not present

## 2020-08-14 DIAGNOSIS — I5022 Chronic systolic (congestive) heart failure: Secondary | ICD-10-CM

## 2020-08-14 DIAGNOSIS — I447 Left bundle-branch block, unspecified: Secondary | ICD-10-CM

## 2020-08-14 LAB — CUP PACEART INCLINIC DEVICE CHECK
Battery Remaining Longevity: 23 mo
Battery Voltage: 2.93 V
Brady Statistic AP VP Percent: 0.18 %
Brady Statistic AP VS Percent: 0.06 %
Brady Statistic AS VP Percent: 99.28 %
Brady Statistic AS VS Percent: 0.49 %
Brady Statistic RA Percent Paced: 0.24 %
Brady Statistic RV Percent Paced: 98.52 %
Date Time Interrogation Session: 20220810102504
HighPow Impedance: 83 Ohm
Implantable Lead Implant Date: 20190610
Implantable Lead Implant Date: 20200306
Implantable Lead Implant Date: 20200306
Implantable Lead Location: 753858
Implantable Lead Location: 753859
Implantable Lead Location: 753860
Implantable Lead Model: 5071
Implantable Lead Model: 5076
Implantable Pulse Generator Implant Date: 20200306
Lead Channel Impedance Value: 342 Ohm
Lead Channel Impedance Value: 399 Ohm
Lead Channel Impedance Value: 4047 Ohm
Lead Channel Impedance Value: 4047 Ohm
Lead Channel Impedance Value: 494 Ohm
Lead Channel Impedance Value: 513 Ohm
Lead Channel Pacing Threshold Amplitude: 0.625 V
Lead Channel Pacing Threshold Amplitude: 0.875 V
Lead Channel Pacing Threshold Amplitude: 1.875 V
Lead Channel Pacing Threshold Pulse Width: 0.4 ms
Lead Channel Pacing Threshold Pulse Width: 0.4 ms
Lead Channel Pacing Threshold Pulse Width: 1 ms
Lead Channel Sensing Intrinsic Amplitude: 3.75 mV
Lead Channel Sensing Intrinsic Amplitude: 31.625 mV
Lead Channel Sensing Intrinsic Amplitude: 31.625 mV
Lead Channel Sensing Intrinsic Amplitude: 6.75 mV
Lead Channel Setting Pacing Amplitude: 1.75 V
Lead Channel Setting Pacing Amplitude: 2.5 V
Lead Channel Setting Pacing Amplitude: 3 V
Lead Channel Setting Pacing Pulse Width: 0.4 ms
Lead Channel Setting Pacing Pulse Width: 1 ms
Lead Channel Setting Sensing Sensitivity: 0.6 mV

## 2020-08-14 MED ORDER — FUROSEMIDE 20 MG PO TABS: 20.0000 mg | ORAL_TABLET | ORAL | 3 refills | Status: DC | PRN

## 2020-08-14 MED ORDER — ISOSORBIDE MONONITRATE ER 30 MG PO TB24: 30.0000 mg | ORAL_TABLET | Freq: Every day | ORAL | 3 refills | Status: DC

## 2020-08-14 MED ORDER — NITROGLYCERIN 0.4 MG SL SUBL: 0.4000 mg | SUBLINGUAL_TABLET | SUBLINGUAL | 3 refills | Status: DC | PRN

## 2020-08-14 NOTE — Telephone Encounter (Signed)
Patient given detailed instructions per Myocardial Perfusion Study Information Sheet for the test on 08/21/20 at 0730. Patient notified to arrive 15 minutes early and that it is imperative to arrive on time for appointment to keep from having the test rescheduled.  If you need to cancel or reschedule your appointment, please call the office within 24 hours of your appointment. . Patient verbalized understanding.Jefry Lesinski, Adelene Idler No mychart

## 2020-08-14 NOTE — Patient Instructions (Signed)
Medication Instructions:  Your physician has recommended you make the following change in your medication:   START: Furosemide 20mg  as needed START: Isosorbide 30mg  daily START: Nitroglycerin 0.4mg  as needed  *If you need a refill on your cardiac medications before your next appointment, please call your pharmacy*   Lab Work: None If you have labs (blood work) drawn today and your tests are completely normal, you will receive your results only by: MyChart Message (if you have MyChart) OR A paper copy in the mail If you have any lab test that is abnormal or we need to change your treatment, we will call you to review the results.   Testing/Procedures: Your physician has requested that you have a lexiscan myoview. For further information please visit . Please follow instruction sheet, as given.   Follow-Up: At [facility], you and your health needs are our priority.  As part of our continuing mission to provide you with exceptional heart care, we have created designated Provider Care Teams.  These Care Teams include your primary Cardiologist (physician) and Advanced Practice Providers (APPs -  Physician Assistants and Nurse Practitioners) who all work together to provide you with the care you need, when you need it.  We recommend signing up for the patient portal called "MyChart".  Sign up information is provided on this After Visit Summary.  MyChart is used to connect with patients for Virtual Visits (Telemedicine).  Patients are able to view lab/test results, encounter notes, upcoming appointments, etc.  Non-urgent messages can be sent to your provider as well.   To learn more about what you can do with MyChart, go to [email or web address]    Your next appointment:   11/14/2020  The format for your next appointment:   In Person  Provider:   [email or web address] "13/10/2020" Casimiro Needle, Mardelle Matte

## 2020-08-19 ENCOUNTER — Other Ambulatory Visit: Payer: Self-pay | Admitting: Medical

## 2020-08-21 ENCOUNTER — Other Ambulatory Visit: Payer: Self-pay

## 2020-08-21 ENCOUNTER — Ambulatory Visit (HOSPITAL_COMMUNITY): Payer: PPO | Attending: Cardiovascular Disease

## 2020-08-21 VITALS — Ht 74.0 in | Wt 224.0 lb

## 2020-08-21 DIAGNOSIS — R0789 Other chest pain: Secondary | ICD-10-CM

## 2020-08-21 DIAGNOSIS — R110 Nausea: Secondary | ICD-10-CM | POA: Diagnosis not present

## 2020-08-21 LAB — MYOCARDIAL PERFUSION IMAGING
LV dias vol: 248 mL (ref 62–150)
LV sys vol: 205 mL
Peak HR: 94 {beats}/min
Rest HR: 82 {beats}/min
SDS: 7
SRS: 9
SSS: 14
TID: 0.95

## 2020-08-21 MED ORDER — TECHNETIUM TC 99M TETROFOSMIN IV KIT
10.9000 | PACK | Freq: Once | INTRAVENOUS | Status: AC | PRN
  Administered 2020-08-21: 10.9 via INTRAVENOUS
  Filled 2020-08-21: qty 11

## 2020-08-21 MED ORDER — AMINOPHYLLINE 25 MG/ML IV SOLN
75.0000 mg | Freq: Once | INTRAVENOUS | Status: AC
Start: 2020-08-21 — End: 2020-08-21
  Administered 2020-08-21: 75 mg via INTRAVENOUS

## 2020-08-21 MED ORDER — REGADENOSON 0.4 MG/5ML IV SOLN
0.4000 mg | Freq: Once | INTRAVENOUS | Status: AC
  Administered 2020-08-21: 0.4 mg via INTRAVENOUS

## 2020-08-21 MED ORDER — TECHNETIUM TC 99M TETROFOSMIN IV KIT
30.9000 | PACK | Freq: Once | INTRAVENOUS | Status: AC | PRN
  Administered 2020-08-21: 30.9 via INTRAVENOUS
  Filled 2020-08-21: qty 31

## 2020-08-22 ENCOUNTER — Other Ambulatory Visit: Payer: Self-pay

## 2020-08-22 DIAGNOSIS — I25810 Atherosclerosis of coronary artery bypass graft(s) without angina pectoris: Secondary | ICD-10-CM

## 2020-08-22 DIAGNOSIS — I5022 Chronic systolic (congestive) heart failure: Secondary | ICD-10-CM

## 2020-08-25 NOTE — Progress Notes (Signed)
Cardiology Office Note:    Date:  08/29/2020   ID:  BROCK LARMON, DOB [birth year], MRN [id number]  PCP:  [name], PA-C  Cardiologist:  Missouri Lapaglia [place], MD  Electrophysiologist:  Sherryl Manges, MD   Referring MD: [name], PA-C   Chief Complaint  Patient presents with   Congestive Heart Failure   Coronary Artery Disease     History of Present Illness:    [name] is a 65 y.o. male with a hx of CAD s/p CABG, hypertension, left bundle branch block, hyperlipidemia, and chronic systolic heart failure.  Patient was admitted in June 2019 with flash pulmonary edema.  He was ruled in for NSTEMI and underwent cardiac catheterization which showed severe three-vessel CAD. He underwent CABG by Dr Donata Clay with LIMA to LAD, SVG to diagonal, SVG to ramus intermediate, and SVG to RCA.  He had a syncope in June 2019 while waiting in the neurology office due to urethral stricture.  He was noted to have wide-complex tachycardia on telemetry with heart rate of 170.  EF was 35 to 40%.  Lower extremity Doppler was negative for DVT.  CT of the chest was negative for PE.  He was seen by EP, the underlying rhythm could be either VT versus NSVT.  He was fitted with LifeVest.  Medication was cut back due to mild hypotension and orthostasis.  Repeat echocardiogram in September 2019 showed persistently low EF of 25 to 30%.  He was unable to tolerate Entresto, spironolactone and carvedilol due to orthostatic symptom and the lightheadedness.  Patient eventually underwent BIV ICD implantation in March 2020 by Dr. Graciela Husbands after failing another trial of medical therapy.  Since then, repeat echocardiogram in July 2020 did show significant improvement in EF to 40 to 45%.  Seen in ED 7/27 with chest pain. Recent COVID infection confounded. Left pleural effusion was noted. HS troponins flat with downtrend 19 -> 15.   Was seen by EP on 8/10. Noted persistent chest pain. Also notes increased dyspnea. Mild edema.  Was given lasix which he has taken a couple of times. Myoview ordered. This showed EF drop to 17% with large inferolateral scar and anteroapical ischemia. Now seen to follow up on results. Repeat Echo is pending. He also is having bad back pain. Ended up retiring early with this. Has seen [facility] and was planning on having disc surgery soon.    Past Medical History:  Diagnosis Date   Anxiety    Arthritis    "mild; back, neck, spine" (06/09/2017)   CHF (congestive heart failure) (HCC)    Chronic back pain    Coronary artery disease    History of gout    History of kidney stones X 2   Hypercholesterolemia    Hypertension    LBBB (left bundle branch block)    MI (myocardial infarction) (HCC) 1993   LATERAL   MI (myocardial infarction) (HCC) ?09/2001; 06/07/2017   Pneumonia    "several times" (06/09/2017)   S/P coronary artery stent placement    RCA   Varicose veins     Past Surgical History:  Procedure Laterality Date   ANTERIOR CERVICAL DECOMP/DISCECTOMY FUSION  03/03/2011   Procedure: ANTERIOR CERVICAL DECOMPRESSION/DISCECTOMY FUSION 3 LEVELS;  Surgeon: Karn Cassis, MD;  Location: [facility];  Service: Neurosurgery;  Laterality: N/A;  Cervical three-four Cervical four-five Cervical five-six Anterior cervical decompression/diskectomy, fusion   BACK SURGERY     CARDIAC CATHETERIZATION  2009  Stents in RCA patent. 70 to 80% PL, and 50% ostial PD.    CARDIAC CATHETERIZATION N/A 11/20/2014   Procedure: Left Heart Cath and Coronary Angiography;  Surgeon: Snyder Colavito M Abisai Coble, MD;  Location: [facility];  Service: Cardiovascular;  Laterality: N/A;   CORONARY ANGIOPLASTY     DIRECT ANGIOPLASTY THE MARGINAL BRANCH   CORONARY ANGIOPLASTY WITH STENT PLACEMENT     RCA   CORONARY ARTERY BYPASS GRAFT N/A 06/14/2017   Procedure: CORONARY ARTERY BYPASS GRAFTING (CABG) x four, using left internal mammary artery and right leg greater saphenous vein harvested endoscopically;   Surgeon: Van Trigt, Nilan Iddings, MD;  Location: [facility];  Service: Open Heart Surgery;  Laterality: N/A;   CYSTOSCOPY N/A 06/14/2017   Procedure: CYSTOSCOPY;  Surgeon: Van Trigt, Meliyah Simon, MD;  Location: [facility];  Service: Open Heart Surgery;  Laterality: N/A;   ICD IMPLANT N/A 03/11/2018   Procedure: ICD IMPLANT - Dual Chamber;  Surgeon: Klein, Steven C, MD;  Location: [facility];  Service: Cardiovascular;  Laterality: N/A;   INSERTION OF SUPRAPUBIC CATHETER N/A 06/14/2017   Procedure: INSERTION OF SUPRAPUBIC CATHETER - Lower abdomen;  Surgeon: Van Trigt, Emanuelle Bastos, MD;  Location: [facility];  Service: Open Heart Surgery;  Laterality: N/A;   POSTERIOR LUMBAR FUSION  10/2011   RIGHT/LEFT HEART CATH AND CORONARY ANGIOGRAPHY N/A 06/08/2017   Procedure: RIGHT/LEFT HEART CATH AND CORONARY ANGIOGRAPHY;  Surgeon: Lea Baine M, MD;  Location: [facility];  Service: Cardiovascular;  Laterality: N/A;   TEE WITHOUT CARDIOVERSION N/A 06/14/2017   Procedure: TRANSESOPHAGEAL ECHOCARDIOGRAM (TEE);  Surgeon: Van Trigt, Docie Abramovich, MD;  Location: [facility];  Service: Open Heart Surgery;  Laterality: N/A;   URETHROPLASTY N/A 10/15/2017   Procedure: URETHROPLASTY WITH BUCCAL GRAFT HARVAST;  Surgeon: Herrick, Benjamin W, MD;  Location: [facility];  Service: Urology;  Laterality: N/A;    Current Medications: Current Meds  Medication Sig   acetaminophen (TYLENOL) 500 MG tablet Take 1,000 mg by mouth 2 (two) times daily.   aspirin 81 MG EC tablet Take 81 mg by mouth daily.    carvedilol (COREG) 6.25 MG tablet Take 0.5 tablets (3.125 mg total) by mouth 2 (two) times daily with a meal. Must have office visit for additional refills [phone]   ENTRESTO 24-26 MG TAKE 1/2 TABLET BY MOUTH 2 TIMES DAILY   esomeprazole (NEXIUM) 20 MG capsule TAKE 1 CAPSULE BY MOUTH EVERY DAY   furosemide (LASIX) 20 MG tablet Take 1 tablet (20 mg total) by mouth as needed.   isosorbide mononitrate (IMDUR) 30 MG 24 hr tablet Take 1 tablet (30 mg total) by  mouth daily.   Multiple Vitamins-Minerals (MENS MULTIVITAMIN PLUS PO) Take 1 tablet by mouth 2 (two) times daily.   nitroGLYCERIN (NITROSTAT) 0.4 MG SL tablet Place 1 tablet (0.4 mg total) under the tongue every 5 (five) minutes as needed for chest pain.   OVER THE COUNTER MEDICATION Take 2 capsules by mouth at bedtime. Deep Sleep   Probiotic Product (PROBIOTIC PO) Take 1 capsule by mouth 2 (two) times daily.   REPATHA SURECLICK 140 MG/ML SOAJ INJECT INTO SKIN EVERY 14 DAYS   zolpidem (AMBIEN) 10 MG tablet TAKE 1 TABLET BY MOUTH AT BEDTIME AS NEEDED FOR SLEEP     Allergies:   Ezetimibe, Niacin, and Statins   Social History   Socioeconomic History   Marital status: Married    Spouse name: Not on file   Number of children: 1   Years of   education: Not on file   Highest education level: Not on file  Occupational History   Occupation: Truck sales  Tobacco Use   Smoking status: Former    Packs/day: 2.00    Years: 20.00    Pack years: 40.00    Types: Cigarettes    Quit date: 05/07/1999    Years since quitting: 21.3   Smokeless tobacco: Never  Vaping Use   Vaping Use: Never used  Substance and Sexual Activity   Alcohol use: Yes    Alcohol/week: 1.0 standard drink    Types: 1 Cans of beer per week   Drug use: Never   Sexual activity: Not on file  Other Topics Concern   Not on file  Social History Narrative   Married, works in Airline pilot for large trucks.   11/2019.   Social Determinants of Health   Financial Resource Strain: Not on file  Food Insecurity: Not on file  Transportation Needs: Not on file  Physical Activity: Not on file  Stress: Not on file  Social Connections: Not on file     Family History: The patient's family history includes Breast cancer in his mother; Coronary artery disease in his mother; Diabetes in his father; Heart attack in his father and maternal grandfather; Heart disease in his brother.  ROS:   Please see the history of present illness.     All  other systems reviewed and are negative.  EKGs/Labs/Other Studies Reviewed:    The following studies were reviewed today:  Echo 07/11/2018 1. The left ventricle has mild-moderately reduced systolic function, with an ejection fraction of 40-45%. The cavity size was normal. Left ventricular diastolic Doppler parameters are indeterminate. Indeterminate filling pressures.  2. The right ventricle has normal systolic function. The cavity was normal. There is no increase in right ventricular wall thickness. Right ventricular systolic pressure could not be assessed.  3. Left atrial size was mildly dilated.  4. Aortic valve regurgitation is mild to moderate by color flow Doppler. No stenosis of the aortic valve.  5. The Ascending aorta measures 39 mm, within normal limits when indexed to body surface area.  6. When compared to the prior study: 01/21/2018- LV ejection fraction has improved. Ventricular septal contraction is less dysynchronous. Aortic valve regurgitation has not significantly changed. Side by side comparison of images performed.  Myoview 08/21/20: Study Highlights    Nuclear stress EF: 17%. The left ventricular ejection fraction is severely decreased (<30%). There was no ST segment deviation noted during stress. Defect 1: There is a large defect of severe severity present in the basal inferior, basal inferolateral, mid inferior, mid inferolateral and apical inferior location. Defect 2: There is a small defect of mild severity present in the apical anterior location. This is a high risk study. Findings consistent with ischemia and prior myocardial infarction.   High risk study due to severely decreased left ventricular function. There is a large (old) inferolateral scar. There is a small area of mild anteroapical ischemia. There has been a marked reduction in left ventricular systolic function since 2018.  Recommend correlation with echo and consider possible "balanced  ischemia". Compared to ECG from 02/05/2020, there is a marked reduction in the amplitude of the initial R wave in V1, although the initial deflection is still positive. Visually, there appears to be substantial septal-lateral dyssynchrony. Consider reevaluation of CRT device settings.      EKG:  EKG is not ordered today.    Recent Labs: 12/05/2019: TSH 2.550 07/31/2020: ALT 22;  B Natriuretic Peptide 224.0; BUN 25; Creatinine, Ser 0.95; Hemoglobin 15.6; Platelets 205; Potassium 4.6; Sodium 138  Recent Lipid Panel    Component Value Date/Time   CHOL 120 01/02/2019 0923   TRIG 236 (H) 01/02/2019 0923   HDL 43 01/02/2019 0923   CHOLHDL 2.8 01/02/2019 0923   CHOLHDL 2.6 10/29/2017 0939   VLDL 27 10/29/2017 0939   LDLCALC 40 01/02/2019 0923    Physical Exam:    VS:  BP 122/80   Pulse 85   Resp 20   Ht 6\' 2"  (1.88 m)   Wt 230 lb 3.2 oz (104.4 kg)   SpO2 97%   BMI 29.56 kg/m     Wt Readings from Last 3 Encounters:  08/29/20 230 lb 3.2 oz (104.4 kg)  08/21/20 224 lb (101.6 kg)  08/14/20 224 lb 3.2 oz (101.7 kg)     GEN:  Well nourished, well developed in no acute distress HEENT: Normal NECK: No JVD; No carotid bruits LYMPHATICS: No lymphadenopathy CARDIAC: RRR, no murmurs, rubs, gallops. + pain on palpation left precordium. RESPIRATORY:  Clear to auscultation without rales, wheezing or rhonchi  ABDOMEN: Soft, non-tender, non-distended MUSCULOSKELETAL:  No edema; No deformity  SKIN: Warm and dry NEUROLOGIC:  Alert and oriented x 3 PSYCHIATRIC:  Normal affect   ASSESSMENT:    1. Acute on chronic combined systolic and diastolic CHF (congestive heart failure) (HCC)   2. Coronary artery disease of bypass graft of native heart with stable angina pectoris (HCC)   3. LBBB (left bundle branch block)   4. Hypercholesterolemia     PLAN:    In order of problems listed above:  CAD s/p CABG 2019: recent onset of chest pain in last 2 months. Abnormal Myoview with large  inferolateral scar and anteroapical ischemia. EF has dropped significantly from baseline. Recommend right and left heart cath with grafts.  Continue Aspirin and carvedilol. Will plan on doing this Sept 2. The procedure and risks were reviewed including but not limited to death, myocardial infarction, stroke, arrythmias, bleeding, transfusion, emergency surgery, dye allergy, or renal dysfunction. The patient voices understanding and is agreeable to proceed.  Hyperlipidemia: On Repatha. Statin intolerant. Will update lipid panel.    Acute on Chronic combined systolic/diastolic  heart failure: Last EF by Echo was 40-45% post CABG and BiV ICD. On Coreg and Entresto now at low dose. Previously medication titration limited by low BP. Will need to reevaluate. Plan [facility]. Possible rechallenge with aldactone. Consider SGLT 2 inhibitor.   Status post biventricular ICD: Followed by EP.   recent device interrogation did not mention any ventricular tachycardia.   GERD  6.   Lumbar disc disease. Clearance for surgery will depend on findings above.      Medication Adjustments/Labs and Tests Ordered: Current medicines are reviewed at length with the patient today.  Concerns regarding medicines are outlined above.  No orders of the defined types were placed in this encounter.  No orders of the defined types were placed in this encounter.   There are no Patient Instructions on file for this visit.   Signed, Curtez Brallier [facility], MD  08/29/2020 4:43 PM    [facility]

## 2020-08-25 NOTE — H&P (View-Only) (Signed)
Cardiology Office Note:    Date:  08/29/2020   ID:  BROCK LARMON, DOB [birth year], MRN [id number]  PCP:  [name], PA-C  Cardiologist:  Concha Sudol [place], MD  Electrophysiologist:  Sherryl Manges, MD   Referring MD: [name], PA-C   Chief Complaint  Patient presents with   Congestive Heart Failure   Coronary Artery Disease     History of Present Illness:    [name] is a 65 y.o. male with a hx of CAD s/p CABG, hypertension, left bundle branch block, hyperlipidemia, and chronic systolic heart failure.  Patient was admitted in June 2019 with flash pulmonary edema.  He was ruled in for NSTEMI and underwent cardiac catheterization which showed severe three-vessel CAD. He underwent CABG by Dr Donata Clay with LIMA to LAD, SVG to diagonal, SVG to ramus intermediate, and SVG to RCA.  He had a syncope in June 2019 while waiting in the neurology office due to urethral stricture.  He was noted to have wide-complex tachycardia on telemetry with heart rate of 170.  EF was 35 to 40%.  Lower extremity Doppler was negative for DVT.  CT of the chest was negative for PE.  He was seen by EP, the underlying rhythm could be either VT versus NSVT.  He was fitted with LifeVest.  Medication was cut back due to mild hypotension and orthostasis.  Repeat echocardiogram in September 2019 showed persistently low EF of 25 to 30%.  He was unable to tolerate Entresto, spironolactone and carvedilol due to orthostatic symptom and the lightheadedness.  Patient eventually underwent BIV ICD implantation in March 2020 by Dr. Graciela Husbands after failing another trial of medical therapy.  Since then, repeat echocardiogram in July 2020 did show significant improvement in EF to 40 to 45%.  Seen in ED 7/27 with chest pain. Recent COVID infection confounded. Left pleural effusion was noted. HS troponins flat with downtrend 19 -> 15.   Was seen by EP on 8/10. Noted persistent chest pain. Also notes increased dyspnea. Mild edema.  Was given lasix which he has taken a couple of times. Myoview ordered. This showed EF drop to 17% with large inferolateral scar and anteroapical ischemia. Now seen to follow up on results. Repeat Echo is pending. He also is having bad back pain. Ended up retiring early with this. Has seen [facility] and was planning on having disc surgery soon.    Past Medical History:  Diagnosis Date   Anxiety    Arthritis    "mild; back, neck, spine" (06/09/2017)   CHF (congestive heart failure) (HCC)    Chronic back pain    Coronary artery disease    History of gout    History of kidney stones X 2   Hypercholesterolemia    Hypertension    LBBB (left bundle branch block)    MI (myocardial infarction) (HCC) 1993   LATERAL   MI (myocardial infarction) (HCC) ?09/2001; 06/07/2017   Pneumonia    "several times" (06/09/2017)   S/P coronary artery stent placement    RCA   Varicose veins     Past Surgical History:  Procedure Laterality Date   ANTERIOR CERVICAL DECOMP/DISCECTOMY FUSION  03/03/2011   Procedure: ANTERIOR CERVICAL DECOMPRESSION/DISCECTOMY FUSION 3 LEVELS;  Surgeon: Karn Cassis, MD;  Location: [facility];  Service: Neurosurgery;  Laterality: N/A;  Cervical three-four Cervical four-five Cervical five-six Anterior cervical decompression/diskectomy, fusion   BACK SURGERY     CARDIAC CATHETERIZATION  2009  Stents in RCA patent. 70 to 80% PL, and 50% ostial PD.    CARDIAC CATHETERIZATION N/A 11/20/2014   Procedure: Left Heart Cath and Coronary Angiography;  Surgeon: Halee Glynn M [place], MD;  Location: [facility] INVASIVE CV LAB;  Service: Cardiovascular;  Laterality: N/A;   CORONARY ANGIOPLASTY     DIRECT ANGIOPLASTY THE MARGINAL BRANCH   CORONARY ANGIOPLASTY WITH STENT PLACEMENT     RCA   CORONARY ARTERY BYPASS GRAFT N/A 06/14/2017   Procedure: CORONARY ARTERY BYPASS GRAFTING (CABG) x four, using left internal mammary artery and right leg greater saphenous vein harvested endoscopically;   Surgeon: Kerin Perna, MD;  Location: [facility] OR;  Service: Open Heart Surgery;  Laterality: N/A;   CYSTOSCOPY N/A 06/14/2017   Procedure: CYSTOSCOPY;  Surgeon: Kerin Perna, MD;  Location: [facility] OR;  Service: Open Heart Surgery;  Laterality: N/A;   ICD IMPLANT N/A 03/11/2018   Procedure: ICD IMPLANT - Dual Chamber;  Surgeon: [name], MD;  Location: [facility]VASIVE CV LAB;  Service: Cardiovascular;  Laterality: N/A;   INSERTION OF SUPRAPUBIC CATHETER N/A 06/14/2017   Procedure: INSERTION OF SUPRAPUBIC CATHETER - Lower abdomen;  Surgeon: Kerin Perna, MD;  Location: [facility] OR;  Service: Open Heart Surgery;  Laterality: N/A;   POSTERIOR LUMBAR FUSION  10/2011   RIGHT/LEFT HEART CATH AND CORONARY ANGIOGRAPHY N/A 06/08/2017   Procedure: RIGHT/LEFT HEART CATH AND CORONARY ANGIOGRAPHY;  Surgeon: [place], Miro Balderson M, MD;  Location: [facility] INVASIVE CV LAB;  Service: Cardiovascular;  Laterality: N/A;   TEE WITHOUT CARDIOVERSION N/A 06/14/2017   Procedure: TRANSESOPHAGEAL ECHOCARDIOGRAM (TEE);  Surgeon: Donata Clay, Theron Arista, MD;  Location: [facility] OR;  Service: Open Heart Surgery;  Laterality: N/A;   URETHROPLASTY N/A 10/15/2017   Procedure: URETHROPLASTY WITH BUCCAL GRAFT HARVAST;  Surgeon: Crist Fat, MD;  Location: [facility] ORS;  Service: Urology;  Laterality: N/A;    Current Medications: Current Meds  Medication Sig   acetaminophen (TYLENOL) 500 MG tablet Take 1,000 mg by mouth 2 (two) times daily.   aspirin 81 MG EC tablet Take 81 mg by mouth daily.    carvedilol (COREG) 6.25 MG tablet Take 0.5 tablets (3.125 mg total) by mouth 2 (two) times daily with a meal. Must have office visit for additional refills [phone]   ENTRESTO 24-26 MG TAKE 1/2 TABLET BY MOUTH 2 TIMES DAILY   esomeprazole (NEXIUM) 20 MG capsule TAKE 1 CAPSULE BY MOUTH EVERY DAY   furosemide (LASIX) 20 MG tablet Take 1 tablet (20 mg total) by mouth as needed.   isosorbide mononitrate (IMDUR) 30 MG 24 hr tablet Take 1 tablet (30 mg total) by  mouth daily.   Multiple Vitamins-Minerals (MENS MULTIVITAMIN PLUS PO) Take 1 tablet by mouth 2 (two) times daily.   nitroGLYCERIN (NITROSTAT) 0.4 MG SL tablet Place 1 tablet (0.4 mg total) under the tongue every 5 (five) minutes as needed for chest pain.   OVER THE COUNTER MEDICATION Take 2 capsules by mouth at bedtime. Deep Sleep   Probiotic Product (PROBIOTIC PO) Take 1 capsule by mouth 2 (two) times daily.   REPATHA SURECLICK 140 MG/ML SOAJ INJECT INTO SKIN EVERY 14 DAYS   zolpidem (AMBIEN) 10 MG tablet TAKE 1 TABLET BY MOUTH AT BEDTIME AS NEEDED FOR SLEEP     Allergies:   Ezetimibe, Niacin, and Statins   Social History   Socioeconomic History   Marital status: Married    Spouse name: Not on file   Number of children: 1   Years of  education: Not on file   Highest education level: Not on file  Occupational History   Occupation: Truck sales  Tobacco Use   Smoking status: Former    Packs/day: 2.00    Years: 20.00    Pack years: 40.00    Types: Cigarettes    Quit date: 05/07/1999    Years since quitting: 21.3   Smokeless tobacco: Never  Vaping Use   Vaping Use: Never used  Substance and Sexual Activity   Alcohol use: Yes    Alcohol/week: 1.0 standard drink    Types: 1 Cans of beer per week   Drug use: Never   Sexual activity: Not on file  Other Topics Concern   Not on file  Social History Narrative   Married, works in Airline pilot for large trucks.   11/2019.   Social Determinants of Health   Financial Resource Strain: Not on file  Food Insecurity: Not on file  Transportation Needs: Not on file  Physical Activity: Not on file  Stress: Not on file  Social Connections: Not on file     Family History: The patient's family history includes Breast cancer in his mother; Coronary artery disease in his mother; Diabetes in his father; Heart attack in his father and maternal grandfather; Heart disease in his brother.  ROS:   Please see the history of present illness.     All  other systems reviewed and are negative.  EKGs/Labs/Other Studies Reviewed:    The following studies were reviewed today:  Echo 07/11/2018 1. The left ventricle has mild-moderately reduced systolic function, with an ejection fraction of 40-45%. The cavity size was normal. Left ventricular diastolic Doppler parameters are indeterminate. Indeterminate filling pressures.  2. The right ventricle has normal systolic function. The cavity was normal. There is no increase in right ventricular wall thickness. Right ventricular systolic pressure could not be assessed.  3. Left atrial size was mildly dilated.  4. Aortic valve regurgitation is mild to moderate by color flow Doppler. No stenosis of the aortic valve.  5. The Ascending aorta measures 39 mm, within normal limits when indexed to body surface area.  6. When compared to the prior study: 01/21/2018- LV ejection fraction has improved. Ventricular septal contraction is less dysynchronous. Aortic valve regurgitation has not significantly changed. Side by side comparison of images performed.  Myoview 08/21/20: Study Highlights    Nuclear stress EF: 17%. The left ventricular ejection fraction is severely decreased (<30%). There was no ST segment deviation noted during stress. Defect 1: There is a large defect of severe severity present in the basal inferior, basal inferolateral, mid inferior, mid inferolateral and apical inferior location. Defect 2: There is a small defect of mild severity present in the apical anterior location. This is a high risk study. Findings consistent with ischemia and prior myocardial infarction.   High risk study due to severely decreased left ventricular function. There is a large (old) inferolateral scar. There is a small area of mild anteroapical ischemia. There has been a marked reduction in left ventricular systolic function since 2018.  Recommend correlation with echo and consider possible "balanced  ischemia". Compared to ECG from 02/05/2020, there is a marked reduction in the amplitude of the initial R wave in V1, although the initial deflection is still positive. Visually, there appears to be substantial septal-lateral dyssynchrony. Consider reevaluation of CRT device settings.      EKG:  EKG is not ordered today.    Recent Labs: 12/05/2019: TSH 2.550 07/31/2020: ALT 22;  B Natriuretic Peptide 224.0; BUN 25; Creatinine, Ser 0.95; Hemoglobin 15.6; Platelets 205; Potassium 4.6; Sodium 138  Recent Lipid Panel    Component Value Date/Time   CHOL 120 01/02/2019 0923   TRIG 236 (H) 01/02/2019 0923   HDL 43 01/02/2019 0923   CHOLHDL 2.8 01/02/2019 0923   CHOLHDL 2.6 10/29/2017 0939   VLDL 27 10/29/2017 0939   LDLCALC 40 01/02/2019 0923    Physical Exam:    VS:  BP 122/80   Pulse 85   Resp 20   Ht 6\' 2"  (1.88 m)   Wt 230 lb 3.2 oz (104.4 kg)   SpO2 97%   BMI 29.56 kg/m     Wt Readings from Last 3 Encounters:  08/29/20 230 lb 3.2 oz (104.4 kg)  08/21/20 224 lb (101.6 kg)  08/14/20 224 lb 3.2 oz (101.7 kg)     GEN:  Well nourished, well developed in no acute distress HEENT: Normal NECK: No JVD; No carotid bruits LYMPHATICS: No lymphadenopathy CARDIAC: RRR, no murmurs, rubs, gallops. + pain on palpation left precordium. RESPIRATORY:  Clear to auscultation without rales, wheezing or rhonchi  ABDOMEN: Soft, non-tender, non-distended MUSCULOSKELETAL:  No edema; No deformity  SKIN: Warm and dry NEUROLOGIC:  Alert and oriented x 3 PSYCHIATRIC:  Normal affect   ASSESSMENT:    1. Acute on chronic combined systolic and diastolic CHF (congestive heart failure) (HCC)   2. Coronary artery disease of bypass graft of native heart with stable angina pectoris (HCC)   3. LBBB (left bundle branch block)   4. Hypercholesterolemia     PLAN:    In order of problems listed above:  CAD s/p CABG 2019: recent onset of chest pain in last 2 months. Abnormal Myoview with large  inferolateral scar and anteroapical ischemia. EF has dropped significantly from baseline. Recommend right and left heart cath with grafts.  Continue Aspirin and carvedilol. Will plan on doing this Sept 2. The procedure and risks were reviewed including but not limited to death, myocardial infarction, stroke, arrythmias, bleeding, transfusion, emergency surgery, dye allergy, or renal dysfunction. The patient voices understanding and is agreeable to proceed.  Hyperlipidemia: On Repatha. Statin intolerant. Will update lipid panel.    Acute on Chronic combined systolic/diastolic  heart failure: Last EF by Echo was 40-45% post CABG and BiV ICD. On Coreg and Entresto now at low dose. Previously medication titration limited by low BP. Will need to reevaluate. Plan [facility]. Possible rechallenge with aldactone. Consider SGLT 2 inhibitor.   Status post biventricular ICD: Followed by EP.   recent device interrogation did not mention any ventricular tachycardia.   GERD  6.   Lumbar disc disease. Clearance for surgery will depend on findings above.      Medication Adjustments/Labs and Tests Ordered: Current medicines are reviewed at length with the patient today.  Concerns regarding medicines are outlined above.  No orders of the defined types were placed in this encounter.  No orders of the defined types were placed in this encounter.   There are no Patient Instructions on file for this visit.   Signed, Dazia Lippold [facility], MD  08/29/2020 4:43 PM    [facility]

## 2020-08-26 DIAGNOSIS — Z9889 Other specified postprocedural states: Secondary | ICD-10-CM | POA: Diagnosis not present

## 2020-08-26 DIAGNOSIS — M25561 Pain in right knee: Secondary | ICD-10-CM | POA: Diagnosis not present

## 2020-08-26 DIAGNOSIS — M5416 Radiculopathy, lumbar region: Secondary | ICD-10-CM | POA: Diagnosis not present

## 2020-08-26 DIAGNOSIS — M25551 Pain in right hip: Secondary | ICD-10-CM | POA: Diagnosis not present

## 2020-08-28 DIAGNOSIS — M9983 Other biomechanical lesions of lumbar region: Secondary | ICD-10-CM | POA: Diagnosis not present

## 2020-08-28 DIAGNOSIS — Z6829 Body mass index (BMI) 29.0-29.9, adult: Secondary | ICD-10-CM | POA: Diagnosis not present

## 2020-08-28 DIAGNOSIS — I10 Essential (primary) hypertension: Secondary | ICD-10-CM | POA: Diagnosis not present

## 2020-08-28 DIAGNOSIS — M5136 Other intervertebral disc degeneration, lumbar region: Secondary | ICD-10-CM | POA: Diagnosis not present

## 2020-08-29 ENCOUNTER — Encounter: Payer: Self-pay | Admitting: Cardiology

## 2020-08-29 ENCOUNTER — Ambulatory Visit: Payer: PPO | Admitting: Cardiology

## 2020-08-29 ENCOUNTER — Other Ambulatory Visit: Payer: Self-pay

## 2020-08-29 ENCOUNTER — Other Ambulatory Visit: Payer: Self-pay | Admitting: Cardiology

## 2020-08-29 VITALS — BP 122/80 | HR 85 | Resp 20 | Ht 74.0 in | Wt 230.2 lb

## 2020-08-29 DIAGNOSIS — I447 Left bundle-branch block, unspecified: Secondary | ICD-10-CM

## 2020-08-29 DIAGNOSIS — I25708 Atherosclerosis of coronary artery bypass graft(s), unspecified, with other forms of angina pectoris: Secondary | ICD-10-CM

## 2020-08-29 DIAGNOSIS — I5043 Acute on chronic combined systolic (congestive) and diastolic (congestive) heart failure: Secondary | ICD-10-CM | POA: Diagnosis not present

## 2020-08-29 DIAGNOSIS — E7800 Pure hypercholesterolemia, unspecified: Secondary | ICD-10-CM

## 2020-08-29 DIAGNOSIS — I209 Angina pectoris, unspecified: Secondary | ICD-10-CM

## 2020-08-29 MED ORDER — SODIUM CHLORIDE 0.9% FLUSH: 3.0000 mL | Freq: Two times a day (BID) | INTRAVENOUS | Status: DC

## 2020-08-29 NOTE — Addendum Note (Signed)
Addended by: Neoma Laming on: 08/29/2020 05:00 PM   Modules accepted: Orders

## 2020-08-29 NOTE — Patient Instructions (Addendum)
Medication Instructions:   Continue same medications  *If you need a refill on your cardiac medications before your next appointment, please call your pharmacy*   Lab Work:  Have pre cath labs done tomorrow Friday 8/26  Lab order enclosed   Testing/Procedures:  Cardiac Cath   Follow instructions below   Follow-Up: At [facility], you and your health needs are our priority.  As part of our continuing mission to provide you with exceptional heart care, we have created designated Provider Care Teams.  These Care Teams include your primary Cardiologist (physician) and Advanced Practice Providers (APPs -  Physician Assistants and Nurse Practitioners) who all work together to provide you with the care you need, when you need it.  We recommend signing up for the patient portal called "MyChart".  Sign up information is provided on this After Visit Summary.  MyChart is used to connect with patients for Virtual Visits (Telemedicine).  Patients are able to view lab/test results, encounter notes, upcoming appointments, etc.  Non-urgent messages can be sent to your provider as well.   To learn more about what you can do with MyChart, go to [email or web address].    Your next appointment:     The format for your next appointment: Office   Provider:  Dr.Jordan        [facility] GROUP [facility] CARDIOVASCULAR DIVISION [facility] [address] [place] [address] Dept: [phone] Loc: [phone]  [name]  08/29/2020  You are scheduled for a Cardiac Cath on Friday 09/06/20, with Dr.Jordan.  1. Please arrive at the [facility] (Main Entrance A) at [facility]: [address] [place], [place] [address] at 5:30 am (This time is two hours before your procedure to ensure your preparation). Free valet parking service is available.   Special note: Every effort is made to have your procedure done on time. Please understand that  emergencies sometimes delay scheduled procedures.  2. Diet: Do not eat solid foods after midnight.  The patient may have clear liquids until 5am upon the day of the procedure.  3. Labs: You will need to have blood drawn on Friday 8/26  You will need to be fasting since we are checking a lipid panel.  4. Medication instructions in preparation for your procedure:   Hold Furosemide morning of cath   On the morning of your procedure, take your Aspirin 81 mg and any morning medicines NOT listed above.  You may use sips of water.  5. Plan for one night stay--bring personal belongings. 6. Bring a current list of your medications and current insurance cards. 7. You MUST have a responsible person to drive you home. 8. Someone MUST be with you the first 24 hours after you arrive home or your discharge will be delayed. 9. Please wear clothes that are easy to get on and off and wear slip-on shoes.  Thank you for allowing [place] to care for you!   -- [facility]

## 2020-08-30 ENCOUNTER — Telehealth: Payer: Self-pay | Admitting: Cardiology

## 2020-08-30 ENCOUNTER — Ambulatory Visit (HOSPITAL_COMMUNITY): Payer: PPO | Attending: Internal Medicine

## 2020-08-30 DIAGNOSIS — I25708 Atherosclerosis of coronary artery bypass graft(s), unspecified, with other forms of angina pectoris: Secondary | ICD-10-CM | POA: Diagnosis not present

## 2020-08-30 DIAGNOSIS — I5022 Chronic systolic (congestive) heart failure: Secondary | ICD-10-CM

## 2020-08-30 DIAGNOSIS — I25810 Atherosclerosis of coronary artery bypass graft(s) without angina pectoris: Secondary | ICD-10-CM | POA: Diagnosis not present

## 2020-08-30 DIAGNOSIS — E7800 Pure hypercholesterolemia, unspecified: Secondary | ICD-10-CM | POA: Diagnosis not present

## 2020-08-30 DIAGNOSIS — I447 Left bundle-branch block, unspecified: Secondary | ICD-10-CM | POA: Diagnosis not present

## 2020-08-30 DIAGNOSIS — I5043 Acute on chronic combined systolic (congestive) and diastolic (congestive) heart failure: Secondary | ICD-10-CM | POA: Diagnosis not present

## 2020-08-30 LAB — CBC WITH DIFFERENTIAL/PLATELET
Basophils Absolute: 0 10*3/uL (ref 0.0–0.2)
Basos: 1 %
EOS (ABSOLUTE): 0.1 10*3/uL (ref 0.0–0.4)
Eos: 2 %
Hematocrit: 43.7 % (ref 37.5–51.0)
Hemoglobin: 14.6 g/dL (ref 13.0–17.7)
Immature Grans (Abs): 0.1 10*3/uL (ref 0.0–0.1)
Immature Granulocytes: 2 %
Lymphocytes Absolute: 1.1 10*3/uL (ref 0.7–3.1)
Lymphs: 22 %
MCH: 32.2 pg (ref 26.6–33.0)
MCHC: 33.4 g/dL (ref 31.5–35.7)
MCV: 96 fL (ref 79–97)
Monocytes Absolute: 0.5 10*3/uL (ref 0.1–0.9)
Monocytes: 11 %
Neutrophils Absolute: 3.1 10*3/uL (ref 1.4–7.0)
Neutrophils: 62 %
Platelets: 208 10*3/uL (ref 150–450)
RBC: 4.54 x10E6/uL (ref 4.14–5.80)
RDW: 13.2 % (ref 11.6–15.4)
WBC: 4.9 10*3/uL (ref 3.4–10.8)

## 2020-08-30 LAB — ECHOCARDIOGRAM COMPLETE
Area-P 1/2: 5.27 cm2
P 1/2 time: 260 msec
S' Lateral: 4.7 cm

## 2020-08-30 LAB — COMPREHENSIVE METABOLIC PANEL
ALT: 21 IU/L (ref 0–44)
AST: 22 IU/L (ref 0–40)
Albumin/Globulin Ratio: 2.3 — ABNORMAL HIGH (ref 1.2–2.2)
Albumin: 4.6 g/dL (ref 3.8–4.8)
Alkaline Phosphatase: 58 IU/L (ref 44–121)
BUN/Creatinine Ratio: 17 (ref 10–24)
BUN: 15 mg/dL (ref 8–27)
Bilirubin Total: 0.6 mg/dL (ref 0.0–1.2)
CO2: 23 mmol/L (ref 20–29)
Calcium: 9.5 mg/dL (ref 8.6–10.2)
Chloride: 101 mmol/L (ref 96–106)
Creatinine, Ser: 0.87 mg/dL (ref 0.76–1.27)
Globulin, Total: 2 g/dL (ref 1.5–4.5)
Glucose: 119 mg/dL — ABNORMAL HIGH (ref 65–99)
Potassium: 4.7 mmol/L (ref 3.5–5.2)
Sodium: 140 mmol/L (ref 134–144)
Total Protein: 6.6 g/dL (ref 6.0–8.5)
eGFR: 95 mL/min/{1.73_m2} (ref >59)

## 2020-08-30 LAB — PT AND PTT
INR: 1 (ref 0.9–1.2)
Prothrombin Time: 10.4 s (ref 9.1–12.0)
aPTT: 26 s (ref 24–33)

## 2020-08-30 LAB — LIPID PANEL
Chol/HDL Ratio: 2.8 ratio (ref 0.0–5.0)
Cholesterol, Total: 153 mg/dL (ref 100–199)
HDL: 54 mg/dL (ref >39)
LDL Chol Calc (NIH): 71 mg/dL (ref 0–99)
Triglycerides: 168 mg/dL — ABNORMAL HIGH (ref 0–149)
VLDL Cholesterol Cal: 28 mg/dL (ref 5–40)

## 2020-08-30 MED ORDER — PERFLUTREN LIPID MICROSPHERE
1.0000 mL | INTRAVENOUS | Status: AC | PRN
  Administered 2020-08-30: 3 mL via INTRAVENOUS

## 2020-08-30 NOTE — Telephone Encounter (Signed)
Pt is returning call from 2:18pm today

## 2020-08-30 NOTE — Telephone Encounter (Signed)
Spoke to patient he stated he wanted to ask Dr.Jordan if ok to have a epidural back injection before he has cardiac cath 9/2. Spoke to Dr.Jordan he advised not to have epidural back injection yet.

## 2020-08-30 NOTE — Telephone Encounter (Signed)
Left message to call back  

## 2020-08-30 NOTE — Telephone Encounter (Signed)
Pt is having an injection at [facility] on 09/05/20 and he's having a heart cath on 09/06/20, pt wants to know is it ok. I asked pt to call [facility] to have them call for a Pre op clearance and she said he's already had 30 back injections in the past and [facility] have never had to call for a clearance with Dr. [place]

## 2020-09-03 ENCOUNTER — Ambulatory Visit (INDEPENDENT_AMBULATORY_CARE_PROVIDER_SITE_OTHER): Payer: PPO | Admitting: Medical

## 2020-09-03 ENCOUNTER — Other Ambulatory Visit: Payer: Self-pay

## 2020-09-03 ENCOUNTER — Telehealth: Payer: Self-pay | Admitting: *Deleted

## 2020-09-03 VITALS — BP 122/80 | HR 88 | Ht 72.75 in | Wt 226.8 lb

## 2020-09-03 DIAGNOSIS — Z1211 Encounter for screening for malignant neoplasm of colon: Secondary | ICD-10-CM

## 2020-09-03 DIAGNOSIS — R7301 Impaired fasting glucose: Secondary | ICD-10-CM

## 2020-09-03 DIAGNOSIS — F419 Anxiety disorder, unspecified: Secondary | ICD-10-CM | POA: Diagnosis not present

## 2020-09-03 DIAGNOSIS — Z0000 Encounter for general adult medical examination without abnormal findings: Secondary | ICD-10-CM | POA: Insufficient documentation

## 2020-09-03 DIAGNOSIS — E7800 Pure hypercholesterolemia, unspecified: Secondary | ICD-10-CM

## 2020-09-03 DIAGNOSIS — Z8739 Personal history of other diseases of the musculoskeletal system and connective tissue: Secondary | ICD-10-CM

## 2020-09-03 DIAGNOSIS — Z125 Encounter for screening for malignant neoplasm of prostate: Secondary | ICD-10-CM

## 2020-09-03 DIAGNOSIS — M109 Gout, unspecified: Secondary | ICD-10-CM

## 2020-09-03 DIAGNOSIS — I5022 Chronic systolic (congestive) heart failure: Secondary | ICD-10-CM

## 2020-09-03 DIAGNOSIS — I10 Essential (primary) hypertension: Secondary | ICD-10-CM

## 2020-09-03 DIAGNOSIS — R5383 Other fatigue: Secondary | ICD-10-CM | POA: Diagnosis not present

## 2020-09-03 DIAGNOSIS — Z951 Presence of aortocoronary bypass graft: Secondary | ICD-10-CM | POA: Diagnosis not present

## 2020-09-03 NOTE — Progress Notes (Signed)
Subjective:    [name] is a 65 y.o. male who presents for Preventative Services visit and chronic medical problems/med check visit.   Chief Complaint  Patient presents with   nonfasting cpe     Welcome to medicare. No other concerns     Primary Care Provider Tysinger, Kermit Balo, PA-C here for primary care  Current Health Care Team: Dentist, Dr. Noreene Filbert doctor, Dr. Hyacinth Meeker vision Dr. Peter Swaziland, cardiology Dr. Lisbeth Renshaw, neurosurgery Dr. Aileen Fass, pain/neurosurgery Dr. Amada Jupiter, GI Dr. Sherryl Manges,  electrophysiology [facility]  Medical Services you may have received from other than [facility] providers in the past year (date may be approximate) Dr. Swaziland- Cardiology Dr. Graciela Husbands- pacemaker  Exercise Current exercise habits:  daily    Nutrition/Diet Current diet: in general, a "healthy" diet  , well balanced  Depression Screen Depression screen [facility] 2/9 09/03/2020  Decreased Interest 0  Down, Depressed, Hopeless 0  PHQ - 2 Score 0  Some recent data might be hidden    Activities of Daily Living Screen/Functional Status Survey Is the patient deaf or have difficulty hearing?: No Does the patient have difficulty seeing, even when wearing glasses/contacts?: No Does the patient have difficulty concentrating, remembering, or making decisions?: No Does the patient have difficulty walking or climbing stairs?: Yes (back pain) Does the patient have difficulty dressing or bathing?: No Does the patient have difficulty doing errands alone such as visiting a doctor's office or shopping?: No  Can patient draw a clock face showing 3:15 oclock, yes  Fall Risk Screen Fall Risk  09/03/2020 01/11/2020  Falls in the past year? 0 0  Number falls in past yr: 0 -  Injury with Fall? 0 -  Risk for fall due to : No Fall Risks No Fall Risks  Follow up Falls evaluation completed Falls evaluation completed    Gait Assessment: Normal gait observed yes  Advanced  directives Does patient have a Health Care Power of Attorney? No Does patient have a Living Will? No  Past Medical History:  Diagnosis Date   Anxiety    Arthritis    "mild; back, neck, spine" (06/09/2017)   CHF (congestive heart failure) (HCC)    Chronic back pain    Coronary artery disease    History of gout    History of kidney stones X 2   Hypercholesterolemia    Hypertension    LBBB (left bundle branch block)    MI (myocardial infarction) (HCC) 1993   LATERAL   MI (myocardial infarction) (HCC) ?09/2001; 06/07/2017   Pneumonia    "several times" (06/09/2017)   S/P coronary artery stent placement    RCA   Varicose veins     Past Surgical History:  Procedure Laterality Date   ANTERIOR CERVICAL DECOMP/DISCECTOMY FUSION  03/03/2011   Procedure: ANTERIOR CERVICAL DECOMPRESSION/DISCECTOMY FUSION 3 LEVELS;  Surgeon: Karn Cassis, MD;  Location: [facility];  Service: Neurosurgery;  Laterality: N/A;  Cervical three-four Cervical four-five Cervical five-six Anterior cervical decompression/diskectomy, fusion   BACK SURGERY     CARDIAC CATHETERIZATION  2009   Stents in RCA patent. 70 to 80% PL, and 50% ostial PD.    CARDIAC CATHETERIZATION N/A 11/20/2014   Procedure: Left Heart Cath and Coronary Angiography;  Surgeon: Peter M Swaziland, MD;  Location: [facility] INVASIVE CV LAB;  Service: Cardiovascular;  Laterality: N/A;   CORONARY ANGIOPLASTY     DIRECT ANGIOPLASTY THE MARGINAL BRANCH   CORONARY ANGIOPLASTY WITH STENT PLACEMENT  RCA   CORONARY ARTERY BYPASS GRAFT N/A 06/14/2017   Procedure: CORONARY ARTERY BYPASS GRAFTING (CABG) x four, using left internal mammary artery and right leg greater saphenous vein harvested endoscopically;  Surgeon: Ivin Poot, MD;  Location: [place];  Service: Open Heart Surgery;  Laterality: N/A;   CYSTOSCOPY N/A 06/14/2017   Procedure: CYSTOSCOPY;  Surgeon: Ivin Poot, MD;  Location: [place];  Service: Open Heart Surgery;  Laterality: N/A;   ICD  IMPLANT N/A 03/11/2018   Procedure: ICD IMPLANT - Dual Chamber;  Surgeon: Deboraha Sprang, MD;  Location: [place] CV LAB;  Service: Cardiovascular;  Laterality: N/A;   INSERTION OF SUPRAPUBIC CATHETER N/A 06/14/2017   Procedure: INSERTION OF SUPRAPUBIC CATHETER - Lower abdomen;  Surgeon: Ivin Poot, MD;  Location: [place];  Service: Open Heart Surgery;  Laterality: N/A;   POSTERIOR LUMBAR FUSION  10/2011   RIGHT/LEFT HEART CATH AND CORONARY ANGIOGRAPHY N/A 06/08/2017   Procedure: RIGHT/LEFT HEART CATH AND CORONARY ANGIOGRAPHY;  Surgeon: Martinique, Peter M, MD;  Location: [place] CV LAB;  Service: Cardiovascular;  Laterality: N/A;   TEE WITHOUT CARDIOVERSION N/A 06/14/2017   Procedure: TRANSESOPHAGEAL ECHOCARDIOGRAM (TEE);  Surgeon: Prescott Gum, Collier Salina, MD;  Location: [place];  Service: Open Heart Surgery;  Laterality: N/A;   URETHROPLASTY N/A 10/15/2017   Procedure: URETHROPLASTY WITH BUCCAL GRAFT HARVAST;  Surgeon: Ardis Hughs, MD;  Location: [facility];  Service: Urology;  Laterality: N/A;    Social History   Socioeconomic History   Marital status: Married    Spouse name: Not on file   Number of children: 1   Years of education: Not on file   Highest education level: Not on file  Occupational History   Occupation: Truck Press photographer  Tobacco Use   Smoking status: Former    Packs/day: 2.00    Years: 20.00    Pack years: 40.00    Types: Cigarettes    Quit date: 05/07/1999    Years since quitting: 21.3   Smokeless tobacco: Never  Vaping Use   Vaping Use: Never used  Substance and Sexual Activity   Alcohol use: Yes    Alcohol/week: 1.0 standard drink    Types: 1 Cans of beer per week   Drug use: Never   Sexual activity: Not on file  Other Topics Concern   Not on file  Social History Narrative   Married, works in Press photographer for large trucks.   11/2019.   Social Determinants of Health   Financial Resource Strain: Not on file  Food Insecurity: Not on file  Transportation Needs: Not on  file  Physical Activity: Not on file  Stress: Not on file  Social Connections: Not on file  Intimate Partner Violence: Not on file    Family History  Problem Relation Age of Onset   Heart attack Father    Diabetes Father    Breast cancer Mother    Coronary artery disease Mother    Heart disease Brother    Heart attack Maternal Grandfather      Current Outpatient Medications:    acetaminophen (TYLENOL) 500 MG tablet, Take 1,000 mg by mouth 2 (two) times daily., Disp: , Rfl:    aspirin 81 MG EC tablet, Take 81 mg by mouth daily. , Disp: , Rfl:    ENTRESTO 24-26 MG, TAKE 1/2 TABLET BY MOUTH 2 TIMES DAILY, Disp: 90 tablet, Rfl: 2   esomeprazole (NEXIUM) 20 MG capsule, TAKE 1 CAPSULE BY MOUTH EVERY DAY, Disp:  90 capsule, Rfl: 2   furosemide (LASIX) 20 MG tablet, Take 1 tablet (20 mg total) by mouth as needed., Disp: 30 tablet, Rfl: 3   isosorbide mononitrate (IMDUR) 30 MG 24 hr tablet, Take 1 tablet (30 mg total) by mouth daily., Disp: 90 tablet, Rfl: 3   Multiple Vitamins-Minerals (MENS MULTIVITAMIN PLUS PO), Take 1 tablet by mouth 2 (two) times daily., Disp: , Rfl:    OVER THE COUNTER MEDICATION, Take 2 capsules by mouth at bedtime. Deep Sleep, Disp: , Rfl:    Probiotic Product (PROBIOTIC PO), Take 1 capsule by mouth 2 (two) times daily., Disp: , Rfl:    REPATHA SURECLICK 110 MG/ML SOAJ, INJECT INTO SKIN EVERY 14 DAYS, Disp: 2 mL, Rfl: 11   zolpidem (AMBIEN) 10 MG tablet, TAKE 1 TABLET BY MOUTH AT BEDTIME AS NEEDED FOR SLEEP, Disp: 30 tablet, Rfl: 0   carvedilol (COREG) 6.25 MG tablet, Take 0.5 tablets (3.125 mg total) by mouth 2 (two) times daily with a meal. Must have office visit for additional refills [phone], Disp: 60 tablet, Rfl: 0   nitroGLYCERIN (NITROSTAT) 0.4 MG SL tablet, Place 1 tablet (0.4 mg total) under the tongue every 5 (five) minutes as needed for chest pain., Disp: 90 tablet, Rfl: 3  Allergies  Allergen Reactions   Ezetimibe Other (See Comments)    Joint pain  and drops BP   Niacin Other (See Comments)    Drops BP and severe   Statins Other (See Comments)    Severe joint pain and drops BP.    History reviewed: allergies, current medications, past family history, past medical history, past social history, past surgical history and problem list  Chronic issues discussed: Compliant with medications  Acute issues discussed: Has catheterization planned friday given recent SOB.      Objective:      Biometrics BP 122/80   Pulse 88   Ht 6' 0.75" (1.848 m)   Wt 226 lb 12.8 oz (102.9 kg)   BMI 30.13 kg/m   Gen: wd, wn, nad A&Ox 3, displays appropriate judgement, can perform simple calculations.  No loss of fat beneath skin, no loss of muscle, no edema, no diminished functional status.   HEENT: normocephalic, sclerae anicteric, TMs pearly, nares patent, no discharge or erythema, pharynx normal Neck: supple, no lymphadenopathy, no thyromegaly, no masses, no bruits Heart: RRR, normal S1, S2, no murmurs Lungs: somewhat decreased lung sounds on left in general, otherwise no wheezes, rhonchi, or rales Abdomen: +bs, soft, non tender, non distended, no masses, no hepatomegaly, no splenomegaly Musculoskeletal: nontender, no swelling, no obvious deformity Extremities: no edema, no cyanosis, no clubbing Pulses: 2+ symmetric, upper and lower extremities, normal cap refill Neurological: alert, oriented x 3, CN2-12 intact, strength normal upper extremities and lower extremities, sensation normal throughout, DTRs 2+ throughout, no cerebellar signs, gait normal Psychiatric: normal affect, behavior normal, pleasant  GU/rectal declined   Assessment:   Encounter Diagnoses  Name Primary?   Encounter for health maintenance examination in adult Yes   Medicare welcome exam    Acute gout of right foot, unspecified cause    Primary hypertension    Encounter for screening for malignant neoplasm of colon    Hypercholesterolemia    S/P CABG x 4    Anxiety     Chronic systolic heart failure (HCC)    Impaired fasting blood sugar    Fatigue, unspecified type    Screening for prostate cancer    History of gout  Plan:   A preventative services visit was completed today.  During the course of the visit today, we discussed and counseled about appropriate screening and preventive services.  A health risk assessment was established today that included a review of current medications, allergies, social history, family history, medical and preventative health history, biometrics, and preventative screenings to identify potential safety concerns or impairments.   This visit was a preventative care visit, also known as wellness visit or routine physical.   Topics typically include healthy lifestyle, diet, exercise, preventative care, vaccinations, sick and well care, proper use of emergency dept and after hours care, as well as other concerns.     Recommendations: Continue to return yearly for your annual wellness and preventative care visits.  This gives [place] a chance to discuss healthy lifestyle, exercise, vaccinations, review your chart record, and perform screenings where appropriate.  I recommend you see your eye doctor yearly for routine vision care.  I recommend you see your dentist yearly for routine dental care including hygiene visits twice yearly.   Vaccination recommendations were reviewed Immunization History  Administered Date(s) Administered   Fluad Quad(high Dose 65+) 12/05/2019   Influenza,inj,Quad PF,6+ Mos 02/17/2017, 10/16/2017   Influenza,inj,quad, With Preservative 10/18/2018   Influenza-Unspecified 10/30/2015   PFIZER(Purple Top)SARS-COV-2 Vaccination 05/27/2019, 10/31/2019   Tdap 02/27/2016   I recommend pneumococcal and flu vaccines . He will do this after cardiac cath this week  Shingles vaccine:  I recommend you have a shingles vaccine to help prevent shingles or herpes zoster outbreak.   Please call your insurer to  inquire about coverage for the Shingrix vaccine given in 2 doses.   Some insurers cover this vaccine after age 41, some cover this after age 13.  If your insurer covers this, then call to schedule appointment to have this vaccine here.   Screening for cancer: Colon cancer screening: You were given stool cards kit to return for hemoccult screening I will check with cardiology on when he is suppose to have repeat colonoscopy  Please call your insurance company to check coverage for colon cancer screening.  Options may include Cologuard stool test or Colonoscopy.  You should also inquire about which facility the colonoscopy could be performed, and coverage for diagnostic vs screening colonoscopy as coverage may vary.  If you have significant family history of colon cancer or blood in the stool, then you should only do the colonoscopy, not the Cologuard test.  We discussed PSA, prostate exam, and prostate cancer screening risks/benefits.     Skin cancer screening: Check your skin regularly for new changes, growing lesions, or other lesions of concern Come in for evaluation if you have skin lesions of concern.  Lung cancer screening: If you have a greater than 20 pack year history of tobacco use, then you may qualify for lung cancer screening with a chest CT scan.   Please call your insurance company to inquire about coverage for this test.  We currently don't have screenings for other cancers besides breast, cervical, colon, and lung cancers.  If you have a strong family history of cancer or have other cancer screening concerns, please let me know.    Bone health: Get at least 150 minutes of aerobic exercise weekly Get weight bearing exercise at least once weekly Bone density test:  A bone density test is an imaging test that uses a type of X-ray to measure the amount of calcium and other minerals in your bones. The test may be used to  diagnose or screen you for a condition that causes weak or  thin bones (osteoporosis), predict your risk for a broken bone (fracture), or determine how well your osteoporosis treatment is working. The bone density test is recommended for females 18 and older, or females or males <67 if certain risk factors such as thyroid disease, long term use of steroids such as for asthma or rheumatological issues, vitamin D deficiency, estrogen deficiency, family history of osteoporosis, self or family history of fragility fracture in first degree relative.    Heart health: Get at least 150 minutes of aerobic exercise weekly Limit alcohol It is important to maintain a healthy blood pressure and healthy cholesterol numbers  Heart disease screening: Screening for heart disease includes screening for blood pressure, fasting lipids, glucose/diabetes screening, BMI height to weight ratio, reviewed of smoking status, physical activity, and diet.    Goals include blood pressure 120/80 or less, maintaining a healthy lipid/cholesterol profile, preventing diabetes or keeping diabetes numbers under good control, not smoking or using tobacco products, exercising most days per week or at least 150 minutes per week of exercise, and eating healthy variety of fruits and vegetables, healthy oils, and avoiding unhealthy food choices like fried food, fast food, high sugar and high cholesterol foods.    Follow up with cardiology as planned and for cardiac cath this week as scheduled   Medical care options: I recommend you continue to seek care here first for routine care.  We try really hard to have available appointments Monday through Friday daytime hours for sick visits, acute visits, and physicals.  Urgent care should be used for after hours and weekends for significant issues that cannot wait till the next day.  The emergency department should be used for significant potentially life-threatening emergencies.  The emergency department is expensive, can often have long wait times for  less significant concerns, so try to utilize primary care, urgent care, or telemedicine when possible to avoid unnecessary trips to the emergency department.  Virtual visits and telemedicine have been introduced since the pandemic started in 2020, and can be convenient ways to receive medical care.  We offer virtual appointments as well to assist you in a variety of options to seek medical care.    Separate significant issues discussed: Gout - updated uric acid level.   No recent complaints  HTN, s/p CABG, heart disease, chronic systolic heart failure - managed by cardiology, has craterization this week  impaired fasting - counseled on diet, preventing progression to diabetes.    Fatigue - multifactorial, additional labs as below to screen for other causes  Hypercholesteremia - doesn't tolerate statins, on repatha, aspirin    Clever was seen today for nonfasting cpe .  Diagnoses and all orders for this visit:  Encounter for health maintenance examination in adult -     Hemoglobin A1c -     Testosterone -     TSH -     PSA -     Uric acid  Medicare welcome exam  Acute gout of right foot, unspecified cause  Primary hypertension  Encounter for screening for malignant neoplasm of colon -     Fecal occult blood, imunochemical  Hypercholesterolemia  S/P CABG x 4  Anxiety  Chronic systolic heart failure (HCC)  Impaired fasting blood sugar -     Hemoglobin A1c  Fatigue, unspecified type -     Testosterone -     TSH  Screening for prostate cancer -  PSA  History of gout -     Uric acid    Medicare Attestation A preventative services visit was completed today.  During the course of the visit the patient was educated and counseled about appropriate screening and preventive services.  A health risk assessment was established with the patient that included a review of current medications, allergies, social history, family history, medical and preventative health  history, biometrics, and preventative screenings to identify potential safety concerns or impairments.  A personalized plan was printed today for the patient's records and use.   Personalized health advice and education was given today to reduce health risks and promote self management and wellness.  Information regarding end of life planning was discussed today.  Dorothea Ogle, PA-C   09/03/2020

## 2020-09-03 NOTE — Telephone Encounter (Signed)
Patient was recently seen in in the ED for persistent chest pain and increased dyspnea. He was given Lasix and Myoview was ordered which showed a large inferolateral scare and anteroapical ischemia and a drop in EF to 17%. Repeat Echo was ordered and showed LVEF of 40% with diffuse hypokinesis in the anterior and lateral walls. He was seen by Dr. Swaziland on 08/29/2020 and outpatient [facility] was arranged. Cath scheduled for this Friday on 09/06/2020. Per Dr. Elvis Coil note, clearance for surgery will depend on cath findings. Will leave and pre-op pool so we can follow up after cath.  Corrin Parker, PA-C 09/03/2020 3:33 PM

## 2020-09-03 NOTE — Telephone Encounter (Signed)
   [facility] Pre-operative Risk Assessment    Patient Name: [name]  DOB: 04/15/1955 MRN: [id number]  HEARTCARE STAFF:  - IMPORTANT!!!!!! Under Visit Info/Reason for Call, type in Other and utilize the format Clearance MM/DD/YY or Clearance TBD. Do not use dashes or single digits. - Please review there is not already an duplicate clearance open for this procedure. - If request is for dental extraction, please clarify the # of teeth to be extracted. - If the patient is currently at the dentist's office, call Pre-Op Callback Staff (MA/nurse) to input urgent request.  - If the patient is not currently in the dentist office, please route to the Pre-Op pool.  Request for surgical clearance:  What type of surgery is being performed? LUMBAR FUSION  When is this surgery scheduled? TBD  What type of clearance is required (medical clearance vs. Pharmacy clearance to hold med vs. Both)? MEDICAL  Are there any medications that need to be held prior to surgery and how long?  ASA  Practice name and name of physician performing surgery? [facility]: DR. Consuella Lose  What is the office phone number? [phone]   7.   What is the office fax number? [place]: [email or web address]  8.   Anesthesia type (None, local, MAC, general) ? GENERAL   Julaine Hua 09/03/2020, 2:53 PM  _________________________________________________________________   (provider comments below)

## 2020-09-04 ENCOUNTER — Other Ambulatory Visit: Payer: Self-pay | Admitting: Medical

## 2020-09-04 LAB — URIC ACID: Uric Acid: 6.4 mg/dL (ref 3.8–8.4)

## 2020-09-04 LAB — TSH: TSH: 1.83 u[IU]/mL (ref 0.450–4.500)

## 2020-09-04 LAB — HEMOGLOBIN A1C
Est. average glucose Bld gHb Est-mCnc: 128 mg/dL
Hgb A1c MFr Bld: 6.1 % — ABNORMAL HIGH (ref 4.8–5.6)

## 2020-09-04 LAB — TESTOSTERONE: Testosterone: 175 ng/dL — ABNORMAL LOW (ref 264–916)

## 2020-09-04 LAB — PSA: Prostate Specific Ag, Serum: 0.2 ng/mL (ref 0.0–4.0)

## 2020-09-04 NOTE — Addendum Note (Signed)
Addended by: [name] on: 09/04/2020 01:33 PM   Modules accepted: Level of Service

## 2020-09-05 ENCOUNTER — Telehealth: Payer: Self-pay | Admitting: *Deleted

## 2020-09-05 NOTE — Telephone Encounter (Signed)
Cardiac catheterization scheduled at [facility] for: Friday September 2,2022 7:30 AM Arrive [facility] Main Entrance [facility]) at: 5:30 AM   No solid food after midnight prior to cath, clear liquids until 5 AM day of procedure.  Medication instructions: Hold: -lasix -AM of procedure  Except hold medications morning medications can be taken pre-cath with sips of water including aspirin 81 mg.    Confirmed patient has responsible adult to drive home post procedure and be with patient first 24 hours after arriving home.  Patients are allowed one visitor in the waiting room during the time they are at the hospital for their procedure. Both patient and visitor must wear a mask once they enter the hospital.   Patient reports does not currently have any symptoms concerning for COVID-19 and no household members with COVID-19 like illness.      Reviewed procedure /mask/visitor instructions with patient.

## 2020-09-06 ENCOUNTER — Encounter (HOSPITAL_COMMUNITY): Payer: Self-pay | Admitting: Cardiology

## 2020-09-06 ENCOUNTER — Other Ambulatory Visit: Payer: Self-pay

## 2020-09-06 ENCOUNTER — Ambulatory Visit (HOSPITAL_COMMUNITY)
Admission: RE | Admit: 2020-09-06 | Discharge: 2020-09-06 | Disposition: A | Discharge status: Home / Self Care | Payer: PPO | Source: Ambulatory Visit | Attending: Cardiology | Admitting: Cardiology

## 2020-09-06 ENCOUNTER — Encounter (HOSPITAL_COMMUNITY)
Admission: RE | Disposition: A | Discharge status: Home / Self Care | Payer: Self-pay | Source: Ambulatory Visit | Attending: Cardiology

## 2020-09-06 DIAGNOSIS — Z7982 Long term (current) use of aspirin: Secondary | ICD-10-CM | POA: Insufficient documentation

## 2020-09-06 DIAGNOSIS — I2582 Chronic total occlusion of coronary artery: Secondary | ICD-10-CM | POA: Insufficient documentation

## 2020-09-06 DIAGNOSIS — K219 Gastro-esophageal reflux disease without esophagitis: Secondary | ICD-10-CM | POA: Diagnosis not present

## 2020-09-06 DIAGNOSIS — I25708 Atherosclerosis of coronary artery bypass graft(s), unspecified, with other forms of angina pectoris: Secondary | ICD-10-CM | POA: Diagnosis not present

## 2020-09-06 DIAGNOSIS — I2510 Atherosclerotic heart disease of native coronary artery without angina pectoris: Secondary | ICD-10-CM | POA: Diagnosis present

## 2020-09-06 DIAGNOSIS — Z951 Presence of aortocoronary bypass graft: Secondary | ICD-10-CM

## 2020-09-06 DIAGNOSIS — Z8616 Personal history of COVID-19: Secondary | ICD-10-CM | POA: Diagnosis not present

## 2020-09-06 DIAGNOSIS — I447 Left bundle-branch block, unspecified: Secondary | ICD-10-CM | POA: Diagnosis not present

## 2020-09-06 DIAGNOSIS — E7800 Pure hypercholesterolemia, unspecified: Secondary | ICD-10-CM | POA: Diagnosis present

## 2020-09-06 DIAGNOSIS — I5022 Chronic systolic (congestive) heart failure: Secondary | ICD-10-CM | POA: Diagnosis present

## 2020-09-06 DIAGNOSIS — Z8249 Family history of ischemic heart disease and other diseases of the circulatory system: Secondary | ICD-10-CM | POA: Diagnosis not present

## 2020-09-06 DIAGNOSIS — Z888 Allergy status to other drugs, medicaments and biological substances status: Secondary | ICD-10-CM | POA: Insufficient documentation

## 2020-09-06 DIAGNOSIS — Z95810 Presence of automatic (implantable) cardiac defibrillator: Secondary | ICD-10-CM | POA: Diagnosis not present

## 2020-09-06 DIAGNOSIS — I200 Unstable angina: Secondary | ICD-10-CM | POA: Diagnosis present

## 2020-09-06 DIAGNOSIS — Z87891 Personal history of nicotine dependence: Secondary | ICD-10-CM | POA: Insufficient documentation

## 2020-09-06 DIAGNOSIS — I209 Angina pectoris, unspecified: Secondary | ICD-10-CM

## 2020-09-06 DIAGNOSIS — I110 Hypertensive heart disease with heart failure: Secondary | ICD-10-CM | POA: Diagnosis not present

## 2020-09-06 DIAGNOSIS — I10 Essential (primary) hypertension: Secondary | ICD-10-CM | POA: Diagnosis present

## 2020-09-06 DIAGNOSIS — Z79899 Other long term (current) drug therapy: Secondary | ICD-10-CM | POA: Insufficient documentation

## 2020-09-06 DIAGNOSIS — I5043 Acute on chronic combined systolic (congestive) and diastolic (congestive) heart failure: Secondary | ICD-10-CM | POA: Diagnosis not present

## 2020-09-06 DIAGNOSIS — I25119 Atherosclerotic heart disease of native coronary artery with unspecified angina pectoris: Secondary | ICD-10-CM | POA: Diagnosis not present

## 2020-09-06 HISTORY — PX: RIGHT/LEFT HEART CATH AND CORONARY/GRAFT ANGIOGRAPHY: CATH118267

## 2020-09-06 LAB — POCT I-STAT EG7
Acid-Base Excess: 2 mmol/L (ref 0.0–2.0)
Acid-Base Excess: 3 mmol/L — ABNORMAL HIGH (ref 0.0–2.0)
Bicarbonate: 27.7 mmol/L (ref 20.0–28.0)
Bicarbonate: 28.8 mmol/L — ABNORMAL HIGH (ref 20.0–28.0)
Calcium, Ion: 1.25 mmol/L (ref 1.15–1.40)
Calcium, Ion: 1.26 mmol/L (ref 1.15–1.40)
HCT: 39 % (ref 39.0–52.0)
HCT: 40 % (ref 39.0–52.0)
Hemoglobin: 13.3 g/dL (ref 13.0–17.0)
Hemoglobin: 13.6 g/dL (ref 13.0–17.0)
O2 Saturation: 69 %
O2 Saturation: 68 %
Potassium: 3.8 mmol/L (ref 3.5–5.1)
Potassium: 3.9 mmol/L (ref 3.5–5.1)
Sodium: 141 mmol/L (ref 135–145)
Sodium: 141 mmol/L (ref 135–145)
TCO2: 29 mmol/L (ref 22–32)
TCO2: 30 mmol/L (ref 22–32)
pCO2, Ven: 47.2 mmHg (ref 44.0–60.0)
pCO2, Ven: 49 mmHg (ref 44.0–60.0)
pH, Ven: 7.376 (ref 7.250–7.430)
pH, Ven: 7.378 (ref 7.250–7.430)
pO2, Ven: 37 mmHg (ref 32.0–45.0)
pO2, Ven: 37 mmHg (ref 32.0–45.0)

## 2020-09-06 LAB — POCT I-STAT 7, (LYTES, BLD GAS, ICA,H+H)
Acid-base deficit: 2 mmol/L (ref 0.0–2.0)
Bicarbonate: 23.5 mmol/L (ref 20.0–28.0)
Calcium, Ion: 1.22 mmol/L (ref 1.15–1.40)
HCT: 38 % — ABNORMAL LOW (ref 39.0–52.0)
Hemoglobin: 12.9 g/dL — ABNORMAL LOW (ref 13.0–17.0)
O2 Saturation: 94 %
Potassium: 3.5 mmol/L (ref 3.5–5.1)
Sodium: 130 mmol/L — ABNORMAL LOW (ref 135–145)
TCO2: 25 mmol/L (ref 22–32)
pCO2 arterial: 42.9 mmHg (ref 32.0–48.0)
pH, Arterial: 7.347 — ABNORMAL LOW (ref 7.350–7.450)
pO2, Arterial: 76 mmHg — ABNORMAL LOW (ref 83.0–108.0)

## 2020-09-06 SURGERY — RIGHT/LEFT HEART CATH AND CORONARY/GRAFT ANGIOGRAPHY
Anesthesia: LOCAL

## 2020-09-06 MED ORDER — SODIUM CHLORIDE 0.9 % IV SOLN: INTRAVENOUS | Status: DC

## 2020-09-06 MED ORDER — FENTANYL CITRATE (PF) 100 MCG/2ML IJ SOLN
INTRAMUSCULAR | Status: AC
  Filled 2020-09-06: qty 2

## 2020-09-06 MED ORDER — LIDOCAINE HCL (PF) 1 % IJ SOLN
INTRAMUSCULAR | Status: AC
  Filled 2020-09-06: qty 30

## 2020-09-06 MED ORDER — EMPAGLIFLOZIN 10 MG PO TABS: 10.0000 mg | ORAL_TABLET | Freq: Every day | ORAL | 3 refills | Status: DC

## 2020-09-06 MED ORDER — ASPIRIN 81 MG PO CHEW: 81.0000 mg | CHEWABLE_TABLET | ORAL | Status: DC

## 2020-09-06 MED ORDER — SODIUM CHLORIDE 0.9% FLUSH: 3.0000 mL | INTRAVENOUS | Status: DC | PRN

## 2020-09-06 MED ORDER — IOHEXOL 350 MG/ML SOLN
INTRAVENOUS | Status: DC | PRN
  Administered 2020-09-06: 125 mL via INTRA_ARTERIAL

## 2020-09-06 MED ORDER — SODIUM CHLORIDE 0.9 % IV SOLN: 250.0000 mL | INTRAVENOUS | Status: DC | PRN

## 2020-09-06 MED ORDER — SODIUM CHLORIDE 0.9% FLUSH: 3.0000 mL | Freq: Two times a day (BID) | INTRAVENOUS | Status: DC

## 2020-09-06 MED ORDER — FENTANYL CITRATE (PF) 100 MCG/2ML IJ SOLN
INTRAMUSCULAR | Status: DC | PRN
  Administered 2020-09-06: 25 ug via INTRAVENOUS
  Administered 2020-09-06: 50 ug via INTRAVENOUS

## 2020-09-06 MED ORDER — HYDRALAZINE HCL 20 MG/ML IJ SOLN: 10.0000 mg | INTRAMUSCULAR | Status: DC | PRN

## 2020-09-06 MED ORDER — HEPARIN (PORCINE) IN NACL 1000-0.9 UT/500ML-% IV SOLN
INTRAVENOUS | Status: DC | PRN
  Administered 2020-09-06 (×2): 500 mL

## 2020-09-06 MED ORDER — MIDAZOLAM HCL 2 MG/2ML IJ SOLN
INTRAMUSCULAR | Status: AC
  Filled 2020-09-06: qty 2

## 2020-09-06 MED ORDER — MIDAZOLAM HCL 2 MG/2ML IJ SOLN
INTRAMUSCULAR | Status: DC | PRN
  Administered 2020-09-06: 2 mg via INTRAVENOUS

## 2020-09-06 MED ORDER — SODIUM CHLORIDE 0.9 % WEIGHT BASED INFUSION: 1.0000 mL/kg/h | INTRAVENOUS | Status: DC

## 2020-09-06 MED ORDER — ONDANSETRON HCL 4 MG/2ML IJ SOLN: 4.0000 mg | Freq: Four times a day (QID) | INTRAMUSCULAR | Status: DC | PRN

## 2020-09-06 MED ORDER — ACETAMINOPHEN 325 MG PO TABS: 650.0000 mg | ORAL_TABLET | ORAL | Status: DC | PRN

## 2020-09-06 MED ORDER — HEPARIN (PORCINE) IN NACL 1000-0.9 UT/500ML-% IV SOLN
INTRAVENOUS | Status: AC
  Filled 2020-09-06: qty 1000

## 2020-09-06 MED ORDER — LIDOCAINE HCL (PF) 1 % IJ SOLN
INTRAMUSCULAR | Status: DC | PRN
  Administered 2020-09-06: 15 mL

## 2020-09-06 SURGICAL SUPPLY — 13 items
CATH INFINITI 5 FR IM (CATHETERS) ×1 IMPLANT
CATH INFINITI 5FR AL1 (CATHETERS) ×1 IMPLANT
CATH INFINITI 5FR MULTPACK ANG (CATHETERS) ×1 IMPLANT
CATH SWAN GANZ 7F STRAIGHT (CATHETERS) ×1 IMPLANT
CLOSURE MYNX CONTROL 5F (Vascular Products) ×1 IMPLANT
KIT HEART LEFT (KITS) ×2 IMPLANT
PACK CARDIAC CATHETERIZATION (CUSTOM PROCEDURE TRAY) ×2 IMPLANT
SHEATH PINNACLE 5F 10CM (SHEATH) ×1 IMPLANT
SHEATH PINNACLE 7F 10CM (SHEATH) ×1 IMPLANT
SHEATH PROBE COVER 6X72 (BAG) ×1 IMPLANT
TRANSDUCER W/STOPCOCK (MISCELLANEOUS) ×2 IMPLANT
TUBING CIL FLEX 10 FLL-RA (TUBING) ×2 IMPLANT
WIRE EMERALD 3MM-J .035X150CM (WIRE) ×1 IMPLANT

## 2020-09-06 NOTE — Discharge Instructions (Addendum)
Add Jardiance 10 mg daily to current medication

## 2020-09-06 NOTE — Interval H&P Note (Signed)
History and Physical Interval Note:  09/06/2020 7:19 AM  [name]  has presented today for surgery, with the diagnosis of CAD.  The various methods of treatment have been discussed with the patient and family. After consideration of risks, benefits and other options for treatment, the patient has consented to  Procedure(s): RIGHT/LEFT HEART CATH AND CORONARY/GRAFT ANGIOGRAPHY (N/A) as a surgical intervention.  The patient's history has been reviewed, patient examined, no change in status, stable for surgery.  I have reviewed the patient's chart and labs.  Questions were answered to the patient's satisfaction.   Cath Lab Visit (complete for each Cath Lab visit)  Clinical Evaluation Leading to the Procedure:   ACS: Yes.    Non-ACS:    Anginal Classification: CCS III  Anti-ischemic medical therapy: Maximal Therapy (2 or more classes of medications)  Non-Invasive Test Results: High-risk stress test findings: cardiac mortality >3%/year  Prior CABG: Previous CABG        [name] [facility] 09/06/2020 7:20 AM

## 2020-09-10 ENCOUNTER — Telehealth: Payer: Self-pay | Admitting: Internal Medicine

## 2020-09-10 ENCOUNTER — Telehealth: Payer: Self-pay | Admitting: Cardiology

## 2020-09-10 NOTE — Telephone Encounter (Signed)
Patient called in to say that the office havent received the paperwork for clearance. Can the information be refax over. Please advise

## 2020-09-10 NOTE — Telephone Encounter (Signed)
Dr. Swaziland Pt had R/L [facility] 09/06/20 with 3/4 patent grafts, no intervention. You recommended PFTs and sleep study for dyspnea. Do these need to be completed prior to surgery? Or can he proceed to surgery? They also want to hold ASA - can you please give guidance for ASA hold?

## 2020-09-10 NOTE — Telephone Encounter (Signed)
Patient called and left voicemail staying that he needs clearance sent to [facility] at [facility] for back surgery. Fax # [phone]

## 2020-09-11 NOTE — Telephone Encounter (Signed)
Spoke to patient. Pt did not have stent put in for catherization and isn't having a steroid injection done. He is having back surgery and needs a letter stating he can have back surgery.

## 2020-09-11 NOTE — Telephone Encounter (Signed)
I have faxed over note to [facility] to fax over clearance form

## 2020-09-12 ENCOUNTER — Other Ambulatory Visit: Payer: Self-pay | Admitting: Neurosurgery

## 2020-09-12 NOTE — Telephone Encounter (Signed)
   Name: [name]  DOB: 1955/09/18  MRN: [id number]   Primary Cardiologist: Peter Swaziland, MD  Chart reviewed as part of pre-operative protocol coverage.  QUNICY HIGINBOTHAM was last seen on 09/06/20 by Dr. Swaziland.  On that day, he had a right and left heart cath that showed 3/4 patent grafts, no intervention.   Per Dr. Swaziland: He can proceed with surgery. May hold ASA for 5 days.  Therefore, based on ACC/AHA guidelines, the patient would be at acceptable risk for the planned procedure without further cardiovascular testing.    I will route this recommendation to the requesting party via Epic fax function and remove from pre-op pool. Please call with questions.  Roe Rutherford Aliese Brannum, PA 09/12/2020, 6:39 AM

## 2020-09-20 ENCOUNTER — Other Ambulatory Visit: Payer: Self-pay | Admitting: Neurosurgery

## 2020-09-23 ENCOUNTER — Encounter: Payer: Self-pay | Admitting: Internal Medicine

## 2020-09-23 DIAGNOSIS — M5416 Radiculopathy, lumbar region: Secondary | ICD-10-CM | POA: Diagnosis not present

## 2020-09-23 MED ORDER — HEPARIN SODIUM (PORCINE) 1000 UNIT/ML IJ SOLN
INTRAMUSCULAR | Status: AC
  Filled 2020-09-23: qty 1

## 2020-09-23 MED ORDER — FENTANYL CITRATE (PF) 100 MCG/2ML IJ SOLN
INTRAMUSCULAR | Status: AC
  Filled 2020-09-23: qty 2

## 2020-09-23 MED ORDER — MIDAZOLAM HCL 2 MG/2ML IJ SOLN
INTRAMUSCULAR | Status: AC
  Filled 2020-09-23: qty 2

## 2020-09-23 NOTE — Pre-Procedure Instructions (Signed)
Surgical Instructions   Your procedure is scheduled on Thursday, September 29th. Report to [facility] Main Entrance "A" at 10:30 A.M., then check in with the Admitting office. Call this number if you have problems the morning of surgery: [phone]   If you have any questions prior to your surgery date call [phone]: Open Monday-Friday 8am-4pm   Remember: Do not eat or drink after midnight the night before your surgery     Take these medicines the morning of surgery with A SIP OF WATER  carvedilol (COREG) esomeprazole (NEXIUM)   If needed: acetaminophen (TYLENOL) nitroGLYCERIN (NITROSTAT)- if you need to use this, let your RN know on the day of surgery  Follow surgeon's instructions on when to stop taking Aspirin.  As of today, STOP taking any Aspirin (unless otherwise instructed by your surgeon) Aleve, Naproxen, Ibuprofen, Motrin, Advil, Goody's, BC's, all herbal medications, fish oil, and all vitamins.                       Do NOT Smoke (Tobacco/Vaping)  24 hours prior to your procedure If you use a CPAP at night, you may bring your mask for your overnight stay.   Contacts, glasses, dentures or bridgework may not be worn into surgery, please bring cases for these belongings   For patients admitted to the hospital, discharge time will be determined by your treatment team.   Patients discharged the day of surgery will not be allowed to drive home, and someone needs to stay with them for 24 hours.  NO VISITORS WILL BE ALLOWED IN PRE-OP WHERE PATIENTS GET READY FOR SURGERY.  ONLY 1 SUPPORT PERSON MAY BE PRESENT WHILE YOU ARE IN SURGERY.  IF YOU ARE TO BE ADMITTED, ONCE YOU ARE IN YOUR ROOM YOU WILL BE ALLOWED TWO (2) VISITORS.  Minor children may have two parents present. Special consideration for safety and communication needs will be reviewed on a case by case basis.  Special instructions:    Oral Hygiene is also important to reduce your risk of infection.  Remember -  BRUSH YOUR TEETH THE MORNING OF SURGERY WITH YOUR REGULAR TOOTHPASTE   [place]- Preparing For Surgery  Before surgery, you can play an important role. Because skin is not sterile, your skin needs to be as free of germs as possible. You can reduce the number of germs on your skin by washing with CHG (chlorahexidine gluconate) Soap before surgery.  CHG is an antiseptic cleaner which kills germs and bonds with the skin to continue killing germs even after washing.     Please do not use if you have an allergy to CHG or antibacterial soaps. If your skin becomes reddened/irritated stop using the CHG.  Do not shave (including legs and underarms) for at least 48 hours prior to first CHG shower. It is OK to shave your face.  Please follow these instructions carefully.     Shower the NIGHT BEFORE SURGERY and the MORNING OF SURGERY with CHG Soap.   If you chose to wash your hair, wash your hair first as usual with your normal shampoo. After you shampoo, rinse your hair and body thoroughly to remove the shampoo.  Then [employer] and genitals (private parts) with your normal soap and rinse thoroughly to remove soap.  After that Use CHG Soap as you would any other liquid soap. You can apply CHG directly to the skin and wash gently with a scrungie or a clean washcloth.   Apply  the CHG Soap to your body ONLY FROM THE NECK DOWN.  Do not use on open wounds or open sores. Avoid contact with your eyes, ears, mouth and genitals (private parts). Wash Face and genitals (private parts)  with your normal soap.   Wash thoroughly, paying special attention to the area where your surgery will be performed.  Thoroughly rinse your body with warm water from the neck down.  DO NOT shower/wash with your normal soap after using and rinsing off the CHG Soap.  Pat yourself dry with a CLEAN TOWEL.  Wear CLEAN PAJAMAS to bed the night before surgery  Place CLEAN SHEETS on your bed the night before your surgery  DO NOT  SLEEP WITH PETS.   Day of Surgery:  Do not wear jewelry  Do not wear lotions, powders, colognes, or deodorant. Men may shave face and neck. Do not bring valuables to the hospital. [facility] is not responsible for any belongings or valuables. Take a shower with CHG soap. Wear Clean/Comfortable clothing the morning of surgery Do not apply any deodorants/lotions.   Remember to brush your teeth WITH YOUR REGULAR TOOTHPASTE.   Please read over the following fact sheets that you were given.

## 2020-09-23 NOTE — Progress Notes (Signed)
PERIOPERATIVE PRESCRIPTION FOR IMPLANTED CARDIAC DEVICE PROGRAMMING  Patient Information: Name:  [name]  DOB:  Apr 21, 1955  MRN:  [id number]    Planned Procedure:  Lumbar 4-5 fusion  Surgeon:  Dr. Lisbeth Renshaw  Date of Procedure:  10/03/20  Cautery will be used.  Position during surgery:  prone   Please send documentation back to:  [facility] (Fax # [phone])  Device Information:  Clinic EP Physician:  Hillis Range, MD   Device Type:  Defibrillator Manufacturer and Phone #:  Medtronic: [phone] Pacemaker Dependent?:  No. Date of Last Device Check:  08/14/20 Normal Device Function?:  Yes.    Electrophysiologist's Recommendations:  Have magnet available. Provide continuous ECG monitoring when magnet is used or reprogramming is to be performed.  Procedure may interfere with device function.  Magnet should be placed over device during procedure.  Per Device Clinic Standing Orders, [name], [place]  10:22 AM 09/23/2020

## 2020-09-23 NOTE — Progress Notes (Signed)
In basket message sent to [facility] for device orders.

## 2020-09-24 ENCOUNTER — Other Ambulatory Visit: Payer: Self-pay

## 2020-09-24 ENCOUNTER — Encounter (HOSPITAL_COMMUNITY)
Admission: RE | Admit: 2020-09-24 | Discharge: 2020-09-24 | Disposition: A | Discharge status: Home / Self Care | Payer: PPO | Source: Ambulatory Visit | Attending: Neurosurgery | Admitting: Neurosurgery

## 2020-09-24 ENCOUNTER — Encounter (HOSPITAL_COMMUNITY): Payer: Self-pay

## 2020-09-24 DIAGNOSIS — I10 Essential (primary) hypertension: Secondary | ICD-10-CM | POA: Insufficient documentation

## 2020-09-24 DIAGNOSIS — Z7982 Long term (current) use of aspirin: Secondary | ICD-10-CM | POA: Diagnosis not present

## 2020-09-24 DIAGNOSIS — Z79899 Other long term (current) drug therapy: Secondary | ICD-10-CM | POA: Insufficient documentation

## 2020-09-24 DIAGNOSIS — I2510 Atherosclerotic heart disease of native coronary artery without angina pectoris: Secondary | ICD-10-CM | POA: Insufficient documentation

## 2020-09-24 DIAGNOSIS — I700 Atherosclerosis of aorta: Secondary | ICD-10-CM | POA: Insufficient documentation

## 2020-09-24 DIAGNOSIS — M5136 Other intervertebral disc degeneration, lumbar region: Secondary | ICD-10-CM | POA: Insufficient documentation

## 2020-09-24 DIAGNOSIS — G8929 Other chronic pain: Secondary | ICD-10-CM | POA: Diagnosis not present

## 2020-09-24 DIAGNOSIS — Z960 Presence of urogenital implants: Secondary | ICD-10-CM | POA: Insufficient documentation

## 2020-09-24 DIAGNOSIS — Z87891 Personal history of nicotine dependence: Secondary | ICD-10-CM | POA: Insufficient documentation

## 2020-09-24 DIAGNOSIS — Z01812 Encounter for preprocedural laboratory examination: Secondary | ICD-10-CM | POA: Diagnosis not present

## 2020-09-24 DIAGNOSIS — J439 Emphysema, unspecified: Secondary | ICD-10-CM | POA: Insufficient documentation

## 2020-09-24 DIAGNOSIS — E7800 Pure hypercholesterolemia, unspecified: Secondary | ICD-10-CM | POA: Diagnosis not present

## 2020-09-24 DIAGNOSIS — I447 Left bundle-branch block, unspecified: Secondary | ICD-10-CM | POA: Insufficient documentation

## 2020-09-24 DIAGNOSIS — I252 Old myocardial infarction: Secondary | ICD-10-CM | POA: Insufficient documentation

## 2020-09-24 DIAGNOSIS — Z95810 Presence of automatic (implantable) cardiac defibrillator: Secondary | ICD-10-CM | POA: Insufficient documentation

## 2020-09-24 DIAGNOSIS — I5022 Chronic systolic (congestive) heart failure: Secondary | ICD-10-CM | POA: Insufficient documentation

## 2020-09-24 DIAGNOSIS — Z7984 Long term (current) use of oral hypoglycemic drugs: Secondary | ICD-10-CM | POA: Insufficient documentation

## 2020-09-24 DIAGNOSIS — K219 Gastro-esophageal reflux disease without esophagitis: Secondary | ICD-10-CM | POA: Insufficient documentation

## 2020-09-24 DIAGNOSIS — Z951 Presence of aortocoronary bypass graft: Secondary | ICD-10-CM | POA: Diagnosis not present

## 2020-09-24 DIAGNOSIS — Z955 Presence of coronary angioplasty implant and graft: Secondary | ICD-10-CM | POA: Insufficient documentation

## 2020-09-24 HISTORY — DX: Gastro-esophageal reflux disease without esophagitis: K21.9

## 2020-09-24 HISTORY — DX: Presence of automatic (implantable) cardiac defibrillator: Z95.810

## 2020-09-24 HISTORY — DX: Dyspnea, unspecified: R06.00

## 2020-09-24 LAB — CBC
HCT: 44.8 % (ref 39.0–52.0)
Hemoglobin: 14.8 g/dL (ref 13.0–17.0)
MCH: 33.2 pg (ref 26.0–34.0)
MCHC: 33 g/dL (ref 30.0–36.0)
MCV: 100.4 fL — ABNORMAL HIGH (ref 80.0–100.0)
Platelets: 223 10*3/uL (ref 150–400)
RBC: 4.46 MIL/uL (ref 4.22–5.81)
RDW: 12.7 % (ref 11.5–15.5)
WBC: 12 10*3/uL — ABNORMAL HIGH (ref 4.0–10.5)
nRBC: 0 % (ref 0.0–0.2)

## 2020-09-24 LAB — BASIC METABOLIC PANEL
Anion gap: 10 (ref 5–15)
BUN: 16 mg/dL (ref 8–23)
CO2: 22 mmol/L (ref 22–32)
Calcium: 9.2 mg/dL (ref 8.9–10.3)
Chloride: 104 mmol/L (ref 98–111)
Creatinine, Ser: 0.8 mg/dL (ref 0.61–1.24)
GFR, Estimated: >60 mL/min (ref >60)
Glucose, Bld: 123 mg/dL — ABNORMAL HIGH (ref 70–99)
Potassium: 4.2 mmol/L (ref 3.5–5.1)
Sodium: 136 mmol/L (ref 135–145)

## 2020-09-24 LAB — TYPE AND SCREEN
ABO/RH(D): AB POS
Antibody Screen: NEGATIVE

## 2020-09-24 LAB — SURGICAL PCR SCREEN
MRSA, PCR: NEGATIVE
Staphylococcus aureus: NEGATIVE

## 2020-09-24 LAB — GLUCOSE, CAPILLARY: Glucose-Capillary: 130 mg/dL — ABNORMAL HIGH (ref 70–99)

## 2020-09-24 NOTE — Progress Notes (Signed)
PCP - Crosby Oyster, PA-C Cardiologist - Swaziland, peter, MD  PPM/ICD - ICD Device Orders - yes Rep Notified - yes  Chest x-ray - 07/31/2020 EKG - 09/06/2020 Stress Test - 08/21/2020 ECHO - 08/30/2020 Cardiac Cath - 09/06/2020  Sleep Study - yes CPAP - no  Fasting Blood Sugar - patient verbalized that he doesn't have diabetes. He is not checking CBG CBG today - 130  Blood Thinner Instructions: n/a  Aspirin Instructions: Aspirin last dose 09/23/2020 per patient  Patient was instructed: As of today, STOP taking any Aspirin (unless otherwise instructed by your surgeon) Aleve, Naproxen, Ibuprofen, Motrin, Advil, Goody's, BC's, all herbal medications, fish oil, and all vitamins  ERAS Protcol - no  COVID TEST- the test was scheduled for 10/01/2020 at 13:45 o'clock. Patient verbalized understanding.   Anesthesia review: yes - cardiac history; ICD  Patient denies shortness of breath, fever, cough and chest pain at PAT appointment   All instructions explained to the patient, with a verbal understanding of the material. Patient agrees to go over the instructions while at home for a better understanding. Patient also instructed to self quarantine after being tested for COVID-19. The opportunity to ask questions was provided.

## 2020-09-25 ENCOUNTER — Inpatient Hospital Stay (HOSPITAL_COMMUNITY): Admission: RE | Admit: 2020-09-25 | Payer: PPO | Source: Ambulatory Visit

## 2020-09-25 NOTE — Anesthesia Preprocedure Evaluation (Addendum)
Anesthesia Evaluation  Patient identified by MRN, date of birth, ID band Patient awake    Reviewed: Allergy & Precautions, NPO status , Patient's Chart, lab work & pertinent test results  Airway Mallampati: II  TM Distance: >3 FB Neck ROM: Full    Dental  (+) Teeth Intact, Dental Advisory Given   Pulmonary former smoker,    breath sounds clear to auscultation       Cardiovascular hypertension, + CAD, + Cardiac Stents, + CABG and +CHF  + dysrhythmias + Cardiac Defibrillator  Rhythm:Regular Rate:Normal     Neuro/Psych PSYCHIATRIC DISORDERS Anxiety negative neurological ROS     GI/Hepatic Neg liver ROS, GERD  ,  Endo/Other  negative endocrine ROS  Renal/GU negative Renal ROS     Musculoskeletal  (+) Arthritis ,   Abdominal Normal abdominal exam  (+)   Peds  Hematology negative hematology ROS (+)   Anesthesia Other Findings   Reproductive/Obstetrics                           Anesthesia Physical Anesthesia Plan  ASA: 3  Anesthesia Plan: General   Post-op Pain Management:    Induction: Intravenous  PONV Risk Score and Plan: 3 and Ondansetron, Midazolam and Dexamethasone  Airway Management Planned: Oral ETT  Additional Equipment: None  Intra-op Plan:   Post-operative Plan: Extubation in OR  Informed Consent: I have reviewed the patients History and Physical, chart, labs and discussed the procedure including the risks, benefits and alternatives for the proposed anesthesia with the patient or authorized representative who has indicated his/her understanding and acceptance.     Dental advisory given  Plan Discussed with: CRNA  Anesthesia Plan Comments: (PAT note written 09/25/2020 by Shonna Chock, PA-C.  AICD reprogrammed due to patient positioning and patients request for nothing hard to be placed on chest due to nerve injury. Defibrillator pads were applied.  )       Anesthesia Quick Evaluation

## 2020-09-25 NOTE — Progress Notes (Signed)
Anesthesia Chart Review:  Case: [id number] Date/Time: 10/03/20 1215   Procedures:      PLIF L45;EXPLORE FUSION,EXTEND HARDWAR L4-S1,POSS ILIAC FIXATION     APPLICATION OF ROBOTIC ASSISTANCE FOR SPINAL PROCEDURE   Anesthesia type: General   Pre-op diagnosis: DEGENERATIVE DISC DISEASE, LUMBAR   [facility] RO[facility] / [facility] OR   Surgeons: Lisbeth Renshaw, MD       DISCUSSION: Patient is a 65 year old male scheduled for the above procedure.  History includes former smoker (quit 05/07/99), HTN, hypercholesterolemia, CAD (MI s/p multiple RCA stents 2009; NSTEMI, s/p CABG x4 06/14/17: LIMA-LAD, SVG-DIAG, SVG-RI, SVG-RCA), chronic LBBB, chronic systolic CHF, syncope/WCT (with LVEF < 35% and unable to tolerate HF medications doe to orthostasis, s/p Medtronic BiV AICD 03/11/18; LVEF 40% 08/2020), dyspnea, GERD, chronic back pain, spinal surgery (s/p C3-6 ACDF 03/03/11, PLF L5-S1 12/26/11), urethral stricture (s/p suprapubic tube 06/14/17 after unable to place foley for CABG; s/p Ventral onlay Urethroplasty with autologous buccal mucosa graft 10/15/17). + Home COVID test 07/02/20.   Seen in ED 07/31/20 for  episodes of chest pain, SOB, and feeling lightheaded worse over last 2 days. Troponins flat at 19-->15 with known prior readings in the 20's. BNP 224, up from 177 but did not appear significantly volume overloaded. CT showed no PE, mild interval progression of T12 compression fracture. He had EP follow-up 08/14/20 and Imdur added. Lexiscan Myoview on 08/21/20 was high risk due to LVEF 17%, old large inferolateral scare with mild anteroapical ischemia, consider balanced ischemia. This lead to a cardiac cath on 09/06/20 that showed 3V CAD with patent grafts to LAD, D1, PDA. The SVG-RI was occluded but the lesion was moderate (40-65%) which was felt to likely be the reason for graft closure. EF by echo was 40% which was similar to previous echo. Continued medical therapy for CAD reccommended. May consider future sleep study  or PFTs. .   Preoperative cardiology input outlined on 09/12/20 by Micah Flesher, PA, "[name] was last seen on 09/06/20 by Dr. Swaziland.  On that day, he had a right and left heart cath that showed 3/4 patent grafts, no intervention.    Per Dr. Swaziland: He can proceed with surgery. May hold ASA for 5 days.   Therefore, based on ACC/AHA guidelines, the patient would be at acceptable risk for the planned procedure without further cardiovascular testing."  Perioperative IC Rx: Device Information: Clinic EP Physician:  Hillis Range, MD  Device Type:  Defibrillator Manufacturer and Phone #:  Medtronic: [phone] Pacemaker Dependent?:  No. Date of Last Device Check:  08/14/20           Normal Device Function?:  Yes.     Electrophysiologist's Recommendations: Have magnet available. Provide continuous ECG monitoring when magnet is used or reprogramming is to be performed.  Procedure may interfere with device function.  Magnet should be placed over device during procedure.  Preoperative COVID-19 test is scheduled for 10/01/20.  Anesthesia team to evaluate on the day of surgery.    VS: BP (!) 154/85   Pulse 80   Temp 36.7 C   Resp 18   Ht 6\' 1"  (1.854 m)   Wt 102.1 kg   SpO2 97%   BMI 29.69 kg/m    PROVIDERS: Tysinger, , PA-C is PCP  Kermit Balo, Peter, MD is cardiologist. See DISCUSSION. Swaziland, MD is EP cardiologist. Last visit 08/14/20 with 10/14/20, PA-C. Normal ICD function. As needed Lasix added for mild volume overload.  Imdur added for chest pain and Myoview ordered.    LABS: T&S done 09/24/20. Last lab results include: Lab Results  Component Value Date   WBC 12.0 (H) 09/24/2020   HGB 14.8 09/24/2020   HCT 44.8 09/24/2020   PLT 223 09/24/2020   GLUCOSE 123 (H) 09/24/2020   CHOL 153 08/30/2020   TRIG 168 (H) 08/30/2020   HDL 54 08/30/2020   LDLCALC 71 08/30/2020   ALT 21 08/30/2020   AST 22 08/30/2020   NA 136 09/24/2020   K 4.2 09/24/2020   CL  104 09/24/2020   CREATININE 0.80 09/24/2020   BUN 16 09/24/2020   CO2 22 09/24/2020   TSH 1.830 09/03/2020   INR 1.0 08/30/2020   HGBA1C 6.1 (H) 09/03/2020     IMAGES: CTA Chest/CT Abd/pelvis 07/31/20: IMPRESSION: 1. No CT evidence of pulmonary embolism or aortic dissection. 2. Small somewhat loculated appearing left pleural effusion and left lung base probable rounded atelectasis, both decreased in size since the prior CT. 3. Minimal interval progression of T12 compression fracture since the lumbar spine CT of 04/11/2020. 4. Sigmoid diverticulosis. No bowel obstruction. Normal appendix. 5. Aortic Atherosclerosis (ICD10-I70.0) and Emphysema (ICD10-J43.9).  CXR 07/31/20: FINDINGS: The patient is status post CABG with an AICD in place. Small left pleural effusion and basilar atelectasis are unchanged. The right lung is clear. No pneumothorax. Heart size is normal. IMPRESSION: No change in a small left pleural effusion and mild basilar atelectasis since 2020.   CT L-spine 04/11/20: IMPRESSION: 1. Acute or subacute T12 compression fracture with 50% height loss and visible/unhealed fracture lines. Heterogeneous sclerosis throughout much of the vertebral body may indicate a subacute fracture with partial healing, however an underlying bone lesion and pathologic fracture are not excluded. No extraosseous soft tissue mass. 2. Mild L3 compression fracture, new from 2013 but potentially chronic. 3. New large left paracentral disc extrusion at L2-3 resulting in moderate left lateral recess stenosis and potential left L3 nerve root impingement. 4. Interval L5-S1 posterior fusion with fracture of both S1 screws. Moderate residual bilateral neural foraminal stenosis. 5. Mild lateral recess and mild-to-moderate neural foraminal stenosis at L4-5. 6. Mild spinal stenosis and mild bilateral neural foraminal stenosis at L3-4. 7. Aortic Atherosclerosis (ICD10-I70.0).    EKG:  09/06/20: Atrial-sensed ventricular-paced rhythm Abnormal ECG No significant change since last tracing Confirmed by Nicki Guadalajara ([id number]) on 09/06/2020 7:29:33 PM   CV: Cardiac cath 09/06/20:   Suezanne Jacquet LM to Mid LM lesion is 40% stenosed.   Prox LAD to Mid LAD lesion is 100% stenosed.   Ramus lesion is 60% stenosed.   Prox RCA lesion is 90% stenosed.   Origin lesion is 100% stenosed.   SVG graft was visualized by angiography and is normal in caliber.   SVG graft was visualized by angiography.   SVG graft was visualized by angiography and is normal in caliber.   LIMA graft was visualized by angiography and is normal in caliber.   The graft exhibits no disease.   The graft exhibits no disease.   The graft exhibits no disease.   LV end diastolic pressure is normal.   There is no aortic valve stenosis.   3 vessel CAD.  Patent LIMA to the LAD.  Patent SVG to the first diagonal Patent SVG to the PDA Occluded SVG to the Ramus intermediate Normal LV filling pressures Normal right heart pressures Normal cardiac output   Plan: recommend continued medical management. The lesion in the ramus intermediate is moderate -  in some views appears <40% and other views about 60-65%. This is certainly not critical. Good flow down the native vessel is probably the reason for graft closure. His EF by Echo is in line with prior Echo in 2020. Based on current findings I cannot explain his dyspnea. Would consider outpatient sleep study and/or PFTs to consider other causes. We will add Jardiance for his CHF and DM. He does not need diuresis.      Echo 08/30/20: IMPRESSIONS   1. Poor acoustic windows limit study. Definity used. LVEF is moderately  depressed with diffuse hypokinesis worse in the annteiror and lateral  walls. LVEF is approximately 40%. The left ventricle has moderately  decreased function. The left ventricular  internal cavity size was severely dilated. There is mild left ventricular  hypertrophy.  Indeterminate diastolic filling due to E-A fusion.   2. Right ventricular systolic function is normal. The right ventricular  size is normal.   3. Mild mitral valve regurgitation.   4. Aortic valve regurgitation is mild. Mild aortic valve sclerosis is  present, with no evidence of aortic valve stenosis.   5. Aortic dilatation noted. There is mild dilatation of the aortic root,  measuring 42 mm.  - Comparison(s): The left ventricular function is unchanged.  (07/11/18 LVEF 40-45%; 01/21/18 LVEF 25%; 09/14/17 LVEF 25-30%; 11/20/14 LVEF 50-55%)   Nuclear stress test 08/21/20: Nuclear stress EF: 17%. The left ventricular ejection fraction is severely decreased (<30%). There was no ST segment deviation noted during stress. Defect 1: There is a large defect of severe severity present in the basal inferior, basal inferolateral, mid inferior, mid inferolateral and apical inferior location. Defect 2: There is a small defect of mild severity present in the apical anterior location. This is a high risk study. Findings consistent with ischemia and prior myocardial infarction.   High risk study due to severely decreased left ventricular function. There is a large (old) inferolateral scar. There is a small area of mild anteroapical ischemia. There has been a marked reduction in left ventricular systolic function since 2018.  Recommend correlation with echo and consider possible "balanced ischemia". Compared to ECG from 02/05/2020, there is a marked reduction in the amplitude of the initial R wave in V1, although the initial deflection is still positive. Visually, there appears to be substantial septal-lateral dyssynchrony. Consider reevaluation of CRT device settings.    US Carotid 06/11/17: Final Interpretation:  Right Carotid: Velocities in the right ICA are consistent with a 40-59%                 stenosis. The ECA appears >50% stenosed.  Left Carotid: Velocities in the left ICA are consistent with a  40-59%  stenosis.    Past Medical History:  Diagnosis Date   AICD (automatic cardioverter/defibrillator) present    Anxiety    Arthritis    "mild; back, neck, spine" (06/09/2017)   CHF (congestive heart failure) (HCC)    Chronic back pain    Coronary artery disease    Dyspnea    GERD (gastroesophageal reflux disease)    History of gout    History of kidney stones X 2   Hypercholesterolemia    Hypertension    LBBB (left bundle branch block)    MI (myocardial infarction) (HCC) 1993   LATERAL   MI (myocardial infarction) (HCC) ?09/2001; 06/07/2017   Pneumonia    "several times" (06/09/2017)   S/P coronary artery stent placement    RCA   Varicose veins  Past Surgical History:  Procedure Laterality Date   ANTERIOR CERVICAL DECOMP/DISCECTOMY FUSION  03/03/2011   Procedure: ANTERIOR CERVICAL DECOMPRESSION/DISCECTOMY FUSION 3 LEVELS;  Surgeon: Karn Cassis, MD;  Location: [facility] NEURO ORS;  Service: Neurosurgery;  Laterality: N/A;  Cervical three-four Cervical four-five Cervical five-six Anterior cervical decompression/diskectomy, fusion   BACK SURGERY     CARDIAC CATHETERIZATION  2009   Stents in RCA patent. 70 to 80% PL, and 50% ostial PD.    CARDIAC CATHETERIZATION N/A 11/20/2014   Procedure: Left Heart Cath and Coronary Angiography;  Surgeon: Peter M Swaziland, MD;  Location: [facility] INVASIVE CV LAB;  Service: Cardiovascular;  Laterality: N/A;   CORONARY ANGIOPLASTY     DIRECT ANGIOPLASTY THE MARGINAL BRANCH   CORONARY ANGIOPLASTY WITH STENT PLACEMENT     RCA   CORONARY ARTERY BYPASS GRAFT N/A 06/14/2017   Procedure: CORONARY ARTERY BYPASS GRAFTING (CABG) x four, using left internal mammary artery and right leg greater saphenous vein harvested endoscopically;  Surgeon: Kerin Perna, MD;  Location: [facility] OR;  Service: Open Heart Surgery;  Laterality: N/A;   CYSTOSCOPY N/A 06/14/2017   Procedure: CYSTOSCOPY;  Surgeon: Kerin Perna, MD;  Location: [facility] OR;  Service: Open Heart Surgery;   Laterality: N/A;   ICD IMPLANT N/A 03/11/2018   Procedure: ICD IMPLANT - Dual Chamber;  Surgeon: [name], MD;  [facility] IN[facility] CV LAB;  Service: Cardiovascular;  Laterality: N/A;   INSERTION OF SUPRAPUBIC CATHETER N/A 06/14/2017   Procedure: INSERTION OF SUPRAPUBIC CATHETER - Lower abdomen;  Surgeon: Kerin Perna, MD;  Location: [facility] OR;  Service: Open Heart Surgery;  Laterality: N/A;   POSTERIOR LUMBAR FUSION  10/2011   RIGHT/LEFT HEART CATH AND CORONARY ANGIOGRAPHY N/A 06/08/2017   Procedure: RIGHT/LEFT HEART CATH AND CORONARY ANGIOGRAPHY;  Surgeon: Swaziland, Peter M, MD;  Location: [facility] INVASIVE CV LAB;  Service: Cardiovascular;  Laterality: N/A;   RIGHT/LEFT HEART CATH AND CORONARY/GRAFT ANGIOGRAPHY N/A 09/06/2020   Procedure: RIGHT/LEFT HEART CATH AND CORONARY/GRAFT ANGIOGRAPHY;  Surgeon: Swaziland, Peter M, MD;  Location: [facility] INVASIVE CV LAB;  Service: Cardiovascular;  Laterality: N/A;   TEE WITHOUT CARDIOVERSION N/A 06/14/2017   Procedure: TRANSESOPHAGEAL ECHOCARDIOGRAM (TEE);  Surgeon: Donata Clay, Theron Arista, MD;  Location: [facility] OR;  Service: Open Heart Surgery;  Laterality: N/A;   URETHROPLASTY N/A 10/15/2017   Procedure: URETHROPLASTY WITH BUCCAL GRAFT HARVAST;  Surgeon: Crist Fat, MD;  Location: [facility] ORS;  Service: Urology;  Laterality: N/A;    MEDICATIONS:  acetaminophen (TYLENOL) 500 MG tablet   aspirin 81 MG EC tablet   carvedilol (COREG) 6.25 MG tablet   empagliflozin (JARDIANCE) 10 MG TABS tablet   ENTRESTO 24-26 MG   esomeprazole (NEXIUM) 20 MG capsule   furosemide (LASIX) 20 MG tablet   Glucosamine-Chondroitin (OSTEO BI-FLEX REGULAR STRENGTH PO)   Multiple Vitamin (MULTIVITAMIN WITH MINERALS) TABS tablet   nitroGLYCERIN (NITROSTAT) 0.4 MG SL tablet   OVER THE COUNTER MEDICATION   REPATHA SURECLICK 140 MG/ML SOAJ   zolpidem (AMBIEN) 10 MG tablet   No current facility-administered medications for this encounter.    Shonna Chock, PA-C Surgical Short  Stay/Anesthesiology [facility] Phone [phone] [facility] Phone [phone] 09/25/2020 4:57 PM

## 2020-10-01 ENCOUNTER — Other Ambulatory Visit (HOSPITAL_COMMUNITY)
Admission: RE | Admit: 2020-10-01 | Discharge: 2020-10-01 | Disposition: A | Discharge status: Home / Self Care | Payer: PPO | Source: Ambulatory Visit | Attending: Neurosurgery | Admitting: Neurosurgery

## 2020-10-01 ENCOUNTER — Other Ambulatory Visit: Payer: Self-pay | Admitting: Medical

## 2020-10-01 ENCOUNTER — Other Ambulatory Visit: Payer: Self-pay

## 2020-10-01 DIAGNOSIS — I447 Left bundle-branch block, unspecified: Secondary | ICD-10-CM | POA: Diagnosis present

## 2020-10-01 DIAGNOSIS — Z01812 Encounter for preprocedural laboratory examination: Secondary | ICD-10-CM | POA: Insufficient documentation

## 2020-10-01 DIAGNOSIS — M5126 Other intervertebral disc displacement, lumbar region: Secondary | ICD-10-CM | POA: Diagnosis not present

## 2020-10-01 DIAGNOSIS — Z87891 Personal history of nicotine dependence: Secondary | ICD-10-CM | POA: Diagnosis not present

## 2020-10-01 DIAGNOSIS — M960 Pseudarthrosis after fusion or arthrodesis: Secondary | ICD-10-CM | POA: Diagnosis present

## 2020-10-01 DIAGNOSIS — R066 Hiccough: Secondary | ICD-10-CM | POA: Diagnosis not present

## 2020-10-01 DIAGNOSIS — M48061 Spinal stenosis, lumbar region without neurogenic claudication: Secondary | ICD-10-CM | POA: Diagnosis present

## 2020-10-01 DIAGNOSIS — I252 Old myocardial infarction: Secondary | ICD-10-CM | POA: Diagnosis not present

## 2020-10-01 DIAGNOSIS — Z955 Presence of coronary angioplasty implant and graft: Secondary | ICD-10-CM | POA: Diagnosis not present

## 2020-10-01 DIAGNOSIS — S32009A Unspecified fracture of unspecified lumbar vertebra, initial encounter for closed fracture: Secondary | ICD-10-CM | POA: Diagnosis not present

## 2020-10-01 DIAGNOSIS — M109 Gout, unspecified: Secondary | ICD-10-CM | POA: Diagnosis present

## 2020-10-01 DIAGNOSIS — Z7982 Long term (current) use of aspirin: Secondary | ICD-10-CM | POA: Diagnosis not present

## 2020-10-01 DIAGNOSIS — Z951 Presence of aortocoronary bypass graft: Secondary | ICD-10-CM | POA: Diagnosis not present

## 2020-10-01 DIAGNOSIS — Y831 Surgical operation with implant of artificial internal device as the cause of abnormal reaction of the patient, or of later complication, without mention of misadventure at the time of the procedure: Secondary | ICD-10-CM | POA: Diagnosis present

## 2020-10-01 DIAGNOSIS — I110 Hypertensive heart disease with heart failure: Secondary | ICD-10-CM | POA: Diagnosis present

## 2020-10-01 DIAGNOSIS — Z20822 Contact with and (suspected) exposure to covid-19: Secondary | ICD-10-CM | POA: Insufficient documentation

## 2020-10-01 DIAGNOSIS — Z888 Allergy status to other drugs, medicaments and biological substances status: Secondary | ICD-10-CM | POA: Diagnosis not present

## 2020-10-01 DIAGNOSIS — Z01818 Encounter for other preprocedural examination: Secondary | ICD-10-CM | POA: Diagnosis not present

## 2020-10-01 DIAGNOSIS — T84296A Other mechanical complication of internal fixation device of vertebrae, initial encounter: Secondary | ICD-10-CM | POA: Diagnosis not present

## 2020-10-01 DIAGNOSIS — M5116 Intervertebral disc disorders with radiculopathy, lumbar region: Secondary | ICD-10-CM | POA: Diagnosis present

## 2020-10-01 DIAGNOSIS — M4326 Fusion of spine, lumbar region: Secondary | ICD-10-CM | POA: Diagnosis not present

## 2020-10-01 DIAGNOSIS — Z79899 Other long term (current) drug therapy: Secondary | ICD-10-CM | POA: Diagnosis not present

## 2020-10-01 DIAGNOSIS — I2510 Atherosclerotic heart disease of native coronary artery without angina pectoris: Secondary | ICD-10-CM | POA: Diagnosis present

## 2020-10-01 DIAGNOSIS — Z981 Arthrodesis status: Secondary | ICD-10-CM | POA: Diagnosis not present

## 2020-10-01 DIAGNOSIS — G9741 Accidental puncture or laceration of dura during a procedure: Secondary | ICD-10-CM | POA: Diagnosis present

## 2020-10-01 DIAGNOSIS — I509 Heart failure, unspecified: Secondary | ICD-10-CM | POA: Diagnosis present

## 2020-10-01 DIAGNOSIS — K219 Gastro-esophageal reflux disease without esophagitis: Secondary | ICD-10-CM | POA: Diagnosis present

## 2020-10-01 DIAGNOSIS — Z95810 Presence of automatic (implantable) cardiac defibrillator: Secondary | ICD-10-CM | POA: Diagnosis not present

## 2020-10-01 DIAGNOSIS — T84216A Breakdown (mechanical) of internal fixation device of vertebrae, initial encounter: Secondary | ICD-10-CM | POA: Diagnosis not present

## 2020-10-01 DIAGNOSIS — E7800 Pure hypercholesterolemia, unspecified: Secondary | ICD-10-CM | POA: Diagnosis present

## 2020-10-01 DIAGNOSIS — G8929 Other chronic pain: Secondary | ICD-10-CM | POA: Diagnosis present

## 2020-10-01 LAB — SARS CORONAVIRUS 2 (TAT 6-24 HRS): SARS Coronavirus 2: NEGATIVE

## 2020-10-02 ENCOUNTER — Other Ambulatory Visit (HOSPITAL_COMMUNITY): Payer: Self-pay | Admitting: Neurosurgery

## 2020-10-02 DIAGNOSIS — M5136 Other intervertebral disc degeneration, lumbar region: Secondary | ICD-10-CM

## 2020-10-03 ENCOUNTER — Inpatient Hospital Stay (HOSPITAL_COMMUNITY): Payer: PPO | Admitting: Anesthesiology

## 2020-10-03 ENCOUNTER — Inpatient Hospital Stay (HOSPITAL_COMMUNITY)
Admission: RE | Admit: 2020-10-03 | Discharge: 2020-10-03 | Disposition: A | Discharge status: Home / Self Care | Payer: PPO | Source: Ambulatory Visit | Attending: Neurosurgery | Admitting: Neurosurgery

## 2020-10-03 ENCOUNTER — Inpatient Hospital Stay (HOSPITAL_COMMUNITY): Payer: PPO | Admitting: Vascular Surgery

## 2020-10-03 ENCOUNTER — Other Ambulatory Visit: Payer: Self-pay

## 2020-10-03 ENCOUNTER — Inpatient Hospital Stay (HOSPITAL_COMMUNITY)
Admission: RE | Admit: 2020-10-03 | Discharge: 2020-10-06 | DRG: 460 | Disposition: A | Discharge status: Home / Self Care | Payer: PPO | Source: Ambulatory Visit | Attending: Neurosurgery | Admitting: Neurosurgery

## 2020-10-03 ENCOUNTER — Encounter (HOSPITAL_COMMUNITY)
Admission: RE | Disposition: A | Discharge status: Home / Self Care | Payer: Self-pay | Source: Ambulatory Visit | Attending: Neurosurgery

## 2020-10-03 ENCOUNTER — Inpatient Hospital Stay (HOSPITAL_COMMUNITY): Payer: PPO

## 2020-10-03 ENCOUNTER — Encounter (HOSPITAL_COMMUNITY): Payer: Self-pay | Admitting: Neurosurgery

## 2020-10-03 DIAGNOSIS — M545 Low back pain, unspecified: Secondary | ICD-10-CM | POA: Diagnosis present

## 2020-10-03 DIAGNOSIS — S32009K Unspecified fracture of unspecified lumbar vertebra, subsequent encounter for fracture with nonunion: Secondary | ICD-10-CM | POA: Diagnosis present

## 2020-10-03 DIAGNOSIS — Z87891 Personal history of nicotine dependence: Secondary | ICD-10-CM | POA: Diagnosis not present

## 2020-10-03 DIAGNOSIS — Z7982 Long term (current) use of aspirin: Secondary | ICD-10-CM

## 2020-10-03 DIAGNOSIS — M5116 Intervertebral disc disorders with radiculopathy, lumbar region: Secondary | ICD-10-CM | POA: Diagnosis present

## 2020-10-03 DIAGNOSIS — Z419 Encounter for procedure for purposes other than remedying health state, unspecified: Secondary | ICD-10-CM

## 2020-10-03 DIAGNOSIS — M4326 Fusion of spine, lumbar region: Secondary | ICD-10-CM | POA: Diagnosis not present

## 2020-10-03 DIAGNOSIS — M48061 Spinal stenosis, lumbar region without neurogenic claudication: Secondary | ICD-10-CM | POA: Diagnosis present

## 2020-10-03 DIAGNOSIS — G9741 Accidental puncture or laceration of dura during a procedure: Secondary | ICD-10-CM | POA: Diagnosis present

## 2020-10-03 DIAGNOSIS — Y831 Surgical operation with implant of artificial internal device as the cause of abnormal reaction of the patient, or of later complication, without mention of misadventure at the time of the procedure: Secondary | ICD-10-CM | POA: Diagnosis present

## 2020-10-03 DIAGNOSIS — M5126 Other intervertebral disc displacement, lumbar region: Secondary | ICD-10-CM | POA: Diagnosis not present

## 2020-10-03 DIAGNOSIS — E7800 Pure hypercholesterolemia, unspecified: Secondary | ICD-10-CM | POA: Diagnosis present

## 2020-10-03 DIAGNOSIS — I252 Old myocardial infarction: Secondary | ICD-10-CM

## 2020-10-03 DIAGNOSIS — I2510 Atherosclerotic heart disease of native coronary artery without angina pectoris: Secondary | ICD-10-CM | POA: Diagnosis present

## 2020-10-03 DIAGNOSIS — Z981 Arthrodesis status: Secondary | ICD-10-CM | POA: Diagnosis not present

## 2020-10-03 DIAGNOSIS — Z951 Presence of aortocoronary bypass graft: Secondary | ICD-10-CM

## 2020-10-03 DIAGNOSIS — M960 Pseudarthrosis after fusion or arthrodesis: Principal | ICD-10-CM | POA: Diagnosis present

## 2020-10-03 DIAGNOSIS — K219 Gastro-esophageal reflux disease without esophagitis: Secondary | ICD-10-CM | POA: Diagnosis present

## 2020-10-03 DIAGNOSIS — I110 Hypertensive heart disease with heart failure: Secondary | ICD-10-CM | POA: Diagnosis present

## 2020-10-03 DIAGNOSIS — Z955 Presence of coronary angioplasty implant and graft: Secondary | ICD-10-CM

## 2020-10-03 DIAGNOSIS — Z20822 Contact with and (suspected) exposure to covid-19: Secondary | ICD-10-CM | POA: Diagnosis present

## 2020-10-03 DIAGNOSIS — M109 Gout, unspecified: Secondary | ICD-10-CM | POA: Diagnosis present

## 2020-10-03 DIAGNOSIS — I447 Left bundle-branch block, unspecified: Secondary | ICD-10-CM | POA: Diagnosis present

## 2020-10-03 DIAGNOSIS — S32009A Unspecified fracture of unspecified lumbar vertebra, initial encounter for closed fracture: Secondary | ICD-10-CM | POA: Diagnosis not present

## 2020-10-03 DIAGNOSIS — Z888 Allergy status to other drugs, medicaments and biological substances status: Secondary | ICD-10-CM

## 2020-10-03 DIAGNOSIS — R066 Hiccough: Secondary | ICD-10-CM | POA: Diagnosis not present

## 2020-10-03 DIAGNOSIS — Z95810 Presence of automatic (implantable) cardiac defibrillator: Secondary | ICD-10-CM

## 2020-10-03 DIAGNOSIS — G8929 Other chronic pain: Secondary | ICD-10-CM | POA: Diagnosis present

## 2020-10-03 DIAGNOSIS — Z01818 Encounter for other preprocedural examination: Secondary | ICD-10-CM | POA: Diagnosis not present

## 2020-10-03 DIAGNOSIS — I509 Heart failure, unspecified: Secondary | ICD-10-CM | POA: Diagnosis present

## 2020-10-03 DIAGNOSIS — Z79899 Other long term (current) drug therapy: Secondary | ICD-10-CM

## 2020-10-03 DIAGNOSIS — M5136 Other intervertebral disc degeneration, lumbar region: Secondary | ICD-10-CM

## 2020-10-03 HISTORY — PX: APPLICATION OF ROBOTIC ASSISTANCE FOR SPINAL PROCEDURE: SHX6753

## 2020-10-03 LAB — POCT I-STAT, CHEM 8
BUN: 21 mg/dL (ref 8–23)
Calcium, Ion: 1.21 mmol/L (ref 1.15–1.40)
Chloride: 103 mmol/L (ref 98–111)
Creatinine, Ser: 0.7 mg/dL (ref 0.61–1.24)
Glucose, Bld: 140 mg/dL — ABNORMAL HIGH (ref 70–99)
HCT: 40 % (ref 39.0–52.0)
Hemoglobin: 13.6 g/dL (ref 13.0–17.0)
Potassium: 5.1 mmol/L (ref 3.5–5.1)
Sodium: 138 mmol/L (ref 135–145)
TCO2: 25 mmol/L (ref 22–32)

## 2020-10-03 LAB — CBC
HCT: 39.4 % (ref 39.0–52.0)
Hemoglobin: 12.7 g/dL — ABNORMAL LOW (ref 13.0–17.0)
MCH: 32.9 pg (ref 26.0–34.0)
MCHC: 32.2 g/dL (ref 30.0–36.0)
MCV: 102.1 fL — ABNORMAL HIGH (ref 80.0–100.0)
Platelets: 209 10*3/uL (ref 150–400)
RBC: 3.86 MIL/uL — ABNORMAL LOW (ref 4.22–5.81)
RDW: 13.4 % (ref 11.5–15.5)
WBC: 13.5 10*3/uL — ABNORMAL HIGH (ref 4.0–10.5)
nRBC: 0 % (ref 0.0–0.2)

## 2020-10-03 LAB — GLUCOSE, CAPILLARY
Glucose-Capillary: 124 mg/dL — ABNORMAL HIGH (ref 70–99)
Glucose-Capillary: 99 mg/dL (ref 70–99)

## 2020-10-03 LAB — CREATININE, SERUM
Creatinine, Ser: 1.02 mg/dL (ref 0.61–1.24)
GFR, Estimated: >60 mL/min (ref >60)

## 2020-10-03 SURGERY — POSTERIOR LUMBAR FUSION 1 LEVEL
Anesthesia: General

## 2020-10-03 MED ORDER — HYDROMORPHONE HCL 1 MG/ML IJ SOLN: 0.5000 mg | INTRAMUSCULAR | Status: DC | PRN

## 2020-10-03 MED ORDER — BUPIVACAINE HCL (PF) 0.5 % IJ SOLN
INTRAMUSCULAR | Status: DC | PRN
  Administered 2020-10-03: 2 mL

## 2020-10-03 MED ORDER — HYDROMORPHONE HCL 1 MG/ML IJ SOLN
INTRAMUSCULAR | Status: AC
  Filled 2020-10-03: qty 1

## 2020-10-03 MED ORDER — SODIUM CHLORIDE 0.9 % IV SOLN
250.0000 mL | INTRAVENOUS | Status: DC
  Administered 2020-10-03: 250 mL via INTRAVENOUS

## 2020-10-03 MED ORDER — HYDROMORPHONE HCL 1 MG/ML IJ SOLN
INTRAMUSCULAR | Status: DC | PRN
  Administered 2020-10-03: .5 mg via INTRAVENOUS

## 2020-10-03 MED ORDER — CARVEDILOL 3.125 MG PO TABS
3.1250 mg | ORAL_TABLET | Freq: Two times a day (BID) | ORAL | Status: DC
  Administered 2020-10-04 – 2020-10-06 (×5): 3.125 mg via ORAL
  Filled 2020-10-03 (×5): qty 1

## 2020-10-03 MED ORDER — PROPOFOL 10 MG/ML IV BOLUS
INTRAVENOUS | Status: AC
  Filled 2020-10-03: qty 20

## 2020-10-03 MED ORDER — 0.9 % SODIUM CHLORIDE (POUR BTL) OPTIME
TOPICAL | Status: DC | PRN
  Administered 2020-10-03: 1000 mL

## 2020-10-03 MED ORDER — NITROGLYCERIN 0.4 MG SL SUBL: 0.4000 mg | SUBLINGUAL_TABLET | SUBLINGUAL | Status: DC | PRN

## 2020-10-03 MED ORDER — PHENYLEPHRINE 40 MCG/ML (10ML) SYRINGE FOR IV PUSH (FOR BLOOD PRESSURE SUPPORT)
PREFILLED_SYRINGE | INTRAVENOUS | Status: AC
  Filled 2020-10-03: qty 20

## 2020-10-03 MED ORDER — ONDANSETRON HCL 4 MG/2ML IJ SOLN: 4.0000 mg | Freq: Four times a day (QID) | INTRAMUSCULAR | Status: DC | PRN

## 2020-10-03 MED ORDER — PANTOPRAZOLE SODIUM 40 MG PO TBEC
40.0000 mg | DELAYED_RELEASE_TABLET | Freq: Every day | ORAL | Status: DC
  Administered 2020-10-04 – 2020-10-06 (×3): 40 mg via ORAL
  Filled 2020-10-03 (×3): qty 1

## 2020-10-03 MED ORDER — ACETAMINOPHEN 10 MG/ML IV SOLN
INTRAVENOUS | Status: AC
  Administered 2020-10-03: 1000 mg
  Filled 2020-10-03: qty 100

## 2020-10-03 MED ORDER — ROCURONIUM BROMIDE 10 MG/ML (PF) SYRINGE
PREFILLED_SYRINGE | INTRAVENOUS | Status: AC
  Filled 2020-10-03: qty 10

## 2020-10-03 MED ORDER — MORPHINE SULFATE (PF) 4 MG/ML IV SOLN
4.0000 mg | INTRAVENOUS | Status: DC | PRN
  Administered 2020-10-04 (×4): 4 mg via INTRAVENOUS
  Filled 2020-10-03 (×5): qty 1

## 2020-10-03 MED ORDER — CEFAZOLIN SODIUM-DEXTROSE 2-4 GM/100ML-% IV SOLN
2.0000 g | Freq: Three times a day (TID) | INTRAVENOUS | Status: AC
Start: 2020-10-04 — End: 2020-10-04
  Administered 2020-10-03 – 2020-10-04 (×2): 2 g via INTRAVENOUS
  Filled 2020-10-03 (×2): qty 100

## 2020-10-03 MED ORDER — PHENYLEPHRINE HCL (PRESSORS) 10 MG/ML IV SOLN
INTRAVENOUS | Status: DC | PRN
  Administered 2020-10-03 (×4): 100 ug via INTRAVENOUS

## 2020-10-03 MED ORDER — LACTATED RINGERS IV SOLN: INTRAVENOUS | Status: DC

## 2020-10-03 MED ORDER — MIDAZOLAM HCL 5 MG/5ML IJ SOLN
INTRAMUSCULAR | Status: DC | PRN
  Administered 2020-10-03: 2 mg via INTRAVENOUS

## 2020-10-03 MED ORDER — METHOCARBAMOL 1000 MG/10ML IJ SOLN
500.0000 mg | Freq: Four times a day (QID) | INTRAVENOUS | Status: DC | PRN
  Filled 2020-10-03: qty 5

## 2020-10-03 MED ORDER — CHLORHEXIDINE GLUCONATE 0.12 % MT SOLN
15.0000 mL | Freq: Once | OROMUCOSAL | Status: AC
  Administered 2020-10-03: 15 mL via OROMUCOSAL
  Filled 2020-10-03: qty 15

## 2020-10-03 MED ORDER — ACETAMINOPHEN 10 MG/ML IV SOLN: 1000.0000 mg | Freq: Once | INTRAVENOUS | Status: DC | PRN

## 2020-10-03 MED ORDER — CHLORHEXIDINE GLUCONATE CLOTH 2 % EX PADS: 6.0000 | MEDICATED_PAD | Freq: Once | CUTANEOUS | Status: DC

## 2020-10-03 MED ORDER — SENNA 8.6 MG PO TABS
1.0000 | ORAL_TABLET | Freq: Two times a day (BID) | ORAL | Status: DC
  Administered 2020-10-04 – 2020-10-05 (×5): 8.6 mg via ORAL
  Filled 2020-10-03 (×5): qty 1

## 2020-10-03 MED ORDER — SODIUM CHLORIDE 0.9 % IV SOLN: INTRAVENOUS | Status: DC

## 2020-10-03 MED ORDER — ACETAMINOPHEN 160 MG/5ML PO SOLN: 325.0000 mg | Freq: Once | ORAL | Status: DC | PRN

## 2020-10-03 MED ORDER — HYDROMORPHONE HCL 1 MG/ML IJ SOLN
0.2500 mg | INTRAMUSCULAR | Status: DC | PRN
  Administered 2020-10-03 (×6): 0.5 mg via INTRAVENOUS

## 2020-10-03 MED ORDER — LIDOCAINE-EPINEPHRINE 1 %-1:100000 IJ SOLN
INTRAMUSCULAR | Status: DC | PRN
  Administered 2020-10-03: 2 mL

## 2020-10-03 MED ORDER — LIDOCAINE HCL (PF) 2 % IJ SOLN
INTRAMUSCULAR | Status: AC
  Filled 2020-10-03: qty 10

## 2020-10-03 MED ORDER — ADULT MULTIVITAMIN W/MINERALS CH
1.0000 | ORAL_TABLET | Freq: Every day | ORAL | Status: DC
  Administered 2020-10-04 – 2020-10-06 (×3): 1 via ORAL
  Filled 2020-10-03 (×3): qty 1

## 2020-10-03 MED ORDER — PANTOPRAZOLE SODIUM 40 MG IV SOLR: 40.0000 mg | Freq: Every day | INTRAVENOUS | Status: DC

## 2020-10-03 MED ORDER — HYDROMORPHONE HCL 1 MG/ML IJ SOLN
INTRAMUSCULAR | Status: AC
  Filled 2020-10-03: qty 0.5

## 2020-10-03 MED ORDER — OXYCODONE HCL 5 MG PO TABS
5.0000 mg | ORAL_TABLET | ORAL | Status: DC | PRN
  Administered 2020-10-03 – 2020-10-04 (×2): 5 mg via ORAL
  Filled 2020-10-03 (×2): qty 1

## 2020-10-03 MED ORDER — ACETAMINOPHEN 650 MG RE SUPP: 650.0000 mg | RECTAL | Status: DC | PRN

## 2020-10-03 MED ORDER — FUROSEMIDE 20 MG PO TABS: 20.0000 mg | ORAL_TABLET | ORAL | Status: DC | PRN

## 2020-10-03 MED ORDER — ONDANSETRON HCL 4 MG/2ML IJ SOLN
INTRAMUSCULAR | Status: AC
  Filled 2020-10-03: qty 2

## 2020-10-03 MED ORDER — ONDANSETRON HCL 4 MG/2ML IJ SOLN
INTRAMUSCULAR | Status: DC | PRN
  Administered 2020-10-03: 4 mg via INTRAVENOUS

## 2020-10-03 MED ORDER — GLUCOSAMINE-CHONDROITIN 250-200 MG PO TABS: ORAL_TABLET | Freq: Two times a day (BID) | ORAL | Status: DC

## 2020-10-03 MED ORDER — CEFAZOLIN SODIUM 1 G IJ SOLR
INTRAMUSCULAR | Status: AC
  Filled 2020-10-03: qty 20

## 2020-10-03 MED ORDER — FENTANYL CITRATE (PF) 100 MCG/2ML IJ SOLN
INTRAMUSCULAR | Status: DC | PRN
  Administered 2020-10-03: 100 ug via INTRAVENOUS
  Administered 2020-10-03 (×2): 50 ug via INTRAVENOUS
  Administered 2020-10-03: 100 ug via INTRAVENOUS

## 2020-10-03 MED ORDER — SODIUM CHLORIDE 0.9% FLUSH: 3.0000 mL | INTRAVENOUS | Status: DC | PRN

## 2020-10-03 MED ORDER — SACUBITRIL-VALSARTAN 24-26 MG PO TABS
1.0000 | ORAL_TABLET | Freq: Two times a day (BID) | ORAL | Status: DC
  Administered 2020-10-04 – 2020-10-06 (×4): 1 via ORAL
  Filled 2020-10-03 (×6): qty 1

## 2020-10-03 MED ORDER — PROMETHAZINE HCL 25 MG/ML IJ SOLN: 6.2500 mg | INTRAMUSCULAR | Status: DC | PRN

## 2020-10-03 MED ORDER — ACETAMINOPHEN 325 MG PO TABS: 325.0000 mg | ORAL_TABLET | Freq: Once | ORAL | Status: DC | PRN

## 2020-10-03 MED ORDER — DOCUSATE SODIUM 100 MG PO CAPS
100.0000 mg | ORAL_CAPSULE | Freq: Two times a day (BID) | ORAL | Status: DC
  Administered 2020-10-03 – 2020-10-05 (×5): 100 mg via ORAL
  Filled 2020-10-03 (×5): qty 1

## 2020-10-03 MED ORDER — AMISULPRIDE (ANTIEMETIC) 5 MG/2ML IV SOLN: 10.0000 mg | Freq: Once | INTRAVENOUS | Status: DC | PRN

## 2020-10-03 MED ORDER — SUGAMMADEX SODIUM 500 MG/5ML IV SOLN
INTRAVENOUS | Status: AC
  Filled 2020-10-03: qty 5

## 2020-10-03 MED ORDER — THROMBIN 5000 UNITS EX SOLR
CUTANEOUS | Status: AC
  Filled 2020-10-03: qty 5000

## 2020-10-03 MED ORDER — BUPIVACAINE HCL (PF) 0.5 % IJ SOLN
INTRAMUSCULAR | Status: AC
  Filled 2020-10-03: qty 30

## 2020-10-03 MED ORDER — MENTHOL 3 MG MT LOZG
1.0000 | LOZENGE | OROMUCOSAL | Status: DC | PRN
  Filled 2020-10-03: qty 9

## 2020-10-03 MED ORDER — PHENOL 1.4 % MT LIQD: 1.0000 | OROMUCOSAL | Status: DC | PRN

## 2020-10-03 MED ORDER — PHENYLEPHRINE HCL-NACL 20-0.9 MG/250ML-% IV SOLN
INTRAVENOUS | Status: DC | PRN
  Administered 2020-10-03: 50 ug/min via INTRAVENOUS

## 2020-10-03 MED ORDER — SODIUM CHLORIDE 0.9% FLUSH
3.0000 mL | Freq: Two times a day (BID) | INTRAVENOUS | Status: DC
  Administered 2020-10-03 – 2020-10-05 (×3): 3 mL via INTRAVENOUS

## 2020-10-03 MED ORDER — PROPOFOL 10 MG/ML IV BOLUS
INTRAVENOUS | Status: DC | PRN
  Administered 2020-10-03: 140 mg via INTRAVENOUS

## 2020-10-03 MED ORDER — FENTANYL CITRATE (PF) 250 MCG/5ML IJ SOLN
INTRAMUSCULAR | Status: AC
  Filled 2020-10-03: qty 5

## 2020-10-03 MED ORDER — LIDOCAINE HCL (CARDIAC) PF 100 MG/5ML IV SOSY
PREFILLED_SYRINGE | INTRAVENOUS | Status: DC | PRN
  Administered 2020-10-03: 40 mg via INTRAVENOUS
  Administered 2020-10-03: 60 mg via INTRAVENOUS

## 2020-10-03 MED ORDER — HEPARIN SODIUM (PORCINE) 5000 UNIT/ML IJ SOLN
5000.0000 [IU] | Freq: Three times a day (TID) | INTRAMUSCULAR | Status: DC
  Administered 2020-10-05 – 2020-10-06 (×3): 5000 [IU] via SUBCUTANEOUS
  Filled 2020-10-03 (×4): qty 1

## 2020-10-03 MED ORDER — METHOCARBAMOL 500 MG PO TABS
500.0000 mg | ORAL_TABLET | Freq: Four times a day (QID) | ORAL | Status: DC | PRN
  Administered 2020-10-03 – 2020-10-05 (×6): 500 mg via ORAL
  Filled 2020-10-03 (×6): qty 1

## 2020-10-03 MED ORDER — MIDAZOLAM HCL 2 MG/2ML IJ SOLN
INTRAMUSCULAR | Status: AC
  Filled 2020-10-03: qty 2

## 2020-10-03 MED ORDER — SUGAMMADEX SODIUM 500 MG/5ML IV SOLN
INTRAVENOUS | Status: DC | PRN
  Administered 2020-10-03: 205 mg via INTRAVENOUS

## 2020-10-03 MED ORDER — ONDANSETRON HCL 4 MG PO TABS: 4.0000 mg | ORAL_TABLET | Freq: Four times a day (QID) | ORAL | Status: DC | PRN

## 2020-10-03 MED ORDER — OXYCODONE HCL 5 MG PO TABS
10.0000 mg | ORAL_TABLET | ORAL | Status: DC | PRN
  Administered 2020-10-04 – 2020-10-05 (×7): 10 mg via ORAL
  Filled 2020-10-03 (×7): qty 2

## 2020-10-03 MED ORDER — THROMBIN 5000 UNITS EX SOLR
OROMUCOSAL | Status: DC | PRN
  Administered 2020-10-03: 5 mL via TOPICAL

## 2020-10-03 MED ORDER — ACETAMINOPHEN 325 MG PO TABS
650.0000 mg | ORAL_TABLET | ORAL | Status: DC | PRN
  Administered 2020-10-04 – 2020-10-06 (×4): 650 mg via ORAL
  Filled 2020-10-03 (×5): qty 2

## 2020-10-03 MED ORDER — DEXAMETHASONE SODIUM PHOSPHATE 10 MG/ML IJ SOLN
INTRAMUSCULAR | Status: AC
  Filled 2020-10-03: qty 1

## 2020-10-03 MED ORDER — BISACODYL 10 MG RE SUPP: 10.0000 mg | Freq: Every day | RECTAL | Status: DC | PRN

## 2020-10-03 MED ORDER — DEXAMETHASONE SODIUM PHOSPHATE 10 MG/ML IJ SOLN
INTRAMUSCULAR | Status: DC | PRN
  Administered 2020-10-03: 10 mg via INTRAVENOUS

## 2020-10-03 MED ORDER — PHENYLEPHRINE 40 MCG/ML (10ML) SYRINGE FOR IV PUSH (FOR BLOOD PRESSURE SUPPORT)
PREFILLED_SYRINGE | INTRAVENOUS | Status: DC | PRN
  Administered 2020-10-03: 80 ug via INTRAVENOUS

## 2020-10-03 MED ORDER — CEFAZOLIN SODIUM-DEXTROSE 2-4 GM/100ML-% IV SOLN
2.0000 g | INTRAVENOUS | Status: AC
  Administered 2020-10-03 (×2): 2 g via INTRAVENOUS
  Filled 2020-10-03: qty 100

## 2020-10-03 MED ORDER — ROCURONIUM BROMIDE 100 MG/10ML IV SOLN
INTRAVENOUS | Status: DC | PRN
  Administered 2020-10-03: 40 mg via INTRAVENOUS
  Administered 2020-10-03: 60 mg via INTRAVENOUS
  Administered 2020-10-03: 40 mg via INTRAVENOUS
  Administered 2020-10-03: 20 mg via INTRAVENOUS
  Administered 2020-10-03 (×2): 40 mg via INTRAVENOUS

## 2020-10-03 MED ORDER — ZOLPIDEM TARTRATE 5 MG PO TABS
5.0000 mg | ORAL_TABLET | Freq: Every day | ORAL | Status: DC
  Administered 2020-10-03 – 2020-10-05 (×3): 5 mg via ORAL
  Filled 2020-10-03 (×3): qty 1

## 2020-10-03 MED ORDER — MEPERIDINE HCL 25 MG/ML IJ SOLN: 6.2500 mg | INTRAMUSCULAR | Status: DC | PRN

## 2020-10-03 MED ORDER — ORAL CARE MOUTH RINSE: 15.0000 mL | Freq: Once | OROMUCOSAL | Status: AC

## 2020-10-03 SURGICAL SUPPLY — 93 items
ADH SKN CLS APL DERMABOND .7 (GAUZE/BANDAGES/DRESSINGS) ×2
APL SKNCLS STERI-STRIP NONHPOA (GAUZE/BANDAGES/DRESSINGS)
BAG COUNTER SPONGE SURGICOUNT (BAG) ×4 IMPLANT
BAG SPNG CNTER NS LX DISP (BAG) ×2
BASKET BONE COLLECTION (BASKET) ×2 IMPLANT
BENZOIN TINCTURE PRP APPL 2/3 (GAUZE/BANDAGES/DRESSINGS) IMPLANT
BIT DRILL LONG 3.0X30 (BIT) IMPLANT
BIT DRILL LONG 3X80 (BIT) IMPLANT
BIT DRILL LONG 4X80 (BIT) IMPLANT
BIT DRILL SHORT 3.0X30 (BIT) IMPLANT
BIT DRILL SHORT 3X80 (BIT) IMPLANT
BLADE CLIPPER SURG (BLADE) IMPLANT
BLADE SURG 11 STRL SS (BLADE) ×4 IMPLANT
BUR MATCHSTICK NEURO 3.0 LAGG (BURR) ×2 IMPLANT
BUR PRECISION FLUTE 5.0 (BURR) ×2 IMPLANT
CAGE INTERBODY LONG 7X29X24 (Cage) ×1 IMPLANT
CANISTER SUCT 3000ML PPV (MISCELLANEOUS) ×2 IMPLANT
CARTRIDGE OIL MAESTRO DRILL (MISCELLANEOUS) ×1 IMPLANT
CATH FOLEY 2WAY  3CC  8FR (CATHETERS)
CATH FOLEY 2WAY 3CC 8FR (CATHETERS) IMPLANT
CATH FOLEY 2WAY SLVR  5CC 12FR (CATHETERS) ×2
CATH FOLEY 2WAY SLVR 5CC 12FR (CATHETERS) IMPLANT
CNTNR URN SCR LID CUP LEK RST (MISCELLANEOUS) ×1 IMPLANT
CONT SPEC 4OZ STRL OR WHT (MISCELLANEOUS) ×2
COVER BACK TABLE 60X90IN (DRAPES) ×2 IMPLANT
DECANTER SPIKE VIAL GLASS SM (MISCELLANEOUS) ×2 IMPLANT
DERMABOND ADVANCED (GAUZE/BANDAGES/DRESSINGS) ×2
DERMABOND ADVANCED .7 DNX12 (GAUZE/BANDAGES/DRESSINGS) ×1 IMPLANT
DIFFUSER DRILL AIR PNEUMATIC (MISCELLANEOUS) ×2 IMPLANT
DRAPE C-ARM 42X72 X-RAY (DRAPES) ×2 IMPLANT
DRAPE C-ARMOR (DRAPES) ×3 IMPLANT
DRAPE LAPAROTOMY 100X72X124 (DRAPES) ×2 IMPLANT
DRAPE SHEET LG 3/4 BI-LAMINATE (DRAPES) ×2 IMPLANT
DRAPE SURG 17X23 STRL (DRAPES) ×2 IMPLANT
DRSG OPSITE POSTOP 4X6 (GAUZE/BANDAGES/DRESSINGS) ×1 IMPLANT
DURAPREP 26ML APPLICATOR (WOUND CARE) ×2 IMPLANT
ELECT REM PT RETURN 9FT ADLT (ELECTROSURGICAL) ×2
ELECTRODE REM PT RTRN 9FT ADLT (ELECTROSURGICAL) ×1 IMPLANT
GAUZE 4X4 16PLY ~~LOC~~+RFID DBL (SPONGE) IMPLANT
GAUZE SPONGE 4X4 12PLY STRL (GAUZE/BANDAGES/DRESSINGS) IMPLANT
GLOVE EXAM NITRILE XL STR (GLOVE) IMPLANT
GLOVE SURG ENC MOIS LTX SZ7 (GLOVE) IMPLANT
GLOVE SURG ENC MOIS LTX SZ7.5 (GLOVE) IMPLANT
GLOVE SURG LTX SZ7 (GLOVE) ×4 IMPLANT
GLOVE SURG UNDER POLY LF SZ7 (GLOVE) IMPLANT
GLOVE SURG UNDER POLY LF SZ7.5 (GLOVE) ×4 IMPLANT
GOWN STRL REUS W/ TWL LRG LVL3 (GOWN DISPOSABLE) ×4 IMPLANT
GOWN STRL REUS W/ TWL XL LVL3 (GOWN DISPOSABLE) IMPLANT
GOWN STRL REUS W/TWL 2XL LVL3 (GOWN DISPOSABLE) IMPLANT
GOWN STRL REUS W/TWL LRG LVL3 (GOWN DISPOSABLE) ×8
GOWN STRL REUS W/TWL XL LVL3 (GOWN DISPOSABLE)
GRAFT BONE PROTEIOS SM 1CC (Orthopedic Implant) ×1 IMPLANT
GUIDEWIRE SHARP NITINOL 610 (WIRE) ×2 IMPLANT
HEMOSTAT POWDER KIT SURGIFOAM (HEMOSTASIS) ×2 IMPLANT
KIT BASIN OR (CUSTOM PROCEDURE TRAY) ×2 IMPLANT
KIT POSITION SURG JACKSON T1 (MISCELLANEOUS) ×2 IMPLANT
KIT SPINE MAZOR X ROBO DISP (MISCELLANEOUS) ×2 IMPLANT
KIT TURNOVER KIT B (KITS) ×2 IMPLANT
MILL MEDIUM DISP (BLADE) ×2 IMPLANT
NDL HYPO 18GX1.5 BLUNT FILL (NEEDLE) IMPLANT
NDL SPNL 18GX3.5 QUINCKE PK (NEEDLE) IMPLANT
NEEDLE HYPO 18GX1.5 BLUNT FILL (NEEDLE) IMPLANT
NEEDLE HYPO 22GX1.5 SAFETY (NEEDLE) ×2 IMPLANT
NEEDLE SPNL 18GX3.5 QUINCKE PK (NEEDLE) IMPLANT
NS IRRIG 1000ML POUR BTL (IV SOLUTION) ×2 IMPLANT
OIL CARTRIDGE MAESTRO DRILL (MISCELLANEOUS) ×2
PACK LAMINECTOMY NEURO (CUSTOM PROCEDURE TRAY) ×2 IMPLANT
PAD ARMBOARD 7.5X6 YLW CONV (MISCELLANEOUS) ×6 IMPLANT
PIN HEAD 2.5X60MM (PIN) IMPLANT
PUTTY GRAFTON DBF 9CC W/DELIVE (Putty) ×1 IMPLANT
ROD SOLERA 80MM (Rod) ×4 IMPLANT
ROD SOLERA 80X5.5XNS TI (Rod) IMPLANT
SCREW BS CNCMA S 8.5X80 T/C (Screw) ×2 IMPLANT
SCREW SCHANZ SA 4.0MM (MISCELLANEOUS) IMPLANT
SCREW SET SOLERA (Screw) ×12 IMPLANT
SCREW SET SOLERA TI5.5 (Screw) IMPLANT
SCREW SOLERA 45X6.5XMA (Screw) IMPLANT
SCREW SOLERA 50X6.5XMA (Screw) IMPLANT
SCREW SOLERA 6.5X45MM (Screw) ×4 IMPLANT
SCREW SOLERA 6.5X50MM (Screw) ×4 IMPLANT
SPONGE SURGIFOAM ABS GEL 100 (HEMOSTASIS) IMPLANT
SPONGE T-LAP 4X18 ~~LOC~~+RFID (SPONGE) IMPLANT
STRIP CLOSURE SKIN 1/2X4 (GAUZE/BANDAGES/DRESSINGS) IMPLANT
SUT PROLENE 6 0 BV (SUTURE) ×1 IMPLANT
SUT VIC AB 0 CT1 18XCR BRD8 (SUTURE) ×1 IMPLANT
SUT VIC AB 0 CT1 8-18 (SUTURE) ×6
SUT VICRYL 3-0 RB1 18 ABS (SUTURE) ×4 IMPLANT
SYR 3ML LL SCALE MARK (SYRINGE) ×6 IMPLANT
TOWEL GREEN STERILE (TOWEL DISPOSABLE) ×2 IMPLANT
TOWEL GREEN STERILE FF (TOWEL DISPOSABLE) ×2 IMPLANT
TRAY FOLEY MTR SLVR 16FR STAT (SET/KITS/TRAYS/PACK) ×1 IMPLANT
TUBE MAZOR SA REDUCTION (TUBING) ×2 IMPLANT
WATER STERILE IRR 1000ML POUR (IV SOLUTION) ×2 IMPLANT

## 2020-10-03 NOTE — Progress Notes (Signed)
[employer]tr[employer] rep Tobie Lords notified of patient's arrival to PACU.  Aware that ICD therapy needs to be turned back on.  Phone [phone]

## 2020-10-03 NOTE — Anesthesia Procedure Notes (Signed)
Procedure Name: Intubation Date/Time: 10/03/2020 1:19 PM Performed by: Jonna Munro, CRNA Pre-anesthesia Checklist: Patient identified, Emergency Drugs available, Suction available, Patient being monitored and Timeout performed Patient Re-evaluated:Patient Re-evaluated prior to induction Oxygen Delivery Method: Circle system utilized Preoxygenation: Pre-oxygenation with 100% oxygen Induction Type: IV induction Ventilation: Mask ventilation without difficulty Laryngoscope Size: Mac and 4 Grade View: Grade I Tube type: Oral Tube size: 7.5 mm Number of attempts: 1 Airway Equipment and Method: Stylet Secured at: 22 cm Tube secured with: Tape Dental Injury: Teeth and Oropharynx as per pre-operative assessment

## 2020-10-03 NOTE — Brief Op Note (Signed)
10/03/2020  7:15 PM  PATIENT:  [name]  65 y.o. male  PRE-OPERATIVE DIAGNOSIS:  DEGENERATIVE DISC DISEASE, LUMBAR  POST-OPERATIVE DIAGNOSIS:  DEGENERATIVE DISC DISEASE, LUMBAR  PROCEDURE:  Procedure(s): Posterior Lumbar Interbody Fusion Lumbar four-five;EXPLORE FUSION,EXTEND HARDWAR Lumbar four -sacral,POSS ILIAC FIXATION (N/A) APPLICATION OF ROBOTIC ASSISTANCE FOR SPINAL PROCEDURE (N/A)  SURGEON:  Surgeon(s) and Role:    * Lisbeth Renshaw, MD - Primary    * Dawley, Alan Mulder, DO - Assisting  PHYSICIAN ASSISTANT: None  ASSISTANTS: Dr. Monia Pouch, MD   ANESTHESIA:   general  EBL:  600 mL   BLOOD ADMINISTERED:none  DRAINS: none   LOCAL MEDICATIONS USED:  BUPIVICAINE  and XYLOCAINE   SPECIMEN:  No Specimen  DISPOSITION OF SPECIMEN:  N/A  COUNTS:  YES  TOURNIQUET:  * No tourniquets in log *  DICTATION: .Note written in EPIC  PLAN OF CARE: Admit to inpatient   PATIENT DISPOSITION:  PACU - hemodynamically stable.   Delay start of Pharmacological VTE agent (>24hrs) due to surgical blood loss or risk of bleeding: yes

## 2020-10-03 NOTE — H&P (Signed)
Chief Complaint  Back and leg pain  History of Present Illness  [name] is a 65 y.o. male initially seen in the outpatient neurosurgery clinic with relatively chronic back and leg pain.  Patient has a previous history of L5-S1 decompression and fusion many years ago.  His pain was initially managed reasonably well with a series of injections including epidural steroid injections, intra-articular hip injections, and bursal injections.  Unfortunately, more recently pain relief has been at best modest with the injections.  He continues to describe relatively severe pain on a daily basis in his back with radiation into the primarily right buttock and running down the back of the right leg down towards the calf.  He does also have more anterior right-sided hip and groin pain.  Past Medical History   Past Medical History:  Diagnosis Date   AICD (automatic cardioverter/defibrillator) present    Anxiety    Arthritis    "mild; back, neck, spine" (06/09/2017)   CHF (congestive heart failure) (HCC)    Chronic back pain    Coronary artery disease    Dyspnea    GERD (gastroesophageal reflux disease)    History of gout    History of kidney stones X 2   Hypercholesterolemia    Hypertension    LBBB (left bundle branch block)    MI (myocardial infarction) (HCC) 1993   LATERAL   MI (myocardial infarction) (HCC) ?09/2001; 06/07/2017   Pneumonia    "several times" (06/09/2017)   S/P coronary artery stent placement    RCA   Varicose veins     Past Surgical History   Past Surgical History:  Procedure Laterality Date   ANTERIOR CERVICAL DECOMP/DISCECTOMY FUSION  03/03/2011   Procedure: ANTERIOR CERVICAL DECOMPRESSION/DISCECTOMY FUSION 3 LEVELS;  Surgeon: Karn Cassis, MD;  Location: [facility];  Service: Neurosurgery;  Laterality: N/A;  Cervical three-four Cervical four-five Cervical five-six Anterior cervical decompression/diskectomy, fusion   BACK SURGERY     CARDIAC CATHETERIZATION  2009    Stents in RCA patent. 70 to 80% PL, and 50% ostial PD.    CARDIAC CATHETERIZATION N/A 11/20/2014   Procedure: Left Heart Cath and Coronary Angiography;  Surgeon: Peter M Swaziland, MD;  Location: [facility] INVASIVE CV LAB;  Service: Cardiovascular;  Laterality: N/A;   CORONARY ANGIOPLASTY     DIRECT ANGIOPLASTY THE MARGINAL BRANCH   CORONARY ANGIOPLASTY WITH STENT PLACEMENT     RCA   CORONARY ARTERY BYPASS GRAFT N/A 06/14/2017   Procedure: CORONARY ARTERY BYPASS GRAFTING (CABG) x four, using left internal mammary artery and right leg greater saphenous vein harvested endoscopically;  Surgeon: Kerin Perna, MD;  Location: [facility] OR;  Service: Open Heart Surgery;  Laterality: N/A;   CYSTOSCOPY N/A 06/14/2017   Procedure: CYSTOSCOPY;  Surgeon: Kerin Perna, MD;  Location: [facility] OR;  Service: Open Heart Surgery;  Laterality: N/A;   ICD IMPLANT N/A 03/11/2018   Procedure: ICD IMPLANT - Dual Chamber;  Surgeon: [name], MD;  Location: [facility] INVASIVE CV LAB;  Service: Cardiovascular;  Laterality: N/A;   INSERTION OF SUPRAPUBIC CATHETER N/A 06/14/2017   Procedure: INSERTION OF SUPRAPUBIC CATHETER - Lower abdomen;  Surgeon: Kerin Perna, MD;  Location: [facility] OR;  Service: Open Heart Surgery;  Laterality: N/A;   POSTERIOR LUMBAR FUSION  10/2011   RIGHT/LEFT HEART CATH AND CORONARY ANGIOGRAPHY N/A 06/08/2017   Procedure: RIGHT/LEFT HEART CATH AND CORONARY ANGIOGRAPHY;  Surgeon: Swaziland, Peter M, MD;  Location: [facility] INVASIVE CV LAB;  Service: Cardiovascular;  Laterality: N/A;   RIGHT/LEFT HEART CATH AND CORONARY/GRAFT ANGIOGRAPHY N/A 09/06/2020   Procedure: RIGHT/LEFT HEART CATH AND CORONARY/GRAFT ANGIOGRAPHY;  Surgeon: Swaziland, Peter M, MD;  Location: [facility] INVASIVE CV LAB;  Service: Cardiovascular;  Laterality: N/A;   TEE WITHOUT CARDIOVERSION N/A 06/14/2017   Procedure: TRANSESOPHAGEAL ECHOCARDIOGRAM (TEE);  Surgeon: Donata Clay, Theron Arista, MD;  Location: [facility] OR;  Service: Open Heart Surgery;  Laterality: N/A;   URETHROPLASTY  N/A 10/15/2017   Procedure: URETHROPLASTY WITH BUCCAL GRAFT HARVAST;  Surgeon: Crist Fat, MD;  Location: [facility] ORS;  Service: Urology;  Laterality: N/A;    Social History   Social History   Tobacco Use   Smoking status: Former    Packs/day: 2.00    Years: 20.00    Pack years: 40.00    Types: Cigarettes    Quit date: 05/07/1999    Years since quitting: 21.4   Smokeless tobacco: Never  Vaping Use   Vaping Use: Never used  Substance Use Topics   Alcohol use: Not Currently    Alcohol/week: 1.0 standard drink    Types: 1 Cans of beer per week   Drug use: Never    Medications   Prior to Admission medications   Medication Sig Start Date End Date Taking? Authorizing Provider  acetaminophen (TYLENOL) 500 MG tablet Take 1,000 mg by mouth in the morning and at bedtime. Morning & noon   Yes [provider]  aspirin 81 MG EC tablet Take 81 mg by mouth in the morning.   Yes [provider]  carvedilol (COREG) 6.25 MG tablet Take 0.5 tablets (3.125 mg total) by mouth 2 (two) times daily with a meal. Must have office visit for additional refills [phone] Patient taking differently: Take 3.125 mg by mouth 2 (two) times daily with a meal. Must have office visit for additional refills [phone] (Morning & Noon) 02/05/20  Yes Swaziland, Peter M, MD  ENTRESTO 24-26 MG TAKE 1/2 TABLET BY MOUTH 2 TIMES DAILY Patient taking differently: (Morning & noon) 08/18/19  Yes Swazil[place], Peter M, MD  esomeprazole (NEXIUM) 20 MG capsule TAKE 1 CAPSULE BY MOUTH EVERY DAY 03/27/20  Yes [place], Peter M, MD  Glucosamine-Chondroitin (OSTEO BI-FLEX REGULAR STRENGTH PO) Take 1 tablet by mouth 2 (two) times daily. Morning & noon   Yes [provider]  Multiple Vitamin (MULTIVITAMIN WITH MINERALS) TABS tablet Take 1 tablet by mouth in the morning and at bedtime. [employer]   Yes [provider]  OVER THE COUNTER MEDICATION Take 2 capsules by mouth at bedtime. Sleep 3 (Chamomile,  Lavender, Lemon Balm and Valerian Root)   Yes [provider]  REPATHA SURECLICK 140 MG/ML SOAJ INJECT INTO SKIN EVERY 14 DAYS 05/23/20  Yes Swazil[place], Peter M, MD  zolpidem (AMBIEN) 10 MG tablet Take 1 tablet (10 mg total) by mouth at bedtime. 10/01/20  Yes Tysinger, Kermit Balo, PA-C  empagliflozin (JARDIANCE) 10 MG TABS tablet Take 1 tablet (10 mg total) by mouth daily before breakfast. Patient not taking: No sig reported 09/06/20   Swaziland, Peter M, MD  furosemide (LASIX) 20 MG tablet Take 1 tablet (20 mg total) by mouth as needed. Patient taking differently: Take 10 mg by mouth as needed for edema. 08/14/20 11/12/20  Graciella Freer, PA-C  nitroGLYCERIN (NITROSTAT) 0.4 MG SL tablet Place 1 tablet (0.4 mg total) under the tongue every 5 (five) minutes as needed for chest pain. 08/14/20 11/12/20  Graciella Freer, PA-C    Allergies  Allergies  Allergen Reactions   Ezetimibe Other (See Comments)    Joint pain and drops BP   Niacin Other (See Comments)    Drops BP and severe   Statins Other (See Comments)    Severe joint pain and drops BP.    Review of Systems  ROS  Neurologic Exam  Awake, alert, oriented Memory and concentration grossly intact Speech fluent, appropriate CN grossly intact Motor exam: Upper Extremities Deltoid Bicep Tricep Grip  Right 5/5 5/5 5/5 5/5  Left 5/5 5/5 5/5 5/5   Lower Extremities IP Quad PF DF EHL  Right 5/5 5/5 5/5 5/5 5/5  Left 5/5 5/5 5/5 5/5 5/5   Sensation grossly intact to LT  Imaging  CT myelogram dated 04/11/2020 was reviewed.  At L4-5 there is moderate canal stenosis related primarily to facet hypertrophy as well as moderate to severe right greater than left lateral recess and foraminal stenosis.  There is significant disc degeneration with loss of height.  The previous L5-S1 construct does not demonstrate clear evidence of fusion, with fracture of both S1 pedicle screws.  Impression  - 65 y.o. male with previous L5-S1 fusion  and likely pseudoarthrosis with adjacent level stenosis at L4-5.  Patient is symptomatic with both back and right greater than left leg pain and has failed conservative treatment.  Plan  -We will plan on proceeding with decompression and fusion at L4-5, with exploration of previous fusion and likely extension to the ileum.  I have reviewed the imaging findings as well as the treatment options at length with the patient in the office.  We have discussed the details of the surgery as well as the expected postoperative course and recovery.  We have also discussed the associated risks, benefits, and alternatives to surgery.  All his questions today were answered and he provided informed consent to proceed.   Lisbeth Renshaw, MD [facility]

## 2020-10-03 NOTE — Progress Notes (Signed)
Called [employer] rep, Tobie Lords, again to give him patient's room assignment.  He will relay the information to the [employer] rep on call and he stated she will be at [facility] tonight to turn on patient's ICD.

## 2020-10-03 NOTE — Progress Notes (Signed)
Orthopedic Tech Progress Note Patient Details:  [name] 11-27-1955 [id number]  Communicated with [name], RN, who was in the room with him when I arrived that it was okay to leave the brace at bedside. Adjusted brace to appropriate waist size.   Ortho Devices Type of Ortho Device: Other (comment) (ASPEN LUMBAR BRACE) Ortho Device/Splint Location: at bedside Ortho Device/Splint Interventions: Adjustment, Ordered   Post Interventions Patient Tolerated: Other (comment) Instructions Provided: Adjustment of device, Care of device  Kenleigh Toback Carmine Savoy 10/03/2020, 10:55 PM

## 2020-10-03 NOTE — Transfer of Care (Signed)
Immediate Anesthesia Transfer of Care Note  Patient: [name]  Procedure(s) Performed: Posterior Lumbar Interbody Fusion Lumbar four-five;EXPLORE FUSION,EXTEND HARDWAR Lumbar four -sacral,POSS ILIAC FIXATION APPLICATION OF ROBOTIC ASSISTANCE FOR SPINAL PROCEDURE  Patient Location: PACU  Anesthesia Type:General  Level of Consciousness: awake, alert , oriented and patient cooperative  Airway & Oxygen Therapy: Patient Spontanous Breathing and Patient connected to nasal cannula oxygen  Post-op Assessment: Report given to RN and Post -op Vital signs reviewed and stable  Post vital signs: Reviewed and stable  Last Vitals:  Vitals Value Taken Time  BP 95/54 10/03/20 1858  Temp    Pulse 77 10/03/20 1903  Resp 16 10/03/20 1858  SpO2 97 % 10/03/20 1903  Vitals shown include unvalidated device data.  Last Pain:  Vitals:   10/03/20 1858  TempSrc:   PainSc: 9          Complications: No notable events documented.

## 2020-10-03 NOTE — Progress Notes (Signed)
Assessment took place @ 1137

## 2020-10-04 ENCOUNTER — Ambulatory Visit: Payer: PPO | Admitting: Cardiology

## 2020-10-04 ENCOUNTER — Encounter (HOSPITAL_COMMUNITY): Payer: Self-pay | Admitting: Neurosurgery

## 2020-10-04 LAB — CBC
HCT: 37.4 % — ABNORMAL LOW (ref 39.0–52.0)
Hemoglobin: 11.9 g/dL — ABNORMAL LOW (ref 13.0–17.0)
MCH: 32.4 pg (ref 26.0–34.0)
MCHC: 31.8 g/dL (ref 30.0–36.0)
MCV: 101.9 fL — ABNORMAL HIGH (ref 80.0–100.0)
Platelets: 205 10*3/uL (ref 150–400)
RBC: 3.67 MIL/uL — ABNORMAL LOW (ref 4.22–5.81)
RDW: 13.5 % (ref 11.5–15.5)
WBC: 14 10*3/uL — ABNORMAL HIGH (ref 4.0–10.5)
nRBC: 0 % (ref 0.0–0.2)

## 2020-10-04 LAB — BASIC METABOLIC PANEL
Anion gap: 9 (ref 5–15)
BUN: 20 mg/dL (ref 8–23)
CO2: 27 mmol/L (ref 22–32)
Calcium: 8.6 mg/dL — ABNORMAL LOW (ref 8.9–10.3)
Chloride: 99 mmol/L (ref 98–111)
Creatinine, Ser: 0.89 mg/dL (ref 0.61–1.24)
GFR, Estimated: >60 mL/min (ref >60)
Glucose, Bld: 139 mg/dL — ABNORMAL HIGH (ref 70–99)
Potassium: 4.6 mmol/L (ref 3.5–5.1)
Sodium: 135 mmol/L (ref 135–145)

## 2020-10-04 MED ORDER — HYDROMORPHONE HCL 1 MG/ML IJ SOLN
1.0000 mg | INTRAMUSCULAR | Status: DC | PRN
  Administered 2020-10-04 – 2020-10-05 (×3): 1 mg via INTRAVENOUS
  Filled 2020-10-04 (×3): qty 1

## 2020-10-04 MED ORDER — CHLORHEXIDINE GLUCONATE CLOTH 2 % EX PADS: 6.0000 | MEDICATED_PAD | Freq: Every day | CUTANEOUS | Status: DC

## 2020-10-04 NOTE — Evaluation (Signed)
Occupational Therapy Evaluation Patient Details Name: [name] MRN: [id number] DOB: 1955/05/29 Today's Date: 10/04/2020   History of Present Illness [name] is a 65 y.o. male initially seen in the outpatient neurosurgery clinic with relatively chronic back and leg pain.  Patient has a previous history of L5-S1 decompression and fusion many years ago. S/P lumbar fusion L4-5.  PMH includes: MI, HTN, CHF, cervical fusion   Clinical Impression   Pt admitted for procedure listed above.  PTA pt reported that he was independent with all ADL's and IADL's, sometimes using a RW when his pain was too high. At this time, pt continues to demonstrate independence with ADL's, using compensatory techniques. He has no further OT needs at this time and  acute OT will sign off.    Recommendations for follow up therapy are one component of a multi-disciplinary discharge planning process, led by the attending physician.  Recommendations may be updated based on patient status, additional functional criteria and insurance authorization.   Follow Up Recommendations  No OT follow up    Equipment Recommendations  None recommended by OT    Recommendations for Other Services       Precautions / Restrictions Precautions Precautions: Back Precaution Comments: Reviewed precautions. Patient able to recall 2/3 from prior surgery Required Braces or Orthoses: Spinal Brace Spinal Brace: Lumbar corset;Applied in sitting position Restrictions Weight Bearing Restrictions: No      Mobility Bed Mobility               General bed mobility comments: patient received in recliner    Transfers Overall transfer level: Modified independent Equipment used: Rolling walker (2 wheeled)             General transfer comment: sit to stand with supervision only    Balance Overall balance assessment: Modified Independent                                         ADL either performed or  assessed with clinical judgement   ADL Overall ADL's : Modified independent                                       General ADL Comments: Pt able to complete all ADL's with compensatory strategies and following back precautions.     Vision Baseline Vision/History: 0 No visual deficits Ability to See in Adequate Light: 0 Adequate Patient Visual Report: No change from baseline Vision Assessment?: No apparent visual deficits     Perception Perception Perception Tested?: No   Praxis Praxis Praxis tested?: Within functional limits    Pertinent Vitals/Pain Pain Assessment: 0-10 Pain Score: 5  Pain Location: back Pain Descriptors / Indicators: Discomfort;Sore Pain Intervention(s): Limited activity within patient's tolerance;Monitored during session;Repositioned;Premedicated before session     Hand Dominance Right   Extremity/Trunk Assessment Upper Extremity Assessment Upper Extremity Assessment: Overall WFL for tasks assessed   Lower Extremity Assessment Lower Extremity Assessment: Defer to PT evaluation   Cervical / Trunk Assessment Cervical / Trunk Assessment: Normal;Other exceptions Cervical / Trunk Exceptions: s/p lumbar surgery   Communication Communication Communication: No difficulties   Cognition Arousal/Alertness: Awake/alert Behavior During Therapy: WFL for tasks assessed/performed Overall Cognitive Status: Within Functional Limits for tasks assessed  General Comments  VSS on RA, back brace on with no difficulty    Exercises     Shoulder Instructions      Home Living Family/patient expects to be discharged to:: Private residence Living Arrangements: Spouse/significant other Available Help at Discharge: Family;Available 24 hours/day Type of Home: House Home Access: Stairs to enter [employer] of Steps: 2 Entrance Stairs-Rails: Can reach both;Left;Right Home Layout: One  level     Bathroom Shower/Tub: Producer, television/film/video: Handicapped height Bathroom Accessibility: Yes How Accessible: Accessible via walker Home Equipment: Walker - 2 wheels          Prior Functioning/Environment Level of Independence: Independent        Comments: retired, was having to use walker occasionally as needed prior to this surgery        OT Problem List: Decreased strength;Decreased activity tolerance;Pain      OT Treatment/Interventions:      OT Goals(Current goals can be found in the care plan section) Acute Rehab OT Goals Patient Stated Goal: return home OT Goal Formulation: With patient Time For Goal Achievement: 10/04/20 Potential to Achieve Goals: Good  OT Frequency:     Barriers to D/C:            Co-evaluation              AM-PAC OT "6 Clicks" Daily Activity     Outcome Measure Help from another person eating meals?: None Help from another person taking care of personal grooming?: None Help from another person toileting, which includes using toliet, bedpan, or urinal?: None Help from another person bathing (including washing, rinsing, drying)?: None Help from another person to put on and taking off regular upper body clothing?: None Help from another person to put on and taking off regular lower body clothing?: None 6 Click Score: 24   End of Session Equipment Utilized During Treatment: Back brace;Rolling walker Nurse Communication: Mobility status  Activity Tolerance: Patient tolerated treatment well Patient left: in chair;with call bell/phone within reach;with family/visitor present  OT Visit Diagnosis: Unsteadiness on feet (R26.81);Muscle weakness (generalized) (M62.81);Pain Pain - part of body:  (back)                Time: 0981-1914 OT Time Calculation (min): 16 min Charges:  OT General Charges $OT Visit: 1 Visit OT Evaluation $OT Eval Low Complexity: 1 Low  Yardley Beltran H., OTR/L Acute Rehabilitation  Jamone Garrido Elane  Avon Mergenthaler 10/04/2020, 2:12 PM

## 2020-10-04 NOTE — Progress Notes (Signed)
Called pharmacy, patient stated they take 1/2 entresto 2x daily. Pharmacist said patient should be taking full pill, patient wants to speak to MD.

## 2020-10-04 NOTE — Evaluation (Signed)
Physical Therapy Evaluation Patient Details Name: [name] MRN: [id number] DOB: January 28, 1955 Today's Date: 10/04/2020  History of Present Illness  VONTRELL PULLMAN is a 65 y.o. male initially seen in the outpatient neurosurgery clinic with relatively chronic back and leg pain.  Patient has a previous history of L5-S1 decompression and fusion many years ago. S/P lumbar fusion L4-5.  PMH includes: MI, HTN, CHF, cervical fusion   Clinical Impression  Patient received in recliner. He is pleasant and agreeable to PT session. He is mod independent with sit to stand and supervision for ambulation with RW and up/down steps with B rails.  Patient moving well and does not require skilled PT follow up while here. He will hopefully be discharged today.          Recommendations for follow up therapy are one component of a multi-disciplinary discharge planning process, led by the attending physician.  Recommendations may be updated based on patient status, additional functional criteria and insurance authorization.  Follow Up Recommendations Follow surgeon's recommendation for DC plan and follow-up therapies    Equipment Recommendations  None recommended by PT    Recommendations for Other Services       Precautions / Restrictions Precautions Precautions: Back Precaution Comments: Reviewed precautions. Patient able to recall 2/3 from prior surgery Required Braces or Orthoses: Spinal Brace Spinal Brace: Lumbar corset;Applied in sitting position Restrictions Weight Bearing Restrictions: No      Mobility  Bed Mobility               General bed mobility comments: patient received in recliner    Transfers Overall transfer level: Modified independent Equipment used: Rolling walker (2 wheeled)             General transfer comment: sit to stand with supervision only  Ambulation/Gait Ambulation/Gait assistance: Supervision Gait Distance (Feet): 150 Feet Assistive device: Rolling  walker (2 wheeled) Gait Pattern/deviations: Step-through pattern Gait velocity: WFL   General Gait Details: generally good mobility  Stairs Stairs: Yes Stairs assistance: Supervision Stair Management: Alternating pattern;Two rails;Forwards Number of Stairs: 2 General stair comments: safe on steps  Wheelchair Mobility    Modified Rankin (Stroke Patients Only)       Balance Overall balance assessment: Modified Independent                                           Pertinent Vitals/Pain Pain Assessment: 0-10 Pain Score: 5  Pain Location: back Pain Descriptors / Indicators: Discomfort;Sore Pain Intervention(s): Monitored during session;Limited activity within patient's tolerance;Repositioned;Premedicated before session    Home Living Family/patient expects to be discharged to:: Private residence Living Arrangements: Spouse/significant other Available Help at Discharge: Family;Available 24 hours/day Type of Home: House Home Access: Stairs to enter Entrance Stairs-Rails: Can reach both;Left;Right Entrance Stairs-Number of Steps: 2 Home Layout: One level Home Equipment: Walker - 2 wheels      Prior Function Level of Independence: Independent         Comments: retired, was having to use walker occasionally as needed prior to this surgery     Hand Dominance        Extremity/Trunk Assessment   Upper Extremity Assessment Upper Extremity Assessment: Defer to OT evaluation    Lower Extremity Assessment Lower Extremity Assessment: Generalized weakness    Cervical / Trunk Assessment Cervical / Trunk Assessment: Normal  Communication   Communication: No difficulties  Cognition Arousal/Alertness: Awake/alert Behavior During Therapy: WFL for tasks assessed/performed Overall Cognitive Status: Within Functional Limits for tasks assessed                                        General Comments      Exercises      Assessment/Plan    PT Assessment Patent does not need any further PT services  PT Problem List         PT Treatment Interventions      PT Goals (Current goals can be found in the Care Plan section)  Acute Rehab PT Goals Patient Stated Goal: return home PT Goal Formulation: With patient Time For Goal Achievement: 10/05/20 Potential to Achieve Goals: Good    Frequency     Barriers to discharge        Co-evaluation               AM-PAC PT "6 Clicks" Mobility  Outcome Measure Help needed turning from your back to your side while in a flat bed without using bedrails?: A Little Help needed moving from lying on your back to sitting on the side of a flat bed without using bedrails?: A Little Help needed moving to and from a bed to a chair (including a wheelchair)?: None Help needed standing up from a chair using your arms (e.g., wheelchair or bedside chair)?: None Help needed to walk in hospital room?: None Help needed climbing 3-5 steps with a railing? : None 6 Click Score: 22    End of Session Equipment Utilized During Treatment: Back brace Activity Tolerance: Patient tolerated treatment well Patient left: in chair;with call bell/phone within reach;with family/visitor present Nurse Communication: Mobility status PT Visit Diagnosis: Muscle weakness (generalized) (M62.81);Pain Pain - part of body:  (back)    Time: 1610-9604 PT Time Calculation (min) (ACUTE ONLY): 13 min   Charges:   PT Evaluation $PT Eval Low Complexity: 1 Low          Gwendoline Judy, PT, GCS 10/04/20,11:39 AM

## 2020-10-04 NOTE — Anesthesia Postprocedure Evaluation (Signed)
Anesthesia Post Note  Patient: [name]  Procedure(s) Performed: Posterior Lumbar Interbody Fusion Lumbar four-five;EXPLORE FUSION,EXTEND HARDWAR Lumbar four -sacral,POSS ILIAC FIXATION APPLICATION OF ROBOTIC ASSISTANCE FOR SPINAL PROCEDURE     Patient location during evaluation: PACU Anesthesia Type: General Level of consciousness: awake and alert Pain management: pain level controlled Vital Signs Assessment: post-procedure vital signs reviewed and stable Respiratory status: spontaneous breathing, nonlabored ventilation, respiratory function stable and patient connected to nasal cannula oxygen Cardiovascular status: blood pressure returned to baseline and stable Postop Assessment: no apparent nausea or vomiting Anesthetic complications: no   No notable events documented.            Shelton Silvas

## 2020-10-04 NOTE — Plan of Care (Signed)

## 2020-10-04 NOTE — TOC Initial Note (Signed)
Transition of Care [facility]) - Initial/Assessment Note    Patient Details  Name: [name] MRN: [id number] Date of Birth: January 16, 1955  Transition of Care [facility]) CM/SW Contact:    Kermit Balo, RN Phone Number: 10/04/2020, 4:14 PM  Clinical Narrative:                 Patient lives at home with spouse that can provide needed supervision and assistance. No f/u per PT/OT.  Pt denies issues with home medications or transportation. Wife will transport home when medically ready.  Expected Discharge Plan: Home/Self Care Barriers to Discharge: Continued Medical Work up   Patient Goals and CMS Choice        Expected Discharge Plan and Services Expected Discharge Plan: Home/Self Care       Living arrangements for the past 2 months: Single Family Home                                      Prior Living Arrangements/Services Living arrangements for the past 2 months: Single Family Home Lives with:: Spouse Patient language and need for interpreter reviewed:: Yes Do you feel safe going back to the place where you live?: Yes        Care giver support system in place?: Yes (comment) Current home services: DME (walker) Criminal Activity/Legal Involvement Pertinent to Current Situation/Hospitalization: No - Comment as needed  Activities of Daily Living Home Assistive Devices/Equipment: Blood pressure cuff, Scales ADL Screening (condition at time of admission) Patient's cognitive ability adequate to safely complete daily activities?: Yes Is the patient deaf or have difficulty hearing?: No Does the patient have difficulty seeing, even when wearing glasses/contacts?: No Does the patient have difficulty concentrating, remembering, or making decisions?: No Patient able to express need for assistance with ADLs?: Yes Does the patient have difficulty dressing or bathing?: No Independently performs ADLs?: Yes (appropriate for developmental age) Does the patient have difficulty  walking or climbing stairs?: No Weakness of Legs: Both Weakness of Arms/Hands: Both  Permission Sought/Granted                  Emotional Assessment Appearance:: Appears stated age Attitude/Demeanor/Rapport: Engaged Affect (typically observed): Accepting Orientation: : Oriented to Self, Oriented to Place, Oriented to  Time, Oriented to Situation   Psych Involvement: No (comment)  Admission diagnosis:  Lumbar back pain [M54.50] Pseudoarthrosis of lumbar spine [S32.009K] Patient Active Problem List   Diagnosis Date Noted   Lumbar back pain 10/03/2020   Pseudoarthrosis of lumbar spine 10/03/2020   Unstable angina (HCC) 09/06/2020   Encounter for health maintenance examination in adult 09/03/2020   Encounter for screening for malignant neoplasm of colon 09/03/2020   Impaired fasting blood sugar 09/03/2020   Fatigue 09/03/2020   Screening for prostate cancer 09/03/2020   History of gout 09/03/2020   Medicare welcome exam 09/03/2020   Insomnia 01/11/2020   Back pain 01/11/2020   History of urinary tract infection 01/11/2020   Vaccine counseling 01/11/2020   Rumination 01/11/2020   Anxiety 01/11/2020   Chronic diarrhea 12/05/2019   Food intolerance 12/05/2019   Abdominal cramping 12/05/2019   Abdominal bloating 12/05/2019   Need for influenza vaccination 12/05/2019   Right knee pain 11/03/2019   Shoulder pain, acute 06/09/2019   Hip pain 02/24/2019   Knee pain 02/24/2019   Bulbous urethral stricture 10/15/2017   Personal history of urethral stricture 08/10/2017   Chronic  systolic heart failure (HCC) 06/22/2017   S/P CABG x 4 06/14/2017   ACS (acute coronary syndrome) (HCC) 06/08/2017   Angina pectoris (HCC)    Gout 08/30/2014   Neck pain, chronic 12/17/2010   Back pain, chronic 12/01/2010   Pericarditis 10/02/2010   CAD (coronary artery disease) 05/14/2010   Hypertension    Hypercholesterolemia    LBBB (left bundle branch block)    NSTEMI (non-ST elevated  myocardial infarction) (HCC)    PCP:  [name], PA-C Pharmacy:   [facility] [place], [place] - [address] [place] [address] Phone: [phone] Fax: [phone]  [facility] - [place], [place] - [address] [place] [address] Phone: [phone] Fax: [phone]     Social Determinants of Health (SDOH) Interventions    Readmission Risk Interventions No flowsheet data found.

## 2020-10-04 NOTE — Progress Notes (Signed)
Patient having little relief with PRN pain medications, requesting to have medications scheduled. [facility] office called, message left with Diplomatic Services operational officer.

## 2020-10-04 NOTE — Progress Notes (Signed)
  NEUROSURGERY PROGRESS NOTE   No issues overnight. Pt reports appropriate back pain. Has ambulated in hallway and voided after Foley d/c. Does not report any hip or leg pain.  EXAM:  BP (!) 141/81 (BP Location: Right Arm)   Pulse 85   Temp 97.9 F (36.6 C) (Oral)   Resp 16   Ht 6\' 1"  (1.854 m)   Wt 102.5 kg   SpO2 92%   BMI 29.82 kg/m   Awake, alert, oriented  Speech fluent, appropriate  CN grossly intact  5/5 BUE/BLE   IMPRESSION:  65 y.o. male POD#1 L4-5 PLIF, extension of fusion L4-Ilium. Doing well  PLAN: - Cont to mobilize as tolerated. - Pt requests d/c home tomorrow   71, MD [facility]

## 2020-10-05 MED ORDER — HYDROCODONE-ACETAMINOPHEN 10-325 MG PO TABS
1.0000 | ORAL_TABLET | ORAL | Status: DC | PRN
  Administered 2020-10-05 (×2): 2 via ORAL
  Administered 2020-10-06: 1 via ORAL
  Administered 2020-10-06 (×2): 2 via ORAL
  Filled 2020-10-05 (×3): qty 2
  Filled 2020-10-05: qty 1
  Filled 2020-10-05: qty 2

## 2020-10-05 MED ORDER — KETOROLAC TROMETHAMINE 30 MG/ML IJ SOLN
30.0000 mg | Freq: Four times a day (QID) | INTRAMUSCULAR | Status: DC
  Administered 2020-10-05 – 2020-10-06 (×4): 30 mg via INTRAVENOUS
  Filled 2020-10-05 (×4): qty 1

## 2020-10-05 MED ORDER — SODIUM CHLORIDE 0.9 % IV SOLN
12.5000 mg | Freq: Four times a day (QID) | INTRAVENOUS | Status: DC | PRN
  Filled 2020-10-05: qty 0.5

## 2020-10-05 NOTE — Progress Notes (Signed)
Called [facility], patient's post op honeycomb dressing was fully saturated, Hildred Priest NP called back. Site wasn't draining after removing the post op dressing, applied pressure dressing per NP

## 2020-10-05 NOTE — Progress Notes (Signed)
Patient having a lot of difficulty with hiccups.  These are constant and cause increase in his back pain.  Aside from the hiccups he is doing well however.  He is having no lower extremity symptoms.  He is been up ambulating reasonably well.  He is afebrile.  His vital signs are stable.  He continues to have difficulty with hiccups throughout.  His wound is clean and dry.  His motor and sensory function are intact.  Belly is soft.  He has had 2 small bowel movements.  Progressing recent well following his lumbar surgery.  I think the hiccups may be related to his oxycodone and we will stop that and readjust his pain medication.  We will also add Phenergan to hopefully help with some of the wake-up symptoms.

## 2020-10-05 NOTE — Plan of Care (Signed)

## 2020-10-06 MED ORDER — METHOCARBAMOL 500 MG PO TABS: 500.0000 mg | ORAL_TABLET | Freq: Four times a day (QID) | ORAL | 1 refills | Status: DC | PRN

## 2020-10-06 MED ORDER — HYDROCODONE-ACETAMINOPHEN 10-325 MG PO TABS: 1.0000 | ORAL_TABLET | ORAL | 0 refills | Status: DC | PRN

## 2020-10-06 NOTE — Plan of Care (Signed)

## 2020-10-06 NOTE — Discharge Summary (Signed)
Physician Discharge Summary  Patient ID: [name] MRN: [id number] DOB/AGE: May 14, 1955 65 y.o.  Admit date: 10/03/2020 Discharge date: 10/06/2020  Admission Diagnoses:  Discharge Diagnoses:  Active Problems:   Lumbar back pain   Pseudoarthrosis of lumbar spine   Discharged Condition: good  Hospital Course: Patient mated to the hospital where he underwent uncomplicated lumbar decompression fusion.  Postoperatively patient has done reasonably well.  He had normal postoperative pain and mobilize reasonably well.  He has difficulty with hiccups which gradually improved with time and medication changes.  Currently his pain is well controlled.  He is ambulating without difficulty.  He is voiding well.  He is ready for discharge home. Consults:   Significant Diagnostic Studies:   Treatments:   Discharge Exam: Blood pressure (!) 116/57, pulse 75, temperature 98.7 F (37.1 C), temperature source Oral, resp. rate 17, height 6\' 1"  (1.854 m), weight 102.5 kg, SpO2 97 %. Awake and alert.  Oriented and appropriate.  Motor and sensory function intact.  Wound clean and dry.  Chest and abdomen.  Disposition: Discharge disposition: 01-Home or Self Care        Allergies as of 10/06/2020       Reactions   Ezetimibe Other (See Comments)   Joint pain and drops BP   Niacin Other (See Comments)   Drops BP and severe   Statins Other (See Comments)   Severe joint pain and drops BP.        Medication List     TAKE these medications    acetaminophen 500 MG tablet Commonly known as: TYLENOL Take 1,000 mg by mouth in the morning and at bedtime. Morning & noon   aspirin 81 MG EC tablet Take 81 mg by mouth in the morning.   carvedilol 6.25 MG tablet Commonly known as: COREG Take 0.5 tablets (3.125 mg total) by mouth 2 (two) times daily with a meal. Must have office visit for additional refills [phone] What changed: additional instructions   empagliflozin 10 MG Tabs  tablet Commonly known as: Jardiance Take 1 tablet (10 mg total) by mouth daily before breakfast.   Entresto 24-26 MG Generic drug: sacubitril-valsartan TAKE 1/2 TABLET BY MOUTH 2 TIMES DAILY What changed: See the new instructions.   esomeprazole 20 MG capsule Commonly known as: NEXIUM TAKE 1 CAPSULE BY MOUTH EVERY DAY   furosemide 20 MG tablet Commonly known as: LASIX Take 1 tablet (20 mg total) by mouth as needed. What changed:  how much to take reasons to take this   HYDROcodone-acetaminophen 10-325 MG tablet Commonly known as: NORCO Take 1-2 tablets by mouth every 4 (four) hours as needed for moderate pain.   methocarbamol 500 MG tablet Commonly known as: ROBAXIN Take 1 tablet (500 mg total) by mouth every 6 (six) hours as needed for muscle spasms.   multivitamin with minerals Tabs tablet Take 1 tablet by mouth in the morning and at bedtime. [employer] Mega Men   nitroGLYCERIN 0.4 MG SL tablet Commonly known as: NITROSTAT Place 1 tablet (0.4 mg total) under the tongue every 5 (five) minutes as needed for chest pain.   OSTEO BI-FLEX REGULAR STRENGTH PO Take 1 tablet by mouth 2 (two) times daily. Morning & noon   OVER THE COUNTER MEDICATION Take 2 capsules by mouth at bedtime. Sleep 3 (Chamomile, Lavender, Lemon Balm and Valerian Root)   Repatha SureClick 140 MG/ML Soaj Generic drug: Evolocumab INJECT INTO SKIN EVERY 14 DAYS   zolpidem 10 MG tablet Commonly known as: AMBIEN  Take 1 tablet (10 mg total) by mouth at bedtime.         Signed: Kathaleen Maser Ayaat [name] 10/06/2020, 7:10 AM

## 2020-10-06 NOTE — Discharge Instructions (Signed)

## 2020-10-07 NOTE — Op Note (Signed)
NEUROSURGERY OPERATIVE NOTE   PREOP DIAGNOSIS:   Lumbar stenosis with radiculopathy  Degenerative disc disease, L4-5  Pseudoarthrosis, L5-S1  Hardware failure, S1   POSTOP DIAGNOSIS: Same  PROCEDURE:  Complete L4 laminectomy with radical L4-5 facetectomy for decompression of thecal sac and exiting nerve roots, more than would be required for placement of interbody cages  Exploration of fusion, L5-S1  Removal of previous hardware, L5-S1  Posterior segmental instrumentation L4- ilium including placement of S2 alar iliac screws bilaterally  Interbody arthrodesis, L4-5  Use of locally harvested morselized bone autograft  Use of nonstructural bone allograft, ProteOS  SURGEON: Dr. Lisbeth Renshaw, MD  ASSISTANT: Dr. Monia Pouch, MD  ANESTHESIA: General Endotracheal  EBL: 600cc  SPECIMENS: None  DRAINS: None  COMPLICATIONS: None immediate  CONDITION: Hemodynamically stable to PACU  HISTORY: [name] is a 65 y.o. male   initially seen in the Outpatient Neurosurgery Clinic as a previous patient of our retired partner, Dr. Jeral Fruit.  He has previously undergone L5-S1 decompression and fusion many years back.  He has been dealing with chronic back and bilateral leg pain as well as component of hip pain chronically.  This has been managed with epidural steroid injections, intra-articular hip injections, and bursal injections.  Unfortunately, he notes continued worsening of his pain both in the back as well as radiation primarily into the right buttock and down the right leg.  His imaging has revealed severe disc degenerative disease at the adjacent L4-5 level with associated stenosis, and bilateral S1 screw fracture with likely pseudoarthrosis. After lengthy discussion he  elected to proceed with   Surgical decompression, exploration of fusion, and extension of hardware.  The risks, benefits, and alternatives of surgery were all reviewed in detail with the patient.  After all his  questions were answered informed consent was obtained and witnessed.  PROCEDURE IN DETAIL: The patient was brought to the operating room. After induction of general anesthesia, the patient was positioned on the operative table in the prone position. All pressure points were meticulously padded. Previous skin incision was then marked out and prepped and draped in the usual sterile fashion.   After time-out was conducted, the previous incision was infiltrated with local anesthetic with epinephrine.  The previous incision was then opened sharply and carried down through the subcutaneous tissue.  The incision was also extended by approximately 1/2 cm inferiorly and superiorly.  Bovie electrocautery was used to dissect in the subperiosteal plane and the inferior margin of the L3 lamina, the L4 lamina, and the previously placed L5 and S1 screws were identified.  Dissection was also carried out along the sacrum out laterally from the sacral foramina.    At this point a small stab incision was made over the right posterior superior iliac spine.  Schanz pin was placed.  The Mazor robotic system was then connected to the patient.  Utilizing the preoperative stereotactic CT scan, the robot was registered using AP and lateral fluoroscopic images.  A good registration was achieved.  The robotic system was then used to plan out S2 alar iliac screws bilaterally.  Robotic arm was then used to aid in the placement of the S2 a high screws.  These were initially drilled to a depth of approximately 8 cm.  Trajectory was checked with a sound and good bone was noted circumferentially.  The pilot holes were then tapped to 7.5 mm, and 8.5 x 80 mm screws were placed bilaterally.  Trajectory of the screws were then checked  using both AP, lateral, and oblique fluoroscopy.     I then proceeded with removal of the previous hardware.  There was some bone overlying the L5 and S1 screws which were is removed with a high-speed drill and  rongeurs.  The set screws were then removed, the rod was removed,  and I did apply dorsal traction on the L5 screws and was not convinced that there was solid arthrodesis between L5 and S1.  Moreover, the S1 screws were easily removed with dorsal traction having broken proximally on the screw shanks.   I therefore also elected to proceed with removal of the L5 screws.  These were loosened and removed with a screwdriver.    At this point attention was turned to the adjacent L4-5 level.  Complete laminectomy was completed.  There was a very small durotomy created on the dorsal surface of the thecal sac which was closed with a single six 0 Prolene stitch.  The pars interarticularis at L4 was identified and the high-speed drill was used to cut across the pars.  This allowed removal of the inferior articulating process of L4.  Kerrison punches were then used to completely decompress the lateral recesses and the L4 and L5 nerve roots were identified bilaterally.  These were noted to be well decompressed after removal of the facet complexes.  Disc space was then identified, and was entered using osteotome.  Progressively larger Shavers were then used to remove disc material.  This was done bilaterally.  This did require the assistance of disc space spreader.  Once diskectomy was completed, the endplates were prepared using rasps.  Bone graft mixed with autograft harvested during decompression as well as the ProteOs  was then packed into the disc space.  A 7 mm expandable hockey shaped cage was then selected and tapped into place from the patient's left side, as the right L5 screw did appear on imaging to have breached the L5 endplate.  The cage was then expanded.  Lateral fluoroscopy confirmed good location of the implant and good endplate apposition.    At this point using combination of standard anatomic landmarks as well as the assistance of lateral fluoroscopy, standard trajectory pedicle screws were drilled and  tapped bilaterally at L4.   6.5 x 45 mm screws were then placed at L4.    New 6.5 x 50 mm screws were then placed at L5.  I did not feel that replacement of the S1 screws would be feasible given the large length of broken screw shank within the S1 pedicles bilaterally.  An 80 mm lordotic prebent rod was then placed into the iliac screws and into the screws at L4 and L5.  Set screws were then placed and final tightened.  Final AP and lateral fluoroscopic images were taken revealing good position of the implanted hardware and good lumbar alignment.    At this point the wound was irrigated with normal saline.  The muscle was reapproximated with interrupted 0 Vicryl stitches.  The fascia was closed tightly with interrupted 0 Vicryl stitches, the deep subcutaneous layer was reapproximated with interrupted 0 Vicryl stitches and the skin was closed with interrupted subcuticular three 0 Vicryl.  A layer of Dermabond was applied.  Sterile dressing was then placed.   The patient's pain was removed and the stab incisions were closed with interrupted three Vicryl and Dermabond was applied also.  Patient was then transferred to the stretcher, extubated, and taken to the postanesthesia care unit in stable hemodynamic condition.  At the end of the case all sponge, needle, instrument, and cottonoid counts were correct.   Lisbeth Renshaw, MD [facility]

## 2020-10-17 ENCOUNTER — Ambulatory Visit (INDEPENDENT_AMBULATORY_CARE_PROVIDER_SITE_OTHER): Payer: PPO

## 2020-10-17 DIAGNOSIS — I255 Ischemic cardiomyopathy: Secondary | ICD-10-CM

## 2020-10-17 LAB — CUP PACEART REMOTE DEVICE CHECK
Battery Remaining Longevity: 19 mo
Battery Voltage: 2.93 V
Brady Statistic AP VP Percent: 0.27 %
Brady Statistic AP VS Percent: 0.02 %
Brady Statistic AS VP Percent: 99.42 %
Brady Statistic AS VS Percent: 0.29 %
Brady Statistic RA Percent Paced: 0.28 %
Brady Statistic RV Percent Paced: 97.37 %
Date Time Interrogation Session: 20221013033523
HighPow Impedance: 76 Ohm
Implantable Lead Implant Date: 20190610
Implantable Lead Implant Date: 20200306
Implantable Lead Implant Date: 20200306
Implantable Lead Location: 753858
Implantable Lead Location: 753859
Implantable Lead Location: 753860
Implantable Lead Model: 5071
Implantable Lead Model: 5076
Implantable Pulse Generator Implant Date: 20200306
Lead Channel Impedance Value: 285 Ohm
Lead Channel Impedance Value: 380 Ohm
Lead Channel Impedance Value: 4047 Ohm
Lead Channel Impedance Value: 4047 Ohm
Lead Channel Impedance Value: 437 Ohm
Lead Channel Impedance Value: 456 Ohm
Lead Channel Pacing Threshold Amplitude: 0.625 V
Lead Channel Pacing Threshold Amplitude: 0.75 V
Lead Channel Pacing Threshold Amplitude: 1.875 V
Lead Channel Pacing Threshold Pulse Width: 0.4 ms
Lead Channel Pacing Threshold Pulse Width: 0.4 ms
Lead Channel Pacing Threshold Pulse Width: 1 ms
Lead Channel Sensing Intrinsic Amplitude: 31.625 mV
Lead Channel Sensing Intrinsic Amplitude: 31.625 mV
Lead Channel Sensing Intrinsic Amplitude: 4.125 mV
Lead Channel Sensing Intrinsic Amplitude: 4.125 mV
Lead Channel Setting Pacing Amplitude: 1.5 V
Lead Channel Setting Pacing Amplitude: 2.5 V
Lead Channel Setting Pacing Amplitude: 3 V
Lead Channel Setting Pacing Pulse Width: 0.4 ms
Lead Channel Setting Pacing Pulse Width: 1 ms
Lead Channel Setting Sensing Sensitivity: 0.6 mV

## 2020-10-23 ENCOUNTER — Telehealth: Payer: Self-pay | Admitting: Medical

## 2020-10-23 NOTE — Progress Notes (Signed)
  Chronic Care Management   Note  10/23/2020 Name: [name] MRN: [id number] DOB: Jan 07, 1955  [name] is a 65 y.o. year old male who is a primary care patient of Aleen Campi Cleda Mccreedy. I reached out to [name] by phone today in response to a referral sent by [name] [name]'s PCP, Tysinger, Kermit Balo, PA-C.   [name] was given information about Chronic Care Management services today including:  CCM service includes personalized support from designated clinical staff supervised by his physician, including individualized plan of care and coordination with other care providers 24/7 contact phone numbers for assistance for urgent and routine care needs. Service will only be billed when office clinical staff spend 20 minutes or more in a month to coordinate care. Only one practitioner may furnish and bill the service in a calendar month. The patient may stop CCM services at any time (effective at the end of the month) by phone call to the office staff.   Patient agreed to services and verbal consent obtained.   Follow up plan:   [name] manager, [employer]

## 2020-10-23 NOTE — Chronic Care Management (AMB) (Signed)
  Chronic Care Management   Outreach Note  10/23/2020 Name: [name] MRN: [id number] DOB: 1955/08/18  Referred by: [name], PA-C Reason for referral : No chief complaint on file.   An unsuccessful telephone outreach was attempted today. The patient was referred to the pharmacist for assistance with care management and care coordination.   Follow Up Plan:  [name] Dellinger Upstream Scheduler

## 2020-10-25 NOTE — Progress Notes (Signed)
Remote ICD transmission.   

## 2020-10-28 DIAGNOSIS — M9983 Other biomechanical lesions of lumbar region: Secondary | ICD-10-CM | POA: Diagnosis not present

## 2020-11-01 ENCOUNTER — Encounter (HOSPITAL_COMMUNITY): Payer: Self-pay | Admitting: Neurosurgery

## 2020-11-13 NOTE — Progress Notes (Signed)
Electrophysiology Office Note Date: 11/14/2020  ID:  [name], [name] 12-23-1955, MRN [id number]  PCP: [name], PA-C Primary Cardiologist: Peter Swaziland, MD Electrophysiologist: Sherryl Manges, MD   CC: Routine ICD follow-up  [name] is a 65 y.o. male seen today for Sherryl Manges, MD for routine electrophysiology followup.  Since last being seen in our clinic doing well overall. Cath 09/2020 with medical management only.   Today, he has no acute complaints. His energy level is still below where he wants it. He is however, able to walk 3-5 miles a day. He does have some fatigue before and after. He is overall feeling somewhat better since back surgery in September.  He denies symptoms of palpitations, chest pain, shortness of breath, orthopnea, PND, lower extremity edema, claudication, dizziness, presyncope, syncope, bleeding, or neurologic sequela. The patient is tolerating medications without difficulties.    Device History: MDT CRT-D, implanted 03/11/18, ICM w/syncope  Past Medical History:  Diagnosis Date   AICD (automatic cardioverter/defibrillator) present    Anxiety    Arthritis    "mild; back, neck, spine" (06/09/2017)   CHF (congestive heart failure) (HCC)    Chronic back pain    Coronary artery disease    Dyspnea    GERD (gastroesophageal reflux disease)    History of gout    History of kidney stones X 2   Hypercholesterolemia    Hypertension    LBBB (left bundle branch block)    MI (myocardial infarction) (HCC) 1993   LATERAL   MI (myocardial infarction) (HCC) ?09/2001; 06/07/2017   Pneumonia    "several times" (06/09/2017)   S/P coronary artery stent placement    RCA   Varicose veins    Past Surgical History:  Procedure Laterality Date   ANTERIOR CERVICAL DECOMP/DISCECTOMY FUSION  03/03/2011   Procedure: ANTERIOR CERVICAL DECOMPRESSION/DISCECTOMY FUSION 3 LEVELS;  Surgeon: Karn Cassis, MD;  Location: [facility] ORS;  Service: Neurosurgery;   Laterality: N/A;  Cervical three-four Cervical four-five Cervical five-six Anterior cervical decompression/diskectomy, fusion   APPLICATION OF ROBOTIC ASSISTANCE FOR SPINAL PROCEDURE N/A 10/03/2020   Procedure: APPLICATION OF ROBOTIC ASSISTANCE FOR SPINAL PROCEDURE;  Surgeon: Lisbeth Renshaw, MD;  Location: [facility] OR;  Service: Neurosurgery;  Laterality: N/A;   BACK SURGERY     CARDIAC CATHETERIZATION  2009   Stents in RCA patent. 70 to 80% PL, and 50% ostial PD.    CARDIAC CATHETERIZATION N/A 11/20/2014   Procedure: Left Heart Cath and Coronary Angiography;  Surgeon: Peter M Swaziland, MD;  Location: [facility] INVASIVE CV LAB;  Service: Cardiovascular;  Laterality: N/A;   CORONARY ANGIOPLASTY     DIRECT ANGIOPLASTY THE MARGINAL BRANCH   CORONARY ANGIOPLASTY WITH STENT PLACEMENT     RCA   CORONARY ARTERY BYPASS GRAFT N/A 06/14/2017   Procedure: CORONARY ARTERY BYPASS GRAFTING (CABG) x four, using left internal mammary artery and right leg greater saphenous vein harvested endoscopically;  Surgeon: Kerin Perna, MD;  Location: [name] OR;  Service: Open Heart Surgery;  Laterality: N/A;   CYSTOSCOPY N/A 06/14/2017   Procedure: CYSTOSCOPY;  Surgeon: Kerin Perna, MD;  Location: [facility] OR;  Service: Open Heart Surgery;  Laterality: N/A;   ICD IMPLANT N/A 03/11/2018   Procedure: ICD IMPLANT - Dual Chamber;  Surgeon: [name], MD;  Location: [facility] INVASIVE CV LAB;  Service: Cardiovascular;  Laterality: N/A;   INSERTION OF SUPRAPUBIC CATHETER N/A 06/14/2017   Procedure: INSERTION OF SUPRAPUBIC CATHETER - Lower abdomen;  Surgeon:  Kerin Perna, MD;  Location: [facility] OR;  Service: Open Heart Surgery;  Laterality: N/A;   POSTERIOR LUMBAR FUSION  10/2011   RIGHT/LEFT HEART CATH AND CORONARY ANGIOGRAPHY N/A 06/08/2017   Procedure: RIGHT/LEFT HEART CATH AND CORONARY ANGIOGRAPHY;  Surgeon: Swaziland, Peter M, MD;  Location: [facility] INVASIVE CV LAB;  Service: Cardiovascular;  Laterality: N/A;   RIGHT/LEFT HEART CATH AND  CORONARY/GRAFT ANGIOGRAPHY N/A 09/06/2020   Procedure: RIGHT/LEFT HEART CATH AND CORONARY/GRAFT ANGIOGRAPHY;  Surgeon: Swaziland, Peter M, MD;  Location: [facility] INVASIVE CV LAB;  Service: Cardiovascular;  Laterality: N/A;   TEE WITHOUT CARDIOVERSION N/A 06/14/2017   Procedure: TRANSESOPHAGEAL ECHOCARDIOGRAM (TEE);  Surgeon: Donata Clay, Theron Arista, MD;  Location: [facility] OR;  Service: Open Heart Surgery;  Laterality: N/A;   URETHROPLASTY N/A 10/15/2017   Procedure: URETHROPLASTY WITH BUCCAL GRAFT HARVAST;  Surgeon: Crist Fat, MD;  Location: [facility] ORS;  Service: Urology;  Laterality: N/A;    Current Outpatient Medications  Medication Sig Dispense Refill   acetaminophen (TYLENOL) 500 MG tablet Take 1,000 mg by mouth in the morning and at bedtime. Morning & noon     aspirin 81 MG EC tablet Take 81 mg by mouth in the morning.     carvedilol (COREG) 6.25 MG tablet Take 0.5 tablets (3.125 mg total) by mouth 2 (two) times daily with a meal. Must have office visit for additional refills [phone] 60 tablet 0   ENTRESTO 24-26 MG TAKE 1/2 TABLET BY MOUTH 2 TIMES DAILY 90 tablet 2   esomeprazole (NEXIUM) 20 MG capsule TAKE 1 CAPSULE BY MOUTH EVERY DAY 90 capsule 2   furosemide (LASIX) 20 MG tablet Take 1 tablet (20 mg total) by mouth as needed. 30 tablet 3   Glucosamine-Chondroitin (OSTEO BI-FLEX REGULAR STRENGTH PO) Take 1 tablet by mouth 2 (two) times daily. Morning & noon     Multiple Vitamin (MULTIVITAMIN WITH MINERALS) TABS tablet Take 1 tablet by mouth in the morning and at bedtime. [employer] Mega [employer]     nitroGLYCERIN (NITROSTAT) 0.4 MG SL tablet Place 1 tablet (0.4 mg total) under the tongue every 5 (five) minutes as needed for chest pain. 90 tablet 3   REPATHA SURECLICK 140 MG/ML SOAJ INJECT INTO SKIN EVERY 14 DAYS 2 mL 11   zolpidem (AMBIEN) 10 MG tablet Take 1 tablet (10 mg total) by mouth at bedtime. 30 tablet 2   isosorbide mononitrate (IMDUR) 30 MG 24 hr tablet Take 30 mg by mouth daily. (Patient not taking:  Reported on 11/14/2020)     No current facility-administered medications for this visit.    Allergies:   Ezetimibe, Niacin, and Statins   Social History: Social History   Socioeconomic History   Marital status: Married    Spouse name: Not on file   Number of children: 1   Years of education: Not on file   Highest education level: Not on file  Occupational History   Occupation: Truck Airline pilot  Tobacco Use   Smoking status: Former    Packs/day: 2.00    Years: 20.00    Pack years: 40.00    Types: Cigarettes    Quit date: 05/07/1999    Years since quitting: 21.5   Smokeless tobacco: Never  Vaping Use   Vaping Use: Never used  Substance and Sexual Activity   Alcohol use: Not Currently    Alcohol/week: 1.0 standard drink    Types: 1 Cans of beer per week   Drug use: Never   Sexual activity: Not on file  Other Topics Concern   Not on file  Social History Narrative   Married, works in Airline pilot for large trucks.   11/2019.   Social Determinants of Health   Financial Resource Strain: Not on file  Food Insecurity: Not on file  Transportation Needs: Not on file  Physical Activity: Not on file  Stress: Not on file  Social Connections: Not on file  Intimate Partner Violence: Not on file    Family History: Family History  Problem Relation Age of Onset   Heart attack Father    Diabetes Father    Breast cancer Mother    Coronary artery disease Mother    Heart disease Brother    Heart attack Maternal Grandfather     Review of Systems: Review of systems complete and found to be negative unless listed in HPI.     Physical Exam: Vitals:   11/14/20 0933  BP: 120/68  Pulse: 95  SpO2: 99%  Weight: 228 lb (103.4 kg)  Height: 6\' 2"  (1.88 m)     General:  Well appearing. No resp difficulty. HEENT: Normal Neck: Supple. JVP 5-6. Carotids 2+ bilat; no bruits. No thyromegaly or nodule noted. Cor: PMI nondisplaced. RRR, No M/G/R noted Lungs: CTAB, normal effort. Abdomen:  Soft, non-tender, non-distended, no HSM. No bruits or masses. +BS   Extremities: No cyanosis, clubbing, or rash. R and LLE no edema.  Neuro: Alert & orientedx3, cranial nerves grossly intact. moves all 4 extremities w/o difficulty. Affect pleasant   ICD interrogation- reviewed in detail today,  See PACEART report  EKG:  EKG is not ordered today.   Recent Labs: 07/31/2020: B Natriuretic Peptide 224.0 08/30/2020: ALT 21 09/03/2020: TSH 1.830 10/04/2020: BUN 20; Creatinine, Ser 0.89; Hemoglobin 11.9; Platelets 205; Potassium 4.6; Sodium 135   Wt Readings from Last 3 Encounters:  11/14/20 228 lb (103.4 kg)  10/03/20 226 lb (102.5 kg)  09/24/20 225 lb (102.1 kg)     Other studies Reviewed: Additional studies/ records that were reviewed today include: Previous EP office notes, gen cards notes, ED notes. Most recent CXR.    Assessment and Plan:  1.  Chronic systolic dysfunction s/p [employer] CRT-D  With interval improval of EF Echo 08/30/20 40%, he is currently not candidate for Barostim consideration.  NYHA II-III symptoms, fatigue is his primarily complaint Volume status stable on exam.  Stable on an appropriate medical regimen Normal ICD function See Pace Art report No changes today  2. ICM s/p CABG x 4 R/LHC 09/06/2020 with severe native disease and patent grafts. Continue medical management On ASA, Repatha Continue imdur 30 mg daily and prn NTG  Current medicines are reviewed at length with the patient today.    Labs/ tests ordered today include:  Orders Placed This Encounter  Procedures   Basic metabolic panel   CBC    Disposition: RTC 6 months with Dr. 11/06/2020  Signed, Graciela Husbands, PA-C  11/14/2020 11:24 AM  [facility] [address] [place] [phone] (office) [phone] (fax)

## 2020-11-14 ENCOUNTER — Ambulatory Visit: Payer: PPO | Admitting: Student

## 2020-11-14 ENCOUNTER — Telehealth: Payer: Self-pay | Admitting: Internal Medicine

## 2020-11-14 ENCOUNTER — Other Ambulatory Visit: Payer: Self-pay

## 2020-11-14 ENCOUNTER — Encounter: Payer: Self-pay | Admitting: Student

## 2020-11-14 VITALS — BP 120/68 | HR 95 | Ht 74.0 in | Wt 228.0 lb

## 2020-11-14 DIAGNOSIS — I447 Left bundle-branch block, unspecified: Secondary | ICD-10-CM

## 2020-11-14 DIAGNOSIS — I5022 Chronic systolic (congestive) heart failure: Secondary | ICD-10-CM | POA: Diagnosis not present

## 2020-11-14 DIAGNOSIS — I25708 Atherosclerosis of coronary artery bypass graft(s), unspecified, with other forms of angina pectoris: Secondary | ICD-10-CM | POA: Diagnosis not present

## 2020-11-14 DIAGNOSIS — I255 Ischemic cardiomyopathy: Secondary | ICD-10-CM

## 2020-11-14 DIAGNOSIS — I209 Angina pectoris, unspecified: Secondary | ICD-10-CM

## 2020-11-14 LAB — CBC
Hematocrit: 42.7 % (ref 37.5–51.0)
Hemoglobin: 14 g/dL (ref 13.0–17.7)
MCH: 31.9 pg (ref 26.6–33.0)
MCHC: 32.8 g/dL (ref 31.5–35.7)
MCV: 97 fL (ref 79–97)
Platelets: 224 10*3/uL (ref 150–450)
RBC: 4.39 x10E6/uL (ref 4.14–5.80)
RDW: 12.5 % (ref 11.6–15.4)
WBC: 5.3 10*3/uL (ref 3.4–10.8)

## 2020-11-14 LAB — CUP PACEART INCLINIC DEVICE CHECK
Battery Remaining Longevity: 18 mo
Battery Voltage: 2.93 V
Brady Statistic AP VP Percent: 0.14 %
Brady Statistic AP VS Percent: 0.03 %
Brady Statistic AS VP Percent: 99.36 %
Brady Statistic AS VS Percent: 0.47 %
Brady Statistic RA Percent Paced: 0.16 %
Brady Statistic RV Percent Paced: 97.65 %
Date Time Interrogation Session: 20221110112446
HighPow Impedance: 81 Ohm
Implantable Lead Implant Date: 20190610
Implantable Lead Implant Date: 20200306
Implantable Lead Implant Date: 20200306
Implantable Lead Location: 753858
Implantable Lead Location: 753859
Implantable Lead Location: 753860
Implantable Lead Model: 5071
Implantable Lead Model: 5076
Implantable Pulse Generator Implant Date: 20200306
Lead Channel Impedance Value: 323 Ohm
Lead Channel Impedance Value: 4047 Ohm
Lead Channel Impedance Value: 4047 Ohm
Lead Channel Impedance Value: 437 Ohm
Lead Channel Impedance Value: 437 Ohm
Lead Channel Impedance Value: 494 Ohm
Lead Channel Pacing Threshold Amplitude: 0.625 V
Lead Channel Pacing Threshold Amplitude: 0.75 V
Lead Channel Pacing Threshold Amplitude: 1.875 V
Lead Channel Pacing Threshold Pulse Width: 0.4 ms
Lead Channel Pacing Threshold Pulse Width: 0.4 ms
Lead Channel Pacing Threshold Pulse Width: 1 ms
Lead Channel Sensing Intrinsic Amplitude: 31.625 mV
Lead Channel Sensing Intrinsic Amplitude: 31.625 mV
Lead Channel Sensing Intrinsic Amplitude: 4 mV
Lead Channel Sensing Intrinsic Amplitude: 5 mV
Lead Channel Setting Pacing Amplitude: 1.5 V
Lead Channel Setting Pacing Amplitude: 2.5 V
Lead Channel Setting Pacing Amplitude: 3 V
Lead Channel Setting Pacing Pulse Width: 0.4 ms
Lead Channel Setting Pacing Pulse Width: 1 ms
Lead Channel Setting Sensing Sensitivity: 0.6 mV

## 2020-11-14 LAB — BASIC METABOLIC PANEL
BUN/Creatinine Ratio: 16 (ref 10–24)
BUN: 16 mg/dL (ref 8–27)
CO2: 27 mmol/L (ref 20–29)
Calcium: 9.8 mg/dL (ref 8.6–10.2)
Chloride: 101 mmol/L (ref 96–106)
Creatinine, Ser: 0.99 mg/dL (ref 0.76–1.27)
Glucose: 128 mg/dL — ABNORMAL HIGH (ref 70–99)
Potassium: 4.7 mmol/L (ref 3.5–5.2)
Sodium: 140 mmol/L (ref 134–144)
eGFR: 84 mL/min/{1.73_m2} (ref >59)

## 2020-11-14 NOTE — Patient Instructions (Signed)
Medication Instructions:  Your physician recommends that you continue on your current medications as directed. Please refer to the Current Medication list given to you today.  *If you need a refill on your cardiac medications before your next appointment, please call your pharmacy*   Lab Work: TODAY: BMET, CBC   If you have labs (blood work) drawn today and your tests are completely normal, you will receive your results only by: MyChart Message (if you have MyChart) OR A paper copy in the mail If you have any lab test that is abnormal or we need to change your treatment, we will call you to review the results.   Follow-Up: At [facility], you and your health needs are our priority.  As part of our continuing mission to provide you with exceptional heart care, we have created designated Provider Care Teams.  These Care Teams include your primary Cardiologist (physician) and Advanced Practice Providers (APPs -  Physician Assistants and Nurse Practitioners) who all work together to provide you with the care you need, when you need it.  We recommend signing up for the patient portal called "MyChart".  Sign up information is provided on this After Visit Summary.  MyChart is used to connect with patients for Virtual Visits (Telemedicine).  Patients are able to view lab/test results, encounter notes, upcoming appointments, etc.  Non-urgent messages can be sent to your provider as well.   To learn more about what you can do with MyChart, go to [email or web address].    Your next appointment:   6 month(s)  The format for your next appointment:   In Person  Provider:   You may see Steven Klein, MD or one of the following Advanced Practice Providers on your designated Care Team:   Renee Ursuy, PA-C Michael "Andy" Tillery, PA-C    

## 2020-11-14 NOTE — Telephone Encounter (Signed)
Pt is due for his pneumonia shot and was going to have it done after his caterization. Which pneumonia shot does he need to have. I called and left a message for him to call back to schedule this

## 2020-11-15 NOTE — Telephone Encounter (Signed)
Pt coming in dec 7th

## 2020-11-15 NOTE — Telephone Encounter (Signed)
Left message for pt to call back  °

## 2020-11-20 ENCOUNTER — Ambulatory Visit: Payer: PPO | Admitting: Sports Medicine

## 2020-11-20 VITALS — BP 120/70 | Ht 74.0 in | Wt 226.0 lb

## 2020-11-20 DIAGNOSIS — G8929 Other chronic pain: Secondary | ICD-10-CM

## 2020-11-20 DIAGNOSIS — M1711 Unilateral primary osteoarthritis, right knee: Secondary | ICD-10-CM | POA: Diagnosis not present

## 2020-11-20 DIAGNOSIS — M25551 Pain in right hip: Secondary | ICD-10-CM | POA: Diagnosis not present

## 2020-11-20 DIAGNOSIS — M25561 Pain in right knee: Secondary | ICD-10-CM | POA: Diagnosis not present

## 2020-11-20 DIAGNOSIS — M545 Low back pain, unspecified: Secondary | ICD-10-CM | POA: Diagnosis not present

## 2020-11-20 MED ORDER — METHYLPREDNISOLONE ACETATE 40 MG/ML IJ SUSP
40.0000 mg | Freq: Once | INTRAMUSCULAR | Status: AC
  Administered 2020-11-20: 40 mg via INTRA_ARTICULAR

## 2020-11-20 NOTE — Progress Notes (Signed)
PCP: [name], PA-C  Subjective:   HPI: Patient is a 65 y.o. male here for evaluation of right knee pain and right lateral hip pain.  [name] presents today for 2 issues, the first being his acute on chronic right knee pain.  He does have a history of knee arthritis within the patellofemoral joint and some medial joint space narrowing.  He has done some home exercises in the past, and states about 1 year ago he had a cortisone injection which lasted many months until recently it has started to act back up again.  He denies any new injury to it.  He gets pain both on the medial and lateral joint line, some crepitus with bending and flexing the knee.  He recently had a spinal surgery performed by [facility] about 5 weeks ago, and has started to get back into physical activity such as stationary bike and walking and this has slightly exacerbated the knee.  In addition to the knee, he has some pain over the right lateral hip, he points to the greater trochanter area.  Previously to his back surgery he was mostly recumbent, and then as he has begun getting back into physical activity it has been bothering him.  He has point tenderness over the lateral hip.  He feels a pulling sensation at times when he is walking up and down hills.  He denies any redness, erythema or swelling to the area.  He occasionally take over-the-counter anti-inflammatories, but does not take anything consistently for the pain.   BP 120/70   Ht 6\' 2"  (1.88 m)   Wt 226 lb (102.5 kg)   BMI 29.02 kg/m   [facility] 11/20/2020  Frequency of aerobic exercise (# of days/week) 5  Average time in minutes 20  Frequency of strengthening activities (# of days/week) 0    No flowsheet data found.      Objective:  Physical Exam:  Gen: Well-appearing, in no acute distress; non-toxic CV: Regular Rate. Well-perfused. Warm.  Resp: Breathing unlabored on room air; no wheezing. Psych: Fluid  speech in conversation; appropriate affect; normal thought process Neuro: Sensation intact throughout. No gross coordination deficits.  MSK:  - Right knee: There is some TTP over the medial joint line.  Inspection yields no significant effusion, erythema or ecchymosis.  There is no warmth noted to the joint.  Range of motion from 0-135 degrees without pain or difficulty.  There is no instability varus or valgus stress at 0 and 30 degrees.  Negative Lachman, anterior/posterior drawer.  Strength 5/5 with knee flexion and extension.  Neurovascular intact distally.  - Right hip: + There is exquisite TTP over the greater trochanter.  No significant TTP palpating down the IT band.  No SI joint TTP.  Range of motion full about the hip, although some tenderness in the back with full flexion.  There is notable diminished strength with lateral abduction of the right hip compared to the contralateral left hip.  Otherwise strength 5/5 of bilateral lower extremities.  Negative FABER, negative FADIR test.  - Lumbar spine: Previous surgical incision down the lumbar spine well-healed, CDI.     Assessment & Plan:  1. Right knee pain - acute on chronic with evidence of PF joint OA and knee OA with medial joint space narrowing.  2.  Right greater trochanteric pain syndrome -with TTP over the greater trochanter and weakness with hip abduction. 3. Chronic LBP with prior screw and rod fixation at L5-S1  with prior fracture. Recent surgical correction - follows with [facility].  Procedure: After informed written consent timeout was performed, patient was seated on exam table. The patient's right knee was prepped with Betadine and alcohol swab and utilizing anteromedial approach, the patient's knee was injected intraarticularly with 4:2 lidocaine: depomedrol. Patient tolerated the procedure well without immediate complications.  Plan: -Discussed the patient's clinical conditions and walk-through all treatment options.  I  do think some of this is stemming from his recovery from his back surgery.  Through shared decision making, elected to undergo cortisone injection into the right knee today, patient tolerated well. -Recommend continue light-moderate aerobic activity, strengthening the quadricep muscles above the knee -Home PT with exercises provided and demonstrated for the patient today for his greater trochanteric pain syndrome -He may use ice/heat to the area, may use Tylenol as well for the hip or for any postinjection pain of the knee -We will follow-up in about 4 weeks to reevaluate the knee and the hip at that time to see if he is having improvement with home exercises   [name], DO PGY-4, Sports Medicine Fellow [facility]  Addendum:  I was the preceptor for this visit and available for immediate consultation.  Norton Blizzard MD Marrianne Mood

## 2020-12-11 ENCOUNTER — Other Ambulatory Visit: Payer: Self-pay

## 2020-12-11 ENCOUNTER — Other Ambulatory Visit (INDEPENDENT_AMBULATORY_CARE_PROVIDER_SITE_OTHER): Payer: PPO

## 2020-12-11 ENCOUNTER — Other Ambulatory Visit: Payer: Self-pay | Admitting: Cardiology

## 2020-12-11 DIAGNOSIS — Z23 Encounter for immunization: Secondary | ICD-10-CM | POA: Diagnosis not present

## 2020-12-18 ENCOUNTER — Ambulatory Visit: Payer: Self-pay

## 2020-12-18 ENCOUNTER — Ambulatory Visit: Payer: PPO | Admitting: Sports Medicine

## 2020-12-18 VITALS — BP 132/86 | Ht 74.0 in | Wt 228.0 lb

## 2020-12-18 DIAGNOSIS — M25551 Pain in right hip: Secondary | ICD-10-CM

## 2020-12-18 DIAGNOSIS — M1711 Unilateral primary osteoarthritis, right knee: Secondary | ICD-10-CM

## 2020-12-18 DIAGNOSIS — G8929 Other chronic pain: Secondary | ICD-10-CM

## 2020-12-18 DIAGNOSIS — M25561 Pain in right knee: Secondary | ICD-10-CM | POA: Diagnosis not present

## 2020-12-18 MED ORDER — CELECOXIB 100 MG PO CAPS: 100.0000 mg | ORAL_CAPSULE | Freq: Two times a day (BID) | ORAL | 0 refills | Status: DC

## 2020-12-18 MED ORDER — CELECOXIB 100 MG PO CAPS: 100.0000 mg | ORAL_CAPSULE | Freq: Every day | ORAL | 0 refills | Status: DC

## 2020-12-18 MED ORDER — METHYLPREDNISOLONE ACETATE 40 MG/ML IJ SUSP
40.0000 mg | Freq: Once | INTRAMUSCULAR | Status: AC
  Administered 2020-12-18: 11:00:00 40 mg via INTRA_ARTICULAR

## 2020-12-18 NOTE — Patient Instructions (Signed)
It was great to see you today, thank you for letting me participate in your care!  Today, we discussed your right hip pain and right knee pain.  For the hip: -Pain should be improved after your cortisone injection -Recommend taking it easy with the hip exercises for the next 2 days, you may use ice and the Celebrex for any postinjection pain -By Friday you may resume your hip exercises both at home and with low weight at the gym  For the knee:  -This seems most indicative of a flareup of your osteoarthritis, your medial meniscus looks pretty good on ultrasound -We will do a trial of Celebrex 100 mg (1 tablet) to be taken once or twice daily.  Take this with food, to try to avoid GI upset. -Continue to ice the knee -Begin doing some at home exercises for the knee such as straight leg raise orders of exercises as attached  You will follow-up in about 7 weeks to follow-up for the knee and hip. If your pain gets a lot worse in between then, please give [place] a call so we can discuss options.  If you have any further questions, please give the clinic a call [phone].  Cheers,  [name], DO [facility]

## 2020-12-18 NOTE — Progress Notes (Signed)
PCP: [name], PA-C  Subjective:   HPI: Patient is a 65 y.o. male here for follow-up of right hip pain and right knee pain.  R-hip -patient was previously seen on 11/20/2020 by myself and noted to have pain over the right greater trochanter.  He did have some notable weakness with hip abduction at that time.  He has been doing at home hip abduction exercises every other day, and doing some hip abduction exercises at the gym as well.  His pain has diminished slightly, although still lingers from a 5-08/2008 throughout the day.  It is keeping him up at night when he is trying to lay on that side, which is bothersome to him.  Denies any radicular pain going down the leg.  R-knee -acute on chronic right knee pain.  He was seen 1 month ago and had a cortisone injection into the right knee, which states gave him great relief.  However about 2 weeks ago he was swimming and walking [place] the beach and felt like he stepped funny and was trying to fight off the waves and felt a twinge in the inner part of the medial knee.  He did note some swelling but has been applying ice.  Denies any instability about the knee or giving out.  The swelling has improved with ice and he has been alternating between Aleve and Tylenol.  BP 132/86    Ht 6\' 2"  (1.88 m)    Wt 228 lb (103.4 kg)    BMI 29.27 kg/m   [facility] 11/20/2020  Frequency of aerobic exercise (# of days/week) 5  Average time in minutes 20  Frequency of strengthening activities (# of days/week) 0    No flowsheet data found.      Objective:  Physical Exam:  Gen: Well-appearing, in no acute distress; non-toxic CV: Regular Rate. Well-perfused. Warm.  Resp: Breathing unlabored on room air; no wheezing. Psych: Fluid speech in conversation; appropriate affect; normal thought process Neuro: Sensation intact throughout. No gross coordination deficits.  MSK:   - Right knee: There is mild TTP over the  medial joint line.  There is very trace effusion knee in the suprapatellar pouch.  There is no warmth noted to the joint.  No erythema or ecchymosis.  Range of motion is preserved from 0-130 degrees, although some pain with endrange extension.  There is no varus or valgus instability.  Negative Lachman, anterior/posterior drawer.  Strength 5/5 in all directions.  Neurovascular intact distally   - Right hip: + There is TTP over the greater trochanter. No SI joint TTP.  Range of motion full about the hip. There is improved strength with lateral abduction of the right hip compared to last visit although still slightly weaker than left hip abduction.  Otherwise strength 5/5 of bilateral lower extremities.   Assessment & Plan:  1. Acute on chronic right knee pain - likely exacerbation of underlying OA. Bedside U/S did not demonstrate any gross tearing of medial meniscus.  2. Right greater trochanteric bursitis  Limited U/S exam, Right lateral hip: -Identification of the greater trochanter was evaluated in short and long axis.  There was a small hypoechoic bursal distention of the joint.  There is also small calcific changes within the gluteus medius overlying tendon.  No evidence of gross tearing.  Procedure: After informed written consent timeout was performed, patient was lying in lateral recumbent position on exam table. Using ultrasound guidance, the greater trochanter was  identified. The area overlying right trochanteric bursa was then prepped with Betadine and alcohol swabs. Following sterile precautions, ultrasound was reapplied to visualize needle guidance with a 25-gauge 3.5" needle utilizing an in-plane approach to inject the bursa with 4:1 lidocaine: depomedrol. Delivery of the injectate was visualized into the region of hypoechoic fluid of the greater trochanteric bursa. Patient tolerated procedure well without immediate complications.    Plan: -Walk-through all treatment options today, through  shared decision making elected to undergo ultrasound-guided greater trochanteric CS injection today, tolerated well. -May use ice and or heat for any postinjection pain -We will treat with RICE therapy for the knee, rehab exercises given for the knee including quad and VMO strengthening.  -Did provide a short course of Celebrex to be taken 100 mg once daily as needed for breakthrough pain, counseled on risk/benefits/indications and not taking any over-the-counter anti-inflammatories with this medicine -We will follow-up in about 6-8 weeks, may call if any issues arise prior to this  [name], DO PGY-4, Sports Medicine Fellow [facility]  Addendum:  I was the preceptor for this visit and available for immediate consultation.  Norton Blizzard MD Marrianne Mood

## 2020-12-19 ENCOUNTER — Other Ambulatory Visit: Payer: Self-pay | Admitting: Medical

## 2021-01-07 ENCOUNTER — Telehealth: Payer: Self-pay | Admitting: Pharmacist

## 2021-01-07 NOTE — Chronic Care Management (AMB) (Signed)
Chronic Care Management Pharmacy Assistant   Name: [name]  MRN: [id number] DOB: 07-16-1955  Reason for Encounter: Chart review for Initial Encounter with [name] Clinical pharmacist at 11 am via phone call    Conditions to be addressed/monitored: CHF, CAD, HTN, Anxiety, and Hypercholesterolemia  Recent office visits:  09/03/20 [name], PA-C - Patient presented for Encounter for health maintenance examination and other concerns.  Recent consult visits:  12/18/20 [name], DO (Sports Med) - Patient presented for Greater trochanteric pain syndrome of right lower extremity and other concerns. Prescribed Celecoxib 100 mg.   11/20/20  [name], DO (Sports Med) - Patient presented for primary osteoarthritis of right knee and other concerns. No medication changes.  11/14/20 Graciella Freer, PA-C (Cardiology) - Patient presented for Ischemic cardiomyopathy and other concerns. Stopped Empagliflozin, Stopped Hydrocodone - Acetaminophen, Stopped Methocarbamol, Stopped OTC bedtime supplements  10/28/20 Lisbeth Renshaw (Neurosurgery) - Patient presented for biomechanical lesions of lumbar region. No other visit details available.  10/17/20 [name], RN (Cardiology) - Patient presented for Ischemic cardiomyopathy. No medication changes.  09/06/20 [place], Peter M, MD (Cardiology) - Patient presented for RIGHT/LEFT HEART CATH AND CORONARY/GRAFT ANGIOGRAPHY Started Jardiance Stopped Isosorbide Monitrate  09/01/20 [place], Peter M, MD (Cardiology) - Patient presented for  Acute chronic and systolic/diastolic CHF and other concerns. Stopped Rifaximin 550 mg.  08/28/20 Lisbeth Renshaw (Neurosurgery) - Patient presented for disc degeneration and other concerns. No other visit details available.  08/26/20 [facility] - Patient presented for Knee pain and other concerns. No medication changes.  08/26/20 Renaldo Fiddler  (Anesthesiology) - Patient presented for back hip and knee pain. No other visit details available.  08/14/20 Graciella Freer, PA-C (Cardiology) - Patient presented for Ischemic cardiomyopathy and other concerns. Prescribed Furosemide 20 mg, Isosorbide Mononitrate 30 mg, Nitroglycerin 0.4 mg. Stopped Eluxadoline 75 mg, Fenofibrate 145 mg and Sulfamethoxazole - Trimethoprim 800 mg   07/23/20 Tyrell Antonio (Anesthesiology) - Patient presented for back hip and knee pain. No other visit details available.  07/18/20 [name], RN (Cardiology) - Patient presented for Ischemic cardiomyopathy. No medication changes.  07/18/20 [name] (Cardiology) - Patient presented for Ischemic cardiomyopathy. No other visit details available.  Hospital visits:  Medication Reconciliation was completed by comparing discharge summary, patients EMR and Pharmacy list, and upon discussion with patient.  Patient presented to [facility] on 10/03/20 due to Elective Surgery. Patient was there for 3 days.  New?Medications Started at [facility] Discharge:?? -started  HYDROcodone-acetaminophen (NORCO) methocarbamol (ROBAXIN)  Medication Changes at Hospital Discharge: -Changed  None  Medications Discontinued at Hospital Discharge: -Stopped  None  Medications that remain the same after Hospital Discharge:??  -All other medications will remain the same.    Medication Reconciliation was completed by comparing discharge summary, patients EMR and Pharmacy list, and upon discussion with patient.  Patient presented to [facility] ED on 07/31/20 due to Non specific Chest pain and other concerns. Patient was there for 6 hours.  New?Medications Started at [facility] Discharge:?? -started  None  Medication Changes at Hospital Discharge: -Changed  None  Medications Discontinued at Hospital Discharge: -Stopped  None  Medications that remain the same after Hospital Discharge:??  -All  other medications will remain the same.    Medications: Outpatient Encounter Medications as of 01/07/2021  Medication Sig   acetaminophen (TYLENOL) 500 MG tablet Take 1,000 mg by mouth in the morning and at bedtime. Morning &  noon   aspirin 81 MG EC tablet Take 81 mg by mouth in the morning.   carvedilol (COREG) 6.25 MG tablet Take 0.5 tablets (3.125 mg total) by mouth 2 (two) times daily with a meal. Must have office visit for additional refills [phone]   celecoxib (CELEBREX) 100 MG capsule Take 1 capsule (100 mg total) by mouth daily. Take daily only as needed   ENTRESTO 24-26 MG TAKE 1/2 TABLET BY MOUTH 2 TIMES DAILY   esomeprazole (NEXIUM) 20 MG capsule TAKE 1 CAPSULE BY MOUTH EVERY DAY   furosemide (LASIX) 20 MG tablet Take 1 tablet (20 mg total) by mouth as needed.   Glucosamine-Chondroitin (OSTEO BI-FLEX REGULAR STRENGTH PO) Take 1 tablet by mouth 2 (two) times daily. Morning & noon   isosorbide mononitrate (IMDUR) 30 MG 24 hr tablet Take 30 mg by mouth daily. (Patient not taking: Reported on 11/14/2020)   Multiple Vitamin (MULTIVITAMIN WITH MINERALS) TABS tablet Take 1 tablet by mouth in the morning and at bedtime. [employer]   nitroGLYCERIN (NITROSTAT) 0.4 MG SL tablet Place 1 tablet (0.4 mg total) under the tongue every 5 (five) minutes as needed for chest pain.   REPATHA SURECLICK 140 MG/ML SOAJ INJECT INTO SKIN EVERY 14 DAYS   zolpidem (AMBIEN) 10 MG tablet TAKE 1 TABLET BY MOUTH AT BEDTIME   No facility-administered encounter medications on file as of 01/07/2021.  Fill History : carvedilol 6.25 mg tablet 12/11/2019 30   celecoxib 100 mg capsule 12/18/2020 30   ENTRESTO 24MG  / 26MG  TABLET 04/26/2020 30   esomeprazole magnesium 20 mg capsule,delayed release 12/17/2020 30   furosemide 20 mg tablet 09/10/2020 30   isosorbide mononitrate ER 30 mg tablet,extended release 24 hr 12/17/2020 30   nitroglycerin 0.4 mg sublingual tablet 08/14/2020 25   Repatha SureClick 140  mg/mL subcutaneous pen injector 12/17/2020 28   zolpidem 10 mg tablet 12/19/2020 30   Have you seen any other providers since your last visit? Patient reports none  Any changes in your medications or health? Patient reports no changes  Any side effects from any medications? Patient reports none  Do you have an symptoms or problems not managed by your medications? Patient reports his inflammation in his knees and hips.  Any concerns about your health right now? Patient reports none at this time  Has your provider asked that you check blood pressure, blood sugar, or follow special diet at home? Patient reports he has an automatic arm cuff  Do you get any type of exercise on a regular basis? Patient reports he does not use any assisted devices for mobility but does think it has decreased with his knee issues and he does have a limp when walking.  Can you think of a goal you would like to reach for your health? Patient reports not at this time  Do you have any problems getting your medications? Patient reports he is currently happy with his pharmacy and has no financial barriers to obtaining his medications.  Is there anything that you would like to discuss during the appointment? Patient reports none  Patient aware to have available his blood pressure readings, and medications for the call.   Care Gaps: BP- 132/86 (12/18/20) Zoster Vaccine - Overdue COVID Booster - Overdue Flu Vaccine - Overdue AWV-8/22  Star Rating Drugs: None    12/21/2020 CMA Clinical Pharmacist Assistant [phone]

## 2021-01-09 ENCOUNTER — Ambulatory Visit (INDEPENDENT_AMBULATORY_CARE_PROVIDER_SITE_OTHER): Payer: PPO | Admitting: Pharmacist

## 2021-01-09 DIAGNOSIS — E7800 Pure hypercholesterolemia, unspecified: Secondary | ICD-10-CM

## 2021-01-09 DIAGNOSIS — I10 Essential (primary) hypertension: Secondary | ICD-10-CM

## 2021-01-09 NOTE — Patient Instructions (Addendum)
Hi [name],  It was great to get to meet you over the telephone! Below is a summary of some of the topics we discussed.   Don't forget to: Get your flu shot and shingles shot when you can at the pharmacy Try to get an arm blood pressure cuff in order to check it about once a week at home to keep up with your readings Take the Nexium every other day to see how you do for a few weeks and you can take Pepcid or Tums on the days you aren't taking it Move your lunch time medications to dinner time to spread them out some and give you full day coverage  Please reach out to me if you have any questions or need anything before our follow up!  Best, [name]  [name], PharmD, [facility] [phone]   Visit Information   Goals Addressed             This Visit's Progress    Track and Manage My Blood Pressure-Hypertension       Timeframe:  Long-Range Goal Priority:  Medium Start Date:                             Expected End Date:                       Follow Up Date 04/09/21    - check blood pressure weekly - choose a place to take my blood pressure (home, clinic or office, retail store) - write blood pressure results in a log or diary    Why is this important?   You won't feel high blood pressure, but it can still hurt your blood vessels.  High blood pressure can cause heart or kidney problems. It can also cause a stroke.  Making lifestyle changes like losing a little weight or eating less salt will help.  Checking your blood pressure at home and at different times of the day can help to control blood pressure.  If the doctor prescribes medicine remember to take it the way the doctor ordered.  Call the office if you cannot afford the medicine or if there are questions about it.     Notes:        Patient Care Plan: CCM Pharmacy Care Plan     Problem Identified: Problem: Hypertension, Hyperlipidemia, Coronary Artery Disease, GERD,  Osteoarthritis, and Pre-diabetes      Long-Range Goal: Patient-Specific Goal   Start Date: 01/09/2021  Expected End Date: 01/09/2022  This Visit's Progress: On track  Priority: High  Note:   Current Barriers:  Suboptimal therapeutic regimen for heart failure Does not adhere to prescribed medication regimen  Pharmacist Clinical Goal(s):  Patient will verbalize ability to afford treatment regimen achieve adherence to monitoring guidelines and medication adherence to achieve therapeutic efficacy through collaboration with PharmD and provider.   Interventions: 1:1 collaboration with Tysinger, [name] Eng, PA-C regarding development and update of comprehensive plan of care as evidenced by provider attestation and co-signature Inter-disciplinary care team collaboration (see longitudinal plan of care) Comprehensive medication review performed; medication list updated in electronic medical record  Hypertension (BP goal <130/80) -Not ideally controlled -Current treatment: Carvedilol 6.25 mg 1/2 tablet twice daily - appropriate, query effective Entresto 24-26 mg 1/2 tablet daily daily - appropriate, query effective -Medications previously tried: n/a  -Current home readings: uses his wrist watch to keep up with  HR but not BP; 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 -Current dietary habits: limits salt intake; tries to order food without seasoning; eats out 3-4 times a week (doesn't eat fast food), doesn't use salt shaker at all -Current exercise habits: very active (gym and classes) -Denies hypotensive/hypertensive symptoms -Educated on BP goals and benefits of medications for prevention of heart attack, stroke and kidney damage; Importance of home blood pressure monitoring; Proper BP monitoring technique; Symptoms of hypotension and importance of maintaining adequate hydration; -Counseled to monitor BP at home weekly, document, and provide log at future appointments -Counseled on diet and  exercise extensively Recommended to continue current medication  Hyperlipidemia: (LDL goal < 70) -Not ideally controlled -Current treatment: Repatha 140 mg/ml inject every 2 weeks - appropriate, effective, safe, accessible -Medications previously tried: statins (myalgias), Zetia (myalgias) -Current dietary patterns: doesn't eat out too often -Current exercise habits: very active -Educated on Cholesterol goals;  Exercise goal of 150 minutes per week; -Counseled on diet and exercise extensively Recommended to continue current medication Assessed patient finances. Patient is getting Repatha through [employer].  CAD (Goal: prevent heart events) -Controlled -Current treatment  Aspirin 81 mg 1 tablet daily - appropriate, effective, safe, accessible  Nitroglycerin 0.4 mg SL as needed - appropriate, effective, safe, accessible -Medications previously tried: isosorbide (side effects)  -Recommended to continue current medication  Heart Failure (Goal: manage symptoms and prevent exacerbations) -Controlled -Last ejection fraction: 40% (Date: 08/30/20) -HF type: Left Ventricular Failure -NYHA Class: II (slight limitation of activity) -AHA HF Stage: C (Heart disease and symptoms present) -Current treatment: Carvedilol 6.25 mg 1/2 tablet twice daily - appropriate, query effective Entresto 24-26 mg 1/2 tablet daily daily - appropriate, query effective Furosemide 20 mg 1 tablet daily as needed - appropriate, effective, safe, accessible -Medications previously tried: n/a  -Current home BP/HR readings: refer to above; HR in the 90s -Current dietary habits:  limits salt intake; tries to order food without seasoning; eats out 3-4 times a week (doesn't eat fast food), doesn't use salt shaker at all -Current exercise habits: very active -Educated on Benefits of medications for managing symptoms and prolonging life Importance of weighing daily; if you gain more than 3 pounds in one day or 5  pounds in one week, call cardiologist. -Counseled on diet and exercise extensively Recommended to continue current medication Recommended moving carvedilol and Entresto to evening meal instead of lunch to space out doses.  Osteoarthritis (Goal: minimize pain) -Not ideally controlled -Current treatment  Glucosamine-chondroitin 1 tablet twice daily  Acetaminophen 500 mg 2 tablets twice daily as needed Diclofenac 75 mg 1 tablet twice daily - appropriate, effective, query safe -Medications previously tried: Celecoxib  -Counseled on limiting use of NSAIDs and not taking both diclofenac and celecoxib.  Insomnia (Goal: improve quality and quantity of sleep) -Controlled -Current treatment  Zolpidem 10 mg 1 tablet at bedtime - appropriate, effective, query safe -Medications previously tried: trazodone, Belsomra  -Counseled on risks of taking Ambien and considering tapering in the future.  GERD (Goal: minimize symptoms) -Controlled -Current treatment  Esomeprazole 20 mg 1 capsule daily - appropriate, effective, query safe -Medications previously tried: none  -Counseled on step down therapy and plan to take every other day for a while to see how he tolerates. Recommended taking tums or Pepcid on the days he isn't taking Nexium.   Health Maintenance -Vaccine gaps: shingrix, influenza -Current therapy:  Multivitamin 1 tablet twice daily Claritin 10 mg as needed -Educated on Cost vs benefit  of each product must be carefully weighed by individual consumer -Patient is satisfied with current therapy and denies issues -Recommended to continue current medication  Patient Goals/Self-Care Activities Patient will:  - take medications as prescribed as evidenced by patient report and record review check blood pressure weekly, document, and provide at future appointments target a minimum of 150 minutes of moderate intensity exercise weekly  Follow Up Plan: Telephone follow up appointment with care  management team member scheduled for: 6 months      Mr. Writt was given information about Chronic Care Management services today including:  CCM service includes personalized support from designated clinical staff supervised by his physician, including individualized plan of care and coordination with other care providers 24/7 contact phone numbers for assistance for urgent and routine care needs. Standard insurance, coinsurance, copays and deductibles apply for chronic care management only during months in which we provide at least 20 minutes of these services. Most insurances cover these services at 100%, however patients may be responsible for any copay, coinsurance and/or deductible if applicable. This service may help you avoid the need for more expensive face-to-face services. Only one practitioner may furnish and bill the service in a calendar month. The patient may stop CCM services at any time (effective at the end of the month) by phone call to the office staff.  Patient agreed to services and verbal consent obtained.   The patient verbalized understanding of instructions, educational materials, and care plan provided today and agreed to receive a mailed copy of patient instructions, educational materials, and care plan.  Telephone follow up appointment with pharmacy team member scheduled for: 6 months  Viona Gilmore, [facility]

## 2021-01-09 NOTE — Progress Notes (Signed)
Chronic Care Management Pharmacy Note  01/09/2021 Name:  [name] MRN:  [id number] DOB:  [birth year]  Summary: BP not ideally at goal < 130/80  Recommendations/Changes made from today's visit: -Recommended obtaining a BP cuff to monitor weekly at home -Recommended trial of Nexium every other day to see how he tolerates -Recommended moving lunch time medications to dinner to spread out  Plan: PAP for Entresto BP assessment in 2-3 months   Subjective: [name] is an 66 y.o. year old male who is a primary patient of Carlena Hurl, PA-C.  The CCM team was consulted for assistance with disease management and care coordination needs.    Engaged with patient by telephone for initial visit in response to provider referral for pharmacy case management and/or care coordination services.   Consent to Services:  The patient was given the following information about Chronic Care Management services today, agreed to services, and gave verbal consent: 1. CCM service includes personalized support from designated clinical staff supervised by the primary care provider, including individualized plan of care and coordination with other care providers 2. 24/7 contact phone numbers for assistance for urgent and routine care needs. 3. Service will only be billed when office clinical staff spend 20 minutes or more in a month to coordinate care. 4. Only one practitioner may furnish and bill the service in a calendar month. 5.The patient may stop CCM services at any time (effective at the end of the month) by phone call to the office staff. 6. The patient will be responsible for cost sharing (co-pay) of up to 20% of the service fee (after annual deductible is met). Patient agreed to services and consent obtained.  Patient Care Team: Tysinger, Leward Quan as PCP - General (Family Medicine) Martinique, Peter M, MD as PCP - Cardiology (Cardiology) Deboraha Sprang, MD as PCP - Electrophysiology  (Cardiology) Viona Gilmore, [facility] as Pharmacist (Pharmacist)  Recent office visits: 09/03/20 Tysinger, Camelia Eng, PA-C - Patient presented for Encounter for health maintenance examination and other concerns.  Recent consult visits: 12/18/20 Elba Barman, DO (Sports Med) - Patient presented for Greater trochanteric pain syndrome of right lower extremity and other concerns. Prescribed Celecoxib 100 mg.    11/20/20  Elba Barman, DO (Sports Med) - Patient presented for primary osteoarthritis of right knee and other concerns. No medication changes.   11/14/20 Shirley Friar, PA-C (Cardiology) - Patient presented for Ischemic cardiomyopathy and other concerns. Stopped Empagliflozin, Stopped Hydrocodone - Acetaminophen, Stopped Methocarbamol, Stopped OTC bedtime supplements   10/28/20 Consuella Lose (Neurosurgery) - Patient presented for biomechanical lesions of lumbar region. No other visit details available.   10/17/20 Simone Curia, RN (Cardiology) - Patient presented for Ischemic cardiomyopathy. No medication changes.   09/06/20 Martinique, Peter M, MD (Cardiology) - Patient presented for RIGHT/LEFT HEART CATH AND CORONARY/GRAFT ANGIOGRAPHY Started Jardiance Stopped Isosorbide Monitrate   09/01/20 Martinique, Peter M, MD (Cardiology) - Patient presented for  Acute chronic and systolic/diastolic CHF and other concerns. Stopped Rifaximin 550 mg.   08/28/20 Consuella Lose (Neurosurgery) - Patient presented for disc degeneration and other concerns. No other visit details available.   08/26/20 [place] - Patient presented for Knee pain and other concerns. No medication changes.   08/26/20 Reece Agar (Anesthesiology) - Patient presented for back hip and knee pain. No other visit details available.   08/14/20 Shirley Friar, PA-C (Cardiology) - Patient presented for Ischemic cardiomyopathy and other concerns. Prescribed  Furosemide 20 mg,  Isosorbide Mononitrate 30 mg, Nitroglycerin 0.4 mg. Stopped Eluxadoline 75 mg, Fenofibrate 145 mg and Sulfamethoxazole - Trimethoprim 800 mg    07/23/20 Arturo Morton (Anesthesiology) - Patient presented for back hip and knee pain. No other visit details available.   07/18/20 Simone Curia, RN (Cardiology) - Patient presented for Ischemic cardiomyopathy. No medication changes.   07/18/20 Deboraha Sprang (Cardiology) - Patient presented for Ischemic cardiomyopathy. No other visit details available.  Hospital visits: Medication Reconciliation was completed by comparing discharge summary, patients EMR and Pharmacy list, and upon discussion with patient.   Patient presented to [facility] on 10/03/20 due to Elective Surgery. Patient was there for 3 days.   New?Medications Started at [facility] Discharge:?? -started  HYDROcodone-acetaminophen ([place]) methocarbamol (ROBAXIN)   Medication Changes at Hospital Discharge: -Changed  None   Medications Discontinued at Hospital Discharge: -Stopped  None   Medications that remain the same after Hospital Discharge:??  -All other medications will remain the same.     Medication Reconciliation was completed by comparing discharge summary, patients EMR and Pharmacy list, and upon discussion with patient.   Patient presented to [facility] on 07/31/20 due to Non specific Chest pain and other concerns. Patient was there for 6 hours.   New?Medications Started at [facility] Discharge:?? -started  None   Medication Changes at Hospital Discharge: -Changed  None   Medications Discontinued at Hospital Discharge: -Stopped  None   Medications that remain the same after Hospital Discharge:??  -All other medications will remain the same.      Objective:  Lab Results  Component Value Date   CREATININE 0.99 11/14/2020   BUN 16 11/14/2020   GFR 56.51 (L) 06/08/2013   GFRNONAA >60 10/04/2020   GFRAA 104 12/05/2019   NA 140  11/14/2020   K 4.7 11/14/2020   CALCIUM 9.8 11/14/2020   CO2 27 11/14/2020   GLUCOSE 128 (H) 11/14/2020    Lab Results  Component Value Date/Time   HGBA1C 6.1 (H) 09/03/2020 02:20 PM   HGBA1C 5.9 (H) 06/10/2017 03:37 AM   GFR 56.51 (L) 06/08/2013 08:41 AM   GFR 64.32 12/17/2011 09:20 AM    Last diabetic Eye exam: No results found for: HMDIABEYEEXA  Last diabetic Foot exam: No results found for: HMDIABFOOTEX   Lab Results  Component Value Date   CHOL 153 08/30/2020   HDL 54 08/30/2020   LDLCALC 71 08/30/2020   TRIG 168 (H) 08/30/2020   CHOLHDL 2.8 08/30/2020    Hepatic Function Latest Ref Rng & Units 08/30/2020 07/31/2020 07/06/2020  Total Protein 6.0 - 8.5 g/dL 6.6 7.3 6.2(L)  Albumin 3.8 - 4.8 g/dL 4.6 4.5 3.6  AST 0 - 40 IU/L _0 ALT 0 - 44 IU/L _1 Alk Phosphatase 44 - 121 IU/L 58 51 51  Total Bilirubin 0.0 - 1.2 mg/dL 0.6 1.0 1.0  Bilirubin, Direct 0.0 - 0.2 mg/dL - 0.2 -    Lab Results  Component Value Date/Time   TSH 1.830 09/03/2020 02:20 PM   TSH 2.550 12/05/2019 09:31 AM   FREET4 0.95 11/19/2014 08:02 PM    CBC Latest Ref Rng & Units 11/14/2020 10/04/2020 10/03/2020  WBC 3.4 - 10.8 x10E3/uL 5.3 14.0(H) 13.5(H)  Hemoglobin 13.0 - 17.7 g/dL 14.0 11.9(L) 12.7(L)  Hematocrit 37.5 - 51.0 % 42.7 37.4(L) 39.4  Platelets 150 - 450 x10E3/uL 224 205 209    No results found for: VD25OH  Clinical ASCVD:  Yes  The ASCVD Risk score (Arnett DK, et al., 2019) failed to calculate for the following reasons:   The patient has a prior MI or stroke diagnosis    Depression screen [facility] 2/9 09/03/2020 01/11/2020 12/05/2019  Decreased Interest 0 0 0  Down, Depressed, Hopeless 0 0 0  PHQ - 2 Score 0 0 0  Some recent data might be hidden     Social History   Tobacco Use  Smoking Status Former   Packs/day: 2.00   Years: 20.00   Pack years: 40.00   Types: Cigarettes   Quit date: 05/07/1999   Years since quitting: 21.6  Smokeless Tobacco Never   BP Readings from  Last 3 Encounters:  12/18/20 132/86  11/20/20 120/70  11/14/20 120/68   Pulse Readings from Last 3 Encounters:  11/14/20 95  10/06/20 71  09/24/20 80   Wt Readings from Last 3 Encounters:  12/18/20 228 lb (103.4 kg)  11/20/20 226 lb (102.5 kg)  11/14/20 228 lb (103.4 kg)   BMI Readings from Last 3 Encounters:  12/18/20 29.27 kg/m  11/20/20 29.02 kg/m  11/14/20 29.27 kg/m    Assessment/Interventions: Review of patient past medical history, allergies, medications, health status, including review of consultants reports, laboratory and other test data, was performed as part of comprehensive evaluation and provision of chronic care management services.   SDOH:  (Social Determinants of Health) assessments and interventions performed: Yes SDOH Interventions    Flowsheet Row Most Recent Value  SDOH Interventions   Financial Strain Interventions Intervention Not Indicated  Transportation Interventions Intervention Not Indicated      SDOH Screenings   Alcohol Screen: Not on file  Depression (PHQ2-9): Low Risk    PHQ-2 Score: 0  Financial Resource Strain: Low Risk    Difficulty of Paying Living Expenses: Not very hard  Food Insecurity: Not on file  Housing: Not on file  Physical Activity: Not on file  Social Connections: Not on file  Stress: Not on file  Tobacco Use: Medium Risk   Smoking Tobacco Use: Former   Smokeless Tobacco Use: Never   Passive Exposure: Not on file  Transportation Needs: No Transportation Needs   Lack of Transportation (Medical): No   Lack of Transportation (Non-Medical): No    Patient rides his exercise bike at least 5 days a week, which is a road bike on a tripod, and goes to the gym 3 days a week for light weights and he just started doing senior arm chair yoga in the evening 2 weeks ago. He averages about 90-96 minutes a day of exercise.   Patient reports he is sleeping fairly until 5 am and finally gets up 7 am. He usually goes to bed around  10 pm and it shortens his night. Patient has tried to go weeks with Ambien. He tried to do half a pill back in Sept and October and it immediately affected him. He also uses melatonin as well. He went 10 days without anything except deep sleep. He has tried trazodone and Belsomra in the past. He feels his sleep schedule is pretty consistent. Patient reads his Bible before going to bed.   Patient denies any current issues with his medications. He didn't even start the Jardiance because of cost and he didn't want to take another medication. The cost of Delene Loll is also high for him.  CCM Care Plan  Allergies  Allergen Reactions   Ezetimibe Other (See Comments)    Joint pain and drops BP  Niacin Other (See Comments)    Drops BP and severe   Statins Other (See Comments)    Severe joint pain and drops BP.    Medications Reviewed Today     Reviewed by Viona Gilmore, [facility] (Pharmacist) on 01/09/21 at 50  Med List Status: <None>   Medication Order Taking? Sig Documenting Provider Last Dose Status Informant  acetaminophen (TYLENOL) 500 MG tablet [id number] Yes Take 1,000 mg by mouth in the morning and at bedtime. Morning & noon [provider] Taking Active Self  aspirin 81 MG EC tablet [id number] Yes Take 81 mg by mouth in the morning. [provider] Taking Active Self  carvedilol (COREG) 6.25 MG tablet [id number] Yes Take 0.5 tablets (3.125 mg total) by mouth 2 (two) times daily with a meal. Must have office visit for additional refills [phone] Martinique, Peter M, MD Taking Active   diclofenac (VOLTAREN) 75 MG EC tablet [id number] Yes Take 75 mg by mouth 2 (two) times daily. [provider] Taking Active   [place] 24-26 [place] [id number] Yes TAKE 1/2 TABLET BY MOUTH 2 TIMES DAILY Martinique, Peter M, MD Taking Active   esomeprazole (NEXIUM) 20 MG capsule [id number] Yes TAKE 1 CAPSULE BY MOUTH EVERY DAY Martinique, Peter M, MD Taking Active   furosemide (LASIX) 20 MG tablet  [id number] Yes Take 1 tablet (20 mg total) by mouth as needed. Shirley Friar, PA-C Taking Active            Med Note Constance Haw, Grover Canavan Nov 14, 2020  9:28 AM)    Glucosamine-Chondroitin (OSTEO BI-FLEX REGULAR STRENGTH PO) [id number] Yes Take 1 tablet by mouth 2 (two) times daily. Morning & noon [provider] Taking Active Self  Multiple Vitamin (MULTIVITAMIN WITH MINERALS) TABS tablet [id number]  Take 1 tablet by mouth in the morning and at bedtime. [place] [employer] [provider]  Active Self  nitroGLYCERIN (NITROSTAT) 0.4 MG SL tablet [id number] Yes Place 1 tablet (0.4 mg total) under the tongue every 5 (five) minutes as needed for chest pain. Shirley Friar, PA-C Taking Active Self  REPATHA SURECLICK 007 MG/ML Darden Palmer [id number] Yes INJECT INTO SKIN EVERY 96 DAYS Martinique, Peter M, MD Taking Active Self  zolpidem (AMBIEN) 10 MG tablet [id number] Yes TAKE 1 TABLET BY MOUTH AT BEDTIME Carlena Hurl, PA-C Taking Active             Patient Active Problem List   Diagnosis Date Noted   Lumbar back pain 10/03/2020   Pseudoarthrosis of lumbar spine 10/03/2020   Unstable angina ([place]) 09/06/2020   Encounter for health maintenance examination in adult 09/03/2020   Encounter for screening for malignant neoplasm of colon 09/03/2020   Impaired fasting blood sugar 09/03/2020   Fatigue 09/03/2020   Screening for prostate cancer 09/03/2020   History of gout 09/03/2020   Medicare welcome exam 09/03/2020   Insomnia 01/11/2020   Back pain 01/11/2020   History of urinary tract infection 01/11/2020   Vaccine counseling 01/11/2020   Rumination 01/11/2020   Anxiety 01/11/2020   Chronic diarrhea 12/05/2019   Food intolerance 12/05/2019   Abdominal cramping 12/05/2019   Abdominal bloating 12/05/2019   Need for influenza vaccination 12/05/2019   Right knee pain 11/03/2019   Shoulder pain, acute 06/09/2019   Hip pain 02/24/2019   Knee pain 02/24/2019   Bulbous  urethral stricture 10/15/2017   Personal history of urethral stricture 73/42/8768   Chronic systolic heart failure ([place]) 06/22/2017   S/P  CABG x 4 06/14/2017   ACS (acute coronary syndrome) ([place]) 06/08/2017   Angina pectoris ([place])    Gout 08/30/2014   Neck pain, chronic 12/17/2010   Back pain, chronic 12/01/2010   Pericarditis 10/02/2010   CAD (coronary artery disease) 05/14/2010   Hypertension    Hypercholesterolemia    LBBB (left bundle branch block)    NSTEMI (non-ST elevated myocardial infarction) ([place])     Immunization History  Administered Date(s) Administered   Fluad Quad(high Dose 65+) 12/05/2019   Influenza,inj,Quad PF,6+ Mos 02/17/2017, 10/16/2017   Influenza,inj,quad, With Preservative 10/18/2018   Influenza-Unspecified 10/30/2015   PFIZER(Purple Top)SARS-COV-2 Vaccination 05/27/2019, 10/31/2019   PNEUMOCOCCAL CONJUGATE-20 12/11/2020   Pfizer Covid-19 Vaccine Bivalent Booster 85yr & up 11/14/2020   Tdap 02/27/2016    Conditions to be addressed/monitored:  Hypertension, Hyperlipidemia, Coronary Artery Disease, GERD, Osteoarthritis, and Pre-diabetes  Care Plan : [place] Updates made by PViona Gilmore RButtssince 01/09/2021 12:00 AM     Problem: Problem: Hypertension, Hyperlipidemia, Coronary Artery Disease, GERD, Osteoarthritis, and Pre-diabetes      Long-Range Goal: Patient-Specific Goal   Start Date: 01/09/2021  Expected End Date: 01/09/2022  This Visit's Progress: On track  Priority: High  Note:   Current Barriers:  Suboptimal therapeutic regimen for heart failure Does not adhere to prescribed medication regimen  Pharmacist Clinical Goal(s):  Patient will verbalize ability to afford treatment regimen achieve adherence to monitoring guidelines and medication adherence to achieve therapeutic efficacy through collaboration with PharmD and provider.   Interventions: 1:1 collaboration with Tysinger, DCamelia Eng PA-C regarding development and  update of comprehensive plan of care as evidenced by provider attestation and co-signature Inter-disciplinary care team collaboration (see longitudinal plan of care) Comprehensive medication review performed; medication list updated in electronic medical record  Hypertension (BP goal <130/80) -Not ideally controlled -Current treatment: Carvedilol 6.25 mg 1/2 tablet twice daily - appropriate, query effective Entresto 24-26 mg 1/2 tablet daily daily - appropriate, query effective -Medications previously tried: n/a  -Current home readings: uses his wrist watch to keep up with HR but not BP; 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 -Current dietary habits: limits salt intake; tries to order food without seasoning; eats out 3-4 times a week (doesn't eat fast food), doesn't use salt shaker at all -Current exercise habits: very active (gym and classes) -Denies hypotensive/hypertensive symptoms -Educated on BP goals and benefits of medications for prevention of heart attack, stroke and kidney damage; Importance of home blood pressure monitoring; Proper BP monitoring technique; Symptoms of hypotension and importance of maintaining adequate hydration; -Counseled to monitor BP at home weekly, document, and provide log at future appointments -Counseled on diet and exercise extensively Recommended to continue current medication  Hyperlipidemia: (LDL goal < 70) -Not ideally controlled -Current treatment: Repatha 140 mg/ml inject every 2 weeks - appropriate, effective, safe, accessible -Medications previously tried: statins (myalgias), Zetia (myalgias) -Current dietary patterns: doesn't eat out too often -Current exercise habits: very active -Educated on Cholesterol goals;  Exercise goal of 150 minutes per week; -Counseled on diet and exercise extensively Recommended to continue current medication Assessed patient finances. Patient is getting Repatha through [employer]  CAD (Goal:  prevent heart events) -Controlled -Current treatment  Aspirin 81 mg 1 tablet daily - appropriate, effective, safe, accessible  Nitroglycerin 0.4 mg SL as needed - appropriate, effective, safe, accessible -Medications previously tried: isosorbide (side effects)  -Recommended to continue current medication  Heart Failure (Goal: manage symptoms and prevent exacerbations) -Controlled -  Last ejection fraction: 40% (Date: 08/30/20) -HF type: Left Ventricular Failure -NYHA Class: II (slight limitation of activity) -AHA HF Stage: C (Heart disease and symptoms present) -Current treatment: Carvedilol 6.25 mg 1/2 tablet twice daily - appropriate, query effective Entresto 24-26 mg 1/2 tablet daily daily - appropriate, query effective Furosemide 20 mg 1 tablet daily as needed - appropriate, effective, safe, accessible -Medications previously tried: n/a  -Current home BP/HR readings: refer to above; HR in the 90s -Current dietary habits:  limits salt intake; tries to order food without seasoning; eats out 3-4 times a week (doesn't eat fast food), doesn't use salt shaker at all -Current exercise habits: very active -Educated on Benefits of medications for managing symptoms and prolonging life Importance of weighing daily; if you gain more than 3 pounds in one day or 5 pounds in one week, call cardiologist. -Counseled on diet and exercise extensively Recommended to continue current medication Recommended moving carvedilol and Entresto to evening meal instead of lunch to space out doses.  Osteoarthritis (Goal: minimize pain) -Not ideally controlled -Current treatment  Glucosamine-chondroitin 1 tablet twice daily  Acetaminophen 500 mg 2 tablets twice daily as needed Diclofenac 75 mg 1 tablet twice daily - appropriate, effective, query safe -Medications previously tried: Celecoxib  -Counseled on limiting use of NSAIDs and not taking both diclofenac and celecoxib.  Insomnia (Goal: improve quality and  quantity of sleep) -Controlled -Current treatment  Zolpidem 10 mg 1 tablet at bedtime - appropriate, effective, query safe -Medications previously tried: trazodone, Belsomra  -Counseled on risks of taking Ambien and considering tapering in the future.  GERD (Goal: minimize symptoms) -Controlled -Current treatment  Esomeprazole 20 mg 1 capsule daily - appropriate, effective, query safe -Medications previously tried: none  -Counseled on step down therapy and plan to take every other day for a while to see how he tolerates. Recommended taking tums or Pepcid on the days he isn't taking Nexium.   Health Maintenance -Vaccine gaps: shingrix, influenza -Current therapy:  Multivitamin 1 tablet twice daily Claritin 10 mg as needed -Educated on Cost vs benefit of each product must be carefully weighed by individual consumer -Patient is satisfied with current therapy and denies issues -Recommended to continue current medication  Patient Goals/Self-Care Activities Patient will:  - take medications as prescribed as evidenced by patient report and record review check blood pressure weekly, document, and provide at future appointments target a minimum of 150 minutes of moderate intensity exercise weekly  Follow Up Plan: Telephone follow up appointment with care management team member scheduled for: 6 months       Medication Assistance: Application for Entresto  medication assistance program. in process.  Anticipated assistance start date 02/09/21.  See plan of care for additional detail.  Compliance/Adherence/Medication fill history: Care Gaps: COVID booster, shingrix, influenza BP- 132/86 (12/18/20)  Star-Rating Drugs: None  Patient's preferred pharmacy is:  [facility] - [place], [place] - [address] [place] [address] Phone: [phone] Fax: [phone] [place], [place] - [place]  [address] Phone: [phone] Fax: [phone]  Uses pill box? Yes Pt endorses 99% compliance  We discussed: Current pharmacy is preferred with insurance plan and patient is satisfied with pharmacy services Patient decided to: Continue current medication management strategy  Care Plan and Follow Up Patient Decision:  Patient agrees to Care Plan and Follow-up.  Plan: Telephone follow up appointment with care management team member scheduled for:  6 months  Jeni Salles, PharmD, [facility] [phone]

## 2021-01-13 ENCOUNTER — Other Ambulatory Visit: Payer: Self-pay | Admitting: Medical

## 2021-01-16 ENCOUNTER — Ambulatory Visit (INDEPENDENT_AMBULATORY_CARE_PROVIDER_SITE_OTHER): Payer: PPO

## 2021-01-16 DIAGNOSIS — I255 Ischemic cardiomyopathy: Secondary | ICD-10-CM

## 2021-01-16 LAB — CUP PACEART REMOTE DEVICE CHECK
Battery Remaining Longevity: 17 mo
Battery Voltage: 2.92 V
Brady Statistic AP VP Percent: 0.05 %
Brady Statistic AP VS Percent: 0.02 %
Brady Statistic AS VP Percent: 99.23 %
Brady Statistic AS VS Percent: 0.7 %
Brady Statistic RA Percent Paced: 0.07 %
Brady Statistic RV Percent Paced: 98.36 %
Date Time Interrogation Session: 20230112033326
HighPow Impedance: 79 Ohm
Implantable Lead Implant Date: 20190610
Implantable Lead Implant Date: 20200306
Implantable Lead Implant Date: 20200306
Implantable Lead Location: 753858
Implantable Lead Location: 753859
Implantable Lead Location: 753860
Implantable Lead Model: 5071
Implantable Lead Model: 5076
Implantable Pulse Generator Implant Date: 20200306
Lead Channel Impedance Value: 285 Ohm
Lead Channel Impedance Value: 380 Ohm
Lead Channel Impedance Value: 4047 Ohm
Lead Channel Impedance Value: 4047 Ohm
Lead Channel Impedance Value: 437 Ohm
Lead Channel Impedance Value: 456 Ohm
Lead Channel Pacing Threshold Amplitude: 0.625 V
Lead Channel Pacing Threshold Amplitude: 0.75 V
Lead Channel Pacing Threshold Amplitude: 1.875 V
Lead Channel Pacing Threshold Pulse Width: 0.4 ms
Lead Channel Pacing Threshold Pulse Width: 0.4 ms
Lead Channel Pacing Threshold Pulse Width: 1 ms
Lead Channel Sensing Intrinsic Amplitude: 31.625 mV
Lead Channel Sensing Intrinsic Amplitude: 31.625 mV
Lead Channel Sensing Intrinsic Amplitude: 4.375 mV
Lead Channel Sensing Intrinsic Amplitude: 4.375 mV
Lead Channel Setting Pacing Amplitude: 1.5 V
Lead Channel Setting Pacing Amplitude: 2.5 V
Lead Channel Setting Pacing Amplitude: 3 V
Lead Channel Setting Pacing Pulse Width: 0.4 ms
Lead Channel Setting Pacing Pulse Width: 1 ms
Lead Channel Setting Sensing Sensitivity: 0.6 mV

## 2021-01-22 ENCOUNTER — Ambulatory Visit (INDEPENDENT_AMBULATORY_CARE_PROVIDER_SITE_OTHER): Payer: PPO | Admitting: Sports Medicine

## 2021-01-22 VITALS — BP 118/74 | Ht 73.0 in | Wt 228.0 lb

## 2021-01-22 DIAGNOSIS — G8929 Other chronic pain: Secondary | ICD-10-CM

## 2021-01-22 DIAGNOSIS — M25561 Pain in right knee: Secondary | ICD-10-CM | POA: Diagnosis not present

## 2021-01-22 DIAGNOSIS — M1711 Unilateral primary osteoarthritis, right knee: Secondary | ICD-10-CM

## 2021-01-22 DIAGNOSIS — G5702 Lesion of sciatic nerve, left lower limb: Secondary | ICD-10-CM | POA: Diagnosis not present

## 2021-01-22 DIAGNOSIS — M25551 Pain in right hip: Secondary | ICD-10-CM | POA: Diagnosis not present

## 2021-01-22 MED ORDER — METHYLPREDNISOLONE ACETATE 80 MG/ML IJ SUSP
80.0000 mg | Freq: Once | INTRAMUSCULAR | Status: AC
  Administered 2021-01-22: 80 mg via INTRA_ARTICULAR

## 2021-01-22 MED ORDER — KETOROLAC TROMETHAMINE 60 MG/2ML IM SOLN
30.0000 mg | Freq: Once | INTRAMUSCULAR | Status: AC
  Administered 2021-01-22: 30 mg via INTRAMUSCULAR

## 2021-01-22 NOTE — Progress Notes (Signed)
PCP: [name], PA-C  Subjective:   HPI: Patient is a 66 y.o. male here for follow-up of acute on chronic right knee pain; also with left gluteal pain.  Right knee pain - acute on chronic issue for him.  He last had x-rays back in 2/21 which did show some DJD throughout the knee and patellofemoral joint.  He has at least moderate medial joint space narrowing of the right knee.  About 3 months ago he did have a cortisone shot which significantly reduced his pain, however about a month ago he was swimming laps at the pool and when repeatedly kicking his legs to swim this aggravated the right knee.  There is occasional intermittent swelling, although no erythema or redness.  He has pain on the anterior aspect of the knee, worse on the medial aspect.  At times will get some crepitus, no locking or catching.  He did trial a brief course of Celebrex, but did not note to much improvement.  Right hip -this is much improved, currently no pain.  He continues with his hip stabilizing and lateral hip abductor exercises at least once daily at home.  Reports good strength and minimal pain.  Left posterior hip - aggravated about 1-1.5 weeks ago.  Denies any known injury.  He states he feels like he may have been walking differently because of the right knee pain and he felt a sharp pain in the left buttock/hip area.  It feels deep within the glue muscle.  It is worse with activity, sometimes gets pain with lying on that side at nighttime.  Denies any pain of the lateral hip or greater trochanter area.  He has tried icing the area and using a TENS unit with only mild relief.  He denies any radicular pain no numbness or tingling going down the leg.    Past Medical History:  Diagnosis Date   AICD (automatic cardioverter/defibrillator) present    Anxiety    Arthritis    "mild; back, neck, spine" (06/09/2017)   CHF (congestive heart failure) (HCC)    Chronic back pain    Coronary artery disease    Dyspnea     GERD (gastroesophageal reflux disease)    History of gout    History of kidney stones X 2   Hypercholesterolemia    Hypertension    LBBB (left bundle branch block)    MI (myocardial infarction) (HCC) 1993   LATERAL   MI (myocardial infarction) (HCC) ?09/2001; 06/07/2017   Pneumonia    "several times" (06/09/2017)   S/P coronary artery stent placement    RCA   Varicose veins     Current Outpatient Medications on File Prior to Visit  Medication Sig Dispense Refill   acetaminophen (TYLENOL) 500 MG tablet Take 1,000 mg by mouth in the morning and at bedtime. Morning & noon     aspirin 81 MG EC tablet Take 81 mg by mouth in the morning.     carvedilol (COREG) 6.25 MG tablet Take 0.5 tablets (3.125 mg total) by mouth 2 (two) times daily with a meal. Must have office visit for additional refills [phone] tablet 0   diclofenac (VOLTAREN) 75 MG EC tablet Take 75 mg by mouth 2 (two) times daily.     ENTRESTO 24-26 MG TAKE 1/2 TABLET BY MOUTH 2 TIMES DAILY 90 tablet 2   esomeprazole (NEXIUM) 20 MG capsule TAKE 1 CAPSULE BY MOUTH EVERY DAY 90 capsule 2   furosemide (LASIX) 20 MG tablet Take  1 tablet (20 mg total) by mouth as needed. 30 tablet 3   Glucosamine-Chondroitin (OSTEO BI-FLEX REGULAR STRENGTH PO) Take 1 tablet by mouth 2 (two) times daily. Morning & noon     loratadine (CLARITIN) 10 MG tablet Take 10 mg by mouth daily.     Multiple Vitamin (MULTIVITAMIN WITH MINERALS) TABS tablet Take 1 tablet by mouth in the morning and at bedtime. [employer]     nitroGLYCERIN (NITROSTAT) 0.4 MG SL tablet Place 1 tablet (0.4 mg total) under the tongue every 5 (five) minutes as needed for chest pain. 90 tablet 3   REPATHA SURECLICK 140 MG/ML SOAJ INJECT INTO SKIN EVERY 14 DAYS 2 mL 11   zolpidem (AMBIEN) 10 MG tablet TAKE 1 TABLET BY MOUTH AT BEDTIME 30 tablet 0   No current facility-administered medications on file prior to visit.    Past Surgical History:  Procedure Laterality Date    ANTERIOR CERVICAL DECOMP/DISCECTOMY FUSION  03/03/2011   Procedure: ANTERIOR CERVICAL DECOMPRESSION/DISCECTOMY FUSION 3 LEVELS;  Surgeon: Karn Cassis, MD;  Location: [facility];  Service: Neurosurgery;  Laterality: N/A;  Cervical three-four Cervical four-five Cervical five-six Anterior cervical decompression/diskectomy, fusion   APPLICATION OF ROBOTIC ASSISTANCE FOR SPINAL PROCEDURE N/A 10/03/2020   Procedure: APPLICATION OF ROBOTIC ASSISTANCE FOR SPINAL PROCEDURE;  Surgeon: Lisbeth Renshaw, MD;  Location: [facility] OR;  Service: Neurosurgery;  Laterality: N/A;   BACK SURGERY     CARDIAC CATHETERIZATION  2009   Stents in RCA patent. 70 to 80% PL, and 50% ostial PD.    CARDIAC CATHETERIZATION N/A 11/20/2014   Procedure: Left Heart Cath and Coronary Angiography;  Surgeon: Peter M Swaziland, MD;  Location: [facility] INVASIVE CV LAB;  Service: Cardiovascular;  Laterality: N/A;   CORONARY ANGIOPLASTY     DIRECT ANGIOPLASTY THE MARGINAL BRANCH   CORONARY ANGIOPLASTY WITH STENT PLACEMENT     RCA   CORONARY ARTERY BYPASS GRAFT N/A 06/14/2017   Procedure: CORONARY ARTERY BYPASS GRAFTING (CABG) x four, using left internal mammary artery and right leg greater saphenous vein harvested endoscopically;  Surgeon: Kerin Perna, MD;  Location: [facility] OR;  Service: Open Heart Surgery;  Laterality: N/A;   CYSTOSCOPY N/A 06/14/2017   Procedure: CYSTOSCOPY;  Surgeon: Kerin Perna, MD;  Location: [facility] OR;  Service: Open Heart Surgery;  Laterality: N/A;   ICD IMPLANT N/A 03/11/2018   Procedure: ICD IMPLANT - Dual Chamber;  Surgeon: [name], MD;  Location: [facility] INVASIVE CV LAB;  Service: Cardiovascular;  Laterality: N/A;   INSERTION OF SUPRAPUBIC CATHETER N/A 06/14/2017   Procedure: INSERTION OF SUPRAPUBIC CATHETER - Lower abdomen;  Surgeon: Kerin Perna, MD;  Location: [facility] OR;  Service: Open Heart Surgery;  Laterality: N/A;   POSTERIOR LUMBAR FUSION  10/2011   RIGHT/LEFT HEART CATH AND CORONARY ANGIOGRAPHY N/A 06/08/2017    Procedure: RIGHT/LEFT HEART CATH AND CORONARY ANGIOGRAPHY;  Surgeon: Swaziland, Peter M, MD;  Location: [facility] INVASIVE CV LAB;  Service: Cardiovascular;  Laterality: N/A;   RIGHT/LEFT HEART CATH AND CORONARY/GRAFT ANGIOGRAPHY N/A 09/06/2020   Procedure: RIGHT/LEFT HEART CATH AND CORONARY/GRAFT ANGIOGRAPHY;  Surgeon: Swaziland, Peter M, MD;  Location: [facility] INVASIVE CV LAB;  Service: Cardiovascular;  Laterality: N/A;   TEE WITHOUT CARDIOVERSION N/A 06/14/2017   Procedure: TRANSESOPHAGEAL ECHOCARDIOGRAM (TEE);  Surgeon: Donata Clay, Theron Arista, MD;  Location: [facility] OR;  Service: Open Heart Surgery;  Laterality: N/A;   URETHROPLASTY N/A 10/15/2017   Procedure: URETHROPLASTY WITH BUCCAL GRAFT HARVAST;  Surgeon: Crist Fat, MD;  Location: [facility] ORS;  Service: Urology;  Laterality: N/A;    Allergies  Allergen Reactions   Ezetimibe Other (See Comments)    Joint pain and drops BP   Niacin Other (See Comments)    Drops BP and severe   Statins Other (See Comments)    Severe joint pain and drops BP.    BP 118/74    Ht 6\' 1"  (1.854 m)    Wt 228 lb (103.4 kg)    BMI 30.08 kg/m   [facility] 11/20/2020  Frequency of aerobic exercise (# of days/week) 5  Average time in minutes 20  Frequency of strengthening activities (# of days/week) 0    No flowsheet data found.      Objective:  Physical Exam:  Gen: Well-appearing, in no acute distress; non-toxic CV: Regular Rate. Well-perfused. Warm.  Resp: Breathing unlabored on room air; no wheezing. Psych: Fluid speech in conversation; appropriate affect; normal thought process Neuro: Sensation intact throughout. No gross coordination deficits.  MSK:  - Right knee: Trace effusion of the knee; no erythema, ecchymosis.  There is some medial joint line and lateral joint line TTP.  There is pain with full extension of the knee but can get passively to 0 degrees, flexion to 120 degrees.  No varus or valgus instability, negative Lachman's test.   Strength 5/5 in all directions.  Negative modified McMurray's testing.  Right hip: Improved strength with hip abduction testing  Left hip/glute region: + TTP within the mid belly of the piriformis muscle.  There is no SI joint TTP, no TTP over the greater trochanter.  Inspection yields no erythema, ecchymosis or swelling.  There is some pain with external rotation about the hip.  Full range of motion intact.  No pain or weakness with resisted hamstring testing.   Assessment & Plan:  1. Right knee pain - acute on chronic with moderate joint space OA 2. Right greater trochanteric bursitis - improved 3. Left piriformis syndrome - acute  Procedure: Right knee injection After informed written consent timeout was performed, patient was seated on exam table. The patient's right knee was prepped with Betadine and alcohol swab and utilizing anteromedial approach, the patient's knee was injected intraarticularly with 4:1 lidocaine: depomedrol. Patient tolerated the procedure well without immediate complications.  Procedure: IM Toradol injection 30mg  (89ml) was provided into the left superolateral gluteus muscle. Tolerated well.  Plan:  -Given the exacerbation of his knee pain, through shared decision making we elected to proceed with corticosteroid injection to the right knee -Patient may ice, take Tylenol for any postinjection pain -Discussed continuing quad exercises as well as hip stabilization exercises for the right lower extremity for the hip and knee -IM Toradol injection provided due to the acute pain of his piriformis syndrome.  Home exercises were provided and demonstrated for the patient today for the piriformis syndrome.  He may heat/ice as well. -Follow-up as needed  , DO PGY-4, Sports Medicine Fellow [facility]  Addendum:  I was the preceptor for this visit and available for immediate consultation.  [name] MD [facility]

## 2021-01-28 NOTE — Progress Notes (Signed)
Remote ICD transmission.   

## 2021-02-04 DIAGNOSIS — E7800 Pure hypercholesterolemia, unspecified: Secondary | ICD-10-CM | POA: Diagnosis not present

## 2021-02-04 DIAGNOSIS — M1990 Unspecified osteoarthritis, unspecified site: Secondary | ICD-10-CM

## 2021-02-04 DIAGNOSIS — I10 Essential (primary) hypertension: Secondary | ICD-10-CM

## 2021-02-04 DIAGNOSIS — I509 Heart failure, unspecified: Secondary | ICD-10-CM

## 2021-02-04 DIAGNOSIS — E785 Hyperlipidemia, unspecified: Secondary | ICD-10-CM

## 2021-02-12 ENCOUNTER — Other Ambulatory Visit: Payer: Self-pay | Admitting: Medical

## 2021-02-13 ENCOUNTER — Telehealth: Payer: Self-pay | Admitting: Medical

## 2021-02-13 NOTE — Telephone Encounter (Signed)
Got pt scheduled 

## 2021-02-13 NOTE — Telephone Encounter (Signed)
Get him on the schedule now for a med check

## 2021-02-24 DIAGNOSIS — S32009K Unspecified fracture of unspecified lumbar vertebra, subsequent encounter for fracture with nonunion: Secondary | ICD-10-CM | POA: Diagnosis not present

## 2021-02-24 DIAGNOSIS — I10 Essential (primary) hypertension: Secondary | ICD-10-CM | POA: Diagnosis not present

## 2021-02-24 DIAGNOSIS — Z6829 Body mass index (BMI) 29.0-29.9, adult: Secondary | ICD-10-CM | POA: Diagnosis not present

## 2021-02-24 DIAGNOSIS — M9983 Other biomechanical lesions of lumbar region: Secondary | ICD-10-CM | POA: Diagnosis not present

## 2021-03-06 ENCOUNTER — Other Ambulatory Visit: Payer: Self-pay | Admitting: Medical

## 2021-03-06 NOTE — Telephone Encounter (Signed)
[facility] requesting to fill pt ambien. Please advise [place] ?

## 2021-03-10 ENCOUNTER — Other Ambulatory Visit: Payer: Self-pay

## 2021-03-10 ENCOUNTER — Ambulatory Visit (INDEPENDENT_AMBULATORY_CARE_PROVIDER_SITE_OTHER): Payer: PPO | Admitting: Medical

## 2021-03-10 VITALS — BP 130/88 | HR 89 | Wt 223.6 lb

## 2021-03-10 DIAGNOSIS — R5383 Other fatigue: Secondary | ICD-10-CM

## 2021-03-10 DIAGNOSIS — Z951 Presence of aortocoronary bypass graft: Secondary | ICD-10-CM | POA: Diagnosis not present

## 2021-03-10 DIAGNOSIS — G4700 Insomnia, unspecified: Secondary | ICD-10-CM

## 2021-03-10 DIAGNOSIS — R7301 Impaired fasting glucose: Secondary | ICD-10-CM | POA: Diagnosis not present

## 2021-03-10 DIAGNOSIS — R7989 Other specified abnormal findings of blood chemistry: Secondary | ICD-10-CM | POA: Diagnosis not present

## 2021-03-10 DIAGNOSIS — I2583 Coronary atherosclerosis due to lipid rich plaque: Secondary | ICD-10-CM

## 2021-03-10 DIAGNOSIS — E7800 Pure hypercholesterolemia, unspecified: Secondary | ICD-10-CM

## 2021-03-10 DIAGNOSIS — I5022 Chronic systolic (congestive) heart failure: Secondary | ICD-10-CM | POA: Diagnosis not present

## 2021-03-10 DIAGNOSIS — I10 Essential (primary) hypertension: Secondary | ICD-10-CM | POA: Diagnosis not present

## 2021-03-10 DIAGNOSIS — I2510 Atherosclerotic heart disease of native coronary artery without angina pectoris: Secondary | ICD-10-CM

## 2021-03-10 MED ORDER — ZOLPIDEM TARTRATE 10 MG PO TABS: 10.0000 mg | ORAL_TABLET | Freq: Every day | ORAL | 3 refills | Status: DC

## 2021-03-10 NOTE — Progress Notes (Signed)
Subjective: ?Chief Complaint  ?Patient presents with  ? med check  ?  Med check- having flare in sinuses from pollen.   ? ?Here for med check ? ?Insomnia-she uses Ambien regularly.  He tried both [place] and [place] about a year ago and neither worked.  He notes 2 sleep studies in the past and neither were conclusive.  He travel somewhat frequently.  Without medication he does not seem to sleep well at all ? ?Hyperlipidemia-compliant with Repatha every 2 weeks without complaint ? ?Sees cardiology, compliant medications for heart and hypertension ? ?Impaired glucose-back several months ago we had recommended Jardiance as well as cardiology recommended Jardiance.  He took it briefly then stopped it due to some side effect that he cannot remember. ? ?He is nonfasting today.  He notes that he just got back from a vacation in [place], was there about 17 days.  He refuses labs today to recheck blood sugars ? ?He does note low energy.  Last visit we checked a testosterone level which was low.  He wants to revisit this ? ?He saw the pharmacist as part of chronic care management recently.  She recommend he check his pressures more regularly.  He notes that his numbers typically are in the 130/80 range at home ? ?In the past he has not tolerated fluid pills/diuretics. ? ?No other aggravating or relieving factors. No other complaint. ? ?Past Medical History:  ?Diagnosis Date  ? AICD (automatic cardioverter/defibrillator) present   ? Anxiety   ? Arthritis   ? "mild; back, neck, spine" (06/09/2017)  ? CHF (congestive heart failure) (HCC)   ? Chronic back pain   ? Coronary artery disease   ? Dyspnea   ? GERD (gastroesophageal reflux disease)   ? History of gout   ? History of kidney stones X 2  ? Hypercholesterolemia   ? Hypertension   ? LBBB (left bundle branch block)   ? MI (myocardial infarction) (HCC) 1993  ? LATERAL  ? MI (myocardial infarction) (HCC) ?09/2001; 06/07/2017  ? Pneumonia   ? "several times" (06/09/2017)  ? S/P  coronary artery stent placement   ? RCA  ? Varicose veins   ? ?Current Outpatient Medications on File Prior to Visit  ?Medication Sig Dispense Refill  ? acetaminophen (TYLENOL) 500 MG tablet Take 1,000 mg by mouth in the morning and at bedtime. Morning & noon    ? aspirin 81 MG EC tablet Take 81 mg by mouth in the morning.    ? carvedilol (COREG) 6.25 MG tablet Take 0.5 tablets (3.125 mg total) by mouth 2 (two) times daily with a meal. Must have office visit for additional refills [phone] 60 tablet 0  ? ENTRESTO 24-26 MG TAKE 1/2 TABLET BY MOUTH 2 TIMES DAILY 90 tablet 2  ? esomeprazole (NEXIUM) 20 MG capsule TAKE 1 CAPSULE BY MOUTH EVERY DAY 90 capsule 2  ? Glucosamine-Chondroitin (OSTEO BI-FLEX REGULAR STRENGTH PO) Take 1 tablet by mouth 2 (two) times daily. Morning & noon    ? loratadine (CLARITIN) 10 MG tablet Take 10 mg by mouth daily.    ? Multiple Vitamin (MULTIVITAMIN WITH MINERALS) TABS tablet Take 1 tablet by mouth in the morning and at bedtime. [employer]    ? REPATHA SURECLICK 140 MG/ML SOAJ INJECT INTO SKIN EVERY 14 DAYS 2 mL 11  ? zolpidem (AMBIEN) 10 MG tablet TAKE 1 TABLET BY MOUTH AT BEDTIME 30 tablet 0  ? ?No current facility-administered medications on file prior to visit.  ? ? ?  ROS as in subjective ? ? ?Objective: ?Gen: wd, wn, nad ?Psych: Pleasant, answers questions appropriately ? ? ? ?Assessment: ?Encounter Diagnoses  ?Name Primary?  ? Insomnia, unspecified type Yes  ? Primary hypertension   ? S/P CABG x 4   ? Coronary artery disease due to lipid rich plaque   ? Chronic systolic heart failure (HCC)   ? Fatigue, unspecified type   ? Impaired fasting blood sugar   ? Low testosterone   ? Hypercholesterolemia   ? ? ? ? ?Plan: ?Insomnia-prior trials of Belsomra and Lunesta did not help.  He seems to be okay with Ambien.  Discussed risk and benefits and proper use of medication. ? ?Hypertension-status post CABG, CAD, chronic systolic heart failure-managed by cardiology.  Looking back in the  record he tried [place] briefly last year but stopped it.  He cannot recall what the side effect was but based on his conversation I do not think he wanted to add any other medications ? ?Fatigue, low testosterone-he may want to pursue further evaluation and treatment for low testosterone.  He is going to check his insurance first to see if they even cover testosterone therapy before moving forward.  He can return for labs at his convenience ? ?Impaired glucose-we discussed that he is prediabetic.  We discussed the importance of trying to prevent progression to diabetes.  We discussed the benefit of the medicine like Jardiance which was recommended by cardiology as well.  He does not seem too keen on adding another medication.  We discussed the benefits to blood sugar, weight and heart with a medicine like Jardiance or even a GLP-1 medication.  He declines labs today.  He will come back for labs on a later time to check his testosterone as well.  Discussed importance of low sugar diet.  Counseled on healthy low sugar diet ? ?CAD, hyperlipidemia-continue Repatha ? ?[name] was seen today for med check. ? ?Diagnoses and all orders for this visit: ? ?Insomnia, unspecified type ? ?Primary hypertension ? ?S/P CABG x 4 ? ?Coronary artery disease due to lipid rich plaque ? ?Chronic systolic heart failure (HCC) ? ?Fatigue, unspecified type ? ?Impaired fasting blood sugar ?-     Hemoglobin A1c; Future ? ?Low testosterone ?-     Testosterone; Future ?-     FSH/LH; Future ?-     Prolactin; Future ? ?Hypercholesterolemia ? ? ? ?F/u pending recheck for labs. ? ? ?

## 2021-03-10 NOTE — Patient Instructions (Signed)
Call insurance to see which testosterone products are covered on your plan ? ?Examples includes: ? ?Testosterone Cypionate 200mg  injection every 3-4 weeks ?Jatenzo oral testosterone ?Natesto nasal spray ?Topical gel Androderm ?Topical gel Axiron ?Topical gel Androgel ?Topical gel Fortesta ? ?If your insurance doesn't cover any testosterone product, a local pharmacy compounds creams for testosterone, and the cost is likely around $50-90 per month ? ? ? ? ?

## 2021-04-02 ENCOUNTER — Telehealth: Payer: Self-pay | Admitting: Cardiology

## 2021-04-02 NOTE — Telephone Encounter (Signed)
Patient is returning phone call.  °

## 2021-04-02 NOTE — Telephone Encounter (Signed)
Pt called to report that he has been having increased fatigue with minimal exertion. He says it has been worsening over the past month.. he denies chest pain, no palpitations. No dizziness... but he says he has end of the day dependent edema and he says his watch says he has "inconclusive" rhythm... he says his BP has been "good"...  ? ?He has been eating and drinking well and not recent h/o acute illness.  ? ?I made the pot an appt ina few weeks with Dr. Swaziland. His PCP has ordered some labs.. mostly hormone related labs.  ? ?I will forward to the device nurses to be sure the pts ICD is not recording any AFIB or any other reasons for his symptoms.  ?

## 2021-04-02 NOTE — Telephone Encounter (Signed)
I spoke with the patient and he agreed to send a transmission with his home monitor when he get home. He states it may be an hour when he get there. I let the patient know that we closed at 5 pm and the nurse will not be able to see the transmission until tomorrow morning. ?

## 2021-04-02 NOTE — Telephone Encounter (Signed)
Left message for pt to call.

## 2021-04-02 NOTE — Telephone Encounter (Signed)
Patient called stating the for the past month he has been struggling,  he has no energy.  He went to his PCP and they couldn't find anything wrong with him.  He feels that his oxygen level is low.  ?

## 2021-04-03 NOTE — Telephone Encounter (Addendum)
Transmission reviewed no episodes, normal device function, patient AS/VP 99% of the time, therefore cannot turn on rate response as patients own sinus node is driving rates, optivol does not show fluid accumulation, no episodes.  ? ?Spoke with patient informed him of normal device function  ?

## 2021-04-04 ENCOUNTER — Other Ambulatory Visit: Payer: PPO

## 2021-04-04 DIAGNOSIS — R7301 Impaired fasting glucose: Secondary | ICD-10-CM | POA: Diagnosis not present

## 2021-04-04 DIAGNOSIS — R7989 Other specified abnormal findings of blood chemistry: Secondary | ICD-10-CM | POA: Diagnosis not present

## 2021-04-05 LAB — FSH/LH
FSH: 9.8 m[IU]/mL (ref 1.5–12.4)
LH: 5.9 m[IU]/mL (ref 1.7–8.6)

## 2021-04-05 LAB — PROLACTIN: Prolactin: 13.8 ng/mL (ref 4.0–15.2)

## 2021-04-05 LAB — HEMOGLOBIN A1C
Est. average glucose Bld gHb Est-mCnc: 131 mg/dL
Hgb A1c MFr Bld: 6.2 % — ABNORMAL HIGH (ref 4.8–5.6)

## 2021-04-05 LAB — TESTOSTERONE: Testosterone: 219 ng/dL — ABNORMAL LOW (ref 264–916)

## 2021-04-07 ENCOUNTER — Telehealth: Payer: Self-pay | Admitting: Pharmacist

## 2021-04-07 NOTE — Chronic Care Management (AMB) (Signed)
? ? ?Chronic Care Management ?Pharmacy Assistant  ? ?Name: [name]  MRN: [id number] DOB: August 26, 1955 ? ?Reason for Encounter: Disease State ?  ?Conditions to be addressed/monitored: ?HTN ? ?Recent office visits:  ?3/6/2 [name] Canavan, PA-C - Patient presented for Insomnia unspecified and other concerns. Stopped Diclofenac, Furosemide and Nitroglycerin.  ? ?Recent consult visits:  ?02/24/21 Patient presented to [facility] for Essential hypertension and other concerns. No medication changes. ? ?01/22/21 [name] Brunner, DO - Patient presented for Greater trochanteric pain syndrome of right lower extremity and other concerns. No medication changes. ? ?01/16/21 [name] (Cardiology) - Patient presented for Ischemic cardiomyopathy. No other visit details available. ? ?Hospital visits:  ?Medication Reconciliation was completed by comparing discharge summary, patient?s EMR and Pharmacy list, and upon discussion with patient. ?  ?Patient presented to [facility] on 10/03/20 due to Elective Surgery. Patient was there for 3 days. ?  ?New?Medications Started at [facility] Discharge:?? ?-started  ?HYDROcodone-acetaminophen (NORCO) ?methocarbamol (ROBAXIN) ?  ?Medication Changes at Hospital Discharge: ?-Changed  ?None ?  ?Medications Discontinued at Hospital Discharge: ?-Stopped  ?None ?  ?Medications that remain the same after Hospital Discharge:??  ?-All other medications will remain the same.   ?  ?Medication Reconciliation was completed by comparing discharge summary, patient?s EMR and Pharmacy list, and upon discussion with patient. ?  ?Patient presented to [facility] on 07/31/20 due to Non specific Chest pain and other concerns. Patient was there for 6 hours. ?  ?New?Medications Started at [facility] Discharge:?? ?-started  ?None ?  ?Medication Changes at Hospital Discharge: ?-Changed  ?None ?  ?Medications Discontinued at Hospital Discharge: ?-Stopped  ?None ?   ?Medications that remain the same after Hospital Discharge:??  ?-All other medications will remain the same.   ? ?Medications: ?Outpatient Encounter Medications as of 04/07/2021  ?Medication Sig  ? acetaminophen (TYLENOL) 500 MG tablet Take 1,000 mg by mouth in the morning and at bedtime. Morning & noon  ? aspirin 81 MG EC tablet Take 81 mg by mouth in the morning.  ? carvedilol (COREG) 6.25 MG tablet Take 0.5 tablets (3.125 mg total) by mouth 2 (two) times daily with a meal. Must have office visit for additional refills [phone]  ? ENTRESTO 24-26 MG TAKE 1/2 TABLET BY MOUTH 2 TIMES DAILY  ? esomeprazole (NEXIUM) 20 MG capsule TAKE 1 CAPSULE BY MOUTH EVERY DAY  ? Glucosamine-Chondroitin (OSTEO BI-FLEX REGULAR STRENGTH PO) Take 1 tablet by mouth 2 (two) times daily. Morning & noon  ? loratadine (CLARITIN) 10 MG tablet Take 10 mg by mouth daily.  ? Multiple Vitamin (MULTIVITAMIN WITH MINERALS) TABS tablet Take 1 tablet by mouth in the morning and at bedtime. [employer]  ? REPATHA SURECLICK 140 MG/ML SOAJ INJECT INTO SKIN EVERY 14 DAYS  ? zolpidem (AMBIEN) 10 MG tablet Take 1 tablet (10 mg total) by mouth at bedtime.  ? ?No facility-administered encounter medications on file as of 04/07/2021.  ?Reviewed chart prior to disease state call. Spoke with patient regarding BP ? ?Recent Office Vitals: ?BP Readings from Last 3 Encounters:  ?03/10/21 130/88  ?01/22/21 118/74  ?12/18/20 132/86  ? ?Pulse Readings from Last 3 Encounters:  ?03/10/21 89  ?11/14/20 95  ?10/06/20 71  ?  ?Wt Readings from Last 3 Encounters:  ?03/10/21 223 lb 9.6 oz (101.4 kg)  ?01/22/21 228 lb (103.4 kg)  ?12/18/20 228 lb (103.4 kg)  ?  ? ?Kidney Function ?Lab Results  ?  Component Value Date/Time  ? CREATININE 0.99 11/14/2020 09:53 AM  ? CREATININE 0.89 10/04/2020 03:30 AM  ? CREATININE 1.13 02/14/2016 08:13 AM  ? CREATININE 1.15 08/30/2014 03:48 PM  ? GFR 56.51 (L) 06/08/2013 08:41 AM  ? GFRNONAA >60 10/04/2020 03:30 AM  ? GFRAA 104 12/05/2019 09:31  AM  ? ? ? ?  Latest Ref Rng & Units 11/14/2020  ?  9:53 AM 10/04/2020  ?  3:30 AM 10/03/2020  ? 10:23 PM  ?BMP  ?Glucose 70 - 99 mg/dL 161   096     ?BUN 8 - 27 mg/dL 16   20     ?Creatinine 0.76 - 1.27 mg/dL 0.45   4.09   8.11    ?BUN/Creat Ratio 10 - 24 16      ?Sodium 134 - 144 mmol/L 140   135     ?Potassium 3.5 - 5.2 mmol/L 4.7   4.6     ?Chloride 96 - 106 mmol/L 101   99     ?CO2 20 - 29 mmol/L 27   27     ?Calcium 8.6 - 10.2 mg/dL 9.8   8.6     ? ? ?Current antihypertensive regimen:  ?Carvedilol 6.25 mg 1/2 tablet twice daily - appropriate, query effective ?Entresto 24-26 mg 1/2 tablet daily daily - appropriate, query effective ? ?Adherence Review: ?Is the patient currently on ACE/ARB medication? No ?Does the patient have >5 day gap between last estimated fill dates? No ? ?Unable to reach for assessment ? ?Care Gaps: ?Zoster Vaccine - Overdue ?Bp - 118/74 (01/22/21) ?AWV- 8/22 ?CCM- 7/23 ? ?Star Rating Drugs: ?None ? ?Pamala Duffel CMA ?Clinical Pharmacist Assistant ?[phone] ? ?

## 2021-04-08 ENCOUNTER — Telehealth: Payer: Self-pay | Admitting: Internal Medicine

## 2021-04-08 ENCOUNTER — Other Ambulatory Visit: Payer: Self-pay | Admitting: Medical

## 2021-04-08 MED ORDER — TESTOSTERONE CYPIONATE 200 MG/ML IJ SOLN: 200.0000 mg | INTRAMUSCULAR | 0 refills | Status: DC

## 2021-04-08 NOTE — Telephone Encounter (Signed)
Pt called and states that Testosterone cyptinate injection IM is covered with P.A. he can get a 90 days supply for $24. Please send in ?

## 2021-04-08 NOTE — Progress Notes (Signed)
Med sent. Schedule visit to discuss results, medication use, demon and 1st injection. ?

## 2021-04-09 NOTE — Telephone Encounter (Signed)
Pt was advised to schedule an appointment to discuss this, but he declined and did not want to come in. He needs the medication but I advised him that PA would have to be done first and I could not guarantee how long that would take- pt was frustrated and said he would fine something else to do.  ?  ?Also he said he was having swelling and you did not care to take a look at it. Pt then said bye and hung up ?

## 2021-04-09 NOTE — Telephone Encounter (Signed)
Pt was advised to schedule an appointment to discuss this, but he declined and did not want to come in. He needs the medication but I advised him that PA would have to be done first and I could not guarantee how long that would take- pt was frustrated and said he would fine something else to do.  ?  ?Also he said he was having swelling and you did not care to take a look at it. Pt then said bye and hung up ?

## 2021-04-10 NOTE — Telephone Encounter (Signed)
Left message for pt to call me back 

## 2021-04-10 NOTE — Progress Notes (Signed)
?Cardiology Office Note:   ? ?Date:  04/16/2021  ? ?ID:  CAPRI SADLIER, DOB Jul 19, 1955, MRN [id number] ? ?PCP:  Carlena Hurl, PA-C  ?Cardiologist:  Laney Bagshaw Martinique, MD  ?Electrophysiologist:  Virl Axe, MD  ? ?Referring MD: Carlena Hurl, PA-C  ? ?Chief Complaint  ?Patient presents with  ? Coronary Artery Disease  ? Congestive Heart Failure  ? ? ? ?History of Present Illness:   ? ?[name] is a 66 y.o. male with a hx of CAD s/p CABG, hypertension, left bundle branch block, hyperlipidemia, and chronic systolic heart failure.  Patient was admitted in June 2019 with flash pulmonary edema.  He was ruled in for NSTEMI and underwent cardiac catheterization which showed severe three-vessel CAD. He underwent CABG by Dr Prescott Gum with LIMA to LAD, SVG to diagonal, SVG to ramus intermediate, and SVG to RCA.  He had a syncope in June 2019 while waiting in the neurology office due to urethral stricture.  He was noted to have wide-complex tachycardia on telemetry with heart rate of 170.  EF was 35 to 40%.  Lower extremity Doppler was negative for DVT.  CT of the chest was negative for PE.  He was seen by EP, the underlying rhythm could be either VT versus NSVT.  He was fitted with LifeVest.  Medication was cut back due to mild hypotension and orthostasis.  Repeat echocardiogram in September 2019 showed persistently low EF of 25 to 30%.  He was unable to tolerate Entresto, spironolactone and carvedilol due to orthostatic symptom and the lightheadedness.  Patient eventually underwent BIV ICD implantation in March 2020 by Dr. Caryl Comes after failing another trial of medical therapy.  Since then, repeat echocardiogram in July 2020 did show significant improvement in EF to 40 to 45%. ? ? ?Was seen by EP on 08/14/20.  Noted persistent chest pain. Also notes increased dyspnea. Mild edema. Was given lasix which he has taken a couple of times. Myoview ordered. This showed EF drop to 17% with large inferolateral scar and  anteroapical ischemia. Repeat Echo is showed EF 40%.  Cardiac cath was done. This showed all grafts patent except for SVG to ramus which was occluded.  Right heart pressures, LV filling pressures and CO were normal. Recommended addition of SGLT 2 inhibitor. He did have lumbar surgery in September 2022.  ?Last device check in January looked good.  ? ?On follow up today he notes his weight has been variable. Notes more swelling in his feet and ankles. Complains of back pain for the past 2 weeks similar to when he had a kidney infection before. He complains he has no energy and is worried he has systemic inflammation. He is eating very healthy now and exercising regularly.  ? ? ?Past Medical History:  ?Diagnosis Date  ? AICD (automatic cardioverter/defibrillator) present   ? Anxiety   ? Arthritis   ? "mild; back, neck, spine" (06/09/2017)  ? CHF (congestive heart failure) ([place])   ? Chronic back pain   ? Coronary artery disease   ? Dyspnea   ? GERD (gastroesophageal reflux disease)   ? History of gout   ? History of kidney stones X 2  ? Hypercholesterolemia   ? Hypertension   ? LBBB (left bundle branch block)   ? MI (myocardial infarction) ([place]) 1993  ? LATERAL  ? MI (myocardial infarction) ([place]) ?09/2001; 06/07/2017  ? Pneumonia   ? "several times" (06/09/2017)  ? S/P coronary artery stent placement   ?  RCA  ? Varicose veins   ? ? ?Past Surgical History:  ?Procedure Laterality Date  ? ANTERIOR CERVICAL DECOMP/DISCECTOMY FUSION  03/03/2011  ? Procedure: ANTERIOR CERVICAL DECOMPRESSION/DISCECTOMY FUSION 3 LEVELS;  Surgeon: Floyce Stakes, MD;  [facility]: [facility];  Service: Neurosurgery;  Laterality: N/A;  Cervical three-four Cervical four-five Cervical five-six Anterior cervical decompression/diskectomy, fusion  ? APPLICATION OF ROBOTIC ASSISTANCE FOR SPINAL PROCEDURE N/A 10/03/2020  ? Procedure: APPLICATION OF ROBOTIC ASSISTANCE FOR SPINAL PROCEDURE;  Surgeon: Consuella Lose, MD;  Location: [place];  Service:  Neurosurgery;  Laterality: N/A;  ? BACK SURGERY    ? CARDIAC CATHETERIZATION  2009  ? Stents in RCA patent. 70 to 80% PL, and 50% ostial PD.   ? CARDIAC CATHETERIZATION N/A 11/20/2014  ? Procedure: Left Heart Cath and Coronary Angiography;  Surgeon: Hazelene Doten M Martinique, MD;  Location: [place] [facility];  Service: Cardiovascular;  Laterality: N/A;  ? CORONARY ANGIOPLASTY    ? DIRECT ANGIOPLASTY THE MARGINAL BRANCH  ? CORONARY ANGIOPLASTY WITH STENT PLACEMENT    ? RCA  ? CORONARY ARTERY BYPASS GRAFT N/A 06/14/2017  ? Procedure: CORONARY ARTERY BYPASS GRAFTING (CABG) x four, using left internal mammary artery and right leg greater saphenous vein harvested endoscopically;  Surgeon: Ivin Poot, MD;  Location: [place];  Service: Open Heart Surgery;  Laterality: N/A;  ? CYSTOSCOPY N/A 06/14/2017  ? Procedure: CYSTOSCOPY;  Surgeon: Ivin Poot, MD;  Location: [place];  Service: Open Heart Surgery;  Laterality: N/A;  ? ICD IMPLANT N/A 03/11/2018  ? Procedure: ICD IMPLANT - Dual Chamber;  Surgeon: Deboraha Sprang, MD;  Location: [place] [facility];  Service: Cardiovascular;  Laterality: N/A;  ? INSERTION OF SUPRAPUBIC CATHETER N/A 06/14/2017  ? Procedure: INSERTION OF SUPRAPUBIC CATHETER - Lower abdomen;  Surgeon: Ivin Poot, MD;  Location: [place];  Service: Open Heart Surgery;  Laterality: N/A;  ? POSTERIOR LUMBAR FUSION  10/2011  ? RIGHT/LEFT HEART CATH AND CORONARY ANGIOGRAPHY N/A 06/08/2017  ? Procedure: RIGHT/LEFT HEART CATH AND CORONARY ANGIOGRAPHY;  Surgeon: Martinique, Adaria Hole M, MD;  Location: [place] [facility];  Service: Cardiovascular;  Laterality: N/A;  ? RIGHT/LEFT HEART CATH AND CORONARY/GRAFT ANGIOGRAPHY N/A 09/06/2020  ? Procedure: RIGHT/LEFT HEART CATH AND CORONARY/GRAFT ANGIOGRAPHY;  Surgeon: Martinique, Joanne Brander M, MD;  Location: [place] [facility];  Service: Cardiovascular;  Laterality: N/A;  ? TEE WITHOUT CARDIOVERSION N/A 06/14/2017  ? Procedure: TRANSESOPHAGEAL ECHOCARDIOGRAM (TEE);  Surgeon: Prescott Gum, Collier Salina, MD;   Location: [place];  Service: Open Heart Surgery;  Laterality: N/A;  ? URETHROPLASTY N/A 10/15/2017  ? Procedure: URETHROPLASTY WITH BUCCAL GRAFT HARVAST;  Surgeon: Ardis Hughs, MD;  Location: [facility];  Service: Urology;  Laterality: N/A;  ? ? ?Current Medications: ?Current Meds  ?Medication Sig  ? acetaminophen (TYLENOL) 500 MG tablet Take 1,000 mg by mouth in the morning and at bedtime. Morning & noon  ? aspirin 81 MG EC tablet Take 81 mg by mouth in the morning.  ? carvedilol (COREG) 6.25 MG tablet Take 0.5 tablets (3.125 mg total) by mouth 2 (two) times daily with a meal. Must have office visit for additional refills [phone]  ? empagliflozin (JARDIANCE) 10 MG TABS tablet Take 1 tablet (10 mg total) by mouth daily before breakfast.  ? ENTRESTO 24-26 MG TAKE 1/2 TABLET BY MOUTH 2 TIMES DAILY  ? esomeprazole (NEXIUM) 20 MG capsule TAKE 1 CAPSULE BY MOUTH EVERY DAY  ? furosemide (LASIX) 40 MG tablet Take 1  tablet (40 mg total) by mouth daily.  ? Glucosamine-Chondroitin (OSTEO BI-FLEX REGULAR STRENGTH PO) Take 1 tablet by mouth 2 (two) times daily. Morning & noon  ? loratadine (CLARITIN) 10 MG tablet Take 10 mg by mouth daily.  ? Multiple Vitamin (MULTIVITAMIN WITH MINERALS) TABS tablet Take 1 tablet by mouth in the morning and at bedtime. [place] [employer]  ? REPATHA SURECLICK [id number] MG/ML SOAJ INJECT INTO SKIN EVERY 14 DAYS  ? Testosterone Cypionate 200 MG/ML SOLN Inject 200 mg as directed every 30 (thirty) days.  ? zolpidem (AMBIEN) 10 MG tablet Take 1 tablet (10 mg total) by mouth at bedtime.  ?  ? ?Allergies:   Ezetimibe, Niacin, and Statins  ? ?Social History  ? ?Socioeconomic History  ? Marital status: Married  ?  Spouse name: Not on file  ? Number of children: 1  ? Years of education: Not on file  ? Highest education level: Not on file  ?Occupational History  ? 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 Smoking status: Former  ?  Packs/day: 2.00  ?  Years: 20.00  ?  Pack years: 40.00  ?  Types: Cigarettes  ?   Quit date: 05/07/1999  ?  Years since quitting: 21.9  ? Smokeless tobacco: Never  ?Vaping Use  ? Vaping Use: Never used  ?Substance and Sexual Activity  ? Alcohol use: Not Currently  ?  Alcohol/week: 1.0 standard drink  ?

## 2021-04-10 NOTE — Telephone Encounter (Signed)
Pt was notified and pt states he picked up testosterone 04/09/21 and gave himself a shot. He has been giving himself shots for years so he knows how to do it. Vernona Rieger got a prior auth today 04/10/21 for testosterone but pt said he picked it up already.  ? ?Also he states that you looked at your computer the whole time and never once looked at his swelling in Feet (not hands) and they were painful. He said he came in to discuss sugar issues and testosterone got brought up. He is just struggling with everything and don't know what to do as you haven't listened to him. I advised pt that he would need to follow-up for testosterone before running out- which he said he would schedule. As far as the swelling I advised him we could see him today for a visit but hes not in town, he will talk to cardiology about the swelling next week.  ? ?Also advised pt if he was unhappy here, that he could switch to another office.  ?

## 2021-04-14 ENCOUNTER — Telehealth: Payer: Self-pay | Admitting: Internal Medicine

## 2021-04-14 NOTE — Telephone Encounter (Signed)
I have started a P.A for Testosterone injection for patient  ?

## 2021-04-14 NOTE — Telephone Encounter (Signed)
PA for testosterone has been denied due to patient not having a dx of hypogondism. Please advise ?

## 2021-04-16 ENCOUNTER — Ambulatory Visit (INDEPENDENT_AMBULATORY_CARE_PROVIDER_SITE_OTHER): Payer: PPO | Admitting: Cardiology

## 2021-04-16 ENCOUNTER — Encounter: Payer: Self-pay | Admitting: Cardiology

## 2021-04-16 VITALS — BP 120/80 | HR 85 | Ht 74.0 in | Wt 243.4 lb

## 2021-04-16 DIAGNOSIS — I10 Essential (primary) hypertension: Secondary | ICD-10-CM

## 2021-04-16 DIAGNOSIS — I25708 Atherosclerosis of coronary artery bypass graft(s), unspecified, with other forms of angina pectoris: Secondary | ICD-10-CM

## 2021-04-16 DIAGNOSIS — I447 Left bundle-branch block, unspecified: Secondary | ICD-10-CM | POA: Diagnosis not present

## 2021-04-16 DIAGNOSIS — I5022 Chronic systolic (congestive) heart failure: Secondary | ICD-10-CM

## 2021-04-16 DIAGNOSIS — Z951 Presence of aortocoronary bypass graft: Secondary | ICD-10-CM | POA: Diagnosis not present

## 2021-04-16 DIAGNOSIS — I255 Ischemic cardiomyopathy: Secondary | ICD-10-CM

## 2021-04-16 MED ORDER — EMPAGLIFLOZIN 10 MG PO TABS: 10.0000 mg | ORAL_TABLET | Freq: Every day | ORAL | 3 refills | Status: DC

## 2021-04-16 MED ORDER — FUROSEMIDE 40 MG PO TABS: 40.0000 mg | ORAL_TABLET | Freq: Every day | ORAL | 3 refills | Status: DC

## 2021-04-16 NOTE — Patient Instructions (Signed)
We will check a urine for culture ? ?We will look into patient assistance for [place] and either [place] or Jardiance, ? ?Start lasix 40 mg  daily ? ? ? ?

## 2021-04-17 ENCOUNTER — Ambulatory Visit (INDEPENDENT_AMBULATORY_CARE_PROVIDER_SITE_OTHER): Payer: PPO

## 2021-04-17 DIAGNOSIS — I447 Left bundle-branch block, unspecified: Secondary | ICD-10-CM | POA: Diagnosis not present

## 2021-04-17 LAB — URINALYSIS
Bilirubin, UA: NEGATIVE
Glucose, UA: NEGATIVE
Ketones, UA: NEGATIVE
Nitrite, UA: NEGATIVE
Protein,UA: NEGATIVE
RBC, UA: NEGATIVE
Specific Gravity, UA: 1.015 (ref 1.005–1.030)
Urobilinogen, Ur: 1 mg/dL (ref 0.2–1.0)
pH, UA: 6 (ref 5.0–7.5)

## 2021-04-17 LAB — CUP PACEART REMOTE DEVICE CHECK
Battery Remaining Longevity: 16 mo
Battery Voltage: 2.91 V
Brady Statistic AP VP Percent: 0.17 %
Brady Statistic AP VS Percent: 0.07 %
Brady Statistic AS VP Percent: 98.7 %
Brady Statistic AS VS Percent: 1.06 %
Brady Statistic RA Percent Paced: 0.24 %
Brady Statistic RV Percent Paced: 98.4 %
Date Time Interrogation Session: 20230413043723
HighPow Impedance: 74 Ohm
Implantable Lead Implant Date: 20190610
Implantable Lead Implant Date: 20200306
Implantable Lead Implant Date: 20200306
Implantable Lead Location: 753858
Implantable Lead Location: 753859
Implantable Lead Location: 753860
Implantable Lead Model: 5071
Implantable Lead Model: 5076
Implantable Pulse Generator Implant Date: 20200306
Lead Channel Impedance Value: 285 Ohm
Lead Channel Impedance Value: 380 Ohm
Lead Channel Impedance Value: 4047 Ohm
Lead Channel Impedance Value: 4047 Ohm
Lead Channel Impedance Value: 437 Ohm
Lead Channel Impedance Value: 456 Ohm
Lead Channel Pacing Threshold Amplitude: 0.625 V
Lead Channel Pacing Threshold Amplitude: 0.875 V
Lead Channel Pacing Threshold Amplitude: 2.125 V
Lead Channel Pacing Threshold Pulse Width: 0.4 ms
Lead Channel Pacing Threshold Pulse Width: 0.4 ms
Lead Channel Pacing Threshold Pulse Width: 1 ms
Lead Channel Sensing Intrinsic Amplitude: 3.5 mV
Lead Channel Sensing Intrinsic Amplitude: 3.5 mV
Lead Channel Sensing Intrinsic Amplitude: 31.625 mV
Lead Channel Sensing Intrinsic Amplitude: 31.625 mV
Lead Channel Setting Pacing Amplitude: 1.75 V
Lead Channel Setting Pacing Amplitude: 2.5 V
Lead Channel Setting Pacing Amplitude: 3 V
Lead Channel Setting Pacing Pulse Width: 0.4 ms
Lead Channel Setting Pacing Pulse Width: 1 ms
Lead Channel Setting Sensing Sensitivity: 0.6 mV

## 2021-04-18 ENCOUNTER — Other Ambulatory Visit: Payer: Self-pay

## 2021-04-19 LAB — URINE CULTURE

## 2021-04-22 ENCOUNTER — Telehealth: Payer: Self-pay | Admitting: Cardiology

## 2021-04-22 ENCOUNTER — Other Ambulatory Visit: Payer: Self-pay

## 2021-04-22 DIAGNOSIS — N5089 Other specified disorders of the male genital organs: Secondary | ICD-10-CM

## 2021-04-22 DIAGNOSIS — R8271 Bacteriuria: Secondary | ICD-10-CM

## 2021-04-22 NOTE — Progress Notes (Signed)
B ref  

## 2021-04-22 NOTE — Telephone Encounter (Signed)
Patient returned call for lab results. Transferred to RN.  ?

## 2021-04-22 NOTE — Telephone Encounter (Signed)
Already spoke to patient u/a and urine culture results given.Dr.Jordan advised to see urologist.Advised order placed, a scheduler will call back with appointment. ?

## 2021-04-23 ENCOUNTER — Ambulatory Visit (INDEPENDENT_AMBULATORY_CARE_PROVIDER_SITE_OTHER): Payer: PPO | Admitting: Family Medicine

## 2021-04-23 ENCOUNTER — Encounter: Payer: Self-pay | Admitting: Family Medicine

## 2021-04-23 VITALS — BP 126/74 | HR 79 | Temp 96.9°F | Ht 74.0 in | Wt 233.4 lb

## 2021-04-23 DIAGNOSIS — I2510 Atherosclerotic heart disease of native coronary artery without angina pectoris: Secondary | ICD-10-CM

## 2021-04-23 DIAGNOSIS — Z8739 Personal history of other diseases of the musculoskeletal system and connective tissue: Secondary | ICD-10-CM | POA: Diagnosis not present

## 2021-04-23 DIAGNOSIS — I2583 Coronary atherosclerosis due to lipid rich plaque: Secondary | ICD-10-CM | POA: Diagnosis not present

## 2021-04-23 DIAGNOSIS — K529 Noninfective gastroenteritis and colitis, unspecified: Secondary | ICD-10-CM | POA: Diagnosis not present

## 2021-04-23 DIAGNOSIS — F419 Anxiety disorder, unspecified: Secondary | ICD-10-CM

## 2021-04-23 DIAGNOSIS — M549 Dorsalgia, unspecified: Secondary | ICD-10-CM | POA: Diagnosis not present

## 2021-04-23 DIAGNOSIS — E7800 Pure hypercholesterolemia, unspecified: Secondary | ICD-10-CM

## 2021-04-23 DIAGNOSIS — R5383 Other fatigue: Secondary | ICD-10-CM | POA: Diagnosis not present

## 2021-04-23 NOTE — Progress Notes (Signed)
? ? ?Provider:  ?Jacalyn Lefevre, MD ? ?Careteam: ?Patient Care Team: ?Tysinger, Cleda Mccreedy as PCP - General (Family Medicine) ?Swaziland, Peter M, MD as PCP - Cardiology (Cardiology) ?[name], MD as PCP - Electrophysiology (Cardiology) ?Verner Chol, [facility] as Pharmacist (Pharmacist) ? ?PLACE OF SERVICE:  ?[facility]  ?Advanced Directive information ?  ? ?Allergies  ?Allergen Reactions  ? Ezetimibe Other (See Comments)  ?  Joint pain and drops BP  ? Niacin Other (See Comments)  ?  Drops BP and severe  ? Statins Other (See Comments)  ?  Severe joint pain and drops BP.  ? ? ?Chief Complaint  ?Patient presents with  ? New Patient (Initial Visit)  ?  Patient presents today for a new patient appointment.  ? ? ? ?HPI: Patient is a 66 y.o. male .  New visit for this 66 year old gentlema he is followed by cardiology closely he takes Repatha for his lipids as well as Jardiance.  He does report increased urination but also takes Lasix and has dependent edema that could be cardiac related but also history of venous stasis disease. ?He has been having issues recently with low energy.  Testosterone level was slightly low and he was given some preliminary doses of replacement therapy but urged to stop this by cardiology given his heart disease. ?Today he complains of some frequent bowel movements.  Not really diarrhea.  Past history of irritable bowel syndrome.  Seems to be worse with stress. ?Has had recent pyelonephritis and has a follow-up appointment with urology related to that.  n who has a history of atherosclerosis of aorta, coronary artery disease status post bypass surgery, lumbar back pain status post fusion, hypertension. ? ? ?Review of Systems:  ?Review of Systems  ?Constitutional:  Positive for malaise/fatigue.  ?HENT: Negative.    ?Respiratory: Negative.    ?Cardiovascular:  Positive for leg swelling.  ?Genitourinary:  Positive for frequency.  ?Musculoskeletal:  Positive for back pain.  ?Skin: Negative.    ?Psychiatric/Behavioral:  The patient is nervous/anxious.   ?All other systems reviewed and are negative. ? ?Past Medical History:  ?Diagnosis Date  ? AICD (automatic cardioverter/defibrillator) present   ? Anxiety   ? Arthritis   ? "mild; back, neck, spine" (06/09/2017)  ? CHF (congestive heart failure) ([facility])   ? Chronic back pain   ? Coronary artery disease   ? Dyspnea   ? GERD (gastroesophageal reflux disease)   ? History of gout   ? History of kidney stones X 2  ? Hypercholesterolemia   ? Hypertension   ? LBBB (left bundle branch block)   ? MI (myocardial infarction) ([facility]) 1993  ? LATERAL  ? MI (myocardial infarction) ([facility]) ?09/2001; 06/07/2017  ? Pneumonia   ? "several times" (06/09/2017)  ? S/P coronary artery stent placement   ? RCA  ? Varicose veins   ? ?Past Surgical History:  ?Procedure Laterality Date  ? ANTERIOR CERVICAL DECOMP/DISCECTOMY FUSION  03/03/2011  ? Procedure: ANTERIOR CERVICAL DECOMPRESSION/DISCECTOMY FUSION 3 LEVELS;  Surgeon: Karn Cassis, MD;  Location: [facility];  Service: Neurosurgery;  Laterality: N/A;  Cervical three-four Cervical four-five Cervical five-six Anterior cervical decompression/diskectomy, fusion  ? APPLICATION OF ROBOTIC ASSISTANCE FOR SPINAL PROCEDURE N/A 10/03/2020  ? Procedure: APPLICATION OF ROBOTIC ASSISTANCE FOR SPINAL PROCEDURE;  Surgeon: Lisbeth Renshaw, MD;  [facility];  Service: Neurosurgery;  Laterality: N/A;  ? BACK SURGERY    ? CARDIAC CATHETERIZATION  2009  ? Stents in RCA  patent. 70 to 80% PL, and 50% ostial PD.   ? CARDIAC CATHETERIZATION N/A 11/20/2014  ? Procedure: Left Heart Cath and Coronary Angiography;  Surgeon: Peter M Swaziland, MD;  Location: [facility] IN[facility];  Service: Cardiovascular;  Laterality: N/A;  ? CORONARY ANGIOPLASTY    ? DIRECT ANGIOPLASTY THE MARGINAL BRANCH  ? CORONARY ANGIOPLASTY WITH STENT PLACEMENT    ? RCA  ? CORONARY ARTERY BYPASS GRAFT N/A 06/14/2017  ? Procedure: CORONARY ARTERY BYPASS GRAFTING (CABG) x four, using left  internal mammary artery and right leg greater saphenous vein harvested endoscopically;  Surgeon: Kerin Perna, MD;  Location: [facility] OR;  Service: Open Heart Surgery;  Laterality: N/A;  ? CYSTOSCOPY N/A 06/14/2017  ? Procedure: CYSTOSCOPY;  Surgeon: Kerin Perna, MD;  Location: [facility] OR;  Service: Open Heart Surgery;  Laterality: N/A;  ? ICD IMPLANT N/A 03/11/2018  ? Procedure: ICD IMPLANT - Dual Chamber;  Surgeon: [name], MD;  Location: [facility] INVASIVE CV LAB;  Service: Cardiovascular;  Laterality: N/A;  ? INSERTION OF SUPRAPUBIC CATHETER N/A 06/14/2017  ? Procedure: INSERTION OF SUPRAPUBIC CATHETER - Lower abdomen;  Surgeon: Kerin Perna, MD;  Location: [facility] OR;  Service: Open Heart Surgery;  Laterality: N/A;  ? POSTERIOR LUMBAR FUSION  10/2011  ? RIGHT/LEFT HEART CATH AND CORONARY ANGIOGRAPHY N/A 06/08/2017  ? Procedure: RIGHT/LEFT HEART CATH AND CORONARY ANGIOGRAPHY;  Surgeon: Swaziland, Peter M, MD;  Location: [facility] IN[facility];  Service: Cardiovascular;  Laterality: N/A;  ? RIGHT/LEFT HEART CATH AND CORONARY/GRAFT ANGIOGRAPHY N/A 09/06/2020  ? Procedure: RIGHT/LEFT HEART CATH AND CORONARY/GRAFT ANGIOGRAPHY;  Surgeon: Swaziland, Peter M, MD;  Location: [facility] INVASIVE [facility] LAB;  Service: Cardiovascular;  Laterality: N/A;  ? TEE WITHOUT CARDIOVERSION N/A 06/14/2017  ? Procedure: TRANSESOPHAGEAL ECHOCARDIOGRAM (TEE);  Surgeon: Donata Clay, Theron Arista, MD;  Location: [facility] OR;  Service: Open Heart Surgery;  Laterality: N/A;  ? URETHROPLASTY N/A 10/15/2017  ? Procedure: URETHROPLASTY WITH BUCCAL GRAFT HARVAST;  Surgeon: Crist Fat, MD;  [facility];  Service: Urology;  Laterality: N/A;  ? ?Social History: ?  reports that he quit smoking about 21 years ago. His smoking use included cigarettes. He has a 40.00 pack-year smoking history. He has never used smokeless tobacco. He reports current alcohol use of about 2.0 - 3.0 standard drinks per week. He reports that he does not use drugs. ? ?Family History  ?Problem Relation Age  of Onset  ? Breast cancer Mother   ? Coronary artery disease Mother   ? Stroke Father   ? Heart attack Father   ? Diabetes Father   ? Heart attack Brother   ? Heart disease Brother   ? Heart attack Maternal Grandfather   ? ? ?Medications: ?Patient's Medications  ?New Prescriptions  ? No medications on file  ?Previous Medications  ? ACETAMINOPHEN (TYLENOL) 500 MG TABLET    Take 1,000 mg by mouth every 4 (four) hours as needed. Morning & noon  ? ASPIRIN 81 MG EC TABLET    Take 81 mg by mouth in the morning.  ? CARVEDILOL (COREG) 6.25 MG TABLET    Take 0.5 tablets (3.125 mg total) by mouth 2 (two) times daily with a meal. Must have office visit for additional refills [phone]  ? EMPAGLIFLOZIN (JARDIANCE) 10 MG TABS TABLET    Take 1 tablet (10 mg total) by mouth daily before breakfast.  ? ENTRESTO 24-26 MG    TAKE 1/2 TABLET BY MOUTH 2 TIMES DAILY  ?  ESOMEPRAZOLE (NEXIUM) 20 MG CAPSULE    TAKE 1 CAPSULE BY MOUTH EVERY DAY  ? FUROSEMIDE (LASIX) 40 MG TABLET    Take 1 tablet (40 mg total) by mouth daily.  ? GLUCOSAMINE-CHONDROITIN (OSTEO BI-FLEX REGULAR STRENGTH PO)    Take 1 tablet by mouth 2 (two) times daily. Morning & noon  ? ISOSORBIDE DINITRATE (ISORDIL) 30 MG TABLET    Take 30 mg by mouth daily.  ? LORATADINE (CLARITIN) 10 MG TABLET    Take 10 mg by mouth daily.  ? MULTIPLE VITAMIN (MULTIVITAMIN WITH MINERALS) TABS TABLET    Take 1 tablet by mouth in the morning and at bedtime. [employer]  ? REPATHA SURECLICK 140 MG/ML SOAJ    INJECT INTO SKIN EVERY 14 DAYS  ? ZOLPIDEM (AMBIEN) 10 MG TABLET    Take 1 tablet (10 mg total) by mouth at bedtime.  ?Modified Medications  ? No medications on file  ?Discontinued Medications  ? TESTOSTERONE CYPIONATE 200 MG/ML SOLN    Inject 200 mg as directed every 30 (thirty) days.  ? ? ?Physical Exam: ? ?Vitals:  ? 04/23/21 0927  ?BP: 126/74  ?Pulse: 79  ?Temp: (!) 96.9 ?F (36.1 ?C)  ?SpO2: 97%  ?Weight: 233 lb 6.4 oz (105.9 kg)  ?Height: 6\' 2"  (1.88 m)  ? ?Body mass index is 29.97  kg/m?. ?Wt Readings from Last 3 Encounters:  ?04/23/21 233 lb 6.4 oz (105.9 kg)  ?04/16/21 243 lb 6.4 oz (110.4 kg)  ?03/10/21 223 lb 9.6 oz (101.4 kg)  ? ? ?Physical Exam ?Vitals and nursing note r

## 2021-04-23 NOTE — Patient Instructions (Signed)
Recommend Citrucel to regulate bowel habits ?

## 2021-04-24 LAB — SEDIMENTATION RATE: Sed Rate: 6 mm/h (ref 0–20)

## 2021-04-24 LAB — C-REACTIVE PROTEIN: CRP: 10.7 mg/L — ABNORMAL HIGH (ref <8.0)

## 2021-04-28 ENCOUNTER — Telehealth: Payer: Self-pay | Admitting: Cardiology

## 2021-04-28 ENCOUNTER — Telehealth: Payer: Self-pay | Admitting: *Deleted

## 2021-04-28 NOTE — Telephone Encounter (Signed)
Patient called and stated that he was returning a call for results. Stated that he DOES NOT have MyChart.  ? ?[name] Honour, MD  ?04/27/2021  8:28 AM EDT   ?  ?ESR is normal but CRP is slightly elevated. These results are contradictory  ? ?Patient is wanting to know How to get his CRP level down to normal.  ? ?Please Advise.  ?

## 2021-04-28 NOTE — Telephone Encounter (Signed)
New Message: ? ? ? ? ?Patient said he was checking on his referral to [facility]. He said he call them this morning and they said they did not have a referral. ?

## 2021-04-29 NOTE — Telephone Encounter (Addendum)
Spoke to patient 4/24 he stated [facility] never received referral.Advised referral was faxed to them 6 days ago.Advised I will call [facility]. ? ?[facility] they received referral.Stated patient was called and he declined first available appointment.Stated RN in triage working on getting him a sooner appointment. ? ?Spoke to patient advised [facility] triage RN working on getting him a sooner appointment. ?

## 2021-04-29 NOTE — Telephone Encounter (Signed)
[name] Kuster, MD  You 3 hours ago (8:12 AM)  ? ?With sed rate normal and CRP up slightly, there is not a treatment unless we could identify what is making the test elevated   ? ?Patient notified and agreed.  ?

## 2021-05-02 NOTE — Progress Notes (Signed)
Remote ICD transmission.   

## 2021-05-08 ENCOUNTER — Telehealth: Payer: Self-pay

## 2021-05-08 ENCOUNTER — Telehealth (INDEPENDENT_AMBULATORY_CARE_PROVIDER_SITE_OTHER): Payer: PPO | Admitting: Nurse Practitioner

## 2021-05-08 ENCOUNTER — Encounter: Payer: Self-pay | Admitting: Nurse Practitioner

## 2021-05-08 VITALS — Wt 224.0 lb

## 2021-05-08 DIAGNOSIS — R197 Diarrhea, unspecified: Secondary | ICD-10-CM | POA: Diagnosis not present

## 2021-05-08 MED ORDER — METRONIDAZOLE 500 MG PO TABS: 500.0000 mg | ORAL_TABLET | Freq: Three times a day (TID) | ORAL | 0 refills | Status: AC

## 2021-05-08 NOTE — Progress Notes (Signed)
? ?  This service is provided via telemedicine ? ?No vital signs collected/recorded due to the encounter was a telemedicine visit.  ? ?Location of patient (ex: home, work):  Home ? ?Patient consents to a telephone visit: Yes, see telephone visit dated 05/08/21 ? ?Location of the provider (ex: office, home): [place] [employer], [place]  ? ?Name of any referring provider:  Frederica Kuster, MD ? ?Names of all persons participating in the telemedicine service and their role in the encounter:  S.Chrae B/CMA, Abbey Chatters, NP, and Patient  ? ?Time spent on call:  7 min with medical assistant  ? ?

## 2021-05-08 NOTE — Progress Notes (Signed)
? ? ?Careteam: ?Patient Care Team: ?Frederica Kuster, MD as PCP - General (Family Medicine) ?Swaz[place], Peter M, MD as PCP - Cardiology (Cardiology) ?[name], MD as PCP - Electrophysiology (Cardiology) ?Verner Chol, [facility] as Pharmacist (Pharmacist) ? ?Advanced Directive information ?  ? ?Allergies  ?Allergen Reactions  ? Ezetimibe Other (See Comments)  ?  Joint pain and drops BP  ? Niacin Other (See Comments)  ?  Drops BP and severe  ? Statins Other (See Comments)  ?  Severe joint pain and drops BP.  ? ? ?Chief Complaint  ?Patient presents with  ? Acute Visit  ?  Patient c/o diarrhea x 9 days and a little abdominal pain. Patient has dropped 10 pounds since last visit. Metamucil is not helping. Visit completed via telephone/video.   ? ? ? ?HPI: Patient is a 66 y.o. male for ongoing diarrhea.  ?Reports he will have a few goods day but then he will have diarrhea.  ?He has had 11 bowel movements today. 3 BM during the night.  ?No fever, maybe mild chills.  ?Having mild abdominal pain- a few weeks ago had severe pain in his abdomen.  ?A few weeks ago he was retaining fluids and he was placed on a new medication- that helped the fluid retention. He was having diarrhea prior to jardiance.  ?He has spells of this in the past and has been recurrent. Has seen GI Doctor. Episodes will come and go.  ?He has to make himself eat. No abdominal pain or cramping since but can feel stomach moving.  ?No recent antibiotic use.  ? ?Review of Systems:  ?Review of Systems  ?Constitutional:  Positive for malaise/fatigue and weight loss. Negative for chills and fever.  ?HENT:  Negative for tinnitus.   ?Respiratory:  Negative for cough.   ?Cardiovascular:  Negative for chest pain, palpitations and leg swelling.  ?Gastrointestinal:  Positive for diarrhea. Negative for abdominal pain, constipation and heartburn.  ?Genitourinary:  Negative for dysuria, frequency and urgency.  ?Musculoskeletal:  Negative for back pain, falls, joint  pain and myalgias.  ?Skin: Negative.   ?Neurological:  Negative for dizziness and headaches.  ? ?Past Medical History:  ?Diagnosis Date  ? AICD (automatic cardioverter/defibrillator) present   ? Anxiety   ? Arthritis   ? "mild; back, neck, spine" (06/09/2017)  ? CHF (congestive heart failure) (HCC)   ? Chronic back pain   ? Coronary artery disease   ? Dyspnea   ? GERD (gastroesophageal reflux disease)   ? History of gout   ? History of kidney stones X 2  ? Hypercholesterolemia   ? Hypertension   ? LBBB (left bundle branch block)   ? MI (myocardial infarction) (HCC) 1993  ? LATERAL  ? MI (myocardial infarction) (HCC) ?09/2001; 06/07/2017  ? Pneumonia   ? "several times" (06/09/2017)  ? S/P coronary artery stent placement   ? RCA  ? Varicose veins   ? ?Past Surgical History:  ?Procedure Laterality Date  ? ANTERIOR CERVICAL DECOMP/DISCECTOMY FUSION  03/03/2011  ? Procedure: ANTERIOR CERVICAL DECOMPRESSION/DISCECTOMY FUSION 3 LEVELS;  Surgeon: Karn Cassis, MD;  Location: [facility]UR[facility] ORS;  Service: Neurosurgery;  Laterality: N/A;  Cervical three-four Cervical four-five Cervical five-six Anterior cervical decompression/diskectomy, fusion  ? APPLICATION OF ROBOTIC ASSISTANCE FOR SPINAL PROCEDURE N/A 10/03/2020  ? Procedure: APPLICATION OF ROBOTIC ASSISTANCE FOR SPINAL PROCEDURE;  Surgeon: Lisbeth Renshaw, MD;  Location: [facility] OR;  Service: Neurosurgery;  Laterality: N/A;  ? BACK SURGERY    ?  CARDIAC CATHETERIZATION  2009  ? Stents in RCA patent. 70 to 80% PL, and 50% ostial PD.   ? CARDIAC CATHETERIZATION N/A 11/20/2014  ? Procedure: Left Heart Cath and Coronary Angiography;  Surgeon: Peter M Swaziland, MD;  Location: [facility] INVASIVE CV LAB;  Service: Cardiovascular;  Laterality: N/A;  ? CORONARY ANGIOPLASTY    ? DIRECT ANGIOPLASTY THE MARGINAL BRANCH  ? CORONARY ANGIOPLASTY WITH STENT PLACEMENT    ? RCA  ? CORONARY ARTERY BYPASS GRAFT N/A 06/14/2017  ? Procedure: CORONARY ARTERY BYPASS GRAFTING (CABG) x four, using left internal  mammary artery and right leg greater saphenous vein harvested endoscopically;  Surgeon: Kerin Perna, MD;  Location: [facility] OR;  Service: Open Heart Surgery;  Laterality: N/A;  ? CYSTOSCOPY N/A 06/14/2017  ? Procedure: CYSTOSCOPY;  Surgeon: Kerin Perna, MD;  Location: [facility] OR;  Service: Open Heart Surgery;  Laterality: N/A;  ? ICD IMPLANT N/A 03/11/2018  ? Procedure: ICD IMPLANT - Dual Chamber;  Surgeon: [name], MD;  Location: [facility] INVASIVE CV LAB;  Service: Cardiovascular;  Laterality: N/A;  ? INSERTION OF SUPRAPUBIC CATHETER N/A 06/14/2017  ? Procedure: INSERTION OF SUPRAPUBIC CATHETER - Lower abdomen;  Surgeon: Kerin Perna, MD;  Location: [facility] OR;  Service: Open Heart Surgery;  Laterality: N/A;  ? POSTERIOR LUMBAR FUSION  10/2011  ? RIGHT/LEFT HEART CATH AND CORONARY ANGIOGRAPHY N/A 06/08/2017  ? Procedure: RIGHT/LEFT HEART CATH AND CORONARY ANGIOGRAPHY;  Surgeon: Swaziland, Peter M, MD;  Location: [facility] INVASIVE CV LAB;  Service: Cardiovascular;  Laterality: N/A;  ? RIGHT/LEFT HEART CATH AND CORONARY/GRAFT ANGIOGRAPHY N/A 09/06/2020  ? Procedure: RIGHT/LEFT HEART CATH AND CORONARY/GRAFT ANGIOGRAPHY;  Surgeon: Swazil[place], Peter M, MD;  Location: [facility] INVASIVE CV LAB;  Service: Cardiovascular;  Laterality: N/A;  ? TEE WITHOUT CARDIOVERSION N/A 06/14/2017  ? Procedure: TRANSESOPHAGEAL ECHOCARDIOGRAM (TEE);  Surgeon: Donata Clay, Theron Arista, MD;  Location: [facility] OR;  Service: Open Heart Surgery;  Laterality: N/A;  ? URETHROPLASTY N/A 10/15/2017  ? Procedure: URETHROPLASTY WITH BUCCAL GRAFT HARVAST;  Surgeon: Crist Fat, MD;  Location: [facility] ORS;  Service: Urology;  Laterality: N/A;  ? ?Social History: ?  reports that he quit smoking about 22 years ago. His smoking use included cigarettes. He has a 40.00 pack-year smoking history. He has never used smokeless tobacco. He reports that he does not currently use alcohol. He reports that he does not use drugs. ? ?Family History  ?Problem Relation Age of Onset  ? Breast cancer Mother    ? Coronary artery disease Mother   ? Stroke Father   ? Heart attack Father   ? Diabetes Father   ? Heart attack Brother   ? Heart disease Brother   ? Heart attack Maternal Grandfather   ? ? ?Medications: ?Patient's Medications  ?New Prescriptions  ? No medications on file  ?Previous Medications  ? ACETAMINOPHEN (TYLENOL) 500 MG TABLET    Take 1,000 mg by mouth every 4 (four) hours as needed. Morning & noon  ? ASPIRIN 81 MG EC TABLET    Take 81 mg by mouth in the morning.  ? CARVEDILOL (COREG) 6.25 MG TABLET    Take 0.5 tablets (3.125 mg total) by mouth 2 (two) times daily with a meal. Must have office visit for additional refills [phone]  ? EMPAGLIFLOZIN (JARDIANCE) 10 MG TABS TABLET    Take 1 tablet (10 mg total) by mouth daily before breakfast.  ? ENTRESTO 24-26 MG    TAKE 1/2 TABLET BY MOUTH 2  TIMES DAILY  ? ESOMEPRAZOLE (NEXIUM) 20 MG CAPSULE    TAKE 1 CAPSULE BY MOUTH EVERY DAY  ? FUROSEMIDE (LASIX) 40 MG TABLET    Take 1 tablet (40 mg total) by mouth daily.  ? GLUCOSAMINE-CHONDROITIN (OSTEO BI-FLEX REGULAR STRENGTH PO)    Take 1 tablet by mouth 2 (two) times daily. Morning & noon  ? ISOSORBIDE DINITRATE (ISORDIL) 30 MG TABLET    Take 30 mg by mouth daily.  ? LORATADINE (CLARITIN) 10 MG TABLET    Take 10 mg by mouth daily.  ? MULTIPLE VITAMIN (MULTIVITAMIN WITH MINERALS) TABS TABLET    Take 1 tablet by mouth in the morning and at bedtime. [employer]  ? PSYLLIUM (HYDROCIL/METAMUCIL) 95 % PACK    Take 1 packet by mouth daily.  ? REPATHA SURECLICK 140 MG/ML SOAJ    INJECT INTO SKIN EVERY 14 DAYS  ? ZOLPIDEM (AMBIEN) 10 MG TABLET    Take 1 tablet (10 mg total) by mouth at bedtime.  ?Modified Medications  ? No medications on file  ?Discontinued Medications  ? No medications on file  ? ? ?Physical Exam: ? ?Vitals:  ? 05/08/21 1351  ?Weight: 224 lb (101.6 kg)  ? ?Body mass index is 28.76 kg/m?. ?Wt Readings from Last 3 Encounters:  ?05/08/21 224 lb (101.6 kg)  ?04/23/21 233 lb 6.4 oz (105.9 kg)  ?04/16/21 243  lb 6.4 oz (110.4 kg)  ? ? ?Physical Exam ?Constitutional:   ?   Appearance: Normal appearance.  ?Neurological:  ?   Mental Status: He is alert. Mental status is at baseline.  ?Psychiatric:     ?   Mood and

## 2021-05-08 NOTE — Telephone Encounter (Signed)
[name], lia are scheduled for a virtual visit with your provider today.   ? ?Just as we do with appointments in the office, we must obtain your consent to participate.  Your consent will be active for this visit and any virtual visit you may have with one of our providers in the next 365 days.   ? ?If you have a MyChart account, I can also send a copy of this consent to you electronically.  All virtual visits are billed to your insurance company just like a traditional visit in the office.  As this is a virtual visit, video technology does not allow for your provider to perform a traditional examination.  This may limit your provider's ability to fully assess your condition.  If your provider identifies any concerns that need to be evaluated in person or the need to arrange testing such as labs, EKG, etc, we will make arrangements to do so.   ? ?Although advances in technology are sophisticated, we cannot ensure that it will always work on either your end or our end.  If the connection with a video visit is poor, we may have to switch to a telephone visit.  With either a video or telephone visit, we are not always able to ensure that we have a secure connection.   I need to obtain your verbal consent now.   Are you willing to proceed with your visit today?  ? ?[name] has provided verbal consent on 05/08/2021 for a virtual visit (video or telephone). ? ? ?Edison Simon Chrae, CMA ?05/08/2021  1:44 PM ? ? ?

## 2021-05-09 ENCOUNTER — Other Ambulatory Visit: Payer: Self-pay

## 2021-05-09 DIAGNOSIS — R197 Diarrhea, unspecified: Secondary | ICD-10-CM

## 2021-05-12 ENCOUNTER — Other Ambulatory Visit: Payer: Self-pay | Admitting: Cardiology

## 2021-05-12 DIAGNOSIS — R1084 Generalized abdominal pain: Secondary | ICD-10-CM | POA: Diagnosis not present

## 2021-05-12 DIAGNOSIS — N200 Calculus of kidney: Secondary | ICD-10-CM | POA: Diagnosis not present

## 2021-05-12 LAB — CLOSTRIDIUM DIFFICILE TOXIN B, QUALITATIVE, REAL-TIME PCR: Toxigenic C. Difficile by PCR: NOT DETECTED

## 2021-05-13 ENCOUNTER — Other Ambulatory Visit: Payer: Self-pay

## 2021-05-13 DIAGNOSIS — K529 Noninfective gastroenteritis and colitis, unspecified: Secondary | ICD-10-CM

## 2021-05-13 MED ORDER — PROBIOTIC DAILY PO CAPS: 1.0000 | ORAL_CAPSULE | Freq: Two times a day (BID) | ORAL | 3 refills | Status: DC

## 2021-05-13 NOTE — Addendum Note (Signed)
Addended by: Logan Bores on: 05/13/2021 10:39 AM ? ? Modules accepted: Orders ? ?

## 2021-05-14 ENCOUNTER — Other Ambulatory Visit: Payer: Self-pay | Admitting: Cardiology

## 2021-05-14 DIAGNOSIS — N200 Calculus of kidney: Secondary | ICD-10-CM | POA: Diagnosis not present

## 2021-05-14 DIAGNOSIS — M4317 Spondylolisthesis, lumbosacral region: Secondary | ICD-10-CM | POA: Diagnosis not present

## 2021-05-14 DIAGNOSIS — M533 Sacrococcygeal disorders, not elsewhere classified: Secondary | ICD-10-CM | POA: Diagnosis not present

## 2021-05-14 DIAGNOSIS — K5730 Diverticulosis of large intestine without perforation or abscess without bleeding: Secondary | ICD-10-CM | POA: Diagnosis not present

## 2021-05-14 DIAGNOSIS — I700 Atherosclerosis of aorta: Secondary | ICD-10-CM | POA: Diagnosis not present

## 2021-05-15 ENCOUNTER — Emergency Department (HOSPITAL_BASED_OUTPATIENT_CLINIC_OR_DEPARTMENT_OTHER): Payer: PPO

## 2021-05-15 ENCOUNTER — Telehealth: Payer: Self-pay

## 2021-05-15 ENCOUNTER — Encounter (HOSPITAL_BASED_OUTPATIENT_CLINIC_OR_DEPARTMENT_OTHER): Payer: Self-pay | Admitting: Obstetrics and Gynecology

## 2021-05-15 ENCOUNTER — Emergency Department (HOSPITAL_BASED_OUTPATIENT_CLINIC_OR_DEPARTMENT_OTHER)
Admission: EM | Admit: 2021-05-15 | Discharge: 2021-05-15 | Disposition: A | Discharge status: Home / Self Care | Payer: PPO | Attending: Emergency Medicine | Admitting: Emergency Medicine

## 2021-05-15 ENCOUNTER — Emergency Department (HOSPITAL_BASED_OUTPATIENT_CLINIC_OR_DEPARTMENT_OTHER): Payer: PPO | Admitting: Radiology

## 2021-05-15 ENCOUNTER — Other Ambulatory Visit: Payer: Self-pay

## 2021-05-15 DIAGNOSIS — R600 Localized edema: Secondary | ICD-10-CM | POA: Diagnosis not present

## 2021-05-15 DIAGNOSIS — J9811 Atelectasis: Secondary | ICD-10-CM | POA: Diagnosis not present

## 2021-05-15 DIAGNOSIS — R109 Unspecified abdominal pain: Secondary | ICD-10-CM | POA: Diagnosis not present

## 2021-05-15 DIAGNOSIS — R0602 Shortness of breath: Secondary | ICD-10-CM | POA: Diagnosis not present

## 2021-05-15 DIAGNOSIS — I509 Heart failure, unspecified: Secondary | ICD-10-CM | POA: Diagnosis not present

## 2021-05-15 DIAGNOSIS — R1011 Right upper quadrant pain: Secondary | ICD-10-CM | POA: Diagnosis not present

## 2021-05-15 DIAGNOSIS — R110 Nausea: Secondary | ICD-10-CM | POA: Insufficient documentation

## 2021-05-15 DIAGNOSIS — Z79899 Other long term (current) drug therapy: Secondary | ICD-10-CM | POA: Diagnosis not present

## 2021-05-15 DIAGNOSIS — K5730 Diverticulosis of large intestine without perforation or abscess without bleeding: Secondary | ICD-10-CM | POA: Diagnosis not present

## 2021-05-15 DIAGNOSIS — J90 Pleural effusion, not elsewhere classified: Secondary | ICD-10-CM | POA: Diagnosis not present

## 2021-05-15 DIAGNOSIS — Z7982 Long term (current) use of aspirin: Secondary | ICD-10-CM | POA: Diagnosis not present

## 2021-05-15 DIAGNOSIS — R1084 Generalized abdominal pain: Secondary | ICD-10-CM | POA: Diagnosis not present

## 2021-05-15 DIAGNOSIS — K828 Other specified diseases of gallbladder: Secondary | ICD-10-CM | POA: Diagnosis not present

## 2021-05-15 DIAGNOSIS — R079 Chest pain, unspecified: Secondary | ICD-10-CM | POA: Diagnosis not present

## 2021-05-15 DIAGNOSIS — J811 Chronic pulmonary edema: Secondary | ICD-10-CM | POA: Diagnosis not present

## 2021-05-15 DIAGNOSIS — I2510 Atherosclerotic heart disease of native coronary artery without angina pectoris: Secondary | ICD-10-CM | POA: Insufficient documentation

## 2021-05-15 DIAGNOSIS — R197 Diarrhea, unspecified: Secondary | ICD-10-CM | POA: Diagnosis not present

## 2021-05-15 DIAGNOSIS — I110 Hypertensive heart disease with heart failure: Secondary | ICD-10-CM | POA: Insufficient documentation

## 2021-05-15 DIAGNOSIS — K4090 Unilateral inguinal hernia, without obstruction or gangrene, not specified as recurrent: Secondary | ICD-10-CM | POA: Diagnosis not present

## 2021-05-15 LAB — COMPREHENSIVE METABOLIC PANEL
ALT: 42 U/L (ref 0–44)
AST: 39 U/L (ref 15–41)
Albumin: 4.4 g/dL (ref 3.5–5.0)
Alkaline Phosphatase: 51 U/L (ref 38–126)
Anion gap: 10 (ref 5–15)
BUN: 13 mg/dL (ref 8–23)
CO2: 28 mmol/L (ref 22–32)
Calcium: 8.9 mg/dL (ref 8.9–10.3)
Chloride: 99 mmol/L (ref 98–111)
Creatinine, Ser: 0.95 mg/dL (ref 0.61–1.24)
GFR, Estimated: >60 mL/min (ref >60)
Glucose, Bld: 119 mg/dL — ABNORMAL HIGH (ref 70–99)
Potassium: 3.7 mmol/L (ref 3.5–5.1)
Sodium: 137 mmol/L (ref 135–145)
Total Bilirubin: 1.2 mg/dL (ref 0.3–1.2)
Total Protein: 6.5 g/dL (ref 6.5–8.1)

## 2021-05-15 LAB — URINALYSIS, ROUTINE W REFLEX MICROSCOPIC
Bilirubin Urine: NEGATIVE
Glucose, UA: 500 mg/dL — AB
Hgb urine dipstick: NEGATIVE
Ketones, ur: NEGATIVE mg/dL
Nitrite: NEGATIVE
Protein, ur: NEGATIVE mg/dL
Specific Gravity, Urine: 1.008 (ref 1.005–1.030)
pH: 5.5 (ref 5.0–8.0)

## 2021-05-15 LAB — CBC WITH DIFFERENTIAL/PLATELET
Abs Immature Granulocytes: 0.05 10*3/uL (ref 0.00–0.07)
Basophils Absolute: 0 10*3/uL (ref 0.0–0.1)
Basophils Relative: 0 %
Eosinophils Absolute: 0.1 10*3/uL (ref 0.0–0.5)
Eosinophils Relative: 1 %
HCT: 42.7 % (ref 39.0–52.0)
Hemoglobin: 13.6 g/dL (ref 13.0–17.0)
Immature Granulocytes: 1 %
Lymphocytes Relative: 10 %
Lymphs Abs: 0.9 10*3/uL (ref 0.7–4.0)
MCH: 30.9 pg (ref 26.0–34.0)
MCHC: 31.9 g/dL (ref 30.0–36.0)
MCV: 97 fL (ref 80.0–100.0)
Monocytes Absolute: 0.7 10*3/uL (ref 0.1–1.0)
Monocytes Relative: 7 %
Neutro Abs: 7.7 10*3/uL (ref 1.7–7.7)
Neutrophils Relative %: 81 %
Platelets: 217 10*3/uL (ref 150–400)
RBC: 4.4 MIL/uL (ref 4.22–5.81)
RDW: 13.3 % (ref 11.5–15.5)
WBC: 9.4 10*3/uL (ref 4.0–10.5)
nRBC: 0 % (ref 0.0–0.2)

## 2021-05-15 LAB — TROPONIN I (HIGH SENSITIVITY)
Troponin I (High Sensitivity): 21 ng/L — ABNORMAL HIGH (ref <18)
Troponin I (High Sensitivity): 22 ng/L — ABNORMAL HIGH (ref <18)

## 2021-05-15 LAB — D-DIMER, QUANTITATIVE: D-Dimer, Quant: 1.11 ug/mL-FEU — ABNORMAL HIGH (ref 0.00–0.50)

## 2021-05-15 LAB — BRAIN NATRIURETIC PEPTIDE: B Natriuretic Peptide: 325.4 pg/mL — ABNORMAL HIGH (ref 0.0–100.0)

## 2021-05-15 LAB — LIPASE, BLOOD: Lipase: 12 U/L (ref 11–51)

## 2021-05-15 MED ORDER — IOHEXOL 350 MG/ML SOLN
100.0000 mL | Freq: Once | INTRAVENOUS | Status: AC | PRN
  Administered 2021-05-15: 80 mL via INTRAVENOUS

## 2021-05-15 MED ORDER — DICYCLOMINE HCL 10 MG PO CAPS
10.0000 mg | ORAL_CAPSULE | Freq: Once | ORAL | Status: AC
  Administered 2021-05-15: 10 mg via ORAL
  Filled 2021-05-15: qty 1

## 2021-05-15 MED ORDER — DICYCLOMINE HCL 20 MG PO TABS: 20.0000 mg | ORAL_TABLET | Freq: Two times a day (BID) | ORAL | 0 refills | Status: DC

## 2021-05-15 MED ORDER — ONDANSETRON HCL 4 MG/2ML IJ SOLN
4.0000 mg | Freq: Once | INTRAMUSCULAR | Status: AC
  Administered 2021-05-15: 4 mg via INTRAVENOUS
  Filled 2021-05-15: qty 2

## 2021-05-15 MED ORDER — ONDANSETRON HCL 4 MG PO TABS: 4.0000 mg | ORAL_TABLET | Freq: Four times a day (QID) | ORAL | 0 refills | Status: DC

## 2021-05-15 NOTE — ED Provider Notes (Signed)
?[place] EMERGENCY DEPT ?Provider Note ? ? ?CSN: [id number] ?Arrival date & time: 05/15/21  1457 ? ?  ? ?History ? ?Chief Complaint  ?Patient presents with  ? Shortness of Breath  ? Abdominal Pain  ? ? ?[name] is a 66 y.o. male. ? ?The history is provided by the patient.  ?Abdominal Pain ?Pain location:  Generalized ?Pain quality: aching and bloating   ?Pain radiates to:  Does not radiate ?Pain severity:  Mild ?Onset quality:  Gradual ?Timing:  Intermittent ?Progression:  Waxing and waning ?Chronicity:  Recurrent ?Context: not previous surgeries   ?Relieved by:  Nothing ?Worsened by:  Nothing ?Associated symptoms: diarrhea, nausea and shortness of breath   ?Associated symptoms: no anorexia, no belching, no chest pain, no chills, no constipation, no cough, no dysuria, no fatigue, no fever, no flatus, no hematemesis, no hematuria, no melena, no sore throat and no vomiting   ?Risk factors comment:  CHF, chroinic diarrhea, CAD, KIDNEY STONES ? ?  ? ?Home Medications ?Prior to Admission medications   ?Medication Sig Start Date End Date Taking? Authorizing Provider  ?dicyclomine (BENTYL) 20 MG tablet Take 1 tablet (20 mg total) by mouth 2 (two) times daily. 05/15/21  Yes Cadell Gabrielson, DO  ?ondansetron (ZOFRAN) 4 MG tablet Take 1 tablet (4 mg total) by mouth every 6 (six) hours. 05/15/21  Yes Yunique Dearcos, DO  ?acetaminophen (TYLENOL) 500 MG tablet Take 1,000 mg by mouth every 4 (four) hours as needed. Morning & noon    [provider]  ?aspirin 81 MG EC tablet Take 81 mg by mouth in the morning.    [provider]  ?carvedilol (COREG) 6.25 MG tablet Take 0.5 tablets (3.125 mg total) by mouth 2 (two) times daily with a meal. Must have office visit for additional refills [phone] 02/05/20   [place], Peter M, MD  ?empagliflozin (JARDIANCE) 10 MG TABS tablet Take 1 tablet (10 mg total) by mouth daily before breakfast. 04/16/21   [place], Peter M, MD  ?Delene Loll 24-26 MG TAKE 1/2  TABLET BY MOUTH 2 TIMES DAILY 05/14/21   [place], Peter M, MD  ?esomeprazole (NEXIUM) 20 MG capsule TAKE 1 CAPSULE BY MOUTH EVERY DAY 12/11/20   [place], Peter M, MD  ?furosemide (LASIX) 40 MG tablet Take 1 tablet (40 mg total) by mouth daily. 04/16/21 04/11/22  [place], Peter M, MD  ?Glucosamine-Chondroitin (OSTEO BI-FLEX REGULAR STRENGTH PO) Take 1 tablet by mouth 2 (two) times daily. Morning & noon    [provider]  ?isosorbide dinitrate (ISORDIL) 30 MG tablet Take 30 mg by mouth daily.    [provider]  ?loratadine (CLARITIN) 10 MG tablet Take 10 mg by mouth daily.    [provider]  ?metroNIDAZOLE (FLAGYL) 500 MG tablet Take 1 tablet (500 mg total) by mouth 3 (three) times daily for 7 days. 05/08/21 05/15/21  Lauree Chandler, NP  ?Multiple Vitamin (MULTIVITAMIN WITH MINERALS) TABS tablet Take 1 tablet by mouth in the morning and at bedtime. [employer]    [provider]  ?Probiotic Product (PROBIOTIC DAILY) CAPS Take 1 capsule by mouth in the morning and at bedtime. 05/13/21   Lauree Chandler, NP  ?psyllium (HYDROCIL/METAMUCIL) 95 % PACK Take 1 packet by mouth daily.    [provider]  ?REPATHA SURECLICK [id number] MG/ML SOAJ INJECT INTO SKIN EVERY 14 DAYS 05/12/21   [place], Peter M, MD  ?zolpidem (AMBIEN) 10 MG tablet Take 1 tablet (10 mg total) by mouth at  bedtime. 03/10/21   Tysinger, Camelia Eng, PA-C  ?   ? ?Allergies    ?Ezetimibe, Niacin, and Statins   ? ?Review of Systems   ?Review of Systems  ?Constitutional:  Negative for chills, fatigue and fever.  ?HENT:  Negative for sore throat.   ?Respiratory:  Positive for shortness of breath. Negative for cough.   ?Cardiovascular:  Negative for chest pain.  ?Gastrointestinal:  Positive for abdominal pain, diarrhea and nausea. Negative for anorexia, constipation, flatus, hematemesis, melena and vomiting.  ?Genitourinary:  Negative for dysuria and hematuria.  ? ?Physical Exam ?Updated Vital Signs ?BP (!) 142/87   Pulse 80   Temp  98 ?F (36.7 ?C)   Resp 13   Ht 6\' 2"  (1.88 m)   Wt 101.6 kg   SpO2 92%   BMI 28.76 kg/m?  ?Physical Exam ?Vitals and nursing note reviewed.  ?Constitutional:   ?   General: He is not in acute distress. ?   Appearance: He is well-developed. He is not ill-appearing.  ?HENT:  ?   Head: Normocephalic and atraumatic.  ?Eyes:  ?   Conjunctiva/sclera: Conjunctivae normal.  ?Cardiovascular:  ?   Rate and Rhythm: Normal rate and regular rhythm.  ?   Pulses: Normal pulses.  ?   Heart sounds: Normal heart sounds. No murmur heard. ?Pulmonary:  ?   Effort: Pulmonary effort is normal. No respiratory distress.  ?   Breath sounds: Normal breath sounds.  ?Abdominal:  ?   General: There is distension.  ?   Palpations: Abdomen is soft.  ?   Tenderness: There is generalized abdominal tenderness.  ?Musculoskeletal:     ?   General: No swelling. Normal range of motion.  ?   Cervical back: Neck supple.  ?   Right lower leg: Edema (1+) present.  ?   Left lower leg: Edema (1+\) present.  ?Skin: ?   General: Skin is warm and dry.  ?   Capillary Refill: Capillary refill takes less than 2 seconds.  ?Neurological:  ?   General: No focal deficit present.  ?   Mental Status: He is alert.  ?Psychiatric:     ?   Mood and Affect: Mood normal.  ? ? ?ED Results / Procedures / Treatments   ?Labs ?(all labs ordered are listed, but only abnormal results are displayed) ?Labs Reviewed  ?COMPREHENSIVE METABOLIC PANEL - Abnormal; Notable for the following components:  ?    Result Value  ? Glucose, Bld 119 (*)   ? All other components within normal limits  ?URINALYSIS, ROUTINE W REFLEX MICROSCOPIC - Abnormal; Notable for the following components:  ? Glucose, UA 500 (*)   ? Leukocytes,Ua LARGE (*)   ? Bacteria, UA RARE (*)   ? All other components within normal limits  ?BRAIN NATRIURETIC PEPTIDE - Abnormal; Notable for the following components:  ? B Natriuretic Peptide 325.4 (*)   ? All other components within normal limits  ?D-DIMER, QUANTITATIVE -  Abnormal; Notable for the following components:  ? D-Dimer, Quant 1.11 (*)   ? All other components within normal limits  ?TROPONIN I (HIGH SENSITIVITY) - Abnormal; Notable for the following components:  ? Troponin I (High Sensitivity) 21 (*)   ? All other components within normal limits  ?TROPONIN I (HIGH SENSITIVITY) - Abnormal; Notable for the following components:  ? Troponin I (High Sensitivity) 22 (*)   ? All other components within normal limits  ?CBC WITH DIFFERENTIAL/PLATELET  ?LIPASE, BLOOD  ? ? ?EKG ?  EKG Interpretation ? ?Date/Time:  Thursday May 15 2021 15:39:20 EDT ?Ventricular Rate:  77 ?PR Interval:  88 ?QRS Duration: 172 ?QT Interval:  478 ?QTC Calculation: 540 ?R Axis:   66 ?Text Interpretation: Atrial-sensed ventricular-paced rhythm with occasional Premature ventricular complexes Abnormal ECG When compared with ECG of 06-Sep-2020 06:13, Premature ventricular complexes are now Present Vent. rate has decreased BY   2 BPM Confirmed by Ronnald Nian, Joniyah Mallinger ([id number]) on 05/15/2021 3:41:52 PM ? ?Radiology ?CT Angio Chest PE W and/or Wo Contrast ? ?Result Date: 05/15/2021 ?CLINICAL DATA:  Shortness of breath and abdominal pain. EXAM: CT ANGIOGRAPHY CHEST CT ABDOMEN AND PELVIS WITH CONTRAST TECHNIQUE: Multidetector CT imaging of the chest was performed using the standard protocol during bolus administration of intravenous contrast. Multiplanar CT image reconstructions and MIPs were obtained to evaluate the vascular anatomy. Multidetector CT imaging of the abdomen and pelvis was performed using the standard protocol during bolus administration of intravenous contrast. RADIATION DOSE REDUCTION: This exam was performed according to the departmental dose-optimization program which includes automated exposure control, adjustment of the mA and/or kV according to patient size and/or use of iterative reconstruction technique. CONTRAST:  29mL OMNIPAQUE IOHEXOL 350 MG/ML SOLN COMPARISON:  CT chest 07/31/2020.  CT scan  05/14/2021 FINDINGS: CTA CHEST FINDINGS Cardiovascular: The heart is mildly enlarged but appears stable. No pericardial effusion. Stable fusiform aneurysmal dilatation of the ascending thoracic aorta with maximum measurement of

## 2021-05-15 NOTE — ED Notes (Signed)
US at the Bedside. °

## 2021-05-15 NOTE — ED Notes (Signed)
Patient returned from Radiology. 

## 2021-05-15 NOTE — ED Notes (Signed)
Patient transported to Radiology at this Time. 

## 2021-05-15 NOTE — Telephone Encounter (Signed)
Patient called on clinical intake and stated that his condition was worsening and needed advice ?Called him back and offered to put him on today's schedule but he said, he is on the way to the hospital instead and will contact us afterwards. MLP ?

## 2021-05-15 NOTE — ED Notes (Signed)
Patient transported to XRAY 

## 2021-05-15 NOTE — ED Notes (Signed)
RN provided AVS using Teachback Method. Patient verbalizes understanding of Discharge Instructions. Opportunity for Questioning and Answers were provided by RN. Patient Discharged from ED ambulatory to Home. ° °

## 2021-05-15 NOTE — ED Triage Notes (Signed)
Patient reports to the ER for shortness of breath and abdominal pain. Patient reports he has been having problems with his breathing and his pain in his abdomen has gotten worse.  ?

## 2021-05-21 ENCOUNTER — Encounter: Payer: Self-pay | Admitting: Family Medicine

## 2021-05-21 ENCOUNTER — Ambulatory Visit (INDEPENDENT_AMBULATORY_CARE_PROVIDER_SITE_OTHER): Payer: PPO | Admitting: Family Medicine

## 2021-05-21 VITALS — BP 132/84 | HR 87 | Temp 97.7°F | Ht 74.0 in | Wt 240.2 lb

## 2021-05-21 DIAGNOSIS — M25569 Pain in unspecified knee: Secondary | ICD-10-CM

## 2021-05-21 DIAGNOSIS — R350 Frequency of micturition: Secondary | ICD-10-CM

## 2021-05-21 DIAGNOSIS — K529 Noninfective gastroenteritis and colitis, unspecified: Secondary | ICD-10-CM | POA: Diagnosis not present

## 2021-05-21 DIAGNOSIS — I10 Essential (primary) hypertension: Secondary | ICD-10-CM

## 2021-05-21 DIAGNOSIS — R140 Abdominal distension (gaseous): Secondary | ICD-10-CM | POA: Diagnosis not present

## 2021-05-21 MED ORDER — CIPROFLOXACIN HCL 500 MG PO TABS: 500.0000 mg | ORAL_TABLET | Freq: Two times a day (BID) | ORAL | 0 refills | Status: DC

## 2021-05-21 NOTE — Progress Notes (Signed)
? ? ?Provider:  ?Alain Honey, MD ? ?Careteam: ?Patient Care Team: ?Wardell Honour, MD as PCP - General (Family Medicine) ?Martinique, Peter M, MD as PCP - Cardiology (Cardiology) ?Deboraha Sprang, MD as PCP - Electrophysiology (Cardiology) ?Viona Gilmore, [facility] as Pharmacist (Pharmacist) ? ?PLACE OF SERVICE:  ?[facility]  ?Advanced Directive information ?  ? ?Allergies  ?Allergen Reactions  ? Ezetimibe Other (See Comments)  ?  Joint pain and drops BP  ? Niacin Other (See Comments)  ?  Drops BP and severe  ? Statins Other (See Comments)  ?  Severe joint pain and drops BP.  ? ? ?Chief Complaint  ?Patient presents with  ? Medical Management of Chronic Issues  ?  Patient presents today for a 4 weeks follow-up.  ? Quality Metric Gaps  ?  Zoster  ? ? ? ?HPI: Patient is a 66 y.o. male  ? ?Review of Systems:  ?Review of Systems  ?Constitutional:  Positive for malaise/fatigue.  ?Cardiovascular: Negative.   ?Musculoskeletal:  Positive for joint pain.  ?All other systems reviewed and are negative. ? ?Past Medical History:  ?Diagnosis Date  ? AICD (automatic cardioverter/defibrillator) present   ? Anxiety   ? Arthritis   ? "mild; back, neck, spine" (06/09/2017)  ? CHF (congestive heart failure) ([place])   ? Chronic back pain   ? Coronary artery disease   ? Dyspnea   ? GERD (gastroesophageal reflux disease)   ? History of gout   ? History of kidney stones X 2  ? Hypercholesterolemia   ? Hypertension   ? LBBB (left bundle branch block)   ? MI (myocardial infarction) ([place]) 1993  ? LATERAL  ? MI (myocardial infarction) ([place]) ?09/2001; 06/07/2017  ? Pneumonia   ? "several times" (06/09/2017)  ? S/P coronary artery stent placement   ? RCA  ? Varicose veins   ? ?Past Surgical History:  ?Procedure Laterality Date  ? ANTERIOR CERVICAL DECOMP/DISCECTOMY FUSION  03/03/2011  ? Procedure: ANTERIOR CERVICAL DECOMPRESSION/DISCECTOMY FUSION 3 LEVELS;  Surgeon: Floyce Stakes, MD;  Location: [facility];  Service: Neurosurgery;  Laterality: N/A;   Cervical three-four Cervical four-five Cervical five-six Anterior cervical decompression/diskectomy, fusion  ? APPLICATION OF ROBOTIC ASSISTANCE FOR SPINAL PROCEDURE N/A 10/03/2020  ? Procedure: APPLICATION OF ROBOTIC ASSISTANCE FOR SPINAL PROCEDURE;  Surgeon: Consuella Lose, MD;  Location: [place];  Service: Neurosurgery;  Laterality: N/A;  ? BACK SURGERY    ? CARDIAC CATHETERIZATION  2009  ? Stents in RCA patent. 70 to 80% PL, and 50% ostial PD.   ? CARDIAC CATHETERIZATION N/A 11/20/2014  ? Procedure: Left Heart Cath and Coronary Angiography;  Surgeon: Peter M Martinique, MD;  Location: [place] [facility];  Service: Cardiovascular;  Laterality: N/A;  ? CORONARY ANGIOPLASTY    ? DIRECT ANGIOPLASTY THE MARGINAL BRANCH  ? CORONARY ANGIOPLASTY WITH STENT PLACEMENT    ? RCA  ? CORONARY ARTERY BYPASS GRAFT N/A 06/14/2017  ? Procedure: CORONARY ARTERY BYPASS GRAFTING (CABG) x four, using left internal mammary artery and right leg greater saphenous vein harvested endoscopically;  Surgeon: Ivin Poot, MD;  Location: [place];  Service: Open Heart Surgery;  Laterality: N/A;  ? CYSTOSCOPY N/A 06/14/2017  ? Procedure: CYSTOSCOPY;  Surgeon: Ivin Poot, MD;  Location: [place];  Service: Open Heart Surgery;  Laterality: N/A;  ? ICD IMPLANT N/A 03/11/2018  ? Procedure: ICD IMPLANT - Dual Chamber;  Surgeon: Deboraha Sprang, MD;  Location: [place] [facility];  Service: Cardiovascular;  Laterality: N/A;  ? INSERTION OF SUPRAPUBIC CATHETER N/A 06/14/2017  ? Procedure: INSERTION OF SUPRAPUBIC CATHETER - Lower abdomen;  Surgeon: Ivin Poot, MD;  Location: [place];  Service: Open Heart Surgery;  Laterality: N/A;  ? POSTERIOR LUMBAR FUSION  10/2011  ? RIGHT/LEFT HEART CATH AND CORONARY ANGIOGRAPHY N/A 06/08/2017  ? Procedure: RIGHT/LEFT HEART CATH AND CORONARY ANGIOGRAPHY;  Surgeon: Martinique, Peter M, MD;  Location: [place] [facility];  Service: Cardiovascular;  Laterality: N/A;  ? RIGHT/LEFT HEART CATH AND CORONARY/GRAFT ANGIOGRAPHY N/A  09/06/2020  ? Procedure: RIGHT/LEFT HEART CATH AND CORONARY/GRAFT ANGIOGRAPHY;  Surgeon: Martinique, Peter M, MD;  Location: [place] [facility];  Service: Cardiovascular;  Laterality: N/A;  ? TEE WITHOUT CARDIOVERSION N/A 06/14/2017  ? Procedure: TRANSESOPHAGEAL ECHOCARDIOGRAM (TEE);  Surgeon: Prescott Gum, Collier Salina, MD;  Location: [place];  Service: Open Heart Surgery;  Laterality: N/A;  ? URETHROPLASTY N/A 10/15/2017  ? Procedure: URETHROPLASTY WITH BUCCAL GRAFT HARVAST;  Surgeon: Ardis Hughs, MD;  Location: [facility];  Service: Urology;  Laterality: N/A;  ? ?Social History: ?  reports that he quit smoking about 22 years ago. His smoking use included cigarettes. He has a 40.00 pack-year smoking history. He has never used smokeless tobacco. He reports that he does not currently use alcohol. He reports that he does not use drugs. ? ?Family History  ?Problem Relation Age of Onset  ? Breast cancer Mother   ? Coronary artery disease Mother   ? Stroke Father   ? Heart attack Father   ? Diabetes Father   ? Heart attack Brother   ? Heart disease Brother   ? Heart attack Maternal Grandfather   ? ? ?Medications: ?Patient's Medications  ?New Prescriptions  ? CIPROFLOXACIN (CIPRO) 500 MG TABLET    Take 1 tablet (500 mg total) by mouth 2 (two) times daily.  ?Previous Medications  ? ACETAMINOPHEN (TYLENOL) 500 MG TABLET    Take 1,000 mg by mouth every 4 (four) hours as needed. Morning & noon  ? ASPIRIN 81 MG EC TABLET    Take 81 mg by mouth in the morning.  ? CARVEDILOL (COREG) 6.25 MG TABLET    Take 0.5 tablets (3.125 mg total) by mouth 2 (two) times daily with a meal. Must have office visit for additional refills [phone]  ? DICYCLOMINE (BENTYL) 20 MG TABLET    Take 1 tablet (20 mg total) by mouth 2 (two) times daily.  ? EMPAGLIFLOZIN (JARDIANCE) 10 MG TABS TABLET    Take 1 tablet (10 mg total) by mouth daily before breakfast.  ? ENTRESTO 24-26 MG    TAKE 1/2 TABLET BY MOUTH 2 TIMES DAILY  ? ESOMEPRAZOLE (NEXIUM) 20 MG CAPSULE     TAKE 1 CAPSULE BY MOUTH EVERY DAY  ? FUROSEMIDE (LASIX) 40 MG TABLET    Take 1 tablet (40 mg total) by mouth daily.  ? GLUCOSAMINE-CHONDROITIN (OSTEO BI-FLEX REGULAR STRENGTH PO)    Take 1 tablet by mouth 2 (two) times daily. Morning & noon  ? ISOSORBIDE DINITRATE (ISORDIL) 30 MG TABLET    Take 30 mg by mouth daily.  ? LORATADINE (CLARITIN) 10 MG TABLET    Take 10 mg by mouth daily.  ? MULTIPLE VITAMIN (MULTIVITAMIN WITH MINERALS) TABS TABLET    Take 1 tablet by mouth in the morning and at bedtime. [employer]  ? ONDANSETRON (ZOFRAN) 4 MG TABLET    Take 1 tablet (4 mg total) by mouth every 6 (six) hours.  ?  PROBIOTIC PRODUCT (PROBIOTIC DAILY) CAPS    Take 1 capsule by mouth in the morning and at bedtime.  ? PSYLLIUM (HYDROCIL/METAMUCIL) 95 % PACK    Take 1 packet by mouth daily.  ? REPATHA SURECLICK [id number] MG/ML SOAJ    INJECT INTO SKIN EVERY 14 DAYS  ? ZOLPIDEM (AMBIEN) 10 MG TABLET    Take 1 tablet (10 mg total) by mouth at bedtime.  ?Modified Medications  ? No medications on file  ?Discontinued Medications  ? No medications on file  ? ? ?Physical Exam: ? ?Vitals:  ? 05/21/21 1002  ?BP: 132/84  ?Pulse: 87  ?Temp: 97.7 ?F (36.5 ?C)  ?SpO2: 96%  ?Weight: 240 lb 3.2 oz (109 kg)  ?Height: 6\' 2"  (1.88 m)  ? ?Body mass index is 30.84 kg/m?. ?Wt Readings from Last 3 Encounters:  ?05/21/21 240 lb 3.2 oz (109 kg)  ?05/15/21 223 lb 15.8 oz (101.6 kg)  ?05/08/21 224 lb (101.6 kg)  ? ? ?Physical Exam ?Vitals and nursing note reviewed.  ?Constitutional:   ?   Appearance: Normal appearance.  ?Cardiovascular:  ?   Rate and Rhythm: Normal rate.  ?Pulmonary:  ?   Effort: Pulmonary effort is normal.  ?   Breath sounds: Normal breath sounds.  ?Musculoskeletal:     ?   General: No swelling or deformity. Normal range of motion.  ?Neurological:  ?   General: No focal deficit present.  ?   Mental Status: He is alert.  ? ? ?Labs reviewed: ?Basic Metabolic Panel: ?Recent Labs  ?  09/03/20 ?1420 09/06/20 ?0752 10/04/20 ?0330 11/14/20 ?[id number]  05/15/21 ?1545  ?NA  --    < > 135 140 137  ?K  --    < > 4.6 4.7 3.7  ?CL  --    < > 99 101 99  ?CO2  --    < > 27 27 28   ?GLUCOSE  --    < > 139* 128* 119*  ?BUN  --    < > 20 16 13   ?CREATININE  --

## 2021-05-23 ENCOUNTER — Ambulatory Visit (INDEPENDENT_AMBULATORY_CARE_PROVIDER_SITE_OTHER): Payer: PPO | Admitting: Physician Assistant

## 2021-05-23 ENCOUNTER — Encounter: Payer: Self-pay | Admitting: Physician Assistant

## 2021-05-23 ENCOUNTER — Telehealth: Payer: Self-pay | Admitting: Cardiology

## 2021-05-23 VITALS — BP 120/70 | HR 86 | Ht 74.0 in | Wt 232.0 lb

## 2021-05-23 DIAGNOSIS — I25810 Atherosclerosis of coronary artery bypass graft(s) without angina pectoris: Secondary | ICD-10-CM | POA: Diagnosis not present

## 2021-05-23 DIAGNOSIS — Z95810 Presence of automatic (implantable) cardiac defibrillator: Secondary | ICD-10-CM

## 2021-05-23 DIAGNOSIS — M7989 Other specified soft tissue disorders: Secondary | ICD-10-CM | POA: Diagnosis not present

## 2021-05-23 DIAGNOSIS — E785 Hyperlipidemia, unspecified: Secondary | ICD-10-CM

## 2021-05-23 DIAGNOSIS — R109 Unspecified abdominal pain: Secondary | ICD-10-CM

## 2021-05-23 DIAGNOSIS — I5022 Chronic systolic (congestive) heart failure: Secondary | ICD-10-CM

## 2021-05-23 DIAGNOSIS — I10 Essential (primary) hypertension: Secondary | ICD-10-CM

## 2021-05-23 DIAGNOSIS — N390 Urinary tract infection, site not specified: Secondary | ICD-10-CM | POA: Diagnosis not present

## 2021-05-23 MED ORDER — FUROSEMIDE 20 MG PO TABS: 20.0000 mg | ORAL_TABLET | Freq: Every day | ORAL | 3 refills | Status: DC

## 2021-05-23 NOTE — Patient Instructions (Addendum)
Medication Instructions:  RESTART Lasix 20 mg daily  *If you need a refill on your cardiac medications before your next appointment, please call your pharmacy*  Lab Work: NONE ordered at this time of appointment   If you have labs (blood work) drawn today and your tests are completely normal, you will receive your results only by: [place] (if you have MyChart) OR A paper copy in the mail If you have any lab test that is abnormal or we need to change your treatment, we will call you to review the results.  Testing/Procedures: Your physician has requested that you have an echocardiogram. Echocardiography is a painless test that uses sound waves to create images of your heart. It provides your doctor with information about the size and shape of your heart and how well your heart's chambers and valves are working. This procedure takes approximately one hour. There are no restrictions for this procedure. This test is performed at [address] [place], [place] [address]  Please schedule for 1 month prior to follow up appointment   Follow-Up: At [facility], you and your health needs are our priority.  As part of our continuing mission to provide you with exceptional heart care, we have created designated Provider Care Teams.  These Care Teams include your primary Cardiologist (physician) and Advanced Practice Providers (APPs -  Physician Assistants and Nurse Practitioners) who all work together to provide you with the care you need, when you need it.  We recommend signing up for the patient portal called "MyChart".  Sign up information is provided on this After Visit Summary.  MyChart is used to connect with patients for Virtual Visits (Telemedicine).  Patients are able to view lab/test results, encounter notes, upcoming appointments, etc.  Non-urgent messages can be sent to your provider as well.   To learn more about what you can do with MyChart, go to [email or web address].     Your next appointment:   1 month(s)  The format for your next appointment:   In Person  Provider:   Peter Martinique, MD  or  APP         Other Instructions   Important Information About Sugar

## 2021-05-23 NOTE — Progress Notes (Signed)
Cardiology Office Note:    Date:  05/25/2021   ID:  [name], DOB August 21, 1955, MRN [id number]  PCP:  Frederica Kuster, MD   [facility] Providers Cardiologist:  Peter Swaziland, MD Electrophysiologist:  Sherryl Manges, MD     Referring MD: [name], PA-C   Chief Complaint  Patient presents with   Follow-up    4 weeks.   Headache   Shortness of Breath   Edema    Feet and legs.    History of Present Illness:    [name] is a 66 y.o. male with a hx of CAD s/p CABG, HTN, HLD, LBBB and chronic systolic heart failure.  Patient was admitted in June 2019 with flash pulmonary edema and was ruled in for NSTEMI.  Cardiac catheterization at the time showed severe three-vessel CAD.  He eventually underwent CABG by Dr. Donata Clay with LIMA-LAD, SVG-diagonal, SVG-ramus intermediate, and SVG-RCA.  He had a syncope in June 2019 while waiting in urology office due to urethral stricture.  He was noted to have wide-complex tachycardia on telemetry with heart rate 170.  EF was 35 to 40%.  Lower extremity venous Doppler was negative for DVT.  CTA of the chest negative for PE.  Patient was seen by EP who felt underlying rhythm could be either VT versus SVT.  He was fitted with LifeVest.  Medication cut back due to hypotension and orthostasis.  Repeat echocardiogram in September 2019 showed persistently low EF of 25 to 30%, he was unable to tolerate Entresto, spironolactone and carvedilol due to orthostatic symptoms and lightheadedness.  Patient eventually underwent biventricular ICD in March 2020 by Dr. Graciela Husbands after failing another trial of medical therapy.  Repeat echocardiogram in July 2020 showed EF improved to 40 to 45%.  Patient was seen in August 2022 for chest pain and increased dyspnea.  Myoview was ordered which showed EF dropped down to 17% with large inferolateral scar and anterior apical ischemia, repeat echocardiogram showed EF 40%.  Cardiac catheterization showed occluded SVG to  ramus however other grafts were patent.  He was last seen by Dr. Swaziland on 04/16/2021 at which time he had increased swelling in the ankle and feet.  Lasix 40 mg daily was added to his medical regimen.  Jardiance 10 mg was also added.  Initial urinalysis did not show significant bacteria, however urine culture grew E. coli, he was instructed to follow-up with his urologist.  He went to the ED for shortness of breath and abdominal pain on 5/11.  Blood work shows normal creatinine, BNP 325 D-dimer 1.11 large glucoside and rare bacteria urinalysis.  High-sensitivity troponin borderline at 21 --> 22.  Chest x-ray showed mild pulmonary edema with small left pleural effusion.  CTA of the chest that revealed 4.2 cm dilated ascending thoracic aorta, chronic atelectasis in the left lung base, chronic emphysematous change, distended gallbladder, severe compression deformity of T12.  Ultrasound of abdomen right upper quadrant revealed a distended gallbladder suggestive of cholestasis, no finding to suggest acute cholecystitis.  Normal liver and normal common bile duct.  Since then, he has been seen by PCP on 5/17 and was started on 1 week course of ciprofloxacin.  Patient presents today for follow-up.  He has multiple complaints.  Lower extremity edema still is present and that is worse by the end of the day or if he is sitting still for long period of time.  The way he described lower extremity edema sounds more like venous  stasis.  Right lower extremity tend to be more swollen than the left lower extremity.  He has a history of vein harvesting in the right lower extremity.  Since his PCP started on ciprofloxacin, he is suprapubic/lower abdomen and lower back pain has improved.  Unfortunately because his lower extremity edema did not improve, he essentially stopped several medications as a trial to see if they may be contributing to the edema.  Medications he stopped include Jardiance, Lasix, isosorbide dinitrate.  He is  still taking aspirin, carvedilol, Repatha and Entresto.  He says he has to go to the bathroom 12 times a day on the Jardiance and Lasix.  I urged him to restart the Lasix even at a lower dose of 20 mg daily.  I recommended a limited echocardiogram given the recurrent lower extremity edema and mild heart failure seen on recent chest x-ray.  I recommended 1 month follow-up.  Past Medical History:  Diagnosis Date   AICD (automatic cardioverter/defibrillator) present    Anxiety    Arthritis    "mild; back, neck, spine" (06/09/2017)   CHF (congestive heart failure) (HCC)    Chronic back pain    Coronary artery disease    Dyspnea    GERD (gastroesophageal reflux disease)    History of gout    History of kidney stones X 2   Hypercholesterolemia    Hypertension    LBBB (left bundle branch block)    MI (myocardial infarction) (HCC) 1993   LATERAL   MI (myocardial infarction) (HCC) ?09/2001; 06/07/2017   Pneumonia    "several times" (06/09/2017)   S/P coronary artery stent placement    RCA   Varicose veins     Past Surgical History:  Procedure Laterality Date   ANTERIOR CERVICAL DECOMP/DISCECTOMY FUSION  03/03/2011   Procedure: ANTERIOR CERVICAL DECOMPRESSION/DISCECTOMY FUSION 3 LEVELS;  Surgeon: Karn Cassis, MD;  Location: [facility]UR[facility] ORS;  Service: Neurosurgery;  Laterality: N/A;  Cervical three-four Cervical four-five Cervical five-six Anterior cervical decompression/diskectomy, fusion   APPLICATION OF ROBOTIC ASSISTANCE FOR SPINAL PROCEDURE N/A 10/03/2020   Procedure: APPLICATION OF ROBOTIC ASSISTANCE FOR SPINAL PROCEDURE;  Surgeon: Lisbeth Renshaw, MD;  Location: [facility] OR;  Service: Neurosurgery;  Laterality: N/A;   BACK SURGERY     CARDIAC CATHETERIZATION  2009   Stents in RCA patent. 70 to 80% PL, and 50% ostial PD.    CARDIAC CATHETERIZATION N/A 11/20/2014   Procedure: Left Heart Cath and Coronary Angiography;  Surgeon: Peter M Swaziland, MD;  Location: [facility] INVASIVE CV LAB;  Service:  Cardiovascular;  Laterality: N/A;   CORONARY ANGIOPLASTY     DIRECT ANGIOPLASTY THE MARGINAL BRANCH   CORONARY ANGIOPLASTY WITH STENT PLACEMENT     RCA   CORONARY ARTERY BYPASS GRAFT N/A 06/14/2017   Procedure: CORONARY ARTERY BYPASS GRAFTING (CABG) x four, using left internal mammary artery and right leg greater saphenous vein harvested endoscopically;  Surgeon: Kerin Perna, MD;  Location: [facility] OR;  Service: Open Heart Surgery;  Laterality: N/A;   CYSTOSCOPY N/A 06/14/2017   Procedure: CYSTOSCOPY;  Surgeon: Kerin Perna, MD;  Location: [facility] OR;  Service: Open Heart Surgery;  Laterality: N/A;   ICD IMPLANT N/A 03/11/2018   Procedure: ICD IMPLANT - Dual Chamber;  Surgeon: [name], MD;  Location: [facility] INVASIVE CV LAB;  Service: Cardiovascular;  Laterality: N/A;   INSERTION OF SUPRAPUBIC CATHETER N/A 06/14/2017   Procedure: INSERTION OF SUPRAPUBIC CATHETER - Lower abdomen;  Surgeon: Kerin Perna, MD;  Location:  [facility] OR;  Service: Open Heart Surgery;  Laterality: N/A;   POSTERIOR LUMBAR FUSION  10/2011   RIGHT/LEFT HEART CATH AND CORONARY ANGIOGRAPHY N/A 06/08/2017   Procedure: RIGHT/LEFT HEART CATH AND CORONARY ANGIOGRAPHY;  Surgeon: Swaziland, Peter M, MD;  Location: [facility] INVASIVE CV LAB;  Service: Cardiovascular;  Laterality: N/A;   RIGHT/LEFT HEART CATH AND CORONARY/GRAFT ANGIOGRAPHY N/A 09/06/2020   Procedure: RIGHT/LEFT HEART CATH AND CORONARY/GRAFT ANGIOGRAPHY;  Surgeon: Swaziland, Peter M, MD;  Location: [facility] INVASIVE CV LAB;  Service: Cardiovascular;  Laterality: N/A;   TEE WITHOUT CARDIOVERSION N/A 06/14/2017   Procedure: TRANSESOPHAGEAL ECHOCARDIOGRAM (TEE);  Surgeon: Donata Clay, Theron Arista, MD;  Location: [facility] OR;  Service: Open Heart Surgery;  Laterality: N/A;   URETHROPLASTY N/A 10/15/2017   Procedure: URETHROPLASTY WITH BUCCAL GRAFT HARVAST;  Surgeon: Crist Fat, MD;  Location: [facility] ORS;  Service: Urology;  Laterality: N/A;    Current Medications: Current Meds  Medication Sig    acetaminophen (TYLENOL) 500 MG tablet Take 1,000 mg by mouth every 4 (four) hours as needed. Morning & noon   aspirin 81 MG EC tablet Take 81 mg by mouth in the morning.   carvedilol (COREG) 6.25 MG tablet Take 0.5 tablets (3.125 mg total) by mouth 2 (two) times daily with a meal. Must have office visit for additional refills [phone]   dicyclomine (BENTYL) 20 MG tablet Take 1 tablet (20 mg total) by mouth 2 (two) times daily.   ENTRESTO 24-26 MG TAKE 1/2 TABLET BY MOUTH 2 TIMES DAILY   furosemide (LASIX) 20 MG tablet Take 1 tablet (20 mg total) by mouth daily.   REPATHA SURECLICK 140 MG/ML SOAJ INJECT INTO SKIN EVERY 14 DAYS   zolpidem (AMBIEN) 10 MG tablet Take 1 tablet (10 mg total) by mouth at bedtime.   [DISCONTINUED] ciprofloxacin (CIPRO) 500 MG tablet Take 1 tablet (500 mg total) by mouth 2 (two) times daily.   [DISCONTINUED] empagliflozin (JARDIANCE) 10 MG TABS tablet Take 1 tablet (10 mg total) by mouth daily before breakfast.   [DISCONTINUED] esomeprazole (NEXIUM) 20 MG capsule TAKE 1 CAPSULE BY MOUTH EVERY DAY   [DISCONTINUED] furosemide (LASIX) 40 MG tablet Take 1 tablet (40 mg total) by mouth daily.   [DISCONTINUED] Glucosamine-Chondroitin (OSTEO BI-FLEX REGULAR STRENGTH PO) Take 1 tablet by mouth 2 (two) times daily. Morning & noon   [DISCONTINUED] isosorbide dinitrate (ISORDIL) 30 MG tablet Take 30 mg by mouth daily.   [DISCONTINUED] loratadine (CLARITIN) 10 MG tablet Take 10 mg by mouth daily.   [DISCONTINUED] Multiple Vitamin (MULTIVITAMIN WITH MINERALS) TABS tablet Take 1 tablet by mouth in the morning and at bedtime. [employer]   [DISCONTINUED] ondansetron (ZOFRAN) 4 MG tablet Take 1 tablet (4 mg total) by mouth every 6 (six) hours.   [DISCONTINUED] Probiotic Product (PROBIOTIC DAILY) CAPS Take 1 capsule by mouth in the morning and at bedtime.   [DISCONTINUED] psyllium (HYDROCIL/METAMUCIL) 95 % PACK Take 1 packet by mouth daily.     Allergies:   Ezetimibe, Niacin, and  Statins   Social History   Socioeconomic History   Marital status: Married    Spouse name: Not on file   Number of children: 1   Years of education: Not on file   Highest education level: Not on file  Occupational History   Occupation: Truck Airline pilot  Tobacco Use   Smoking status: Former    Packs/day: 2.00    Years: 20.00    Pack years: 40.00    Types: Cigarettes  Quit date: 05/07/1999    Years since quitting: 22.0   Smokeless tobacco: Never  Vaping Use   Vaping Use: Never used  Substance and Sexual Activity   Alcohol use: Not Currently    Comment: 2-3 per month (occasionally)   Drug use: Never   Sexual activity: Not on file  Other Topics Concern   Not on file  Social History Narrative   Diet      Do you drink/eat things with caffeine  No      Marital Status Married What year were you married?  1987      Do you live in a house, apartment, assisted living, condo, trailer, etc.?  House      Is it one or more stories?  2 stories      How many persons live in your home?  2         Do you have any pets in your home?(please list)  No      Highest level of education completed:  11 Years      Current or past profession:  Sales-past      Do you exercise?:  YES    Type and how often:  Cardio and weights/At least everyday      Do you have a Living Will? (Form that indicates scenarios where you would not want your life prolonged)  YES      Do you have a DNR form?  YES       If not, would you like to discuss one?      Do you have signed POA/HPOA forms?  NO      Do you have difficulty bathing or dressing yourself?  NO      Do you have difficulty preparing food or eating?  NO      Do you have difficulty managing medications?  NO      Do you have difficulty managing your finances?  NO      Do you have difficulty affording your medications?  NO                     Social Determinants of Corporate investment banker Strain: Low Risk    Difficulty of Paying Living  Expenses: Not very hard  Food Insecurity: Not on file  Transportation Needs: No Transportation Needs   Lack of Transportation (Medical): No   Lack of Transportation (Non-Medical): No  Physical Activity: Not on file  Stress: Not on file  Social Connections: Not on file     Family History: The patient's family history includes Breast cancer in his mother; Coronary artery disease in his mother; Diabetes in his father; Heart attack in his brother, father, and maternal grandfather; Heart disease in his brother; Stroke in his father.  ROS:   Please see the history of present illness.     All other systems reviewed and are negative.  EKGs/Labs/Other Studies Reviewed:    The following studies were reviewed today:  Echo 08/30/2020  1. Poor acoustic windows limit study. Definity used. LVEF is moderately  depressed with diffuse hypokinesis worse in the annteiror and lateral  walls. LVEF is approximately 40%. The left ventricle has moderately  decreased function. The left ventricular  internal cavity size was severely dilated. There is mild left ventricular  hypertrophy. Indeterminate diastolic filling due to E-A fusion.   2. Right ventricular systolic function is normal. The right ventricular  size is normal.   3. Mild mitral valve regurgitation.  4. Aortic valve regurgitation is mild. Mild aortic valve sclerosis is  present, with no evidence of aortic valve stenosis.   5. Aortic dilatation noted. There is mild dilatation of the aortic root,  measuring 42 mm.   Comparison(s): The left ventricular function is unchanged.   EKG:  EKG is not ordered today.    Recent Labs: 09/03/2020: TSH 1.830 05/15/2021: ALT 42; B Natriuretic Peptide 325.4; BUN 13; Creatinine, Ser 0.95; Hemoglobin 13.6; Platelets 217; Potassium 3.7; Sodium 137  Recent Lipid Panel    Component Value Date/Time   CHOL 153 08/30/2020 0828   TRIG 168 (H) 08/30/2020 0828   HDL 54 08/30/2020 0828   CHOLHDL 2.8 08/30/2020  0828   CHOLHDL 2.6 10/29/2017 0939   VLDL 27 10/29/2017 0939   LDLCALC 71 08/30/2020 0828     Risk Assessment/Calculations:           Physical Exam:    VS:  BP 120/70 (BP Location: Left Arm, Patient Position: Sitting, Cuff Size: Normal)   Pulse 86   Ht  (1.88 m)   Wt 232 lb (105.2 kg)   BMI 29.79 kg/m     Wt Readings from Last 3 Encounters:  05/23/21 232 lb (105.2 kg)  05/21/21 240 lb 3.2 oz (109 kg)  05/15/21 223 lb 15.8 oz (101.6 kg)     GEN:  Well nourished, well developed in no acute distress HEENT: Normal NECK: No JVD; No carotid bruits LYMPHATICS: No lymphadenopathy CARDIAC: RRR, no murmurs, rubs, gallops RESPIRATORY:  Clear to auscultation without rales, wheezing or rhonchi  ABDOMEN: Soft, non-tender, non-distended MUSCULOSKELETAL:  1+ edema; No deformity  SKIN: Warm and dry NEUROLOGIC:  Alert and oriented x 3 PSYCHIATRIC:  Normal affect   ASSESSMENT:    1. Leg swelling   2. Coronary artery disease involving coronary bypass graft of native heart without angina pectoris   3. Essential hypertension   4. Hyperlipidemia LDL goal <70   5. Chronic systolic heart failure (HCC)   6. Biventricular implantable cardioverter-defibrillator in situ   7. Urinary tract infection without hematuria, site unspecified   8. Abdominal pain, unspecified abdominal location    PLAN:    In order of problems listed above:  Lower extremity edema: He describes lower extremity edema that is worse by the end of the day when he is sitting around and better in the morning.  I suspect this is due to venous stasis.  His right lower extremity tend to be more swollen than the left lower extremity, he has a history of vein harvesting at this time of previous bypass surgery on the right side.  Unfortunately, because lower extremity edema did not improve, he has decided to stop Jardiance and Lasix, he says those medications made him going to the bathroom too often.  I urged him to at least  restart Lasix at 20 mg daily  CAD s/p CABG: Denies any recent chest pain.  He does have dyspnea on exertion.  Previous cardiac catheterization in August 2022 showed stable anatomy.  Hypertension: Blood pressure stable  Hyperlipidemia: On Repatha  Chronic systolic heart failure: Ejection fraction improved after biventricular ICD implantation however given the recent lower extremity edema, I recommended a limited echocardiogram  History of biventricular ICD: Managed by EP service  Recent urinary tract infection: He has been started on ciprofloxacin by his PCP, since initiation of antibiotic, his lower abdominal and back pain seems to have improved  Abdominal pain: Recently went to the the ED for abdominal  pain, unfortunately it is still present.  He describes the location suprapubic and lower abdominal discomfort.  Since starting antibiotic for urinary tract infection, symptom has slightly improved.  We will continue observation for now.           Medication Adjustments/Labs and Tests Ordered: Current medicines are reviewed at length with the patient today.  Concerns regarding medicines are outlined above.  Orders Placed This Encounter  Procedures   ECHOCARDIOGRAM LIMITED   Meds ordered this encounter  Medications   furosemide (LASIX) 20 MG tablet    Sig: Take 1 tablet (20 mg total) by mouth daily.    Dispense:  90 tablet    Refill:  3    Patient Instructions  Medication Instructions:  RESTART Lasix 20 mg daily  *If you need a refill on your cardiac medications before your next appointment, please call your pharmacy*  Lab Work: NONE ordered at this time of appointment   If you have labs (blood work) drawn today and your tests are completely normal, you will receive your results only by: MyChart Message (if you have MyChart) OR A paper copy in the mail If you have any lab test that is abnormal or we need to change your treatment, we will call you to review the  results.  Testing/Procedures: Your physician has requested that you have an echocardiogram. Echocardiography is a painless test that uses sound waves to create images of your heart. It provides your doctor with information about the size and shape of your heart and how well your heart's chambers and valves are working. This procedure takes approximately one hour. There are no restrictions for this procedure. This test is performed at [address] [place], [place] [address]  Please schedule for 1 month prior to follow up appointment   Follow-Up: At [facility], you and your health needs are our priority.  As part of our continuing mission to provide you with exceptional heart care, we have created designated Provider Care Teams.  These Care Teams include your primary Cardiologist (physician) and Advanced Practice Providers (APPs -  Physician Assistants and Nurse Practitioners) who all work together to provide you with the care you need, when you need it.  We recommend signing up for the patient portal called "MyChart".  Sign up information is provided on this After Visit Summary.  MyChart is used to connect with patients for Virtual Visits (Telemedicine).  Patients are able to view lab/test results, encounter notes, upcoming appointments, etc.  Non-urgent messages can be sent to your provider as well.   To learn more about what you can do with MyChart, go to [email or web address].    Your next appointment:   1 month(s)  The format for your next appointment:   In Person  Provider:   Peter Swaziland, MD  or  APP         Other Instructions   Important Information About Sugar         Ramond Dial, [place]  05/25/2021 11:28 PM    [facility]

## 2021-05-23 NOTE — Telephone Encounter (Signed)
Pt c/o medication issue:  1. Name of Medication:   furosemide (LASIX) 20 MG tablet    2. How are you currently taking this medication (dosage and times per day)?  Take 1 tablet (20 mg total) by mouth daily. 3. Are you having a reaction (difficulty breathing--STAT)? No  4. What is your medication issue? Pharmacy would like to know if there has been a dose change being that pt has been on 40 mg. Please adise

## 2021-05-23 NOTE — Telephone Encounter (Signed)
Clarified with patient's pharmacy that patient is to take furosemide 20 mg daily; not combined with furosemide 40 mg. The 40 mg was discontinued.

## 2021-05-26 LAB — LYME DISEASE ANTIBODY WITH REFLEX TO IMMUNOASSAY (IGG, IGM): LYME AB, SCREEN: <=0.9 Index (ref <=0.90)

## 2021-06-11 DIAGNOSIS — R197 Diarrhea, unspecified: Secondary | ICD-10-CM | POA: Diagnosis not present

## 2021-06-16 ENCOUNTER — Ambulatory Visit (HOSPITAL_COMMUNITY): Payer: PPO | Attending: Cardiovascular Disease

## 2021-06-16 ENCOUNTER — Telehealth: Payer: Self-pay | Admitting: Cardiology

## 2021-06-16 DIAGNOSIS — Z95810 Presence of automatic (implantable) cardiac defibrillator: Secondary | ICD-10-CM | POA: Insufficient documentation

## 2021-06-16 DIAGNOSIS — I509 Heart failure, unspecified: Secondary | ICD-10-CM | POA: Diagnosis not present

## 2021-06-16 DIAGNOSIS — I252 Old myocardial infarction: Secondary | ICD-10-CM | POA: Insufficient documentation

## 2021-06-16 DIAGNOSIS — I110 Hypertensive heart disease with heart failure: Secondary | ICD-10-CM | POA: Insufficient documentation

## 2021-06-16 DIAGNOSIS — M7989 Other specified soft tissue disorders: Secondary | ICD-10-CM | POA: Diagnosis not present

## 2021-06-16 DIAGNOSIS — I340 Nonrheumatic mitral (valve) insufficiency: Secondary | ICD-10-CM | POA: Diagnosis not present

## 2021-06-16 DIAGNOSIS — I447 Left bundle-branch block, unspecified: Secondary | ICD-10-CM | POA: Insufficient documentation

## 2021-06-16 DIAGNOSIS — E785 Hyperlipidemia, unspecified: Secondary | ICD-10-CM | POA: Insufficient documentation

## 2021-06-16 DIAGNOSIS — I080 Rheumatic disorders of both mitral and aortic valves: Secondary | ICD-10-CM | POA: Diagnosis not present

## 2021-06-16 LAB — ECHOCARDIOGRAM LIMITED
Area-P 1/2: 9.98 cm2
P 1/2 time: 268 msec
S' Lateral: 5.5 cm

## 2021-06-16 MED ORDER — FUROSEMIDE 40 MG PO TABS: 40.0000 mg | ORAL_TABLET | Freq: Every day | ORAL | 3 refills | Status: DC

## 2021-06-16 NOTE — Progress Notes (Signed)
Pumping function of heart slightly down from last year's echo, however given the fact his cath in Sept 2022 showed no new target, suspect will treat the patient medically for now unless obvious angina symptom. Patient has upcoming visit in 4 days. CHF meds will need to be further titrated. Consider add low dose of spironolactone

## 2021-06-16 NOTE — Telephone Encounter (Signed)
Between healed vein graft sites on right leg he has had continuous swelling that lately (for a while now) his foot is swelling also to the point that he can't get his shoe on.  Dr. Swaziland prescribed Jardiance for fluid and it has helped some.  This seems to be better in mornings.  The leg is tender.     Echo was done today.  We discussed the possibility of it being venus stasis.  He elevates his legs as much as he can.    We talked about his meds, he had been prescribed 40 mg daily by Dr. Seward Grater in April but stopped it by the time he saw Wynema Birch last month.  Wynema Birch encouraged him to at least restart at 20 mg which he has been doing.  I adv he should return to the 40 mg tablet daily.  His last labs last week show kidney function wnl.    His Jardiance is $145 per month and he picked up a prescription for 30 days but cannot afford this monthly.  He dropped off pt assistance forms that were to be given to Dr. Elvis Coil nurse but has not heard back.  I adv he could obtain samples during this process if they are available.

## 2021-06-16 NOTE — Telephone Encounter (Signed)
Received call from patient who mentioned that he has been experiencing leg swelling. Please call to discuss with patient

## 2021-06-17 ENCOUNTER — Encounter: Payer: Self-pay | Admitting: Family Medicine

## 2021-06-17 ENCOUNTER — Ambulatory Visit (INDEPENDENT_AMBULATORY_CARE_PROVIDER_SITE_OTHER): Payer: PPO | Admitting: Family Medicine

## 2021-06-17 VITALS — BP 130/86 | HR 83 | Temp 96.9°F | Ht 74.0 in | Wt 235.2 lb

## 2021-06-17 DIAGNOSIS — M25569 Pain in unspecified knee: Secondary | ICD-10-CM | POA: Diagnosis not present

## 2021-06-17 DIAGNOSIS — R350 Frequency of micturition: Secondary | ICD-10-CM | POA: Diagnosis not present

## 2021-06-17 DIAGNOSIS — M549 Dorsalgia, unspecified: Secondary | ICD-10-CM | POA: Diagnosis not present

## 2021-06-17 DIAGNOSIS — I10 Essential (primary) hypertension: Secondary | ICD-10-CM

## 2021-06-17 DIAGNOSIS — Z87440 Personal history of urinary (tract) infections: Secondary | ICD-10-CM

## 2021-06-17 LAB — POCT URINALYSIS DIPSTICK
Bilirubin, UA: NEGATIVE
Glucose, UA: POSITIVE — AB
Ketones, UA: NEGATIVE
Nitrite, UA: NEGATIVE
Protein, UA: NEGATIVE
Spec Grav, UA: 1.02 (ref 1.010–1.025)
Urobilinogen, UA: 0.2 E.U./dL
pH, UA: 6 (ref 5.0–8.0)

## 2021-06-17 NOTE — Progress Notes (Signed)
Provider:  Jacalyn Lefevre, MD  Careteam: Patient Care Team: Frederica Kuster, MD as PCP - General (Family Medicine) Swazil[place], Peter M, MD as PCP - Cardiology (Cardiology) [name], MD as PCP - Electrophysiology (Cardiology) Verner Chol, [facility] as Pharmacist (Pharmacist)  PLACE OF SERVICE:  [facility]  Advanced Directive information    Allergies  Allergen Reactions   Ezetimibe Other (See Comments)    Joint pain and drops BP   Niacin Other (See Comments)    Drops BP and severe   Statins Other (See Comments)    Severe joint pain and drops BP.    No chief complaint on file.    HPI: Patient is a 66 y.o. male .  Patient is here to follow-up bilateral lower extremity edema.  He has had 2 falls this past week due to right knee giving way.  At his last visit here was given Cipro for symptoms that were suspicious for prostatitis.  Symptoms seem to improve after 2-week course of Cipro.  Cardiology has restarted his furosemide and increased dose from 20 to 40 mg He also today complains of some inflammation in the glans.  He is uncircumcised. He has an appointment later today with orthopedics re: his knee  Review of Systems:  Review of Systems  HENT: Negative.    Respiratory: Negative.    Cardiovascular:  Positive for leg swelling.  Genitourinary:  Positive for frequency.  Musculoskeletal:  Positive for back pain.  Skin: Negative.   Neurological: Negative.   All other systems reviewed and are negative.   Past Medical History:  Diagnosis Date   AICD (automatic cardioverter/defibrillator) present    Anxiety    Arthritis    "mild; back, neck, spine" (06/09/2017)   CHF (congestive heart failure) (HCC)    Chronic back pain    Coronary artery disease    Dyspnea    GERD (gastroesophageal reflux disease)    History of gout    History of kidney stones X 2   Hypercholesterolemia    Hypertension    LBBB (left bundle branch block)    MI (myocardial infarction) (HCC)  1993   LATERAL   MI (myocardial infarction) (HCC) ?09/2001; 06/07/2017   Pneumonia    "several times" (06/09/2017)   S/P coronary artery stent placement    RCA   Varicose veins    Past Surgical History:  Procedure Laterality Date   ANTERIOR CERVICAL DECOMP/DISCECTOMY FUSION  03/03/2011   Procedure: ANTERIOR CERVICAL DECOMPRESSION/DISCECTOMY FUSION 3 LEVELS;  Surgeon: Karn Cassis, MD;  Location: [facility];  Service: Neurosurgery;  Laterality: N/A;  Cervical three-four Cervical four-five Cervical five-six Anterior cervical decompression/diskectomy, fusion   APPLICATION OF ROBOTIC ASSISTANCE FOR SPINAL PROCEDURE N/A 10/03/2020   Procedure: APPLICATION OF ROBOTIC ASSISTANCE FOR SPINAL PROCEDURE;  Surgeon: Lisbeth Renshaw, MD;  Location: [facility] OR;  Service: Neurosurgery;  Laterality: N/A;   BACK SURGERY     CARDIAC CATHETERIZATION  2009   Stents in RCA patent. 70 to 80% PL, and 50% ostial PD.    CARDIAC CATHETERIZATION N/A 11/20/2014   Procedure: Left Heart Cath and Coronary Angiography;  Surgeon: Peter M Swaziland, MD;  Location: [facility] INVASIVE CV LAB;  Service: Cardiovascular;  Laterality: N/A;   CORONARY ANGIOPLASTY     DIRECT ANGIOPLASTY THE MARGINAL BRANCH   CORONARY ANGIOPLASTY WITH STENT PLACEMENT     RCA   CORONARY ARTERY BYPASS GRAFT N/A 06/14/2017   Procedure: CORONARY ARTERY BYPASS GRAFTING (CABG) x four, using left  internal mammary artery and right leg greater saphenous vein harvested endoscopically;  Surgeon: Kerin Perna, MD;  Location: [facility] OR;  Service: Open Heart Surgery;  Laterality: N/A;   CYSTOSCOPY N/A 06/14/2017   Procedure: CYSTOSCOPY;  Surgeon: Kerin Perna, MD;  Location: [facility] OR;  Service: Open Heart Surgery;  Laterality: N/A;   ICD IMPLANT N/A 03/11/2018   Procedure: ICD IMPLANT - Dual Chamber;  Surgeon: [name], MD;  Location: [facility] INVASIVE CV LAB;  Service: Cardiovascular;  Laterality: N/A;   INSERTION OF SUPRAPUBIC CATHETER N/A 06/14/2017   Procedure:  INSERTION OF SUPRAPUBIC CATHETER - Lower abdomen;  Surgeon: Kerin Perna, MD;  Location: [facility] OR;  Service: Open Heart Surgery;  Laterality: N/A;   POSTERIOR LUMBAR FUSION  10/2011   RIGHT/LEFT HEART CATH AND CORONARY ANGIOGRAPHY N/A 06/08/2017   Procedure: RIGHT/LEFT HEART CATH AND CORONARY ANGIOGRAPHY;  Surgeon: Swaziland, Peter M, MD;  Location: [facility] INVASIVE CV LAB;  Service: Cardiovascular;  Laterality: N/A;   RIGHT/LEFT HEART CATH AND CORONARY/GRAFT ANGIOGRAPHY N/A 09/06/2020   Procedure: RIGHT/LEFT HEART CATH AND CORONARY/GRAFT ANGIOGRAPHY;  Surgeon: Swaziland, Peter M, MD;  Location: [facility] INVASIVE CV LAB;  Service: Cardiovascular;  Laterality: N/A;   TEE WITHOUT CARDIOVERSION N/A 06/14/2017   Procedure: TRANSESOPHAGEAL ECHOCARDIOGRAM (TEE);  Surgeon: Donata Clay, Theron Arista, MD;  Location: [facility] OR;  Service: Open Heart Surgery;  Laterality: N/A;   URETHROPLASTY N/A 10/15/2017   Procedure: URETHROPLASTY WITH BUCCAL GRAFT HARVAST;  Surgeon: Crist Fat, MD;  Location: [facility] ORS;  Service: Urology;  Laterality: N/A;   Social History:   reports that he quit smoking about 22 years ago. His smoking use included cigarettes. He has a 40.00 pack-year smoking history. He has never used smokeless tobacco. He reports that he does not currently use alcohol. He reports that he does not use drugs.  Family History  Problem Relation Age of Onset   Breast cancer Mother    Coronary artery disease Mother    Stroke Father    Heart attack Father    Diabetes Father    Heart attack Brother    Heart disease Brother    Heart attack Maternal Grandfather     Medications: Patient's Medications  New Prescriptions   No medications on file  Previous Medications   ACETAMINOPHEN (TYLENOL) 500 MG TABLET    Take 1,000 mg by mouth every 4 (four) hours as needed. Morning & noon   ASPIRIN 81 MG EC TABLET    Take 81 mg by mouth in the morning.   CARVEDILOL (COREG) 6.25 MG TABLET    Take 0.5 tablets (3.125 mg total) by mouth 2 (two)  times daily with a meal. Must have office visit for additional refills [phone]   DICYCLOMINE (BENTYL) 20 MG TABLET    Take 1 tablet (20 mg total) by mouth 2 (two) times daily.   EMPAGLIFLOZIN (JARDIANCE) 10 MG TABS TABLET    Take 10 mg by mouth daily.   ENTRESTO 24-26 MG    TAKE 1/2 TABLET BY MOUTH 2 TIMES DAILY   FUROSEMIDE (LASIX) 40 MG TABLET    Take 1 tablet (40 mg total) by mouth daily.   REPATHA SURECLICK 140 MG/ML SOAJ    INJECT INTO SKIN EVERY 14 DAYS   ZOLPIDEM (AMBIEN) 10 MG TABLET    Take 1 tablet (10 mg total) by mouth at bedtime.  Modified Medications   No medications on file  Discontinued Medications   No medications on file    Physical Exam:  Vitals:   06/17/21 0901  BP: 130/86  Pulse: 83  Temp: (!) 96.9 F (36.1 C)  SpO2: 96%  Weight: 235 lb 3.2 oz (106.7 kg)  Height: 6\' 2"  (1.88 m)   Body mass index is 30.2 kg/m. Wt Readings from Last 3 Encounters:  06/17/21 235 lb 3.2 oz (106.7 kg)  05/23/21 232 lb (105.2 kg)  05/21/21 240 lb 3.2 oz (109 kg)    Physical Exam Vitals and nursing note reviewed.  Constitutional:      Appearance: Normal appearance.  Cardiovascular:     Rate and Rhythm: Normal rate and regular rhythm.  Pulmonary:     Effort: Pulmonary effort is normal.     Breath sounds: Normal breath sounds.  Musculoskeletal:     Right lower leg: Edema present.     Left lower leg: Edema present.  Neurological:     General: No focal deficit present.     Mental Status: He is alert and oriented to person, place, and time.     Labs reviewed: Basic Metabolic Panel: Recent Labs    09/03/20 1420 09/06/20 0752 10/04/20 0330 11/14/20 0953 05/15/21 1545  NA  --    < > 135 140 137  K  --    < > 4.6 4.7 3.7  CL  --    < > 99 101 99  CO2  --    < > 27 27 28   GLUCOSE  --    < > 139* 128* 119*  BUN  --    < > 20 16 13   CREATININE  --    < > 0.89 0.99 0.95  CALCIUM  --    < > 8.6* 9.8 8.9  TSH 1.830  --   --   --   --    < > = values in this  interval not displayed.   Liver Function Tests: Recent Labs    07/31/20 1454 08/30/20 0828 05/15/21 1545  AST 17 22 39  ALT 22 21 42  ALKPHOS 51 58 51  BILITOT 1.0 0.6 1.2  PROT 7.3 6.6 6.5  ALBUMIN 4.5 4.6 4.4   Recent Labs    07/31/20 1454 05/15/21 1545  LIPASE 89* 12   No results for input(s): "AMMONIA" in the last 8760 hours. CBC: Recent Labs    07/06/20 1940 07/31/20 1454 08/30/20 0828 09/06/20 0752 10/04/20 0330 11/14/20 0953 05/15/21 1545  WBC 5.4   < > 4.9   < > 14.0* 5.3 9.4  NEUTROABS 3.5  --  3.1  --   --   --  7.7  HGB 15.6   < > 14.6   < > 11.9* 14.0 13.6  HCT 48.5   < > 43.7   < > 37.4* 42.7 42.7  MCV 99.8   < > 96   < > 101.9* 97 97.0  PLT 207   < > 208   < > 205 224 217   < > = values in this interval not displayed.   Lipid Panel: Recent Labs    08/30/20 0828  CHOL 153  HDL 54  LDLCALC 71  TRIG 168*  CHOLHDL 2.8   TSH: Recent Labs    09/03/20 1420  TSH 1.830   A1C: Lab Results  Component Value Date   HGBA1C 6.2 (H) 04/04/2021     Assessment/Plan  1. History of urinary tract infection Urinalysis today has some white cells and will be cultured    3. Arthralgia of knee, unspecified laterality  Likely osteoarthritis but with history of knee giving way, there is concern for meniscus tear  4. Back pain, unspecified back location, unspecified back pain laterality, unspecified chronicity Most of the back pain is musculoskeletal but there is some suspicion of urinary infection  5. Primary hypertension Blood pressure is good at 130/86   Jacalyn Lefevre, MD [facility] [phone]

## 2021-06-19 ENCOUNTER — Other Ambulatory Visit: Payer: Self-pay | Admitting: Medical

## 2021-06-19 NOTE — Telephone Encounter (Signed)
Looks like pt now sees another PCP office

## 2021-06-19 NOTE — Progress Notes (Signed)
Office Visit    Patient Name: [name] Date of Encounter: 06/20/2021  Primary Care Provider:  Wardell Honour, MD Primary Cardiologist:  Peter Martinique, MD  Chief Complaint    66 year old male with a history of CAD s/p CABG, chronic systolic heart failure, ICM, s/p ICD LBBB, hypertension, hyperlipidemia, GERD, arthritis, and anxiety who presents for follow-up related to CAD and heart failure.  Past Medical History    Past Medical History:  Diagnosis Date   AICD (automatic cardioverter/defibrillator) present    Anxiety    Arthritis    "mild; back, neck, spine" (06/09/2017)   CHF (congestive heart failure) (HCC)    Chronic back pain    Coronary artery disease    Dyspnea    GERD (gastroesophageal reflux disease)    History of gout    History of kidney stones X 2   Hypercholesterolemia    Hypertension    LBBB (left bundle branch block)    MI (myocardial infarction) ([place]) 1993   LATERAL   MI (myocardial infarction) ([place]) ?09/2001; 06/07/2017   Pneumonia    "several times" (06/09/2017)   S/P coronary artery stent placement    RCA   Varicose veins    Past Surgical History:  Procedure Laterality Date   ANTERIOR CERVICAL DECOMP/DISCECTOMY FUSION  03/03/2011   Procedure: ANTERIOR CERVICAL DECOMPRESSION/DISCECTOMY FUSION 3 LEVELS;  Surgeon: Floyce Stakes, MD;  Location: [facility]S;  Service: Neurosurgery;  Laterality: N/A;  Cervical three-four Cervical four-five Cervical five-six Anterior cervical decompression/diskectomy, fusion   APPLICATION OF ROBOTIC ASSISTANCE FOR SPINAL PROCEDURE N/A 10/03/2020   Procedure: APPLICATION OF ROBOTIC ASSISTANCE FOR SPINAL PROCEDURE;  Surgeon: Consuella Lose, MD;  Location: [place];  Service: Neurosurgery;  Laterality: N/A;   BACK SURGERY     CARDIAC CATHETERIZATION  2009   Stents in RCA patent. 70 to 80% PL, and 50% ostial PD.    CARDIAC CATHETERIZATION N/A 11/20/2014   Procedure: Left Heart Cath and Coronary Angiography;  Surgeon:  Peter M Martinique, MD;  Location: [place] CV LAB;  Service: Cardiovascular;  Laterality: N/A;   CORONARY ANGIOPLASTY     DIRECT ANGIOPLASTY THE MARGINAL BRANCH   CORONARY ANGIOPLASTY WITH STENT PLACEMENT     RCA   CORONARY ARTERY BYPASS GRAFT N/A 06/14/2017   Procedure: CORONARY ARTERY BYPASS GRAFTING (CABG) x four, using left internal mammary artery and right leg greater saphenous vein harvested endoscopically;  Surgeon: Ivin Poot, MD;  Location: [place];  Service: Open Heart Surgery;  Laterality: N/A;   CYSTOSCOPY N/A 06/14/2017   Procedure: CYSTOSCOPY;  Surgeon: Ivin Poot, MD;  Location: [place];  Service: Open Heart Surgery;  Laterality: N/A;   ICD IMPLANT N/A 03/11/2018   Procedure: ICD IMPLANT - Dual Chamber;  Surgeon: Deboraha Sprang, MD;  Location: [place] CV LAB;  Service: Cardiovascular;  Laterality: N/A;   INSERTION OF SUPRAPUBIC CATHETER N/A 06/14/2017   Procedure: INSERTION OF SUPRAPUBIC CATHETER - Lower abdomen;  Surgeon: Ivin Poot, MD;  Location: [place];  Service: Open Heart Surgery;  Laterality: N/A;   POSTERIOR LUMBAR FUSION  10/2011   RIGHT/LEFT HEART CATH AND CORONARY ANGIOGRAPHY N/A 06/08/2017   Procedure: RIGHT/LEFT HEART CATH AND CORONARY ANGIOGRAPHY;  Surgeon: Martinique, Peter M, MD;  Location: [place] CV LAB;  Service: Cardiovascular;  Laterality: N/A;   RIGHT/LEFT HEART CATH AND CORONARY/GRAFT ANGIOGRAPHY N/A 09/06/2020   Procedure: RIGHT/LEFT HEART CATH AND CORONARY/GRAFT ANGIOGRAPHY;  Surgeon: Martinique, Peter M, MD;  Location:  [place] INVASIVE CV LAB;  Service: Cardiovascular;  Laterality: N/A;   TEE WITHOUT CARDIOVERSION N/A 06/14/2017   Procedure: TRANSESOPHAGEAL ECHOCARDIOGRAM (TEE);  Surgeon: Prescott Gum, Collier Salina, MD;  Location: [place];  Service: Open Heart Surgery;  Laterality: N/A;   URETHROPLASTY N/A 10/15/2017   Procedure: URETHROPLASTY WITH BUCCAL GRAFT HARVAST;  Surgeon: Ardis Hughs, MD;  Location: [facility] ORS;  Service: Urology;  Laterality: N/A;     Allergies  Allergies  Allergen Reactions   Ezetimibe Other (See Comments)    Joint pain and drops BP   Niacin Other (See Comments)    Drops BP and severe   Statins Other (See Comments)    Severe joint pain and drops BP.    History of Present Illness    66 year old male with the above past medical history including CAD s/p CABG, chronic systolic heart failure, ICM, s/p ICD LBBB, hypertension, hyperlipidemia, GERD, arthritis, and anxiety.  He was hospitalized in June 2019 with flash pulmonary edema and ruled in for NSTEMI. Cardiac catheterization at the time showed severe three-vessel CAD.  He underwent CABG x 4 (LIMA-LAD, SVG-diagonal, SVG-ramus intermediate, and SVG-RCA).  He had a syncopal episode in June 2019.  He was noted to have wide-complex tachycardia on telemetry with heart rate in the 170s.  EF was 35 to 40%.  EP was consulted who felt underlying rhythm could be either V. tach or SVT.  He was fitted with a LifeVest.  Pete echocardiogram in September 2019 showed persistently low EF of 25 to 30%.  He was unable to tolerate Entresto, spironolactone, or carvedilol due to orthostatic symptoms and lightheadedness.  He underwent biventricular ICD placement in March 2020 by Dr. Caryl Comes.  Repeat echocardiogram in July 2020 showed EF improved to 40 to 45%.  Lexiscan Myoview in August 2022 in the setting of chest pain and increased dyspnea showed EF decreased to 17%, large inferolateral scar and anterior apical ischemia.  Follow-up echocardiogram showed EF 40%.  Follow-up cardiac catheterization showed occluded SVG to ramus however other grafts were patent.  He was evaluated in April 2023 for increased lower extremity edema. He was started on Lasix and Jardiance.  He was later evaluated in the ED in May 2023 acute shortness of breath and abdominal pain-he was positive for UTI.  Chest x-ray showed mild pulmonary edema with small left pleural effusion.  High sensitivity troponin was borderline. CT of  the chest revealed 4.2 cm dilated ascending thoracic aorta chronic chronic atelectasis in the left lung base, chronic edema chest changes, distended gallbladder, and severe compression deformity of T12. RUQ Ultrasound revealed distended gallbladder, suggestive of cholelithiasis no findings to suggest acute cholecystitis.   He was last seen in the office on 05/23/2021 and reported ongoing bilateral lower extremity edema. He reported stopping several medications including Jardiance, Lasix, and isosorbide dinitrate as he thought these could be contributing to his lower extremity edema.  He was advised to restart Lasix 20 mg daily, over, in the setting of ongoing lower extremity by this was increased to 40 mg daily.  Repeat limited echo showed EF 30 to 35%, slight decrease from prior echocardiogram, LV global hypokinesis, G1 DD, mildly reduced RV systolic function, normal PASP, mild mitral valve regurgitation, dilation of ascending aorta, 41 mm. Given the fact that cath in September 2022 showed no new targets for intervention, medical management was advised.  Titration of HF medications was recommended with consideration of addition of spironolactone.  He presents today for follow-up with a myriad of concerns.  Since his last visit he has felt poorly.  He has been taking his current medications, however, he reports fatigue, lightheadedness, intermittent low heart rate, dyspnea on exertion, and presyncope. He has had multiple episodes in the past 3 months.  He states the symptoms are similar to his symptoms that he had prior to his bypass surgery.  He is concerned that his symptoms represent his anginal equivalent.  He states he is "tired of having medication thrown at him" with no improvement in his symptoms. Additionally, he states that he submitted paperwork for patient assistance for Jardiance and Delene Loll, and has not heard anything back from our office.  He states he is almost out of Entresto and would like to  stop taking this medication.  Lastly, he reports bilateral lower extremity edema, right lower leg pain, and intermittent cramping in his calves with activity.  He states he is frustrated and wants to find out what is causing his symptoms.  Home Medications    Current Outpatient Medications  Medication Sig Dispense Refill   acetaminophen (TYLENOL) 500 MG tablet Take 1,000 mg by mouth every 4 (four) hours as needed. Morning & noon     aspirin 81 MG EC tablet Take 81 mg by mouth in the morning.     carvedilol (COREG) 6.25 MG tablet Take 0.5 tablets (3.125 mg total) by mouth 2 (two) times daily with a meal. Must have office visit for additional refills [id number] 60 tablet 0   dicyclomine (BENTYL) 20 MG tablet Take 1 tablet (20 mg total) by mouth 2 (two) times daily. 20 tablet 0   empagliflozin (JARDIANCE) 10 MG TABS tablet Take 10 mg by mouth daily.     furosemide (LASIX) 40 MG tablet Take 1 tablet (40 mg total) by mouth daily. 90 tablet 3   REPATHA SURECLICK 222 MG/ML SOAJ INJECT INTO SKIN EVERY 14 DAYS 2 mL 11   valsartan (DIOVAN) 80 MG tablet Take 1 tablet (80 mg total) by mouth daily. 90 tablet 3   zolpidem (AMBIEN) 10 MG tablet Take 1 tablet (10 mg total) by mouth at bedtime. 30 tablet 3   Current Facility-Administered Medications  Medication Dose Route Frequency Provider Last Rate Last Admin   sodium chloride flush (NS) 0.9 % injection 3 mL  3 mL Intravenous Q12H Darica Goren C, NP         Review of Systems    He denies chest pain, palpitations, , pnd, orthopnea, n, v, syncope, weight gain, or early satiety. All other systems reviewed and are otherwise negative except as noted above.   Physical Exam    VS:  BP (!) 160/80   Pulse 84   Ht $R'6\' 2"'Yk$  (1.88 m)   Wt 232 lb (105.2 kg)   SpO2 97%   BMI 29.79 kg/m   GEN: Well nourished, well developed, in no acute distress. HEENT: normal. Neck: Supple, no JVD, carotid bruits, or masses. Cardiac: RRR, no murmurs, rubs, or gallops. No  clubbing, cyanosis, edema.  Radials/DP/PT 2+ and equal bilaterally.  Respiratory:  Respirations regular and unlabored, clear to auscultation bilaterally. GI: Soft, nontender, nondistended, BS + x 4. MS: no deformity or atrophy. Skin: warm and dry, no rash. Neuro:  Strength and sensation are intact. Psych: Normal affect.  Accessory Clinical Findings    ECG personally reviewed by me today -no EKG in office today. Lab Results  Component Value Date   WBC 9.4 05/15/2021   HGB 13.6 05/15/2021   HCT 42.7 05/15/2021   MCV  97.0 05/15/2021   PLT 217 05/15/2021   Lab Results  Component Value Date   CREATININE 0.95 05/15/2021   BUN 13 05/15/2021   NA 137 05/15/2021   K 3.7 05/15/2021   CL 99 05/15/2021   CO2 28 05/15/2021   Lab Results  Component Value Date   ALT 42 05/15/2021   AST 39 05/15/2021   ALKPHOS 51 05/15/2021   BILITOT 1.2 05/15/2021   Lab Results  Component Value Date   CHOL 153 08/30/2020   HDL 54 08/30/2020   LDLCALC 71 08/30/2020   TRIG 168 (H) 08/30/2020   CHOLHDL 2.8 08/30/2020    Lab Results  Component Value Date   HGBA1C 6.2 (H) 04/04/2021    Assessment & Plan    1. CAD/bradycardia/presyncope/dyspnea: S/p He underwent CABG x 4 (LIMA-LAD, SVG-diagonal, SVG-ramus intermediate, and SVG-RCA) in 2019.  Cath in 09/2020 showed occluded SVG to ramus however other grafts were patent. He reports a 3 month history of fatigue, lightheadedness, intermittent low heart rate (50 bpm), dyspnea on exertion, and presyncope.  He is concerned that his symptoms are similar to his prior anginal equivalent.  HR stable in office today, 84 bpm.  Recent device interrogation showed normal device function, 1 short run of NSVT. Discussed with Dr. Ellyn Hack, DOD.  Given progressive symptoms, recent decrease in EF, and occluded SVG-ramus on prior cath, will repeat LHC to assess for progression of CAD.  Patient is eager to proceed.  Catheterization scheduled for 07/01/2021 with Dr. Martinique.  We  will check CBC, be met 1 week prior to procedure.  Patient advised to hold Lasix day of procedure.  Continue aspirin, carvedilol, Jardiance, Lasix, hold valsartan as below, and Repatha.  Shared Decision Making/Informed Consent The risks [stroke (1 in 1000), death (1 in 1000), kidney failure [usually temporary] (1 in 500), bleeding (1 in 200), allergic reaction [possibly serious] (1 in 200)], benefits (diagnostic support and management of coronary artery disease) and alternatives of a cardiac cathet he is frustrated erization were discussed in detail with Mr. Spinello and he is willing to proceed.  2. Chronic systolic heart failure: Most recent limited echo showed EF 30 to 35%, slight decrease from prior echocardiogram, LV global hypokinesis, G1 DD, mildly reduced RV systolic function, normal PASP, mild mitral valve regurgitation, dilation of ascending aorta, 41 mm.  Repeat LHC pending as above. He notes bilateral lower extremity edema, otherwise euvolemic on exam.  He states he submitted patient assistance forms for Jardiance and Entresto.  He is awaiting approval.  In the meantime, he states he would like to stop Entresto as he cannot continue to afford this medication.  Per patient request, will stop Entresto and start valsartan 80 mg daily.  Discussed addition of spironolactone, however, patient declines today.  Continue carvedilol, Lasix, Jardiance.  3. Hypertension: BP elevated above goal in office today.  BP was rechecked, but not documented by CMA. Advised him to continue to monitor BP with transition from Entresto to valsartan and report BP consistently >130/80.  If BP remains elevated above goal, consider addition of spironolactone (patient declined today).  Otherwise, continue current antihypertensive regimen.  4. Hyperlipidemia: LDL was 71 and August 2022.  Continue aspirin, Repatha.  5. Lower leg claudication/bilateral lower extremity edema/right lower leg pain: He has recently noticed increased  lower extremity edema, right lower leg pain and intermittent claudication.  Given history of CAD, will check ABIs, lower extremity duplex.  With right lower leg pain, low suspicion for DVT, however, will rule  out with lower extremity venous duplex.  6. Disposition: Follow-up 2 weeks post cath.  Lenna Sciara, NP 06/20/2021, 4:14 PM

## 2021-06-20 ENCOUNTER — Encounter: Payer: Self-pay | Admitting: Nurse Practitioner

## 2021-06-20 ENCOUNTER — Other Ambulatory Visit: Payer: Self-pay | Admitting: Nurse Practitioner

## 2021-06-20 ENCOUNTER — Ambulatory Visit: Payer: PPO | Admitting: Nurse Practitioner

## 2021-06-20 VITALS — BP 160/80 | HR 84 | Ht 74.0 in | Wt 232.0 lb

## 2021-06-20 DIAGNOSIS — R55 Syncope and collapse: Secondary | ICD-10-CM

## 2021-06-20 DIAGNOSIS — R0602 Shortness of breath: Secondary | ICD-10-CM

## 2021-06-20 DIAGNOSIS — R001 Bradycardia, unspecified: Secondary | ICD-10-CM | POA: Diagnosis not present

## 2021-06-20 DIAGNOSIS — M79604 Pain in right leg: Secondary | ICD-10-CM

## 2021-06-20 DIAGNOSIS — M7989 Other specified soft tissue disorders: Secondary | ICD-10-CM

## 2021-06-20 DIAGNOSIS — I10 Essential (primary) hypertension: Secondary | ICD-10-CM | POA: Diagnosis not present

## 2021-06-20 DIAGNOSIS — I739 Peripheral vascular disease, unspecified: Secondary | ICD-10-CM

## 2021-06-20 DIAGNOSIS — M79606 Pain in leg, unspecified: Secondary | ICD-10-CM | POA: Diagnosis not present

## 2021-06-20 DIAGNOSIS — I5022 Chronic systolic (congestive) heart failure: Secondary | ICD-10-CM | POA: Diagnosis not present

## 2021-06-20 DIAGNOSIS — I447 Left bundle-branch block, unspecified: Secondary | ICD-10-CM | POA: Diagnosis not present

## 2021-06-20 DIAGNOSIS — E785 Hyperlipidemia, unspecified: Secondary | ICD-10-CM | POA: Diagnosis not present

## 2021-06-20 DIAGNOSIS — I25118 Atherosclerotic heart disease of native coronary artery with other forms of angina pectoris: Secondary | ICD-10-CM | POA: Diagnosis not present

## 2021-06-20 DIAGNOSIS — Z01818 Encounter for other preprocedural examination: Secondary | ICD-10-CM

## 2021-06-20 LAB — URINE CULTURE
MICRO NUMBER:: 13519928
SPECIMEN QUALITY:: ADEQUATE

## 2021-06-20 MED ORDER — VALSARTAN 80 MG PO TABS: 80.0000 mg | ORAL_TABLET | Freq: Every day | ORAL | 3 refills | Status: DC

## 2021-06-20 MED ORDER — SODIUM CHLORIDE 0.9% FLUSH: 3.0000 mL | Freq: Two times a day (BID) | INTRAVENOUS | Status: DC

## 2021-06-20 NOTE — Patient Instructions (Signed)
Medication Instructions:  START Valsartan 80 mg daily STOP Entresto   *If you need a refill on your cardiac medications before your next appointment, please call your pharmacy*  Lab Work: Your physician recommends that you return for lab work 1 week prior to cath on 06/24/21:  BMP CBC  If you have labs (blood work) drawn today and your tests are completely normal, you will receive your results only by: MyChart Message (if you have MyChart) OR A paper copy in the mail If you have any lab test that is abnormal or we need to change your treatment, we will call you to review the results.  Testing/Procedures: Your physician has requested that you have a cardiac catheterization. Cardiac catheterization is used to diagnose and/or treat various heart conditions. Doctors may recommend this procedure for a number of different reasons. The most common reason is to evaluate chest pain. Chest pain can be a symptom of coronary artery disease (CAD), and cardiac catheterization can show whether plaque is narrowing or blocking your heart's arteries. This procedure is also used to evaluate the valves, as well as measure the blood flow and oxygen levels in different parts of your heart. For further information please visit [email or web address] Please follow instruction sheet, as given.  Your physician has requested that you have a lower or upper extremity venous duplex. This test is an ultrasound of the veins in the legs or arms. It looks at venous blood flow that carries blood from the heart to the legs or arms. Allow one hour for a Lower Venous exam. Allow thirty minutes for an Upper Venous exam. There are no restrictions or special instructions.  Your physician has requested that you have an ankle brachial index (ABI). During this test an ultrasound and blood pressure cuff are used to evaluate the arteries that supply the arms and legs with blood. Allow thirty minutes for this exam. There are no restrictions or  special instructions.  Your physician has requested that you have a lower extremity arterial exercise duplex. During this test, exercise and ultrasound are used to evaluate arterial blood flow in the legs. Allow one hour for this exam. There are no restrictions or special instructions.  Follow-Up: At [facility], you and your health needs are our priority.  As part of our continuing mission to provide you with exceptional heart care, we have created designated Provider Care Teams.  These Care Teams include your primary Cardiologist (physician) and Advanced Practice Providers (APPs -  Physician Assistants and Nurse Practitioners) who all work together to provide you with the care you need, when you need it.  We recommend signing up for the patient portal called "MyChart".  Sign up information is provided on this After Visit Summary.  MyChart is used to connect with patients for Virtual Visits (Telemedicine).  Patients are able to view lab/test results, encounter notes, upcoming appointments, etc.  Non-urgent messages can be sent to your provider as well.   To learn more about what you can do with MyChart, go to [email or web address].    Your next appointment:   2 week(s) post heart cath   The format for your next appointment:   In Person  Provider:   Peter Swaziland, MD  or  APP         Other Instructions CONTINUE to monitor blood pressure and heart rate   [name] CARDIOVASCULAR DIVISION [facility] [address] [place] [address] Dept: [phone] Loc: [phone]  [name]  [name]  06/20/2021  You are scheduled for a Cardiac Catheterization on Tuesday, June 27 with Dr. Peter Swaziland.  1. Please arrive at the Main Entrance A at [facility]: [address] [place], [place] [address] at 8:30 AM (This time is two hours before your procedure to ensure your preparation). Free valet parking service is available.   Special  note: Every effort is made to have your procedure done on time. Please understand that emergencies sometimes delay scheduled procedures.  2. Diet: Do not eat solid foods after midnight.  You may have clear liquids until 5 AM upon the day of the procedure.  3. Labs: You will need to have blood drawn on Tuesday, June 20 at [facility] [address], [place]  Open: 8am - 5pm (Lunch 12:30 - 1:30)   Phone: [phone]. You do not need to be fasting.  4. Medication instructions in preparation for your procedure:   Contrast Allergy: No  Stop taking, Lasix (Furosemide)  Tuesday, June 27,   On the morning of your procedure, take Aspirin and any morning medicines NOT listed above.  You may use sips of water.  5. Plan to go home the same day, you will only stay overnight if medically necessary. 6. You MUST have a responsible adult to drive you home. 7. An adult MUST be with you the first 24 hours after you arrive home. 8. Bring a current list of your medications, and the last time and date medication taken. 9. Bring ID and current insurance cards. 10.Please wear clothes that are easy to get on and off and wear slip-on shoes.  Thank you for allowing [place] to care for you!   -- [facility]  Important Information About Sugar

## 2021-06-23 ENCOUNTER — Encounter (HOSPITAL_COMMUNITY): Payer: Self-pay

## 2021-06-23 ENCOUNTER — Emergency Department (HOSPITAL_COMMUNITY): Payer: PPO

## 2021-06-23 ENCOUNTER — Inpatient Hospital Stay (HOSPITAL_COMMUNITY)
Admission: RE | Admit: 2021-06-23 | Payer: PPO | Source: Ambulatory Visit | Attending: Nurse Practitioner | Admitting: Nurse Practitioner

## 2021-06-23 ENCOUNTER — Inpatient Hospital Stay (HOSPITAL_COMMUNITY)
Admission: EM | Admit: 2021-06-23 | Discharge: 2021-06-25 | DRG: 246 | Disposition: A | Discharge status: Home / Self Care | Payer: PPO | Attending: Cardiovascular Disease | Admitting: Cardiovascular Disease

## 2021-06-23 ENCOUNTER — Other Ambulatory Visit: Payer: Self-pay

## 2021-06-23 DIAGNOSIS — I200 Unstable angina: Secondary | ICD-10-CM | POA: Diagnosis not present

## 2021-06-23 DIAGNOSIS — Z7984 Long term (current) use of oral hypoglycemic drugs: Secondary | ICD-10-CM

## 2021-06-23 DIAGNOSIS — I509 Heart failure, unspecified: Secondary | ICD-10-CM | POA: Diagnosis not present

## 2021-06-23 DIAGNOSIS — Z833 Family history of diabetes mellitus: Secondary | ICD-10-CM | POA: Diagnosis not present

## 2021-06-23 DIAGNOSIS — I25110 Atherosclerotic heart disease of native coronary artery with unstable angina pectoris: Secondary | ICD-10-CM

## 2021-06-23 DIAGNOSIS — J9811 Atelectasis: Secondary | ICD-10-CM | POA: Diagnosis not present

## 2021-06-23 DIAGNOSIS — R450 Nervousness: Secondary | ICD-10-CM | POA: Diagnosis not present

## 2021-06-23 DIAGNOSIS — E7800 Pure hypercholesterolemia, unspecified: Secondary | ICD-10-CM | POA: Diagnosis not present

## 2021-06-23 DIAGNOSIS — Z87891 Personal history of nicotine dependence: Secondary | ICD-10-CM | POA: Diagnosis not present

## 2021-06-23 DIAGNOSIS — R0602 Shortness of breath: Secondary | ICD-10-CM

## 2021-06-23 DIAGNOSIS — I110 Hypertensive heart disease with heart failure: Principal | ICD-10-CM | POA: Diagnosis present

## 2021-06-23 DIAGNOSIS — K828 Other specified diseases of gallbladder: Secondary | ICD-10-CM | POA: Diagnosis present

## 2021-06-23 DIAGNOSIS — R0902 Hypoxemia: Secondary | ICD-10-CM | POA: Diagnosis present

## 2021-06-23 DIAGNOSIS — I255 Ischemic cardiomyopathy: Secondary | ICD-10-CM | POA: Diagnosis present

## 2021-06-23 DIAGNOSIS — J439 Emphysema, unspecified: Secondary | ICD-10-CM | POA: Diagnosis not present

## 2021-06-23 DIAGNOSIS — Z955 Presence of coronary angioplasty implant and graft: Secondary | ICD-10-CM

## 2021-06-23 DIAGNOSIS — I252 Old myocardial infarction: Secondary | ICD-10-CM | POA: Diagnosis not present

## 2021-06-23 DIAGNOSIS — Z803 Family history of malignant neoplasm of breast: Secondary | ICD-10-CM | POA: Diagnosis not present

## 2021-06-23 DIAGNOSIS — R0600 Dyspnea, unspecified: Secondary | ICD-10-CM

## 2021-06-23 DIAGNOSIS — Z823 Family history of stroke: Secondary | ICD-10-CM

## 2021-06-23 DIAGNOSIS — R918 Other nonspecific abnormal finding of lung field: Secondary | ICD-10-CM | POA: Diagnosis not present

## 2021-06-23 DIAGNOSIS — I10 Essential (primary) hypertension: Secondary | ICD-10-CM | POA: Diagnosis not present

## 2021-06-23 DIAGNOSIS — Z8249 Family history of ischemic heart disease and other diseases of the circulatory system: Secondary | ICD-10-CM | POA: Diagnosis not present

## 2021-06-23 DIAGNOSIS — Z87442 Personal history of urinary calculi: Secondary | ICD-10-CM

## 2021-06-23 DIAGNOSIS — M7989 Other specified soft tissue disorders: Secondary | ICD-10-CM

## 2021-06-23 DIAGNOSIS — I447 Left bundle-branch block, unspecified: Secondary | ICD-10-CM | POA: Diagnosis not present

## 2021-06-23 DIAGNOSIS — R0609 Other forms of dyspnea: Secondary | ICD-10-CM | POA: Diagnosis not present

## 2021-06-23 DIAGNOSIS — R609 Edema, unspecified: Secondary | ICD-10-CM | POA: Diagnosis not present

## 2021-06-23 DIAGNOSIS — I25118 Atherosclerotic heart disease of native coronary artery with other forms of angina pectoris: Secondary | ICD-10-CM

## 2021-06-23 DIAGNOSIS — I2510 Atherosclerotic heart disease of native coronary artery without angina pectoris: Secondary | ICD-10-CM | POA: Diagnosis present

## 2021-06-23 DIAGNOSIS — J90 Pleural effusion, not elsewhere classified: Secondary | ICD-10-CM | POA: Diagnosis not present

## 2021-06-23 DIAGNOSIS — I2584 Coronary atherosclerosis due to calcified coronary lesion: Secondary | ICD-10-CM | POA: Diagnosis present

## 2021-06-23 DIAGNOSIS — I77810 Thoracic aortic ectasia: Secondary | ICD-10-CM | POA: Diagnosis present

## 2021-06-23 DIAGNOSIS — I2582 Chronic total occlusion of coronary artery: Secondary | ICD-10-CM | POA: Diagnosis present

## 2021-06-23 DIAGNOSIS — M109 Gout, unspecified: Secondary | ICD-10-CM | POA: Diagnosis not present

## 2021-06-23 DIAGNOSIS — I25710 Atherosclerosis of autologous vein coronary artery bypass graft(s) with unstable angina pectoris: Secondary | ICD-10-CM | POA: Diagnosis not present

## 2021-06-23 DIAGNOSIS — I5023 Acute on chronic systolic (congestive) heart failure: Secondary | ICD-10-CM

## 2021-06-23 DIAGNOSIS — K219 Gastro-esophageal reflux disease without esophagitis: Secondary | ICD-10-CM | POA: Diagnosis not present

## 2021-06-23 DIAGNOSIS — I959 Hypotension, unspecified: Secondary | ICD-10-CM | POA: Diagnosis not present

## 2021-06-23 DIAGNOSIS — Z95810 Presence of automatic (implantable) cardiac defibrillator: Secondary | ICD-10-CM

## 2021-06-23 DIAGNOSIS — Z951 Presence of aortocoronary bypass graft: Secondary | ICD-10-CM

## 2021-06-23 DIAGNOSIS — Z79899 Other long term (current) drug therapy: Secondary | ICD-10-CM | POA: Diagnosis not present

## 2021-06-23 DIAGNOSIS — I5043 Acute on chronic combined systolic (congestive) and diastolic (congestive) heart failure: Secondary | ICD-10-CM | POA: Diagnosis not present

## 2021-06-23 LAB — COMPREHENSIVE METABOLIC PANEL
ALT: 18 U/L (ref 0–44)
AST: 25 U/L (ref 15–41)
Albumin: 3.7 g/dL (ref 3.5–5.0)
Alkaline Phosphatase: 50 U/L (ref 38–126)
Anion gap: 10 (ref 5–15)
BUN: 11 mg/dL (ref 8–23)
CO2: 25 mmol/L (ref 22–32)
Calcium: 9 mg/dL (ref 8.9–10.3)
Chloride: 105 mmol/L (ref 98–111)
Creatinine, Ser: 1.03 mg/dL (ref 0.61–1.24)
GFR, Estimated: >60 mL/min (ref >60)
Glucose, Bld: 106 mg/dL — ABNORMAL HIGH (ref 70–99)
Potassium: 4.1 mmol/L (ref 3.5–5.1)
Sodium: 140 mmol/L (ref 135–145)
Total Bilirubin: 1.5 mg/dL — ABNORMAL HIGH (ref 0.3–1.2)
Total Protein: 6.4 g/dL — ABNORMAL LOW (ref 6.5–8.1)

## 2021-06-23 LAB — TROPONIN I (HIGH SENSITIVITY)
Troponin I (High Sensitivity): 22 ng/L — ABNORMAL HIGH (ref <18)
Troponin I (High Sensitivity): 27 ng/L — ABNORMAL HIGH (ref <18)

## 2021-06-23 LAB — CBC WITH DIFFERENTIAL/PLATELET
Abs Immature Granulocytes: 0.05 10*3/uL (ref 0.00–0.07)
Basophils Absolute: 0 10*3/uL (ref 0.0–0.1)
Basophils Relative: 1 %
Eosinophils Absolute: 0.1 10*3/uL (ref 0.0–0.5)
Eosinophils Relative: 1 %
HCT: 45 % (ref 39.0–52.0)
Hemoglobin: 15 g/dL (ref 13.0–17.0)
Immature Granulocytes: 1 %
Lymphocytes Relative: 11 %
Lymphs Abs: 0.9 10*3/uL (ref 0.7–4.0)
MCH: 31.7 pg (ref 26.0–34.0)
MCHC: 33.3 g/dL (ref 30.0–36.0)
MCV: 95.1 fL (ref 80.0–100.0)
Monocytes Absolute: 0.8 10*3/uL (ref 0.1–1.0)
Monocytes Relative: 9 %
Neutro Abs: 6.5 10*3/uL (ref 1.7–7.7)
Neutrophils Relative %: 77 %
Platelets: 209 10*3/uL (ref 150–400)
RBC: 4.73 MIL/uL (ref 4.22–5.81)
RDW: 14.3 % (ref 11.5–15.5)
WBC: 8.4 10*3/uL (ref 4.0–10.5)
nRBC: 0 % (ref 0.0–0.2)

## 2021-06-23 LAB — D-DIMER, QUANTITATIVE: D-Dimer, Quant: 1.56 ug/mL-FEU — ABNORMAL HIGH (ref 0.00–0.50)

## 2021-06-23 LAB — BRAIN NATRIURETIC PEPTIDE: B Natriuretic Peptide: 338.6 pg/mL — ABNORMAL HIGH (ref 0.0–100.0)

## 2021-06-23 MED ORDER — SODIUM CHLORIDE 0.9 % IV SOLN: 250.0000 mL | INTRAVENOUS | Status: DC | PRN

## 2021-06-23 MED ORDER — SODIUM CHLORIDE 0.9% FLUSH
3.0000 mL | Freq: Two times a day (BID) | INTRAVENOUS | Status: DC
  Administered 2021-06-24 (×2): 3 mL via INTRAVENOUS

## 2021-06-23 MED ORDER — HEPARIN SODIUM (PORCINE) 5000 UNIT/ML IJ SOLN: 5000.0000 [IU] | Freq: Three times a day (TID) | INTRAMUSCULAR | Status: DC

## 2021-06-23 MED ORDER — FUROSEMIDE 10 MG/ML IJ SOLN
40.0000 mg | Freq: Two times a day (BID) | INTRAMUSCULAR | Status: DC
  Administered 2021-06-23 – 2021-06-24 (×2): 40 mg via INTRAVENOUS
  Filled 2021-06-23 (×2): qty 4

## 2021-06-23 MED ORDER — ASPIRIN 81 MG PO TBEC: 81.0000 mg | DELAYED_RELEASE_TABLET | Freq: Every morning | ORAL | Status: DC

## 2021-06-23 MED ORDER — CARVEDILOL 3.125 MG PO TABS
3.1250 mg | ORAL_TABLET | Freq: Two times a day (BID) | ORAL | Status: DC
  Administered 2021-06-24 – 2021-06-25 (×2): 3.125 mg via ORAL
  Filled 2021-06-23 (×2): qty 1

## 2021-06-23 MED ORDER — ONDANSETRON HCL 4 MG/2ML IJ SOLN: 4.0000 mg | Freq: Four times a day (QID) | INTRAMUSCULAR | Status: DC | PRN

## 2021-06-23 MED ORDER — ASPIRIN 81 MG PO CHEW
324.0000 mg | CHEWABLE_TABLET | Freq: Once | ORAL | Status: AC
  Administered 2021-06-23: 243 mg via ORAL
  Filled 2021-06-23: qty 4

## 2021-06-23 MED ORDER — ACETAMINOPHEN 325 MG PO TABS
650.0000 mg | ORAL_TABLET | ORAL | Status: DC | PRN
  Administered 2021-06-23 – 2021-06-24 (×2): 650 mg via ORAL
  Filled 2021-06-23 (×2): qty 2

## 2021-06-23 MED ORDER — IOHEXOL 350 MG/ML SOLN
80.0000 mL | Freq: Once | INTRAVENOUS | Status: AC | PRN
  Administered 2021-06-23: 80 mL via INTRAVENOUS

## 2021-06-23 MED ORDER — ZOLPIDEM TARTRATE 5 MG PO TABS
5.0000 mg | ORAL_TABLET | Freq: Every evening | ORAL | Status: DC | PRN
  Administered 2021-06-23 – 2021-06-24 (×2): 5 mg via ORAL
  Filled 2021-06-23 (×2): qty 1

## 2021-06-23 MED ORDER — NITROGLYCERIN 0.4 MG SL SUBL: 0.4000 mg | SUBLINGUAL_TABLET | SUBLINGUAL | Status: DC | PRN

## 2021-06-23 MED ORDER — IRBESARTAN 75 MG PO TABS
75.0000 mg | ORAL_TABLET | Freq: Every day | ORAL | Status: DC
  Filled 2021-06-23 (×2): qty 1

## 2021-06-23 MED ORDER — SODIUM CHLORIDE 0.9% FLUSH: 3.0000 mL | INTRAVENOUS | Status: DC | PRN

## 2021-06-23 MED ORDER — ASPIRIN 81 MG PO TBEC
81.0000 mg | DELAYED_RELEASE_TABLET | Freq: Every day | ORAL | Status: DC
  Administered 2021-06-24 – 2021-06-25 (×2): 81 mg via ORAL
  Filled 2021-06-23 (×2): qty 1

## 2021-06-23 MED ORDER — EMPAGLIFLOZIN 10 MG PO TABS
10.0000 mg | ORAL_TABLET | Freq: Every day | ORAL | Status: DC
  Administered 2021-06-25: 10 mg via ORAL
  Filled 2021-06-23 (×2): qty 1

## 2021-06-23 NOTE — ED Provider Notes (Signed)
[facility] EMERGENCY DEPARTMENT Provider Note   CSN: [id number] Arrival date & time: 06/23/21  1351     History {Add pertinent medical, surgical, social history, OB history to HPI:1} Chief Complaint  Patient presents with   Shortness of Breath    [name] is a 66 y.o. male.  HPI      65 year old male with a history of coronary artery disease status post CABG, chronic systolic heart failure, ischemic cardiomyopathy, status post ICD, left bundle branch block, hypertension, hyperlipidemia, GERD, arthritis, anxiety who presents with concern for acute shortness of breath.  The last time he had an episode like this he would had flash pulmonary edema, was ruled in for NSTEMI, and was found to have severe three-vessel coronary artery disease on catheterization.  He underwent CABG x4, had a syncopal event in 2019 and was found to have a wide-complex tachycardia, had ICD placement in March 2020.  In April of this year was found to have increased lower extremity edema and was started on Lasix and Jardiance.  He had some mild pulmonary edema in May 2023, seen in the office with cardiology May 19 with ongoing bilateral lower extremity edema.  He had a cardiac catheterization in September 2022 which showed no new targets for intervention and they had advised medical management.  He was seen June 16 for fatigue, lightheadedness, low heart rate, dyspnea on exertion and presyncope he was concerned these were his anginal equivalent and had a cardiac catheterization scheduled for June 27 with Dr. Swaziland. Home Medications Prior to Admission medications   Medication Sig Start Date End Date Taking? Authorizing Provider  acetaminophen (TYLENOL) 500 MG tablet Take 1,000 mg by mouth every 4 (four) hours as needed. Morning & noon    [provider]  aspirin 81 MG EC tablet Take 81 mg by mouth in the morning.    [provider]  carvedilol (COREG) 6.25 MG tablet Take 0.5  tablets (3.125 mg total) by mouth 2 (two) times daily with a meal. Must have office visit for additional refills [phone] 02/05/20   Sw[place]and, Peter M, MD  dicyclomine (BENTYL) 20 MG tablet Take 1 tablet (20 mg total) by mouth 2 (two) times daily. 05/15/21   Curatolo, Adam, DO  empagliflozin (JARDIANCE) 10 MG TABS tablet Take 10 mg by mouth daily. 06/10/21   [provider]  furosemide (LASIX) 40 MG tablet Take 1 tablet (40 mg total) by mouth daily. 06/16/21   [place], Peter M, MD  REPATHA SURECLICK 140 MG/ML SOAJ INJECT INTO SKIN EVERY 14 DAYS 05/12/21   [place], Peter M, MD  valsartan (DIOVAN) 80 MG tablet Take 1 tablet (80 mg total) by mouth daily. 06/20/21   Joylene Grapes, NP  zolpidem (AMBIEN) 10 MG tablet Take 1 tablet (10 mg total) by mouth at bedtime. 03/10/21   Tysinger, Kermit Balo, PA-C      Allergies    Ezetimibe, Niacin, and Statins    Review of Systems   Review of Systems  Physical Exam Updated Vital Signs BP (!) 145/91 (BP Location: Right Arm)   Pulse 88   Temp 97.7 F (36.5 C) (Oral)   Resp (!) 64   Ht 6\' 1"  (1.854 m)   Wt 105 kg   SpO2 92%   BMI 30.54 kg/m  Physical Exam  ED Results / Procedures / Treatments   Labs (all labs ordered are listed, but only abnormal results are displayed) Labs Reviewed  CBC WITH DIFFERENTIAL/PLATELET  COMPREHENSIVE  METABOLIC PANEL  BRAIN NATRIURETIC PEPTIDE  TROPONIN I (HIGH SENSITIVITY)    EKG None  Radiology No results found.  Procedures Procedures  {Document cardiac monitor, telemetry assessment procedure when appropriate:1}  Medications Ordered in ED Medications - No data to display  ED Course/ Medical Decision Making/ A&P                           Medical Decision Making Amount and/or Complexity of Data Reviewed Labs: ordered. Radiology: ordered.   ***  {Document critical care time when appropriate:1} {Document review of labs and clinical decision tools ie heart score, Chads2Vasc2 etc:1}  {Document your  independent review of radiology images, and any outside records:1} {Document your discussion with family members, caretakers, and with consultants:1} {Document social determinants of health affecting pt's care:1} {Document your decision making why or why not admission, treatments were needed:1} Final Clinical Impression(s) / ED Diagnoses Final diagnoses:  None    Rx / DC Orders ED Discharge Orders     None

## 2021-06-23 NOTE — ED Notes (Signed)
Pt reports hx of 5 Mi's in the past and CABG. Scheduled for heart cath on June 30. Reports sob started ago. O2 in the 80s per EMS. Bipap placed by EMS. 2 nitro SL given en route. Pt was hypertensive with 2016 systolic. Also reports mild nausea, 4mg  Zofran given. EKG shows SR, paced with PVCs. Alert and oriented x 4. Denies chest pain, or any other discomfort at this time. MD at bedside. Cardiac monitoring initiated. O2 at 4LPM [place] initiated at this time with O2 at 91-93% on the monitor.

## 2021-06-23 NOTE — ED Notes (Signed)
Dinner tray ordered.

## 2021-06-23 NOTE — Progress Notes (Signed)
Lower extremity venous bilateral study completed.  Preliminary results relayed to Jodi Mourning, MD.  See CV Proc for preliminary results report.   Jean Rosenthal, RDMS, RVT

## 2021-06-23 NOTE — H&P (Addendum)
Cardiology Admission History and Physical:   Patient ID: [name] MRN: [id number]; DOB: 1955/05/08   Admission date: 06/23/2021  PCP:  Frederica Kuster, MD   [facility] Providers Cardiologist:  Peter Swaziland, MD  Electrophysiologist:  Sherryl Manges, MD       Chief Complaint:  Shortness of breath  History of Present Illness:   [name] is a 66 yo male with history of CAD s/p 4V CABG, former tobacco abuse, chronic systolic CHF, ICM s/p ICD, HTN, HLD, GERD who is presenting to the ED with c/o dyspnea. He was hospitalized in June 2019 with flash pulmonary edema and ruled in for NSTEMI. Cardiac catheterization at the time showed severe three-vessel CAD.  He underwent CABG x 4 (LIMA-LAD, SVG-diagonal, SVG-ramus intermediate, and SVG-RCA).  He had a syncopal episode in June 2019.  He was noted to have wide-complex tachycardia on telemetry with heart rate in the 170s.  EF was 35 to 40%.  EP was consulted who felt underlying rhythm could be either V. tach or SVT.  Echocardiogram in September 2019 showed persistently low EF of 25 to 30%.  He was unable to tolerate Entresto, spironolactone, or carvedilol due to orthostatic symptoms and lightheadedness.  He underwent biventricular ICD placement in March 2020 by Dr. Graciela Husbands.  Repeat echocardiogram in July 2020 showed EF improved to 40 to 45%.  Lexiscan Myoview in August 2022 in the setting of chest pain and increased dyspnea showed EF decreased to 17%, large inferolateral scar and anterior apical ischemia.  Follow-up echocardiogram showed EF 40%. Cardiac catheterization in September 2022 showed occluded SVG to ramus however other grafts were patent.  He was evaluated in April 2023 for increased lower extremity edema. He was started on Lasix and Jardiance.  He was later evaluated in the ED in May 2023 acute shortness of breath and abdominal pain-he was positive for UTI.  Chest x-ray showed mild pulmonary edema with small left pleural effusion.  High  sensitivity troponin was borderline. CT of the chest revealed 4.2 cm dilated ascending thoracic aorta chronic chronic atelectasis in the left lung base, chronic edema chest changes, distended gallbladder, and severe compression deformity of T12. He was seen in our office May 2023 with c/o bilateral LE edema after stopping Lasix. Echo in may 2023 with LVEF=30-35%. He was seen in our office 06/20/21 and had multiple complaints including fatigue, dizziness and LE edema. Cardiac cath was arranged for 07/04/21 with Dr. Swaziland.   He presented to the ED today with c/o dyspnea. The dyspnea started today. Initial O2 sats in the 80s per EMS notes. He denies chest pain. BNP 338. Troponin 22. D-dimer 1.56. Chest x-ray with mild pulmonary edema. Chest CTA pending to exclude PE. LE u/s with no evidence of DVT. Comfortable at rest.    Past Medical History:  Diagnosis Date   AICD (automatic cardioverter/defibrillator) present    Anxiety    Arthritis    "mild; back, neck, spine" (06/09/2017)   CHF (congestive heart failure) (HCC)    Chronic back pain    Coronary artery disease    Dyspnea    GERD (gastroesophageal reflux disease)    History of gout    History of kidney stones X 2   Hypercholesterolemia    Hypertension    LBBB (left bundle branch block)    MI (myocardial infarction) (HCC) 1993   LATERAL   MI (myocardial infarction) (HCC) ?09/2001; 06/07/2017   Pneumonia    "several times" (06/09/2017)   S/P coronary  artery stent placement    RCA   Varicose veins     Past Surgical History:  Procedure Laterality Date   ANTERIOR CERVICAL DECOMP/DISCECTOMY FUSION  03/03/2011   Procedure: ANTERIOR CERVICAL DECOMPRESSION/DISCECTOMY FUSION 3 LEVELS;  Surgeon: Karn Cassis, MD;  Location: [facility]URO ORS;  Service: Neurosurgery;  Laterality: N/A;  Cervical three-four Cervical four-five Cervical five-six Anterior cervical decompression/diskectomy, fusion   APPLICATION OF ROBOTIC ASSISTANCE FOR SPINAL PROCEDURE N/A  10/03/2020   Procedure: APPLICATION OF ROBOTIC ASSISTANCE FOR SPINAL PROCEDURE;  Surgeon: Lisbeth Renshaw, MD;  Location: [facility] OR;  Service: Neurosurgery;  Laterality: N/A;   BACK SURGERY     CARDIAC CATHETERIZATION  2009   Stents in RCA patent. 70 to 80% PL, and 50% ostial PD.    CARDIAC CATHETERIZATION N/A 11/20/2014   Procedure: Left Heart Cath and Coronary Angiography;  Surgeon: Peter M Swaziland, MD;  Location: [facility] INVASIVE CV LAB;  Service: Cardiovascular;  Laterality: N/A;   CORONARY ANGIOPLASTY     DIRECT ANGIOPLASTY THE MARGINAL BRANCH   CORONARY ANGIOPLASTY WITH STENT PLACEMENT     RCA   CORONARY ARTERY BYPASS GRAFT N/A 06/14/2017   Procedure: CORONARY ARTERY BYPASS GRAFTING (CABG) x four, using left internal mammary artery and right leg greater saphenous vein harvested endoscopically;  Surgeon: Kerin Perna, MD;  Location: [facility] OR;  Service: Open Heart Surgery;  Laterality: N/A;   CYSTOSCOPY N/A 06/14/2017   Procedure: CYSTOSCOPY;  Surgeon: Kerin Perna, MD;  Location: [facility] OR;  Service: Open Heart Surgery;  Laterality: N/A;   ICD IMPLANT N/A 03/11/2018   Procedure: ICD IMPLANT - Dual Chamber;  Surgeon: [name], MD;  Location: [facility] INVASIVE CV LAB;  Service: Cardiovascular;  Laterality: N/A;   INSERTION OF SUPRAPUBIC CATHETER N/A 06/14/2017   Procedure: INSERTION OF SUPRAPUBIC CATHETER - Lower abdomen;  Surgeon: Kerin Perna, MD;  Location: [facility] OR;  Service: Open Heart Surgery;  Laterality: N/A;   POSTERIOR LUMBAR FUSION  10/2011   RIGHT/LEFT HEART CATH AND CORONARY ANGIOGRAPHY N/A 06/08/2017   Procedure: RIGHT/LEFT HEART CATH AND CORONARY ANGIOGRAPHY;  Surgeon: Swaziland, Peter M, MD;  Location: [facility] INVASIVE CV LAB;  Service: Cardiovascular;  Laterality: N/A;   RIGHT/LEFT HEART CATH AND CORONARY/GRAFT ANGIOGRAPHY N/A 09/06/2020   Procedure: RIGHT/LEFT HEART CATH AND CORONARY/GRAFT ANGIOGRAPHY;  Surgeon: Swaziland, Peter M, MD;  Location: [facility] INVASIVE CV LAB;  Service: Cardiovascular;   Laterality: N/A;   TEE WITHOUT CARDIOVERSION N/A 06/14/2017   Procedure: TRANSESOPHAGEAL ECHOCARDIOGRAM (TEE);  Surgeon: Donata Clay, Theron Arista, MD;  Location: [facility] OR;  Service: Open Heart Surgery;  Laterality: N/A;   URETHROPLASTY N/A 10/15/2017   Procedure: URETHROPLASTY WITH BUCCAL GRAFT HARVAST;  Surgeon: Crist Fat, MD;  Location: [facility] ORS;  Service: Urology;  Laterality: N/A;     Medications Prior to Admission: Prior to Admission medications   Medication Sig Start Date End Date Taking? Authorizing Provider  acetaminophen (TYLENOL) 500 MG tablet Take 1,000 mg by mouth every 4 (four) hours as needed. Morning & noon    [provider]  aspirin 81 MG EC tablet Take 81 mg by mouth in the morning.    [provider]  carvedilol (COREG) 6.25 MG tablet Take 0.5 tablets (3.125 mg total) by mouth 2 (two) times daily with a meal. Must have office visit for additional refills [phone] 02/05/20   Swaziland, Peter M, MD  dicyclomine (BENTYL) 20 MG tablet Take 1 tablet (20 mg total) by mouth 2 (two) times daily. 05/15/21  Curatolo, Adam, DO  empagliflozin (JARDIANCE) 10 MG TABS tablet Take 10 mg by mouth daily. 06/10/21   [provider]  furosemide (LASIX) 40 MG tablet Take 1 tablet (40 mg total) by mouth daily. 06/16/21   Swaziland, Peter M, MD  REPATHA SURECLICK 140 MG/ML SOAJ INJECT INTO SKIN EVERY 14 DAYS 05/12/21   Swaziland, Peter M, MD  valsartan (DIOVAN) 80 MG tablet Take 1 tablet (80 mg total) by mouth daily. 06/20/21   Joylene Grapes, NP  zolpidem (AMBIEN) 10 MG tablet Take 1 tablet (10 mg total) by mouth at bedtime. 03/10/21   Tysinger, Kermit Balo, PA-C     Allergies:    Allergies  Allergen Reactions   Ezetimibe Other (See Comments)    Joint pain and drops BP   Niacin Other (See Comments)    Drops BP and severe   Statins Other (See Comments)    Severe joint pain and drops BP.    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  1987      Do you live in a house, apartment, assisted living, condo, trailer, etc.?  House      Is it one or more stories?  2 stories      How many persons live in your home?  2         Do you have any pets in your home?(please list)  No      Highest level of education completed:  11 Years      Current or past profession:  Sales-past      Do you exercise?:  YES    Type and how often:  Cardio and weights/At least everyday      Do you have a Living Will? (Form that indicates scenarios where you would not want your life prolonged)  YES      Do you have a DNR form?  YES       If not, would you like to discuss one?      Do you have signed POA/HPOA forms?  NO      Do you have difficulty bathing or dressing yourself?  NO      Do you have difficulty preparing food or eating?  NO      Do you have difficulty managing medications?  NO      Do you have difficulty managing your finances?  NO      Do you have difficulty affording your medications?  NO  Social Determinants of Health   Financial Resource Strain: Low Risk  (01/09/2021)   Overall Financial Resource Strain (CARDIA)    Difficulty of Paying Living Expenses: Not very  hard  Food Insecurity: Not on file  Transportation Needs: No Transportation Needs (01/09/2021)   PRAPARE - Administrator, Civil Service (Medical): No    Lack of Transportation (Non-Medical): No  Physical Activity: Not on file  Stress: Not on file  Social Connections: Not on file  Intimate Partner Violence: Not on file    Family History:   The patient's family history includes Breast cancer in his mother; Coronary artery disease in his mother; Diabetes in his father; Heart attack in his brother, father, and maternal grandfather; Heart disease in his brother; Stroke in his father.    ROS:  Please see the history of present illness.  All other ROS reviewed and negative.     Physical Exam/Data:   Vitals:   06/23/21 1357 06/23/21 1430 06/23/21 1500 06/23/21 1530  BP:  129/71 (!) 103/51 124/69  Pulse:  89 82 78  Resp:  13 15 15   Temp:      TempSrc:      SpO2:  95% 95% 93%  Weight: 105 kg     Height: 6\' 1"  (1.854 m)      No intake or output data in the 24 hours ending 06/23/21 1601    06/23/2021    1:57 PM 06/20/2021   10:33 AM 06/17/2021    9:01 AM  Last 3 Weights  Weight (lbs) 231 lb 7.7 oz 232 lb 235 lb 3.2 oz  Weight (kg) 105 kg 105.235 kg 106.686 kg     Body mass index is 30.54 kg/m.  General:  Well nourished, well developed, in no acute distress HEENT: normal Neck: no JVD Vascular: No carotid bruits; Distal pulses 2+ bilaterally   Cardiac:  normal S1, S2; RRR; no murmur  Lungs:  clear to auscultation bilaterally, no wheezing, rhonchi or rales  Abd: soft, nontender, no hepatomegaly  Ext: Trace to 1+ bilateral LE edema Musculoskeletal:  No deformities, BUE and BLE strength normal and equal Skin: warm and dry  Neuro:  CNs 2-12 intact, no focal abnormalities noted Psych:  Normal affect    EKG:  The ECG that was done was personally reviewed and demonstrates paced rhythm, PVCs  Relevant CV Studies:   Laboratory Data:  High Sensitivity Troponin:    Recent Labs  Lab 06/23/21 1408  TROPONINIHS 22*      Chemistry Recent Labs  Lab 06/23/21 1408  NA 140  K 4.1  CL 105  CO2 25  GLUCOSE 106*  BUN 11  CREATININE 1.03  CALCIUM 9.0  GFRNONAA >60  ANIONGAP 10    Recent Labs  Lab 06/23/21 1408  PROT 6.4*  ALBUMIN 3.7  AST 25  ALT 18  ALKPHOS 50  BILITOT 1.5*   Lipids No results for input(s): "CHOL", "TRIG", "HDL", "LABVLDL", "LDLCALC", "CHOLHDL" in the last 168 hours. Hematology Recent Labs  Lab 06/23/21 1408  WBC 8.4  RBC 4.73  HGB 15.0  HCT 45.0  MCV 95.1  MCH 31.7  MCHC 33.3  RDW 14.3  PLT 209   Thyroid No results for input(s): "TSH", "FREET4" in the last 168 hours. BNP Recent Labs  Lab 06/23/21 1408  BNP 338.6*    DDimer  Recent Labs  Lab 06/23/21 1408  DDIMER 1.56*     Radiology/Studies:  VAS 06/25/21 LOWER EXTREMITY VENOUS (DVT) (ONLY [facility] &  [facility])  Result Date: 06/23/2021  Lower Venous DVT Study Patient Name:  ERRIC MACHNIK  Date of Exam:   06/23/2021 Medical Rec #: [id number]      Accession #:    [id number] Date of Birth: Aug 13, 1955      Patient Gender: M Patient Age:   30 years Exam Location:  [facility] Procedure:      VAS [place] LOWER EXTREMITY VENOUS (DVT) Referring Phys: Alvira Monday --------------------------------------------------------------------------------  Indications: Swelling, right>left, short of breath.  Comparison Study: No prior studies. Performing Technologist: Jean Rosenthal RDMS, RVT  Examination Guidelines: A complete evaluation includes B-mode imaging, spectral Doppler, color Doppler, and power Doppler as needed of all accessible portions of each vessel. Bilateral testing is considered an integral part of a complete examination. Limited examinations for reoccurring indications may be performed as noted. The reflux portion of the exam is performed with the patient in reverse Trendelenburg.  +---------+---------------+---------+-----------+---------------+--------------+ RIGHT     CompressibilityPhasicitySpontaneityProperties     Thrombus Aging +---------+---------------+---------+-----------+---------------+--------------+ CFV      Full           Yes      Yes                                      +---------+---------------+---------+-----------+---------------+--------------+ SFJ      Full                                                             +---------+---------------+---------+-----------+---------------+--------------+ FV Prox  Full                                                             +---------+---------------+---------+-----------+---------------+--------------+ FV Mid   Full                                                             +---------+---------------+---------+-----------+---------------+--------------+ FV DistalFull                                                             +---------+---------------+---------+-----------+---------------+--------------+ PFV      Full                                                             +---------+---------------+---------+-----------+---------------+--------------+ POP      Full           Yes      Yes                                      +---------+---------------+---------+-----------+---------------+--------------+  PTV      Full                                                             +---------+---------------+---------+-----------+---------------+--------------+ PERO     Full                                                             +---------+---------------+---------+-----------+---------------+--------------+ Gastroc  Full           Yes      Yes        Prolonged flow                                                            reversal noted                                                            in the GSV at                                                             patient's area                                                             of concern                    +---------+---------------+---------+-----------+---------------+--------------+   +---------+---------------+---------+-----------+----------+--------------+ LEFT     CompressibilityPhasicitySpontaneityPropertiesThrombus Aging +---------+---------------+---------+-----------+----------+--------------+ CFV      Full           Yes      Yes                                 +---------+---------------+---------+-----------+----------+--------------+ SFJ      Full                                                        +---------+---------------+---------+-----------+----------+--------------+ FV Prox  Full                                                        +---------+---------------+---------+-----------+----------+--------------+  FV Mid   Full                                                        +---------+---------------+---------+-----------+----------+--------------+ FV DistalFull                                                        +---------+---------------+---------+-----------+----------+--------------+ PFV      Full                                                        +---------+---------------+---------+-----------+----------+--------------+ POP      Full           Yes      Yes                                 +---------+---------------+---------+-----------+----------+--------------+ PTV      Full                                                        +---------+---------------+---------+-----------+----------+--------------+ PERO     Full                                                        +---------+---------------+---------+-----------+----------+--------------+ Gastroc  Full                                                        +---------+---------------+---------+-----------+----------+--------------+    Summary: RIGHT: - There is no evidence of deep  vein thrombosis in the lower extremity.  - No cystic structure found in the popliteal fossa.  - Incidental: Prolonged flow reversal noted in the distal greater saphenous vein at patient's area of concern/palpation.  LEFT: - There is no evidence of deep vein thrombosis in the lower extremity.  - No cystic structure found in the popliteal fossa.  *See table(s) above for measurements and observations.    Preliminary    DG Chest Portable 1 View  Result Date: 06/23/2021 CLINICAL DATA:  Shortness of breath EXAM: PORTABLE CHEST 1 VIEW COMPARISON:  May 15, 2021 FINDINGS: The heart size and mediastinal contours are stable. The heart size is enlarged. Cardiac pacemaker and pacer leads are unchanged. Mild increased pulmonary interstitium is identified bilaterally. Small left pleural effusion is identified. Patchy opacity of the medial left lung base is noted. The visualized skeletal structures are stable. IMPRESSION: 1. Mild congestive heart failure. 2. Small left pleural effusion. 3. Patchy opacity of  the medial left lung base, superimposed pneumonia is not excluded. Electronically Signed   By: Sherian Rein M.D.   On: 06/23/2021 14:29     Assessment and Plan:   Ischemic cardiomyopathy/Acute on chronic systolic CHF: He has evidence of mild volume overload on exam. Mild elevation BNP. Chest x-ray with pulmonary edema. I will give one dose of IV Lasix today and then start 40 mg IV BID. ICD in place. Echo last week with LVEF=30-35%. Continue Jardiance and ARB. Consider spironolactone before discharge but he has refused in the past.   CAD s/p CABG with unstable angina: Fatigue and dyspnea which are his previous anginal equivalent prior to bypass. Minimal elevation of troponin. No chest pain. Cardiac cath was planned for next week. Will diurese tonight and place on the cath board for tomorrow for right and left heart cath. NPO at midnight. I have reviewed the risks, indications, and alternatives to cardiac  catheterization, possible angioplasty, and stenting with the patient. Risks include but are not limited to bleeding, infection, vascular injury, stroke, myocardial infection, arrhythmia, kidney injury, radiation-related injury in the case of prolonged fluoroscopy use, emergency cardiac surgery, and death. The patient understands the risks of serious complication is 1-2 in 1000 with diagnostic cardiac cath and 1-2% or less with angioplasty/stenting. He is statin intolerant and on Repatha as an outpatient. Continue ASA and beta blocker.  HTN: BP controlled.  HLD: Continue statin.   Severity of Illness: The appropriate patient status for this patient is INPATIENT. Inpatient status is judged to be reasonable and necessary in order to provide the required intensity of service to ensure the patient's safety. The patient's presenting symptoms, physical exam findings, and initial radiographic and laboratory data in the context of their chronic comorbidities is felt to place them at high risk for further clinical deterioration. Furthermore, it is not anticipated that the patient will be medically stable for discharge from the hospital within 2 midnights of admission.   * I certify that at the point of admission it is my clinical judgment that the patient will require inpatient hospital care spanning beyond 2 midnights from the point of admission due to high intensity of service, high risk for further deterioration and high frequency of surveillance required.*   For questions or updates, please contact [facility] Please consult [email or web address] for contact info under     Signed, Verne Carrow, MD  06/23/2021 4:01 PM

## 2021-06-23 NOTE — ED Triage Notes (Signed)
Acute onset of SOB from home with O2 in the 80s at RA. Not on home O2. Hx of afib. Hypertensive with 216 systolic. 2 nitro SL given by EMS. Alert and oriented x 4.

## 2021-06-24 ENCOUNTER — Inpatient Hospital Stay (HOSPITAL_COMMUNITY)
Admission: EM | Disposition: A | Discharge status: Home / Self Care | Payer: Self-pay | Source: Home / Self Care | Attending: Cardiovascular Disease

## 2021-06-24 ENCOUNTER — Other Ambulatory Visit: Payer: Self-pay

## 2021-06-24 ENCOUNTER — Encounter (HOSPITAL_COMMUNITY): Payer: PPO

## 2021-06-24 DIAGNOSIS — I5043 Acute on chronic combined systolic (congestive) and diastolic (congestive) heart failure: Secondary | ICD-10-CM

## 2021-06-24 DIAGNOSIS — I25110 Atherosclerotic heart disease of native coronary artery with unstable angina pectoris: Secondary | ICD-10-CM

## 2021-06-24 HISTORY — PX: CORONARY STENT INTERVENTION: CATH118234

## 2021-06-24 HISTORY — PX: RIGHT/LEFT HEART CATH AND CORONARY/GRAFT ANGIOGRAPHY: CATH118267

## 2021-06-24 HISTORY — PX: INTRAVASCULAR PRESSURE WIRE/FFR STUDY: CATH118243

## 2021-06-24 LAB — POCT I-STAT EG7
Acid-Base Excess: 6 mmol/L — ABNORMAL HIGH (ref 0.0–2.0)
Acid-Base Excess: 6 mmol/L — ABNORMAL HIGH (ref 0.0–2.0)
Bicarbonate: 30.6 mmol/L — ABNORMAL HIGH (ref 20.0–28.0)
Bicarbonate: 31 mmol/L — ABNORMAL HIGH (ref 20.0–28.0)
Calcium, Ion: 1.04 mmol/L — ABNORMAL LOW (ref 1.15–1.40)
Calcium, Ion: 1.19 mmol/L (ref 1.15–1.40)
HCT: 46 % (ref 39.0–52.0)
HCT: 46 % (ref 39.0–52.0)
Hemoglobin: 15.6 g/dL (ref 13.0–17.0)
Hemoglobin: 15.6 g/dL (ref 13.0–17.0)
O2 Saturation: 68 %
O2 Saturation: 70 %
Potassium: 4.1 mmol/L (ref 3.5–5.1)
Potassium: 4.1 mmol/L (ref 3.5–5.1)
Sodium: 139 mmol/L (ref 135–145)
Sodium: 140 mmol/L (ref 135–145)
TCO2: 32 mmol/L (ref 22–32)
TCO2: 32 mmol/L (ref 22–32)
pCO2, Ven: 44 mmHg (ref 44–60)
pCO2, Ven: 44.6 mmHg (ref 44–60)
pH, Ven: 7.449 — ABNORMAL HIGH (ref 7.25–7.43)
pH, Ven: 7.451 — ABNORMAL HIGH (ref 7.25–7.43)
pO2, Ven: 34 mmHg (ref 32–45)
pO2, Ven: 35 mmHg (ref 32–45)

## 2021-06-24 LAB — CBC
HCT: 44.7 % (ref 39.0–52.0)
Hemoglobin: 14.5 g/dL (ref 13.0–17.0)
MCH: 31.2 pg (ref 26.0–34.0)
MCHC: 32.4 g/dL (ref 30.0–36.0)
MCV: 96.1 fL (ref 80.0–100.0)
Platelets: 200 10*3/uL (ref 150–400)
RBC: 4.65 MIL/uL (ref 4.22–5.81)
RDW: 14.5 % (ref 11.5–15.5)
WBC: 7.2 10*3/uL (ref 4.0–10.5)
nRBC: 0 % (ref 0.0–0.2)

## 2021-06-24 LAB — BASIC METABOLIC PANEL
Anion gap: 8 (ref 5–15)
BUN: 12 mg/dL (ref 8–23)
CO2: 32 mmol/L (ref 22–32)
Calcium: 9.1 mg/dL (ref 8.9–10.3)
Chloride: 100 mmol/L (ref 98–111)
Creatinine, Ser: 1.22 mg/dL (ref 0.61–1.24)
GFR, Estimated: >60 mL/min (ref >60)
Glucose, Bld: 93 mg/dL (ref 70–99)
Potassium: 4.2 mmol/L (ref 3.5–5.1)
Sodium: 140 mmol/L (ref 135–145)

## 2021-06-24 LAB — POCT I-STAT 7, (LYTES, BLD GAS, ICA,H+H)
Acid-Base Excess: 5 mmol/L — ABNORMAL HIGH (ref 0.0–2.0)
Bicarbonate: 28 mmol/L (ref 20.0–28.0)
Calcium, Ion: 1.11 mmol/L — ABNORMAL LOW (ref 1.15–1.40)
HCT: 44 % (ref 39.0–52.0)
Hemoglobin: 15 g/dL (ref 13.0–17.0)
O2 Saturation: 98 %
Potassium: 4 mmol/L (ref 3.5–5.1)
Sodium: 140 mmol/L (ref 135–145)
TCO2: 29 mmol/L (ref 22–32)
pCO2 arterial: 35.8 mmHg (ref 32–48)
pH, Arterial: 7.502 — ABNORMAL HIGH (ref 7.35–7.45)
pO2, Arterial: 88 mmHg (ref 83–108)

## 2021-06-24 LAB — POCT ACTIVATED CLOTTING TIME
Activated Clotting Time: 263 seconds
Activated Clotting Time: 227 seconds

## 2021-06-24 SURGERY — RIGHT/LEFT HEART CATH AND CORONARY/GRAFT ANGIOGRAPHY
Anesthesia: LOCAL

## 2021-06-24 MED ORDER — SODIUM CHLORIDE 0.9 % IV SOLN: 250.0000 mL | INTRAVENOUS | Status: DC | PRN

## 2021-06-24 MED ORDER — LIDOCAINE HCL (PF) 1 % IJ SOLN
INTRAMUSCULAR | Status: DC | PRN
  Administered 2021-06-24: 12 mL

## 2021-06-24 MED ORDER — SODIUM CHLORIDE 0.9% FLUSH: 3.0000 mL | INTRAVENOUS | Status: DC | PRN

## 2021-06-24 MED ORDER — IOHEXOL 350 MG/ML SOLN
INTRAVENOUS | Status: DC | PRN
  Administered 2021-06-24: 165 mL

## 2021-06-24 MED ORDER — LABETALOL HCL 5 MG/ML IV SOLN: 10.0000 mg | INTRAVENOUS | Status: AC | PRN

## 2021-06-24 MED ORDER — ONDANSETRON HCL 4 MG/2ML IJ SOLN
INTRAMUSCULAR | Status: AC
  Filled 2021-06-24: qty 2

## 2021-06-24 MED ORDER — SODIUM CHLORIDE 0.9 % IV SOLN: INTRAVENOUS | Status: DC

## 2021-06-24 MED ORDER — ASPIRIN 81 MG PO CHEW: 81.0000 mg | CHEWABLE_TABLET | ORAL | Status: DC

## 2021-06-24 MED ORDER — NITROGLYCERIN 1 MG/10 ML FOR IR/CATH LAB
INTRA_ARTERIAL | Status: DC | PRN
  Administered 2021-06-24: 200 ug

## 2021-06-24 MED ORDER — HEPARIN SODIUM (PORCINE) 1000 UNIT/ML IJ SOLN
INTRAMUSCULAR | Status: AC
  Filled 2021-06-24: qty 10

## 2021-06-24 MED ORDER — MIDAZOLAM HCL 2 MG/2ML IJ SOLN
INTRAMUSCULAR | Status: AC
  Filled 2021-06-24: qty 2

## 2021-06-24 MED ORDER — MIDAZOLAM HCL 2 MG/2ML IJ SOLN
INTRAMUSCULAR | Status: DC | PRN
  Administered 2021-06-24: 1 mg via INTRAVENOUS

## 2021-06-24 MED ORDER — CLOPIDOGREL BISULFATE 75 MG PO TABS
75.0000 mg | ORAL_TABLET | Freq: Every day | ORAL | Status: DC
  Administered 2021-06-25: 75 mg via ORAL
  Filled 2021-06-24: qty 1

## 2021-06-24 MED ORDER — LIDOCAINE HCL (PF) 1 % IJ SOLN
INTRAMUSCULAR | Status: AC
  Filled 2021-06-24: qty 30

## 2021-06-24 MED ORDER — HEPARIN (PORCINE) IN NACL 1000-0.9 UT/500ML-% IV SOLN
INTRAVENOUS | Status: AC
  Filled 2021-06-24: qty 500

## 2021-06-24 MED ORDER — HYDRALAZINE HCL 20 MG/ML IJ SOLN: 10.0000 mg | INTRAMUSCULAR | Status: AC | PRN

## 2021-06-24 MED ORDER — CLOPIDOGREL BISULFATE 75 MG PO TABS
ORAL_TABLET | ORAL | Status: AC
  Filled 2021-06-24: qty 8

## 2021-06-24 MED ORDER — ADENOSINE 12 MG/4ML IV SOLN
INTRAVENOUS | Status: AC
  Filled 2021-06-24: qty 4

## 2021-06-24 MED ORDER — FENTANYL CITRATE (PF) 100 MCG/2ML IJ SOLN
INTRAMUSCULAR | Status: AC
  Filled 2021-06-24: qty 2

## 2021-06-24 MED ORDER — HEPARIN SODIUM (PORCINE) 1000 UNIT/ML IJ SOLN
INTRAMUSCULAR | Status: DC | PRN
  Administered 2021-06-24: 9000 [IU] via INTRAVENOUS
  Administered 2021-06-24: 2000 [IU] via INTRAVENOUS

## 2021-06-24 MED ORDER — ADENOSINE (DIAGNOSTIC) 140MCG/KG/MIN
INTRAVENOUS | Status: AC | PRN
  Administered 2021-06-24: 140 ug/kg/min via INTRAVENOUS

## 2021-06-24 MED ORDER — HEPARIN SODIUM (PORCINE) 5000 UNIT/ML IJ SOLN
5000.0000 [IU] | Freq: Three times a day (TID) | INTRAMUSCULAR | Status: DC
  Administered 2021-06-24: 5000 [IU] via SUBCUTANEOUS
  Filled 2021-06-24: qty 1

## 2021-06-24 MED ORDER — SODIUM CHLORIDE 0.9% FLUSH
3.0000 mL | Freq: Two times a day (BID) | INTRAVENOUS | Status: DC
  Administered 2021-06-24: 3 mL via INTRAVENOUS

## 2021-06-24 MED ORDER — HEPARIN (PORCINE) IN NACL 1000-0.9 UT/500ML-% IV SOLN
INTRAVENOUS | Status: DC | PRN
  Administered 2021-06-24 (×3): 500 mL

## 2021-06-24 MED ORDER — NITROGLYCERIN 1 MG/10 ML FOR IR/CATH LAB
INTRA_ARTERIAL | Status: AC
  Filled 2021-06-24: qty 10

## 2021-06-24 MED ORDER — CLOPIDOGREL BISULFATE 300 MG PO TABS
ORAL_TABLET | ORAL | Status: DC | PRN
  Administered 2021-06-24: 600 mg via ORAL

## 2021-06-24 MED ORDER — FAMOTIDINE IN NACL 20-0.9 MG/50ML-% IV SOLN
INTRAVENOUS | Status: AC
  Filled 2021-06-24: qty 50

## 2021-06-24 MED ORDER — SODIUM CHLORIDE 0.9% FLUSH
3.0000 mL | Freq: Two times a day (BID) | INTRAVENOUS | Status: DC
  Administered 2021-06-24 (×2): 3 mL via INTRAVENOUS

## 2021-06-24 MED ORDER — FENTANYL CITRATE (PF) 100 MCG/2ML IJ SOLN
INTRAMUSCULAR | Status: DC | PRN
  Administered 2021-06-24 (×3): 25 ug via INTRAVENOUS

## 2021-06-24 MED ORDER — ONDANSETRON HCL 4 MG/2ML IJ SOLN
INTRAMUSCULAR | Status: DC | PRN
  Administered 2021-06-24: 4 mg via INTRAVENOUS

## 2021-06-24 MED ORDER — MELATONIN 3 MG PO TABS
3.0000 mg | ORAL_TABLET | Freq: Every day | ORAL | Status: DC
  Administered 2021-06-24 (×2): 3 mg via ORAL
  Filled 2021-06-24 (×2): qty 1

## 2021-06-24 MED ORDER — SODIUM CHLORIDE 0.9 % IV SOLN
INTRAVENOUS | Status: AC | PRN
  Administered 2021-06-24: 250 mL via INTRAVENOUS

## 2021-06-24 MED ORDER — SODIUM CHLORIDE 0.9 % IV SOLN
250.0000 mL | INTRAVENOUS | Status: DC | PRN
Start: 2021-06-24 — End: 2021-06-24

## 2021-06-24 MED ORDER — ADENOSINE 12 MG/4ML IV SOLN
INTRAVENOUS | Status: AC
Start: 2021-06-24 — End: ?
  Filled 2021-06-24: qty 4

## 2021-06-24 MED ORDER — SODIUM CHLORIDE 0.9 % IV SOLN: INTRAVENOUS | Status: AC

## 2021-06-24 MED ORDER — FAMOTIDINE IN NACL 20-0.9 MG/50ML-% IV SOLN
INTRAVENOUS | Status: AC | PRN
  Administered 2021-06-24: 20 mg via INTRAVENOUS

## 2021-06-24 MED ORDER — SODIUM CHLORIDE 0.9 % IV SOLN
INTRAVENOUS | Status: AC | PRN
  Administered 2021-06-24: 10 mL/h via INTRAVENOUS

## 2021-06-24 SURGICAL SUPPLY — 25 items
BALLN SAPPHIRE 2.5X12 (BALLOONS) ×2
BALLOON SAPPHIRE 2.5X12 (BALLOONS) IMPLANT
CATH INFINITI 5 FR IM (CATHETERS) ×1 IMPLANT
CATH INFINITI 5FR AL1 (CATHETERS) ×1 IMPLANT
CATH INFINITI 5FR MULTPACK ANG (CATHETERS) ×1 IMPLANT
CATH LAUNCHER 5F EBU3.5 (CATHETERS) ×1 IMPLANT
CATH SWAN GANZ 7F STRAIGHT (CATHETERS) ×1 IMPLANT
CLOSURE MYNX CONTROL 5F (Vascular Products) ×1 IMPLANT
GLIDESHEATH SLEND SS 6F .021 (SHEATH) IMPLANT
GUIDEWIRE INQWIRE 1.5J.035X260 (WIRE) IMPLANT
GUIDEWIRE PRESSURE X 175 (WIRE) ×1 IMPLANT
INQWIRE 1.5J .035X260CM (WIRE)
KIT ENCORE 26 ADVANTAGE (KITS) ×1 IMPLANT
KIT HEART LEFT (KITS) ×3 IMPLANT
PACK CARDIAC CATHETERIZATION (CUSTOM PROCEDURE TRAY) ×3 IMPLANT
SHEATH GLIDE SLENDER 4/5FR (SHEATH) IMPLANT
SHEATH PINNACLE 5F 10CM (SHEATH) ×1 IMPLANT
SHEATH PINNACLE 6F 10CM (SHEATH) ×1 IMPLANT
SHEATH PINNACLE 7F 10CM (SHEATH) ×1 IMPLANT
SHEATH PROBE COVER 6X72 (BAG) ×1 IMPLANT
STENT SYNERGY XD 3.0X16 (Permanent Stent) IMPLANT
SYNERGY XD 3.0X16 (Permanent Stent) ×2 IMPLANT
TRANSDUCER W/STOPCOCK (MISCELLANEOUS) ×3 IMPLANT
TUBING CIL FLEX 10 FLL-RA (TUBING) ×3 IMPLANT
WIRE MICRO SET SILHO 5FR 7 (SHEATH) ×1 IMPLANT

## 2021-06-24 NOTE — Progress Notes (Signed)
Site area: rt groin fv sheath Site Prior to Removal:  Level 0 Pressure Applied For: 15 minutes Manual:   yes Patient Status During Pull:  stable Post Pull Site:  Level 0 Post Pull Instructions Given:  yes Post Pull Pulses Present: rt dp palpable Dressing Applied:  gauze and tegaderm Bedrest begins at 1845  Comments:

## 2021-06-24 NOTE — Progress Notes (Signed)
 Progress Note  Patient Name: [name] Date of Encounter: 06/24/2021  [facility] Cardiologist: Peter Jordan, MD   Subjective   No chest pain or dyspnea.   Inpatient Medications    Scheduled Meds:  aspirin  81 mg Oral Pre-Cath   aspirin EC  81 mg Oral Daily   carvedilol  3.125 mg Oral BID WC   empagliflozin  10 mg Oral Daily   furosemide  40 mg Intravenous Q12H   heparin  5,000 Units Subcutaneous Q8H   irbesartan  75 mg Oral Daily   melatonin  3 mg Oral QHS   sodium chloride flush  3 mL Intravenous Q12H   sodium chloride flush  3 mL Intravenous Q12H   Continuous Infusions:  sodium chloride     sodium chloride     sodium chloride     sodium chloride     PRN Meds: sodium chloride, sodium chloride, sodium chloride, acetaminophen, nitroGLYCERIN, ondansetron (ZOFRAN) IV, sodium chloride flush, sodium chloride flush, sodium chloride flush, zolpidem   Vital Signs    Vitals:   06/23/21 1745 06/23/21 2148 06/24/21 0015 06/24/21 0755  BP: (!) 136/103 135/80 127/82 129/75  Pulse: 78 81 77 79  Resp: 15 16 15 18  Temp:   98.1 F (36.7 C) 98 F (36.7 C)  TempSrc:   Oral Oral  SpO2: 93% 96% 94% 96%  Weight:      Height:        Intake/Output Summary (Last 24 hours) at 06/24/2021 0919 Last data filed at 06/23/2021 2155 Gross per 24 hour  Intake --  Output 1850 ml  Net -1850 ml      06/23/2021    1:57 PM 06/20/2021   10:33 AM 06/17/2021    9:01 AM  Last 3 Weights  Weight (lbs) 231 lb 7.7 oz 232 lb 235 lb 3.2 oz  Weight (kg) 105 kg 105.235 kg 106.686 kg      Telemetry    Paced - Personally Reviewed  ECG    Ventricular pacing - Personally Reviewed  Physical Exam   GEN: No acute distress.   Neck: No JVD Cardiac: RRR, no murmurs, rubs, or gallops.  Respiratory: Clear to auscultation bilaterally. GI: Soft, nontender, non-distended  MS: No edema; No deformity. Neuro:  Nonfocal  Psych: Normal affect   Labs    High Sensitivity Troponin:   Recent  Labs  Lab 06/23/21 1408 06/23/21 1615  TROPONINIHS 22* 27*     Chemistry Recent Labs  Lab 06/23/21 1408 06/24/21 0151  NA 140 140  K 4.1 4.2  CL 105 100  CO2 25 32  GLUCOSE 106* 93  BUN 11 12  CREATININE 1.03 1.22  CALCIUM 9.0 9.1  PROT 6.4*  --   ALBUMIN 3.7  --   AST 25  --   ALT 18  --   ALKPHOS 50  --   BILITOT 1.5*  --   GFRNONAA >60 >60  ANIONGAP 10 8    Lipids No results for input(s): "CHOL", "TRIG", "HDL", "LABVLDL", "LDLCALC", "CHOLHDL" in the last 168 hours.  Hematology Recent Labs  Lab 06/23/21 1408 06/24/21 0151  WBC 8.4 7.2  RBC 4.73 4.65  HGB 15.0 14.5  HCT 45.0 44.7  MCV 95.1 96.1  MCH 31.7 31.2  MCHC 33.3 32.4  RDW 14.3 14.5  PLT 209 200   Thyroid No results for input(s): "TSH", "FREET4" in the last 168 hours.  BNP Recent Labs  Lab 06/23/21 1408  BNP 338.6*      DDimer  Recent Labs  Lab 06/23/21 1408  DDIMER 1.56*     Radiology    VAS [place] LOWER EXTREMITY VENOUS (DVT) (ONLY [facility] & [facility])  Result Date: 06/23/2021  Lower Venous DVT Study Patient Name:  [name]  Date of Exam:   06/23/2021 Medical Rec #: [id number]      Accession #:    [id number] Date of Birth: 05-Aug-1955      Patient Gender: M Patient Age:   66 years Exam Location:  [facility] Procedure:      VAS [place] LOWER EXTREMITY VENOUS (DVT) Referring Phys: Alvira Monday --------------------------------------------------------------------------------  Indications: Swelling, right>left, short of breath.  Comparison Study: No prior studies. Performing Technologist: Jean Rosenthal RDMS, RVT  Examination Guidelines: A complete evaluation includes B-mode imaging, spectral Doppler, color Doppler, and power Doppler as needed of all accessible portions of each vessel. Bilateral testing is considered an integral part of a complete examination. Limited examinations for reoccurring indications may be performed as noted. The reflux portion of the exam is performed with the patient in reverse  Trendelenburg.  +---------+---------------+---------+-----------+---------------+--------------+ RIGHT    CompressibilityPhasicitySpontaneityProperties     Thrombus Aging +---------+---------------+---------+-----------+---------------+--------------+ CFV      Full           Yes      Yes                                      +---------+---------------+---------+-----------+---------------+--------------+ SFJ      Full                                                             +---------+---------------+---------+-----------+---------------+--------------+ FV Prox  Full                                                             +---------+---------------+---------+-----------+---------------+--------------+ FV Mid   Full                                                             +---------+---------------+---------+-----------+---------------+--------------+ FV DistalFull                                                             +---------+---------------+---------+-----------+---------------+--------------+ PFV      Full                                                             +---------+---------------+---------+-----------+---------------+--------------+ POP      Full  Yes      Yes                                      +---------+---------------+---------+-----------+---------------+--------------+ PTV      Full                                                             +---------+---------------+---------+-----------+---------------+--------------+ PERO     Full                                                             +---------+---------------+---------+-----------+---------------+--------------+ Gastroc  Full           Yes      Yes        Prolonged flow                                                            reversal noted                                                            in the GSV at                                                              patient's area                                                            of concern                    +---------+---------------+---------+-----------+---------------+--------------+   +---------+---------------+---------+-----------+----------+--------------+ LEFT     CompressibilityPhasicitySpontaneityPropertiesThrombus Aging +---------+---------------+---------+-----------+----------+--------------+ CFV      Full           Yes      Yes                                 +---------+---------------+---------+-----------+----------+--------------+ SFJ      Full                                                        +---------+---------------+---------+-----------+----------+--------------+  FV Prox  Full                                                        +---------+---------------+---------+-----------+----------+--------------+ FV Mid   Full                                                        +---------+---------------+---------+-----------+----------+--------------+ FV DistalFull                                                        +---------+---------------+---------+-----------+----------+--------------+ PFV      Full                                                        +---------+---------------+---------+-----------+----------+--------------+ POP      Full           Yes      Yes                                 +---------+---------------+---------+-----------+----------+--------------+ PTV      Full                                                        +---------+---------------+---------+-----------+----------+--------------+ PERO     Full                                                        +---------+---------------+---------+-----------+----------+--------------+ Gastroc  Full                                                         +---------+---------------+---------+-----------+----------+--------------+     Summary: RIGHT: - There is no evidence of deep vein thrombosis in the lower extremity.  - No cystic structure found in the popliteal fossa.  - Incidental: Prolonged flow reversal noted in the distal greater saphenous vein at patient's area of concern/palpation.  LEFT: - There is no evidence of deep vein thrombosis in the lower extremity.  - No cystic structure found in the popliteal fossa.  *See table(s) above for measurements and observations. Electronically signed by Sherald Hess MD on 06/23/2021 at 7:46:30 PM.    Final    CT Angio Chest PE W and/or Wo Contrast  Result Date: 06/23/2021 CLINICAL DATA:  Short of breath.  History of previous myocardial infarctions and CABG surgery. No current chest pain. EXAM: CT ANGIOGRAPHY CHEST WITH CONTRAST TECHNIQUE: Multidetector CT imaging of the chest was performed using the standard protocol during bolus administration of intravenous contrast. Multiplanar CT image reconstructions and MIPs were obtained to evaluate the vascular anatomy. RADIATION DOSE REDUCTION: This exam was performed according to the departmental dose-optimization program which includes automated exposure control, adjustment of the mA and/or kV according to patient size and/or use of iterative reconstruction technique. CONTRAST:  71mL OMNIPAQUE IOHEXOL 350 MG/ML SOLN COMPARISON:  CT a chest, 05/15/2021. FINDINGS: Cardiovascular: Pulmonary arteries are well opacified. There is no evidence of a pulmonary embolism. Heart is mildly enlarged. No pericardial effusion. Coronary artery calcifications and changes from prior CABG surgery. There are surgical clips along the ascending aorta. Ascending aorta is dilated to a maximum 4.3 cm. This is stable from the prior study a allowing for differences in measurement technique. Mediastinum/Nodes: No neck base, mediastinal or hilar masses or enlarged lymph nodes. Trachea and esophagus  are unremarkable. Lungs/Pleura: Small left effusion. There is dependent opacity in the left lower lobe consistent with atelectasis, similar to the prior study. Bilateral interstitial thickening. There are ground-glass opacities bilaterally, most evident in the posterior right upper lobe, new since the prior CT. Right pleural effusion seen previously has resolved. Stable centrilobular emphysema.  No pneumothorax. Upper Abdomen: No acute abnormality. Musculoskeletal: No fracture or acute finding. No bone lesion. No chest wall mass. Review of the MIP images confirms the above findings. IMPRESSION: 1. No evidence of a pulmonary embolism. 2. New bilateral ground-glass lung opacities. Differential diagnosis includes multifocal pneumonia and asymmetric pulmonary edema. Associated interstitial thickening supports asymmetric pulmonary edema. 3. Small left pleural effusion, unchanged from the prior CT. There is enhancement along the parietal and visceral pleura surrounding the effusion. Consider an empyema if there are consistent clinical findings. There is associated dependent left lower lobe opacity adjacent to this pleural effusion, also stable and consistent with rounded atelectasis. 4. Dilated ascending thoracic aorta to 4.3 cm, unchanged from the recent prior exam. Recommend annual imaging followup by CTA or MRA. This recommendation follows 2010 ACCF/AHA/AATS/ACR/ASA/SCA/SCAI/SIR/STS/SVM Guidelines for the Diagnosis and Management of Patients with Thoracic Aortic Disease. Circulation. 2010; 121: J570-V779. Aortic aneurysm NOS (ICD10-I71.9) 5. Coronary artery calcifications and aortic atherosclerosis. Aortic Atherosclerosis (ICD10-I70.0) and Emphysema (ICD10-J43.9). Electronically Signed   By: Amie Portland M.D.   On: 06/23/2021 17:01   DG Chest Portable 1 View  Result Date: 06/23/2021 CLINICAL DATA:  Shortness of breath EXAM: PORTABLE CHEST 1 VIEW COMPARISON:  May 15, 2021 FINDINGS: The heart size and mediastinal  contours are stable. The heart size is enlarged. Cardiac pacemaker and pacer leads are unchanged. Mild increased pulmonary interstitium is identified bilaterally. Small left pleural effusion is identified. Patchy opacity of the medial left lung base is noted. The visualized skeletal structures are stable. IMPRESSION: 1. Mild congestive heart failure. 2. Small left pleural effusion. 3. Patchy opacity of the medial left lung base, superimposed pneumonia is not excluded. Electronically Signed   By: Sherian Rein M.D.   On: 06/23/2021 14:29    Cardiac Studies     Patient Profile     66 y.o. male with history of CAD s/p 4V CABG (LIMA-LAD, SVG-diagonal, SVG-ramus intermediate, and SVG-RCA with occlusion of graft to intermediate by cath in 2022), former tobacco abuse, chronic systolic CHF, ICM s/p ICD, HTN, HLD, GERD who admitted with dyspnea. Recently seen in our office and his dyspnea was felt to  be a possible anginal equivalent. Troponin is 22-->27. Chest CTA with no PE.  There is evidence of pulmonary edema on chest CT.   Assessment & Plan    Ischemic cardiomyopathy/Acute on chronic systolic CHF: Elevated BNP on admission with pulmonary edema, pleural effusion on chest CTA. No significant overload on exam today. 1.8 liters net negative since admission. Will stop IV Lasix today. ICD in place. Echo last week with LVEF=30-35%. Continue Jardiance and ARB. Consider spironolactone before discharge but he has refused in the past.   CAD s/p CABG with unstable angina: Fatigue and dyspnea which are his previous anginal equivalent prior to bypass. Minimal elevation of troponin. No chest pain. Cardiac cath was planned for next week as an outpatient. Will plan right and left heart cath today. He is statin intolerant and on Repatha as an outpatient. Continue ASA and beta blocker.  HTN: BP controlled.  HLD: Continue statin.  For questions or updates, please contact [facility] Please consult [email or web address] for  contact info under        Signed, Verne Carrow, MD  06/24/2021, 9:19 AM

## 2021-06-24 NOTE — Interval H&P Note (Signed)
History and Physical Interval Note:  06/24/2021 3:29 PM  [name]  has presented today for surgery, with the diagnosis of unstable angina.  The various methods of treatment have been discussed with the patient and family. After consideration of risks, benefits and other options for treatment, the patient has consented to  Procedure(s): RIGHT/LEFT HEART CATH AND CORONARY/GRAFT ANGIOGRAPHY (N/A)  PERCUTANEOUS CORONARY INTERVENTION  as a surgical intervention.  The patient's history has been reviewed, patient examined, no change in status, stable for surgery.  I have reviewed the patient's chart and labs.  Questions were answered to the patient's satisfaction.    Cath Lab Visit (complete for each Cath Lab visit)  Clinical Evaluation Leading to the Procedure:   ACS: Yes.    Non-ACS:    Anginal Classification: CCS III  Anti-ischemic medical therapy: Minimal Therapy (1 class of medications)  Non-Invasive Test Results: No non-invasive testing performed  Prior CABG: Previous CABG    Bryan Lemma

## 2021-06-24 NOTE — H&P (View-Only) (Signed)
Progress Note  Patient Name: [name] Date of Encounter: 06/24/2021  [facility] Cardiologist: Peter Swaziland, MD   Subjective   No chest pain or dyspnea.   Inpatient Medications    Scheduled Meds:  aspirin  81 mg Oral Pre-Cath   aspirin EC  81 mg Oral Daily   carvedilol  3.125 mg Oral BID WC   empagliflozin  10 mg Oral Daily   furosemide  40 mg Intravenous Q12H   heparin  5,000 Units Subcutaneous Q8H   irbesartan  75 mg Oral Daily   melatonin  3 mg Oral QHS   sodium chloride flush  3 mL Intravenous Q12H   sodium chloride flush  3 mL Intravenous Q12H   Continuous Infusions:  sodium chloride     sodium chloride     sodium chloride     sodium chloride     PRN Meds: sodium chloride, sodium chloride, sodium chloride, acetaminophen, nitroGLYCERIN, ondansetron (ZOFRAN) IV, sodium chloride flush, sodium chloride flush, sodium chloride flush, zolpidem   Vital Signs    Vitals:   06/23/21 1745 06/23/21 2148 06/24/21 0015 06/24/21 0755  BP: (!) 136/103 135/80 127/82 129/75  Pulse: 78 81 77 79  Resp: Temp:   98.1 F (36.7 C) 98 F (36.7 C)  TempSrc:   Oral Oral  SpO2: 93% 96% 94% 96%  Weight:      Height:        Intake/Output Summary (Last 24 hours) at 06/24/2021 0919 Last data filed at 06/23/2021 2155 Gross per 24 hour  Intake --  Output 1850 ml  Net -1850 ml      06/23/2021    1:57 PM 06/20/2021   10:33 AM 06/17/2021    9:01 AM  Last 3 Weights  Weight (lbs) 231 lb 7.7 oz 232 lb 235 lb 3.2 oz  Weight (kg) 105 kg 105.235 kg 106.686 kg      Telemetry    Paced - Personally Reviewed  ECG    Ventricular pacing - Personally Reviewed  Physical Exam   GEN: No acute distress.   Neck: No JVD Cardiac: RRR, no murmurs, rubs, or gallops.  Respiratory: Clear to auscultation bilaterally. GI: Soft, nontender, non-distended  MS: No edema; No deformity. Neuro:  Nonfocal  Psych: Normal affect   Labs    High Sensitivity Troponin:   Recent  Labs  Lab 06/23/21 1408 06/23/21 1615  TROPONINIHS 22* 27*     Chemistry Recent Labs  Lab 06/23/21 1408 06/24/21 0151  NA 140 140  K 4.1 4.2  CL 105 100  CO2 25 32  GLUCOSE 106* 93  BUN 11 12  CREATININE 1.03 1.22  CALCIUM 9.0 9.1  PROT 6.4*  --   ALBUMIN 3.7  --   AST 25  --   ALT 18  --   ALKPHOS 50  --   BILITOT 1.5*  --   GFRNONAA >60 >60  ANIONGAP 10 8    Lipids No results for input(s): "CHOL", "TRIG", "HDL", "LABVLDL", "LDLCALC", "CHOLHDL" in the last 168 hours.  Hematology Recent Labs  Lab 06/23/21 1408 06/24/21 0151  WBC 8.4 7.2  RBC 4.73 4.65  HGB 15.0 14.5  HCT 45.0 44.7  MCV 95.1 96.1  MCH 31.7 31.2  MCHC 33.3 32.4  RDW 14.3 14.5  PLT 209 200   Thyroid No results for input(s): "TSH", "FREET4" in the last 168 hours.  BNP Recent Labs  Lab 06/23/21 1408  BNP 338.6*  DDimer  Recent Labs  Lab 06/23/21 1408  DDIMER 1.56*     Radiology    VAS [place] LOWER EXTREMITY VENOUS (DVT) (ONLY [facility] & [facility])  Result Date: 06/23/2021  Lower Venous DVT Study Patient Name:  BURGESS SHERIFF  Date of Exam:   06/23/2021 Medical Rec #: [id number]      Accession #:    [id number] Date of Birth: 05-Aug-1955      Patient Gender: M Patient Age:   66 years Exam Location:  [facility] Procedure:      VAS [place] LOWER EXTREMITY VENOUS (DVT) Referring Phys: Alvira Monday --------------------------------------------------------------------------------  Indications: Swelling, right>left, short of breath.  Comparison Study: No prior studies. Performing Technologist: Jean Rosenthal RDMS, RVT  Examination Guidelines: A complete evaluation includes B-mode imaging, spectral Doppler, color Doppler, and power Doppler as needed of all accessible portions of each vessel. Bilateral testing is considered an integral part of a complete examination. Limited examinations for reoccurring indications may be performed as noted. The reflux portion of the exam is performed with the patient in reverse  Trendelenburg.  +---------+---------------+---------+-----------+---------------+--------------+ RIGHT    CompressibilityPhasicitySpontaneityProperties     Thrombus Aging +---------+---------------+---------+-----------+---------------+--------------+ CFV      Full           Yes      Yes                                      +---------+---------------+---------+-----------+---------------+--------------+ SFJ      Full                                                             +---------+---------------+---------+-----------+---------------+--------------+ FV Prox  Full                                                             +---------+---------------+---------+-----------+---------------+--------------+ FV Mid   Full                                                             +---------+---------------+---------+-----------+---------------+--------------+ FV DistalFull                                                             +---------+---------------+---------+-----------+---------------+--------------+ PFV      Full                                                             +---------+---------------+---------+-----------+---------------+--------------+ POP      Full  Yes      Yes                                      +---------+---------------+---------+-----------+---------------+--------------+ PTV      Full                                                             +---------+---------------+---------+-----------+---------------+--------------+ PERO     Full                                                             +---------+---------------+---------+-----------+---------------+--------------+ Gastroc  Full           Yes      Yes        Prolonged flow                                                            reversal noted                                                            in the GSV at                                                              patient's area                                                            of concern                    +---------+---------------+---------+-----------+---------------+--------------+   +---------+---------------+---------+-----------+----------+--------------+ LEFT     CompressibilityPhasicitySpontaneityPropertiesThrombus Aging +---------+---------------+---------+-----------+----------+--------------+ CFV      Full           Yes      Yes                                 +---------+---------------+---------+-----------+----------+--------------+ SFJ      Full                                                        +---------+---------------+---------+-----------+----------+--------------+  FV Prox  Full                                                        +---------+---------------+---------+-----------+----------+--------------+ FV Mid   Full                                                        +---------+---------------+---------+-----------+----------+--------------+ FV DistalFull                                                        +---------+---------------+---------+-----------+----------+--------------+ PFV      Full                                                        +---------+---------------+---------+-----------+----------+--------------+ POP      Full           Yes      Yes                                 +---------+---------------+---------+-----------+----------+--------------+ PTV      Full                                                        +---------+---------------+---------+-----------+----------+--------------+ PERO     Full                                                        +---------+---------------+---------+-----------+----------+--------------+ Gastroc  Full                                                         +---------+---------------+---------+-----------+----------+--------------+     Summary: RIGHT: - There is no evidence of deep vein thrombosis in the lower extremity.  - No cystic structure found in the popliteal fossa.  - Incidental: Prolonged flow reversal noted in the distal greater saphenous vein at patient's area of concern/palpation.  LEFT: - There is no evidence of deep vein thrombosis in the lower extremity.  - No cystic structure found in the popliteal fossa.  *See table(s) above for measurements and observations. Electronically signed by Sherald Hess MD on 06/23/2021 at 7:46:30 PM.    Final    CT Angio Chest PE W and/or Wo Contrast  Result Date: 06/23/2021 CLINICAL DATA:  Short of breath.  History of previous myocardial infarctions and CABG surgery. No current chest pain. EXAM: CT ANGIOGRAPHY CHEST WITH CONTRAST TECHNIQUE: Multidetector CT imaging of the chest was performed using the standard protocol during bolus administration of intravenous contrast. Multiplanar CT image reconstructions and MIPs were obtained to evaluate the vascular anatomy. RADIATION DOSE REDUCTION: This exam was performed according to the departmental dose-optimization program which includes automated exposure control, adjustment of the mA and/or kV according to patient size and/or use of iterative reconstruction technique. CONTRAST:  71mL OMNIPAQUE IOHEXOL 350 MG/ML SOLN COMPARISON:  CT a chest, 05/15/2021. FINDINGS: Cardiovascular: Pulmonary arteries are well opacified. There is no evidence of a pulmonary embolism. Heart is mildly enlarged. No pericardial effusion. Coronary artery calcifications and changes from prior CABG surgery. There are surgical clips along the ascending aorta. Ascending aorta is dilated to a maximum 4.3 cm. This is stable from the prior study a allowing for differences in measurement technique. Mediastinum/Nodes: No neck base, mediastinal or hilar masses or enlarged lymph nodes. Trachea and esophagus  are unremarkable. Lungs/Pleura: Small left effusion. There is dependent opacity in the left lower lobe consistent with atelectasis, similar to the prior study. Bilateral interstitial thickening. There are ground-glass opacities bilaterally, most evident in the posterior right upper lobe, new since the prior CT. Right pleural effusion seen previously has resolved. Stable centrilobular emphysema.  No pneumothorax. Upper Abdomen: No acute abnormality. Musculoskeletal: No fracture or acute finding. No bone lesion. No chest wall mass. Review of the MIP images confirms the above findings. IMPRESSION: 1. No evidence of a pulmonary embolism. 2. New bilateral ground-glass lung opacities. Differential diagnosis includes multifocal pneumonia and asymmetric pulmonary edema. Associated interstitial thickening supports asymmetric pulmonary edema. 3. Small left pleural effusion, unchanged from the prior CT. There is enhancement along the parietal and visceral pleura surrounding the effusion. Consider an empyema if there are consistent clinical findings. There is associated dependent left lower lobe opacity adjacent to this pleural effusion, also stable and consistent with rounded atelectasis. 4. Dilated ascending thoracic aorta to 4.3 cm, unchanged from the recent prior exam. Recommend annual imaging followup by CTA or MRA. This recommendation follows 2010 ACCF/AHA/AATS/ACR/ASA/SCA/SCAI/SIR/STS/SVM Guidelines for the Diagnosis and Management of Patients with Thoracic Aortic Disease. Circulation. 2010; 121: J570-V779. Aortic aneurysm NOS (ICD10-I71.9) 5. Coronary artery calcifications and aortic atherosclerosis. Aortic Atherosclerosis (ICD10-I70.0) and Emphysema (ICD10-J43.9). Electronically Signed   By: Amie Portland M.D.   On: 06/23/2021 17:01   DG Chest Portable 1 View  Result Date: 06/23/2021 CLINICAL DATA:  Shortness of breath EXAM: PORTABLE CHEST 1 VIEW COMPARISON:  May 15, 2021 FINDINGS: The heart size and mediastinal  contours are stable. The heart size is enlarged. Cardiac pacemaker and pacer leads are unchanged. Mild increased pulmonary interstitium is identified bilaterally. Small left pleural effusion is identified. Patchy opacity of the medial left lung base is noted. The visualized skeletal structures are stable. IMPRESSION: 1. Mild congestive heart failure. 2. Small left pleural effusion. 3. Patchy opacity of the medial left lung base, superimposed pneumonia is not excluded. Electronically Signed   By: Sherian Rein M.D.   On: 06/23/2021 14:29    Cardiac Studies     Patient Profile     66 y.o. male with history of CAD s/p 4V CABG (LIMA-LAD, SVG-diagonal, SVG-ramus intermediate, and SVG-RCA with occlusion of graft to intermediate by cath in 2022), former tobacco abuse, chronic systolic CHF, ICM s/p ICD, HTN, HLD, GERD who admitted with dyspnea. Recently seen in our office and his dyspnea was felt to  be a possible anginal equivalent. Troponin is 22-->27. Chest CTA with no PE.  There is evidence of pulmonary edema on chest CT.   Assessment & Plan    Ischemic cardiomyopathy/Acute on chronic systolic CHF: Elevated BNP on admission with pulmonary edema, pleural effusion on chest CTA. No significant overload on exam today. 1.8 liters net negative since admission. Will stop IV Lasix today. ICD in place. Echo last week with LVEF=30-35%. Continue Jardiance and ARB. Consider spironolactone before discharge but he has refused in the past.   CAD s/p CABG with unstable angina: Fatigue and dyspnea which are his previous anginal equivalent prior to bypass. Minimal elevation of troponin. No chest pain. Cardiac cath was planned for next week as an outpatient. Will plan right and left heart cath today. He is statin intolerant and on Repatha as an outpatient. Continue ASA and beta blocker.  HTN: BP controlled.  HLD: Continue statin.  For questions or updates, please contact [facility] Please consult [email or web address] for  contact info under        Signed, Verne Carrow, MD  06/24/2021, 9:19 AM

## 2021-06-24 NOTE — Brief Op Note (Addendum)
BRIEF CARDIAC CATH-PCI NOTE  Patient Name: [name] 06/24/2021 5:31 PM  Primary care provider: Ave Filter.,  MD Primary Cardiologist: Dr. Peter Swaziland  Referring cardiologist: Verne Carrow, MD  SURGEON:  Surgeon(s) and Role:    * Marykay Lex, MD - Primary   PROCEDURE:  Procedure(s): RIGHT/LEFT HEART CATH AND CORONARY/GRAFT ANGIOGRAPHY (N/A) CORONARY STENT INTERVENTION (N/A) INTRAVASCULAR PRESSURE WIRE/FFR STUDY (N/A)  PATIENT:  [name] is a 66 y.o. male with history of CAD s/p 4V CABG (LIMA-LAD, SVG-diagonal, SVG-ramus intermediate, and SVG-RCA with occlusion of graft to intermediate by cath in 2022), former tobacco abuse, chronic systolic CHF, ICM s/p ICD, HTN, HLD, GERD who admitted with dyspnea. Recently seen in our office and his dyspnea was felt to be a possible anginal equivalent. Troponin is 22-->27. He had elevated BNP of admission with some pulmonary edema and pleural effusion.  Was initially diuresed.  His epigastric of fatigue and exertional dyspnea similar to previous anginal equivalent.  Had cardiac catheterization planned for next week to reevaluate a ramus intermedius lesion noted to be intermediate back in September 2022 treated medically.  PRE-OPERATIVE DIAGNOSIS: Coronary artery disease with unstable angina  POST-OPERATIVE DIAGNOSIS:   Stable Three-Vessel CAD: Ostial mid OM 40%.  Proximal to mid LAD 100%.  Proximal RCA 90% at bend with trace filling of the PDA and PL.  Patent LCx that is small and nondominant. Culprit lesion: Progression of proximal Ramus Intermedius (RI) lesion from 60% to 70-80% -> RFR 0.90, 0.91 with a FFR of 0.80.  Based on his symptoms thought to be significant. Successful DES PCI of ostial and proximal L RI using Synergy DES 3.0 mm x 16 mm deployed to 3.3 mm. Reducing lesion from 70-80% to 0% TIMI-3 flow pre and post. Known occluded SVG-RI with otherwise patent SVG-PDA, SVG-D1, and LIMA-LAD RHC pressures  relatively normal:  RAP 2 mmHg, RVP-EDP 28/0-0 mmHg; PAP-mean 19/10-15 mmHg, PCWP 16 mmHg; LVP-EDP 120/1-12 mmHg,  AoP-MAP 116/54 mmHg - 72 mmHg;  Ao sat 98%, PA sat 69% Cardiac Output-Index: Fick 5.32-2.33; TD 3.21-1.4 (suggestive of valvular disease.  PROCEDURE PERFORMED Time Out: Verified patient identification, verified procedure, site/side was marked, verified correct patient position, special equipment/implants available, medications/allergies/relevent history reviewed, required imaging and test results available. Performed.  Access:  *Right Common Femoral Artery: 5 Fr Sheath - fluoroscopically guided modified Seldinger Technique  Direct ultrasound guidance used.  Permanent image obtained and placed on chart. *Right Common Femoral Vein: 7 Fr Sheath - Seldinger Technique.. Direct ultrasound guidance used.  Permanent image obtained and placed on chart.  Right Heart Catheterization: 7 Fr Theone Murdoch catheter advanced under fluoroscopy with balloon inflated to the RA, RV, then PCWP-PA for hemodynamic measurement.  * Simultaneous FA & PA blood gases checked for SaO2% to calculate FICK CO/CI  * Thermodilution Injections performed to calculate CO/CI  * Catheter removed completely out of the body with balloon deflated.  Left Heart Catheterization: 5 Fr Catheters advanced or exchanged over a J-wire under direct fluoroscopic guidance into the ascending aorta; JL 4 catheter advanced first.  * LV Hemodynamics (LV Gram): JR4 * Left Coronary Artery Cineangiography: JL 4 catheter  * Right Coronary Artery, SVG-RCA Cineangiography: JR4 catheter  * LIMA-LAD Cineangiography: JR4 catheter redirected into Left Subclavian Artery & exchanged over long-exchange wire for IMA catheter. SVG-D1 & SVG-RI cineangiography: AL-1 catheter  Initial images revealed likely progression of the proximal RI lesion now somewhat significant.  Preparations made for RFR-FFR measurement.  5 [place] sheath  maintained, =>  additional bolus of IV heparin administered to achieve and maintain ACT greater than 250 SEC  RFR-FFR: 5 [place] EBU 3.5 guide catheter with pressure X wire--RFR was 0.91-0.90 => decision was made to convert to FFR (140 mcg/KG/min adenosine administered for 2:30 min => final FFR 0.80 borderline, but with symptoms, reviewed with Dr. Clifton Darrel we decided this was significant enough to proceed with PCI.  -> With a pressure X wire in place, the lesion was predilated with a 2.5 mm x 12 mm balloon and stented with a synergy XD DES 3.0 mm x 16 mm deployed to 3.3 mm.  Upon completion of Angiogaphy, the catheter was removed completely out of the body over a wire, without complication.  Femoral arterial sheath was removed in the Cath Lab with Mynx closure device placed. Venous sheath will be pulled with manual pressure held hemostasis.  MEDICATIONS * SQ Lidocaine 14mL * Radial Cocktail: 3 mg Verapmil in 10 mL NS * Isovue Contrast: 165 mL * Plavix 600 mg * Heparin: Total 11,000 units * IA NTG 200 mcg x 1  * Zofran 4 mg IV * Pepcid 20 mg IV *250 mL NS bolus  COUNTS:  YES  DICTATION: .Note written in EPIC  PLAN OF CARE:  Return to nursing floor for post PCI care after sheath removal. ->  Dual and platelet therapy for 6 months, then okay to stop aspirin.  Seems relatively euvolemic on right heart cath,.  PATIENT DISPOSITION:  PACU - hemodynamically stable.   Delay start of Pharmacological VTE agent (>24hrs) due to surgical blood loss or risk of bleeding: not applicable    Bryan Lemma, MD

## 2021-06-25 ENCOUNTER — Encounter (HOSPITAL_COMMUNITY): Payer: Self-pay | Admitting: Cardiology

## 2021-06-25 DIAGNOSIS — I200 Unstable angina: Secondary | ICD-10-CM

## 2021-06-25 LAB — CBC
HCT: 44.8 % (ref 39.0–52.0)
Hemoglobin: 14.3 g/dL (ref 13.0–17.0)
MCH: 30.5 pg (ref 26.0–34.0)
MCHC: 31.9 g/dL (ref 30.0–36.0)
MCV: 95.5 fL (ref 80.0–100.0)
Platelets: 199 10*3/uL (ref 150–400)
RBC: 4.69 MIL/uL (ref 4.22–5.81)
RDW: 14.5 % (ref 11.5–15.5)
WBC: 7 10*3/uL (ref 4.0–10.5)
nRBC: 0 % (ref 0.0–0.2)

## 2021-06-25 LAB — BASIC METABOLIC PANEL
Anion gap: 5 (ref 5–15)
BUN: 14 mg/dL (ref 8–23)
CO2: 30 mmol/L (ref 22–32)
Calcium: 9 mg/dL (ref 8.9–10.3)
Chloride: 104 mmol/L (ref 98–111)
Creatinine, Ser: 1 mg/dL (ref 0.61–1.24)
GFR, Estimated: >60 mL/min (ref >60)
Glucose, Bld: 70 mg/dL (ref 70–99)
Potassium: 4.8 mmol/L (ref 3.5–5.1)
Sodium: 139 mmol/L (ref 135–145)

## 2021-06-25 LAB — POCT ACTIVATED CLOTTING TIME: Activated Clotting Time: 251 seconds

## 2021-06-25 MED ORDER — PANTOPRAZOLE SODIUM 40 MG PO TBEC
40.0000 mg | DELAYED_RELEASE_TABLET | Freq: Every day | ORAL | 1 refills | Status: DC
Start: 2021-06-25 — End: 2021-12-17

## 2021-06-25 MED ORDER — NITROGLYCERIN 0.4 MG SL SUBL: 0.4000 mg | SUBLINGUAL_TABLET | SUBLINGUAL | 2 refills | Status: DC | PRN

## 2021-06-25 MED ORDER — CLOPIDOGREL BISULFATE 75 MG PO TABS: 75.0000 mg | ORAL_TABLET | Freq: Every day | ORAL | 5 refills | Status: DC

## 2021-06-25 MED FILL — Clopidogrel Bisulfate Tab 75 MG (Base Equiv): ORAL | Qty: 8 | Status: AC

## 2021-06-25 NOTE — TOC Transition Note (Signed)
Transition of Care [facility]) - CM/SW Discharge Note   Patient Details  Name: [name] MRN: [id number] Date of Birth: 02/10/1955  [facility] CM/SW Contact:  Kermit Balo, RN Phone Number: 06/25/2021, 11:41 AM   Clinical Narrative:    Pt discharging home with self care. No needs per TOC.   Final next level of care: Home/Self Care Barriers to Discharge: No Barriers Identified   Patient Goals and CMS Choice        Discharge Placement                       Discharge Plan and Services                                     Social Determinants of Health (SDOH) Interventions     Readmission Risk Interventions     No data to display

## 2021-06-25 NOTE — Progress Notes (Signed)
Pt has been ambulating independently. Sts he is much improved from PTA. No SOB or CP per pt. Discussed with pt stent, Plavix importance, restrictions, diet, exercise, daily wts, NTG, and CRPII. Pt very receptive. Will refer to [place] CRPII.  [id number] Ethelda Chick CES, ACSM 06/25/2021 9:43 AM

## 2021-06-25 NOTE — Discharge Summary (Addendum)
Discharge Summary    Patient ID: [name] MRN: [id number]; DOB: 1955/08/03  Admit date: 06/23/2021 Discharge date: 06/25/2021  PCP:  Wardell Honour, MD   [facility] Providers Cardiologist:  Peter Martinique, MD  Electrophysiologist:  Virl Axe, MD    Discharge Diagnoses    Principal Problem:   Acute on chronic systolic CHF (congestive heart failure), NYHA class 3 ([place]) Active Problems:   Hypertension   Hypercholesterolemia   CAD (coronary artery disease)   S/P CABG x 4    Diagnostic Studies/Procedures    Cath: 06/24/21    -------------- NATIVE CORONARIES------------   Prox RCA lesion is 90% stenosed.  Dist RCA lesion is 90% stenosed.   Ost LM to Mid LM lesion is 40% stenosed.   Prox LAD lesion is 100% stenosed prior to D1. Prox LAD to Mid LAD lesion is 100% stenosed following D1.   CULPRIT LESION: Ramus lesion is 75% stenosed.  (Borderline positive by RFR (0.91) and FFR 0.80) -> given the patient's symptoms, this is felt to be significant.   A drug-eluting stent was successfully placed using a SYNERGY XD 3.0X16 -> deployed to 3.3 mm.   Post intervention, there is a 0% residual stenosis.   -------------- GRAFTS -------------------------------   LIMA-LAD graft was visualized by angiography and is normal in caliber. The graft exhibits no disease. There is no competitive flow   SVG-D1 graft was visualized by angiography and is normal in caliber. The graft exhibits no disease, and the flow is not reversed.  There is retrograde filling of a major septal perforator trunk in the LAD that it would otherwise not be perfused by the native LAD or retrograde filling from the LIMA..   SVG-rPDA graft was visualized by angiography and is moderate in size.  The graft exhibits no disease. There is trivial competitive flow   SVG-RI graft was visualized by angiography, Origin lesion is 100% stenosed.   -------------- HEMODYNAMICS-----------------   LV end diastolic pressure is  normal.   There is no aortic valve stenosis.   POST-OPERATIVE DIAGNOSIS:   Stable Three-Vessel CAD: Ostial mid OM 40%.  Proximal to mid LAD 100%.  Proximal RCA 90% at bend with trace filling of the PDA and PL.  Patent LCx that is small and nondominant.   Culprit lesion: Progression of proximal Ramus Intermedius (RI) lesion from 60% to 70-80% -> RFR 0.90, 0.91 with a FFR of 0.80.  Based on his symptoms thought to be significant. Successful DES PCI of ostial and proximal L RI using Synergy DES 3.0 mm x 16 mm deployed to 3.3 mm. Reducing lesion from 70-80% to 0% TIMI-3 flow pre and post.   Known occluded SVG-RI with otherwise patent SVG-PDA, SVG-D1, and LIMA-LAD RHC pressures relatively normal:  RAP 2 mmHg, RVP-EDP 28/0-0 mmHg; PAP-mean 19/10-15 mmHg, PCWP 16 mmHg; LVP-EDP 120/1-12 mmHg,  AoP-MAP 116/54 mmHg - 72 mmHg; Ao sat 98%, PA sat 69% Cardiac Output-Index: Fick 5.32-2.33; TD 3.21-1.4 (suggestive of valvular disease.   PLAN OF CARE:  Return to nursing floor for post PCI care after sheath removal. ->  Dual and platelet therapy for 6 months, then okay to stop aspirin.  Seems relatively euvolemic on right heart cath.     Glenetta Hew, MD    Diagnostic Dominance: Right  Intervention   _____________   History of Present Illness     [name] is a 66 y.o. male with history of CAD s/p 4V CABG, former tobacco abuse, chronic systolic CHF, ICM  s/p ICD, HTN, HLD, GERD who is presenting to the ED with c/o dyspnea. He was hospitalized in June 2019 with flash pulmonary edema and ruled in for NSTEMI. Cardiac catheterization at the time showed severe three-vessel CAD.  He underwent CABG x 4 (LIMA-LAD, SVG-diagonal, SVG-ramus intermediate, and SVG-RCA).  He had a syncopal episode in June 2019.  He was noted to have wide-complex tachycardia on telemetry with heart rate in the 170s.  EF was 35 to 40%.  EP was consulted who felt underlying rhythm could be either V. tach or SVT.  Echocardiogram in  September 2019 showed persistently low EF of 25 to 30%.  He was unable to tolerate Entresto, spironolactone, or carvedilol due to orthostatic symptoms and lightheadedness.  He underwent biventricular ICD placement in March 2020 by Dr. Caryl Comes.  Repeat echocardiogram in July 2020 showed EF improved to 40 to 45%.  Lexiscan Myoview in August 2022 in the setting of chest pain and increased dyspnea showed EF decreased to 17%, large inferolateral scar and anterior apical ischemia.  Follow-up echocardiogram showed EF 40%. Cardiac catheterization in September 2022 showed occluded SVG to ramus however other grafts were patent.  He was evaluated in April 2023 for increased lower extremity edema. He was started on Lasix and Jardiance.  He was later evaluated in the ED in May 2023 acute shortness of breath and abdominal pain-he was positive for UTI.  Chest x-ray showed mild pulmonary edema with small left pleural effusion.  High sensitivity troponin was borderline. CT of the chest revealed 4.2 cm dilated ascending thoracic aorta chronic chronic atelectasis in the left lung base, chronic edema chest changes, distended gallbladder, and severe compression deformity of T12. He was seen in our office May 2023 with c/o bilateral LE edema after stopping Lasix. Echo in may 2023 with LVEF=30-35%. He was seen in our office 06/20/21 and had multiple complaints including fatigue, dizziness and LE edema. Cardiac cath was arranged for 07/04/21 with Dr. Martinique.    He presented to the ED 6/19 with c/o dyspnea. Initial O2 sats in the 80s per EMS notes. He denied chest pain. BNP 338. Troponin 22. D-dimer 1.56. Chest x-ray with mild pulmonary edema. Chest CTA pending to exclude PE. LE u/s with no evidence of DVT. Comfortable at rest.   Hospital Course     Ischemic cardiomyopathy/HFrEF S/p ICD  initially presented with elevated BNP and pulmonary edema with pleural effusion on CT chest.  Diuresed with IV Lasix with improvement.  Echo prior to  admission showed LVEF of 30 to 35%.  --Continue Coreg 3.125 mg twice daily, Jardiance 10 mg daily, valsartan 80 mg daily and Lasix 40 mg daily --Of note had been on Entresto, but cost prohibitive now (recently switched to valsartan at recent office visit)  CAD status post CABG (LIMA to LAD, SVG to diagonal, SVG to ramus intermedius, SVG to RCA)'19: Presented with fatigue and dyspnea similar to his previous anginal equivalent.  High-sensitivity troponin 22>> 27.  Underwent cardiac catheterization noted above with successful PCI/DES to ramus intermedius which was positive via FFR.  Recommendations for DAPT with aspirin/Plavix for at least 6 months.  No recurrent chest pain, seen by cardiac rehab. --Continue aspirin, Plavix, beta-blocker, valsartan  Hyperlipidemia: Statin intolerant --Continue Repatha  Hypertension: Stable --Continue Coreg 3.125 mg twice daily as well as valsartan 80 mg daily  General: Well developed, well nourished, male appearing in no acute distress. Head: Normocephalic, atraumatic.  Neck: Supple without bruits, JVD. Lungs:  Resp regular and unlabored, CTA.  Heart: RRR, S1, S2, no S3, S4, or murmur; no rub. Abdomen: Soft, non-tender, non-distended with normoactive bowel sounds. No hepatomegaly. No rebound/guarding. No obvious abdominal masses. Extremities: No clubbing, cyanosis, edema. Distal pedal pulses are 2+ bilaterally. Right cath site stable without bruising or hematoma Neuro: Alert and oriented X 3. Moves all extremities spontaneously. Psych: Normal affect.  Patient was seen by Dr. Angelena Form and deemed stable for discharge home. Follow up in the office arranged. Medication sent to pharmacy of choice.   Did the patient have an acute coronary syndrome (MI, NSTEMI, STEMI, etc) this admission?:  Yes                               AHA/ACC Clinical Performance & Quality Measures: Aspirin prescribed? - Yes ADP Receptor Inhibitor (Plavix/Clopidogrel, Brilinta/Ticagrelor or  Effient/Prasugrel) prescribed (includes medically managed patients)? - Yes Beta Blocker prescribed? - Yes High Intensity Statin (Lipitor 40-80mg  or Crestor 20-40mg ) prescribed? - No - On Repatha, statin intolerant EF assessed during THIS hospitalization? - No - recent outpatient evaluation For EF <40%, was ACEI/ARB prescribed? - Yes For EF <40%, Aldosterone Antagonist (Spironolactone or Eplerenone) prescribed? - No, patient refused Cardiac Rehab Phase II ordered (including medically managed patients)? - Yes       The patient will be scheduled for a TOC follow up appointment in 10-14 days.  A message has been sent to the [facility] and Scheduling Pool at the office where the patient should be seen for follow up.  _____________  Discharge Vitals Blood pressure (!) 157/81, pulse 81, temperature 98.4 F (36.9 C), temperature source Oral, resp. rate 15, height 6\' 1"  (1.854 m), weight 100.3 kg, SpO2 98 %.  Filed Weights   06/23/21 1357 06/25/21 0530  Weight: 105 kg 100.3 kg    Labs & Radiologic Studies    CBC Recent Labs    06/23/21 1408 06/24/21 0151 06/24/21 1548 06/24/21 1559 06/25/21 0127  WBC 8.4 7.2  --   --  7.0  NEUTROABS 6.5  --   --   --   --   HGB 15.0 14.5   < > 15.6 14.3  HCT 45.0 44.7   < > 46.0 44.8  MCV 95.1 96.1  --   --  95.5  PLT 209 200  --   --  199   < > = values in this interval not displayed.   Basic Metabolic Panel Recent Labs    06/24/21 0151 06/24/21 1548 06/24/21 1559 06/25/21 0127  NA 140   < > 139 139  K 4.2   < > 4.1 4.8  CL 100  --   --  104  CO2 32  --   --  30  GLUCOSE 93  --   --  70  BUN 12  --   --  14  CREATININE 1.22  --   --  1.00  CALCIUM 9.1  --   --  9.0   < > = values in this interval not displayed.   Liver Function Tests Recent Labs    06/23/21 1408  AST 25  ALT 18  ALKPHOS 50  BILITOT 1.5*  PROT 6.4*  ALBUMIN 3.7   No results for input(s): "LIPASE", "AMYLASE" in the last 72 hours. High Sensitivity Troponin:    Recent Labs  Lab 06/23/21 1408 06/23/21 1615  TROPONINIHS 22* 27*    BNP Invalid input(s): "POCBNP" D-Dimer Recent Labs  06/23/21 1408  DDIMER 1.56*   Hemoglobin A1C No results for input(s): "HGBA1C" in the last 72 hours. Fasting Lipid Panel No results for input(s): "CHOL", "HDL", "LDLCALC", "TRIG", "CHOLHDL", "LDLDIRECT" in the last 72 hours. Thyroid Function Tests No results for input(s): "TSH", "T4TOTAL", "T3FREE", "THYROIDAB" in the last 72 hours.  Invalid input(s): "FREET3" _____________  CARDIAC CATHETERIZATION  Result Date: 06/24/2021   -------------- NATIVE CORONARIES------------   Prox RCA lesion is 90% stenosed.  Dist RCA lesion is 90% stenosed.   Ost LM to Mid LM lesion is 40% stenosed.   Prox LAD lesion is 100% stenosed prior to D1. Prox LAD to Mid LAD lesion is 100% stenosed following D1.   CULPRIT LESION: Ramus lesion is 75% stenosed.  (Borderline positive by RFR (0.91) and FFR 0.80) -> given the patient's symptoms, this is felt to be significant.   A drug-eluting stent was successfully placed using a SYNERGY XD 3.0X16 -> deployed to 3.3 mm.   Post intervention, there is a 0% residual stenosis.   -------------- GRAFTS -------------------------------   LIMA-LAD graft was visualized by angiography and is normal in caliber. The graft exhibits no disease. There is no competitive flow   SVG-D1 graft was visualized by angiography and is normal in caliber. The graft exhibits no disease, and the flow is not reversed.  There is retrograde filling of a major septal perforator trunk in the LAD that it would otherwise not be perfused by the native LAD or retrograde filling from the LIMA..   SVG-rPDA graft was visualized by angiography and is moderate in size.  The graft exhibits no disease. There is trivial competitive flow   SVG-RI graft was visualized by angiography, Origin lesion is 100% stenosed.   -------------- HEMODYNAMICS-----------------   LV end diastolic pressure is normal.    There is no aortic valve stenosis. POST-OPERATIVE DIAGNOSIS:  Stable Three-Vessel CAD: Ostial mid OM 40%.  Proximal to mid LAD 100%.  Proximal RCA 90% at bend with trace filling of the PDA and PL.  Patent LCx that is small and nondominant. Culprit lesion: Progression of proximal Ramus Intermedius (RI) lesion from 60% to 70-80% -> RFR 0.90, 0.91 with a FFR of 0.80.  Based on his symptoms thought to be significant. Successful DES PCI of ostial and proximal L RI using Synergy DES 3.0 mm x 16 mm deployed to 3.3 mm. Reducing lesion from 70-80% to 0% TIMI-3 flow pre and post. Known occluded SVG-RI with otherwise patent SVG-PDA, SVG-D1, and LIMA-LAD RHC pressures relatively normal: RAP 2 mmHg, RVP-EDP 28/0-0 mmHg; PAP-mean 19/10-15 mmHg, PCWP 16 mmHg; LVP-EDP 120/1-12 mmHg, AoP-MAP 116/54 mmHg - 72 mmHg; Ao sat 98%, PA sat 69% Cardiac Output-Index: Fick 5.32-2.33; TD 3.21-1.4 (suggestive of valvular disease. PLAN OF CARE:  Return to nursing floor for post PCI care after sheath removal. ->  Dual and platelet therapy for 6 months, then okay to stop aspirin.  Seems relatively euvolemic on right heart cath. Glenetta Hew, MD  VAS [place] LOWER EXTREMITY VENOUS (DVT) (ONLY [facility])  Result Date: 06/23/2021  Lower Venous DVT Study Patient Name:  [name]  Date of Exam:   06/23/2021 Medical Rec #: [id number]      Accession #:    [id number] Date of Birth: 10-07-1955      Patient Gender: M Patient Age:   32 years Exam Location:  [facility] Procedure:      VAS [place] LOWER EXTREMITY VENOUS (DVT) Referring Phys: Gareth Morgan --------------------------------------------------------------------------------  Indications: Swelling,  right>left, short of breath.  Comparison Study: No prior studies. Performing Technologist: Jean Rosenthal RDMS, RVT  Examination Guidelines: A complete evaluation includes B-mode imaging, spectral Doppler, color Doppler, and power Doppler as needed of all accessible portions of each vessel. Bilateral  testing is considered an integral part of a complete examination. Limited examinations for reoccurring indications may be performed as noted. The reflux portion of the exam is performed with the patient in reverse Trendelenburg.  +---------+---------------+---------+-----------+---------------+--------------+ RIGHT    CompressibilityPhasicitySpontaneityProperties     Thrombus Aging +---------+---------------+---------+-----------+---------------+--------------+ CFV      Full           Yes      Yes                                      +---------+---------------+---------+-----------+---------------+--------------+ SFJ      Full                                                             +---------+---------------+---------+-----------+---------------+--------------+ FV Prox  Full                                                             +---------+---------------+---------+-----------+---------------+--------------+ FV Mid   Full                                                             +---------+---------------+---------+-----------+---------------+--------------+ FV DistalFull                                                             +---------+---------------+---------+-----------+---------------+--------------+ PFV      Full                                                             +---------+---------------+---------+-----------+---------------+--------------+ POP      Full           Yes      Yes                                      +---------+---------------+---------+-----------+---------------+--------------+ PTV      Full                                                             +---------+---------------+---------+-----------+---------------+--------------+  PERO     Full                                                             +---------+---------------+---------+-----------+---------------+--------------+ Gastroc  Full            Yes      Yes        Prolonged flow                                                            reversal noted                                                            in the GSV at                                                             patient's area                                                            of concern                    +---------+---------------+---------+-----------+---------------+--------------+   +---------+---------------+---------+-----------+----------+--------------+ LEFT     CompressibilityPhasicitySpontaneityPropertiesThrombus Aging +---------+---------------+---------+-----------+----------+--------------+ CFV      Full           Yes      Yes                                 +---------+---------------+---------+-----------+----------+--------------+ SFJ      Full                                                        +---------+---------------+---------+-----------+----------+--------------+ FV Prox  Full                                                        +---------+---------------+---------+-----------+----------+--------------+ FV Mid   Full                                                        +---------+---------------+---------+-----------+----------+--------------+  FV DistalFull                                                        +---------+---------------+---------+-----------+----------+--------------+ PFV      Full                                                        +---------+---------------+---------+-----------+----------+--------------+ POP      Full           Yes      Yes                                 +---------+---------------+---------+-----------+----------+--------------+ PTV      Full                                                        +---------+---------------+---------+-----------+----------+--------------+ PERO     Full                                                         +---------+---------------+---------+-----------+----------+--------------+ Gastroc  Full                                                        +---------+---------------+---------+-----------+----------+--------------+     Summary: RIGHT: - There is no evidence of deep vein thrombosis in the lower extremity.  - No cystic structure found in the popliteal fossa.  - Incidental: Prolonged flow reversal noted in the distal greater saphenous vein at patient's area of concern/palpation.  LEFT: - There is no evidence of deep vein thrombosis in the lower extremity.  - No cystic structure found in the popliteal fossa.  *See table(s) above for measurements and observations. Electronically signed by Monica Martinez MD on 06/23/2021 at 7:46:30 PM.    Final    CT Angio Chest PE W and/or Wo Contrast  Result Date: 06/23/2021 CLINICAL DATA:  Short of breath. History of previous myocardial infarctions and CABG surgery. No current chest pain. EXAM: CT ANGIOGRAPHY CHEST WITH CONTRAST TECHNIQUE: Multidetector CT imaging of the chest was performed using the standard protocol during bolus administration of intravenous contrast. Multiplanar CT image reconstructions and MIPs were obtained to evaluate the vascular anatomy. RADIATION DOSE REDUCTION: This exam was performed according to the departmental dose-optimization program which includes automated exposure control, adjustment of the mA and/or kV according to patient size and/or use of iterative reconstruction technique. CONTRAST:  67mL OMNIPAQUE IOHEXOL 350 MG/ML SOLN COMPARISON:  CT a chest, 05/15/2021. FINDINGS: Cardiovascular: Pulmonary arteries are well opacified. There is no evidence of a pulmonary embolism. Heart is mildly enlarged. No pericardial effusion. Coronary  artery calcifications and changes from prior CABG surgery. There are surgical clips along the ascending aorta. Ascending aorta is dilated to a maximum 4.3 cm. This  is stable from the prior study a allowing for differences in measurement technique. Mediastinum/Nodes: No neck base, mediastinal or hilar masses or enlarged lymph nodes. Trachea and esophagus are unremarkable. Lungs/Pleura: Small left effusion. There is dependent opacity in the left lower lobe consistent with atelectasis, similar to the prior study. Bilateral interstitial thickening. There are ground-glass opacities bilaterally, most evident in the posterior right upper lobe, new since the prior CT. Right pleural effusion seen previously has resolved. Stable centrilobular emphysema.  No pneumothorax. Upper Abdomen: No acute abnormality. Musculoskeletal: No fracture or acute finding. No bone lesion. No chest wall mass. Review of the MIP images confirms the above findings. IMPRESSION: 1. No evidence of a pulmonary embolism. 2. New bilateral ground-glass lung opacities. Differential diagnosis includes multifocal pneumonia and asymmetric pulmonary edema. Associated interstitial thickening supports asymmetric pulmonary edema. 3. Small left pleural effusion, unchanged from the prior CT. There is enhancement along the parietal and visceral pleura surrounding the effusion. Consider an empyema if there are consistent clinical findings. There is associated dependent left lower lobe opacity adjacent to this pleural effusion, also stable and consistent with rounded atelectasis. 4. Dilated ascending thoracic aorta to 4.3 cm, unchanged from the recent prior exam. Recommend annual imaging followup by CTA or MRA. This recommendation follows 2010 ACCF/AHA/AATS/ACR/ASA/SCA/SCAI/SIR/STS/SVM Guidelines for the Diagnosis and Management of Patients with Thoracic Aortic Disease. Circulation. 2010; [id number]. Aortic aneurysm NOS (ICD10-I71.9) 5. Coronary artery calcifications and aortic atherosclerosis. Aortic Atherosclerosis (ICD10-I70.0) and Emphysema (ICD10-J43.9). Electronically Signed   By: Lajean Manes M.D.   On: 06/23/2021  17:01   DG Chest Portable 1 View  Result Date: 06/23/2021 CLINICAL DATA:  Shortness of breath EXAM: PORTABLE CHEST 1 VIEW COMPARISON:  May 15, 2021 FINDINGS: The heart size and mediastinal contours are stable. The heart size is enlarged. Cardiac pacemaker and pacer leads are unchanged. Mild increased pulmonary interstitium is identified bilaterally. Small left pleural effusion is identified. Patchy opacity of the medial left lung base is noted. The visualized skeletal structures are stable. IMPRESSION: 1. Mild congestive heart failure. 2. Small left pleural effusion. 3. Patchy opacity of the medial left lung base, superimposed pneumonia is not excluded. Electronically Signed   By: Abelardo Diesel M.D.   On: 06/23/2021 14:29   ECHOCARDIOGRAM LIMITED  Result Date: 06/16/2021    ECHOCARDIOGRAM LIMITED REPORT   Patient Name:   [name] Date of Exam: 06/16/2021 Medical Rec #:  [id number]     Height:       74.0 in Accession #:    [id number]    Weight:       232.0 lb Date of Birth:  10-Jul-1955     BSA:          2.315 m Patient Age:    7 years      BP:           136/87 mmHg Patient Gender: M             HR:           93 bpm. Exam Location:  [address] Procedure: Limited Echo, Cardiac Doppler, Limited Color Doppler and 3D Echo Indications:    M79.89 leg swelling  History:        Patient has prior history of Echocardiogram examinations, most  recent 08/30/2020. CHF, Previous Myocardial Infarction and CAD,                 Prior CABG and AICD, Arrythmias:LBBB; Risk Factors:Hypertension                 and HLD.  Sonographer:    Marygrace Drought RCS Referring Phys: [phone] HAO MENG IMPRESSIONS  1. Left ventricular ejection fraction, by estimation, is 30 to 35%. Left ventricular ejection fraction by 3D volume is 31 %. The left ventricle has moderately decreased function. The left ventricle demonstrates global hypokinesis. The left ventricular internal cavity size was moderately dilated. Left ventricular  diastolic parameters are consistent with Grade I diastolic dysfunction (impaired relaxation).  2. Right ventricular systolic function is mildly reduced. The right ventricular size is normal. There is normal pulmonary artery systolic pressure. The estimated right ventricular systolic pressure is [id number] mmHg.  3. Left atrial size was mild to moderately dilated.  4. The mitral valve is grossly normal. Mild mitral valve regurgitation. No evidence of mitral stenosis.  5. The aortic valve is tricuspid. Aortic valve regurgitation is mild. No aortic stenosis is present.  6. There is mild dilatation of the ascending aorta, measuring 41 mm.  7. The inferior vena cava is normal in size with greater than 50% respiratory variability, suggesting right atrial pressure of 3 mmHg. Comparison(s): Changes from prior study are noted. The left ventricular function is worsened. FINDINGS  Left Ventricle: Left ventricular ejection fraction, by estimation, is 30 to 35%. Left ventricular ejection fraction by 3D volume is 31 %. The left ventricle has moderately decreased function. The left ventricle demonstrates global hypokinesis. The left ventricular internal cavity size was moderately dilated. Abnormal (paradoxical) septal motion, consistent with left bundle branch block. Left ventricular diastolic parameters are consistent with Grade I diastolic dysfunction (impaired relaxation). Right Ventricle: The right ventricular size is normal. No increase in right ventricular wall thickness. Right ventricular systolic function is mildly reduced. There is normal pulmonary artery systolic pressure. The tricuspid regurgitant velocity is 2.75 m/s, and with an assumed right atrial pressure of 3 mmHg, the estimated right ventricular systolic pressure is [id number] mmHg. Left Atrium: Left atrial size was mild to moderately dilated. Right Atrium: Right atrial size was normal in size. Pericardium: There is no evidence of pericardial effusion. Mitral Valve: The  mitral valve is grossly normal. Mild mitral valve regurgitation. No evidence of mitral valve stenosis. Tricuspid Valve: The tricuspid valve is grossly normal. Tricuspid valve regurgitation is mild . No evidence of tricuspid stenosis. Aortic Valve: The aortic valve is tricuspid. Aortic valve regurgitation is mild. Aortic regurgitation PHT measures 268 msec. No aortic stenosis is present. Aorta: The aortic root is normal in size and structure. There is mild dilatation of the ascending aorta, measuring 41 mm. Venous: The inferior vena cava is normal in size with greater than 50% respiratory variability, suggesting right atrial pressure of 3 mmHg. Additional Comments: A device lead is visualized in the right atrium and right ventricle. LEFT VENTRICLE PLAX 2D LVIDd:         6.50 cm LVIDs:         5.50 cm LV PW:         0.90 cm         3D Volume EF LV IVS:        0.90 cm         LV 3D EF:    Left LVOT diam:     2.30 cm  ventricul LVOT Area:     4.15 cm                     ar                                             ejection                                             fraction                                             by 3D                                             volume is                                             31 %.                                 3D Volume EF:                                3D EF:        31 %                                LV EDV:       259 ml                                LV ESV:       178 ml                                LV SV:        81 ml RIGHT VENTRICLE RVSP:           33.2 mmHg LEFT ATRIUM         Index       RIGHT ATRIUM LA diam:    4.80 cm 2.07 cm/m  RA Pressure: 3.00 mmHg  AORTIC VALVE AI PHT:      268 msec  AORTA Ao Root diam: 3.40 cm Ao Asc diam:  4.10 cm MITRAL VALVE                TRICUSPID VALVE MV Area (PHT):              TR Peak grad:   30.2 mmHg MV Decel Time:              TR Vmax:        275.00  cm/s MV E velocity: 89.60 cm/s   Estimated RAP:  3.00  mmHg MV A velocity: 135.00 cm/s  RVSP:           33.2 mmHg MV E/A ratio:  0.66                             SHUNTS                             Systemic Diam: 2.30 cm Eleonore Chiquito MD Electronically signed by Eleonore Chiquito MD Signature Date/Time: 06/16/2021/10:57:08 AM    Final     Disposition   Pt is being discharged home today in good condition.  Follow-up Plans & Appointments     Follow-up Information     Lenna Sciara, NP Follow up on 07/04/2021.   Specialties: Nurse Practitioner, Family Medicine Why: at 8:25am for your follow up appt Contact information: [address] [place] [address] [phone]                Discharge Instructions     Amb Referral to Cardiac Rehabilitation   Complete by: As directed    Diagnosis:  Coronary Stents PTCA     After initial evaluation and assessments completed: Virtual Based Care may be provided alone or in conjunction with Phase 2 Cardiac Rehab based on patient barriers.: Yes   Call MD for:  difficulty breathing, headache or visual disturbances   Complete by: As directed    Call MD for:  persistant dizziness or light-headedness   Complete by: As directed    Call MD for:  redness, tenderness, or signs of infection (pain, swelling, redness, odor or green/yellow discharge around incision site)   Complete by: As directed    Diet - low sodium heart healthy   Complete by: As directed    Discharge instructions   Complete by: As directed    Groin Site Care Refer to this sheet in the next few weeks. These instructions provide you with information on caring for yourself after your procedure. Your caregiver may also give you more specific instructions. Your treatment has been planned according to current medical practices, but problems sometimes occur. Call your caregiver if you have any problems or questions after your procedure. HOME CARE INSTRUCTIONS You may shower 24 hours after the procedure. Remove the bandage  (dressing) and gently wash the site with plain soap and water. Gently pat the site dry.  Do not apply powder or lotion to the site.  Do not sit in a bathtub, swimming pool, or whirlpool for 5 to 7 days.  No bending, squatting, or lifting anything over 10 pounds (4.5 kg) as directed by your caregiver.  Inspect the site at least twice daily.  Do not drive home if you are discharged the same day of the procedure. Have someone else drive you.  You may drive 24 hours after the procedure unless otherwise instructed by your caregiver.  What to expect: Any bruising will usually fade within 1 to 2 weeks.  Blood that collects in the tissue (hematoma) may be painful to the touch. It should usually decrease in size and tenderness within 1 to 2 weeks.  SEEK IMMEDIATE MEDICAL CARE IF: You have unusual pain at the groin site or down the affected leg.  You have redness, warmth, swelling, or pain at the groin site.  You have drainage (other than  a small amount of blood on the dressing).  You have chills.  You have a fever or persistent symptoms for more than 72 hours.  You have a fever and your symptoms suddenly get worse.  Your leg becomes pale, cool, tingly, or numb.  You have heavy bleeding from the site. Hold pressure on the site. Marland Kitchen  PLEASE DO NOT MISS ANY DOSES OF YOUR PLAVIX!!!!! Also keep a log of you blood pressures and bring back to your follow up appt. Please call the office with any questions.   Patients taking blood thinners should generally stay away from medicines like ibuprofen, Advil, Motrin, naproxen, and Aleve due to risk of stomach bleeding. You may take Tylenol as directed or talk to your primary doctor about alternatives.  Some studies suggest Prilosec/Omeprazole interacts with Plavix. We changed your Prilosec/Omeprazole to the equivalent dose of Protonix for less chance of interaction.  PLEASE ENSURE THAT YOU DO NOT RUN OUT OF YOUR PLAVIX. This medication is very important to remain  on for at least 73months. IF you have issues obtaining this medication due to cost please CALL the office 3-5 business days prior to running out in order to prevent missing doses of this medication.   Increase activity slowly   Complete by: As directed        Discharge Medications   Allergies as of 06/25/2021       Reactions   Ezetimibe Other (See Comments)   Joint pain and drops BP   Niacin Other (See Comments)   Drops BP and severe   Statins Other (See Comments)   Severe joint pain and drops BP.        Medication List     STOP taking these medications    esomeprazole 20 MG capsule Commonly known as: [place] these medications    acetaminophen 500 MG tablet Commonly known as: TYLENOL Take 1,000 mg by mouth every 4 (four) hours as needed for mild pain. Morning & noon   aspirin EC 81 MG tablet Take 81 mg by mouth in the morning.   carvedilol 6.25 MG tablet Commonly known as: COREG Take 0.5 tablets (3.125 mg total) by mouth 2 (two) times daily with a meal. Must have office visit for additional refills [phone]   clopidogrel 75 MG tablet Commonly known as: PLAVIX Take 1 tablet (75 mg total) by mouth daily with breakfast. Start taking on: June 26, 2021   dicyclomine 20 MG tablet Commonly known as: BENTYL Take 1 tablet (20 mg total) by mouth 2 (two) times daily.   furosemide 40 MG tablet Commonly known as: LASIX Take 1 tablet (40 mg total) by mouth daily.   Jardiance 10 MG Tabs tablet Generic drug: empagliflozin Take 10 mg by mouth daily.   nitroGLYCERIN 0.4 MG SL tablet Commonly known as: NITROSTAT Place 1 tablet (0.4 mg total) under the tongue every 5 (five) minutes x 3 doses as needed for chest pain.   pantoprazole 40 MG tablet Commonly known as: Protonix Take 1 tablet (40 mg total) by mouth daily.   Repatha SureClick [id number] MG/ML Soaj Generic drug: Evolocumab INJECT INTO SKIN EVERY 14 DAYS What changed: See the new instructions.    valsartan 80 MG tablet Commonly known as: DIOVAN Take 1 tablet (80 mg total) by mouth daily.   zolpidem 10 MG tablet Commonly known as: AMBIEN Take 1 tablet (10 mg total) by mouth at bedtime.  Outstanding Labs/Studies   N/a   Duration of Discharge Encounter   Greater than 30 minutes including physician time.  Signed, Reino Bellis, NP 06/25/2021, 11:40 AM   I have personally seen and examined this patient. I agree with the assessment and plan as outlined above.  Doing well post PCI of the intermediate branch. No chest pain today. Will discharge home today.   Lauree Chandler, MD, [facility] 06/25/2021 11:40 AM

## 2021-06-25 NOTE — Progress Notes (Signed)
Mobility Specialist Progress Note    06/25/21 1112  Mobility  Activity Ambulated independently in hallway  Level of Assistance Independent  Assistive Device None  Distance Ambulated (ft) 470 ft  Activity Response Tolerated well  $Mobility charge 1 Mobility   Pre-Mobility: 94 HR During Mobility: 100 HR Post-Mobility: 85 HR  Pt received standing EOB and agreeable. No complaints on walk. Returned to bed with call bell in reach.    [place] Mobility Specialist

## 2021-06-26 LAB — LIPOPROTEIN A (LPA): Lipoprotein (a): 106.4 nmol/L — ABNORMAL HIGH (ref <75.0)

## 2021-06-27 ENCOUNTER — Telehealth: Payer: Self-pay | Admitting: Cardiology

## 2021-06-27 NOTE — Telephone Encounter (Signed)
I contacted patient back, went to voicemail.  LVM to call back to discuss. Left call back number.

## 2021-06-30 DIAGNOSIS — M1711 Unilateral primary osteoarthritis, right knee: Secondary | ICD-10-CM | POA: Diagnosis not present

## 2021-06-30 DIAGNOSIS — M25561 Pain in right knee: Secondary | ICD-10-CM | POA: Diagnosis not present

## 2021-07-01 ENCOUNTER — Ambulatory Visit (HOSPITAL_COMMUNITY): Admit: 2021-07-01 | Payer: PPO | Admitting: Cardiology

## 2021-07-01 ENCOUNTER — Encounter (HOSPITAL_COMMUNITY): Payer: Self-pay

## 2021-07-01 SURGERY — LEFT HEART CATH AND CORS/GRAFTS ANGIOGRAPHY
Anesthesia: LOCAL

## 2021-07-04 ENCOUNTER — Other Ambulatory Visit: Payer: Self-pay | Admitting: Nurse Practitioner

## 2021-07-04 ENCOUNTER — Encounter: Payer: Self-pay | Admitting: Nurse Practitioner

## 2021-07-04 ENCOUNTER — Ambulatory Visit: Payer: PPO | Admitting: Nurse Practitioner

## 2021-07-04 VITALS — BP 128/80 | HR 75 | Ht 72.0 in | Wt 224.2 lb

## 2021-07-04 DIAGNOSIS — I25118 Atherosclerotic heart disease of native coronary artery with other forms of angina pectoris: Secondary | ICD-10-CM

## 2021-07-04 DIAGNOSIS — I5022 Chronic systolic (congestive) heart failure: Secondary | ICD-10-CM | POA: Diagnosis not present

## 2021-07-04 DIAGNOSIS — E785 Hyperlipidemia, unspecified: Secondary | ICD-10-CM | POA: Diagnosis not present

## 2021-07-04 DIAGNOSIS — R55 Syncope and collapse: Secondary | ICD-10-CM | POA: Diagnosis not present

## 2021-07-04 DIAGNOSIS — I739 Peripheral vascular disease, unspecified: Secondary | ICD-10-CM | POA: Diagnosis not present

## 2021-07-04 DIAGNOSIS — I10 Essential (primary) hypertension: Secondary | ICD-10-CM

## 2021-07-04 DIAGNOSIS — R001 Bradycardia, unspecified: Secondary | ICD-10-CM | POA: Diagnosis not present

## 2021-07-04 DIAGNOSIS — R0609 Other forms of dyspnea: Secondary | ICD-10-CM | POA: Diagnosis not present

## 2021-07-04 NOTE — Patient Instructions (Signed)
Medication Instructions:  Your physician recommends that you continue on your current medications as directed. Please refer to the Current Medication list given to you today.   *If you need a refill on your cardiac medications before your next appointment, please call your pharmacy*   Lab Work: NONE ordered at this time of appointment   If you have labs (blood work) drawn today and your tests are completely normal, you will receive your results only by: MyChart Message (if you have MyChart) OR A paper copy in the mail If you have any lab test that is abnormal or we need to change your treatment, we will call you to review the results.   Testing/Procedures: NONE ordered at this time of appointment     Follow-Up: At [facility], you and your health needs are our priority.  As part of our continuing mission to provide you with exceptional heart care, we have created designated Provider Care Teams.  These Care Teams include your primary Cardiologist (physician) and Advanced Practice Providers (APPs -  Physician Assistants and Nurse Practitioners) who all work together to provide you with the care you need, when you need it.  We recommend signing up for the patient portal called "MyChart".  Sign up information is provided on this After Visit Summary.  MyChart is used to connect with patients for Virtual Visits (Telemedicine).  Patients are able to view lab/test results, encounter notes, upcoming appointments, etc.  Non-urgent messages can be sent to your provider as well.   To learn more about what you can do with MyChart, go to [email or web address].    Your next appointment:   3 month(s)  The format for your next appointment:   In Person  Provider:   Peter [place], MD  or Bernadene Person, NP        Other Instructions   Important Information About Sugar

## 2021-07-04 NOTE — Progress Notes (Signed)
Office Visit    Patient Name: [name] Date of Encounter: 07/04/2021  Primary Care Provider:  Wardell Honour, MD Primary Cardiologist:  Peter Martinique, MD  Chief Complaint    66 year old male with a history of CAD s/p CABG, chronic systolic heart failure, ICM, s/p ICD LBBB, hypertension, hyperlipidemia, GERD, arthritis, and anxiety who presents for post hospital follow-up related to CAD and heart failure.  Past Medical History    Past Medical History:  Diagnosis Date   AICD (automatic cardioverter/defibrillator) present    Anxiety    Arthritis    "mild; back, neck, spine" (06/09/2017)   CHF (congestive heart failure) (HCC)    Chronic back pain    Coronary artery disease    Dyspnea    GERD (gastroesophageal reflux disease)    History of gout    History of kidney stones X 2   Hypercholesterolemia    Hypertension    LBBB (left bundle branch block)    MI (myocardial infarction) ([place]) 1993   LATERAL   MI (myocardial infarction) ([place]) ?09/2001; 06/07/2017   Pneumonia    "several times" (06/09/2017)   S/P coronary artery stent placement    RCA   Varicose veins    Past Surgical History:  Procedure Laterality Date   ANTERIOR CERVICAL DECOMP/DISCECTOMY FUSION  03/03/2011   Procedure: ANTERIOR CERVICAL DECOMPRESSION/DISCECTOMY FUSION 3 LEVELS;  Surgeon: Floyce Stakes, MD;  Location: [facility];  Service: Neurosurgery;  Laterality: N/A;  Cervical three-four Cervical four-five Cervical five-six Anterior cervical decompression/diskectomy, fusion   APPLICATION OF ROBOTIC ASSISTANCE FOR SPINAL PROCEDURE N/A 10/03/2020   Procedure: APPLICATION OF ROBOTIC ASSISTANCE FOR SPINAL PROCEDURE;  Surgeon: Consuella Lose, MD;  Location: [facility];  Service: Neurosurgery;  Laterality: N/A;   BACK SURGERY     CARDIAC CATHETERIZATION  2009   Stents in RCA patent. 70 to 80% PL, and 50% ostial PD.    CARDIAC CATHETERIZATION N/A 11/20/2014   Procedure: Left Heart Cath and Coronary  Angiography;  Surgeon: Peter M Martinique, MD;  Location: [facility] [facility];  Service: Cardiovascular;  Laterality: N/A;   CORONARY ANGIOPLASTY     DIRECT ANGIOPLASTY THE MARGINAL BRANCH   CORONARY ANGIOPLASTY WITH STENT PLACEMENT     RCA   CORONARY ARTERY BYPASS GRAFT N/A 06/14/2017   Procedure: CORONARY ARTERY BYPASS GRAFTING (CABG) x four, using left internal mammary artery and right leg greater saphenous vein harvested endoscopically;  Surgeon: Ivin Poot, MD;  Location: [facility];  Service: Open Heart Surgery;  Laterality: N/A;   CORONARY STENT INTERVENTION N/A 06/24/2021   Procedure: CORONARY STENT INTERVENTION;  Surgeon: Leonie Man, MD;  Location: [place] [facility];  Service: Cardiovascular;  Laterality: N/A;   CYSTOSCOPY N/A 06/14/2017   Procedure: CYSTOSCOPY;  Surgeon: Ivin Poot, MD;  Location: [facility];  Service: Open Heart Surgery;  Laterality: N/A;   ICD IMPLANT N/A 03/11/2018   Procedure: ICD IMPLANT - Dual Chamber;  Surgeon: Deboraha Sprang, MD;  Location: [facility] [facility];  Service: Cardiovascular;  Laterality: N/A;   INSERTION OF SUPRAPUBIC CATHETER N/A 06/14/2017   Procedure: INSERTION OF SUPRAPUBIC CATHETER - Lower abdomen;  Surgeon: Ivin Poot, MD;  Location: [facility];  Service: Open Heart Surgery;  Laterality: N/A;   INTRAVASCULAR PRESSURE WIRE/FFR STUDY N/A 06/24/2021   Procedure: INTRAVASCULAR PRESSURE WIRE/FFR STUDY;  Surgeon: Leonie Man, MD;  Location: [place] [facility];  Service: Cardiovascular;  Laterality: N/A;   POSTERIOR LUMBAR FUSION  10/2011   RIGHT/LEFT HEART CATH AND CORONARY ANGIOGRAPHY N/A 06/08/2017   Procedure: RIGHT/LEFT HEART CATH AND CORONARY ANGIOGRAPHY;  Surgeon: Martinique, Peter M, MD;  Location: [place] [facility];  Service: Cardiovascular;  Laterality: N/A;   RIGHT/LEFT HEART CATH AND CORONARY/GRAFT ANGIOGRAPHY N/A 09/06/2020   Procedure: RIGHT/LEFT HEART CATH AND CORONARY/GRAFT ANGIOGRAPHY;  Surgeon: Martinique, Peter M, MD;  Location: [facility];  Service: Cardiovascular;  Laterality: N/A;   RIGHT/LEFT HEART CATH AND CORONARY/GRAFT ANGIOGRAPHY N/A 06/24/2021   Procedure: RIGHT/LEFT HEART CATH AND CORONARY/GRAFT ANGIOGRAPHY;  Surgeon: Leonie Man, MD;  Location: [facility];  Service: Cardiovascular;  Laterality: N/A;   TEE WITHOUT CARDIOVERSION N/A 06/14/2017   Procedure: TRANSESOPHAGEAL ECHOCARDIOGRAM (TEE);  Surgeon: Prescott Gum, [name] Salina, MD;  Location: [facility] [place];  Service: Open Heart Surgery;  Laterality: N/A;   URETHROPLASTY N/A 10/15/2017   Procedure: URETHROPLASTY WITH BUCCAL GRAFT HARVAST;  Surgeon: Ardis Hughs, MD;  Location: [facility];  Service: Urology;  Laterality: N/A;    Allergies  Allergies  Allergen Reactions   Ezetimibe Other (See Comments)    Joint pain and drops BP   Niacin Other (See Comments)    Drops BP and severe   Statins Other (See Comments)    Severe joint pain and drops BP.    History of Present Illness    66 year old male with the above past medical history including CAD s/p CABG, chronic systolic heart failure, ICM, s/p ICD LBBB, hypertension, hyperlipidemia, GERD, arthritis, and anxiety.   He was hospitalized in June 2019 with flash pulmonary edema and ruled in for NSTEMI. Cardiac catheterization at the time showed severe three-vessel CAD.  He underwent CABG x 4 (LIMA-LAD, SVG-diagonal, SVG-ramus intermediate, and SVG-RCA).  He had a syncopal episode in June 2019.  He was noted to have wide-complex tachycardia on telemetry with heart rate in the 170s.  EF was 35 to 40%.  EP was consulted who felt underlying rhythm could be either V. tach or SVT.  He was fitted with a LifeVest.  Repeat echocardiogram in September 2019 showed persistently low EF of 25 to 30%.  He was unable to tolerate Entresto, spironolactone, or carvedilol due to orthostatic symptoms and lightheadedness.  He underwent biventricular ICD placement in March 2020 by Dr. Caryl Comes.  Repeat echocardiogram in July 2020 showed  EF improved to 40 to 45%.  Lexiscan Myoview in August 2022 in the setting of chest pain and increased dyspnea showed EF decreased to 17%, large inferolateral scar and anterior apical ischemia.  Follow-up echocardiogram showed EF 40%.  Follow-up cardiac catheterization showed occluded SVG to ramus however other grafts were patent.  He was evaluated in April 2023 for increased lower extremity edema. He was started on Lasix and Jardiance.  He was later evaluated in the ED in May 2023 acute shortness of breath and abdominal pain-he was positive for UTI.  Chest x-ray showed mild pulmonary edema with small left pleural effusion.  High sensitivity troponin was borderline. CT of the chest revealed 4.2 cm dilated ascending thoracic aorta chronic chronic atelectasis in the left lung base, chronic edema chest changes, distended gallbladder, and severe compression deformity of T12. RUQ Ultrasound revealed distended gallbladder, suggestive of cholelithiasis no findings to suggest acute cholecystitis.   He was last seen in the office in May 2023 and reported ongoing bilateral lower extremity edema. He reported stopping several medications including Jardiance, Lasix, and isosorbide dinitrate as he thought these could be contributing to his lower extremity edema.  Repeat limited echo showed EF 30 to 35%, slight decrease from prior echocardiogram, LV global hypokinesis, G1 DD, mildly reduced RV systolic function, normal PASP, mild mitral valve regurgitation, dilation of ascending aorta, 41 mm. Given the fact that cath in September 2022 showed no new targets for intervention, medical management was advised.  He was last seen in the office on 06/20/2021 ordered a myriad of concerns including fatigue, lightheadedness, intermittent low heart rate, dyspnea on exertion, and multiple episodes of presyncope. Additionally, he reported bilateral lower extremity edema, right lower leg pain, and intermittent cramping in his calves with  activity.  He was scheduled for outpatient cardiac catheterization for further evaluation.  Unfortunately, he presented to the ED on 06/23/2021 with complaints of shortness of breath.  He was hospitalized from 06/24/2018 to 06/25/2021 in the setting of acute heart failure exacerbation/CAD with anginal equivalent.  Troponin was mildly elevated. Chest x-ray showed mild CHF, small left pleural effusion. He received IV Lasix.  Lower extremity duplex was negative for DVT.  CTA of the chest was negative for PE.  He underwent cardiac catheterization on 06/24/2021 which revealed stable three-vessel CAD, known occluded SVG-RI, otherwise patent grafts, progression of proximal ramus intermedius lesion 60 to 70 to 80% with positive FFR s/p DES, RHC with relatively normal pressures. DAPT was recommended for 6 months with aspirin and Plavix followed by Plavix alone.  He was discharged home in stable condition on 06/25/2021. He called our office on 06/27/2021 with concern for elevated BP. Continuing monitoring was recommended and that he had recently undergone steroid injection to his knee.  He presents today for follow-up.  Since his last visit he has done well from a cardiac standpoint.  He he denies any symptoms concerning for angina.  He states that essentially all of his prior symptoms have resolved since his procedure.  He spent 9 hours working in his garden yesterday and felt well overall.  He has noted some labile BP at home since his procedure and since transitioning from [facility] to valsartan.  Additionally, he has opted taking his Jardiance due to cost concerns.  He has not heard anything back about his patient assistance forms for either Entresto or Jardiance.  Other than his intermittently elevated BP, he denies any additional concerns today.  Home Medications    Current Outpatient Medications  Medication Sig Dispense Refill   acetaminophen (TYLENOL) 500 MG tablet Take 1,000 mg by mouth every 4 (four) hours as needed  for mild pain. Morning & noon     aspirin 81 MG EC tablet Take 81 mg by mouth in the morning.     carvedilol (COREG) 6.25 MG tablet Take 0.5 tablets (3.125 mg total) by mouth 2 (two) times daily with a meal. Must have office visit for additional refills [phone] 60 tablet 0   clopidogrel (PLAVIX) 75 MG tablet Take 1 tablet (75 mg total) by mouth daily with breakfast. 30 tablet 5   dicyclomine (BENTYL) 20 MG tablet Take 1 tablet (20 mg total) by mouth 2 (two) times daily. 20 tablet 0   empagliflozin (JARDIANCE) 10 MG TABS tablet Take 10 mg by mouth daily.     furosemide (LASIX) 40 MG tablet Take 1 tablet (40 mg total) by mouth daily. 90 tablet 3   nitroGLYCERIN (NITROSTAT) 0.4 MG SL tablet Place 1 tablet (0.4 mg total) under the tongue every 5 (five) minutes x 3 doses as needed for chest pain. 25 tablet 2   pantoprazole (PROTONIX) 40 MG tablet Take 1  tablet (40 mg total) by mouth daily. 30 tablet 1   REPATHA SURECLICK 449 MG/ML SOAJ INJECT INTO SKIN EVERY 14 DAYS (Patient taking differently: Inject 140 mg into the muscle every 14 (fourteen) days.) 2 mL 11   valsartan (DIOVAN) 80 MG tablet Take 1 tablet (80 mg total) by mouth daily. 90 tablet 3   zolpidem (AMBIEN) 10 MG tablet Take 1 tablet (10 mg total) by mouth at bedtime. 30 tablet 3   No current facility-administered medications for this visit.     Review of Systems    He denies chest pain, palpitations, dyspnea, pnd, orthopnea, n, v, dizziness, syncope, edema, weight gain, or early satiety. All other systems reviewed and are otherwise negative except as noted above.     Cardiac Rehabilitation Eligibility Assessment  The patient is ready to start cardiac rehabilitation from a cardiac standpoint.    Physical Exam    VS:  BP 128/80 (BP Location: Right Arm, Patient Position: Sitting, Cuff Size: Normal)   Pulse 75   Ht 6' (1.829 m)   Wt 224 lb 3.2 oz (101.7 kg)   SpO2 94%   BMI 30.41 kg/m   GEN: Well nourished, well developed, in  no acute distress. HEENT: normal. Neck: Supple, no JVD, carotid bruits, or masses. Cardiac: RRR, no murmurs, rubs, or gallops. No clubbing, cyanosis, edema.  Radials/DP/PT 2+ and equal bilaterally.  Right groin cath site with minimal bruising, soft, no evidence of bleeding, hematoma, or bruit. Respiratory:  Respirations regular and unlabored, clear to auscultation bilaterally. GI: Soft, nontender, nondistended, BS + x 4. MS: no deformity or atrophy. Skin: warm and dry, no rash. Neuro:  Strength and sensation are intact. Psych: Normal affect.  Accessory Clinical Findings    ECG personally reviewed by me today -V paced, 75 bpm- no acute changes.  Lab Results  Component Value Date   WBC 7.0 06/25/2021   HGB 14.3 06/25/2021   HCT 44.8 06/25/2021   MCV 95.5 06/25/2021   PLT 199 06/25/2021   Lab Results  Component Value Date   CREATININE 1.00 06/25/2021   BUN 14 06/25/2021   NA 139 06/25/2021   K 4.8 06/25/2021   CL 104 06/25/2021   CO2 30 06/25/2021   Lab Results  Component Value Date   ALT 18 06/23/2021   AST 25 06/23/2021   ALKPHOS 50 06/23/2021   BILITOT 1.5 (H) 06/23/2021   Lab Results  Component Value Date   CHOL 153 08/30/2020   HDL 54 08/30/2020   LDLCALC 71 08/30/2020   TRIG 168 (H) 08/30/2020   CHOLHDL 2.8 08/30/2020    Lab Results  Component Value Date   HGBA1C 6.2 (H) 04/04/2021    Assessment & Plan    1. CAD/bradycardia/presyncope/dyspnea: S/p  CABG x 4 (LIMA-LAD, SVG-diagonal, SVG-ramus intermediate, and SVG-RCA) in 2019.  Cath in 09/2020 showed occluded SVG to ramus however other grafts were patent.  He had a recent fatigue, lightheadedness, intermittent low heart rate (50 bpm), dyspnea on exertion, and presyncope.   Repeat cath on 06/24/2021 revealed stable three-vessel CAD, known occluded SVG-RI, otherwise patent grafts, progression of proximal ramus intermedius lesion 60 to 70 to 80% with positive FFR s/p DES, RHC with relatively normal pressures.  DAPT  with aspirin and Plavix recommended for 6 months followed by Plavix alone.  Continue aspirin, Plavix, carvedilol, Lasix, valsartan, and Repatha.   2. Chronic systolic heart failure: S/p BIV ICD. Most recent limited echo showed EF 30 to 35%, slight decrease from prior  echocardiogram, LV global hypokinesis, G1 DD, mildly reduced RV systolic function, normal PASP, mild mitral valve regurgitation, dilation of ascending aorta, 41 mm.  Entresto was switched to valsartan due to cost concerns.  Additionally, he has stopped taking Jardiance due to inability to afford this medication.  Will follow-up on patient assistance.  Patient declines addition of spironolactone at this time.  Continue carvedilol, losartan, Lasix.   3. Hypertension: BP has been labile at home.  This has been the case since his recent cath and since stopping stone starting valsartan.  Discussed possible transition from valsartan to losartan, however, patient declines at this time.  He would like to continue to monitor BP and will notify us if BP remains elevated.  For now, continue current antihypertensive regimen.   4. Hyperlipidemia: LDL was 71 and August 2022.  Continue aspirin, Repatha.   5. Lower leg claudication/bilateral lower extremity edema/right lower leg pain: Since his catheterization and since his recent knee steroid injection, his symptoms have completely resolved.  Lower extremity duplex during recent hospitalization was negative for DVT.  He would like to cancel ABIs/lower extremity arterial duplex.  I advised him that should his symptoms return, we could consider further testing at that time.  Patient verbalized understanding.  6. Disposition: Follow-up  in 3 months.   Lenna Sciara, NP 07/04/2021, 9:02 AM

## 2021-07-07 ENCOUNTER — Other Ambulatory Visit: Payer: Self-pay | Admitting: Student

## 2021-07-07 ENCOUNTER — Other Ambulatory Visit: Payer: Self-pay | Admitting: Cardiology

## 2021-07-07 ENCOUNTER — Other Ambulatory Visit: Payer: Self-pay | Admitting: Medical

## 2021-07-07 ENCOUNTER — Other Ambulatory Visit (INDEPENDENT_AMBULATORY_CARE_PROVIDER_SITE_OTHER): Payer: PPO

## 2021-07-07 ENCOUNTER — Other Ambulatory Visit: Payer: Self-pay | Admitting: Physician Assistant

## 2021-07-07 DIAGNOSIS — I25118 Atherosclerotic heart disease of native coronary artery with other forms of angina pectoris: Secondary | ICD-10-CM | POA: Diagnosis not present

## 2021-07-07 DIAGNOSIS — F5101 Primary insomnia: Secondary | ICD-10-CM

## 2021-07-07 DIAGNOSIS — I255 Ischemic cardiomyopathy: Secondary | ICD-10-CM

## 2021-07-07 MED ORDER — ZOLPIDEM TARTRATE 10 MG PO TABS: 10.0000 mg | ORAL_TABLET | Freq: Every day | ORAL | 1 refills | Status: DC

## 2021-07-07 NOTE — Progress Notes (Signed)
I have logged into and reviewed this patient's PMPAware data today prior to issuing prescriptions for any controlled substances.  

## 2021-07-07 NOTE — Telephone Encounter (Signed)
This is Dr. Jordan's pt. °

## 2021-07-10 ENCOUNTER — Telehealth: Payer: PPO

## 2021-07-14 ENCOUNTER — Other Ambulatory Visit: Payer: Self-pay | Admitting: Specialist

## 2021-07-14 DIAGNOSIS — M25561 Pain in right knee: Secondary | ICD-10-CM

## 2021-07-16 ENCOUNTER — Ambulatory Visit (INDEPENDENT_AMBULATORY_CARE_PROVIDER_SITE_OTHER): Payer: PPO

## 2021-07-16 DIAGNOSIS — I255 Ischemic cardiomyopathy: Secondary | ICD-10-CM | POA: Diagnosis not present

## 2021-07-16 DIAGNOSIS — Z95810 Presence of automatic (implantable) cardiac defibrillator: Secondary | ICD-10-CM

## 2021-07-16 DIAGNOSIS — I5023 Acute on chronic systolic (congestive) heart failure: Secondary | ICD-10-CM

## 2021-07-16 LAB — CUP PACEART REMOTE DEVICE CHECK
Battery Remaining Longevity: 15 mo
Battery Voltage: 2.91 V
Brady Statistic AP VP Percent: 0.22 %
Brady Statistic AP VS Percent: 0.06 %
Brady Statistic AS VP Percent: 97.32 %
Brady Statistic AS VS Percent: 2.4 %
Brady Statistic RA Percent Paced: 0.28 %
Brady Statistic RV Percent Paced: 94.43 %
Date Time Interrogation Session: 20230712001704
HighPow Impedance: 78 Ohm
Implantable Lead Implant Date: 20190610
Implantable Lead Implant Date: 20200306
Implantable Lead Implant Date: 20200306
Implantable Lead Location: 753858
Implantable Lead Location: 753859
Implantable Lead Location: 753860
Implantable Lead Model: 5071
Implantable Lead Model: 5076
Implantable Pulse Generator Implant Date: 20200306
Lead Channel Impedance Value: 323 Ohm
Lead Channel Impedance Value: 399 Ohm
Lead Channel Impedance Value: 4047 Ohm
Lead Channel Impedance Value: 4047 Ohm
Lead Channel Impedance Value: 456 Ohm
Lead Channel Impedance Value: 494 Ohm
Lead Channel Pacing Threshold Amplitude: 0.5 V
Lead Channel Pacing Threshold Amplitude: 0.75 V
Lead Channel Pacing Threshold Amplitude: 2 V
Lead Channel Pacing Threshold Pulse Width: 0.4 ms
Lead Channel Pacing Threshold Pulse Width: 0.4 ms
Lead Channel Pacing Threshold Pulse Width: 1 ms
Lead Channel Sensing Intrinsic Amplitude: 3.5 mV
Lead Channel Sensing Intrinsic Amplitude: 3.5 mV
Lead Channel Sensing Intrinsic Amplitude: 31.625 mV
Lead Channel Sensing Intrinsic Amplitude: 31.625 mV
Lead Channel Setting Pacing Amplitude: 1.5 V
Lead Channel Setting Pacing Amplitude: 2.5 V
Lead Channel Setting Pacing Amplitude: 3 V
Lead Channel Setting Pacing Pulse Width: 0.4 ms
Lead Channel Setting Pacing Pulse Width: 1 ms
Lead Channel Setting Sensing Sensitivity: 0.6 mV

## 2021-07-25 ENCOUNTER — Ambulatory Visit
Admission: RE | Admit: 2021-07-25 | Discharge: 2021-07-25 | Disposition: A | Discharge status: Home / Self Care | Payer: PPO | Source: Ambulatory Visit | Attending: Specialist | Admitting: Specialist

## 2021-07-25 DIAGNOSIS — M25761 Osteophyte, right knee: Secondary | ICD-10-CM | POA: Diagnosis not present

## 2021-07-25 DIAGNOSIS — M25561 Pain in right knee: Secondary | ICD-10-CM

## 2021-07-25 DIAGNOSIS — G8929 Other chronic pain: Secondary | ICD-10-CM | POA: Diagnosis not present

## 2021-07-25 DIAGNOSIS — M1711 Unilateral primary osteoarthritis, right knee: Secondary | ICD-10-CM | POA: Diagnosis not present

## 2021-08-04 ENCOUNTER — Other Ambulatory Visit: Payer: Self-pay | Admitting: Physician Assistant

## 2021-08-04 ENCOUNTER — Other Ambulatory Visit: Payer: Self-pay | Admitting: Student

## 2021-08-04 DIAGNOSIS — M1711 Unilateral primary osteoarthritis, right knee: Secondary | ICD-10-CM | POA: Diagnosis not present

## 2021-08-04 DIAGNOSIS — I255 Ischemic cardiomyopathy: Secondary | ICD-10-CM

## 2021-08-04 NOTE — Progress Notes (Signed)
Remote ICD transmission.   

## 2021-08-05 ENCOUNTER — Telehealth: Payer: Self-pay | Admitting: *Deleted

## 2021-08-05 NOTE — Telephone Encounter (Signed)
   Pre-operative Risk Assessment    Patient Name: [name]  DOB: [birth year] MRN: [id number]      Request for Surgical Clearance    Procedure:   RIGHT TOTAL KNEE ARTHROPLASTY  Date of Surgery:  Clearance TBD                                 Surgeon:  DR. Valma Cava Surgeon's Group or Practice Name:  [facility] Phone number:  [phone] Fax number:  [phone] ATTN: KERRI MAZE   Type of Clearance Requested:   - Medical  - Pharmacy:  Hold Aspirin and Clopidogrel (Plavix)     Type of Anesthesia:  Spinal   Additional requests/questions:    [name]   08/05/2021, 12:13 PM

## 2021-08-05 NOTE — Telephone Encounter (Signed)
    Primary Cardiologist:Peter [place]az[place], MD  Chart reviewed as part of pre-operative protocol coverage. Because of [name] past medical history and time of recent PCI.  He is not eligible to hold Plavix or aspirin until after December per Dr. Herbie Baltimore.  Pre-op covering staff:  - Please contact requesting surgeon's office via preferred method (i.e, phone, fax) to inform them of need to delay of procedure due to inability to hold DAPT until December.   Napoleon Form, Leodis Rains, NP  08/05/2021, 12:37 PM

## 2021-08-11 ENCOUNTER — Telehealth (HOSPITAL_COMMUNITY): Payer: Self-pay

## 2021-08-11 NOTE — Telephone Encounter (Signed)
Pt stated that he cant afford his copay for the cardiac rehab program. Closed referral.Pt stated that he cant afford his copay for the cardiac rehab program. Closed referral.

## 2021-08-12 ENCOUNTER — Other Ambulatory Visit: Payer: Self-pay | Admitting: Cardiology

## 2021-08-18 ENCOUNTER — Ambulatory Visit: Payer: Self-pay

## 2021-08-18 NOTE — Patient Outreach (Signed)
  Care Coordination   08/18/2021 Name: [name] MRN: [id number] DOB: 10/09/1955   Care Coordination Outreach Attempts:  An unsuccessful telephone outreach was attempted today to offer the patient information about available care coordination services as a benefit of their health plan.   Follow Up Plan:  Additional outreach attempts will be made to offer the patient care coordination information and services.   Encounter Outcome:  No Answer  Care Coordination Interventions Activated:  No   Care Coordination Interventions:  No, not indicated    Bevelyn Ngo, BSW, CDP Social Worker, Certified Dementia Practitioner Care Coordination [phone]

## 2021-08-25 ENCOUNTER — Ambulatory Visit: Payer: Self-pay

## 2021-08-25 NOTE — Patient Outreach (Signed)
  Care Coordination   08/25/2021 Name: [name] MRN: [id number] DOB: [birth year]   Care Coordination Outreach Attempts:  A second unsuccessful outreach was attempted today to offer the patient with information about available care coordination services as a benefit of their health plan.     Follow Up Plan:  Additional outreach attempts will be made to offer the patient care coordination information and services.   Encounter Outcome:  No Answer  Care Coordination Interventions Activated:  No   Care Coordination Interventions:  No, not indicated    Bevelyn Ngo, BSW, CDP Social Worker, Certified Dementia Practitioner Care Coordination [phone]

## 2021-08-26 ENCOUNTER — Telehealth: Payer: Self-pay | Admitting: Cardiology

## 2021-08-26 NOTE — Telephone Encounter (Signed)
Preoperative team, please resend recommendations for holding off on surgery until after December to the requesting office and contact patient.  Thank you.  Thomasene Ripple. Briah Nary NP-C     08/26/2021, 11:40 AM [facility] [address] Office [phone] Fax [phone]

## 2021-08-26 NOTE — Telephone Encounter (Signed)
Follow Up:     Patient is calling to see what is the status of his clearance. He said the surgeon said they had not heard anything.

## 2021-08-26 NOTE — Telephone Encounter (Signed)
I have spoke with patient to inform him of the recommendations per pre-op team that he will need to hold off on surgery until December. Patient verbalized understanding and thanked me for the call.  I have resent over cardiac clearance recommendations to requesting surgeon's office.

## 2021-09-01 ENCOUNTER — Telehealth: Payer: Self-pay

## 2021-09-01 ENCOUNTER — Other Ambulatory Visit: Payer: Self-pay | Admitting: Physician Assistant

## 2021-09-01 ENCOUNTER — Ambulatory Visit: Payer: Self-pay

## 2021-09-01 ENCOUNTER — Other Ambulatory Visit: Payer: Self-pay | Admitting: Family Medicine

## 2021-09-01 NOTE — Patient Outreach (Signed)
  Care Coordination   Initial Visit Note   09/01/2021 Name: [name] MRN: [id number] DOB: 03-Jan-1956  [name] is a 66 y.o. year old male who sees Frederica Kuster, MD for primary care. I spoke with  [name] by phone today.  What matters to the patients health and wellness today?  I can't afford my medication or food due to medical bills    Goals Addressed             This Visit's Progress    Care Coordination Activities       Care Coordination Interventions: SDoH screening performed - identified patient has difficulty affording food due to high medical bills Mailed patient information on food pantry sites in order to assist with food insecurity Reviewed medications to determine patient is not taking Jardiance due to inability to afford medication Discussed opportunity to refer patient to [facility] pharmacy team to assist with patient assistance - patient agreeable Reviewed role of care coordination team - patient not interested in a call with a nurse case manager at this time. SW will follow up over the next month to confirm receipt of mailed resource Referral placed to [facility] pharmacy team        SDOH assessments and interventions completed:  Yes  SDOH Interventions Today    Flowsheet Row Most Recent Value  SDOH Interventions   Food Insecurity Interventions Other (Comment)  [sent info to patient on food pantry sites]  Transportation Interventions Intervention Not Indicated        Care Coordination Interventions Activated:  Yes  Care Coordination Interventions:  Yes, provided   Follow up plan: Referral made to [facility] team    Encounter Outcome:  Pt. Visit Completed   Bevelyn Ngo, BSW, CDP Social Worker, Certified Dementia Practitioner Care Coordination [phone]

## 2021-09-01 NOTE — Patient Instructions (Signed)
Visit Information  Thank you for taking time to visit with me today. Please don't hesitate to contact me if I can be of assistance to you.   Following are the goals we discussed today:   Goals Addressed             This Visit's Progress    Care Coordination Activities       Care Coordination Interventions: SDoH screening performed - identified patient has difficulty affording food due to high medical bills Mailed patient information on food pantry sites in order to assist with food insecurity Reviewed medications to determine patient is not taking Jardiance due to inability to afford medication Discussed opportunity to refer patient to [facility] pharmacy team to assist with patient assistance - patient agreeable Reviewed role of care coordination team - patient not interested in a call with a nurse case manager at this time. SW will follow up over the next month to confirm receipt of mailed resource Referral placed to [place] pharmacy team        Our next appointment is by telephone on 9/19 at 11:45  Please call the care guide team at [phone] if you need to cancel or reschedule your appointment.   If you are experiencing a Mental Health or Behavioral Health Crisis or need someone to talk to, please call 1-800-273-TALK (toll free, 24 hour hotline)  The patient verbalized understanding of instructions, educational materials, and care plan provided today and DECLINED offer to receive copy of patient instructions, educational materials, and care plan.   Telephone follow up appointment with care management team member scheduled for:9/19. You will be contacted by the pharmacy team to discuss Jardiance patient assistance.  Bevelyn Ngo, BSW, CDP Social Worker, Certified Dementia Practitioner Care Coordination [phone]

## 2021-09-02 DIAGNOSIS — M25561 Pain in right knee: Secondary | ICD-10-CM | POA: Diagnosis not present

## 2021-09-02 DIAGNOSIS — M1711 Unilateral primary osteoarthritis, right knee: Secondary | ICD-10-CM | POA: Diagnosis not present

## 2021-09-03 NOTE — Telephone Encounter (Signed)
Request address.

## 2021-09-10 ENCOUNTER — Other Ambulatory Visit: Payer: Self-pay | Admitting: Family Medicine

## 2021-09-10 NOTE — Telephone Encounter (Signed)
This encounter was created in error - please disregard.

## 2021-09-10 NOTE — Telephone Encounter (Deleted)
Patient called requesting refill on medication. Patient does not have contract on file.  Medication pended and sent to Dr. Jacalyn Lefevre, MD

## 2021-09-10 NOTE — Telephone Encounter (Signed)
No contract on file.  Medication pended and sent to Dr. Bertram Millard. [place].

## 2021-09-11 ENCOUNTER — Telehealth: Payer: Self-pay | Admitting: Pharmacist

## 2021-09-11 NOTE — Progress Notes (Signed)
[employer] Customer service manager [facility])                                            [facility] Quality Pharmacy Team    09/11/2021  [name] Mar 31, 1955 [id number]  Patient was referred for medication assistance. The name of the medication needed was not mentioned in the referral.  It is assumed to be Jardiance and perhaps Repatha from brief chart review.  Unfortunately patient has not been able to be contacted.  He was called on 08/28,08/31/and 09/11/21 unsuccessfully.  HIPAA compliant messages were left on his voicemail.  Plan: Close patient's protocol.  Will gladly reopen if the patient reaches out.  Beecher Mcardle, PharmD, BCACP [facility] Clinical Pharmacist [phone]

## 2021-09-23 ENCOUNTER — Telehealth: Payer: Self-pay

## 2021-09-23 NOTE — Patient Outreach (Signed)
  Care Coordination   09/23/2021 Name: [name] MRN: [id number] DOB: [birth year]   Care Coordination Outreach Attempts:  An unsuccessful telephone outreach was attempted for a scheduled appointment today.  Follow Up Plan:  Additional outreach attempts will be made to offer the patient care coordination information and services.   Encounter Outcome:  No Answer  Care Coordination Interventions Activated:  No   Care Coordination Interventions:  No, not indicated    Daneen Schick, BSW, CDP Social Worker, Certified Dementia Practitioner [facility] Care Management  Care Coordination [phone]

## 2021-09-26 ENCOUNTER — Telehealth: Payer: Self-pay | Admitting: Cardiology

## 2021-09-26 ENCOUNTER — Telehealth: Payer: Self-pay | Admitting: *Deleted

## 2021-09-26 NOTE — Telephone Encounter (Signed)
Spoke to patient. Informed patient of office protocol for holding medication for dental extraction . Pre-op  telephone not stated.  Unable to get fax number [ dental office is closed at present time.   Patient verbalized understanding.

## 2021-09-26 NOTE — Telephone Encounter (Signed)
Returned the call to the patient. He stated that the dentist was giving him antibiotics. Nothing further needed.

## 2021-09-26 NOTE — Telephone Encounter (Signed)
Patient had a stent placed a couple of months ago.  His tooth broke this morning, the dentist can see him this morning at 11:15am, but his needs antibiotics prescribed to him.  He said his pharmacy is [place], [place] - [address]

## 2021-09-26 NOTE — Telephone Encounter (Signed)
Patient returned RN's call stating his dentist wants to extract his tooth due to infection and will need patient to be off Plavix 3-5 days prior to the extraction and wanted to get Dr. Doug Sou recommendation.  No date has been set as yet but within the next two weeks.

## 2021-09-26 NOTE — Telephone Encounter (Signed)
Patient is requesting .  Patient called earlier today       Pre-operative Risk Assessment    Patient Name: [name]  DOB: [birth year] MRN: [id number]     Request for Surgical Clearance    Procedure:  Dental Extraction - Amount of Teeth to be Pulled:  1   ( tooth cracked and now has temporary filling on it)   Date of Surgery:  Clearance TBD      ( but in next 2 weeks or so)                            Surgeon:  Dr Pablo Lawrence Surgeon's Group or Practice Name:  [facility] Phone number:  [phone] Fax number:     Type of Clearance Requested:   - Pharmacy:  Hold Clopidogrel (Plavix) 3 to 5  days   Type of Anesthesia:   unknown   Additional requests/questions:    [name]   09/26/2021, 1:46 PM

## 2021-09-29 NOTE — Telephone Encounter (Signed)
Patient is following up, requesting updates on dental clearance.

## 2021-09-30 NOTE — Telephone Encounter (Signed)
   Patient Name: [name]  DOB: 07/25/1955 MRN: [id number]  Primary Cardiologist: Peter Martinique, MD  Chart reviewed as part of pre-operative protocol coverage.   Simple dental extractions (i.e. 1-2 teeth) are considered low risk procedures per guidelines and generally do not require any specific cardiac clearance. It is also generally accepted that for simple extractions and dental cleanings, there is no need to interrupt blood thinner therapy. [name] has history of CABG x4 in 2019. Aspirin/Plavix should be continued.   SBE prophylaxis is not required for the patient from a cardiac standpoint.  Called patient, left recommendations on VM per DPR.   I will route this recommendation to the requesting party via Epic fax function and remove from pre-op pool.  Called dental office for fax number: [phone]   Please call with questions.  Loel Dubonnet, NP 09/30/2021, 11:58 AM

## 2021-10-01 ENCOUNTER — Telehealth: Payer: Self-pay

## 2021-10-01 NOTE — Patient Outreach (Signed)
  Care Coordination   10/01/2021 Name: [name] MRN: [id number] DOB: [birth year]   Care Coordination Outreach Attempts:  A second unsuccessful outreach was attempted today to offer the patient with information about available care coordination services as a benefit of their health plan.     Follow Up Plan:  Additional outreach attempts will be made to offer the patient care coordination information and services.   Encounter Outcome:  No Answer  Care Coordination Interventions Activated:  No   Care Coordination Interventions:  No, not indicated    Daneen Schick, BSW, CDP Social Worker, Certified Dementia Practitioner [facility] Care Management  Care Coordination [phone]

## 2021-10-02 DIAGNOSIS — M1711 Unilateral primary osteoarthritis, right knee: Secondary | ICD-10-CM | POA: Diagnosis not present

## 2021-10-05 NOTE — Progress Notes (Unsigned)
Cardiology Office Note:    Date:  10/08/2021   ID:  [name], DOB March 15, 1955, MRN [id number]  PCP:  Wardell Honour, MD  Cardiologist:  Ally Knodel Martinique, MD  Electrophysiologist:  Virl Axe, MD   Referring MD: Wardell Honour, MD   Chief Complaint  Patient presents with   Coronary Artery Disease   Congestive Heart Failure     History of Present Illness:    [name] is a 66 y.o. male with a hx of CAD s/p CABG, hypertension, left bundle branch block, hyperlipidemia, and chronic systolic heart failure.  Patient was admitted in June 2019 with flash pulmonary edema.  He was ruled in for NSTEMI and underwent cardiac catheterization which showed severe three-vessel CAD. He underwent CABG by Dr Prescott Gum with LIMA to LAD, SVG to diagonal, SVG to ramus intermediate, and SVG to RCA.  He had a syncope in June 2019 while waiting in the neurology office due to urethral stricture.  He was noted to have wide-complex tachycardia on telemetry with heart rate of 170.  EF was 35 to 40%.  Lower extremity Doppler was negative for DVT.  CT of the chest was negative for PE.  He was seen by EP, the underlying rhythm could be either VT versus NSVT.  He was fitted with LifeVest.  Medication was cut back due to mild hypotension and orthostasis.  Repeat echocardiogram in September 2019 showed persistently low EF of 25 to 30%.  He was unable to tolerate Entresto, spironolactone and carvedilol due to orthostatic symptom and the lightheadedness.  Patient eventually underwent BIV ICD implantation in March 2020 by Dr. Caryl Comes after failing another trial of medical therapy.  Since then, repeat echocardiogram in July 2020 did show significant improvement in EF to 40 to 45%.  Was seen by EP on 08/14/20.  Noted persistent chest pain. Also notes increased dyspnea. Mild edema. Was given lasix which he has taken a couple of times. Myoview ordered. This showed EF drop to 17% with large inferolateral scar and anteroapical  ischemia. Repeat Echo is showed EF 40%.  Cardiac cath was done. This showed all grafts patent except for SVG to ramus which was occluded.  Right heart pressures, LV filling pressures and CO were normal. Recommended addition of SGLT 2 inhibitor. He did have lumbar surgery in September 2022.   He was  seen in the office on 06/20/2021 ordered a myriad of concerns including fatigue, lightheadedness, intermittent low heart rate, dyspnea on exertion, and multiple episodes of presyncope. Additionally, he reported bilateral lower extremity edema, right lower leg pain, and intermittent cramping in his calves with activity.  He was scheduled for outpatient cardiac catheterization for further evaluation. He, however, presented to the ED on 06/23/2021 with complaints of shortness of breath.  He was hospitalized from 6/19-6/21/2023 in the setting of acute heart failure exacerbation/CAD with anginal equivalent.  Troponin was mildly elevated. Chest x-ray showed mild CHF, small left pleural effusion. He received IV Lasix.  Lower extremity duplex was negative for DVT.  CTA of the chest was negative for PE.  He underwent cardiac catheterization on 06/24/2021 which revealed stable three-vessel CAD, known occluded SVG-RI, otherwise patent grafts, progression of proximal ramus intermedius lesion  to 80% with positive FFR s/p DES, RHC with relatively normal pressures.    On follow up today he notes his weight has been up about 4 lbs. No increase in swelling. Rides an exercise bike daily for 30 minutes without any problems but still  gets SOB going up stairs. No chest pain. Has arthritis in right knee. Getting injections now. Plans eventually to have TKR.   He is eating very healthy now and follows low sodium diet. Not sure if he is taking lasix but is on other therapy listed   Past Medical History:  Diagnosis Date   AICD (automatic cardioverter/defibrillator) present    Anxiety    Arthritis    "mild; back, neck, spine" (06/09/2017)    CHF (congestive heart failure) (HCC)    Chronic back pain    Coronary artery disease    Dyspnea    GERD (gastroesophageal reflux disease)    History of gout    History of kidney stones X 2   Hypercholesterolemia    Hypertension    LBBB (left bundle branch block)    MI (myocardial infarction) ([place]) 1993   LATERAL   MI (myocardial infarction) ([place]) ?09/2001; 06/07/2017   Pneumonia    "several times" (06/09/2017)   S/P coronary artery stent placement    RCA   Varicose veins     Past Surgical History:  Procedure Laterality Date   ANTERIOR CERVICAL DECOMP/DISCECTOMY FUSION  03/03/2011   Procedure: ANTERIOR CERVICAL DECOMPRESSION/DISCECTOMY FUSION 3 LEVELS;  Surgeon: Floyce Stakes, MD;  Location: [facility];  Service: Neurosurgery;  Laterality: N/A;  Cervical three-four Cervical four-five Cervical five-six Anterior cervical decompression/diskectomy, fusion   APPLICATION OF ROBOTIC ASSISTANCE FOR SPINAL PROCEDURE N/A 10/03/2020   Procedure: APPLICATION OF ROBOTIC ASSISTANCE FOR SPINAL PROCEDURE;  Surgeon: Consuella Lose, MD;  Location: [place];  Service: Neurosurgery;  Laterality: N/A;   BACK SURGERY     CARDIAC CATHETERIZATION  2009   Stents in RCA patent. 70 to 80% PL, and 50% ostial PD.    CARDIAC CATHETERIZATION N/A 11/20/2014   Procedure: Left Heart Cath and Coronary Angiography;  Surgeon: Ryder Man M Martinique, MD;  Location: [place] [facility];  Service: Cardiovascular;  Laterality: N/A;   CORONARY ANGIOPLASTY     DIRECT ANGIOPLASTY THE MARGINAL BRANCH   CORONARY ANGIOPLASTY WITH STENT PLACEMENT     RCA   CORONARY ARTERY BYPASS GRAFT N/A 06/14/2017   Procedure: CORONARY ARTERY BYPASS GRAFTING (CABG) x four, using left internal mammary artery and right leg greater saphenous vein harvested endoscopically;  Surgeon: Ivin Poot, MD;  Location: [place];  Service: Open Heart Surgery;  Laterality: N/A;   CORONARY STENT INTERVENTION N/A 06/24/2021   Procedure: CORONARY STENT  INTERVENTION;  Surgeon: Leonie Man, MD;  Location: [place] [facility];  Service: Cardiovascular;  Laterality: N/A;   CYSTOSCOPY N/A 06/14/2017   Procedure: CYSTOSCOPY;  Surgeon: Ivin Poot, MD;  Location: [place];  Service: Open Heart Surgery;  Laterality: N/A;   ICD IMPLANT N/A 03/11/2018   Procedure: ICD IMPLANT - Dual Chamber;  Surgeon: Deboraha Sprang, MD;  Location: [place] [facility];  Service: Cardiovascular;  Laterality: N/A;   INSERTION OF SUPRAPUBIC CATHETER N/A 06/14/2017   Procedure: INSERTION OF SUPRAPUBIC CATHETER - Lower abdomen;  Surgeon: Ivin Poot, MD;  Location: [place];  Service: Open Heart Surgery;  Laterality: N/A;   INTRAVASCULAR PRESSURE WIRE/FFR STUDY N/A 06/24/2021   Procedure: INTRAVASCULAR PRESSURE WIRE/FFR STUDY;  Surgeon: Leonie Man, MD;  Location: [place] [facility];  Service: Cardiovascular;  Laterality: N/A;   POSTERIOR LUMBAR FUSION  10/2011   RIGHT/LEFT HEART CATH AND CORONARY ANGIOGRAPHY N/A 06/08/2017   Procedure: RIGHT/LEFT HEART CATH AND CORONARY ANGIOGRAPHY;  Surgeon: Martinique, Layan Zalenski M, MD;  Location: [facility];  Service: Cardiovascular;  Laterality: N/A;   RIGHT/LEFT HEART CATH AND CORONARY/GRAFT ANGIOGRAPHY N/A 09/06/2020   Procedure: RIGHT/LEFT HEART CATH AND CORONARY/GRAFT ANGIOGRAPHY;  Surgeon: [place], Jacquese Cassarino M, MD;  Location: [facility];  Service: Cardiovascular;  Laterality: N/A;   RIGHT/LEFT HEART CATH AND CORONARY/GRAFT ANGIOGRAPHY N/A 06/24/2021   Procedure: RIGHT/LEFT HEART CATH AND CORONARY/GRAFT ANGIOGRAPHY;  Surgeon: Marykay Lex, MD;  Location: [facility];  Service: Cardiovascular;  Laterality: N/A;   TEE WITHOUT CARDIOVERSION N/A 06/14/2017   Procedure: TRANSESOPHAGEAL ECHOCARDIOGRAM (TEE);  Surgeon: Donata Clay, Theron Arista, MD;  Location: [facility];  Service: Open Heart Surgery;  Laterality: N/A;   URETHROPLASTY N/A 10/15/2017   Procedure: URETHROPLASTY WITH BUCCAL GRAFT HARVAST;  Surgeon: Crist Fat, MD;   Location: [facility];  Service: Urology;  Laterality: N/A;    Current Medications: Current Meds  Medication Sig   acetaminophen (TYLENOL) 500 MG tablet Take 1,000 mg by mouth every 4 (four) hours as needed for mild pain. Morning & noon   aspirin 81 MG EC tablet Take 81 mg by mouth in the morning.   carvedilol (COREG) 6.25 MG tablet Take 0.5 tablets (3.125 mg total) by mouth 2 (two) times daily with a meal. Must have office visit for additional refills [phone]   clopidogrel (PLAVIX) 75 MG tablet Take 1 tablet (75 mg total) by mouth daily with breakfast.   empagliflozin (JARDIANCE) 10 MG TABS tablet Take 10 mg by mouth daily.   furosemide (LASIX) 40 MG tablet Take 1 tablet (40 mg total) by mouth daily.   nitroGLYCERIN (NITROSTAT) 0.4 MG SL tablet Place 1 tablet (0.4 mg total) under the tongue every 5 (five) minutes x 3 doses as needed for chest pain.   pantoprazole (PROTONIX) 40 MG tablet Take 1 tablet (40 mg total) by mouth daily.   REPATHA SURECLICK 140 MG/ML SOAJ INJECT INTO SKIN EVERY 14 DAYS (Patient taking differently: Inject 140 mg into the muscle every 14 (fourteen) days.)   valsartan (DIOVAN) 80 MG tablet Take 1 tablet (80 mg total) by mouth daily.   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  1987      Do you live in a house, apartment, assisted living, condo, trailer, etc.?  House      Is it one or more stories?  2 stories      How many persons live in your home?  2         Do you have any pets in your home?(please list)  No      Highest level of education completed:  11 Years      Current or past profession:  Sales-past      Do you exercise?:  YES    Type and how often:  Cardio and weights/At least everyday      Do you have a Living Will? (Form that indicates scenarios where you would not want your life prolonged)  YES      Do you have a DNR form?  YES       If not, would you like to discuss one?      Do you have signed POA/HPOA forms?  NO      Do you have difficulty bathing or dressing yourself?  NO      Do you have difficulty preparing food or eating?  NO      Do you have difficulty managing medications?  NO      Do you have difficulty managing your finances?  NO      Do you have difficulty affording your medications?  NO                     Social Determinants of Health   Financial Resource Strain: Low Risk  (01/09/2021)   Overall Financial Resource Strain (CARDIA)    Difficulty of Paying Living Expenses: Not very hard  Food Insecurity: Food Insecurity Present (09/01/2021)   Hunger Vital Sign    Worried About Running Out of Food in the Last Year: Never true    Ran Out of Food in the Last Year: Sometimes true  Transportation Needs: No Transportation Needs (09/01/2021)   PRAPARE - Hydrologist (Medical): No    Lack of Transportation (Non-Medical): No  Physical Activity: Not on file  Stress: Not on file  Social Connections: Not on file     Family History: The patient's family history includes Breast cancer in his mother;  Coronary artery disease in his mother; Diabetes in his father; Heart attack in his brother, father, and maternal grandfather; Heart disease in his brother; Stroke in his father.  ROS:   Please see the history of present illness.     All other systems reviewed and are negative.  EKGs/Labs/Other Studies Reviewed:    The following studies were reviewed today:  Echo 07/11/2018 1. The left ventricle has mild-moderately reduced systolic function, with an ejection fraction of 40-45%. The cavity size was normal. Left ventricular diastolic Doppler parameters are indeterminate. Indeterminate filling pressures.  2. The right ventricle has normal systolic function. The cavity was normal. There is no increase in right ventricular wall thickness. Right ventricular systolic pressure could not be assessed.  3. Left atrial size was mildly dilated.  4. Aortic valve regurgitation is mild to moderate by color flow Doppler. No stenosis of the aortic valve.  5. The Ascending aorta measures 39 mm, within normal limits when indexed to body surface area.  6. When compared to the prior study: 01/21/2018- LV ejection fraction has improved. Ventricular septal contraction is less dysynchronous. Aortic valve regurgitation has not significantly changed. Side by side comparison of images performed.  Myoview 08/21/20: Study Highlights    Nuclear stress EF: 17%. The left ventricular ejection fraction is severely decreased (<30%). There was no ST  segment deviation noted during stress. Defect 1: There is a large defect of severe severity present in the basal inferior, basal inferolateral, mid inferior, mid inferolateral and apical inferior location. Defect 2: There is a small defect of mild severity present in the apical anterior location. This is a high risk study. Findings consistent with ischemia and prior myocardial infarction.   High risk study due to severely decreased left ventricular function. There is a large (old)  inferolateral scar. There is a small area of mild anteroapical ischemia. There has been a marked reduction in left ventricular systolic function since 8502.  Recommend correlation with echo and consider possible "balanced ischemia". Compared to ECG from 02/05/2020, there is a marked reduction in the amplitude of the initial R wave in V1, although the initial deflection is still positive. Visually, there appears to be substantial septal-lateral dyssynchrony. Consider reevaluation of CRT device settings.    Echo 08/30/20: IMPRESSIONS     1. Poor acoustic windows limit study. Definity used. LVEF is moderately  depressed with diffuse hypokinesis worse in the annteiror and lateral  walls. LVEF is approximately 40%. The left ventricle has moderately  decreased function. The left ventricular  internal cavity size was severely dilated. There is mild left ventricular  hypertrophy. Indeterminate diastolic filling due to E-A fusion.   2. Right ventricular systolic function is normal. The right ventricular  size is normal.   3. Mild mitral valve regurgitation.   4. Aortic valve regurgitation is mild. Mild aortic valve sclerosis is  present, with no evidence of aortic valve stenosis.   5. Aortic dilatation noted. There is mild dilatation of the aortic root,  measuring 42 mm.   Comparison(s): The left ventricular function is unchanged.   Cardiac cath 09/06/20:  RIGHT/LEFT HEART CATH AND CORONARY/GRAFT ANGIOGRAPHY   Conclusion      Ost LM to Mid LM lesion is 40% stenosed.   Prox LAD to Mid LAD lesion is 100% stenosed.   Ramus lesion is 60% stenosed.   Prox RCA lesion is 90% stenosed.   Origin lesion is 100% stenosed.   SVG graft was visualized by angiography and is normal in caliber.   SVG graft was visualized by angiography.   SVG graft was visualized by angiography and is normal in caliber.   LIMA graft was visualized by angiography and is normal in caliber.   The graft exhibits no disease.    The graft exhibits no disease.   The graft exhibits no disease.   LV end diastolic pressure is normal.   There is no aortic valve stenosis.   3 vessel CAD.  Patent LIMA to the LAD.  Patent SVG to the first diagonal Patent SVG to the PDA Occluded SVG to the Ramus intermediate Normal LV filling pressures Normal right heart pressures Normal cardiac output   Plan: recommend continued medical management. The lesion in the ramus intermediate is moderate - in some views appears <40% and other views about 60-65%. This is certainly not critical. Good flow down the native vessel is probably the reason for graft closure. His EF by Echo is in line with prior Echo in 2020. Based on current findings I cannot explain his dyspnea. Would consider outpatient sleep study and/or PFTs to consider other causes. We will add Jardiance for his CHF and DM. He does not need diuresis.    Echo 06/16/21: IMPRESSIONS     1. Left ventricular ejection fraction, by estimation, is 30 to 35%. Left  ventricular ejection fraction by 3D  volume is 31 %. The left ventricle has  moderately decreased function. The left ventricle demonstrates global  hypokinesis. The left ventricular  internal cavity size was moderately dilated. Left ventricular diastolic  parameters are consistent with Grade I diastolic dysfunction (impaired  relaxation).   2. Right ventricular systolic function is mildly reduced. The right  ventricular size is normal. There is normal pulmonary artery systolic  pressure. The estimated right ventricular systolic pressure is 15.6 mmHg.   3. Left atrial size was mild to moderately dilated.   4. The mitral valve is grossly normal. Mild mitral valve regurgitation.  No evidence of mitral stenosis.   5. The aortic valve is tricuspid. Aortic valve regurgitation is mild. No  aortic stenosis is present.   6. There is mild dilatation of the ascending aorta, measuring 41 mm.   7. The inferior vena cava is normal in  size with greater than 50%  respiratory variability, suggesting right atrial pressure of 3 mmHg.   Comparison(s): Changes from prior study are noted. The left ventricular  function is worsened.    Cardiac cath 06/24/21: Procedures  CORONARY STENT INTERVENTION  INTRAVASCULAR PRESSURE WIRE/FFR STUDY  RIGHT/LEFT HEART CATH AND CORONARY/GRAFT ANGIOGRAPHY   Conclusion      -------------- NATIVE CORONARIES------------   Prox RCA lesion is 90% stenosed.  Dist RCA lesion is 90% stenosed.   Ost LM to Mid LM lesion is 40% stenosed.   Prox LAD lesion is 100% stenosed prior to D1. Prox LAD to Mid LAD lesion is 100% stenosed following D1.   CULPRIT LESION: Ramus lesion is 75% stenosed.  (Borderline positive by RFR (0.91) and FFR 0.80) -> given the patient's symptoms, this is felt to be significant.   A drug-eluting stent was successfully placed using a SYNERGY XD 3.0X16 -> deployed to 3.3 mm.   Post intervention, there is a 0% residual stenosis.   -------------- GRAFTS -------------------------------   LIMA-LAD graft was visualized by angiography and is normal in caliber. The graft exhibits no disease. There is no competitive flow   SVG-D1 graft was visualized by angiography and is normal in caliber. The graft exhibits no disease, and the flow is not reversed.  There is retrograde filling of a major septal perforator trunk in the LAD that it would otherwise not be perfused by the native LAD or retrograde filling from the LIMA..   SVG-rPDA graft was visualized by angiography and is moderate in size.  The graft exhibits no disease. There is trivial competitive flow   SVG-RI graft was visualized by angiography, Origin lesion is 100% stenosed.   -------------- HEMODYNAMICS-----------------   LV end diastolic pressure is normal.   There is no aortic valve stenosis.   POST-OPERATIVE DIAGNOSIS:   Stable Three-Vessel CAD: Ostial mid OM 40%.  Proximal to mid LAD 100%.  Proximal RCA 90% at bend with trace  filling of the PDA and PL.  Patent LCx that is small and nondominant.   Culprit lesion: Progression of proximal Ramus Intermedius (RI) lesion from 60% to 70-80% -> RFR 0.90, 0.91 with a FFR of 0.80.  Based on his symptoms thought to be significant. Successful DES PCI of ostial and proximal L RI using Synergy DES 3.0 mm x 16 mm deployed to 3.3 mm. Reducing lesion from 70-80% to 0% TIMI-3 flow pre and post.   Known occluded SVG-RI with otherwise patent SVG-PDA, SVG-D1, and LIMA-LAD RHC pressures relatively normal:  RAP 2 mmHg, RVP-EDP 28/0-0 mmHg; PAP-mean 19/10-15 mmHg, PCWP 16 mmHg; LVP-EDP 120/1-12  mmHg,  AoP-MAP 116/54 mmHg - 72 mmHg; Ao sat 98%, PA sat 69% Cardiac Output-Index: Fick 5.32-2.33; TD 3.21-1.4 (suggestive of valvular disease.   PLAN OF CARE:  Return to nursing floor for post PCI care after sheath removal. ->  Dual and platelet therapy for 6 months, then okay to stop aspirin.  Seems relatively euvolemic on right heart cath.     Glenetta Hew, MD  Right Heart  Right Heart Pressures PAP-mean 19/10-15 mmHg,  PCWP 16 mmHg;  LV EDP is normal. Minimally elevated: LVP-EDP 120/1-12 mmHg,  RHC pressures relatively normal:   AoP-MAP 116/54 mmHg - 72 mmHg;   Ao sat 98%, PA sat 69%  Cardiac Output-Index: Fick 5.32-2.33; TD 3.21-1.4 (suggestive of valvular disease.  Right Atrium Right atrial pressure is normal. RAP 2 mmHg,  Right Ventricle RVP-EDP 28/0-0 mmHg;  Coronary Diagrams  Diagnostic Dominance: Right  Intervention    EKG:  EKG is not ordered today.   Recent Labs: 06/23/2021: ALT 18; B Natriuretic Peptide 338.6 06/25/2021: BUN 14; Creatinine, Ser 1.00; Hemoglobin 14.3; Platelets 199; Potassium 4.8; Sodium 139  Recent Lipid Panel    Component Value Date/Time   CHOL 153 08/30/2020 0828   TRIG 168 (H) 08/30/2020 0828   HDL 54 08/30/2020 0828   CHOLHDL 2.8 08/30/2020 0828   CHOLHDL 2.6 10/29/2017 0939   VLDL 27 10/29/2017 0939   LDLCALC 71 08/30/2020 0828     Physical Exam:    VS:  BP 110/62   Pulse 87   Ht $R'6\' 2"'Sp$  (1.88 m)   Wt 230 lb 0.3 oz (104.3 kg)   SpO2 95%   BMI 29.53 kg/m     Wt Readings from Last 3 Encounters:  10/08/21 230 lb 0.3 oz (104.3 kg)  07/04/21 224 lb 3.2 oz (101.7 kg)  06/25/21 221 lb 3.2 oz (100.3 kg)     GEN:  Well nourished, well developed in no acute distress HEENT: Normal NECK: No JVD; No carotid bruits LYMPHATICS: No lymphadenopathy CARDIAC: RRR, no murmurs, rubs, gallops. + pain on palpation left precordium. RESPIRATORY:  Clear to auscultation without rales, wheezing or rhonchi  ABDOMEN: Soft, non-tender, non-distended MUSCULOSKELETAL:  tr  edema; No deformity  SKIN: Warm and dry NEUROLOGIC:  Alert and oriented x 3 PSYCHIATRIC:  Normal affect   ASSESSMENT:    1. Chronic systolic heart failure ([place])   2. Coronary artery disease of native artery of native heart with stable angina pectoris ([place])   3. LBBB (left bundle branch block)   4. Hyperlipidemia LDL goal <70   5. Essential hypertension       PLAN:    In order of problems listed above:  CAD s/p CABG 2019: Abnormal Myoview with large inferolateral scar and anteroapical ischemia last fall.  Cardiac cath showed patent grafts except SVG to ramus which was occluded. More recent admission in June with stenting of native ramus intermediate. Continue AspirinPlavix for 6 months then stop Plavix. continue carvedilol. He would be a suitable candidate for TKR once off Plavix.   Hyperlipidemia: On Repatha. Statin intolerant.   Acute on Chronic combined systolic/diastolic  heart failure: Last EF by Echo was 40% and is s/p BiV ICD. On Coreg and valsartan. Entresto resulted in low BPs. On Jardiance.  Right heart cath normal right heart and LV filling pressures. Normal cardiac output. Should be on lasix 40 mg daily. Will check his medications for this.   Status post biventricular ICD: Followed by EP.   recent device interrogation did not mention  any  ventricular tachycardia.   GERD  6.   Lumbar disc disease. S/p surgery   Medication Adjustments/Labs and Tests Ordered: Current medicines are reviewed at length with the patient today.  Concerns regarding medicines are outlined above.  No orders of the defined types were placed in this encounter.   No orders of the defined types were placed in this encounter.    There are no Patient Instructions on file for this visit. Follow up in 6 months with fasting labs.   Signed, Hoyle Barkdull Martinique, MD  10/08/2021 9:17 AM    [facility]

## 2021-10-06 ENCOUNTER — Telehealth: Payer: Self-pay

## 2021-10-06 NOTE — Patient Outreach (Signed)
  Care Coordination   10/06/2021 Name: [name] MRN: [id number] DOB: 07/15/1955   Care Coordination Outreach Attempts:  A third unsuccessful outreach was attempted today to offer the patient with information about available care coordination services as a benefit of their health plan.   Follow Up Plan:  No further outreach attempts will be made at this time. We have been unable to contact the patient to offer or enroll patient in care coordination services  Encounter Outcome:  No Answer  Care Coordination Interventions Activated:  No   Care Coordination Interventions:  No, not indicated    Daneen Schick, BSW, CDP Social Worker, Certified Dementia Practitioner [facility] [phone]

## 2021-10-08 ENCOUNTER — Telehealth: Payer: Self-pay | Admitting: Cardiology

## 2021-10-08 ENCOUNTER — Other Ambulatory Visit: Payer: Self-pay

## 2021-10-08 ENCOUNTER — Ambulatory Visit: Payer: PPO | Attending: Cardiology | Admitting: Cardiology

## 2021-10-08 ENCOUNTER — Encounter: Payer: Self-pay | Admitting: Cardiology

## 2021-10-08 VITALS — BP 110/62 | HR 87 | Ht 74.0 in | Wt 230.0 lb

## 2021-10-08 DIAGNOSIS — I5022 Chronic systolic (congestive) heart failure: Secondary | ICD-10-CM

## 2021-10-08 DIAGNOSIS — I10 Essential (primary) hypertension: Secondary | ICD-10-CM

## 2021-10-08 DIAGNOSIS — E785 Hyperlipidemia, unspecified: Secondary | ICD-10-CM

## 2021-10-08 DIAGNOSIS — I447 Left bundle-branch block, unspecified: Secondary | ICD-10-CM

## 2021-10-08 DIAGNOSIS — I25118 Atherosclerotic heart disease of native coronary artery with other forms of angina pectoris: Secondary | ICD-10-CM

## 2021-10-08 NOTE — Telephone Encounter (Signed)
Pt calling back as requested to let nurse know that he is taking all medication that is on his med list. Please advise

## 2021-10-09 ENCOUNTER — Other Ambulatory Visit: Payer: Self-pay | Admitting: Family Medicine

## 2021-10-09 DIAGNOSIS — M25561 Pain in right knee: Secondary | ICD-10-CM | POA: Diagnosis not present

## 2021-10-15 ENCOUNTER — Ambulatory Visit (INDEPENDENT_AMBULATORY_CARE_PROVIDER_SITE_OTHER): Payer: PPO

## 2021-10-15 DIAGNOSIS — I447 Left bundle-branch block, unspecified: Secondary | ICD-10-CM | POA: Diagnosis not present

## 2021-10-15 LAB — CUP PACEART REMOTE DEVICE CHECK
Battery Remaining Longevity: 13 mo
Battery Voltage: 2.9 V
Brady Statistic AP VP Percent: 0.12 %
Brady Statistic AP VS Percent: 0.05 %
Brady Statistic AS VP Percent: 98.35 %
Brady Statistic AS VS Percent: 1.48 %
Brady Statistic RA Percent Paced: 0.17 %
Brady Statistic RV Percent Paced: 97.15 %
Date Time Interrogation Session: 20231011012304
HighPow Impedance: 80 Ohm
Implantable Lead Implant Date: 20190610
Implantable Lead Implant Date: 20200306
Implantable Lead Implant Date: 20200306
Implantable Lead Location: 753858
Implantable Lead Location: 753859
Implantable Lead Location: 753860
Implantable Lead Model: 5071
Implantable Lead Model: 5076
Implantable Pulse Generator Implant Date: 20200306
Lead Channel Impedance Value: 285 Ohm
Lead Channel Impedance Value: 4047 Ohm
Lead Channel Impedance Value: 4047 Ohm
Lead Channel Impedance Value: 437 Ohm
Lead Channel Impedance Value: 456 Ohm
Lead Channel Impedance Value: 494 Ohm
Lead Channel Pacing Threshold Amplitude: 0.625 V
Lead Channel Pacing Threshold Amplitude: 0.875 V
Lead Channel Pacing Threshold Amplitude: 2.125 V
Lead Channel Pacing Threshold Pulse Width: 0.4 ms
Lead Channel Pacing Threshold Pulse Width: 0.4 ms
Lead Channel Pacing Threshold Pulse Width: 1 ms
Lead Channel Sensing Intrinsic Amplitude: 3 mV
Lead Channel Sensing Intrinsic Amplitude: 3 mV
Lead Channel Sensing Intrinsic Amplitude: 3.5 mV
Lead Channel Sensing Intrinsic Amplitude: 3.5 mV
Lead Channel Setting Pacing Amplitude: 1.75 V
Lead Channel Setting Pacing Amplitude: 2.5 V
Lead Channel Setting Pacing Amplitude: 3 V
Lead Channel Setting Pacing Pulse Width: 0.4 ms
Lead Channel Setting Pacing Pulse Width: 1 ms
Lead Channel Setting Sensing Sensitivity: 0.6 mV

## 2021-10-16 DIAGNOSIS — M1711 Unilateral primary osteoarthritis, right knee: Secondary | ICD-10-CM | POA: Diagnosis not present

## 2021-10-27 ENCOUNTER — Telehealth: Payer: Self-pay | Admitting: *Deleted

## 2021-10-27 NOTE — Telephone Encounter (Signed)
   Pre-operative Risk Assessment    Patient Name: [name]  DOB: August 16, 1955 MRN: [id number]      Request for Surgical Clearance    Procedure:   RIGHT TOTAL KNEE ARTHROPLASTY  Date of Surgery:  Clearance TBD (DEC 2023)                               Surgeon:  DR. Jonn Shingles  Surgeon's Group or Practice Name:  [facility] Phone number:  [phone] Fax number:  [phone] ATTN: KERRI MAZE   Type of Clearance Requested:   - Medical  - Pharmacy:  Hold Aspirin and Clopidogrel (Plavix)     Type of Anesthesia:  Spinal   Additional requests/questions:    Jiles Prows   10/27/2021, 1:38 PM

## 2021-10-27 NOTE — Telephone Encounter (Signed)
     Primary Cardiologist: Peter Martinique, MD  Chart reviewed as part of pre-operative protocol coverage. Given past medical history and time since last visit, based on ACC/AHA guidelines, DREUX MCGROARTY would be at acceptable risk for the planned procedure without further cardiovascular testing.   His Plavix may be held for 5 days prior to his surgery.  His aspirin will need to be continued throughout his procedure.  Please resume his Plavix as soon as hemostasis is achieved.  His RCRI is a class III risk, 6.6% risk of major cardiac event.  I will route this recommendation to the requesting party via Epic fax function and remove from pre-op pool.  Please call with questions.  Jossie Ng. Syrianna Schillaci NP-C     10/27/2021, 2:20 PM [facility] [address] Office [phone] Fax [phone]

## 2021-10-28 NOTE — Progress Notes (Signed)
Remote ICD transmission.   

## 2021-10-31 ENCOUNTER — Other Ambulatory Visit: Payer: Self-pay | Admitting: Family Medicine

## 2021-11-14 ENCOUNTER — Other Ambulatory Visit: Payer: Self-pay

## 2021-11-14 DIAGNOSIS — M7061 Trochanteric bursitis, right hip: Secondary | ICD-10-CM | POA: Diagnosis not present

## 2021-11-14 DIAGNOSIS — M25551 Pain in right hip: Secondary | ICD-10-CM | POA: Diagnosis not present

## 2021-11-14 MED ORDER — CLOPIDOGREL BISULFATE 75 MG PO TABS: 75.0000 mg | ORAL_TABLET | Freq: Every day | ORAL | 11 refills | Status: DC

## 2021-11-25 NOTE — Progress Notes (Signed)
Please place orders in epic pt. Is scheduled for preop 

## 2021-12-01 NOTE — H&P (Signed)
TOTAL KNEE ADMISSION H&P  Patient is being admitted for right total knee arthroplasty.  Subjective:  Chief Complaint:right knee pain.  HPI: [name], 66 y.o. male, has a history of pain and functional disability in the right knee due to arthritis and has failed non-surgical conservative treatments for greater than 12 weeks to includeNSAID's and/or analgesics, corticosteriod injections, viscosupplementation injections, supervised PT with diminished ADL's post treatment, and activity modification.  Onset of symptoms was gradual, starting 4 years ago with gradually worsening course since that time. The patient noted no past surgery on the right knee(s).  Patient currently rates pain in the right knee(s) at 10 out of 10 with activity. Patient has night pain, worsening of pain with activity and weight bearing, pain that interferes with activities of daily living, pain with passive range of motion, and joint swelling.  Patient has evidence of subchondral sclerosis, periarticular osteophytes, and joint space narrowing by imaging studies. This patient has had  no previous anatomy altering injury . There is no active infection.  Patient Active Problem List   Diagnosis Date Noted   Acute on chronic systolic CHF (congestive heart failure), NYHA class 3 (HCC) 06/23/2021   Low testosterone 03/10/2021   Lumbar back pain 10/03/2020   Pseudoarthrosis of lumbar spine 10/03/2020   Unstable angina (HCC) 09/06/2020   Encounter for health maintenance examination in adult 09/03/2020   Encounter for screening for malignant neoplasm of colon 09/03/2020   Impaired fasting blood sugar 09/03/2020   Fatigue 09/03/2020   Screening for prostate cancer 09/03/2020   History of gout 09/03/2020   Medicare welcome exam 09/03/2020   Insomnia 01/11/2020   Back pain 01/11/2020   History of urinary tract infection 01/11/2020   Vaccine counseling 01/11/2020   Rumination 01/11/2020   Anxiety 01/11/2020   Chronic diarrhea  12/05/2019   Food intolerance 12/05/2019   Abdominal cramping 12/05/2019   Abdominal bloating 12/05/2019   Need for influenza vaccination 12/05/2019   Right knee pain 11/03/2019   Shoulder pain, acute 06/09/2019   Hip pain 02/24/2019   Knee pain 02/24/2019   Bulbous urethral stricture 10/15/2017   Personal history of urethral stricture 08/10/2017   Chronic systolic heart failure (HCC) 06/22/2017   S/P CABG x 4 06/14/2017   ACS (acute coronary syndrome) (HCC) 06/08/2017   Angina pectoris (HCC)    Gout 08/30/2014   Neck pain, chronic 12/17/2010   Back pain, chronic 12/01/2010   Pericarditis 10/02/2010   CAD (coronary artery disease) 05/14/2010   Hypertension    Hypercholesterolemia    LBBB (left bundle branch block)    NSTEMI (non-ST elevated myocardial infarction) [facility])    Past Medical History:  Diagnosis Date   AICD (automatic cardioverter/defibrillator) present    Anxiety    Arthritis    "mild; back, neck, spine" (06/09/2017)   CHF (congestive heart failure) (HCC)    Chronic back pain    Coronary artery disease    Dyspnea    GERD (gastroesophageal reflux disease)    History of gout    History of kidney stones X 2   Hypercholesterolemia    Hypertension    LBBB (left bundle branch block)    MI (myocardial infarction) (HCC) 1993   LATERAL   MI (myocardial infarction) (HCC) ?09/2001; 06/07/2017   Pneumonia    "several times" (06/09/2017)   S/P coronary artery stent placement    RCA   Varicose veins     Past Surgical History:  Procedure Laterality Date   ANTERIOR CERVICAL  DECOMP/DISCECTOMY FUSION  03/03/2011   Procedure: ANTERIOR CERVICAL DECOMPRESSION/DISCECTOMY FUSION 3 LEVELS;  Surgeon: Karn Cassis, MD;  Location: [facility] NEURO ORS;  Service: Neurosurgery;  Laterality: N/A;  Cervical three-four Cervical four-five Cervical five-six Anterior cervical decompression/diskectomy, fusion   APPLICATION OF ROBOTIC ASSISTANCE FOR SPINAL PROCEDURE N/A 10/03/2020   Procedure:  APPLICATION OF ROBOTIC ASSISTANCE FOR SPINAL PROCEDURE;  Surgeon: Lisbeth Renshaw, MD;  Location: [facility] OR;  Service: Neurosurgery;  Laterality: N/A;   BACK SURGERY     CARDIAC CATHETERIZATION  2009   Stents in RCA patent. 70 to 80% PL, and 50% ostial PD.    CARDIAC CATHETERIZATION N/A 11/20/2014   Procedure: Left Heart Cath and Coronary Angiography;  Surgeon: Peter M Swaziland, MD;  Location: [facility] INVASIVE CV LAB;  Service: Cardiovascular;  Laterality: N/A;   CORONARY ANGIOPLASTY     DIRECT ANGIOPLASTY THE MARGINAL BRANCH   CORONARY ANGIOPLASTY WITH STENT PLACEMENT     RCA   CORONARY ARTERY BYPASS GRAFT N/A 06/14/2017   Procedure: CORONARY ARTERY BYPASS GRAFTING (CABG) x four, using left internal mammary artery and right leg greater saphenous vein harvested endoscopically;  Surgeon: Kerin Perna, MD;  Location: [facility] OR;  Service: Open Heart Surgery;  Laterality: N/A;   CORONARY STENT INTERVENTION N/A 06/24/2021   Procedure: CORONARY STENT INTERVENTION;  Surgeon: Marykay Lex, MD;  Location: [facility] INVASIVE CV LAB;  Service: Cardiovascular;  Laterality: N/A;   CYSTOSCOPY N/A 06/14/2017   Procedure: CYSTOSCOPY;  Surgeon: Kerin Perna, MD;  Location: [facility] OR;  Service: Open Heart Surgery;  Laterality: N/A;   ICD IMPLANT N/A 03/11/2018   Procedure: ICD IMPLANT - Dual Chamber;  Surgeon: [name], MD;  Location: [facility] INVASIVE CV LAB;  Service: Cardiovascular;  Laterality: N/A;   INSERTION OF SUPRAPUBIC CATHETER N/A 06/14/2017   Procedure: INSERTION OF SUPRAPUBIC CATHETER - Lower abdomen;  Surgeon: Kerin Perna, MD;  Location: [facility] OR;  Service: Open Heart Surgery;  Laterality: N/A;   INTRAVASCULAR PRESSURE WIRE/FFR STUDY N/A 06/24/2021   Procedure: INTRAVASCULAR PRESSURE WIRE/FFR STUDY;  Surgeon: Marykay Lex, MD;  Location: [facility] INVASIVE CV LAB;  Service: Cardiovascular;  Laterality: N/A;   POSTERIOR LUMBAR FUSION  10/2011   RIGHT/LEFT HEART CATH AND CORONARY ANGIOGRAPHY N/A 06/08/2017    Procedure: RIGHT/LEFT HEART CATH AND CORONARY ANGIOGRAPHY;  Surgeon: Swaziland, Peter M, MD;  Location: [facility] INVASIVE CV LAB;  Service: Cardiovascular;  Laterality: N/A;   RIGHT/LEFT HEART CATH AND CORONARY/GRAFT ANGIOGRAPHY N/A 09/06/2020   Procedure: RIGHT/LEFT HEART CATH AND CORONARY/GRAFT ANGIOGRAPHY;  Surgeon: Swaziland, Peter M, MD;  Location: [facility] INVASIVE CV LAB;  Service: Cardiovascular;  Laterality: N/A;   RIGHT/LEFT HEART CATH AND CORONARY/GRAFT ANGIOGRAPHY N/A 06/24/2021   Procedure: RIGHT/LEFT HEART CATH AND CORONARY/GRAFT ANGIOGRAPHY;  Surgeon: Marykay Lex, MD;  Location: [facility] INVASIVE CV LAB;  Service: Cardiovascular;  Laterality: N/A;   TEE WITHOUT CARDIOVERSION N/A 06/14/2017   Procedure: TRANSESOPHAGEAL ECHOCARDIOGRAM (TEE);  Surgeon: Donata Clay, Theron Arista, MD;  Location: [facility] OR;  Service: Open Heart Surgery;  Laterality: N/A;   URETHROPLASTY N/A 10/15/2017   Procedure: URETHROPLASTY WITH BUCCAL GRAFT HARVAST;  Surgeon: Crist Fat, MD;  Location: [facility] ORS;  Service: Urology;  Laterality: N/A;    No current facility-administered medications for this encounter.   Current Outpatient Medications  Medication Sig Dispense Refill Last Dose   acetaminophen (TYLENOL) 500 MG tablet Take 1,000 mg by mouth every 4 (four) hours as needed for mild pain. Morning & noon  aspirin 81 MG EC tablet Take 81 mg by mouth in the morning.      carvedilol (COREG) 6.25 MG tablet Take 0.5 tablets (3.125 mg total) by mouth 2 (two) times daily with a meal. Must have office visit for additional refills [phone] 60 tablet 0    clopidogrel (PLAVIX) 75 MG tablet Take 1 tablet (75 mg total) by mouth daily with breakfast. 30 tablet 11    empagliflozin (JARDIANCE) 10 MG TABS tablet Take 10 mg by mouth daily.      furosemide (LASIX) 40 MG tablet Take 1 tablet (40 mg total) by mouth daily. 90 tablet 3    nitroGLYCERIN (NITROSTAT) 0.4 MG SL tablet Place 1 tablet (0.4 mg total) under the tongue every 5 (five) minutes x 3  doses as needed for chest pain. 25 tablet 2    pantoprazole (PROTONIX) 40 MG tablet Take 1 tablet (40 mg total) by mouth daily. 30 tablet 1    REPATHA SURECLICK 140 MG/ML SOAJ INJECT INTO SKIN EVERY 14 DAYS (Patient taking differently: Inject 140 mg into the muscle every 14 (fourteen) days.) 2 mL 11    valsartan (DIOVAN) 80 MG tablet Take 1 tablet (80 mg total) by mouth daily. 90 tablet 3    zolpidem (AMBIEN) 10 MG tablet TAKE 1 TABLET BY MOUTH NIGHTLY AT BEDTIME 30 tablet 1    Allergies  Allergen Reactions   Ezetimibe Other (See Comments)    Joint pain and drops BP   Niacin Other (See Comments)    Drops BP and severe   Statins Other (See Comments)    Severe joint pain and drops BP.    Social History   Tobacco Use   Smoking status: Former    Packs/day: 2.00    Years: 20.00    Total pack years: 40.00    Types: Cigarettes    Quit date: 05/07/1999    Years since quitting: 22.5   Smokeless tobacco: Never  Substance Use Topics   Alcohol use: Not Currently    Comment: 2-3 per month (occasionally)    Family History  Problem Relation Age of Onset   Breast cancer Mother    Coronary artery disease Mother    Stroke Father    Heart attack Father    Diabetes Father    Heart attack Brother    Heart disease Brother    Heart attack Maternal Grandfather      Review of Systems  All other systems reviewed and are negative.   Objective:  Physical Exam Vitals reviewed.  Constitutional:      Appearance: Normal appearance.  HENT:     Head: Normocephalic and atraumatic.  Eyes:     Extraocular Movements: Extraocular movements intact.     Pupils: Pupils are equal, round, and reactive to light.  Neck:     Vascular: No carotid bruit.  Cardiovascular:     Rate and Rhythm: Normal rate and regular rhythm.     Pulses: Normal pulses.     Heart sounds: Normal heart sounds. No murmur heard.    No friction rub. No gallop.  Pulmonary:     Effort: Pulmonary effort is normal.     Breath  sounds: Normal breath sounds.  Musculoskeletal:        General: Swelling and tenderness (medial and lateral joint line) present.     Cervical back: Normal range of motion and neck supple. No tenderness.     Comments: No laxity with varus or valgus movement   Skin:  General: Skin is warm and dry.     Capillary Refill: Capillary refill takes less than 2 seconds.  Neurological:     General: No focal deficit present.     Mental Status: He is alert and oriented to person, place, and time.  Psychiatric:        Mood and Affect: Mood normal.        Behavior: Behavior normal.        Thought Content: Thought content normal.        Judgment: Judgment normal.     Vital signs in last 24 hours: BP: ()/()  Arterial Line BP: ()/()   Labs:   Estimated body mass index is 29.53 kg/m as calculated from the following:   Height as of 10/08/21: 6\' 2"  (1.88 m).   Weight as of 10/08/21: 104.3 kg.   Imaging Review Plain radiographs demonstrate severe degenerative joint disease of the right knee(s). The overall alignment ismild varus. The bone quality appears to be good for age and reported activity level.      Assessment/Plan:  End stage arthritis, right knee   The patient history, physical examination, clinical judgment of the provider and imaging studies are consistent with end stage degenerative joint disease of the right knee(s) and total knee arthroplasty is deemed medically necessary. The treatment options including medical management, injection therapy arthroscopy and arthroplasty were discussed at length. The risks and benefits of total knee arthroplasty were presented and reviewed. The risks due to aseptic loosening, infection, stiffness, patella tracking problems, thromboembolic complications and other imponderables were discussed. The patient acknowledged the explanation, agreed to proceed with the plan and consent was signed. Patient is being admitted for inpatient treatment for  surgery, pain control, PT, OT, prophylactic antibiotics, VTE prophylaxis, progressive ambulation and ADL's and discharge planning. The patient is planning to be discharged  home and will be doing out patient PT     Patient's anticipated LOS is less than 2 midnights, meeting these requirements: - Younger than 70 - Lives within 1 hour of care - Has a competent adult at home to recover with post-op recover - NO history of  - Chronic pain requiring opiods  - Diabetes  - Coronary Artery Disease  - Heart failure  - Heart attack  - Stroke  - DVT/VTE  - Cardiac arrhythmia  - Respiratory Failure/COPD  - Renal failure  - Anemia  - Advanced Liver disease

## 2021-12-01 NOTE — Progress Notes (Signed)
Surgical orders requested via Epic inbox. 

## 2021-12-02 ENCOUNTER — Encounter: Payer: Self-pay | Admitting: Internal Medicine

## 2021-12-02 NOTE — Progress Notes (Addendum)
PCP - Jimmye Norman, MD Cardiologist - Peter Swaziland, MD LOV 10-08-21    Clearance 10-27-21 Edd Fabian, NP epic  PPM/ICD - ICD Device Orders - On chart Rep Notified -   Chest x-ray - CTA chest 06-23-21 epic EKG - 07-04-21 epic Stress Test -  ECHO - 06-16-21 Cardiac Cath - 06-24-21 epic Last device check 10-15-21 epic  Sleep Study -  CPAP -   Fasting Blood Sugar -  Checks Blood Sugar _____ times a day  Blood Thinner Instructions:Plavix Hold 5 Days per cardiology     Aspirin Instructions:continue 81 mg  ERAS Protcol - PRE-SURGERY Ensure or G2-   COVID TEST-  COVID vaccine -  Activity--Able to walk a flight of stairs without SOB Anesthesia review: CHF, MI 2019, CABG, ICD, LBBB, HTN  Patient denies shortness of breath, fever, cough and chest pain at PAT appointment   All instructions explained to the patient, with a verbal understanding of the material. Patient agrees to go over the instructions while at home for a better understanding. Patient also instructed to self quarantine after being tested for COVID-19. The opportunity to ask questions was provided.

## 2021-12-02 NOTE — Progress Notes (Signed)
PERIOPERATIVE PRESCRIPTION FOR IMPLANTED CARDIAC DEVICE PROGRAMMING  Patient Information: Name:  [name]  DOB:  [birth year]  MRN:  [id number]    Planned Procedure:  Right total knee arthroplasty  Surgeon: Eugenia Mcalpine  Date of Procedure: 12-17-21  Cautery will be used.  Position during surgery: N/A   Please send documentation back to:  [facility] (Fax # [phone])   Device Information:  Clinic EP Physician:  Sherryl Manges, MD   Device Type:  Defibrillator Manufacturer and Phone #:  [employer]tronic: [id number] Pacemaker Dependent?:  No. Date of Last Device Check:  10/15/2021 Normal Device Function?:  Yes.    Electrophysiologist's Recommendations:  Have magnet available. Provide continuous ECG monitoring when magnet is used or reprogramming is to be performed.  Procedure should not interfere with device function.  No device programming or magnet placement needed.  Per Device Clinic Standing Orders, [name], RN  10:20 AM 12/02/2021

## 2021-12-02 NOTE — Patient Instructions (Addendum)
SURGICAL WAITING ROOM VISITATION Patients having surgery or a procedure may have no more than 2 support people in the waiting area - these visitors may rotate.   Children under the age of 79 must have an adult with them who is not the patient. If the patient needs to stay at the hospital during part of their recovery, the visitor guidelines for inpatient rooms apply. Pre-op nurse will coordinate an appropriate time for 1 support person to accompany patient in pre-op.  This support person may not rotate.    Please refer to the [facility] website for the visitor guidelines for Inpatients (after your surgery is over and you are in a regular room).       Your procedure is scheduled on: 12-17-21    Report to [facility] Main Entrance    Report to admitting at          0600 AM   Call this number if you have problems the morning of surgery [phone]   Do not eat food :After Midnight.   After Midnight you may have the following liquids until _0530_____ AM DAY OF SURGERY  THEN NOTHING BY MOUTH  Water Non-Citrus Juices (without pulp, NO RED) Carbonated Beverages Black Coffee (NO MILK/CREAM OR CREAMERS, sugar ok)  Clear Tea (NO MILK/CREAM OR CREAMERS, sugar ok) regular and decaf                             Plain Jell-O (NO RED)                                           Fruit ices (not with fruit pulp, NO RED)                                     Popsicles (NO RED)                                                               Sports drinks like Gatorade (NO RED)                   The day of surgery:  Drink ONE (1) Pre-Surgery Clear Ensure at   0515 AM the morning of surgery. Drink in one sitting. Do not sip.  This drink was given to you during your hospital  pre-op appointment visit. Nothing else to drink after completing the  Pre-Surgery Clear Ensure or G2.          If you have questions, please contact your surgeon's office.   FOLLOW  ANY ADDITIONAL PRE OP INSTRUCTIONS  YOU RECEIVED FROM YOUR SURGEON'S OFFICE!!!   These are anesthesia recommendations for holding your anticoagulants.  Please contact your prescribing physician to confirm IF it is safe to hold your anticoagulants for this length of time.   Eliquis Apixaban   72 hours   Xarelto Rivaroxaban   72 hours  Plavix Clopidogrel   120 hours  Pletal Cilostazol   120 hours     Oral Hygiene is also important to reduce your risk of  infection.                                    Remember - BRUSH YOUR TEETH THE MORNING OF SURGERY WITH YOUR REGULAR TOOTHPASTE   Do NOT smoke after Midnight   Take these medicines the morning of surgery with A SIP OF WATER: pantoprazole, carvedilol, Tylenol if needed              HOLD JARDIANCE 72 HOURS PRIOR TO PROCEDURE 3   DO NOT TAKE ANY ORAL DIABETIC MEDICATIONS DAY OF YOUR SURGERY  Bring CPAP mask and tubing day of surgery.                              You may not have any metal on your body including hair pins, jewelry, and body piercing             Do not wear lotions, powders, perfumes/cologne, or deodorant              Men may shave face and neck.   Do not bring valuables to the hospital. [facility] IS NOT             RESPONSIBLE   FOR VALUABLES.   Contacts, dentures or bridgework may not be worn into surgery.   Bring small overnight bag day of surgery.   DO NOT BRING YOUR HOME MEDICATIONS TO THE HOSPITAL. PHARMACY WILL DISPENSE MEDICATIONS LISTED ON YOUR MEDICATION LIST TO YOU DURING YOUR ADMISSION IN THE HOSPITAL!    Patients discharged on the day of surgery will not be allowed to drive home.  Someone NEEDS to stay with you for the first 24 hours after anesthesia.                Please read over the following fact sheets you were given: IF YOU HAVE QUESTIONS ABOUT YOUR PRE-OP INSTRUCTIONS PLEASE CALL [phone]   If you received a COVID test during your pre-op visit  it is requested that you wear a mask when out in public, stay away from anyone  that may not be feeling well and notify your surgeon if you develop symptoms. If you test positive for Covid or have been in contact with anyone that has tested positive in the last 10 days please notify you surgeon.    [facility] - Preparing for Surgery Before surgery, you can play an important role.  Because skin is not sterile, your skin needs to be as free of germs as possible.  You can reduce the number of germs on your skin by washing with CHG (chlorahexidine gluconate) soap before surgery.  CHG is an antiseptic cleaner which kills germs and bonds with the skin to continue killing germs even after washing. Please DO NOT use if you have an allergy to CHG or antibacterial soaps.  If your skin becomes reddened/irritated stop using the CHG and inform your nurse when you arrive at Short Stay. Do not shave (including legs and underarms) for at least 48 hours prior to the first CHG shower.  You may shave your face/neck. Please follow these instructions carefully:  1.  Shower with CHG Soap the night before surgery and the  morning of Surgery.  2.  If you choose to wash your hair, wash your hair first as usual with your  normal  shampoo.  3.  After you  shampoo, rinse your hair and body thoroughly to remove the  shampoo.                           4.  Use CHG as you would any other liquid soap.  You can apply chg directly  to the skin and wash                       Gently with a scrungie or clean washcloth.  5.  Apply the CHG Soap to your body ONLY FROM THE NECK DOWN.   Do not use on face/ open                           Wound or open sores. Avoid contact with eyes, ears mouth and genitals (private parts).                       Wash face,  Genitals (private parts) with your normal soap.             6.  Wash thoroughly, paying special attention to the area where your surgery  will be performed.  7.  Thoroughly rinse your body with warm water from the neck down.  8.  DO NOT shower/wash with your normal soap  after using and rinsing off  the CHG Soap.                9.  Pat yourself dry with a clean towel.            10.  Wear clean pajamas.            11.  Place clean sheets on your bed the night of your first shower and do not  sleep with pets. Day of Surgery : Do not apply any lotions/deodorants the morning of surgery.  Please wear clean clothes to the hospital/surgery center.  FAILURE TO FOLLOW THESE INSTRUCTIONS MAY RESULT IN THE CANCELLATION OF YOUR SURGERY PATIENT SIGNATURE_________________________________  NURSE SIGNATURE__________________________________  ________________________________________________________________________  [name]  An incentive spirometer is a tool that can help keep your lungs clear and active. This tool measures how well you are filling your lungs with each breath. Taking long deep breaths may help reverse or decrease the chance of developing breathing (pulmonary) problems (especially infection) following: A long period of time when you are unable to move or be active. BEFORE THE PROCEDURE  If the spirometer includes an indicator to show your best effort, your nurse or respiratory therapist will set it to a desired goal. If possible, sit up straight or lean slightly forward. Try not to slouch. Hold the incentive spirometer in an upright position. INSTRUCTIONS FOR USE  Sit on the edge of your bed if possible, or sit up as far as you can in bed or on a chair. Hold the incentive spirometer in an upright position. Breathe out normally. Place the mouthpiece in your mouth and seal your lips tightly around it. Breathe in slowly and as deeply as possible, raising the piston or the ball toward the top of the column. Hold your breath for 3-5 seconds or for as long as possible. Allow the piston or ball to fall to the bottom of the column. Remove the mouthpiece from your mouth and breathe out normally. Rest for a few seconds and repeat Steps 1 through 7 at  least 10 times every 1-2  hours when you are awake. Take your time and take a few normal breaths between deep breaths. The spirometer may include an indicator to show your best effort. Use the indicator as a goal to work toward during each repetition. After each set of 10 deep breaths, practice coughing to be sure your lungs are clear. If you have an incision (the cut made at the time of surgery), support your incision when coughing by placing a pillow or rolled up towels firmly against it. Once you are able to get out of bed, walk around indoors and cough well. You may stop using the incentive spirometer when instructed by your caregiver.  RISKS AND COMPLICATIONS Take your time so you do not get dizzy or light-headed. If you are in pain, you may need to take or ask for pain medication before doing incentive spirometry. It is harder to take a deep breath if you are having pain. AFTER USE Rest and breathe slowly and easily. It can be helpful to keep track of a log of your progress. Your caregiver can provide you with a simple table to help with this. If you are using the spirometer at home, follow these instructions: SEEK MEDICAL CARE IF:  You are having difficultly using the spirometer. You have trouble using the spirometer as often as instructed. Your pain medication is not giving enough relief while using the spirometer. You develop fever of 100.5 F (38.1 C) or higher. SEEK IMMEDIATE MEDICAL CARE IF:  You cough up bloody sputum that had not been present before. You develop fever of 102 F (38.9 C) or greater. You develop worsening pain at or near the incision site. MAKE SURE YOU:  Understand these instructions. Will watch your condition. Will get help right away if you are not doing well or get worse. Document Released: 05/04/2006 Document Revised: 03/16/2011 Document Reviewed: 07/05/2006 Exit[employer] Patient Information 2014 Exit[employer],  [place].   ________________________________________________________________________ WHAT IS A BLOOD TRANSFUSION? Blood Transfusion Information  A transfusion is the replacement of blood or some of its parts. Blood is made up of multiple cells which provide different functions. Red blood cells carry oxygen and are used for blood loss replacement. White blood cells fight against infection. Platelets control bleeding. Plasma helps clot blood. Other blood products are available for specialized needs, such as hemophilia or other clotting disorders. BEFORE THE TRANSFUSION  Who gives blood for transfusions?  Healthy volunteers who are fully evaluated to make sure their blood is safe. This is blood bank blood. Transfusion therapy is the safest it has ever been in the practice of medicine. Before blood is taken from a donor, a complete history is taken to make sure that person has no history of diseases nor engages in risky social behavior (examples are intravenous drug use or sexual activity with multiple partners). The donor's travel history is screened to minimize risk of transmitting infections, such as malaria. The donated blood is tested for signs of infectious diseases, such as HIV and hepatitis. The blood is then tested to be sure it is compatible with you in order to minimize the chance of a transfusion reaction. If you or a relative donates blood, this is often done in anticipation of surgery and is not appropriate for emergency situations. It takes many days to process the donated blood. RISKS AND COMPLICATIONS Although transfusion therapy is very safe and saves many lives, the main dangers of transfusion include:  Getting an infectious disease. Developing a transfusion reaction. This is an allergic reaction to  something in the blood you were given. Every precaution is taken to prevent this. The decision to have a blood transfusion has been considered carefully by your caregiver before blood is  given. Blood is not given unless the benefits outweigh the risks. AFTER THE TRANSFUSION Right after receiving a blood transfusion, you will usually feel much better and more energetic. This is especially true if your red blood cells have gotten low (anemic). The transfusion raises the level of the red blood cells which carry oxygen, and this usually causes an energy increase. The nurse administering the transfusion will monitor you carefully for complications. HOME CARE INSTRUCTIONS  No special instructions are needed after a transfusion. You may find your energy is better. Speak with your caregiver about any limitations on activity for underlying diseases you may have. SEEK MEDICAL CARE IF:  Your condition is not improving after your transfusion. You develop redness or irritation at the intravenous (IV) site. SEEK IMMEDIATE MEDICAL CARE IF:  Any of the following symptoms occur over the next 12 hours: Shaking chills. You have a temperature by mouth above 102 F (38.9 C), not controlled by medicine. Chest, back, or muscle pain. People around you feel you are not acting correctly or are confused. Shortness of breath or difficulty breathing. Dizziness and fainting. You get a rash or develop hives. You have a decrease in urine output. Your urine turns a dark color or changes to pink, red, or brown. Any of the following symptoms occur over the next 10 days: You have a temperature by mouth above 102 F (38.9 C), not controlled by medicine. Shortness of breath. Weakness after normal activity. The white part of the eye turns yellow (jaundice). You have a decrease in the amount of urine or are urinating less often. Your urine turns a dark color or changes to pink, red, or brown. Document Released: 12/20/1999 Document Revised: 03/16/2011 Document Reviewed: 08/08/2007 [facility] Patient Information 2014 [place], [place].  _______________________________________________________________________

## 2021-12-08 ENCOUNTER — Encounter (HOSPITAL_COMMUNITY): Payer: Self-pay

## 2021-12-08 ENCOUNTER — Encounter (HOSPITAL_COMMUNITY)
Admission: RE | Admit: 2021-12-08 | Discharge: 2021-12-08 | Disposition: A | Discharge status: Home / Self Care | Payer: PPO | Source: Ambulatory Visit | Attending: Specialist | Admitting: Specialist

## 2021-12-08 ENCOUNTER — Other Ambulatory Visit: Payer: Self-pay

## 2021-12-08 ENCOUNTER — Telehealth: Payer: Self-pay

## 2021-12-08 VITALS — BP 125/63 | HR 88 | Temp 98.4°F | Resp 16 | Ht 74.0 in | Wt 229.0 lb

## 2021-12-08 DIAGNOSIS — M1711 Unilateral primary osteoarthritis, right knee: Secondary | ICD-10-CM | POA: Insufficient documentation

## 2021-12-08 DIAGNOSIS — Z01812 Encounter for preprocedural laboratory examination: Secondary | ICD-10-CM | POA: Diagnosis not present

## 2021-12-08 DIAGNOSIS — Z87891 Personal history of nicotine dependence: Secondary | ICD-10-CM | POA: Diagnosis not present

## 2021-12-08 DIAGNOSIS — Z7902 Long term (current) use of antithrombotics/antiplatelets: Secondary | ICD-10-CM | POA: Insufficient documentation

## 2021-12-08 DIAGNOSIS — I2510 Atherosclerotic heart disease of native coronary artery without angina pectoris: Secondary | ICD-10-CM | POA: Diagnosis not present

## 2021-12-08 DIAGNOSIS — Z951 Presence of aortocoronary bypass graft: Secondary | ICD-10-CM | POA: Insufficient documentation

## 2021-12-08 DIAGNOSIS — Z01818 Encounter for other preprocedural examination: Secondary | ICD-10-CM

## 2021-12-08 DIAGNOSIS — Z95810 Presence of automatic (implantable) cardiac defibrillator: Secondary | ICD-10-CM | POA: Diagnosis not present

## 2021-12-08 DIAGNOSIS — E119 Type 2 diabetes mellitus without complications: Secondary | ICD-10-CM | POA: Insufficient documentation

## 2021-12-08 DIAGNOSIS — Z955 Presence of coronary angioplasty implant and graft: Secondary | ICD-10-CM | POA: Insufficient documentation

## 2021-12-08 HISTORY — DX: Prediabetes: R73.03

## 2021-12-08 LAB — COMPREHENSIVE METABOLIC PANEL
ALT: 22 U/L (ref 0–44)
AST: 20 U/L (ref 15–41)
Albumin: 3.8 g/dL (ref 3.5–5.0)
Alkaline Phosphatase: 49 U/L (ref 38–126)
Anion gap: 7 (ref 5–15)
BUN: 15 mg/dL (ref 8–23)
CO2: 29 mmol/L (ref 22–32)
Calcium: 9.4 mg/dL (ref 8.9–10.3)
Chloride: 104 mmol/L (ref 98–111)
Creatinine, Ser: 1.04 mg/dL (ref 0.61–1.24)
GFR, Estimated: >60 mL/min (ref >60)
Glucose, Bld: 87 mg/dL (ref 70–99)
Potassium: 5 mmol/L (ref 3.5–5.1)
Sodium: 140 mmol/L (ref 135–145)
Total Bilirubin: 0.9 mg/dL (ref 0.3–1.2)
Total Protein: 6.8 g/dL (ref 6.5–8.1)

## 2021-12-08 LAB — CBC
HCT: 47.9 % (ref 39.0–52.0)
Hemoglobin: 15.3 g/dL (ref 13.0–17.0)
MCH: 32.1 pg (ref 26.0–34.0)
MCHC: 31.9 g/dL (ref 30.0–36.0)
MCV: 100.6 fL — ABNORMAL HIGH (ref 80.0–100.0)
Platelets: 209 10*3/uL (ref 150–400)
RBC: 4.76 MIL/uL (ref 4.22–5.81)
RDW: 13.6 % (ref 11.5–15.5)
WBC: 7.2 10*3/uL (ref 4.0–10.5)
nRBC: 0 % (ref 0.0–0.2)

## 2021-12-08 LAB — GLUCOSE, CAPILLARY: Glucose-Capillary: 101 mg/dL — ABNORMAL HIGH (ref 70–99)

## 2021-12-08 LAB — SURGICAL PCR SCREEN
MRSA, PCR: NEGATIVE
Staphylococcus aureus: NEGATIVE

## 2021-12-08 NOTE — Telephone Encounter (Signed)
The patient left a voicemail stating his doctor has a question about his device before his procedure. He would like for the nurse to give him a call back.

## 2021-12-08 NOTE — Telephone Encounter (Signed)
Patient reports he had pre-op apt and was advised by staff he had a piece of what looks like fishing wire sticking out of skin near device. Wound check appointment made for Wednesday 12/10/21 @ 2:40 pm.

## 2021-12-09 ENCOUNTER — Other Ambulatory Visit: Payer: Self-pay | Admitting: Family Medicine

## 2021-12-09 LAB — HEMOGLOBIN A1C
Hgb A1c MFr Bld: 6.4 % — ABNORMAL HIGH (ref 4.8–5.6)
Mean Plasma Glucose: 137 mg/dL

## 2021-12-09 NOTE — Telephone Encounter (Signed)
No refill at this time.Ambien was last filled on 12/01/2021 by Dr.Miller according.

## 2021-12-09 NOTE — Telephone Encounter (Signed)
Patient has request refill on medication Ambien. Patient medication last refilled 11/01/2021. Patient has NO contract on file. Patient doesn't have any upcoming appointments. Medication pend and sent to Richarda Blade, NP due to PCP Hyacinth Meeker Bertram Millard, MD being out of office.

## 2021-12-10 ENCOUNTER — Ambulatory Visit: Payer: PPO | Attending: Interventional Cardiology

## 2021-12-10 DIAGNOSIS — I5022 Chronic systolic (congestive) heart failure: Secondary | ICD-10-CM

## 2021-12-10 NOTE — Patient Instructions (Signed)
Follow up per recall. 

## 2021-12-10 NOTE — Progress Notes (Signed)
Pt seen in clinic to evaluate device site d/t concerns of "wire poking out".  Site assessed with AT.  Appears to be a skin tag.  Skin tag removed.  No further action needed.

## 2021-12-11 NOTE — Anesthesia Preprocedure Evaluation (Addendum)
Anesthesia Evaluation  Patient identified by MRN, date of birth, ID band Patient awake    Reviewed: Allergy & Precautions, NPO status , Patient's Chart, lab work & pertinent test results  History of Anesthesia Complications Negative for: history of anesthetic complications  Airway Mallampati: II  TM Distance: >3 FB Neck ROM: Full    Dental  (+) Teeth Intact, Dental Advisory Given   Pulmonary neg shortness of breath, neg sleep apnea, neg COPD, neg recent URI, former smoker   breath sounds clear to auscultation       Cardiovascular hypertension, + CAD, + Past MI, + CABG and +CHF  + dysrhythmias + Cardiac Defibrillator  Rhythm:Regular    -------------- NATIVE CORONARIES------------   Prox RCA lesion is 90% stenosed.  Dist RCA lesion is 90% stenosed.   Ost LM to Mid LM lesion is 40% stenosed.   Prox LAD lesion is 100% stenosed prior to D1. Prox LAD to Mid LAD lesion is 100% stenosed following D1.   CULPRIT LESION: Ramus lesion is 75% stenosed.  (Borderline positive by RFR (0.91) and FFR 0.80) -> given the patient's symptoms, this is felt to be significant.   A drug-eluting stent was successfully placed using a SYNERGY XD 3.0X16 -> deployed to 3.3 mm.   Post intervention, there is a 0% residual stenosis.   -------------- GRAFTS -------------------------------   LIMA-LAD graft was visualized by angiography and is normal in caliber. The graft exhibits no disease. There is no competitive flow   SVG-D1 graft was visualized by angiography and is normal in caliber. The graft exhibits no disease, and the flow is not reversed.  There is retrograde filling of a major septal perforator trunk in the LAD that it would otherwise not be perfused by the native LAD or retrograde filling from the LIMA..   SVG-rPDA graft was visualized by angiography and is moderate in size.  The graft exhibits no disease. There is trivial competitive flow    SVG-RI graft was visualized by angiography, Origin lesion is 100% stenosed.   -------------- HEMODYNAMICS-----------------   LV end diastolic pressure is normal.   There is no aortic valve stenosis.   POST-OPERATIVE DIAGNOSIS:    Stable Three-Vessel CAD: Ostial mid OM 40%.  Proximal to mid LAD 100%.  Proximal RCA 90% at bend with trace filling of the PDA and PL.  Patent LCx that is small and nondominant.    Culprit lesion: Progression of proximal Ramus Intermedius (RI) lesion from 60% to 70-80% -> RFR 0.90, 0.91 with a FFR of 0.80.  Based on his symptoms thought to be significant.  Successful DES PCI of ostial and proximal L RI using Synergy DES 3.0 mm x 16 mm deployed to 3.3 mm.  Reducing lesion from 70-80% to 0% TIMI-3 flow pre and post.    Known occluded SVG-RI with otherwise patent SVG-PDA, SVG-D1, and LIMA-LAD  RHC pressures relatively normal:   RAP 2 mmHg, RVP-EDP 28/0-0 mmHg; PAP-mean 19/10-15 mmHg, PCWP 16 mmHg; LVP-EDP 120/1-12 mmHg,   AoP-MAP 116/54 mmHg - 72 mmHg; Ao sat 98%, PA sat 69%  Cardiac Output-Index: Fick 5.32-2.33; TD 3.21-1.4 (suggestive of valvular disease.   PLAN OF CARE:  Return to nursing floor for post PCI care after sheath removal. ->  Dual and platelet therapy for 6 months, then okay to stop aspirin.  Seems relatively euvolemic on right heart cath.        1. Left ventricular ejection fraction, by estimation, is 30 to 35%. Left  ventricular ejection fraction by  3D volume is 31 %. The left ventricle has  moderately decreased function. The left ventricle demonstrates global  hypokinesis. The left ventricular  internal cavity size was moderately dilated. Left ventricular diastolic  parameters are consistent with Grade I diastolic dysfunction (impaired  relaxation).   2. Right ventricular systolic function is mildly reduced. The right  ventricular size is normal. There is normal pulmonary artery systolic  pressure. The estimated right ventricular  systolic pressure is 41.6 mmHg.   3. Left atrial size was mild to moderately dilated.   4. The mitral valve is grossly normal. Mild mitral valve regurgitation.  No evidence of mitral stenosis.   5. The aortic valve is tricuspid. Aortic valve regurgitation is mild. No  aortic stenosis is present.   6. There is mild dilatation of the ascending aorta, measuring 41 mm.   7. The inferior vena cava is normal in size with greater than 50%  respiratory variability, suggesting right atrial pressure of 3 mmHg.      Neuro/Psych  PSYCHIATRIC DISORDERS Anxiety      Neuromuscular disease    GI/Hepatic Neg liver ROS,GERD  ,,  Endo/Other  negative endocrine ROS    Renal/GU negative Renal ROS     Musculoskeletal  (+) Arthritis ,    Abdominal   Peds  Hematology Plavix last took 12/5  Lab Results      Component                Value               Date                      WBC                      7.2                 12/08/2021                HGB                      15.3                12/08/2021                HCT                      47.9                12/08/2021                MCV                      100.6 (H)           12/08/2021                PLT                      209                 12/08/2021              Anesthesia Other Findings   Reproductive/Obstetrics                             Anesthesia Physical Anesthesia Plan  ASA: 3  Anesthesia Plan:  MAC, Regional and Spinal   Post-op Pain Management: Toradol IV (intra-op)* and Ofirmev IV (intra-op)*   Induction: Intravenous  PONV Risk Score and Plan: 1 and Propofol infusion and Treatment may vary due to age or medical condition  Airway Management Planned: Nasal Cannula and Natural Airway  Additional Equipment: None  Intra-op Plan:   Post-operative Plan:   Informed Consent: I have reviewed the patients History and Physical, chart, labs and discussed the procedure including the risks, benefits  and alternatives for the proposed anesthesia with the patient or authorized representative who has indicated his/her understanding and acceptance.     Dental advisory given  Plan Discussed with: CRNA  Anesthesia Plan Comments: (See PAT note 12/08/2021)       Anesthesia Quick Evaluation

## 2021-12-11 NOTE — Progress Notes (Addendum)
Anesthesia Chart Review   Case: [id number] Date/Time: 12/17/21 0815   Procedure: TOTAL KNEE ARTHROPLASTY (Right: Knee) - adductor canal  120   Anesthesia type: Spinal   Pre-op diagnosis: Right knee osteoarthritis   Location: [facility] ROOM 01 / [facility]   Surgeons: Eugenia Mcalpine, MD       DISCUSSION:66 y.o. former smoker with h/o HTN, LBBB, CAD (CABG 2019), CHF, AICD in place (device orders in 12/02/2021 progress note), right kne eOA scheduled for above procedure 12/17/2021 with Dr. Eugenia Mcalpine.   Per cardiology preoperative evaluation, "Chart reviewed as part of pre-operative protocol coverage. Given past medical history and time since last visit, based on ACC/AHA guidelines, [name] would be at acceptable risk for the planned procedure without further cardiovascular testing.    His Plavix may be held for 5 days prior to his surgery.  His aspirin will need to be continued throughout his procedure.  Please resume his Plavix as soon as hemostasis is achieved.   His RCRI is a class III risk, 6.6% risk of major cardiac event."  Pt reports last dose of Plavix morning of 12/11/2021.   Anticipate pt can proceed with planned procedure barring acute status change.   VS: BP 125/63   Pulse 88   Temp 36.9 C (Oral)   Resp 16   Ht 6\' 2"  (1.88 m)   Wt 103.9 kg   SpO2 95%   BMI 29.40 kg/m   PROVIDERS: , MD is PCP   Cardiologist - Peter Frederica Kuster, MD  LABS: Labs reviewed: Acceptable for surgery. (all labs ordered are listed, but only abnormal results are displayed)  Labs Reviewed  CBC - Abnormal; Notable for the following components:      Result Value   MCV 100.6 (*)    All other components within normal limits  HEMOGLOBIN A1C - Abnormal; Notable for the following components:   Hgb A1c MFr Bld 6.4 (*)    All other components within normal limits  GLUCOSE, CAPILLARY - Abnormal; Notable for the following components:   Glucose-Capillary 101 (*)    All other components  within normal limits  SURGICAL PCR SCREEN  COMPREHENSIVE METABOLIC PANEL  TYPE AND SCREEN     IMAGES:   EKG:   CV: Echo 06/16/2021 1. Left ventricular ejection fraction, by estimation, is 30 to 35%. Left  ventricular ejection fraction by 3D volume is 31 %. The left ventricle has  moderately decreased function. The left ventricle demonstrates global  hypokinesis. The left ventricular  internal cavity size was moderately dilated. Left ventricular diastolic  parameters are consistent with Grade I diastolic dysfunction (impaired  relaxation).   2. Right ventricular systolic function is mildly reduced. The right  ventricular size is normal. There is normal pulmonary artery systolic  pressure. The estimated right ventricular systolic pressure is 33.2 mmHg.   3. Left atrial size was mild to moderately dilated.   4. The mitral valve is grossly normal. Mild mitral valve regurgitation.  No evidence of mitral stenosis.   5. The aortic valve is tricuspid. Aortic valve regurgitation is mild. No  aortic stenosis is present.   6. There is mild dilatation of the ascending aorta, measuring 41 mm.   7. The inferior vena cava is normal in size with greater than 50%  respiratory variability, suggesting right atrial pressure of 3 mmHg.  Past Medical History:  Diagnosis Date   AICD (automatic cardioverter/defibrillator) present    Anxiety    Arthritis    "mild;  back, neck, spine" (06/09/2017)   CHF (congestive heart failure) (HCC)    Chronic back pain    Coronary artery disease    GERD (gastroesophageal reflux disease)    History of gout    History of kidney stones X 2   Hypercholesterolemia    Hypertension    LBBB (left bundle branch block)    MI (myocardial infarction) (HCC) 1993   LATERAL   MI (myocardial infarction) (HCC) ?09/2001; 06/07/2017   Pneumonia    "several times" (06/09/2017)   Pre-diabetes    S/P coronary artery stent placement    RCA   Varicose veins     Past Surgical  History:  Procedure Laterality Date   ANTERIOR CERVICAL DECOMP/DISCECTOMY FUSION  03/03/2011   Procedure: ANTERIOR CERVICAL DECOMPRESSION/DISCECTOMY FUSION 3 LEVELS;  Surgeon: Karn Cassis, MD;  Location: [facility];  Service: Neurosurgery;  Laterality: N/A;  Cervical three-four Cervical four-five Cervical five-six Anterior cervical decompression/diskectomy, fusion   APPLICATION OF ROBOTIC ASSISTANCE FOR SPINAL PROCEDURE N/A 10/03/2020   Procedure: APPLICATION OF ROBOTIC ASSISTANCE FOR SPINAL PROCEDURE;  Surgeon: Lisbeth Renshaw, MD;  Location: [facility] OR;  Service: Neurosurgery;  Laterality: N/A;   BACK SURGERY     CARDIAC CATHETERIZATION  2009   Stents in RCA patent. 70 to 80% PL, and 50% ostial PD.    CARDIAC CATHETERIZATION N/A 11/20/2014   Procedure: Left Heart Cath and Coronary Angiography;  Surgeon: Peter M Swaziland, MD;  Location: [facility] INVASIVE CV LAB;  Service: Cardiovascular;  Laterality: N/A;   CORONARY ANGIOPLASTY     DIRECT ANGIOPLASTY THE MARGINAL BRANCH   CORONARY ANGIOPLASTY WITH STENT PLACEMENT     RCA   CORONARY ARTERY BYPASS GRAFT N/A 06/14/2017   Procedure: CORONARY ARTERY BYPASS GRAFTING (CABG) x four, using left internal mammary artery and right leg greater saphenous vein harvested endoscopically;  Surgeon: Kerin Perna, MD;  Location: [facility] OR;  Service: Open Heart Surgery;  Laterality: N/A;   CORONARY STENT INTERVENTION N/A 06/24/2021   Procedure: CORONARY STENT INTERVENTION;  Surgeon: Marykay Lex, MD;  Location: [facility] INVASIVE CV LAB;  Service: Cardiovascular;  Laterality: N/A;   CYSTOSCOPY N/A 06/14/2017   Procedure: CYSTOSCOPY;  Surgeon: Kerin Perna, MD;  Location: [facility] OR;  Service: Open Heart Surgery;  Laterality: N/A;   ICD IMPLANT N/A 03/11/2018   Procedure: ICD IMPLANT - Dual Chamber;  Surgeon: [name], MD;  Location: [facility] INVASIVE CV LAB;  Service: Cardiovascular;  Laterality: N/A;   INSERTION OF SUPRAPUBIC CATHETER N/A 06/14/2017   Procedure: INSERTION OF  SUPRAPUBIC CATHETER - Lower abdomen;  Surgeon: Kerin Perna, MD;  Location: [facility];  Service: Open Heart Surgery;  Laterality: N/A;   INTRAVASCULAR PRESSURE WIRE/FFR STUDY N/A 06/24/2021   Procedure: INTRAVASCULAR PRESSURE WIRE/FFR STUDY;  Surgeon: Marykay Lex, MD;  Location: [facility] INVASIVE CV LAB;  Service: Cardiovascular;  Laterality: N/A;   POSTERIOR LUMBAR FUSION  10/2011   RIGHT/LEFT HEART CATH AND CORONARY ANGIOGRAPHY N/A 06/08/2017   Procedure: RIGHT/LEFT HEART CATH AND CORONARY ANGIOGRAPHY;  Surgeon: Swaziland, Peter M, MD;  Location: [facility] INVASIVE CV LAB;  Service: Cardiovascular;  Laterality: N/A;   RIGHT/LEFT HEART CATH AND CORONARY/GRAFT ANGIOGRAPHY N/A 09/06/2020   Procedure: RIGHT/LEFT HEART CATH AND CORONARY/GRAFT ANGIOGRAPHY;  Surgeon: Swaziland, Peter M, MD;  Location: [facility] INVASIVE CV LAB;  Service: Cardiovascular;  Laterality: N/A;   RIGHT/LEFT HEART CATH AND CORONARY/GRAFT ANGIOGRAPHY N/A 06/24/2021   Procedure: RIGHT/LEFT HEART CATH AND CORONARY/GRAFT ANGIOGRAPHY;  Surgeon: Marykay Lex, MD;  Location: [facility] INVASIVE CV LAB;  Service: Cardiovascular;  Laterality: N/A;   TEE WITHOUT CARDIOVERSION N/A 06/14/2017   Procedure: TRANSESOPHAGEAL ECHOCARDIOGRAM (TEE);  Surgeon: Donata Clay, Theron Arista, MD;  Location: [facility] OR;  Service: Open Heart Surgery;  Laterality: N/A;   URETHROPLASTY N/A 10/15/2017   Procedure: URETHROPLASTY WITH BUCCAL GRAFT HARVAST;  Surgeon: Crist Fat, MD;  Location: [facility];  Service: Urology;  Laterality: N/A;    MEDICATIONS:  acetaminophen (TYLENOL) 500 MG tablet   Aromatic Inhalants (VICKS VAPOINHALER IN)   Ascorbic Acid (SUPER C COMPLEX PO)   aspirin 81 MG EC tablet   carvedilol (COREG) 6.25 MG tablet   cetirizine (ZYRTEC) 10 MG tablet   clopidogrel (PLAVIX) 75 MG tablet   esomeprazole (NEXIUM) 20 MG capsule   furosemide (LASIX) 40 MG tablet   Misc Natural Products (DEEP SLEEP) CAPS   Multiple Vitamins-Minerals (AIRBORNE GUMMIES PO)   nitroGLYCERIN  (NITROSTAT) 0.4 MG SL tablet   pantoprazole (PROTONIX) 40 MG tablet   REPATHA SURECLICK 140 MG/ML SOAJ   valsartan (DIOVAN) 80 MG tablet   zolpidem (AMBIEN) 10 MG tablet   No current facility-administered medications for this encounter.    Jodell Cipro Ward, PA-C [facility]Surgical Testing [phone]

## 2021-12-15 NOTE — Telephone Encounter (Signed)
Please refuse medication so it can clear from inbasket.

## 2021-12-16 ENCOUNTER — Other Ambulatory Visit: Payer: Self-pay | Admitting: Family Medicine

## 2021-12-17 ENCOUNTER — Encounter (HOSPITAL_COMMUNITY): Payer: Self-pay | Admitting: Specialist

## 2021-12-17 ENCOUNTER — Ambulatory Visit (HOSPITAL_BASED_OUTPATIENT_CLINIC_OR_DEPARTMENT_OTHER): Payer: PPO | Admitting: Anesthesiology

## 2021-12-17 ENCOUNTER — Other Ambulatory Visit: Payer: Self-pay

## 2021-12-17 ENCOUNTER — Encounter (HOSPITAL_COMMUNITY)
Admission: AD | Disposition: A | Discharge status: Home / Self Care | Payer: Self-pay | Source: Home / Self Care | Attending: Specialist

## 2021-12-17 ENCOUNTER — Ambulatory Visit (HOSPITAL_COMMUNITY): Payer: PPO | Admitting: Physician Assistant

## 2021-12-17 ENCOUNTER — Inpatient Hospital Stay (HOSPITAL_COMMUNITY)
Admission: AD | Admit: 2021-12-17 | Discharge: 2021-12-22 | DRG: 470 | Disposition: A | Discharge status: Home / Self Care | Payer: PPO | Attending: Specialist | Admitting: Specialist

## 2021-12-17 DIAGNOSIS — Z7984 Long term (current) use of oral hypoglycemic drugs: Secondary | ICD-10-CM | POA: Diagnosis not present

## 2021-12-17 DIAGNOSIS — I110 Hypertensive heart disease with heart failure: Secondary | ICD-10-CM | POA: Diagnosis present

## 2021-12-17 DIAGNOSIS — Z6829 Body mass index (BMI) 29.0-29.9, adult: Secondary | ICD-10-CM | POA: Diagnosis not present

## 2021-12-17 DIAGNOSIS — Z87891 Personal history of nicotine dependence: Secondary | ICD-10-CM | POA: Diagnosis not present

## 2021-12-17 DIAGNOSIS — Z8249 Family history of ischemic heart disease and other diseases of the circulatory system: Secondary | ICD-10-CM | POA: Diagnosis not present

## 2021-12-17 DIAGNOSIS — I2510 Atherosclerotic heart disease of native coronary artery without angina pectoris: Secondary | ICD-10-CM | POA: Diagnosis present

## 2021-12-17 DIAGNOSIS — Z79899 Other long term (current) drug therapy: Secondary | ICD-10-CM

## 2021-12-17 DIAGNOSIS — Z888 Allergy status to other drugs, medicaments and biological substances status: Secondary | ICD-10-CM | POA: Diagnosis not present

## 2021-12-17 DIAGNOSIS — I5022 Chronic systolic (congestive) heart failure: Secondary | ICD-10-CM | POA: Diagnosis present

## 2021-12-17 DIAGNOSIS — I509 Heart failure, unspecified: Secondary | ICD-10-CM

## 2021-12-17 DIAGNOSIS — M1711 Unilateral primary osteoarthritis, right knee: Secondary | ICD-10-CM | POA: Diagnosis present

## 2021-12-17 DIAGNOSIS — Z95810 Presence of automatic (implantable) cardiac defibrillator: Secondary | ICD-10-CM | POA: Diagnosis not present

## 2021-12-17 DIAGNOSIS — Z981 Arthrodesis status: Secondary | ICD-10-CM

## 2021-12-17 DIAGNOSIS — G4700 Insomnia, unspecified: Secondary | ICD-10-CM | POA: Diagnosis present

## 2021-12-17 DIAGNOSIS — I252 Old myocardial infarction: Secondary | ICD-10-CM

## 2021-12-17 DIAGNOSIS — E7800 Pure hypercholesterolemia, unspecified: Secondary | ICD-10-CM | POA: Diagnosis present

## 2021-12-17 DIAGNOSIS — Z803 Family history of malignant neoplasm of breast: Secondary | ICD-10-CM

## 2021-12-17 DIAGNOSIS — K219 Gastro-esophageal reflux disease without esophagitis: Secondary | ICD-10-CM | POA: Diagnosis present

## 2021-12-17 DIAGNOSIS — E669 Obesity, unspecified: Secondary | ICD-10-CM | POA: Diagnosis present

## 2021-12-17 DIAGNOSIS — G8918 Other acute postprocedural pain: Secondary | ICD-10-CM | POA: Diagnosis not present

## 2021-12-17 DIAGNOSIS — Z7982 Long term (current) use of aspirin: Secondary | ICD-10-CM | POA: Diagnosis not present

## 2021-12-17 DIAGNOSIS — Z7902 Long term (current) use of antithrombotics/antiplatelets: Secondary | ICD-10-CM | POA: Diagnosis not present

## 2021-12-17 DIAGNOSIS — Z951 Presence of aortocoronary bypass graft: Secondary | ICD-10-CM | POA: Diagnosis not present

## 2021-12-17 DIAGNOSIS — Z955 Presence of coronary angioplasty implant and graft: Secondary | ICD-10-CM

## 2021-12-17 DIAGNOSIS — Z823 Family history of stroke: Secondary | ICD-10-CM

## 2021-12-17 DIAGNOSIS — Z833 Family history of diabetes mellitus: Secondary | ICD-10-CM

## 2021-12-17 DIAGNOSIS — Z01818 Encounter for other preprocedural examination: Principal | ICD-10-CM

## 2021-12-17 HISTORY — PX: TOTAL KNEE ARTHROPLASTY: SHX125

## 2021-12-17 LAB — CBC
HCT: 40.9 % (ref 39.0–52.0)
Hemoglobin: 13.3 g/dL (ref 13.0–17.0)
MCH: 32.8 pg (ref 26.0–34.0)
MCHC: 32.5 g/dL (ref 30.0–36.0)
MCV: 101 fL — ABNORMAL HIGH (ref 80.0–100.0)
Platelets: 177 10*3/uL (ref 150–400)
RBC: 4.05 MIL/uL — ABNORMAL LOW (ref 4.22–5.81)
RDW: 12.9 % (ref 11.5–15.5)
WBC: 8.2 10*3/uL (ref 4.0–10.5)
nRBC: 0 % (ref 0.0–0.2)

## 2021-12-17 LAB — TYPE AND SCREEN
ABO/RH(D): AB POS
Antibody Screen: NEGATIVE

## 2021-12-17 LAB — BASIC METABOLIC PANEL
Anion gap: 8 (ref 5–15)
BUN: 20 mg/dL (ref 8–23)
CO2: 27 mmol/L (ref 22–32)
Calcium: 8.5 mg/dL — ABNORMAL LOW (ref 8.9–10.3)
Chloride: 104 mmol/L (ref 98–111)
Creatinine, Ser: 0.95 mg/dL (ref 0.61–1.24)
GFR, Estimated: >60 mL/min (ref >60)
Glucose, Bld: 159 mg/dL — ABNORMAL HIGH (ref 70–99)
Potassium: 4.1 mmol/L (ref 3.5–5.1)
Sodium: 139 mmol/L (ref 135–145)

## 2021-12-17 LAB — HIV ANTIBODY (ROUTINE TESTING W REFLEX): HIV Screen 4th Generation wRfx: NONREACTIVE

## 2021-12-17 LAB — GLUCOSE, CAPILLARY: Glucose-Capillary: 143 mg/dL — ABNORMAL HIGH (ref 70–99)

## 2021-12-17 SURGERY — ARTHROPLASTY, KNEE, TOTAL
Anesthesia: Monitor Anesthesia Care | Site: Knee | Laterality: Right

## 2021-12-17 MED ORDER — OXYCODONE HCL 5 MG PO TABS
10.0000 mg | ORAL_TABLET | ORAL | Status: DC | PRN
  Administered 2021-12-17: 10 mg via ORAL
  Administered 2021-12-17 – 2021-12-19 (×8): 15 mg via ORAL
  Administered 2021-12-20: 10 mg via ORAL
  Administered 2021-12-20: 15 mg via ORAL
  Filled 2021-12-17 (×11): qty 3

## 2021-12-17 MED ORDER — BISACODYL 5 MG PO TBEC
5.0000 mg | DELAYED_RELEASE_TABLET | Freq: Every day | ORAL | Status: DC | PRN
  Administered 2021-12-22: 5 mg via ORAL
  Filled 2021-12-17: qty 1

## 2021-12-17 MED ORDER — SODIUM CHLORIDE (PF) 0.9 % IJ SOLN
INTRAMUSCULAR | Status: AC
  Filled 2021-12-17: qty 50

## 2021-12-17 MED ORDER — SODIUM CHLORIDE 0.9 % IR SOLN
Status: DC | PRN
  Administered 2021-12-17: 1000 mL

## 2021-12-17 MED ORDER — ONDANSETRON HCL 4 MG PO TABS: 4.0000 mg | ORAL_TABLET | Freq: Four times a day (QID) | ORAL | Status: DC | PRN

## 2021-12-17 MED ORDER — SODIUM CHLORIDE 0.45 % IV BOLUS
500.0000 mL | Freq: Once | INTRAVENOUS | Status: AC
  Administered 2021-12-17: 500 mL via INTRAVENOUS

## 2021-12-17 MED ORDER — ACETAMINOPHEN 160 MG/5ML PO SOLN: 1000.0000 mg | Freq: Once | ORAL | Status: DC | PRN

## 2021-12-17 MED ORDER — CLOPIDOGREL BISULFATE 75 MG PO TABS
75.0000 mg | ORAL_TABLET | Freq: Every day | ORAL | Status: DC
  Administered 2021-12-18 – 2021-12-22 (×5): 75 mg via ORAL
  Filled 2021-12-17 (×5): qty 1

## 2021-12-17 MED ORDER — CEFAZOLIN SODIUM-DEXTROSE 2-4 GM/100ML-% IV SOLN
2.0000 g | INTRAVENOUS | Status: AC
  Administered 2021-12-17: 2 g via INTRAVENOUS
  Filled 2021-12-17: qty 100

## 2021-12-17 MED ORDER — PHENYLEPHRINE HCL-NACL 20-0.9 MG/250ML-% IV SOLN
INTRAVENOUS | Status: DC | PRN
  Administered 2021-12-17: 50 ug/min via INTRAVENOUS

## 2021-12-17 MED ORDER — FUROSEMIDE 40 MG PO TABS
40.0000 mg | ORAL_TABLET | Freq: Every day | ORAL | Status: DC
  Administered 2021-12-17 – 2021-12-22 (×6): 40 mg via ORAL
  Filled 2021-12-17 (×6): qty 1

## 2021-12-17 MED ORDER — ACETAMINOPHEN 325 MG PO TABS
325.0000 mg | ORAL_TABLET | Freq: Four times a day (QID) | ORAL | Status: DC | PRN
  Administered 2021-12-19 – 2021-12-22 (×6): 650 mg via ORAL
  Filled 2021-12-17 (×6): qty 2

## 2021-12-17 MED ORDER — PROPOFOL 10 MG/ML IV BOLUS
INTRAVENOUS | Status: AC
  Filled 2021-12-17: qty 20

## 2021-12-17 MED ORDER — LACTATED RINGERS IV SOLN: INTRAVENOUS | Status: DC

## 2021-12-17 MED ORDER — OXYCODONE HCL 5 MG/5ML PO SOLN: 5.0000 mg | Freq: Once | ORAL | Status: DC | PRN

## 2021-12-17 MED ORDER — TRAMADOL HCL 50 MG PO TABS
50.0000 mg | ORAL_TABLET | Freq: Four times a day (QID) | ORAL | Status: DC
  Administered 2021-12-17 – 2021-12-22 (×19): 50 mg via ORAL
  Filled 2021-12-17 (×19): qty 1

## 2021-12-17 MED ORDER — MIDAZOLAM HCL 5 MG/5ML IJ SOLN
INTRAMUSCULAR | Status: DC | PRN
  Administered 2021-12-17: 2 mg via INTRAVENOUS

## 2021-12-17 MED ORDER — METHOCARBAMOL 500 MG IVPB - SIMPLE MED: 500.0000 mg | Freq: Four times a day (QID) | INTRAVENOUS | Status: DC | PRN

## 2021-12-17 MED ORDER — SODIUM CHLORIDE (PF) 0.9 % IJ SOLN
INTRAMUSCULAR | Status: DC | PRN
  Administered 2021-12-17: 60 mL

## 2021-12-17 MED ORDER — ACETAMINOPHEN 500 MG PO TABS: 1000.0000 mg | ORAL_TABLET | Freq: Once | ORAL | Status: DC | PRN

## 2021-12-17 MED ORDER — PHENYLEPHRINE HCL-NACL 20-0.9 MG/250ML-% IV SOLN
INTRAVENOUS | Status: AC
  Filled 2021-12-17: qty 250

## 2021-12-17 MED ORDER — PANTOPRAZOLE SODIUM 40 MG PO TBEC
40.0000 mg | DELAYED_RELEASE_TABLET | Freq: Every day | ORAL | Status: DC
  Administered 2021-12-17 – 2021-12-22 (×6): 40 mg via ORAL
  Filled 2021-12-17 (×7): qty 1

## 2021-12-17 MED ORDER — CEFAZOLIN SODIUM-DEXTROSE 1-4 GM/50ML-% IV SOLN
1.0000 g | Freq: Four times a day (QID) | INTRAVENOUS | Status: AC
  Administered 2021-12-17 – 2021-12-18 (×3): 1 g via INTRAVENOUS
  Filled 2021-12-17 (×3): qty 50

## 2021-12-17 MED ORDER — ORAL CARE MOUTH RINSE: 15.0000 mL | Freq: Once | OROMUCOSAL | Status: AC

## 2021-12-17 MED ORDER — ONDANSETRON HCL 4 MG/2ML IJ SOLN
4.0000 mg | Freq: Four times a day (QID) | INTRAMUSCULAR | Status: DC | PRN
  Administered 2021-12-18: 4 mg via INTRAVENOUS
  Filled 2021-12-17: qty 2

## 2021-12-17 MED ORDER — ZOLPIDEM TARTRATE 10 MG PO TABS
10.0000 mg | ORAL_TABLET | Freq: Every evening | ORAL | Status: DC | PRN
  Administered 2021-12-17 – 2021-12-20 (×4): 10 mg via ORAL
  Filled 2021-12-17 (×4): qty 1

## 2021-12-17 MED ORDER — POVIDONE-IODINE 10 % EX SWAB
2.0000 | Freq: Once | CUTANEOUS | Status: AC
  Administered 2021-12-17: 2 via TOPICAL

## 2021-12-17 MED ORDER — FENTANYL CITRATE (PF) 100 MCG/2ML IJ SOLN
INTRAMUSCULAR | Status: AC
  Filled 2021-12-17: qty 2

## 2021-12-17 MED ORDER — HYDROMORPHONE HCL 1 MG/ML IJ SOLN
0.5000 mg | INTRAMUSCULAR | Status: DC | PRN
  Administered 2021-12-17 – 2021-12-19 (×2): 1 mg via INTRAVENOUS
  Filled 2021-12-17 (×2): qty 1

## 2021-12-17 MED ORDER — DEXAMETHASONE SODIUM PHOSPHATE 10 MG/ML IJ SOLN
INTRAMUSCULAR | Status: AC
  Filled 2021-12-17: qty 1

## 2021-12-17 MED ORDER — PROPOFOL 500 MG/50ML IV EMUL
INTRAVENOUS | Status: DC | PRN
  Administered 2021-12-17: 100 ug/kg/min via INTRAVENOUS

## 2021-12-17 MED ORDER — ONDANSETRON HCL 4 MG/2ML IJ SOLN
INTRAMUSCULAR | Status: AC
  Filled 2021-12-17: qty 2

## 2021-12-17 MED ORDER — ONDANSETRON HCL 4 MG/2ML IJ SOLN
INTRAMUSCULAR | Status: DC | PRN
  Administered 2021-12-17: 4 mg via INTRAVENOUS

## 2021-12-17 MED ORDER — ACETAMINOPHEN 500 MG PO TABS
1000.0000 mg | ORAL_TABLET | Freq: Four times a day (QID) | ORAL | Status: AC
  Administered 2021-12-17 – 2021-12-18 (×4): 1000 mg via ORAL
  Filled 2021-12-17 (×4): qty 2

## 2021-12-17 MED ORDER — PANTOPRAZOLE SODIUM 40 MG PO TBEC: 40.0000 mg | DELAYED_RELEASE_TABLET | Freq: Every day | ORAL | Status: DC

## 2021-12-17 MED ORDER — SODIUM CHLORIDE (PF) 0.9 % IJ SOLN
INTRAMUSCULAR | Status: AC
  Filled 2021-12-17: qty 10

## 2021-12-17 MED ORDER — SODIUM CHLORIDE 0.9 % IV SOLN: INTRAVENOUS | Status: DC

## 2021-12-17 MED ORDER — OXYCODONE HCL 5 MG PO TABS: 5.0000 mg | ORAL_TABLET | Freq: Once | ORAL | Status: DC | PRN

## 2021-12-17 MED ORDER — BUPIVACAINE-EPINEPHRINE (PF) 0.5% -1:200000 IJ SOLN
INTRAMUSCULAR | Status: DC | PRN
  Administered 2021-12-17: 20 mL via PERINEURAL

## 2021-12-17 MED ORDER — BUPIVACAINE LIPOSOME 1.3 % IJ SUSP
INTRAMUSCULAR | Status: AC
  Filled 2021-12-17: qty 20

## 2021-12-17 MED ORDER — NITROGLYCERIN 0.4 MG SL SUBL: 0.4000 mg | SUBLINGUAL_TABLET | SUBLINGUAL | Status: DC | PRN

## 2021-12-17 MED ORDER — OXYCODONE HCL 5 MG PO TABS
ORAL_TABLET | ORAL | Status: AC
  Filled 2021-12-17: qty 2

## 2021-12-17 MED ORDER — MIDAZOLAM HCL 2 MG/2ML IJ SOLN
INTRAMUSCULAR | Status: AC
  Filled 2021-12-17: qty 2

## 2021-12-17 MED ORDER — ACETAMINOPHEN 10 MG/ML IV SOLN: 1000.0000 mg | Freq: Once | INTRAVENOUS | Status: DC | PRN

## 2021-12-17 MED ORDER — SENNOSIDES-DOCUSATE SODIUM 8.6-50 MG PO TABS
1.0000 | ORAL_TABLET | Freq: Every evening | ORAL | Status: DC | PRN
  Administered 2021-12-17: 1 via ORAL
  Filled 2021-12-17: qty 1

## 2021-12-17 MED ORDER — 0.9 % SODIUM CHLORIDE (POUR BTL) OPTIME
TOPICAL | Status: DC | PRN
  Administered 2021-12-17: 1000 mL

## 2021-12-17 MED ORDER — DIPHENHYDRAMINE HCL 12.5 MG/5ML PO ELIX
12.5000 mg | ORAL_SOLUTION | ORAL | Status: DC | PRN
  Administered 2021-12-20: 25 mg via ORAL
  Filled 2021-12-17: qty 10

## 2021-12-17 MED ORDER — OXYCODONE HCL 5 MG PO TABS
5.0000 mg | ORAL_TABLET | ORAL | Status: DC | PRN
  Administered 2021-12-17 – 2021-12-19 (×3): 10 mg via ORAL
  Filled 2021-12-17 (×3): qty 2

## 2021-12-17 MED ORDER — SODIUM CHLORIDE 0.9 % IV SOLN
INTRAVENOUS | Status: DC | PRN
  Administered 2021-12-17: 80 mL

## 2021-12-17 MED ORDER — FENTANYL CITRATE PF 50 MCG/ML IJ SOSY: 25.0000 ug | PREFILLED_SYRINGE | INTRAMUSCULAR | Status: DC | PRN

## 2021-12-17 MED ORDER — IRBESARTAN 75 MG PO TABS
75.0000 mg | ORAL_TABLET | Freq: Every day | ORAL | Status: DC
  Administered 2021-12-18 – 2021-12-22 (×5): 75 mg via ORAL
  Filled 2021-12-17 (×5): qty 1

## 2021-12-17 MED ORDER — TRANEXAMIC ACID-NACL 1000-0.7 MG/100ML-% IV SOLN
1000.0000 mg | INTRAVENOUS | Status: AC
  Administered 2021-12-17: 1000 mg via INTRAVENOUS
  Filled 2021-12-17: qty 100

## 2021-12-17 MED ORDER — BUPIVACAINE IN DEXTROSE 0.75-8.25 % IT SOLN
INTRATHECAL | Status: DC | PRN
  Administered 2021-12-17: 2 mL via INTRATHECAL

## 2021-12-17 MED ORDER — PROPOFOL 1000 MG/100ML IV EMUL
INTRAVENOUS | Status: AC
  Filled 2021-12-17: qty 100

## 2021-12-17 MED ORDER — DEXAMETHASONE SODIUM PHOSPHATE 10 MG/ML IJ SOLN
8.0000 mg | Freq: Once | INTRAMUSCULAR | Status: AC
  Administered 2021-12-17: 10 mg via INTRAVENOUS

## 2021-12-17 MED ORDER — FENTANYL CITRATE (PF) 100 MCG/2ML IJ SOLN
INTRAMUSCULAR | Status: DC | PRN
  Administered 2021-12-17 (×2): 50 ug via INTRAVENOUS

## 2021-12-17 MED ORDER — ASPIRIN 81 MG PO CHEW: 81.0000 mg | CHEWABLE_TABLET | Freq: Two times a day (BID) | ORAL | Status: DC

## 2021-12-17 MED ORDER — CARVEDILOL 3.125 MG PO TABS
3.1250 mg | ORAL_TABLET | Freq: Two times a day (BID) | ORAL | Status: DC
  Administered 2021-12-18 – 2021-12-22 (×9): 3.125 mg via ORAL
  Filled 2021-12-17 (×10): qty 1

## 2021-12-17 MED ORDER — METHOCARBAMOL 500 MG PO TABS
500.0000 mg | ORAL_TABLET | Freq: Four times a day (QID) | ORAL | Status: DC | PRN
  Administered 2021-12-17 – 2021-12-22 (×14): 500 mg via ORAL
  Filled 2021-12-17 (×15): qty 1

## 2021-12-17 MED ORDER — MAGNESIUM CITRATE PO SOLN: 1.0000 | Freq: Once | ORAL | Status: DC | PRN

## 2021-12-17 MED ORDER — CHLORHEXIDINE GLUCONATE 0.12 % MT SOLN
15.0000 mL | Freq: Once | OROMUCOSAL | Status: AC
  Administered 2021-12-17: 15 mL via OROMUCOSAL

## 2021-12-17 SURGICAL SUPPLY — 55 items
ADH SKN CLS APL DERMABOND .7 (GAUZE/BANDAGES/DRESSINGS) ×1
ATTUNE MED DOME PAT 38 KNEE (Knees) IMPLANT
ATTUNE PS FEM RT SZ 8 CEM KNEE (Femur) IMPLANT
ATTUNE PSRP INSR SZ8 8 KNEE (Insert) IMPLANT
BAG COUNTER SPONGE SURGICOUNT (BAG) ×2 IMPLANT
BAG SPEC THK2 15X12 ZIP CLS (MISCELLANEOUS) ×1
BAG SPNG CNTER NS LX DISP (BAG) ×1
BAG ZIPLOCK 12X15 (MISCELLANEOUS) ×2 IMPLANT
BASE TIBIAL ROT PLAT SZ 8 KNEE (Knees) IMPLANT
BLADE SAG 18X100X1.27 (BLADE) ×2 IMPLANT
BLADE SAW SGTL 11.0X1.19X90.0M (BLADE) ×2 IMPLANT
BNDG ELASTIC 4X5.8 VLCR STR LF (GAUZE/BANDAGES/DRESSINGS) ×2 IMPLANT
BNDG ELASTIC 6X5.8 VLCR STR LF (GAUZE/BANDAGES/DRESSINGS) ×2 IMPLANT
BOWL SMART MIX CTS (DISPOSABLE) ×2 IMPLANT
BSPLAT TIB 8 CMNT ROT PLAT STR (Knees) ×1 IMPLANT
CEMENT HV SMART SET (Cement) IMPLANT
COVER SURGICAL LIGHT HANDLE (MISCELLANEOUS) ×2 IMPLANT
CUFF TOURN SGL QUICK 34 (TOURNIQUET CUFF) ×1
CUFF TRNQT CYL 34X4.125X (TOURNIQUET CUFF) ×2 IMPLANT
DERMABOND ADVANCED .7 DNX12 (GAUZE/BANDAGES/DRESSINGS) ×2 IMPLANT
DRAPE INCISE IOBAN 66X45 STRL (DRAPES) ×2 IMPLANT
DRAPE U-SHAPE 47X51 STRL (DRAPES) ×2 IMPLANT
DRSG AQUACEL AG ADV 3.5X10 (GAUZE/BANDAGES/DRESSINGS) ×2 IMPLANT
DURAPREP 26ML APPLICATOR (WOUND CARE) ×4 IMPLANT
ELECT REM PT RETURN 15FT ADLT (MISCELLANEOUS) ×2 IMPLANT
GLOVE BIOGEL PI IND STRL 7.5 (GLOVE) ×2 IMPLANT
GLOVE BIOGEL PI IND STRL 8 (GLOVE) ×2 IMPLANT
GLOVE ECLIPSE 8.0 STRL XLNG CF (GLOVE) ×2 IMPLANT
GLOVE SURG ORTHO 9.0 STRL STRW (GLOVE) ×2 IMPLANT
GLOVE SURG SS PI 7.0 STRL IVOR (GLOVE) ×2 IMPLANT
GOWN SPEC L4 XLG W/TWL (GOWN DISPOSABLE) ×4 IMPLANT
HANDPIECE INTERPULSE COAX TIP (DISPOSABLE) ×1
KIT TURNOVER KIT A (KITS) IMPLANT
NS IRRIG 1000ML POUR BTL (IV SOLUTION) ×2 IMPLANT
PACK TOTAL KNEE CUSTOM (KITS) ×2 IMPLANT
PIN STEINMAN FIXATION KNEE (PIN) IMPLANT
PROTECTOR NERVE ULNAR (MISCELLANEOUS) ×2 IMPLANT
SET HNDPC FAN SPRY TIP SCT (DISPOSABLE) ×2 IMPLANT
SET PAD KNEE POSITIONER (MISCELLANEOUS) ×2 IMPLANT
SOLUTION PRONTOSAN WOUND 350ML (IRRIGATION / IRRIGATOR) ×2 IMPLANT
SPIKE FLUID TRANSFER (MISCELLANEOUS) ×2 IMPLANT
SPONGE SURGIFOAM ABS GEL 100 (HEMOSTASIS) ×2 IMPLANT
STOCKINETTE 6  STRL (DRAPES) ×1
STOCKINETTE 6 STRL (DRAPES) ×2 IMPLANT
SUT MNCRL AB 3-0 PS2 18 (SUTURE) ×2 IMPLANT
SUT VIC AB 1 CT1 27 (SUTURE) ×3
SUT VIC AB 1 CT1 27XBRD ANTBC (SUTURE) ×6 IMPLANT
SUT VIC AB 2-0 CT1 27 (SUTURE) ×2
SUT VIC AB 2-0 CT1 TAPERPNT 27 (SUTURE) ×4 IMPLANT
SUT VLOC 180 0 24IN GS25 (SUTURE) ×2 IMPLANT
SYR 50ML LL SCALE MARK (SYRINGE) ×2 IMPLANT
TAPE STRIPS DRAPE STRL (GAUZE/BANDAGES/DRESSINGS) ×2 IMPLANT
TIBIAL BASE ROT PLAT SZ 8 KNEE (Knees) ×1 IMPLANT
TRAY CATH INTERMITTENT SS 16FR (CATHETERS) ×2 IMPLANT
WATER STERILE IRR 1000ML POUR (IV SOLUTION) ×4 IMPLANT

## 2021-12-17 NOTE — Evaluation (Signed)
Physical Therapy Evaluation Patient Details Name: [name] MRN: [id number] DOB: 09-30-1955 Today's Date: 12/17/2021  History of Present Illness  Patient is 66 y.o. male s/p Rt TKA on 12/17/21 with PMH significant for anxiety, OA, CHF, CAD, GERD, HTN, LBBB, hx of MI a/p CABG and with AICD, hx of ACDF, back surgery.   Clinical Impression  [name] is a 66 y.o. male POD 0 s/p Rt TKA. Patient reports independence with mobility at baseline. Patient is now limited by functional impairments (see PT problem list below) and requires min guard/assist for transfers and gait with RW. Patient was able to ambulate ~16 feet with RW and min assist. Following ambulation pt c/o dizziness and LE's elevated and head semi-reclined. BP in 70's/50's and RN notified. BP did not recover with chair fully recliner and Total Assist +4 lateral slide completed to transfer chair>bed and placed in trendelenburg. Pt's BP continued to drop 60's/40's. RN staff at bedside providing care. Patient will benefit from continued skilled PT interventions to address impairments and progress towards PLOF. Acute PT will follow to progress mobility and stair training in preparation for safe discharge home. Patient has significant cardiac history and has a full flight of stair to get to bedroom. Pt may need a hospital bed for main level once he progresses to safe level to return home to reduce risk of falls. Will updated follow up recs as pt progresses.     Recommendations for follow up therapy are one component of a multi-disciplinary discharge planning process, led by the attending physician.  Recommendations may be updated based on patient status, additional functional criteria and insurance authorization.  Follow Up Recommendations Follow physician's recommendations for discharge plan and follow up therapies      Assistance Recommended at Discharge Frequent or constant Supervision/Assistance  Patient can return home with the  following  A little help with walking and/or transfers;A little help with bathing/dressing/bathroom;Assistance with cooking/housework;Direct supervision/assist for medications management;Assist for transportation;Help with stairs or ramp for entrance    Equipment Recommendations Rolling walker (2 wheels)  Recommendations for Other Services       Functional Status Assessment Patient has had a recent decline in their functional status and demonstrates the ability to make significant improvements in function in a reasonable and predictable amount of time.     Precautions / Restrictions Precautions Precautions: Fall Restrictions Weight Bearing Restrictions: No Other Position/Activity Restrictions: WBAT      Mobility  Bed Mobility Overal bed mobility: Needs Assistance Bed Mobility: Supine to Sit     Supine to sit: Min guard, HOB elevated     General bed mobility comments: Min guard for safety with supine>sit, cues to use bed rail, pt able to bring LE's off EOB without assist. EOS pt orthostatic with min symptoms. BP conitnued to drop in fully reclined position and Total Assist +4 lateral slide with blanket under pt to move chair to bed. Bed placed in trendelenburg.    Transfers Overall transfer level: Needs assistance Equipment used: Rolling walker (2 wheels) Transfers: Sit to/from Stand Sit to Stand: Min assist, From elevated surface           General transfer comment: cues for safe hand placement for power up, light assist to steady with stand. pt denied dizziness in standing.    Ambulation/Gait Ambulation/Gait assistance: Min assist Gait Distance (Feet): 16 Feet Assistive device: Rolling walker (2 wheels) Gait Pattern/deviations: Step-through pattern, Decreased stride length Gait velocity: decr     General Gait Details:  cues for step pattern and proximity to RW, pt tending to step too far into RW. distance limited for pain control. After sitting pt c/o dizziness  following gait.  Stairs            Wheelchair Mobility    Modified Rankin (Stroke Patients Only)       Balance Overall balance assessment: Needs assistance Sitting-balance support: Feet supported Sitting balance-Leahy Scale: Fair     Standing balance support: Reliant on assistive device for balance, During functional activity, Bilateral upper extremity supported Standing balance-Leahy Scale: Poor                               Pertinent Vitals/Pain Pain Assessment Pain Assessment: 0-10 Pain Score: 4  Pain Location: Rt knee Pain Descriptors / Indicators: Aching, Discomfort Pain Intervention(s): Limited activity within patient's tolerance, Monitored during session, Repositioned, Premedicated before session    Home Living Family/patient expects to be discharged to:: Private residence Living Arrangements: Spouse/significant other Available Help at Discharge: Family Type of Home: House Home Access: Stairs to enter Entrance Stairs-Rails: None Entrance Stairs-Number of Steps: 2 Alternate Level Stairs-Number of Steps: 12 Home Layout: Two level;Bed/bath upstairs;1/2 bath on main level Home Equipment: Educational psychologist (4 wheels);Cane - single point;Hand held shower head      Prior Function Prior Level of Function : Independent/Modified Independent             Mobility Comments: pt mobilizing with no device PTA.       Hand Dominance   Dominant Hand: Right    Extremity/Trunk Assessment   Upper Extremity Assessment Upper Extremity Assessment: Overall WFL for tasks assessed    Lower Extremity Assessment Lower Extremity Assessment: RLE deficits/detail RLE Deficits / Details: good quad activation, no buckling in standing, no extensor lag with SLR. RLE Sensation: WNL RLE Coordination: WNL    Cervical / Trunk Assessment Cervical / Trunk Assessment: Normal  Communication   Communication: No difficulties  Cognition Arousal/Alertness:  Awake/alert Behavior During Therapy: WFL for tasks assessed/performed Overall Cognitive Status: Within Functional Limits for tasks assessed                                          General Comments General comments (skin integrity, edema, etc.): BP assessed in chair, pt semi-reclined. BP 70's/50's. pt fully relined and RN notified. BP reassessed and 70's/50's. Total assist transfer to bed and pt placed in trendelenburg. BP 60's/40's. RR called. pt alert throughout all mobility and conversing with PT and RN staff.    Exercises     Assessment/Plan    PT Assessment Patient needs continued PT services  PT Problem List Decreased strength;Decreased range of motion;Decreased activity tolerance;Decreased balance;Decreased mobility;Decreased knowledge of use of DME;Decreased safety awareness;Decreased knowledge of precautions;Cardiopulmonary status limiting activity;Pain       PT Treatment Interventions DME instruction;Gait training;Stair training;Functional mobility training;Therapeutic activities;Therapeutic exercise;Balance training;Patient/family education    PT Goals (Current goals can be found in the Care Plan section)  Acute Rehab PT Goals Patient Stated Goal: regain independence and knee to stop hurting. PT Goal Formulation: With patient Time For Goal Achievement: 12/24/21 Potential to Achieve Goals: Good    Frequency 7X/week     Co-evaluation               AM-PAC PT "6 Clicks" Mobility  Outcome Measure Help  needed turning from your back to your side while in a flat bed without using bedrails?: A Little Help needed moving from lying on your back to sitting on the side of a flat bed without using bedrails?: A Little Help needed moving to and from a bed to a chair (including a wheelchair)?: A Little Help needed standing up from a chair using your arms (e.g., wheelchair or bedside chair)?: A Little Help needed to walk in hospital room?: A Little Help needed  climbing 3-5 steps with a railing? : Total 6 Click Score: 16    End of Session Equipment Utilized During Treatment: Gait belt Activity Tolerance: Patient tolerated treatment well Patient left: in bed;with call bell/phone within reach;with nursing/sitter in room;with family/visitor present Nurse Communication: Mobility status;Other (comment) (severe hypotension - RN in room and RR called.) PT Visit Diagnosis: Muscle weakness (generalized) (M62.81);Unsteadiness on feet (R26.81);Other abnormalities of gait and mobility (R26.89);Difficulty in walking, not elsewhere classified (R26.2);Dizziness and giddiness (R42)    Time: [id number] PT Time Calculation (min) (ACUTE ONLY): 40 min   Charges:   PT Evaluation $PT Eval Low Complexity: 1 Low PT Treatments $Gait Training: 8-22 mins        Verner Mould, DPT Acute Rehabilitation Services Office [phone]  12/17/21 4:12 PM

## 2021-12-17 NOTE — Transfer of Care (Signed)
Immediate Anesthesia Transfer of Care Note  Patient: [name]  Procedure(s) Performed: TOTAL KNEE ARTHROPLASTY (Right: Knee)  Patient Location: PACU  Anesthesia Type:Spinal  Level of Consciousness: awake, alert , oriented, and patient cooperative  Airway & Oxygen Therapy: Patient Spontanous Breathing and Patient connected to face mask oxygen  Post-op Assessment: Report given to RN and Post -op Vital signs reviewed and stable  Post vital signs: Reviewed and stable  Last Vitals:  Vitals Value Taken Time  BP 120/44 12/17/21 1115  Temp    Pulse 82 12/17/21 1117  Resp 13 12/17/21 1117  SpO2 97 % 12/17/21 1117  Vitals shown include unvalidated device data.  Last Pain:  Vitals:   12/17/21 0643  TempSrc:   PainSc: 8          Complications: No notable events documented.

## 2021-12-17 NOTE — Op Note (Signed)
DATE OF SURGERY:  12/17/2021  TIME: 10:43 AM  PATIENT NAME:  [name]    AGE: 66 y.o.   PRE-OPERATIVE DIAGNOSIS:  Right knee osteoarthritis  POST-OPERATIVE DIAGNOSIS:  Right knee osteoarthritis  PROCEDURE:  Procedure(s): TOTAL KNEE ARTHROPLASTY  SURGEON:  Amer Alcindor ANDREW  ASSISTANT:  Leeanne Haus, PA-C, present and scrubbed throughout the case, critical for assistance with exposure, retraction, instrumentation, and closure.  OPERATIVE IMPLANTS: Depuy PFC Attune Rotating Platform.  Femur size 8, Tibia size 8, Patella size 38 3-peg oval button, with a 8 mm polyethylene insert.   PREOPERATIVE INDICATIONS:   [name] is a 66 y.o. year old male with end stage bone on bone arthritis of the knee who failed conservative treatment and elected for Total Knee Arthroplasty.   The risks, benefits, and alternatives were discussed at length including but not limited to the risks of infection, bleeding, nerve injury, stiffness, blood clots, the need for revision surgery, cardiopulmonary complications, among others, and they were willing to proceed.  OPERATIVE DESCRIPTION:  The patient was brought to the operative room and placed in a supine position.  Spinal anesthesia was administered.  IV antibiotics were given.  The lower extremity was prepped and draped in the usual sterile fashion.  Time out was performed.  The leg was elevated and exsanguinated and the tourniquet was inflated.  Anterior quadriceps tendon splitting approach was performed.  The patella was retracted and osteophytes were removed.  The anterior horn of the medial and lateral meniscus was removed and cruciate ligaments resected.   The distal femur was opened with the drill and the intramedullary distal femoral cutting jig was utilized, set at 5 degrees resecting 10 mm off the distal femur.  Care was taken to protect the collateral ligaments.  The distal femoral sizing jig was applied, taking care to avoid  notching.  Then the 4-in-1 cutting jig was applied and the anterior and posterior femur was cut, along with the chamfer cuts.    Then the extramedullary tibial cutting jig was utilized making the appropriate cut using the anterior tibial crest as a reference building in appropriate posterior slope.  Care was taken during the cut to protect the medial and collateral ligaments.  The proximal tibia was removed along with the posterior horns of the menisci.   The posterior medial femoral osteophytes and posterior lateral femoral osteophytes were removed.    The flexion gap was then measured and was symmetric with the extension gap, measured at 8.  I completed the distal femoral preparation using the appropriate jig to prepare the box.  The patella was then measured, and cut with the saw.    The proximal tibia sized and prepared accordingly with the reamer and the punch, and then all components were trialed with the trial insert.  The knee was found to have excellent balance and full motion.    The above named components were then cemented into place and all excess cement was removed.  The trial polyethylene component was in place during cementation, and then was exchanged for the real polyethylene component.    The knee was easily taken through a range of motion and the patella tracked well and the knee irrigated copiously and the parapatellar and subcutaneous tissue closed with vicryl, and monocryl with steri strips for the skin.  The arthrotomy was closed at 90 of flexion. The wounds were dressed with sterile gauze and the tourniquet released and the patient was awakened and returned to the PACU in  stable and satisfactory condition.  There were no complications.  Total tourniquet time was 78 minutes.

## 2021-12-17 NOTE — Anesthesia Procedure Notes (Signed)
Anesthesia Regional Block: Adductor canal block   Pre-Anesthetic Checklist: , timeout performed,  Correct Patient, Correct Site, Correct Laterality,  Correct Procedure, Correct Position, site marked,  Risks and benefits discussed,  Surgical consent,  Pre-op evaluation,  At surgeon's request and post-op pain management  Laterality: Right and Upper  Prep: chloraprep       Needles:  Injection technique: Single-shot  Needle Type: Echogenic Stimulator Needle     Needle Length: 9cm  Needle Gauge: 22     Additional Needles: Arrow StimuQuik ECHO Echogenic Stimulating PNB Needle  Procedures:,,,, ultrasound used (permanent image in chart),,    Narrative:  Start time: 12/17/2021 8:24 AM End time: 12/17/2021 8:28 AM Injection made incrementally with aspirations every 5 mL.  Performed by: Personally  Anesthesiologist: Val Eagle, MD

## 2021-12-17 NOTE — Anesthesia Procedure Notes (Addendum)
Spinal  Patient location during procedure: OR Start time: 12/17/2021 9:05 AM End time: 12/17/2021 9:10 AM Reason for block: surgical anesthesia Staffing Anesthesiologist: Val Eagle, MD Performed by: Val Eagle, MD Authorized by: Val Eagle, MD   Preanesthetic Checklist Completed: patient identified, IV checked, risks and benefits discussed, surgical consent, monitors and equipment checked, pre-op evaluation and timeout performed Spinal Block Patient position: sitting Prep: DuraPrep Patient monitoring: heart rate, cardiac monitor, continuous pulse ox and blood pressure Approach: midline Location: L3-4 Injection technique: single-shot Needle Needle type: Pencan  Needle gauge: 24 G Needle length: 9 cm Assessment Sensory level: T6 Events: CSF return

## 2021-12-17 NOTE — Interval H&P Note (Signed)
History and Physical Interval Note:  12/17/2021 8:53 AM  [name]  has presented today for surgery, with the diagnosis of Right knee osteoarthritis.  The various methods of treatment have been discussed with the patient and family. After consideration of risks, benefits and other options for treatment, the patient has consented to  Procedure(s) with comments: TOTAL KNEE ARTHROPLASTY (Right) - adductor canal  120 as a surgical intervention.  The patient's history has been reviewed, patient examined, no change in status, stable for surgery.  I have reviewed the patient's chart and labs.  Questions were answered to the patient's satisfaction.     [name]

## 2021-12-17 NOTE — Significant Event (Signed)
Rapid Response Event Note   Reason for Call :  Hypotension after working w/ PT  Initial Focused Assessment:  Patient in trendelenburg position with bolus initiated upon arrival. Patient alert, reported some dizziness and fuzzy vision. History of CHF, last echo on 06/2021 showed EF of 30-35%. EBL documented to  be ; pre-op HGB 15.3. BP improved to 116/67 at bolus completion   Interventions:  bolus CBC  Plan of Care:  Await results of CBC Bedside RN advised to monitor BP regularly and monitor for s/s of fluid overload  Event Summary:   MD Notified: Zadie Cleverly, PA Call Time: 1550 Arrival Time: 1552 End Time: 53  Donnita Falls, RN

## 2021-12-17 NOTE — Care Plan (Signed)
Ortho Bundle Case Management Note  Patient Details  Name: [name] MRN: [id number] Date of Birth: 25-Apr-1955                  R TKA on 12-17-21  DCP: Home with wife  DME: RW ordered through [facility]  PT: EO on 12-22-21   DME Arranged:  Dan Humphreys rolling DME Agency:  [employer]    Additional Comments: Please contact me with any questions of if this plan should need to change.  Ennis Forts, RN,CCM [facility]  [phone] 12/17/2021, 8:52 AM

## 2021-12-18 LAB — CBC
HCT: 40 % (ref 39.0–52.0)
Hemoglobin: 13.1 g/dL (ref 13.0–17.0)
MCH: 32.7 pg (ref 26.0–34.0)
MCHC: 32.8 g/dL (ref 30.0–36.0)
MCV: 99.8 fL (ref 80.0–100.0)
Platelets: 222 10*3/uL (ref 150–400)
RBC: 4.01 MIL/uL — ABNORMAL LOW (ref 4.22–5.81)
RDW: 13.1 % (ref 11.5–15.5)
WBC: 12.9 10*3/uL — ABNORMAL HIGH (ref 4.0–10.5)
nRBC: 0 % (ref 0.0–0.2)

## 2021-12-18 LAB — BASIC METABOLIC PANEL
Anion gap: 9 (ref 5–15)
BUN: 24 mg/dL — ABNORMAL HIGH (ref 8–23)
CO2: 28 mmol/L (ref 22–32)
Calcium: 9 mg/dL (ref 8.9–10.3)
Chloride: 102 mmol/L (ref 98–111)
Creatinine, Ser: 1.11 mg/dL (ref 0.61–1.24)
GFR, Estimated: >60 mL/min (ref >60)
Glucose, Bld: 145 mg/dL — ABNORMAL HIGH (ref 70–99)
Potassium: 5.1 mmol/L (ref 3.5–5.1)
Sodium: 139 mmol/L (ref 135–145)

## 2021-12-18 MED ORDER — ASPIRIN 81 MG PO CHEW
81.0000 mg | CHEWABLE_TABLET | Freq: Every day | ORAL | Status: DC
  Administered 2021-12-18 – 2021-12-22 (×5): 81 mg via ORAL
  Filled 2021-12-18 (×5): qty 1

## 2021-12-18 NOTE — Plan of Care (Signed)

## 2021-12-18 NOTE — Progress Notes (Signed)
Subjective: 1 Day Post-Op Procedure(s) (LRB): TOTAL KNEE ARTHROPLASTY (Right) Patient reports pain as mild.  Mostly located on lateral side of knee but controlled Patient had some issues with PT yesterday when he was up walking around. His BP dropped to 60/40 after up with PT and Rapid response team was called. After bolus his BP was back up to WNL It did state on OP notes that EBL was 750 but that was actually urine output. EBL was 50 that was corrected on charting. Patient reports he is feeling much better, but does report that this happens occasionally at home after doing a lot of exertion in the yard.   Objective: Vital signs in last 24 hours: Temp:  [97.5 F (36.4 C)-98.5 F (36.9 C)] 97.8 F (36.6 C) (12/14 0609) Pulse Rate:  [72-87] 87 (12/14 0609) Resp:  [13-20] 20 (12/14 0609) BP: (76-162)/(44-91) 131/73 (12/14 0609) SpO2:  [91 %-99 %] 99 % (12/14 0609) Weight:  [103.9 kg-108.5 kg] 108.5 kg (12/13 1937)  Intake/Output from previous day: 12/13 0701 - 12/14 0700 In: 3275.4 [P.O.:960; I.V.:1582.5; IV Piggyback:732.9] Out: 2300 [Urine:2250; Blood:50] Intake/Output this shift: Total I/O In: 1010 [P.O.:960; IV Piggyback:50] Out: 875 [Urine:875]  Recent Labs    12/17/21 1647 12/18/21 0340  HGB 13.3 13.1   Recent Labs    12/17/21 1647 12/18/21 0340  WBC 8.2 12.9*  RBC 4.05* 4.01*  HCT 40.9 40.0  PLT 177 222   Recent Labs    12/17/21 1647 12/18/21 0340  NA 139 139  K 4.1 5.1  CL 104 102  CO2 27 28  BUN 20 24*  CREATININE 0.95 1.11  GLUCOSE 159* 145*  CALCIUM 8.5* 9.0   No results for input(s): "LABPT", "INR" in the last 72 hours.  Neurologically intact Neurovascular intact Sensation intact distally Intact pulses distally Dorsiflexion/Plantar flexion intact Incision: dressing C/D/I No cellulitis present Compartment soft   Assessment/Plan: 1 Day Post-Op Procedure(s) (LRB): TOTAL KNEE ARTHROPLASTY (Right) Advance diet Up with therapy continue  working. Patient does have a flight of stairs at home so we will have to see how he manages here  Labs reviewed and H and H are WNL Most likely will need to stay an additional night due to drop in BP yesterday with PT DVT ppx: Plavix and ASA 81 mg      Patient's anticipated LOS is less than 2 midnights, meeting these requirements: - Younger than 51 - Lives within 1 hour of care - Has a competent adult at home to recover with post-op recover - NO history of  - Chronic pain requiring opiods  - Diabetes  - Coronary Artery Disease  - Heart failure  - Heart attack  - Stroke  - DVT/VTE  - Cardiac arrhythmia  - Respiratory Failure/COPD  - Renal failure  - Anemia  - Advanced Liver disease     Cherie Dark, PA-C [facility] with Dr. Thomasena Edis [phone] 12/18/2021, 6:35 AM

## 2021-12-18 NOTE — Care Management (Cosign Needed)
Patient suffers from chronic pain which is caused by osteoarthritis of right knee. Hospital bed will alleviate pain by allowing the right leg to be positioned in ways not feasible with a normal bed. Pain episodes require frequent and immediate changes in body position which cannot be achieved with a normal bed.

## 2021-12-18 NOTE — TOC Initial Note (Signed)
Transition of Care [facility]) - Initial/Assessment Note   Patient Details  Name: [name] MRN: [id number] Date of Birth: 03/22/1955  [facility]) CM/SW Contact:    Sherie Don, LCSW Phone Number: 12/18/2021, 11:59 AM  Clinical Narrative: Patient is expected to discharge home after working with PT. CSW met with patient to review discharge plan and needs. Patient will go home with OPPT at [facility]. Patient will need a rolling walker, which was delivered to patient's room by [employer]. PT note recommended possible hospital bed for home use as patient may not be able to ascend the stairs to the second floor at home where the bedrooms are located. TOC to follow regarding possible hospital bed.  Expected Discharge Plan: OP Rehab Barriers to Discharge: No Barriers Identified  Patient Goals and CMS Choice Patient states their goals for this hospitalization and ongoing recovery are:: Discharge home with OPPT at [facility] CMS Medicare.gov Compare Post Acute Care list provided to:: Patient Choice offered to / list presented to : Patient  Expected Discharge Plan and Services Expected Discharge Plan: OP Rehab In-house Referral: Clinical Social Work Post Acute Care Choice: Durable Medical Equipment Living arrangements for the past 2 months: Single Family Home             DME Arranged: Walker rolling DME Agency: [place] Representative spoke with at DME Agency: Prearranged in orthopedist's office  Prior Living Arrangements/Services Living arrangements for the past 2 months: Single Family Home Lives with:: Spouse Patient language and need for interpreter reviewed:: Yes Need for Family Participation in Patient Care: No (Comment) Care giver support system in place?: Yes (comment) Criminal Activity/Legal Involvement Pertinent to Current Situation/Hospitalization: No - Comment as needed  Activities of Daily Living Home Assistive Devices/Equipment: None ADL Screening (condition  at time of admission) Patient's cognitive ability adequate to safely complete daily activities?: Yes Is the patient deaf or have difficulty hearing?: No Does the patient have difficulty seeing, even when wearing glasses/contacts?: No Does the patient have difficulty concentrating, remembering, or making decisions?: No Patient able to express need for assistance with ADLs?: Yes Does the patient have difficulty dressing or bathing?: No Independently performs ADLs?: Yes (appropriate for developmental age) Does the patient have difficulty walking or climbing stairs?: No Weakness of Legs: None Weakness of Arms/Hands: None  Emotional Assessment Appearance:: Appears stated age Attitude/Demeanor/Rapport: Engaged Affect (typically observed): Accepting Orientation: : Oriented to Self, Oriented to Place, Oriented to  Time, Oriented to Situation Alcohol / Substance Use: Not Applicable Psych Involvement: No (comment)  Admission diagnosis:  Osteoarthritis of right knee [M17.11] Patient Active Problem List   Diagnosis Date Noted   Osteoarthritis of right knee 12/17/2021   Acute on chronic systolic CHF (congestive heart failure), NYHA class 3 ([place]) 06/23/2021   Low testosterone 03/10/2021   Lumbar back pain 10/03/2020   Pseudoarthrosis of lumbar spine 10/03/2020   Unstable angina ([place]) 09/06/2020   Encounter for health maintenance examination in adult 09/03/2020   Encounter for screening for malignant neoplasm of colon 09/03/2020   Impaired fasting blood sugar 09/03/2020   Fatigue 09/03/2020   Screening for prostate cancer 09/03/2020   History of gout 09/03/2020   Medicare welcome exam 09/03/2020   Insomnia 01/11/2020   Back pain 01/11/2020   History of urinary tract infection 01/11/2020   Vaccine counseling 01/11/2020   Rumination 01/11/2020   Anxiety 01/11/2020   Chronic diarrhea 12/05/2019   Food intolerance 12/05/2019   Abdominal cramping 12/05/2019   Abdominal bloating  12/05/2019    Need for influenza vaccination 12/05/2019   Right knee pain 11/03/2019   Shoulder pain, acute 06/09/2019   Hip pain 02/24/2019   Knee pain 02/24/2019   Bulbous urethral stricture 10/15/2017   Personal history of urethral stricture 09/40/7680   Chronic systolic heart failure ([place]) 06/22/2017   S/P CABG x 4 06/14/2017   ACS (acute coronary syndrome) ([place]) 06/08/2017   Angina pectoris ([place])    Gout 08/30/2014   Neck pain, chronic 12/17/2010   Back pain, chronic 12/01/2010   Pericarditis 10/02/2010   CAD (coronary artery disease) 05/14/2010   Hypertension    Hypercholesterolemia    LBBB (left bundle branch block)    NSTEMI (non-ST elevated myocardial infarction) ([place])    PCP:  Wardell Honour, MD Pharmacy:   [facility] [place], [place] - [address] [place] [address] Phone: [phone] Fax: [place] [address] [place] [address] Phone: [phone] Fax: [phone]  Social Determinants of Health (SDOH) Interventions    Readmission Risk Interventions     No data to display

## 2021-12-18 NOTE — Anesthesia Postprocedure Evaluation (Signed)
Anesthesia Post Note  Patient: [name]  Procedure(s) Performed: TOTAL KNEE ARTHROPLASTY (Right: Knee)     Patient location during evaluation: PACU Anesthesia Type: Regional, MAC and Spinal Level of consciousness: awake and alert Pain management: pain level controlled Vital Signs Assessment: post-procedure vital signs reviewed and stable Respiratory status: spontaneous breathing, nonlabored ventilation and respiratory function stable Cardiovascular status: stable and blood pressure returned to baseline Postop Assessment: no apparent nausea or vomiting and spinal receding Anesthetic complications: no  No notable events documented.  Last Vitals:  Vitals:   12/18/21 0803 12/18/21 1100  BP: 124/77 (!) 147/78  Pulse: 82 86  Resp: 18   Temp: 36.4 C   SpO2: 94%     Last Pain:  Vitals:   12/18/21 0851  TempSrc:   PainSc: 5                  Fern Asmar

## 2021-12-18 NOTE — Progress Notes (Signed)
Physical Therapy Treatment Patient Details Name: [name] MRN: [id number] DOB: November 03, 1955 Today's Date: 12/18/2021   History of Present Illness Patient is 66 y.o. male s/p Rt TKA on 12/17/21 with PMH significant for anxiety, OA, CHF, CAD, GERD, HTN, LBBB, hx of MI a/p CABG and with AICD, hx of ACDF, back surgery.    PT Comments    Patient progressed this afternoon with improved tolerance to gait distance and no c/o dizziness or SOB. Pt ambulated ~100' with spouse providing safe guarding with cues and supervision from therapist. He initiated stair training with bil UE support on rails, cues for sequencing and assist to steady. EOS reviewed HEP for ROM and circulation. PT was greatly fatigued by stairs and has to ascend a flight to reach bed/bath. Will continue to progress as able.   Orthostatic VS for the past 24 hrs:  BP- Sitting Pulse- Sitting BP- Standing at 0 minutes Pulse- Standing at 0 minutes BP- Standing after gait Pulse- Standing after gait  12/18/21 1413 121/69 64 117/68 85 116/77 88      Recommendations for follow up therapy are one component of a multi-disciplinary discharge planning process, led by the attending physician.  Recommendations may be updated based on patient status, additional functional criteria and insurance authorization.  Follow Up Recommendations  Follow physician's recommendations for discharge plan and follow up therapies     Assistance Recommended at Discharge Frequent or constant Supervision/Assistance  Patient can return home with the following A little help with walking and/or transfers;A little help with bathing/dressing/bathroom;Assistance with cooking/housework;Direct supervision/assist for medications management;Assist for transportation;Help with stairs or ramp for entrance   Equipment Recommendations  Rolling walker (2 wheels)    Recommendations for Other Services       Precautions / Restrictions Precautions Precautions:  Fall Restrictions Weight Bearing Restrictions: No Other Position/Activity Restrictions: WBAT     Mobility  Bed Mobility Overal bed mobility: Needs Assistance Bed Mobility: Supine to Sit     Supine to sit: Min guard, HOB elevated     General bed mobility comments: pt OOB in recliner    Transfers Overall transfer level: Needs assistance Equipment used: Rolling walker (2 wheels) Transfers: Sit to/from Stand Sit to Stand: From elevated surface, Min guard           General transfer comment: good carryover for safe technique with sit<>stand, guarding for rise from recliner.    Ambulation/Gait Ambulation/Gait assistance: Min guard, Min assist Gait Distance (Feet): 100 Feet Assistive device: Rolling walker (2 wheels) Gait Pattern/deviations: Step-through pattern, Decreased stride length Gait velocity: decr     General Gait Details: guarding for safety with intermittent cues for walker position. pt denied dizziness throughout. Pt's spouse provided safe guarding with cues and supervision from therapist.   Stairs Stairs: Yes Stairs assistance: Min assist, Min guard Stair Management: Two rails, Step to pattern, Forwards Number of Stairs: 3 General stair comments: cues for sequencing "up with good, down with bad" and for safe gaurding for spouse to provide. no LOB noted and pt denied dizziness throughout.   Wheelchair Mobility    Modified Rankin (Stroke Patients Only)       Balance Overall balance assessment: Needs assistance Sitting-balance support: Feet supported Sitting balance-Leahy Scale: Fair     Standing balance support: Reliant on assistive device for balance, During functional activity, Bilateral upper extremity supported Standing balance-Leahy Scale: Poor  Cognition Arousal/Alertness: Awake/alert Behavior During Therapy: WFL for tasks assessed/performed Overall Cognitive Status: Within Functional Limits for  tasks assessed                                          Exercises Total Joint Exercises Ankle Circles/Pumps: AROM, Both, 15 reps Quad Sets: AROM, Both, 10 reps Short Arc Quad: AROM, 5 reps Heel Slides: AROM, Right, 10 reps Hip ABduction/ADduction: AROM, 10 reps, Right Long Arc Quad: AROM, 10 reps, Right    General Comments        Pertinent Vitals/Pain Pain Assessment Pain Assessment: 0-10 Pain Score: 4  Pain Location: Rt knee Pain Descriptors / Indicators: Aching, Discomfort Pain Intervention(s): Limited activity within patient's tolerance, Monitored during session, Repositioned, Ice applied    Home Living                          Prior Function            PT Goals (current goals can now be found in the care plan section) Acute Rehab PT Goals Patient Stated Goal: regain independence and knee to stop hurting. PT Goal Formulation: With patient Time For Goal Achievement: 12/24/21 Potential to Achieve Goals: Good Progress towards PT goals: Progressing toward goals    Frequency    7X/week      PT Plan Current plan remains appropriate    Co-evaluation              AM-PAC PT "6 Clicks" Mobility   Outcome Measure  Help needed turning from your back to your side while in a flat bed without using bedrails?: A Little Help needed moving from lying on your back to sitting on the side of a flat bed without using bedrails?: A Little Help needed moving to and from a bed to a chair (including a wheelchair)?: A Little Help needed standing up from a chair using your arms (e.g., wheelchair or bedside chair)?: A Little Help needed to walk in hospital room?: A Little Help needed climbing 3-5 steps with a railing? : Total 6 Click Score: 16    End of Session Equipment Utilized During Treatment: Gait belt Activity Tolerance: Patient tolerated treatment well Patient left: in bed;with call bell/phone within reach;with nursing/sitter in room;with  family/visitor present Nurse Communication: Mobility status;Other (comment) (severe hypotension - RN in room and RR called.) PT Visit Diagnosis: Muscle weakness (generalized) (M62.81);Unsteadiness on feet (R26.81);Other abnormalities of gait and mobility (R26.89);Difficulty in walking, not elsewhere classified (R26.2);Dizziness and giddiness (R42)     Time: [id number] PT Time Calculation (min) (ACUTE ONLY): 29 min  Charges:  $Gait Training: 8-22 mins $Therapeutic Exercise: 8-22 mins                     Verner Mould, DPT Acute Rehabilitation Services Office [phone]  12/18/21 4:29 PM

## 2021-12-18 NOTE — Progress Notes (Signed)
Physical Therapy Treatment Patient Details Name: [name] MRN: [id number] DOB: Dec 02, 1955 Today's Date: 12/18/2021   History of Present Illness Patient is 66 y.o. male s/p Rt TKA on 12/17/21 with PMH significant for anxiety, OA, CHF, CAD, GERD, HTN, LBBB, hx of MI a/p CABG and with AICD, hx of ACDF, back surgery.    PT Comments    Patient seen for AM session with therapy. Pt resting in bed reports feeling well despite some knee pain. Pt demonstrated good carryover for safe hand placement with sit<>stand transfers, EOB elevated to improve power up for pt's height. BP assessed throughout and stable supine>sit. Noted to have significant drop with sit>stand (see below) however pt denied symptoms and BP remained stable throughout gait. Pt ambulated ~60' with RW and close guard. EOS pt resting in recliner and BP elevated back to 147/78 mmHg. Reviewed HEP for ROM and circulation and educated on elevating Rt LE for knee extension and edema management. Will continue to progress as able in acute setting.  Orthostatic VS for the past 24 hrs:  BP- Lying Pulse- Lying BP- Sitting Pulse- Sitting BP- Standing at 0 minutes Pulse- Standing at 0 minutes BP- Standing at 3 minutes Pulse- Standing at 3 minutes BP- Standing at 5 minutes Pulse- Standing at 5 minutes  12/18/21 1037 140/73 77 137/78 81 114/67 83 114/72 78 115/62 60   Seated rest after gait and HEP.   12/18/21 1100  Vital Signs  Pulse Rate 86  Pulse Rate Source Monitor  BP (!) 147/78  BP Location Left Arm  BP Method Automatic  Patient Position (if appropriate) Sitting      Recommendations for follow up therapy are one component of a multi-disciplinary discharge planning process, led by the attending physician.  Recommendations may be updated based on patient status, additional functional criteria and insurance authorization.  Follow Up Recommendations  Follow physician's recommendations for discharge plan and follow up therapies      Assistance Recommended at Discharge Frequent or constant Supervision/Assistance  Patient can return home with the following A little help with walking and/or transfers;A little help with bathing/dressing/bathroom;Assistance with cooking/housework;Direct supervision/assist for medications management;Assist for transportation;Help with stairs or ramp for entrance   Equipment Recommendations  Rolling walker (2 wheels)    Recommendations for Other Services       Precautions / Restrictions Precautions Precautions: Fall Restrictions Weight Bearing Restrictions: No Other Position/Activity Restrictions: WBAT     Mobility  Bed Mobility Overal bed mobility: Needs Assistance Bed Mobility: Supine to Sit     Supine to sit: Min guard, HOB elevated     General bed mobility comments: guaridng for safety, pt able to bring LE's off EOB without assist.    Transfers Overall transfer level: Needs assistance Equipment used: Rolling walker (2 wheels) Transfers: Sit to/from Stand Sit to Stand: From elevated surface, Min guard           General transfer comment: cues for hand placement, overall good carryover from yesterday. min guard for safety. pt denied dizziness in standing. BP stable.    Ambulation/Gait Ambulation/Gait assistance: Min guard, Min assist Gait Distance (Feet): 60 Feet Assistive device: Rolling walker (2 wheels) Gait Pattern/deviations: Step-through pattern, Decreased stride length Gait velocity: decr     General Gait Details: close min guard with assist for safe hand placement throughout. no LOB noted, occasional assist to progress walker safe distance as pt tends to step too close to RW frame.   Stairs  Wheelchair Mobility    Modified Rankin (Stroke Patients Only)       Balance Overall balance assessment: Needs assistance Sitting-balance support: Feet supported Sitting balance-Leahy Scale: Fair     Standing balance support: Reliant on  assistive device for balance, During functional activity, Bilateral upper extremity supported Standing balance-Leahy Scale: Poor                              Cognition Arousal/Alertness: Awake/alert Behavior During Therapy: WFL for tasks assessed/performed Overall Cognitive Status: Within Functional Limits for tasks assessed                                          Exercises Total Joint Exercises Ankle Circles/Pumps: AROM, Both, 15 reps Quad Sets: AROM, Both, 10 reps Heel Slides: AROM, Right, 10 reps    General Comments General comments (skin integrity, edema, etc.): BP improved but pt did have orthostatic drop with sit>stand. BP remained stable during gait. (see above)      Pertinent Vitals/Pain Pain Assessment Pain Assessment: 0-10 Pain Score: 5  Pain Location: Rt knee Pain Descriptors / Indicators: Aching, Discomfort Pain Intervention(s): Limited activity within patient's tolerance, Monitored during session, Repositioned, Ice applied    Home Living                          Prior Function            PT Goals (current goals can now be found in the care plan section) Acute Rehab PT Goals Patient Stated Goal: regain independence and knee to stop hurting. PT Goal Formulation: With patient Time For Goal Achievement: 12/24/21 Potential to Achieve Goals: Good Progress towards PT goals: Progressing toward goals    Frequency    7X/week      PT Plan Current plan remains appropriate    Co-evaluation              AM-PAC PT "6 Clicks" Mobility   Outcome Measure  Help needed turning from your back to your side while in a flat bed without using bedrails?: A Little Help needed moving from lying on your back to sitting on the side of a flat bed without using bedrails?: A Little Help needed moving to and from a bed to a chair (including a wheelchair)?: A Little Help needed standing up from a chair using your arms (e.g.,  wheelchair or bedside chair)?: A Little Help needed to walk in hospital room?: A Little Help needed climbing 3-5 steps with a railing? : Total 6 Click Score: 16    End of Session Equipment Utilized During Treatment: Gait belt Activity Tolerance: Patient tolerated treatment well Patient left: in bed;with call bell/phone within reach;with nursing/sitter in room;with family/visitor present Nurse Communication: Mobility status;Other (comment) (severe hypotension - RN in room and RR called.) PT Visit Diagnosis: Muscle weakness (generalized) (M62.81);Unsteadiness on feet (R26.81);Other abnormalities of gait and mobility (R26.89);Difficulty in walking, not elsewhere classified (R26.2);Dizziness and giddiness (R42)     Time: 4008-6761 PT Time Calculation (min) (ACUTE ONLY): 28 min  Charges:  $Gait Training: 8-22 mins $Therapeutic Exercise: 8-22 mins            Wynn Maudlin, DPT Acute Rehabilitation Services Office [phone]  12/18/21 1:12 PM

## 2021-12-19 ENCOUNTER — Encounter (HOSPITAL_COMMUNITY): Payer: Self-pay | Admitting: Specialist

## 2021-12-19 MED ORDER — BISACODYL 5 MG PO TBEC: 5.0000 mg | DELAYED_RELEASE_TABLET | Freq: Every day | ORAL | 0 refills | Status: DC | PRN

## 2021-12-19 MED ORDER — TRAMADOL HCL 50 MG PO TABS: 50.0000 mg | ORAL_TABLET | Freq: Four times a day (QID) | ORAL | 0 refills | Status: DC

## 2021-12-19 MED ORDER — ONDANSETRON HCL 4 MG PO TABS: 4.0000 mg | ORAL_TABLET | Freq: Four times a day (QID) | ORAL | 0 refills | Status: DC | PRN

## 2021-12-19 MED ORDER — METHOCARBAMOL 500 MG PO TABS: 500.0000 mg | ORAL_TABLET | Freq: Four times a day (QID) | ORAL | 0 refills | Status: DC | PRN

## 2021-12-19 MED ORDER — OXYCODONE HCL 5 MG PO TABS: 5.0000 mg | ORAL_TABLET | ORAL | 0 refills | Status: DC | PRN

## 2021-12-19 NOTE — Progress Notes (Signed)
Physical Therapy Treatment Patient Details Name: [name] MRN: [id number] DOB: February 12, 1955 Today's Date: 12/19/2021   History of Present Illness Patient is 66 y.o. male s/p Rt TKA on 12/17/21 with PMH significant for anxiety, OA, CHF, CAD, GERD, HTN, LBBB, hx of MI a/p CABG and with AICD, hx of ACDF, back surgery.    PT Comments    Patient limited this visit due to dizziness or "woozy/fuzzy" sensation after pain meds this AM. BP noted to drop >20 mmHg for SBP with sit>stand and symptoms worsened. Min assist provided for transfer bed>chair and pt completed HEP for ROM with further mobility deferred for PM session. Pt noted to have increased WOB and more SOB after transfer, SpO2 dropped to 85% low and steady around 88-90% on RA. RN requested 2L/min donned and SpO2 improved to 95%. Will continue to progress as able.    Recommendations for follow up therapy are one component of a multi-disciplinary discharge planning process, led by the attending physician.  Recommendations may be updated based on patient status, additional functional criteria and insurance authorization.  Follow Up Recommendations  Follow physician's recommendations for discharge plan and follow up therapies     Assistance Recommended at Discharge Frequent or constant Supervision/Assistance  Patient can return home with the following A little help with walking and/or transfers;A little help with bathing/dressing/bathroom;Assistance with cooking/housework;Direct supervision/assist for medications management;Assist for transportation;Help with stairs or ramp for entrance   Equipment Recommendations  Rolling walker (2 wheels)    Recommendations for Other Services       Precautions / Restrictions Precautions Precautions: Fall Restrictions Weight Bearing Restrictions: No Other Position/Activity Restrictions: WBAT     Mobility  Bed Mobility Overal bed mobility: Needs Assistance Bed Mobility: Supine to Sit      Supine to sit: Min guard, HOB elevated     General bed mobility comments: pt able to bring LE's off EOB with some extra time and use of bed rail    Transfers Overall transfer level: Needs assistance Equipment used: Rolling walker (2 wheels) Transfers: Sit to/from Stand, Bed to chair/wheelchair/BSC Sit to Stand: From elevated surface, Min guard   Step pivot transfers: Min assist       General transfer comment: pt c/o woozy/fuzzy feeling in head after pain meds this AM. symptoms worse in standing and pt returned to sit EOB after initial sit<>stand. Min assist to rise and take small steps bed>chair. BP stable in recliner.    Ambulation/Gait                   Stairs             Wheelchair Mobility    Modified Rankin (Stroke Patients Only)       Balance Overall balance assessment: Needs assistance Sitting-balance support: Feet supported Sitting balance-Leahy Scale: Fair     Standing balance support: Reliant on assistive device for balance, During functional activity, Bilateral upper extremity supported Standing balance-Leahy Scale: Poor                              Cognition Arousal/Alertness: Awake/alert Behavior During Therapy: WFL for tasks assessed/performed Overall Cognitive Status: Within Functional Limits for tasks assessed                                          Exercises Total Joint  Exercises Ankle Circles/Pumps: AROM, Both, 20 reps Quad Sets: Both, AROM, 10 reps Heel Slides: AROM, Right, 10 reps Hip ABduction/ADduction: AROM, 10 reps, Right    General Comments        Pertinent Vitals/Pain Pain Assessment Pain Assessment: 0-10 Pain Score: 3  Pain Location: Rt knee Pain Descriptors / Indicators: Aching, Discomfort Pain Intervention(s): Repositioned, Limited activity within patient's tolerance, Monitored during session, Premedicated before session, Ice applied    Home Living                           Prior Function            PT Goals (current goals can now be found in the care plan section) Acute Rehab PT Goals Patient Stated Goal: regain independence and knee to stop hurting. PT Goal Formulation: With patient Time For Goal Achievement: 12/24/21 Potential to Achieve Goals: Good Progress towards PT goals: Progressing toward goals    Frequency    7X/week      PT Plan Current plan remains appropriate    Co-evaluation              AM-PAC PT "6 Clicks" Mobility   Outcome Measure  Help needed turning from your back to your side while in a flat bed without using bedrails?: A Little Help needed moving from lying on your back to sitting on the side of a flat bed without using bedrails?: A Little Help needed moving to and from a bed to a chair (including a wheelchair)?: A Little Help needed standing up from a chair using your arms (e.g., wheelchair or bedside chair)?: A Little Help needed to walk in hospital room?: A Little Help needed climbing 3-5 steps with a railing? : Total 6 Click Score: 16    End of Session Equipment Utilized During Treatment: Gait belt Activity Tolerance: Patient tolerated treatment well Patient left: in bed;with call bell/phone within reach;with nursing/sitter in room;with family/visitor present Nurse Communication: Mobility status;Other (comment) (severe hypotension - RN in room and RR called.) PT Visit Diagnosis: Muscle weakness (generalized) (M62.81);Unsteadiness on feet (R26.81);Other abnormalities of gait and mobility (R26.89);Difficulty in walking, not elsewhere classified (R26.2);Dizziness and giddiness (R42)     Time: [id number] PT Time Calculation (min) (ACUTE ONLY): 33 min  Charges:  $Therapeutic Exercise: 8-22 mins $Therapeutic Activity: 8-22 mins                    Verner Mould, DPT Acute Rehabilitation Services Office [phone]  12/19/21 3:20 PM

## 2021-12-19 NOTE — Plan of Care (Signed)
  Problem: Education: Goal: Knowledge of General Education information will improve Description: Including pain rating scale, medication(s)/side effects and non-pharmacologic comfort measures Outcome: Progressing   Problem: Activity: Goal: Risk for activity intolerance will decrease Outcome: Progressing   Problem: Pain Managment: Goal: General experience of comfort will improve Outcome: Progressing   

## 2021-12-19 NOTE — Progress Notes (Signed)
DME Justification Note  Patient Details Name: [name] MRN: [id number] DOB: Sep 04, 1955   Continue to recommend hospital bed to improve safety of pt with return home as he is unsafe to ascend flight of stairs to bedrooms and unable to sleep on requires sofa as he requires [facility] to be elevated due to  cardiopulmonary status. As pt will be confined to room on main level he will also require a BSC to allow for safe toileting. Will continue to progress pt as able for safe return home.   Patient is not able to walk the distance required to go the bathroom on the second floor and will be confined alone to a single room for prolonged time. A 3-in-1 BSC will alleviate this problem.   Patient suffers from CHF and has trouble breathing at night when head is elevated less than 30 degrees. Bed wedges do not provide enough elevation to resolve breathing issues. SOB and increased work of breathing cause patient to require frequent changes in body position which cannont be achieved with normal bed.   Wynn Maudlin, DPT Acute Rehabilitation Services Office [phone]  12/19/21 4:06 PM

## 2021-12-19 NOTE — Discharge Summary (Signed)
Physician Discharge Summary  Patient ID: [name] MRN: [id number] DOB/AGE: 1955/02/15 66 y.o.  Admit date: 12/17/2021 Discharge date: 12/22/2021  Admission Diagnoses: Osteoarthritis of right knee Discharge Diagnoses:  Principal Problem:   Osteoarthritis of right knee   Discharged Condition: good  Hospital Course: Patient was admitted on 12/17/2021 for right knee osteoarthritis and had a right TKA. He tolerated surgery well and was sent to PACU and postop floor in stable condition. He was unable to get up with PT day 0 due to drop in BP. He was stabilized. No other events overnight. Postop day 1 patient was able to get up with PT and ambulate. Some difficulty with pain control. Postop day 2 doing well was able to get up with PT but was limited due to destating and SOB. Was given oxygen. Worked with PT and similar thing happened. Patient was placed on tele bed. Postop day 3 patient doing okay just slow to improve. Still having some SOB and new symptom of confusion today. Pain medications were changed to hydrocodone and confusion improved. Postop day 4 patient able to get up a little more. Postop day 5 patient is feeling best he has since bing here. Worked with PT and was able to be d/c home today.   Will start OPPT this week  Meds sent to pharmacy  Consults: None  Significant Diagnostic Studies: labs: WNL  Treatments: IV hydration, antibiotics: Ancef, analgesia: acetaminophen, Dilaudid, and oxycodone, anticoagulation: ASA and Plavix, therapies: PT, and surgery: right TKA  Discharge Exam: Blood pressure 115/63, pulse 73, temperature 98 F (36.7 C), resp. rate 17, height 6\' 2"  (1.88 m), weight 108.5 kg, SpO2 98 %. General appearance: alert, cooperative, appears stated age, and no distress Extremities: extremities normal, atraumatic, no cyanosis or edema and Homans sign is negative, no sign of DVT Pulses: 2+ and symmetric Skin: Skin color, texture, turgor normal. No rashes or  lesions Neurologic: Grossly normal Incision/Wound:  Disposition: Discharge disposition: 01-Home or Self Care       Discharge Instructions     Call MD / Call 911   Complete by: As directed    If you experience chest pain or shortness of breath, CALL 911 and be transported to the hospital emergency room.  If you develope a fever above 101 F, pus (white drainage) or increased drainage or redness at the wound, or calf pain, call your surgeon's office.   Constipation Prevention   Complete by: As directed    Drink plenty of fluids.  Prune juice may be helpful.  You may use a stool softener, such as Colace (over the counter) 100 mg twice a day.  Use MiraLax (over the counter) for constipation as needed.   Diet - low sodium heart healthy   Complete by: As directed    Discharge instructions   Complete by: As directed    Dr. Sydnee Cabal [facility] [address] [place], [place] [address] [phone]  TOTAL KNEE REPLACEMENT POSTOPERATIVE DIRECTIONS  Knee Rehabilitation, Guidelines Following Surgery  Results after knee surgery are often greatly improved when you follow the exercise, range of motion and muscle strengthening exercises prescribed by your doctor. Safety measures are also important to protect the knee from further injury. Any time any of these exercises cause you to have increased pain or swelling in your knee joint, decrease the amount until you are comfortable again and slowly increase them. If you have problems or questions, call your caregiver or physical therapist for advice.  HOME CARE INSTRUCTIONS  Remove items at home which could result in a fall. This includes throw rugs or furniture in walking pathways.  ICE to the affected knee every three hours for 30 minutes at a time and then as needed for pain and swelling.  Continue to use ice on the knee for pain and swelling from surgery. You may notice swelling that will progress down to the foot and ankle.   This is normal after surgery.  Elevate the leg when you are not up walking on it.   Continue to use the breathing machine which will help keep your temperature down.  It is common for your temperature to cycle up and down following surgery, especially at night when you are not up moving around and exerting yourself.  The breathing machine keeps your lungs expanded and your temperature down. Do not place pillow under knee, focus on keeping the knee straight while resting   DIET You may resume your previous home diet once your are discharged from the hospital.  DRESSING / WOUND CARE / SHOWERING Keep the surgical dressing until follow up.  The dressing is water proof, but you need to put extra covering over it like plastic wrap.  IF THE DRESSING FALLS OFF or the wound gets wet inside, change the dressing with sterile gauze.  Please use good hand washing techniques before changing the dressing.  Do not use any lotions or creams on the incision until instructed by your surgeon.   You may start showering once you are discharged home but do not submerge the incision under water.  You are sent home with an ACE bandage on over the leg, this can be removed at 3 days from surgery. At this time you can start showering. Please place the white TED stocking on the surgical leg after. This needs to be worn on the surgical leg for 2 weeks after surgery.   ACTIVITY Walk with your walker as instructed. Use walker as long as suggested by your caregivers. Avoid periods of inactivity such as sitting longer than an hour when not asleep. This helps prevent blood clots.  You may resume a sexual relationship in one month or when given the OK by your doctor.  You may return to work once you are cleared by your doctor.  Do not drive a car for 6 weeks or until released by you surgeon.  Do not drive while taking narcotics.  WEIGHT BEARING Weight bearing as tolerated with assist device (walker, cane, etc) as directed, use it  as long as suggested by your surgeon or therapist, typically at least 4-6 weeks.  POSTOPERATIVE CONSTIPATION PROTOCOL Constipation - defined medically as fewer than three stools per week and severe constipation as less than one stool per week.  One of the most common issues patients have following surgery is constipation.  Even if you have a regular bowel pattern at home, your normal regimen is likely to be disrupted due to multiple reasons following surgery.  Combination of anesthesia, postoperative narcotics, change in appetite and fluid intake all can affect your bowels.  In order to avoid complications following surgery, here are some recommendations in order to help you during your recovery period.  Colace (docusate) - Pick up an over-the-counter form of Colace or another stool softener and take twice a day as long as you are requiring postoperative pain medications.  Take with a full glass of water daily.  If you experience loose stools or diarrhea, hold the colace until  you stool forms back up.  If your symptoms do not get better within 1 week or if they get worse, check with your doctor.  Dulcolax (bisacodyl) - Pick up over-the-counter and take as directed by the product packaging as needed to assist with the movement of your bowels.  Take with a full glass of water.  Use this product as needed if not relieved by Colace only.   MiraLax (polyethylene glycol) - Pick up over-the-counter to have on hand.  MiraLax is a solution that will increase the amount of water in your bowels to assist with bowel movements.  Take as directed and can mix with a glass of water, juice, soda, coffee, or tea.  Take if you go more than two days without a movement. Do not use MiraLax more than once per day. Call your doctor if you are still constipated or irregular after using this medication for 7 days in a row.  If you continue to have problems with postoperative constipation, please contact the office for further  assistance and recommendations.  If you experience "the worst abdominal pain ever" or develop nausea or vomiting, please contact the office immediatly for further recommendations for treatment.  ITCHING  If you experience itching with your medications, try taking only a single pain pill, or even half a pain pill at a time.  You can also use Benadryl over the counter for itching or also to help with sleep.   TED HOSE STOCKINGS Wear the elastic stockings on both legs for two weeks following surgery during the day but you may remove then at night for sleeping.  Okay to remove ACE in 3 days, put TED on after  MEDICATIONS See your medication summary on the "After Visit Summary" that the nursing staff will review with you prior to discharge.  You may have some home medications which will be placed on hold until you complete the course of blood thinner medication.  It is important for you to complete the blood thinner medication as prescribed by your surgeon.  Continue your approved medications as instructed at time of discharge. If you were not previously taking any blood thinners prior to surgery please start taking Aspirin 325 mg tabs twice daily for 6 weeks. If you are unable to take Aspirin please let your doctor know.   PRECAUTIONS If you experience chest pain or shortness of breath - call 911 immediately for transfer to the hospital emergency department.  If you develop a fever greater that 101 F, purulent drainage from wound, increased redness or drainage from wound, foul odor from the wound/dressing, or calf pain - CONTACT YOUR SURGEON.                                                   FOLLOW-UP APPOINTMENTS Make sure you keep all of your appointments after your operation with your surgeon and caregivers. You should call the office at the above phone number and make an appointment for approximately two weeks after the date of your surgery or on the date instructed by your surgeon outlined in the  "After Visit Summary".   RANGE OF MOTION AND STRENGTHENING EXERCISES  Rehabilitation of the knee is important following a knee injury or an operation. After just a few days of immobilization, the muscles of the thigh which control the knee become weakened and  shrink (atrophy). Knee exercises are designed to build up the tone and strength of the thigh muscles and to improve knee motion. Often times heat used for twenty to thirty minutes before working out will loosen up your tissues and help with improving the range of motion but do not use heat for the first two weeks following surgery. These exercises can be done on a training (exercise) mat, on the floor, on a table or on a bed. Use what ever works the best and is most comfortable for you Knee exercises include:  Leg Lifts - While your knee is still immobilized in a splint or cast, you can do straight leg raises. Lift the leg to 60 degrees, hold for 3 sec, and slowly lower the leg. Repeat 10-20 times 2-3 times daily. Perform this exercise against resistance later as your knee gets better.  Quad and Hamstring Sets - Tighten up the muscle on the front of the thigh (Quad) and hold for 5-10 sec. Repeat this 10-20 times hourly. Hamstring sets are done by pushing the foot backward against an object and holding for 5-10 sec. Repeat as with quad sets.  Leg Slides: Lying on your back, slowly slide your foot toward your buttocks, bending your knee up off the floor (only go as far as is comfortable). Then slowly slide your foot back down until your leg is flat on the floor again. Angel Wings: Lying on your back spread your legs to the side as far apart as you can without causing discomfort.  A rehabilitation program following serious knee injuries can speed recovery and prevent re-injury in the future due to weakened muscles. Contact your doctor or a physical therapist for more information on knee rehabilitation.   IF YOU ARE TRANSFERRED TO A SKILLED REHAB  FACILITY If the patient is transferred to a skilled rehab facility following release from the hospital, a list of the current medications will be sent to the facility for the patient to continue.  When discharged from the skilled rehab facility, please have the facility set up the patient's [place] prior to being released. Also, the skilled facility will be responsible for providing the patient with their medications at time of release from the facility to include their pain medication, the muscle relaxants, and their blood thinner medication. If the patient is still at the rehab facility at time of the two week follow up appointment, the skilled rehab facility will also need to assist the patient in arranging follow up appointment in our office and any transportation needs.  MAKE SURE YOU:  Understand these instructions.  Get help right away if you are not doing well or get worse.    Pick up stool softner and laxative for home use following surgery while on pain medications. May shower starting three days after surgery. Please use a clean towel to pat the leg dry following showers. Continue to use ice for pain and swelling after surgery. Do not use any lotions or creams on the incision until instructed by you Start Aspirin immediately following surgery.   Do not put a pillow under the knee. Place it under the heel.   Complete by: As directed    Driving restrictions   Complete by: As directed    No driving for two weeks   Post-operative opioid taper instructions:   Complete by: As directed    POST-OPERATIVE OPIOID TAPER INSTRUCTIONS: It is important to wean off of your opioid medication as soon as possible. If  you do not need pain medication after your surgery it is ok to stop day one. Opioids include: Codeine, Hydrocodone(Norco, Vicodin), Oxycodone(Percocet, oxycontin) and hydromorphone amongst others.  Long term and even short term use of opiods can cause: Increased  pain response Dependence Constipation Depression Respiratory depression And more.  Withdrawal symptoms can include Flu like symptoms Nausea, vomiting And more Techniques to manage these symptoms Hydrate well Eat regular healthy meals Stay active Use relaxation techniques(deep breathing, meditating, yoga) Do Not substitute Alcohol to help with tapering If you have been on opioids for less than two weeks and do not have pain than it is ok to stop all together.  Plan to wean off of opioids This plan should start within one week post op of your joint replacement. Maintain the same interval or time between taking each dose and first decrease the dose.  Cut the total daily intake of opioids by one tablet each day Next start to increase the time between doses. The last dose that should be eliminated is the evening dose.      TED hose   Complete by: As directed    Use stockings (TED hose) for three weeks on both leg(s).  You may remove them at night for sleeping.   Weight bearing as tolerated   Complete by: As directed       Allergies as of 12/19/2021       Reactions   Ezetimibe Other (See Comments)   Joint pain and drops BP   Niacin Other (See Comments)   Drops BP and severe joint/muscle pain   Statins Other (See Comments)   Severe joint pain and drops BP.        Medication List     TAKE these medications    acetaminophen 500 MG tablet Commonly known as: TYLENOL Take 1,000 mg by mouth 3 (three) times daily.   AIRBORNE GUMMIES PO Take 3 capsules by mouth 2 (two) times daily.   aspirin EC 81 MG tablet Take 81 mg by mouth in the morning.   bisacodyl 5 MG EC tablet Commonly known as: DULCOLAX Take 1 tablet (5 mg total) by mouth daily as needed for moderate constipation.   carvedilol 6.25 MG tablet Commonly known as: COREG Take 0.5 tablets (3.125 mg total) by mouth 2 (two) times daily with a meal. Must have office visit for additional refills [phone]    cetirizine 10 MG tablet Commonly known as: ZYRTEC Take 10 mg by mouth daily as needed for allergies.   clopidogrel 75 MG tablet Commonly known as: PLAVIX Take 1 tablet (75 mg total) by mouth daily with breakfast.   Deep Sleep Caps Take 2 capsules by mouth at bedtime.   esomeprazole 20 MG capsule Commonly known as: NEXIUM Take 20 mg by mouth daily at 12 noon.   furosemide 40 MG tablet Commonly known as: LASIX Take 1 tablet (40 mg total) by mouth daily. What changed:  how much to take when to take this   methocarbamol 500 MG tablet Commonly known as: ROBAXIN Take 1 tablet (500 mg total) by mouth every 6 (six) hours as needed for muscle spasms.   nitroGLYCERIN 0.4 MG SL tablet Commonly known as: NITROSTAT Place 1 tablet (0.4 mg total) under the tongue every 5 (five) minutes x 3 doses as needed for chest pain.   ondansetron 4 MG tablet Commonly known as: ZOFRAN Take 1 tablet (4 mg total) by mouth every 6 (six) hours as needed for nausea.   oxyCODONE 5  MG immediate release tablet Commonly known as: Oxy IR/ROXICODONE Take 1-2 tablets (5-10 mg total) by mouth every 4 (four) hours as needed for severe pain (pain score 4-6).   pantoprazole 40 MG tablet Commonly known as: PROTONIX TAKE 1 TABLET BY MOUTH EVERY DAY   Repatha SureClick [id number] MG/ML Soaj Generic drug: Evolocumab INJECT INTO SKIN EVERY 14 DAYS What changed: See the new instructions.   SUPER C COMPLEX PO Take 1 tablet by mouth 3 (three) times daily.   traMADol 50 MG tablet Commonly known as: ULTRAM Take 1 tablet (50 mg total) by mouth every 6 (six) hours.   valsartan 80 MG tablet Commonly known as: DIOVAN Take 1 tablet (80 mg total) by mouth daily.   VICKS VAPOINHALER IN Inhale 1 puff into the lungs daily as needed (congestion).   zolpidem 10 MG tablet Commonly known as: AMBIEN TAKE 1 TABLET BY MOUTH NIGHTLY AT BEDTIME               Discharge Care Instructions  (From admission, onward)            Start     Ordered   12/19/21 0000  Weight bearing as tolerated        12/19/21 0726            Follow-up Information     Sydnee Cabal, MD. Go on 01/02/2022.   Specialty: Orthopedic Surgery Why: You are scheduled for first post op appt on Friday Dec 29 at 9:30am. Contact information: [address] [place] [address] [phone]                 Signed: Drue Novel 12/19/2021, 7:27 AM

## 2021-12-19 NOTE — Plan of Care (Signed)

## 2021-12-19 NOTE — Progress Notes (Signed)
Subjective: 2 Days Post-Op Procedure(s) (LRB): TOTAL KNEE ARTHROPLASTY (Right) Patient reports pain as 6 on 0-10 scale.  He has been getting oxycodone and pain meds scheduled He was able to get up with PT yesterday and ambulate in the hallway No events over night  Objective: Vital signs in last 24 hours: Temp:  [97.6 F (36.4 C)-98 F (36.7 C)] 98 F (36.7 C) (12/15 0548) Pulse Rate:  [73-86] 73 (12/15 0548) Resp:  [16-18] 17 (12/15 0548) BP: (115-147)/(62-78) 115/63 (12/15 0548) SpO2:  [93 %-98 %] 98 % (12/15 0548)  Intake/Output from previous day: 12/14 0701 - 12/15 0700 In: 240 [P.O.:240] Out: 1800 [Urine:1800] Intake/Output this shift: No intake/output data recorded.  Recent Labs    12/17/21 1647 12/18/21 0340  HGB 13.3 13.1   Recent Labs    12/17/21 1647 12/18/21 0340  WBC 8.2 12.9*  RBC 4.05* 4.01*  HCT 40.9 40.0  PLT 177 222   Recent Labs    12/17/21 1647 12/18/21 0340  NA 139 139  K 4.1 5.1  CL 104 102  CO2 27 28  BUN 20 24*  CREATININE 0.95 1.11  GLUCOSE 159* 145*  CALCIUM 8.5* 9.0   No results for input(s): "LABPT", "INR" in the last 72 hours.  Neurologically intact Neurovascular intact Sensation intact distally Intact pulses distally Dorsiflexion/Plantar flexion intact Incision: dressing C/D/I Compartment soft   Assessment/Plan: 2 Days Post-Op Procedure(s) (LRB): TOTAL KNEE ARTHROPLASTY (Right) Advance diet Up with therapy Discharge home today will start OPPT on Monday DVT ppx: resume home blood thinner regimen  Follow up in the office in 2 weeks.      Patient's anticipated LOS is less than 2 midnights, meeting these requirements: - Younger than 6 - Lives within 1 hour of care - Has a competent adult at home to recover with post-op recover - NO history of  - Chronic pain requiring opiods  - Diabetes  - Coronary Artery Disease  - Heart failure  - Heart attack  - Stroke  - DVT/VTE  - Cardiac arrhythmia  - Respiratory  Failure/COPD  - Renal failure  - Anemia  - Advanced Liver disease     Berda Shelvin R Eion Timbrook 12/19/2021, 7:19 AM

## 2021-12-19 NOTE — TOC Transition Note (Addendum)
Transition of Care [facility]) - CM/SW Discharge Note  Patient Details  Name: [name] MRN: [id number] Date of Birth: 1955/02/15  Transition of Care [facility]) CM/SW Contact:  Ewing Schlein, LCSW Phone Number: 12/19/2021, 9:50 AM  Clinical Narrative: CSW notified by ortho PA that patient's insurance will not cover a hospital bed, so patient would have to private pay and orders were not placed at this time. CSW updated patient regarding hospital bed. TOC signing off.  Addendum: PT notified CSW that due to cardiopulmonary issues, the patient will need a hospital bed at home. CSW spoke with Noreene Larsson at [employer] and she will follow notify the ortho team. CSW followed up with Belenda Cruise with [employer] and a cosigned PT note will work as the required narrative. Patient will also need a hospital bed order. CSW updated patient.  Final next level of care: OP Rehab Barriers to Discharge: No Barriers Identified  Patient Goals and CMS Choice Patient states their goals for this hospitalization and ongoing recovery are:: Discharge home with OPPT at [facility] CMS Medicare.gov Compare Post Acute Care list provided to:: Patient Choice offered to / list presented to : Patient  Discharge Plan and Services In-house Referral: Clinical Social Work Post Acute Care Choice: Durable Medical Equipment          DME Arranged: Dan Humphreys rolling DME Agency: [employer] Representative spoke with at DME Agency: Prearranged in orthopedist's office  Social Determinants of Health (SDOH) Interventions    Readmission Risk Interventions     No data to display

## 2021-12-19 NOTE — Progress Notes (Signed)
Physical Therapy Treatment Patient Details Name: [name] MRN: [id number] DOB: 1955-05-04 Today's Date: 12/19/2021   History of Present Illness Patient is 66 y.o. male s/p Rt TKA on 12/17/21 with PMH significant for anxiety, OA, CHF, CAD, GERD, HTN, LBBB, hx of MI a/p CABG and with AICD, hx of ACDF, back surgery.    PT Comments    Patient progressing with mobility and ambulated increased distance of ~140' with min guard and RW. Frequent standing breaks required due to fatigue/SOB. Pt complete stair training again with RW and rail/crutch. Min-Mod assist required for safety to stabilize AD for step negotiation, spouse present for safe guarding/assist with cues and assist from therapist as well. Pt BP stable during session but noted to become slightly pale after stair training and gait. Vitals assessed and pt noted to be bradycardic with HR in 50's after stairs and SpO2 88% on RA. SpO2 recovered to 90% but HR continued to be low for several minutes with low of 34. Recovered back to 60-80's after ~2 minutes rest and SpO2 recovered to 92%. RN notified. Pt returned to bed EOS after additional bout of gait to bathroom. Pt noted to be SOB in supine. HOB required to be elevated to 30 degrees to maintain SpO2 of 92% or greater and prevent SOB. Continue to recommend hospital bed to improve safety of pt with return home as he is unsafe to ascend flight of stairs to bedrooms and unable to sleep on requires sofa as he requires HOB to be elevated due to  cardiopulmonary status. As pt will be confined to room on main level he will also require a BSC to allow for safe toileting. Will continue to progress pt as able for safe return home.   Patient is not able to walk the distance required to go the bathroom on the second floor and will be confined alone to a single room for prolonged time. A 3-in-1 BSC will alleviate this problem.  Patient suffers from CHF and has trouble breathing at night when head is elevated  less than 30 degrees. Bed wedges do not provide enough elevation to resolve breathing issues. SOB and increased work of breathing cause patient to require frequent changes in body position which cannont be achieved with normal bed.     Recommendations for follow up therapy are one component of a multi-disciplinary discharge planning process, led by the attending physician.  Recommendations may be updated based on patient status, additional functional criteria and insurance authorization.  Follow Up Recommendations  Follow physician's recommendations for discharge plan and follow up therapies     Assistance Recommended at Discharge Frequent or constant Supervision/Assistance  Patient can return home with the following A little help with walking and/or transfers;A little help with bathing/dressing/bathroom;Assistance with cooking/housework;Direct supervision/assist for medications management;Assist for transportation;Help with stairs or ramp for entrance   Equipment Recommendations  Rolling walker (2 wheels)    Recommendations for Other Services       Precautions / Restrictions Precautions Precautions: Fall Restrictions Weight Bearing Restrictions: No Other Position/Activity Restrictions: WBAT     Mobility  Bed Mobility Overal bed mobility: Needs Assistance Bed Mobility: Sit to Supine     Supine to sit: Min guard, HOB elevated Sit to supine: Min assist   General bed mobility comments: Min assist to raise Rt LE back onto bed at EOS    Transfers Overall transfer level: Needs assistance Equipment used: Rolling walker (2 wheels) Transfers: Sit to/from Stand Sit to Stand: From elevated surface,  Min guard   Step pivot transfers: Min guard       General transfer comment: pt completed sit<>stand from recliner multiple times and from [facility] over toilet. min guard for safety, pt able to power up with bil UE's.    Ambulation/Gait Ambulation/Gait assistance: Min guard, Min  assist Gait Distance (Feet): 140 Feet Assistive device: Rolling walker (2 wheels) Gait Pattern/deviations: Decreased stride length, Step-through pattern, Step-to pattern Gait velocity: decr     General Gait Details: guarding throughout gait, no LOB noted. pt's spouse present and provided guarding during portions of gait with cues from therapist. no LOB noted or buckling at Rt knee. Pt intermittently SOB and fatigued requiring standing rest breaks.   Stairs Stairs: Yes Stairs assistance: Min assist, Mod assist Stair Management: No rails, Step to pattern, One rail Left, With crutches, With walker, Forwards Number of Stairs: 2 (2x2) General stair comments: cues for safe pattern to ascend/descend with sinlge rail and crutch, Mod assist to steady balance for 2 stairs. Min-mod assist to stabilize walker for stair mobility with RW for support to ascend/descend 2 steps. Pt's wife providing assistance as well. Pt greatly fatigued after stair training. slightly peaky color, HR noted to be bradycardic in 40's-50's. SpO2 88% on RA. Returned to room. BP stable in 130's/60's. pt recovered to 92% on RA but HR continued to fluctuate while pt resting. Prolonged rest provided and pt ambualted to bathroom and back to bed with min assist.   Wheelchair Mobility    Modified Rankin (Stroke Patients Only)       Balance Overall balance assessment: Needs assistance Sitting-balance support: Feet supported Sitting balance-Leahy Scale: Fair     Standing balance support: Reliant on assistive device for balance, During functional activity, Bilateral upper extremity supported Standing balance-Leahy Scale: Poor                              Cognition Arousal/Alertness: Awake/alert Behavior During Therapy: WFL for tasks assessed/performed Overall Cognitive Status: Within Functional Limits for tasks assessed                                          Exercises Total Joint  Exercises Ankle Circles/Pumps: AROM, Both, 20 reps Quad Sets: Both, AROM, 10 reps Heel Slides: AROM, Right, 10 reps Hip ABduction/ADduction: AROM, 10 reps, Right    General Comments        Pertinent Vitals/Pain Pain Assessment Pain Assessment: 0-10 Pain Score: 4  Pain Location: Rt knee Pain Descriptors / Indicators: Aching, Discomfort Pain Intervention(s): Limited activity within patient's tolerance, Monitored during session, Ice applied, Repositioned    Home Living                          Prior Function            PT Goals (current goals can now be found in the care plan section) Acute Rehab PT Goals Patient Stated Goal: regain independence and knee to stop hurting. PT Goal Formulation: With patient Time For Goal Achievement: 12/24/21 Potential to Achieve Goals: Good Progress towards PT goals: Progressing toward goals    Frequency    7X/week      PT Plan Current plan remains appropriate    Co-evaluation              AM-PAC  PT "6 Clicks" Mobility   Outcome Measure  Help needed turning from your back to your side while in a flat bed without using bedrails?: A Little Help needed moving from lying on your back to sitting on the side of a flat bed without using bedrails?: A Little Help needed moving to and from a bed to a chair (including a wheelchair)?: A Little Help needed standing up from a chair using your arms (e.g., wheelchair or bedside chair)?: A Little Help needed to walk in hospital room?: A Little Help needed climbing 3-5 steps with a railing? : A Little 6 Click Score: 18    End of Session Equipment Utilized During Treatment: Gait belt Activity Tolerance: Patient tolerated treatment well Patient left: in bed;with call bell/phone within reach;with nursing/sitter in room;with family/visitor present Nurse Communication: Mobility status;Other (comment) (severe hypotension - RN in room and RR called.) PT Visit Diagnosis: Muscle weakness  (generalized) (M62.81);Unsteadiness on feet (R26.81);Other abnormalities of gait and mobility (R26.89);Difficulty in walking, not elsewhere classified (R26.2);Dizziness and giddiness (R42)     Time: 1355-1459 PT Time Calculation (min) (ACUTE ONLY): 64 min  Charges:  $Gait Training: 23-37 mins $Therapeutic Activity: 23-37 mins                     Verner Mould, DPT Acute Rehabilitation Services Office [phone]  12/19/21 3:42 PM

## 2021-12-19 NOTE — Plan of Care (Signed)
  Problem: Nutrition: Goal: Adequate nutrition will be maintained Outcome: Progressing   

## 2021-12-20 DIAGNOSIS — Z981 Arthrodesis status: Secondary | ICD-10-CM | POA: Diagnosis not present

## 2021-12-20 DIAGNOSIS — Z8249 Family history of ischemic heart disease and other diseases of the circulatory system: Secondary | ICD-10-CM | POA: Diagnosis not present

## 2021-12-20 DIAGNOSIS — M1711 Unilateral primary osteoarthritis, right knee: Secondary | ICD-10-CM | POA: Diagnosis present

## 2021-12-20 DIAGNOSIS — G4700 Insomnia, unspecified: Secondary | ICD-10-CM | POA: Diagnosis present

## 2021-12-20 DIAGNOSIS — I110 Hypertensive heart disease with heart failure: Secondary | ICD-10-CM | POA: Diagnosis present

## 2021-12-20 DIAGNOSIS — Z833 Family history of diabetes mellitus: Secondary | ICD-10-CM | POA: Diagnosis not present

## 2021-12-20 DIAGNOSIS — Z7982 Long term (current) use of aspirin: Secondary | ICD-10-CM | POA: Diagnosis not present

## 2021-12-20 DIAGNOSIS — Z955 Presence of coronary angioplasty implant and graft: Secondary | ICD-10-CM | POA: Diagnosis not present

## 2021-12-20 DIAGNOSIS — E7800 Pure hypercholesterolemia, unspecified: Secondary | ICD-10-CM | POA: Diagnosis present

## 2021-12-20 DIAGNOSIS — Z6829 Body mass index (BMI) 29.0-29.9, adult: Secondary | ICD-10-CM | POA: Diagnosis not present

## 2021-12-20 DIAGNOSIS — Z7984 Long term (current) use of oral hypoglycemic drugs: Secondary | ICD-10-CM | POA: Diagnosis not present

## 2021-12-20 DIAGNOSIS — I2510 Atherosclerotic heart disease of native coronary artery without angina pectoris: Secondary | ICD-10-CM | POA: Diagnosis present

## 2021-12-20 DIAGNOSIS — Z79899 Other long term (current) drug therapy: Secondary | ICD-10-CM | POA: Diagnosis not present

## 2021-12-20 DIAGNOSIS — I5022 Chronic systolic (congestive) heart failure: Secondary | ICD-10-CM | POA: Diagnosis present

## 2021-12-20 DIAGNOSIS — E669 Obesity, unspecified: Secondary | ICD-10-CM | POA: Diagnosis present

## 2021-12-20 DIAGNOSIS — Z95810 Presence of automatic (implantable) cardiac defibrillator: Secondary | ICD-10-CM | POA: Diagnosis not present

## 2021-12-20 DIAGNOSIS — Z87891 Personal history of nicotine dependence: Secondary | ICD-10-CM | POA: Diagnosis not present

## 2021-12-20 DIAGNOSIS — Z7902 Long term (current) use of antithrombotics/antiplatelets: Secondary | ICD-10-CM | POA: Diagnosis not present

## 2021-12-20 DIAGNOSIS — Z803 Family history of malignant neoplasm of breast: Secondary | ICD-10-CM | POA: Diagnosis not present

## 2021-12-20 DIAGNOSIS — Z888 Allergy status to other drugs, medicaments and biological substances status: Secondary | ICD-10-CM | POA: Diagnosis not present

## 2021-12-20 DIAGNOSIS — I252 Old myocardial infarction: Secondary | ICD-10-CM | POA: Diagnosis not present

## 2021-12-20 DIAGNOSIS — K219 Gastro-esophageal reflux disease without esophagitis: Secondary | ICD-10-CM | POA: Diagnosis present

## 2021-12-20 DIAGNOSIS — Z951 Presence of aortocoronary bypass graft: Secondary | ICD-10-CM | POA: Diagnosis not present

## 2021-12-20 DIAGNOSIS — Z823 Family history of stroke: Secondary | ICD-10-CM | POA: Diagnosis not present

## 2021-12-20 MED ORDER — HYDROCODONE-ACETAMINOPHEN 5-325 MG PO TABS
1.0000 | ORAL_TABLET | Freq: Four times a day (QID) | ORAL | Status: DC | PRN
  Administered 2021-12-20: 2 via ORAL
  Administered 2021-12-20 – 2021-12-21 (×3): 1 via ORAL
  Administered 2021-12-21 – 2021-12-22 (×3): 2 via ORAL
  Filled 2021-12-20: qty 1
  Filled 2021-12-20: qty 2
  Filled 2021-12-20 (×2): qty 1
  Filled 2021-12-20 (×3): qty 2

## 2021-12-20 NOTE — Progress Notes (Signed)
Physical Therapy Treatment Patient Details Name: [name] MRN: [id number] DOB: 04-Nov-1955 Today's Date: 12/20/2021   History of Present Illness Patient is 66 y.o. male s/p Rt TKA on 12/17/21 with PMH significant for anxiety, OA, CHF, CAD, GERD, HTN, LBBB, hx of MI a/p CABG and with AICD, hx of ACDF, back surgery.    PT Comments    POD # 3 Attempted to amb in hallway failed.  General Gait Details: only amb 4 feet to sink due to request to void.  Had pt lean up gainst sink to use urinal.  Spouse present and assisted.  HR 99.  Standing BP 119/74  Pt still feeling "drunk" and "tired".  Too fatigue to amb in hallway after using urinal standing.  Returned to [employer] Spouse concerned that pt did NOT eat breakfast.   Will attempt to see pt again later today.  RN reported pain meds were changed from OXY to [facility].  Continue to monitor HR/BP.   Recommendations for follow up therapy are one component of a multi-disciplinary discharge planning process, led by the attending physician.  Recommendations may be updated based on patient status, additional functional criteria and insurance authorization.  Follow Up Recommendations  Follow physician's recommendations for discharge plan and follow up therapies     Assistance Recommended at Discharge Frequent or constant Supervision/Assistance  Patient can return home with the following A little help with walking and/or transfers;A little help with bathing/dressing/bathroom;Assistance with cooking/housework;Direct supervision/assist for medications management;Assist for transportation;Help with stairs or ramp for entrance   Equipment Recommendations  Rolling walker (2 wheels)    Recommendations for Other Services       Precautions / Restrictions Precautions Precautions: Fall Precaution Comments: No pillow under knee Restrictions Weight Bearing Restrictions: No RLE Weight Bearing: Weight bearing as tolerated     Mobility  Bed Mobility Overal  bed mobility: Needs Assistance Bed Mobility: Supine to Sit     Supine to sit: Min assist, Mod assist     General bed mobility comments: OOB in recliner    Transfers Overall transfer level: Needs assistance Equipment used: Rolling walker (2 wheels) Transfers: Sit to/from Stand Sit to Stand: From elevated surface, Min assist, Mod assist           General transfer comment: required increased time/effort and VC's to rise from recliner.    Ambulation/Gait Ambulation/Gait assistance: Min assist Gait Distance (Feet): 4 Feet Assistive device: Rolling walker (2 wheels) Gait Pattern/deviations: Decreased stride length, Step-through pattern, Step-to pattern Gait velocity: decreased     General Gait Details: only amb 4 feet to sink due to request to void.  Had pt lean up gainst sink to use urinal.  Spouse present and assisted.  HR 99.  Pt still feeling "drunk" and "tired".  Too fatigue to amb in hallway after using urinal standing.  Returned to [employer]   Stairs             Wheelchair Mobility    Modified Rankin (Stroke Patients Only)       Balance                                            Cognition Arousal/Alertness: Awake/alert Behavior During Therapy: WFL for tasks assessed/performed Overall Cognitive Status: Within Functional Limits for tasks assessed  General Comments: AxO x 3 pleasant but feeling "drunk" and admited to having difficulty focusing requiring repeat VC's to complete task at hand.        Exercises      General Comments        Pertinent Vitals/Pain Pain Assessment Pain Assessment: 0-10 Pain Score: 7  Pain Location: Rt knee Pain Descriptors / Indicators: Aching, Discomfort, Tender, Operative site guarding Pain Intervention(s): Monitored during session, Premedicated before session, Repositioned, Ice applied    Home Living                          Prior  Function            PT Goals (current goals can now be found in the care plan section) Progress towards PT goals: Progressing toward goals    Frequency    7X/week      PT Plan Current plan remains appropriate    Co-evaluation              AM-PAC PT "6 Clicks" Mobility   Outcome Measure  Help needed turning from your back to your side while in a flat bed without using bedrails?: A Little Help needed moving from lying on your back to sitting on the side of a flat bed without using bedrails?: A Little Help needed moving to and from a bed to a chair (including a wheelchair)?: A Lot Help needed standing up from a chair using your arms (e.g., wheelchair or bedside chair)?: A Lot Help needed to walk in hospital room?: A Lot Help needed climbing 3-5 steps with a railing? : A Lot 6 Click Score: 14    End of Session Equipment Utilized During Treatment: Gait belt Activity Tolerance: Patient tolerated treatment well Patient left: in chair;with call bell/phone within reach Nurse Communication: Mobility status;Other (comment) PT Visit Diagnosis: Muscle weakness (generalized) (M62.81);Unsteadiness on feet (R26.81);Other abnormalities of gait and mobility (R26.89);Difficulty in walking, not elsewhere classified (R26.2);Dizziness and giddiness (R42)     Time: 2595-6387 PT Time Calculation (min) (ACUTE ONLY): 25 min  Charges:  $Gait Training: 8-22 mins $Therapeutic Activity: 8-22 mins                     Felecia Shelling  PTA Acute  Rehabilitation Services Office M-F          [phone] Weekend pager [phone]

## 2021-12-20 NOTE — Progress Notes (Signed)
PHYSICAL THERAPY  Spouse reported pt took a 3 hour nap and "looks better".  Currently eating a late lunch.  If unable to return later due to schedule, Therapy will see pt in morning.  Encouraged OOB later with staff.  Felecia Shelling  PTA Acute  Rehabilitation Services Office M-F          [phone] Weekend pager [phone]

## 2021-12-20 NOTE — Progress Notes (Addendum)
PHYSICAL THERAPY  Assisted OOB to amb to bathroom was difficult.  Pt present with"drunken gait" and impaired coordination along with c/o feeling "goofy".  Required repeat instructions and "foggy".  Vitals WNL HR 99 BP soft 102/ 62 NO c/o dizziness just "drunk"  Pt was given 10 mg OXY prior to control 7/10 knee pain which decreased to 5/10.  Pt stated "I don't like this feeling" (pain meds)  Consulted RN may be effects of OXY, message MD/PA for alternate pain meds  Felecia Shelling  PTA Acute  Rehabilitation Services Office M-F          [phone] Weekend pager [phone]

## 2021-12-20 NOTE — Progress Notes (Signed)
   Subjective: 3 Days Post-Op Procedure(s) (LRB): TOTAL KNEE ARTHROPLASTY (Right)  Pt c/o moderate pain in the right knee Pt needs multiple cues during walking and therapy Denies any new symptoms overnight Patient reports pain as moderate.  Objective:   VITALS:   Vitals:   12/19/21 2249 12/20/21 0404  BP: (!) 124/55 (!) 146/67  Pulse: 83 86  Resp: 18 16  Temp: 97.9 F (36.6 C) 97.8 F (36.6 C)  SpO2:  96%    Right knee incision healing well Nv intact distally Guarded rom No rashes, but slight edema distally   LABS Recent Labs    12/17/21 1647 12/18/21 0340  HGB 13.3 13.1  HCT 40.9 40.0  WBC 8.2 12.9*  PLT 177 222    Recent Labs    12/17/21 1647 12/18/21 0340  NA 139 139  K 4.1 5.1  BUN 20 24*  CREATININE 0.95 1.11  GLUCOSE 159* 145*     Assessment/Plan: 3 Days Post-Op Procedure(s) (LRB): TOTAL KNEE ARTHROPLASTY (Right) Continue PT/OT Must past PT prior to d/c home Pain management Pulmonary toilet     Brad Antonieta Iba, MPAS [facility] is now [facility]  [place] [address], [address], [place], [place] [address] Phone: [phone] [email or web address] Facebook  [employer]

## 2021-12-20 NOTE — Progress Notes (Signed)
Physical Therapy Treatment Patient Details Name: [name] MRN: [id number] DOB: [birth year] Today's Date: 12/20/2021   History of Present Illness Patient is 66 y.o. male s/p Rt TKA on 12/17/21 with PMH significant for anxiety, OA, CHF, CAD, GERD, HTN, LBBB, hx of MI a/p CABG and with AICD, hx of ACDF, back surgery.    PT Comments    POD # 3 am session Assisted OOB to amb to bathroom was difficult.  General transfer comment: required increased time and repeat VC's to stay on task.  Asissted with a toilet transfer pt repeated "what do I do?".  VC's on safety with turns as well as standing back up against sink to stand and void.  Pt voided 175.  General Gait Details: decreased amb distance this session due to "grogginess", unsteady gait and c/o feeling "drunk".  Only amb to and from bathroom. Alerted RN.   Recommendations for follow up therapy are one component of a multi-disciplinary discharge planning process, led by the attending physician.  Recommendations may be updated based on patient status, additional functional criteria and insurance authorization.  Follow Up Recommendations  Follow physician's recommendations for discharge plan and follow up therapies     Assistance Recommended at Discharge Frequent or constant Supervision/Assistance  Patient can return home with the following A little help with walking and/or transfers;A little help with bathing/dressing/bathroom;Assistance with cooking/housework;Direct supervision/assist for medications management;Assist for transportation;Help with stairs or ramp for entrance   Equipment Recommendations  Rolling walker (2 wheels)    Recommendations for Other Services       Precautions / Restrictions Precautions Precautions: Fall Precaution Comments: No pillow under knee Restrictions Weight Bearing Restrictions: No RLE Weight Bearing: Weight bearing as tolerated     Mobility  Bed Mobility Overal bed mobility: Needs Assistance Bed  Mobility: Supine to Sit     Supine to sit: Min assist, Mod assist     General bed mobility comments: demonstarted and instructed how to use belt to self assist LE.  Pt had difficulty coordinating and required increased time to compete task.    Transfers Overall transfer level: Needs assistance Equipment used: Rolling walker (2 wheels) Transfers: Sit to/from Stand Sit to Stand: From elevated surface, Min assist, Mod assist           General transfer comment: required increased time and repeat VC's to stay on task.  Asissted with a toilet transfer pt repeated "what do I do?".  VC's on safety with turns as well as standing back up against sink to stand and void.  Pt voided 175.    Ambulation/Gait Ambulation/Gait assistance: Min assist Gait Distance (Feet): 18 Feet Assistive device: Rolling walker (2 wheels) Gait Pattern/deviations: Decreased stride length, Step-through pattern, Step-to pattern Gait velocity: decreased     General Gait Details: decreased amb distance this session due to "grogginess", unsteady gait and c/o feeling "drunk".  Only amb to and from bathroom.   Stairs             Wheelchair Mobility    Modified Rankin (Stroke Patients Only)       Balance                                            Cognition Arousal/Alertness: Awake/alert Behavior During Therapy: WFL for tasks assessed/performed Overall Cognitive Status: Within Functional Limits for tasks assessed  General Comments: AxO x 3 pleasant but feeling "drunk" and admited to having difficulty focusing requiring repeat VC's to complete task at hand.        Exercises      General Comments        Pertinent Vitals/Pain Pain Assessment Pain Assessment: 0-10 Pain Score: 7  Pain Location: Rt knee Pain Descriptors / Indicators: Aching, Discomfort, Tender, Operative site guarding Pain Intervention(s): Monitored during  session, Premedicated before session, Repositioned, Ice applied    Home Living                          Prior Function            PT Goals (current goals can now be found in the care plan section) Progress towards PT goals: Progressing toward goals    Frequency    7X/week      PT Plan Current plan remains appropriate    Co-evaluation              AM-PAC PT "6 Clicks" Mobility   Outcome Measure  Help needed turning from your back to your side while in a flat bed without using bedrails?: A Little Help needed moving from lying on your back to sitting on the side of a flat bed without using bedrails?: A Little Help needed moving to and from a bed to a chair (including a wheelchair)?: A Lot Help needed standing up from a chair using your arms (e.g., wheelchair or bedside chair)?: A Lot Help needed to walk in hospital room?: A Lot Help needed climbing 3-5 steps with a railing? : A Lot 6 Click Score: 14    End of Session Equipment Utilized During Treatment: Gait belt Activity Tolerance: Patient tolerated treatment well Patient left: in chair;with call bell/phone within reach Nurse Communication: Mobility status;Other (comment) PT Visit Diagnosis: Muscle weakness (generalized) (M62.81);Unsteadiness on feet (R26.81);Other abnormalities of gait and mobility (R26.89);Difficulty in walking, not elsewhere classified (R26.2);Dizziness and giddiness (R42)     Time: 6553-7482 PT Time Calculation (min) (ACUTE ONLY): 26 min  Charges:  $Gait Training: 8-22 mins $Therapeutic Activity: 8-22 mins                     Felecia Shelling  PTA Acute  Rehabilitation Services Office M-F          [phone] Weekend pager [phone]

## 2021-12-21 MED ORDER — ZOLPIDEM TARTRATE 5 MG PO TABS
5.0000 mg | ORAL_TABLET | Freq: Once | ORAL | Status: AC
  Administered 2021-12-21: 5 mg via ORAL
  Filled 2021-12-21: qty 1

## 2021-12-21 NOTE — Plan of Care (Signed)

## 2021-12-21 NOTE — Plan of Care (Signed)
  Problem: Education: Goal: Knowledge of General Education information will improve Description: Including pain rating scale, medication(s)/side effects and non-pharmacologic comfort measures Outcome: Progressing   Problem: Activity: Goal: Risk for activity intolerance will decrease Outcome: Progressing   Problem: Coping: Goal: Level of anxiety will decrease Outcome: Progressing   

## 2021-12-21 NOTE — Progress Notes (Signed)
Physical Therapy Treatment Patient Details Name: [name] MRN: [id number] DOB: 1955-06-05 Today's Date: 12/21/2021   History of Present Illness Patient is 66 y.o. male s/p Rt TKA on 12/17/21 with PMH significant for anxiety, OA, CHF, CAD, GERD, HTN, LBBB, hx of MI a/p CABG and with AICD, hx of ACDF, back surgery.    PT Comments    Pt agreeable to working with therapy. He reports moderate pain at rest 6/10 and 7/10 with activity. He required minimal physical assistance to mobilize this session. Monitored BP and HR with change in position-see vitals section in flowsheets-BP dropped to 91/54 with pt report of moderate lightheadedness/"wooziness". Deferred ambulation and assisted pt into recliner. Encouraged him to sit up and to drink plenty of fluids. Will continue to progress activity as safely able. Do not anticipate he will be discharging home on today. He has not been able to demonstrate consistent safe ability to mobilize.     Recommendations for follow up therapy are one component of a multi-disciplinary discharge planning process, led by the attending physician.  Recommendations may be updated based on patient status, additional functional criteria and insurance authorization.  Follow Up Recommendations  Follow physician's recommendations for discharge plan and follow up therapies     Assistance Recommended at Discharge Frequent or constant Supervision/Assistance  Patient can return home with the following A little help with walking and/or transfers;A little help with bathing/dressing/bathroom;Assistance with cooking/housework;Direct supervision/assist for medications management;Assist for transportation;Help with stairs or ramp for entrance   Equipment Recommendations  Rolling walker (2 wheels)    Recommendations for Other Services       Precautions / Restrictions Precautions Precautions: Fall Precaution Comments: No pillow under knee Restrictions Weight Bearing  Restrictions: No RLE Weight Bearing: Weight bearing as tolerated     Mobility  Bed Mobility Overal bed mobility: Needs Assistance Bed Mobility: Supine to Sit     Supine to sit: Min guard, HOB elevated     General bed mobility comments: Min guard A for mobility. Increased time.    Transfers Overall transfer level: Needs assistance Equipment used: Rolling walker (2 wheels) Transfers: Sit to/from Stand Sit to Stand: Min assist, From elevated surface   Step pivot transfers: Min guard       General transfer comment: Small amount of assist to rise. Increased time. Cues for safety, hand/LE placement. See vitals section in flowsheets for BP readings. Deferred ambulation and transferred to recliner only this session.    Ambulation/Gait                   Stairs             Wheelchair Mobility    Modified Rankin (Stroke Patients Only)       Balance Overall balance assessment: Needs assistance         Standing balance support: Reliant on assistive device for balance, During functional activity, Bilateral upper extremity supported Standing balance-Leahy Scale: Poor                              Cognition Arousal/Alertness: Awake/alert Behavior During Therapy: WFL for tasks assessed/performed Overall Cognitive Status: Within Functional Limits for tasks assessed                                          Exercises Total Joint Exercises Ankle  Circles/Pumps: AROM, Both, 10 reps Quad Sets: AROM, Both, 10 reps Heel Slides: AAROM, Right, 10 reps Hip ABduction/ADduction: AAROM, Right, 10 reps, AROM Straight Leg Raises: AAROM, AROM, Right, 10 reps Goniometric ROM: ~10-65 degrees    General Comments        Pertinent Vitals/Pain Pain Assessment Pain Assessment: 0-10 Pain Score: 7  Pain Location: R knee/thigh Pain Descriptors / Indicators: Aching, Discomfort, Tender, Operative site guarding Pain Intervention(s): Limited  activity within patient's tolerance, Monitored during session, Repositioned, Ice applied, Heat applied (heat to thigh)    Home Living                          Prior Function            PT Goals (current goals can now be found in the care plan section) Progress towards PT goals: Not progressing toward goals - comment (limited by pain, BP issues)    Frequency    7X/week      PT Plan Current plan remains appropriate    Co-evaluation              AM-PAC PT "6 Clicks" Mobility   Outcome Measure  Help needed turning from your back to your side while in a flat bed without using bedrails?: A Little Help needed moving from lying on your back to sitting on the side of a flat bed without using bedrails?: A Little Help needed moving to and from a bed to a chair (including a wheelchair)?: A Little Help needed standing up from a chair using your arms (e.g., wheelchair or bedside chair)?: A Little Help needed to walk in hospital room?: A Lot Help needed climbing 3-5 steps with a railing? : A Lot 6 Click Score: 16    End of Session Equipment Utilized During Treatment: Gait belt Activity Tolerance: Patient limited by pain (limted by drop in BP and pt moderately symptomatic) Patient left: in chair;with call bell/phone within reach;with family/visitor present   PT Visit Diagnosis: Muscle weakness (generalized) (M62.81);Unsteadiness on feet (R26.81);Other abnormalities of gait and mobility (R26.89);Difficulty in walking, not elsewhere classified (R26.2);Dizziness and giddiness (R42)     Time: 6073-7106 PT Time Calculation (min) (ACUTE ONLY): 41 min  Charges:  $Therapeutic Exercise: 8-22 mins $Therapeutic Activity: 23-37 mins                        Faye Ramsay, PT Acute Rehabilitation  Office: [phone]

## 2021-12-21 NOTE — Progress Notes (Addendum)
Physical Therapy Treatment Patient Details Name: DOCK BACCAM MRN: [id number] DOB: 10/14/1955 Today's Date: 12/21/2021   History of Present Illness Patient is 66 y.o. male s/p Rt TKA on 12/17/21 with PMH significant for anxiety, OA, CHF, CAD, GERD, HTN, LBBB, hx of MI a/p CABG and with AICD, hx of ACDF, back surgery.    PT Comments    Improved activity tolerance this afternoon. Pain rated 8/10. Pt continues to have some lightheadedness, dyspnea, and fatigue with ambulation--he was able to walk ~60 feet. Followed with recliner for safety. Will plan to continue gait and stair training on tomorrow. Feel pt would benefit from at least a week of HHPT before starting OPPT, if MD/PA are agreeable.     Recommendations for follow up therapy are one component of a multi-disciplinary discharge planning process, led by the attending physician.  Recommendations may be updated based on patient status, additional functional criteria and insurance authorization.  Follow Up Recommendations  Follow physician's recommendations for discharge plan and follow up therapies (PT recommend HHPT)     Assistance Recommended at Discharge Frequent or constant Supervision/Assistance  Patient can return home with the following A little help with walking and/or transfers;A little help with bathing/dressing/bathroom;Assistance with cooking/housework;Direct supervision/assist for medications management;Assist for transportation;Help with stairs or ramp for entrance   Equipment Recommendations  Rolling walker (2 wheels)    Recommendations for Other Services       Precautions / Restrictions Precautions Precautions: Fall Precaution Comments: No pillow under knee Restrictions Weight Bearing Restrictions: No RLE Weight Bearing: Weight bearing as tolerated     Mobility  Bed Mobility Overal bed mobility: Needs Assistance Bed Mobility: Supine to Sit, Sit to Supine     Supine to sit: Min guard, HOB elevated Sit to  supine: Min guard, HOB elevated   General bed mobility comments: Min guard A for mobility. Increased time.    Transfers Overall transfer level: Needs assistance Equipment used: Rolling walker (2 wheels) Transfers: Sit to/from Stand Sit to Stand: Min guard, From elevated surface   Step pivot transfers: Min guard       General transfer comment: Min guard A. Increased time. Cues provided.    Ambulation/Gait Ambulation/Gait assistance: Min guard Gait Distance (Feet): 60 Feet Assistive device: Rolling walker (2 wheels) Gait Pattern/deviations: Step-to pattern, Antalgic       General Gait Details: Min guard A for safety. Slow effortful gait. Pain rated 8/10. Pt reports mild lightheadedness. Dyspnea 2/4. Followed with recliner but did not need it.   Stairs             Wheelchair Mobility    Modified Rankin (Stroke Patients Only)       Balance Overall balance assessment: Needs assistance         Standing balance support: Reliant on assistive device for balance, During functional activity, Bilateral upper extremity supported Standing balance-Leahy Scale: Fair                              Cognition Arousal/Alertness: Awake/alert Behavior During Therapy: WFL for tasks assessed/performed Overall Cognitive Status: Within Functional Limits for tasks assessed                                          Exercises Total Joint Exercises Ankle Circles/Pumps: AROM, Both, 10 reps Quad Sets: AROM, Both, 10 reps  Heel Slides: AAROM, Right, 10 reps Hip ABduction/ADduction: AAROM, Right, 10 reps, AROM Straight Leg Raises: AAROM, AROM, Right, 10 reps Goniometric ROM: ~10-65 degrees    General Comments        Pertinent Vitals/Pain Pain Assessment Pain Assessment: 0-10 Pain Score: 8  Pain Location: R knee/thigh Pain Descriptors / Indicators: Aching, Discomfort, Tender, Operative site guarding Pain Intervention(s): Limited activity within  patient's tolerance, Monitored during session, Ice applied    Home Living                          Prior Function            PT Goals (current goals can now be found in the care plan section) Progress towards PT goals: Progressing toward goals    Frequency    7X/week      PT Plan Current plan remains appropriate    Co-evaluation              AM-PAC PT "6 Clicks" Mobility   Outcome Measure  Help needed turning from your back to your side while in a flat bed without using bedrails?: A Little Help needed moving from lying on your back to sitting on the side of a flat bed without using bedrails?: A Little Help needed moving to and from a bed to a chair (including a wheelchair)?: A Little Help needed standing up from a chair using your arms (e.g., wheelchair or bedside chair)?: A Little Help needed to walk in hospital room?: A Little Help needed climbing 3-5 steps with a railing? : A Lot 6 Click Score: 17    End of Session Equipment Utilized During Treatment: Gait belt Activity Tolerance: Patient tolerated treatment well;Patient limited by pain;Patient limited by fatigue Patient left: in bed;with call bell/phone within reach;with family/visitor present   PT Visit Diagnosis: Muscle weakness (generalized) (M62.81);Unsteadiness on feet (R26.81);Other abnormalities of gait and mobility (R26.89);Difficulty in walking, not elsewhere classified (R26.2);Dizziness and giddiness (R42)     Time: 1610-9604 PT Time Calculation (min) (ACUTE ONLY): 31 min  Charges:  $Gait Training: 23-37 mins $Therapeutic Exercise: 8-22 mins $Therapeutic Activity: 23-37 mins                        Faye Ramsay, PT Acute Rehabilitation  Office: [phone]

## 2021-12-21 NOTE — Progress Notes (Signed)
Subjective: 4 Days Post-Op Procedure(s) (LRB): TOTAL KNEE ARTHROPLASTY (Right) Patient reports pain as 5 on 0-10 scale.   Denies CP or SOB.  Voiding without difficulty. Positive flatus. Patient had an episode last night of confusion.  Patient was changed from Dilaudid to hydrocodone.  As he was felt to have intolerance to Dilaudid.  He is also taking tramadol. Objective: Vital signs in last 24 hours: Temp:  [97.9 F (36.6 C)-98.3 F (36.8 C)] 97.9 F (36.6 C) (12/17 0405) Pulse Rate:  [84-89] 88 (12/17 0405) Resp:  [17-18] 18 (12/17 0405) BP: (103-155)/(71-79) 103/79 (12/17 0405) SpO2:  [92 %-98 %] 98 % (12/17 0405)  Intake/Output from previous day: 12/16 0701 - 12/17 0700 In: 120 [P.O.:120] Out: 1125 [Urine:1125] Intake/Output this shift: No intake/output data recorded.  No results for input(s): "HGB" in the last 72 hours. No results for input(s): "WBC", "RBC", "HCT", "PLT" in the last 72 hours. No results for input(s): "NA", "K", "CL", "CO2", "BUN", "CREATININE", "GLUCOSE", "CALCIUM" in the last 72 hours. No results for input(s): "LABPT", "INR" in the last 72 hours.  Neurologically intact ABD soft Neurovascular intact Sensation intact distally Intact pulses distally Dorsiflexion/Plantar flexion intact Incision: dressing C/D/I No cellulitis present Moderate swelling.  Compartments are soft.  No evidence of DVT  Assessment/Plan:  4 Days Post-Op Procedure(s) (LRB): TOTAL KNEE ARTHROPLASTY (Right) Advance diet Up with therapy Plan for discharge tomorrow unless the patient does well today.  He was changed to hydrocodone.  He had an episode of confusion thought to be secondary to the Dilaudid.  Did not tolerate oxycodone. If he does well in physical therapy today and his pain is controlled he will be discharged otherwise patient currently does not feel comfortable being discharged to home.    Principal Problem:   Osteoarthritis of right knee      [name] 12/21/2021, @[facility] 

## 2021-12-22 MED ORDER — HYDROCODONE-ACETAMINOPHEN 5-325 MG PO TABS: 1.0000 | ORAL_TABLET | Freq: Four times a day (QID) | ORAL | 0 refills | Status: DC | PRN

## 2021-12-22 NOTE — Progress Notes (Signed)
Subjective: 5 Days Post-Op Procedure(s) (LRB): TOTAL KNEE ARTHROPLASTY (Right) Patient reports pain as 5 on 0-10 scale.   Patient does report that he was able to get some sleep last night Was able to get up with PT yesterday afternoon and ambulate Denies any CP, SOB, difficulty breathing Patient reports "this is the best I have felt since being here"  Objective: Vital signs in last 24 hours: Temp:  [97.5 F (36.4 C)-97.8 F (36.6 C)] 97.6 F (36.4 C) (12/18 0529) Pulse Rate:  [72-81] 79 (12/18 0529) Resp:  [16-18] 16 (12/18 0529) BP: (126-166)/(62-78) 155/73 (12/18 0529) SpO2:  [91 %-97 %] 96 % (12/18 0529)  Intake/Output from previous day: 12/17 0701 - 12/18 0700 In: 720 [P.O.:720] Out: 1250 [Urine:1250] Intake/Output this shift: No intake/output data recorded.  No results for input(s): "HGB" in the last 72 hours. No results for input(s): "WBC", "RBC", "HCT", "PLT" in the last 72 hours. No results for input(s): "NA", "K", "CL", "CO2", "BUN", "CREATININE", "GLUCOSE", "CALCIUM" in the last 72 hours. No results for input(s): "LABPT", "INR" in the last 72 hours.  Neurologically intact Neurovascular intact Sensation intact distally Intact pulses distally Dorsiflexion/Plantar flexion intact Incision: dressing C/D/I No cellulitis present Compartment soft   Assessment/Plan: 5 Days Post-Op Procedure(s) (LRB): TOTAL KNEE ARTHROPLASTY (Right) Up with therapy as long as patient does well with PT this am he is okay to be d/c home this afternoon Patient was supposed to have OPPT today we will get this rescheduled for later this week He will be okay with starting OPPT this week Meds have already been sent to pharmacy  Will follow up in 1 week in office for surture removal DVT ppx: resume home anticoag meds   Anticipated LOS equal to or greater than 2 midnights due to - Age 67 and older with one or more of the following:  - Obesity  - Expected need for hospital services (PT,  OT, Nursing) required for safe  discharge  - Anticipated need for postoperative skilled nursing care or inpatient rehab  - Active co-morbidities: Coronary Artery Disease OR   - Unanticipated findings during/Post Surgery: Slow post-op progression: GI, pain control, mobility  - Patient is a high risk of re-admission due to: None   Cherie Dark, PA-C  [facility] with Dr. Thomasena Edis [phone] 12/22/2021, 7:30 AM

## 2021-12-22 NOTE — Progress Notes (Signed)
Physical Therapy Treatment Patient Details Name: [name] MRN: [id number] DOB: [birth year] Today's Date: 12/22/2021   History of Present Illness Patient is 66 y.o. male s/p Rt TKA on 12/17/21 with PMH significant for anxiety, OA, CHF, CAD, GERD, HTN, LBBB, hx of MI a/p CABG and with AICD, hx of ACDF, back surgery.    PT Comments    POD # 5 General Comments: AxO x 3 feeling "better" but tired.  Eager to go home today. Assisted with amb in hallway and practiced stairs went well.  General Gait Details: pt tolerated a functional distance in hallway.  Had Spouse "hands on" asisst pt using a safety belt General stair comments: pt stated he has 2 steps NO RAILS to enter home and plans to have his Son asisst.  Demonstarted and instructed on forward approach with walker with 2nd asisst securing walker.  Spouse present during session. Pt plans to sleep downstairs/hospital bed ordered.  Pt has handout HEP. Addressed all mobility questions, discussed appropriate activity, educated on use of ICE.  Pt ready for D/C to home.   Recommendations for follow up therapy are one component of a multi-disciplinary discharge planning process, led by the attending physician.  Recommendations may be updated based on patient status, additional functional criteria and insurance authorization.  Follow Up Recommendations  Follow physician's recommendations for discharge plan and follow up therapies     Assistance Recommended at Discharge Frequent or constant Supervision/Assistance  Patient can return home with the following A little help with walking and/or transfers;A little help with bathing/dressing/bathroom;Assistance with cooking/housework;Direct supervision/assist for medications management;Assist for transportation;Help with stairs or ramp for entrance   Equipment Recommendations  Rolling walker (2 wheels)    Recommendations for Other Services       Precautions / Restrictions Precautions Precautions:  Fall Precaution Comments: No pillow under knee Restrictions Weight Bearing Restrictions: No RLE Weight Bearing: Weight bearing as tolerated     Mobility  Bed Mobility Overal bed mobility: Needs Assistance Bed Mobility: Supine to Sit     Supine to sit: Supervision     General bed mobility comments: self able with increased time plus using B UE's to self assist    Transfers Overall transfer level: Needs assistance Equipment used: Rolling walker (2 wheels) Transfers: Sit to/from Stand Sit to Stand: Supervision           General transfer comment: self able with increased time.  Also self able on/off toilet with Spouse near by.    Ambulation/Gait Ambulation/Gait assistance: Min guard Gait Distance (Feet): 55 Feet Assistive device: Rolling walker (2 wheels) Gait Pattern/deviations: Step-to pattern, Decreased stance time - right Gait velocity: decreased     General Gait Details: pt tolerated a functional distance in hallway.  Had Spouse "hands on" asisst pt using a safety belt   Stairs Stairs: Yes Stairs assistance: Min assist Stair Management: No rails, Step to pattern, Forwards, With walker Number of Stairs: 2 General stair comments: pt stated he has 2 steps NO RAILS to enter home and plans to have his Son asisst.  Demonstarted and instructed on forward approach with walker with 2nd asisst securing walker.  Spouse present during session.   Wheelchair Mobility    Modified Rankin (Stroke Patients Only)       Balance  Cognition Arousal/Alertness: Awake/alert Behavior During Therapy: WFL for tasks assessed/performed Overall Cognitive Status: Within Functional Limits for tasks assessed                                 General Comments: AxO x 3 feeling "better" but tired.  Eager to go home today.        Exercises      General Comments        Pertinent Vitals/Pain Pain  Assessment Pain Assessment: 0-10 Pain Score: 7  Pain Location: R knee/thigh Pain Descriptors / Indicators: Aching, Discomfort, Tender, Operative site guarding Pain Intervention(s): Monitored during session, Repositioned, Ice applied    Home Living                          Prior Function            PT Goals (current goals can now be found in the care plan section) Progress towards PT goals: Progressing toward goals    Frequency    7X/week      PT Plan Current plan remains appropriate    Co-evaluation              AM-PAC PT "6 Clicks" Mobility   Outcome Measure  Help needed turning from your back to your side while in a flat bed without using bedrails?: None Help needed moving from lying on your back to sitting on the side of a flat bed without using bedrails?: None Help needed moving to and from a bed to a chair (including a wheelchair)?: None Help needed standing up from a chair using your arms (e.g., wheelchair or bedside chair)?: None Help needed to walk in hospital room?: A Little Help needed climbing 3-5 steps with a railing? : A Little 6 Click Score: 22    End of Session Equipment Utilized During Treatment: Gait belt Activity Tolerance: Patient tolerated treatment well;Patient limited by pain;Patient limited by fatigue Patient left: in chair;with call bell/phone within reach (i) Nurse Communication: Mobility status;Other (comment) PT Visit Diagnosis: Muscle weakness (generalized) (M62.81);Unsteadiness on feet (R26.81);Other abnormalities of gait and mobility (R26.89);Difficulty in walking, not elsewhere classified (R26.2);Dizziness and giddiness (R42)     Time: 0321-2248 PT Time Calculation (min) (ACUTE ONLY): 28 min  Charges:  $Gait Training: 8-22 mins $Therapeutic Activity: 8-22 mins                     {Janyth Riera  PTA Acute  Rehabilitation Services Office M-F          [phone] Weekend pager [phone]

## 2021-12-22 NOTE — TOC Progression Note (Signed)
Transition of Care [facility]) - Progression Note   Patient Details  Name: [name] MRN: [id number] Date of Birth: 22-Oct-1955  Transition of [facility]) CM/SW Contact  Ewing Schlein, LCSW Phone Number: 12/22/2021, 11:15 AM  Clinical Narrative: CSW followed up with Noreene Larsson, [facility] for EO, and confirmed patient will go home with OPPT rather than HHPT. Patient confirmed he will go home with OPPT and reported he has not heard about the hospital bed delivery yet. CSW followed up with Belenda Cruise with [employer] and confirmed the bed should be delivered to the home today. Patient aware of delivery.  Expected Discharge Plan: OP Rehab Barriers to Discharge: No Barriers Identified  Expected Discharge Plan and Services Expected Discharge Plan: OP Rehab In-house Referral: Clinical Social Work Post Acute Care Choice: Durable Medical Equipment Living arrangements for the past 2 months: Single Family Home Expected Discharge Date: 12/22/21               DME Arranged: Dan Humphreys rolling DME Agency: [employer] Representative spoke with at DME Agency: Prearranged in orthopedist's office  Social Determinants of Health (SDOH) Interventions    Readmission Risk Interventions     No data to display

## 2021-12-22 NOTE — Progress Notes (Signed)
Reviewed written D/C instructions with pt and wife at bedside and all questions answered. Pt verbalized understanding. Pt D/C with all belongings in stable condition.

## 2021-12-23 ENCOUNTER — Telehealth: Payer: Self-pay

## 2021-12-23 NOTE — Patient Outreach (Signed)
  Care Coordination TOC Note Transition Care Management Unsuccessful Follow-up Telephone Call  Date of discharge and from where:  12/22/21[facility] [facility]  Attempts:  1st Attempt  Reason for unsuccessful TCM follow-up call:  No answer/busy     Antionette Fairy, RN,BSN,CCM [facility] Care Management Telephonic Care Management Coordinator Direct Phone: [phone] Toll Free: [phone] Fax: [phone]

## 2021-12-24 ENCOUNTER — Telehealth: Payer: Self-pay

## 2021-12-24 NOTE — Patient Outreach (Signed)
  Care Coordination TOC Note Transition Care Management Follow-up Telephone Call Date of discharge and from where: 12/22/21-[facility]  Dx: "OA of right knee s/p surgery" How have you been since you were released from the hospital? Incoming call from patient returning RN CM call. Patient states that he is experiencing a lot of pain and does not feel like pain med is working/helping. He is taking pain med(Vicodin) q6hrs as ordered with minimal to no relief. RN CM discussed pharmacological and non-pharmacological measures to aide in relief. Noted on d/c summary that patient has Vicodin,Oxycodone and Ultram all listed as new meds to start taking. Patient states that Oxycodone was initially prescribed when he was first thought to be going home. Then days later at day of actual discharge he was told pain med would be changed to Vicodin. Pharmacist told them that they were not allowed to get both and the most recent med order would be filled. Patient has already called surgeon office prior to calling RN CM to alert them of pain issues and need for pain med adjustments. He is awaiting a return call. Patient reports no BM since returning home. He is taking bowel meds as ordered. Encouraged patient to try adding Miralax to regimen as well as prunes or prune juice. Wife will go to store to get items today. Appetite has been fair. Any questions or concerns? Yes-as noted above-needs pain meds adjusted  Items Reviewed: Did the pt receive and understand the discharge instructions provided? Yes  Medications obtained and verified?  Discrepancy noted regarding pain meds -patient has already called surgeon office Other? Yes -post op care Any new allergieYes s since your discharge? No  Dietary orders reviewed? Yes Do you have support at home? Yes   Home Care and Equipment/Supplies: Were home health services ordered? not applicable If so, what is the name of the agency? N/A  Has the agency set up a time to  come to the patient's home? not applicable Were any new equipment or medical supplies ordered?  Yes: hospital bed What is the name of the medical supply agency? [employer] Were you able to get the supplies/equipment? yes Do you have any questions related to the use of the equipment or supplies? No  Functional Questionnaire: (I = Independent and D = Dependent) ADLs: A  Bathing/Dressing- A  Meal Prep- A  Eating- I  Maintaining continence- A  Transferring/Ambulation- A  Managing Meds- A  Follow up appointments reviewed:  PCP Hospital f/u appt confirmed? No   Specialist Hospital f/u appt confirmed? Yes  Scheduled to see Dr. Thomasena Edis on 01/02/22 @ 9:30am. Are transportation arrangements needed? No  If their condition worsens, is the pt aware to call PCP or go to the Emergency Dept.? Yes Was the patient provided with contact information for the PCP's office or ED? Yes Was to pt encouraged to call back with questions or concerns? Yes  SDOH assessments and interventions completed:   Yes SDOH Interventions Today    Flowsheet Row Most Recent Value  SDOH Interventions   Transportation Interventions Intervention Not Indicated       Care Coordination Interventions:  Education provided    Encounter Outcome:  Pt. Visit Completed    Antionette Fairy, RN,BSN,CCM [facility] Care Management Telephonic Care Management Coordinator Direct Phone: [phone] Toll Free: [phone] Fax: [phone]

## 2021-12-24 NOTE — Patient Outreach (Signed)
  Care Coordination TOC Note Transition Care Management Unsuccessful Follow-up Telephone Call  Date of discharge and from where:  12/22/21-[facility]  Attempts:  2nd Attempt  Reason for unsuccessful TCM follow-up call:  Unable to reach patient      Alessandra Grout [facility] Care Management Telephonic Care Management Coordinator Direct Phone: [phone] Toll Free: [phone] Fax: [phone]

## 2021-12-25 ENCOUNTER — Telehealth: Payer: Self-pay

## 2021-12-25 ENCOUNTER — Other Ambulatory Visit: Payer: Self-pay | Admitting: Family Medicine

## 2021-12-25 DIAGNOSIS — M1711 Unilateral primary osteoarthritis, right knee: Secondary | ICD-10-CM | POA: Diagnosis not present

## 2021-12-25 DIAGNOSIS — S32009K Unspecified fracture of unspecified lumbar vertebra, subsequent encounter for fracture with nonunion: Secondary | ICD-10-CM | POA: Diagnosis not present

## 2021-12-25 DIAGNOSIS — M25561 Pain in right knee: Secondary | ICD-10-CM | POA: Diagnosis not present

## 2021-12-25 DIAGNOSIS — M545 Low back pain, unspecified: Secondary | ICD-10-CM | POA: Diagnosis not present

## 2021-12-25 NOTE — Telephone Encounter (Signed)
Patient is requesting a refill of the following medications: Requested Prescriptions   Pending Prescriptions Disp Refills   zolpidem (AMBIEN) 10 MG tablet [Pharmacy Med Name: zolpidem 10 mg tablet] 30 tablet 1    Sig: TAKE 1 TABLET BY MOUTH AT BEDTIME    Date of last refill: 11/01/2021  Refill amount: 30/1  Treatment agreement date: No and it was not indicated when patient is to follow-up for routine visit, no pending appointment scheduled.

## 2021-12-25 NOTE — Patient Outreach (Signed)
  Care Coordination [facility] Note Transition Care Management Follow-up Telephone Call Care Coordination/Follow Up Call   Outreach call to follow up with patient from previous call. Patient sounded much better during this call. He voices that surgeon was able to change his pain med to Oxycodone which is working better. He went to therapy this morning and had issues with his BP dropping and being a little low. State it is back normal. Encouraged increased fluid intake. He denies any other needs or concerns at this time.   Care Coordination Interventions:  Education provided    Encounter Outcome:  Pt. Visit Completed    Antionette Fairy, RN,BSN,CCM [facility] Care Management Telephonic Care Management Coordinator Direct Phone: [phone] Toll Free: [phone] Fax: [phone]

## 2021-12-30 ENCOUNTER — Ambulatory Visit (HOSPITAL_COMMUNITY)
Admission: RE | Admit: 2021-12-30 | Discharge: 2021-12-30 | Disposition: A | Discharge status: Home / Self Care | Payer: PPO | Source: Ambulatory Visit | Attending: Cardiovascular Disease | Admitting: Cardiovascular Disease

## 2021-12-30 ENCOUNTER — Other Ambulatory Visit (HOSPITAL_COMMUNITY): Payer: Self-pay | Admitting: Specialist

## 2021-12-30 DIAGNOSIS — M25561 Pain in right knee: Secondary | ICD-10-CM | POA: Diagnosis not present

## 2021-12-30 DIAGNOSIS — M79604 Pain in right leg: Secondary | ICD-10-CM

## 2022-01-01 ENCOUNTER — Other Ambulatory Visit: Payer: Self-pay | Admitting: Family Medicine

## 2022-01-02 DIAGNOSIS — Z4789 Encounter for other orthopedic aftercare: Secondary | ICD-10-CM | POA: Diagnosis not present

## 2022-01-07 DIAGNOSIS — M7061 Trochanteric bursitis, right hip: Secondary | ICD-10-CM | POA: Diagnosis not present

## 2022-01-09 DIAGNOSIS — M25561 Pain in right knee: Secondary | ICD-10-CM | POA: Diagnosis not present

## 2022-01-09 DIAGNOSIS — M25661 Stiffness of right knee, not elsewhere classified: Secondary | ICD-10-CM | POA: Diagnosis not present

## 2022-01-12 ENCOUNTER — Telehealth: Payer: Self-pay | Admitting: Cardiology

## 2022-01-12 NOTE — Telephone Encounter (Signed)
Patient called in requesting to schedule an appt with Dr. Martinique. Advised patient his first available at this time is in May and offered a sooner appointment with a PA. Patient stated he is not willing to see a APP/NP and was not interested in scheduling that far out with Dr. Martinique. I then offered to schedule for first available with Martinique in May and add patient to the wait list incase of sooner becoming available. Patient accepted being put on the wait list, but not being scheduled for May. Patient then advised the appointment he is requesting is not for his 1 years f/u due in April, but for BP issues per [facility]. Patient then stated he does not need a doctor that doesn't have time to see him and did not give any further information regarding symptoms before ending call. Please advise.

## 2022-01-12 NOTE — Telephone Encounter (Signed)
Spoke with patient he recently had right knee replacement and due to BP issues has not been allowed to do PT. He was in on 12/30/21 for evaluation of possible DVT that was negative. When BP is checked it is 60/40 then later in the day it was 206/150. This has been the case different times of day when at therapy which is why they will not let him participate.  In addition, when he has his knee surgery he was kept in the hospital for 5 days to manage his BP before being released. When speaking with patient on the phone it was noticed he was SOB and he stated he was sitting.  Oxygen level have registered above 90 when checked.He has been taking his medication as prescribed and just stopped taking lasix yesterday 1/7, as he was not able to move fast enough to get to the bathroom when needed.  There has not been any inc ease in weight, in fact he stated he has lost weight.  He did not want to see a NP/APP as he felt he was not told the right thing when seen last time.  Patient scheduled with Dr. Martinique on Friday, 1/12 2 11:30 am.  Appreciative of appointment and call.

## 2022-01-13 ENCOUNTER — Telehealth: Payer: Self-pay | Admitting: Cardiology

## 2022-01-13 DIAGNOSIS — M25561 Pain in right knee: Secondary | ICD-10-CM | POA: Diagnosis not present

## 2022-01-13 DIAGNOSIS — M25661 Stiffness of right knee, not elsewhere classified: Secondary | ICD-10-CM | POA: Diagnosis not present

## 2022-01-13 NOTE — Telephone Encounter (Signed)
Patient stated he was returning a call from triage.  States he was called about 15 mins ago, but they couldn't hear him.  I did not see anything documented.

## 2022-01-13 NOTE — Telephone Encounter (Signed)
Spoke with patient who stated he received a call from triage at 2:08 today. From checking with other staff and looking in patient's chart, the call patient received was accidental. He stated no one was on the line when he picked up. He has VO with Dr. Martinique on 1/12.

## 2022-01-14 ENCOUNTER — Ambulatory Visit (INDEPENDENT_AMBULATORY_CARE_PROVIDER_SITE_OTHER): Payer: PPO

## 2022-01-14 DIAGNOSIS — Z95810 Presence of automatic (implantable) cardiac defibrillator: Secondary | ICD-10-CM

## 2022-01-14 DIAGNOSIS — I447 Left bundle-branch block, unspecified: Secondary | ICD-10-CM

## 2022-01-14 LAB — CUP PACEART REMOTE DEVICE CHECK
Battery Remaining Longevity: 11 mo
Battery Voltage: 2.89 V
Brady Statistic AP VP Percent: 0.14 %
Brady Statistic AP VS Percent: 0.04 %
Brady Statistic AS VP Percent: 96.16 %
Brady Statistic AS VS Percent: 3.66 %
Brady Statistic RA Percent Paced: 0.18 %
Brady Statistic RV Percent Paced: 94.45 %
Date Time Interrogation Session: 20240110052826
HighPow Impedance: 84 Ohm
Implantable Lead Connection Status: 753985
Implantable Lead Connection Status: 753985
Implantable Lead Connection Status: 753985
Implantable Lead Implant Date: 20190610
Implantable Lead Implant Date: 20200306
Implantable Lead Implant Date: 20200306
Implantable Lead Location: 753858
Implantable Lead Location: 753859
Implantable Lead Location: 753860
Implantable Lead Model: 5071
Implantable Lead Model: 5076
Implantable Pulse Generator Implant Date: 20200306
Lead Channel Impedance Value: 323 Ohm
Lead Channel Impedance Value: 399 Ohm
Lead Channel Impedance Value: 4047 Ohm
Lead Channel Impedance Value: 4047 Ohm
Lead Channel Impedance Value: 437 Ohm
Lead Channel Impedance Value: 456 Ohm
Lead Channel Pacing Threshold Amplitude: 0.5 V
Lead Channel Pacing Threshold Amplitude: 0.625 V
Lead Channel Pacing Threshold Amplitude: 1.75 V
Lead Channel Pacing Threshold Pulse Width: 0.4 ms
Lead Channel Pacing Threshold Pulse Width: 0.4 ms
Lead Channel Pacing Threshold Pulse Width: 1 ms
Lead Channel Sensing Intrinsic Amplitude: 3.625 mV
Lead Channel Sensing Intrinsic Amplitude: 3.625 mV
Lead Channel Sensing Intrinsic Amplitude: 31.625 mV
Lead Channel Sensing Intrinsic Amplitude: 31.625 mV
Lead Channel Setting Pacing Amplitude: 1.5 V
Lead Channel Setting Pacing Amplitude: 2.5 V
Lead Channel Setting Pacing Amplitude: 3 V
Lead Channel Setting Pacing Pulse Width: 0.4 ms
Lead Channel Setting Pacing Pulse Width: 1 ms
Lead Channel Setting Sensing Sensitivity: 0.6 mV
Zone Setting Status: 755011
Zone Setting Status: 755011

## 2022-01-14 NOTE — Progress Notes (Signed)
Cardiology Office Note:    Date:  10/08/2021   ID:  [name], DOB 1955-12-03, MRN [id number]  PCP:  Wardell Honour, MD  Cardiologist:  Brett Soza Martin[place], MD  Electrophysiologist:  Virl Axe, MD   Referring MD: Wardell Honour, MD   Chief Complaint  Patient presents with   Coronary Artery Disease   Congestive Heart Failure     History of Present Illness:    [name] is a 67 y.o. male with a hx of CAD s/p CABG, hypertension, left bundle branch block, hyperlipidemia, and chronic systolic heart failure.  Patient was admitted in June 2019 with flash pulmonary edema.  He was ruled in for NSTEMI and underwent cardiac catheterization which showed severe three-vessel CAD. He underwent CABG by Dr Prescott Gum with LIMA to LAD, SVG to diagonal, SVG to ramus intermediate, and SVG to RCA.  He had a syncope in June 2019 while waiting in the neurology office due to urethral stricture.  He was noted to have wide-complex tachycardia on telemetry with heart rate of 170.  EF was 35 to 40%.  Lower extremity Doppler was negative for DVT.  CT of the chest was negative for PE.  He was seen by EP, the underlying rhythm could be either VT versus NSVT.  He was fitted with LifeVest.  Medication was cut back due to mild hypotension and orthostasis.  Repeat echocardiogram in September 2019 showed persistently low EF of 25 to 30%.  He was unable to tolerate Entresto, spironolactone and carvedilol due to orthostatic symptom and the lightheadedness.  Patient eventually underwent BIV ICD implantation in March 2020 by Dr. Caryl Comes after failing another trial of medical therapy.  Since then, repeat echocardiogram in July 2020 did show significant improvement in EF to 40 to 45%.  Was seen by EP on 08/14/20.  Noted persistent chest pain. Also notes increased dyspnea. Mild edema. Was given lasix which he has taken a couple of times. Myoview ordered. This showed EF drop to 17% with large inferolateral scar and anteroapical  ischemia. Repeat Echo is showed EF 40%.  Cardiac cath was done. This showed all grafts patent except for SVG to ramus which was occluded.  Right heart pressures, LV filling pressures and CO were normal. Recommended addition of SGLT 2 inhibitor. He did have lumbar surgery in September 2022.   He was  seen in the office on 06/20/2021 ordered a myriad of concerns including fatigue, lightheadedness, intermittent low heart rate, dyspnea on exertion, and multiple episodes of presyncope. Additionally, he reported bilateral lower extremity edema, right lower leg pain, and intermittent cramping in his calves with activity.  He was scheduled for outpatient cardiac catheterization for further evaluation. He, however, presented to the ED on 06/23/2021 with complaints of shortness of breath.  He was hospitalized from 6/19-6/21/2023 in the setting of acute heart failure exacerbation/CAD with anginal equivalent.  Troponin was mildly elevated. Chest x-ray showed mild CHF, small left pleural effusion. He received IV Lasix.  Lower extremity duplex was negative for DVT.  CTA of the chest was negative for PE.  He underwent cardiac catheterization on 06/24/2021 which revealed stable three-vessel CAD, known occluded SVG-RI, otherwise patent grafts, progression of proximal ramus intermedius lesion  to 80% with positive FFR s/p DES, RHC with relatively normal pressures.    Has arthritis in right knee. Did end up having right TKR in December. He is having a lot of pain with this. Also has noted a lot of fluctuation in his  BP. His first PT session was stopped due to low BP. LE venous doppler negative for DVT. CP ranges anywhere from 99 to 242 systolic.   He is still eating  healthy now and follows low sodium diet. He states he held his lasix the last couple of days due to frequent nocturia and it is difficult for him to get up from his surgery. He has chronic SOB. No increase edema and weight is down 5 lbs since October. No chest pain. On  device check 2 days ago Optivol/thoracic impedence was OK.    Past Medical History:  Diagnosis Date   AICD (automatic cardioverter/defibrillator) present    Anxiety    Arthritis    "mild; back, neck, spine" (06/09/2017)   CHF (congestive heart failure) (HCC)    Chronic back pain    Coronary artery disease    Dyspnea    GERD (gastroesophageal reflux disease)    History of gout    History of kidney stones X 2   Hypercholesterolemia    Hypertension    LBBB (left bundle branch block)    MI (myocardial infarction) ([place]) 1993   LATERAL   MI (myocardial infarction) ([place]) ?09/2001; 06/07/2017   Pneumonia    "several times" (06/09/2017)   S/P coronary artery stent placement    RCA   Varicose veins     Past Surgical History:  Procedure Laterality Date   ANTERIOR CERVICAL DECOMP/DISCECTOMY FUSION  03/03/2011   Procedure: ANTERIOR CERVICAL DECOMPRESSION/DISCECTOMY FUSION 3 LEVELS;  Surgeon: Floyce Stakes, MD;  Location: [facility];  Service: Neurosurgery;  Laterality: N/A;  Cervical three-four Cervical four-five Cervical five-six Anterior cervical decompression/diskectomy, fusion   APPLICATION OF ROBOTIC ASSISTANCE FOR SPINAL PROCEDURE N/A 10/03/2020   Procedure: APPLICATION OF ROBOTIC ASSISTANCE FOR SPINAL PROCEDURE;  Surgeon: Consuella Lose, MD;  Location: [facility];  Service: Neurosurgery;  Laterality: N/A;   BACK SURGERY     CARDIAC CATHETERIZATION  2009   Stents in RCA patent. 70 to 80% PL, and 50% ostial PD.    CARDIAC CATHETERIZATION N/A 11/20/2014   Procedure: Left Heart Cath and Coronary Angiography;  Surgeon: Kaiel Weide M Martinique, MD;  Location: [facility];  Service: Cardiovascular;  Laterality: N/A;   CORONARY ANGIOPLASTY     DIRECT ANGIOPLASTY THE MARGINAL BRANCH   CORONARY ANGIOPLASTY WITH STENT PLACEMENT     RCA   CORONARY ARTERY BYPASS GRAFT N/A 06/14/2017   Procedure: CORONARY ARTERY BYPASS GRAFTING (CABG) x four, using left internal mammary artery and right leg  greater saphenous vein harvested endoscopically;  Surgeon: Ivin Poot, MD;  Location: [facility];  Service: Open Heart Surgery;  Laterality: N/A;   CORONARY STENT INTERVENTION N/A 06/24/2021   Procedure: CORONARY STENT INTERVENTION;  Surgeon: Leonie Man, MD;  Location: [facility];  Service: Cardiovascular;  Laterality: N/A;   CYSTOSCOPY N/A 06/14/2017   Procedure: CYSTOSCOPY;  Surgeon: Ivin Poot, MD;  Location: [facility];  Service: Open Heart Surgery;  Laterality: N/A;   ICD IMPLANT N/A 03/11/2018   Procedure: ICD IMPLANT - Dual Chamber;  Surgeon: Deboraha Sprang, MD;  Location: [facility];  Service: Cardiovascular;  Laterality: N/A;   INSERTION OF SUPRAPUBIC CATHETER N/A 06/14/2017   Procedure: INSERTION OF SUPRAPUBIC CATHETER - Lower abdomen;  Surgeon: Ivin Poot, MD;  Location: [facility];  Service: Open Heart Surgery;  Laterality: N/A;   INTRAVASCULAR PRESSURE WIRE/FFR STUDY N/A 06/24/2021   Procedure: INTRAVASCULAR PRESSURE WIRE/FFR STUDY;  Surgeon: Ellyn Hack,  Leonie Green, MD;  Location: [facility];  Service: Cardiovascular;  Laterality: N/A;   POSTERIOR LUMBAR FUSION  10/2011   RIGHT/LEFT HEART CATH AND CORONARY ANGIOGRAPHY N/A 06/08/2017   Procedure: RIGHT/LEFT HEART CATH AND CORONARY ANGIOGRAPHY;  Surgeon: Martinique, Aljean Horiuchi M, MD;  Location: [facility];  Service: Cardiovascular;  Laterality: N/A;   RIGHT/LEFT HEART CATH AND CORONARY/GRAFT ANGIOGRAPHY N/A 09/06/2020   Procedure: RIGHT/LEFT HEART CATH AND CORONARY/GRAFT ANGIOGRAPHY;  Surgeon: Martinique, Guneet Delpino M, MD;  Location: [facility];  Service: Cardiovascular;  Laterality: N/A;   RIGHT/LEFT HEART CATH AND CORONARY/GRAFT ANGIOGRAPHY N/A 06/24/2021   Procedure: RIGHT/LEFT HEART CATH AND CORONARY/GRAFT ANGIOGRAPHY;  Surgeon: Leonie Man, MD;  Location: [facility];  Service: Cardiovascular;  Laterality: N/A;   TEE WITHOUT CARDIOVERSION N/A 06/14/2017   Procedure: TRANSESOPHAGEAL ECHOCARDIOGRAM (TEE);   Surgeon: Prescott Gum, Collier Salina, MD;  Location: [facility];  Service: Open Heart Surgery;  Laterality: N/A;   URETHROPLASTY N/A 10/15/2017   Procedure: URETHROPLASTY WITH BUCCAL GRAFT HARVAST;  Surgeon: Ardis Hughs, MD;  Location: [facility];  Service: Urology;  Laterality: N/A;    Current Medications: Current Meds  Medication Sig   acetaminophen (TYLENOL) 500 MG tablet Take 1,000 mg by mouth every 4 (four) hours as needed for mild pain. Morning & noon   aspirin 81 MG EC tablet Take 81 mg by mouth in the morning.   carvedilol (COREG) 6.25 MG tablet Take 0.5 tablets (3.125 mg total) by mouth 2 (two) times daily with a meal. Must have office visit for additional refills [phone]   clopidogrel (PLAVIX) 75 MG tablet Take 1 tablet (75 mg total) by mouth daily with breakfast.   empagliflozin (JARDIANCE) 10 MG TABS tablet Take 10 mg by mouth daily.   furosemide (LASIX) 40 MG tablet Take 1 tablet (40 mg total) by mouth daily.   nitroGLYCERIN (NITROSTAT) 0.4 MG SL tablet Place 1 tablet (0.4 mg total) under the tongue every 5 (five) minutes x 3 doses as needed for chest pain.   pantoprazole (PROTONIX) 40 MG tablet Take 1 tablet (40 mg total) by mouth daily.   REPATHA SURECLICK 875 MG/ML SOAJ INJECT INTO SKIN EVERY 14 DAYS (Patient taking differently: Inject 140 mg into the muscle every 14 (fourteen) days.)   valsartan (DIOVAN) 80 MG tablet Take 1 tablet (80 mg total) by mouth daily.   zolpidem (AMBIEN) 10 MG tablet TAKE 1 TABLET BY MOUTH AT BEDTIME     Allergies:   Ezetimibe, Niacin, and Statins   Social History   Socioeconomic History   Marital status: Married    Spouse name: Not on file   Number of children: 1   Years of education: Not on file   Highest education level: Not on file  Occupational History   Occupation: Truck Press photographer  Tobacco Use   Smoking status: Former    Packs/day: 2.00    Years: 20.00    Total pack years: 40.00    Types: Cigarettes    Quit date: 05/07/1999    Years since  quitting: 22.4   Smokeless tobacco: Never  Vaping Use   Vaping Use: Never used  Substance and Sexual Activity   Alcohol use: Not Currently    Comment: 2-3 per month (occasionally)   Drug use: Never   Sexual activity: Not on file  Other Topics Concern   Not on file  Social History Narrative   Diet      Do you drink/eat things with caffeine  No      Marital Status Married What year were you married?  1987      Do you live in a house, apartment, assisted living, condo, trailer, etc.?  House      Is it one or more stories?  2 stories      How many persons live in your home?  2         Do you have any pets in your home?(please list)  No      Highest level of education completed:  11 Years      Current or past profession:  Sales-past      Do you exercise?:  YES    Type and how often:  Cardio and weights/At least everyday      Do you have a Living Will? (Form that indicates scenarios where you would not want your life prolonged)  YES      Do you have a DNR form?  YES       If not, would you like to discuss one?      Do you have signed POA/HPOA forms?  NO      Do you have difficulty bathing or dressing yourself?  NO      Do you have difficulty preparing food or eating?  NO      Do you have difficulty managing medications?  NO      Do you have difficulty managing your finances?  NO      Do you have difficulty affording your medications?  NO                     Social Determinants of Health   Financial Resource Strain: Low Risk  (01/09/2021)   Overall Financial Resource Strain (CARDIA)    Difficulty of Paying Living Expenses: Not very hard  Food Insecurity: Food Insecurity Present (09/01/2021)   Hunger Vital Sign    Worried About Running Out of Food in the Last Year: Never true    Ran Out of Food in the Last Year: Sometimes true  Transportation Needs: No Transportation Needs (09/01/2021)   PRAPARE - Hydrologist (Medical): No    Lack of  Transportation (Non-Medical): No  Physical Activity: Not on file  Stress: Not on file  Social Connections: Not on file     Family History: The patient's family history includes Breast cancer in his mother; Coronary artery disease in his mother; Diabetes in his father; Heart attack in his brother, father, and maternal grandfather; Heart disease in his brother; Stroke in his father.  ROS:   Please see the history of present illness.     All other systems reviewed and are negative.  EKGs/Labs/Other Studies Reviewed:    The following studies were reviewed today:  Echo 07/11/2018 1. The left ventricle has mild-moderately reduced systolic function, with an ejection fraction of 40-45%. The cavity size was normal. Left ventricular diastolic Doppler parameters are indeterminate. Indeterminate filling pressures.  2. The right ventricle has normal systolic function. The cavity was normal. There is no increase in right ventricular wall thickness. Right ventricular systolic pressure could not be assessed.  3. Left atrial size was mildly dilated.  4. Aortic valve regurgitation is mild to moderate by color flow Doppler. No stenosis of the aortic valve.  5. The Ascending aorta measures 39 mm, within normal limits when indexed to body surface area.  6. When compared to the prior study: 01/21/2018- LV ejection fraction  has improved. Ventricular septal contraction is less dysynchronous. Aortic valve regurgitation has not significantly changed. Side by side comparison of images performed.  Myoview 08/21/20: Study Highlights    Nuclear stress EF: 17%. The left ventricular ejection fraction is severely decreased (<30%). There was no ST segment deviation noted during stress. Defect 1: There is a large defect of severe severity present in the basal inferior, basal inferolateral, mid inferior, mid inferolateral and apical inferior location. Defect 2: There is a small defect of mild severity present in the apical  anterior location. This is a high risk study. Findings consistent with ischemia and prior myocardial infarction.   High risk study due to severely decreased left ventricular function. There is a large (old) inferolateral scar. There is a small area of mild anteroapical ischemia. There has been a marked reduction in left ventricular systolic function since 2018.  Recommend correlation with echo and consider possible "balanced ischemia". Compared to ECG from 02/05/2020, there is a marked reduction in the amplitude of the initial R wave in V1, although the initial deflection is still positive. Visually, there appears to be substantial septal-lateral dyssynchrony. Consider reevaluation of CRT device settings.    Echo 08/30/20: IMPRESSIONS     1. Poor acoustic windows limit study. Definity used. LVEF is moderately  depressed with diffuse hypokinesis worse in the annteiror and lateral  walls. LVEF is approximately 40%. The left ventricle has moderately  decreased function. The left ventricular  internal cavity size was severely dilated. There is mild left ventricular  hypertrophy. Indeterminate diastolic filling due to E-A fusion.   2. Right ventricular systolic function is normal. The right ventricular  size is normal.   3. Mild mitral valve regurgitation.   4. Aortic valve regurgitation is mild. Mild aortic valve sclerosis is  present, with no evidence of aortic valve stenosis.   5. Aortic dilatation noted. There is mild dilatation of the aortic root,  measuring 42 mm.   Comparison(s): The left ventricular function is unchanged.   Cardiac cath 09/06/20:  RIGHT/LEFT HEART CATH AND CORONARY/GRAFT ANGIOGRAPHY   Conclusion      Ost LM to Mid LM lesion is 40% stenosed.   Prox LAD to Mid LAD lesion is 100% stenosed.   Ramus lesion is 60% stenosed.   Prox RCA lesion is 90% stenosed.   Origin lesion is 100% stenosed.   SVG graft was visualized by angiography and is normal in caliber.   SVG  graft was visualized by angiography.   SVG graft was visualized by angiography and is normal in caliber.   LIMA graft was visualized by angiography and is normal in caliber.   The graft exhibits no disease.   The graft exhibits no disease.   The graft exhibits no disease.   LV end diastolic pressure is normal.   There is no aortic valve stenosis.   3 vessel CAD.  Patent LIMA to the LAD.  Patent SVG to the first diagonal Patent SVG to the PDA Occluded SVG to the Ramus intermediate Normal LV filling pressures Normal right heart pressures Normal cardiac output   Plan: recommend continued medical management. The lesion in the ramus intermediate is moderate - in some views appears <40% and other views about 60-65%. This is certainly not critical. Good flow down the native vessel is probably the reason for graft closure. His EF by Echo is in line with prior Echo in 2020. Based on current findings I cannot explain his dyspnea. Would consider outpatient sleep study and/or  PFTs to consider other causes. We will add Jardiance for his CHF and DM. He does not need diuresis.    Echo 06/16/21: IMPRESSIONS     1. Left ventricular ejection fraction, by estimation, is 30 to 35%. Left  ventricular ejection fraction by 3D volume is 31 %. The left ventricle has  moderately decreased function. The left ventricle demonstrates global  hypokinesis. The left ventricular  internal cavity size was moderately dilated. Left ventricular diastolic  parameters are consistent with Grade I diastolic dysfunction (impaired  relaxation).   2. Right ventricular systolic function is mildly reduced. The right  ventricular size is normal. There is normal pulmonary artery systolic  pressure. The estimated right ventricular systolic pressure is [id number] mmHg.   3. Left atrial size was mild to moderately dilated.   4. The mitral valve is grossly normal. Mild mitral valve regurgitation.  No evidence of mitral stenosis.   5. The  aortic valve is tricuspid. Aortic valve regurgitation is mild. No  aortic stenosis is present.   6. There is mild dilatation of the ascending aorta, measuring 41 mm.   7. The inferior vena cava is normal in size with greater than 50%  respiratory variability, suggesting right atrial pressure of 3 mmHg.   Comparison(s): Changes from prior study are noted. The left ventricular  function is worsened.    Cardiac cath 06/24/21: Procedures  CORONARY STENT INTERVENTION  INTRAVASCULAR PRESSURE WIRE/FFR STUDY  RIGHT/LEFT HEART CATH AND CORONARY/GRAFT ANGIOGRAPHY   Conclusion      -------------- NATIVE CORONARIES------------   Prox RCA lesion is 90% stenosed.  Dist RCA lesion is 90% stenosed.   Ost LM to Mid LM lesion is 40% stenosed.   Prox LAD lesion is 100% stenosed prior to D1. Prox LAD to Mid LAD lesion is 100% stenosed following D1.   CULPRIT LESION: Ramus lesion is 75% stenosed.  (Borderline positive by RFR (0.91) and FFR 0.80) -> given the patient's symptoms, this is felt to be significant.   A drug-eluting stent was successfully placed using a SYNERGY XD 3.0X16 -> deployed to 3.3 mm.   Post intervention, there is a 0% residual stenosis.   -------------- GRAFTS -------------------------------   LIMA-LAD graft was visualized by angiography and is normal in caliber. The graft exhibits no disease. There is no competitive flow   SVG-D1 graft was visualized by angiography and is normal in caliber. The graft exhibits no disease, and the flow is not reversed.  There is retrograde filling of a major septal perforator trunk in the LAD that it would otherwise not be perfused by the native LAD or retrograde filling from the LIMA..   SVG-rPDA graft was visualized by angiography and is moderate in size.  The graft exhibits no disease. There is trivial competitive flow   SVG-RI graft was visualized by angiography, Origin lesion is 100% stenosed.   -------------- HEMODYNAMICS-----------------   LV end  diastolic pressure is normal.   There is no aortic valve stenosis.   POST-OPERATIVE DIAGNOSIS:   Stable Three-Vessel CAD: Ostial mid OM 40%.  Proximal to mid LAD 100%.  Proximal RCA 90% at bend with trace filling of the PDA and PL.  Patent LCx that is small and nondominant.   Culprit lesion: Progression of proximal Ramus Intermedius (RI) lesion from 60% to 70-80% -> RFR 0.90, 0.91 with a FFR of 0.80.  Based on his symptoms thought to be significant. Successful DES PCI of ostial and proximal L RI using Synergy DES 3.0 mm x 16  mm deployed to 3.3 mm. Reducing lesion from 70-80% to 0% TIMI-3 flow pre and post.   Known occluded SVG-RI with otherwise patent SVG-PDA, SVG-D1, and LIMA-LAD RHC pressures relatively normal:  RAP 2 mmHg, RVP-EDP 28/0-0 mmHg; PAP-mean 19/10-15 mmHg, PCWP 16 mmHg; LVP-EDP 120/1-12 mmHg,  AoP-MAP 116/54 mmHg - 72 mmHg; Ao sat 98%, PA sat 69% Cardiac Output-Index: Fick 5.32-2.33; TD 3.21-1.4 (suggestive of valvular disease.   PLAN OF CARE:  Return to nursing floor for post PCI care after sheath removal. ->  Dual and platelet therapy for 6 months, then okay to stop aspirin.  Seems relatively euvolemic on right heart cath.     Glenetta Hew, MD  Right Heart  Right Heart Pressures PAP-mean 19/10-15 mmHg,  PCWP 16 mmHg;  LV EDP is normal. Minimally elevated: LVP-EDP 120/1-12 mmHg,  RHC pressures relatively normal:   AoP-MAP 116/54 mmHg - 72 mmHg;   Ao sat 98%, PA sat 69%  Cardiac Output-Index: Fick 5.32-2.33; TD 3.21-1.4 (suggestive of valvular disease.  Right Atrium Right atrial pressure is normal. RAP 2 mmHg,  Right Ventricle RVP-EDP 28/0-0 mmHg;  Coronary Diagrams  Diagnostic Dominance: Right  Intervention    EKG:  EKG is not ordered today.   Recent Labs: 06/23/2021: ALT 18; B Natriuretic Peptide 338.6 06/25/2021: BUN 14; Creatinine, Ser 1.00; Hemoglobin 14.3; Platelets 199; Potassium 4.8; Sodium 139  Recent Lipid Panel    Component Value Date/Time    CHOL 153 08/30/2020 0828   TRIG 168 (H) 08/30/2020 0828   HDL 54 08/30/2020 0828   CHOLHDL 2.8 08/30/2020 0828   CHOLHDL 2.6 10/29/2017 0939   VLDL 27 10/29/2017 0939   LDLCALC 71 08/30/2020 0828    Physical Exam:    VS:  BP 110/62   Pulse 87   Ht 6\' 2"  (1.88 m)   Wt 230 lb 0.3 oz (104.3 kg)   SpO2 95%   BMI 29.53 kg/m     Wt Readings from Last 3 Encounters:  10/08/21 230 lb 0.3 oz (104.3 kg)  07/04/21 224 lb 3.2 oz (101.7 kg)  06/25/21 221 lb 3.2 oz (100.3 kg)     GEN:  Well nourished, well developed in no acute distress HEENT: Normal NECK: No JVD; No carotid bruits LYMPHATICS: No lymphadenopathy CARDIAC: RRR, no murmurs, rubs, gallops RESPIRATORY:  Clear to auscultation without rales, wheezing or rhonchi  ABDOMEN: Soft, non-tender, non-distended MUSCULOSKELETAL: no  edema; No deformity  SKIN: Warm and dry NEUROLOGIC:  Alert and oriented x 3 PSYCHIATRIC:  Normal affect   ASSESSMENT:    1. Chronic systolic heart failure ([place])   2. Coronary artery disease of native artery of native heart with stable angina pectoris ([place])   3. LBBB (left bundle branch block)   4. Hyperlipidemia LDL goal <70   5. Essential hypertension       PLAN:    In order of problems listed above:  CAD s/p CABG 2019: Abnormal Myoview with large inferolateral scar and anteroapical ischemia last fall.  Cardiac cath showed patent grafts except SVG to ramus which was occluded. More recent admission in June 2023 with stenting of native ramus intermediate. Now on ASA only  Hyperlipidemia: On Repatha. Statin intolerant.   Acute on Chronic combined systolic/diastolic  heart failure: Last EF by Echo was 40% and is s/p BiV ICD. On Coreg and valsartan. Entresto resulted in low BPs. Unable to afford Jardiance - will check on patient assistance.  Right heart cath normal right heart and LV filling pressures. Normal cardiac  output. Plan to resume lasix if weight goes up or any increase swelling.   Status  post biventricular ICD: Followed by EP.   recent device interrogation was good.   GERD  6.   Lumbar disc disease. S/p surgery  7.  S/p right TKR   Medication Adjustments/Labs and Tests Ordered: Current medicines are reviewed at length with the patient today.  Concerns regarding medicines are outlined above.  No orders of the defined types were placed in this encounter.   No orders of the defined types were placed in this encounter.    There are no Patient Instructions on file for this visit. Follow up in 4 months with fasting labs.   Signed, Amelianna Meller Martinique, MD  10/08/2021 9:17 AM     [facility]

## 2022-01-16 ENCOUNTER — Ambulatory Visit: Payer: PPO | Attending: Cardiology | Admitting: Cardiology

## 2022-01-16 ENCOUNTER — Other Ambulatory Visit: Payer: Self-pay

## 2022-01-16 ENCOUNTER — Encounter: Payer: Self-pay | Admitting: Cardiology

## 2022-01-16 VITALS — BP 115/70 | HR 89 | Ht 74.0 in | Wt 225.4 lb

## 2022-01-16 DIAGNOSIS — Z951 Presence of aortocoronary bypass graft: Secondary | ICD-10-CM

## 2022-01-16 DIAGNOSIS — I447 Left bundle-branch block, unspecified: Secondary | ICD-10-CM

## 2022-01-16 DIAGNOSIS — I5022 Chronic systolic (congestive) heart failure: Secondary | ICD-10-CM

## 2022-01-16 DIAGNOSIS — I25118 Atherosclerotic heart disease of native coronary artery with other forms of angina pectoris: Secondary | ICD-10-CM

## 2022-01-16 DIAGNOSIS — E785 Hyperlipidemia, unspecified: Secondary | ICD-10-CM | POA: Diagnosis not present

## 2022-01-16 MED ORDER — EMPAGLIFLOZIN 10 MG PO TABS: 10.0000 mg | ORAL_TABLET | Freq: Every day | ORAL | 3 refills | Status: DC

## 2022-01-16 NOTE — Addendum Note (Signed)
Addended by: Kathyrn Lass on: 01/16/2022 02:19 PM   Modules accepted: Orders

## 2022-01-21 ENCOUNTER — Other Ambulatory Visit: Payer: Self-pay | Admitting: Family Medicine

## 2022-01-21 NOTE — Telephone Encounter (Signed)
Patient has request refill on medication Ambien. Patient last refill dated 01/02/2022. Patient has No contract on file. Patient medication pend and sent to PCP Sabra Heck Lillette Boxer, MD for approval.

## 2022-01-22 ENCOUNTER — Telehealth: Payer: Self-pay | Admitting: Cardiology

## 2022-01-22 MED ORDER — VALSARTAN 40 MG PO TABS: 40.0000 mg | ORAL_TABLET | Freq: Every day | ORAL | 3 refills | Status: DC

## 2022-01-22 NOTE — Telephone Encounter (Signed)
Patient informed to hold valsartan and coreg today and to resume coreg tomorrow and decrease valsartan to 40mg  daily. Patient stated he already took his medications today. He will follow the directions above starting tomorrow. He asked for a new prescription be sent for valsartan 40mg  because are too small to cut. Done. He will continue to check BP.

## 2022-01-22 NOTE — Addendum Note (Signed)
Addended by: Kathyrn Lass on: 01/22/2022 03:00 PM   Modules accepted: Orders

## 2022-01-22 NOTE — Telephone Encounter (Signed)
Patient stated that after breakfast and shower this AM, and while he was shaving, he had presyncope. BP 77/42 and 15 minutes later, 68/40. He got into bed and an hour later, BO 127/84. Stated he was dizzy the entire time of hypotension. He is staying hydrated 6 (16 oz) bottle each day. Last BP today 132/84. Please advise.

## 2022-01-22 NOTE — Telephone Encounter (Signed)
Patient called to leave message for [facility]:  Pt c/o BP issue: STAT if pt c/o blurred vision, one-sided weakness or slurred speech  1. What are your last 5 BP readings?  From 10am to noon:  70/42    80/48    68/40    127/84  2. Are you having any other symptoms (ex. Dizziness, headache, blurred vision, passed out)? Patient states he was dizzy.    3. What is your BP issue? BP keeps going up and down, was seen in the office for this on Monday.  States it took about an hour to finally get a normal BP reading.  He states he wasn't having any pain this morning, states he had knee surgery about 4-5 weeks ago.

## 2022-01-22 NOTE — Telephone Encounter (Signed)
Spoke to patient Dr.Jordan's advice given.He already took medications today.He will hold coreg and valsartan tomorrow 1/19.He will resume coreg on 1/20.He will decrease valsartan to 40 mg daily on 1/20.Stated he has been taking hot showers.He will stop taking hot showers.He will monitor B/P daily for 1 week and call back to report readings.Advised to call sooner if needed.

## 2022-01-22 NOTE — Telephone Encounter (Signed)
LMTCB.

## 2022-01-22 NOTE — Addendum Note (Signed)
Addended by: Betha Loa F on: 01/22/2022 02:56 PM   Modules accepted: Orders

## 2022-01-23 ENCOUNTER — Telehealth: Payer: Self-pay | Admitting: Cardiology

## 2022-01-23 NOTE — Telephone Encounter (Signed)
Pt c/o medication issue:  1. Name of Medication:   valsartan (DIOVAN) 40 MG tablet   2. How are you currently taking this medication (dosage and times per day)? As prescribed  3. Are you having a reaction (difficulty breathing--STAT)?     4. What is your medication issue?   Patient stated his dosage for this medication was reduced to 20 mg and he will need a new prescription sent to his pharmacy [facility] - [place], [place] - [address]).  Patient stated he will need further instruction on when/if to start back on the carvedilol (COREG) 6.25 MG tablet.

## 2022-01-23 NOTE — Addendum Note (Signed)
Addended by: Kathyrn Lass on: 01/23/2022 05:19 PM   Modules accepted: Orders

## 2022-01-23 NOTE — Telephone Encounter (Signed)
Spoke to patient Dr.Jordan's advice given.Stated he has not been taking Lasix since 12/17/21.He will hold Coreg and Valsartan until symptoms resolve then he will take Lasix 40 mg 1/2 tablet daily.After 3 days if no symptoms he will restart Valsartan 40 mg daily.He will stay off Coreg for now.Advised to monitor B/P daily.I will call him back on Monday 01/26/22.Advised to call Dr.on call over the weekend if needed or if symptoms worsen go to ED.

## 2022-01-23 NOTE — Telephone Encounter (Signed)
Spoke to patient . He states that after being up for more than 10 to 15 minutes this morning -  Standing up - he felt very dizzy , faint - he states almost fainted. The feeling occurred around 12:20 pm    Patient states he did not carvedilol or valsartan . Only thing he took Aspirin , Jardiance He stated he did drink 3 bottles of water this morning.  No chest pain , slightly nausea while patient is talking to Nurse.  He state he is laying down now with feet elevated.    RN patient to continue lay down,  sit up and sit there a few minutes then stand before walking.  RN informed patient will defer and send message to Dr Martinique.  Verbalizing understanding.

## 2022-01-23 NOTE — Telephone Encounter (Signed)
Spoke with pt regarding change in medications. Pt called yesterday and was told to hold carvedilol and valsartan today (1/19) and to resume medication on Saturday 1/20, Carvedilol 3.125mg  BID and Valsartan reduced to 40mg  once daily. Pt clarified when to resume medications. Pt also mentions that he doesn't believe that the pharmacy has the new dosage change for valsartan. Instructed pt that I will give them a call to check. Pt verbalizes understanding.  [facility], they did receive Valsartan 40mg  prescription, however pt called asking about a prescription but didn't know the name and was saying something about decreasing 40mg  to 20mg  so they were waiting for clarification on what to fill. To clarify pt is to decrease valsartan from 80mg  to 40mg . Pharmacy Tech verbalizes understanding.

## 2022-01-23 NOTE — Telephone Encounter (Signed)
Pt c/o BP issue: STAT if pt c/o blurred vision, one-sided weakness or slurred speech  1. What are your last 5 BP readings?  12:00 PM: 68/41 12/20 PM: 73/43 12:40 PM: 102/74  2. Are you having any other symptoms (ex. Dizziness, headache, blurred vision, passed out)?  Patient felt faint when he got up to brush his teeth  3. What is your BP issue?  Patient is following up stating that his BP has dropped even lower.

## 2022-01-25 DIAGNOSIS — M545 Low back pain, unspecified: Secondary | ICD-10-CM | POA: Diagnosis not present

## 2022-01-25 DIAGNOSIS — S32009K Unspecified fracture of unspecified lumbar vertebra, subsequent encounter for fracture with nonunion: Secondary | ICD-10-CM | POA: Diagnosis not present

## 2022-01-25 DIAGNOSIS — M1711 Unilateral primary osteoarthritis, right knee: Secondary | ICD-10-CM | POA: Diagnosis not present

## 2022-01-27 DIAGNOSIS — M25661 Stiffness of right knee, not elsewhere classified: Secondary | ICD-10-CM | POA: Diagnosis not present

## 2022-01-27 DIAGNOSIS — M25561 Pain in right knee: Secondary | ICD-10-CM | POA: Diagnosis not present

## 2022-01-28 NOTE — Telephone Encounter (Signed)
Spoke with pt, Saturday his bp ranged from 187/131 to 113/77. Sunday 93/76 and 106/920. Monday 107/77-197/99. Yesterday he only had one episode of low bp and then the rest of the day it was fine. He was able to complete his full PT and felt good after doing that and his bp stayed 127/70's. Today he feels okay and he is currently taking 1/2 of the 80 mg valsartan and baby aspirin. Aware will forward information to dr Martinique.

## 2022-01-29 DIAGNOSIS — M25561 Pain in right knee: Secondary | ICD-10-CM | POA: Diagnosis not present

## 2022-01-29 DIAGNOSIS — M25661 Stiffness of right knee, not elsewhere classified: Secondary | ICD-10-CM | POA: Diagnosis not present

## 2022-01-29 MED ORDER — CARVEDILOL 6.25 MG PO TABS: 3.1250 mg | ORAL_TABLET | Freq: Two times a day (BID) | ORAL | Status: DC

## 2022-01-29 NOTE — Telephone Encounter (Signed)
Spoke with pt, aware of dr Doug Sou recommendations. He will take 1/2 of the 6.25 mg tablet he has twice daily. He will let us know how he feels. He reports the rest of yesterday and so far today, he feels fine.

## 2022-01-29 NOTE — Addendum Note (Signed)
Addended by: Cristopher Estimable on: 01/29/2022 11:19 AM   Modules accepted: Orders

## 2022-01-30 ENCOUNTER — Other Ambulatory Visit (HOSPITAL_COMMUNITY): Payer: Self-pay

## 2022-01-30 ENCOUNTER — Telehealth: Payer: Self-pay | Admitting: Cardiology

## 2022-01-30 NOTE — Telephone Encounter (Signed)
Patient stated he is confused on what medication he is to be taking.  His current med list: ASA 81mg  daily Carvedilol 3.125mg  twice daily Jardiance 10mg  before breakfast Lasix 20mg  daily NTG 0.4mg  prn Repatha 140mg  every 2 weeks Valsartan 40mg  daily No Plavix?  Please advise what medications the patient is to be taking.

## 2022-01-30 NOTE — Telephone Encounter (Signed)
Reviewed medication orders with patient. He stated he is not going to take Plavix, that he was off of it after 6 months. He also stated he is not going to take lasix, because he had knee surgery and is not going to keep getting up to the bathroom, especially at night. Recommended that if he has any swelling or SOB, to contact clinic. He stated, "I'm always short of breath." I explained that if the SOB gets worse to let us know.

## 2022-01-30 NOTE — Telephone Encounter (Signed)
Pt c/o medication issue:  1. Name of Medication:   All  2. How are you currently taking this medication (dosage and times per day)?   Only taking one medication until yesterday  3. Are you having a reaction (difficulty breathing--STAT)?   4. What is your medication issue?    Patient stated Dr. Martinique had stopped him taking his medications and when he went to pick up medication at the pharmacy there were 8 prescriptions for him to pick up.  Patient would like a call back to discuss which medications he should be taking.  Patient stated he started back on the carvedilol (COREG) 6.25 MG tablet yesterday.

## 2022-02-03 DIAGNOSIS — M25561 Pain in right knee: Secondary | ICD-10-CM | POA: Diagnosis not present

## 2022-02-03 DIAGNOSIS — M25661 Stiffness of right knee, not elsewhere classified: Secondary | ICD-10-CM | POA: Diagnosis not present

## 2022-02-04 NOTE — Progress Notes (Signed)
Remote ICD transmission.   

## 2022-02-05 DIAGNOSIS — M25661 Stiffness of right knee, not elsewhere classified: Secondary | ICD-10-CM | POA: Diagnosis not present

## 2022-02-05 DIAGNOSIS — M25561 Pain in right knee: Secondary | ICD-10-CM | POA: Diagnosis not present

## 2022-02-10 DIAGNOSIS — M25561 Pain in right knee: Secondary | ICD-10-CM | POA: Diagnosis not present

## 2022-02-10 DIAGNOSIS — M25661 Stiffness of right knee, not elsewhere classified: Secondary | ICD-10-CM | POA: Diagnosis not present

## 2022-02-12 DIAGNOSIS — M25561 Pain in right knee: Secondary | ICD-10-CM | POA: Diagnosis not present

## 2022-02-12 DIAGNOSIS — M25661 Stiffness of right knee, not elsewhere classified: Secondary | ICD-10-CM | POA: Diagnosis not present

## 2022-02-17 DIAGNOSIS — M25661 Stiffness of right knee, not elsewhere classified: Secondary | ICD-10-CM | POA: Diagnosis not present

## 2022-02-17 DIAGNOSIS — M25561 Pain in right knee: Secondary | ICD-10-CM | POA: Diagnosis not present

## 2022-02-19 DIAGNOSIS — M25561 Pain in right knee: Secondary | ICD-10-CM | POA: Diagnosis not present

## 2022-02-19 DIAGNOSIS — M25661 Stiffness of right knee, not elsewhere classified: Secondary | ICD-10-CM | POA: Diagnosis not present

## 2022-02-24 DIAGNOSIS — M25661 Stiffness of right knee, not elsewhere classified: Secondary | ICD-10-CM | POA: Diagnosis not present

## 2022-02-24 DIAGNOSIS — M25561 Pain in right knee: Secondary | ICD-10-CM | POA: Diagnosis not present

## 2022-02-26 DIAGNOSIS — M25561 Pain in right knee: Secondary | ICD-10-CM | POA: Diagnosis not present

## 2022-02-26 DIAGNOSIS — M25661 Stiffness of right knee, not elsewhere classified: Secondary | ICD-10-CM | POA: Diagnosis not present

## 2022-03-03 DIAGNOSIS — M25661 Stiffness of right knee, not elsewhere classified: Secondary | ICD-10-CM | POA: Diagnosis not present

## 2022-03-03 DIAGNOSIS — M25561 Pain in right knee: Secondary | ICD-10-CM | POA: Diagnosis not present

## 2022-03-05 DIAGNOSIS — M25561 Pain in right knee: Secondary | ICD-10-CM | POA: Diagnosis not present

## 2022-03-05 DIAGNOSIS — M25661 Stiffness of right knee, not elsewhere classified: Secondary | ICD-10-CM | POA: Diagnosis not present

## 2022-03-06 DIAGNOSIS — Z4789 Encounter for other orthopedic aftercare: Secondary | ICD-10-CM | POA: Diagnosis not present

## 2022-03-10 DIAGNOSIS — M25661 Stiffness of right knee, not elsewhere classified: Secondary | ICD-10-CM | POA: Diagnosis not present

## 2022-03-10 DIAGNOSIS — M25561 Pain in right knee: Secondary | ICD-10-CM | POA: Diagnosis not present

## 2022-03-11 ENCOUNTER — Ambulatory Visit (HOSPITAL_COMMUNITY)
Admission: RE | Admit: 2022-03-11 | Discharge: 2022-03-11 | Disposition: A | Discharge status: Home / Self Care | Payer: PPO | Source: Ambulatory Visit | Attending: Internal Medicine | Admitting: Internal Medicine

## 2022-03-11 ENCOUNTER — Other Ambulatory Visit (HOSPITAL_COMMUNITY): Payer: Self-pay | Admitting: Specialist

## 2022-03-11 DIAGNOSIS — M79604 Pain in right leg: Secondary | ICD-10-CM | POA: Diagnosis not present

## 2022-03-11 DIAGNOSIS — M7989 Other specified soft tissue disorders: Secondary | ICD-10-CM

## 2022-03-17 ENCOUNTER — Telehealth: Payer: Self-pay

## 2022-03-17 DIAGNOSIS — M25661 Stiffness of right knee, not elsewhere classified: Secondary | ICD-10-CM | POA: Diagnosis not present

## 2022-03-17 DIAGNOSIS — M25561 Pain in right knee: Secondary | ICD-10-CM | POA: Diagnosis not present

## 2022-03-17 MED ORDER — EMPAGLIFLOZIN 10 MG PO TABS: 10.0000 mg | ORAL_TABLET | Freq: Every day | ORAL | 3 refills | Status: DC

## 2022-03-17 MED ORDER — EMPAGLIFLOZIN 10 MG PO TABS: 10.0000 mg | ORAL_TABLET | Freq: Every day | ORAL | 0 refills | Status: DC

## 2022-03-17 NOTE — Telephone Encounter (Signed)
Spoke with pt, let him know that BI application has been faxed and confirmation of successful fax received. Pt is out of samples. Will leave some at the desk for him. He verbalizes understanding.    Medication samples have been provided to the patient.  Drug name: [name]: 3 boxes LOT: [id number] Exp.Date: Jan 2026  Samples left at front desk for patient pick-up. Patient notified.

## 2022-03-18 ENCOUNTER — Other Ambulatory Visit: Payer: Self-pay | Admitting: Family Medicine

## 2022-03-18 NOTE — Telephone Encounter (Signed)
Patient is requesting a refill of the following medications: Requested Prescriptions   Pending Prescriptions Disp Refills   zolpidem (AMBIEN) 10 MG tablet [Pharmacy Med Name: zolpidem 10 mg tablet] 30 tablet 1    Sig: TAKE 1 TABLET BY MOUTH AT BEDTIME    Date of last refill: 01/25/2022  Refill amount: 30/1 refill   Treatment agreement date: Not on file. I called patient and scheduled a routine follow-up visit for 05/13/2022 and noted to sign treatment agreement

## 2022-03-19 DIAGNOSIS — M25561 Pain in right knee: Secondary | ICD-10-CM | POA: Diagnosis not present

## 2022-03-19 DIAGNOSIS — M25661 Stiffness of right knee, not elsewhere classified: Secondary | ICD-10-CM | POA: Diagnosis not present

## 2022-03-24 DIAGNOSIS — M25661 Stiffness of right knee, not elsewhere classified: Secondary | ICD-10-CM | POA: Diagnosis not present

## 2022-03-24 DIAGNOSIS — M25561 Pain in right knee: Secondary | ICD-10-CM | POA: Diagnosis not present

## 2022-03-26 DIAGNOSIS — M25561 Pain in right knee: Secondary | ICD-10-CM | POA: Diagnosis not present

## 2022-03-26 DIAGNOSIS — M25661 Stiffness of right knee, not elsewhere classified: Secondary | ICD-10-CM | POA: Diagnosis not present

## 2022-03-27 ENCOUNTER — Telehealth: Payer: Self-pay | Admitting: Pharmacist

## 2022-03-27 ENCOUNTER — Telehealth: Payer: Self-pay

## 2022-03-27 ENCOUNTER — Other Ambulatory Visit (HOSPITAL_COMMUNITY): Payer: Self-pay

## 2022-03-27 NOTE — Telephone Encounter (Signed)
Patient called and reports his copay for Jardiance was too expensive and was denied for patient assistance. Will check cost of [place]

## 2022-03-27 NOTE — Telephone Encounter (Signed)
Called and spoke with patient and advised 1 month copay for Wilder Glade was 47.00. Patient declines. Does not want to spend more than 20 dollars on any medications.

## 2022-03-31 DIAGNOSIS — M25661 Stiffness of right knee, not elsewhere classified: Secondary | ICD-10-CM | POA: Diagnosis not present

## 2022-03-31 DIAGNOSIS — M25561 Pain in right knee: Secondary | ICD-10-CM | POA: Diagnosis not present

## 2022-04-02 ENCOUNTER — Telehealth: Payer: Self-pay | Admitting: Cardiology

## 2022-04-02 NOTE — Telephone Encounter (Signed)
Patient is returning call stating he only wants to speak with Dr. Martinique or his nurse about this medication issue.

## 2022-04-02 NOTE — Telephone Encounter (Signed)
When I spoke with patient last week he was not willing to pay for Jardiance or Farxiga. He was denied patient assistance. If he changes his mind, the Wilder Glade will be less expensive (47 a month). We will be happy if it takes either one (but not both together). Currently only on samples of Jardiance.

## 2022-04-02 NOTE — Telephone Encounter (Signed)
Returned call to pt and expressed Dr Morrison Old message. He states that he does have all of these med and he will take them all and call back with any questions or if anything else is needed

## 2022-04-02 NOTE — Telephone Encounter (Signed)
Spoke with pt he states that he cannot afford [place] or [place]. Was trying discuss this and trying to ask him what he wants ..then he hung up.

## 2022-04-02 NOTE — Telephone Encounter (Signed)
Returned call again to pt he states that he has not taken any medications today because he is unsure what he needs to take started to discussed but he says that he want to talk to [place] or Dr Martinique to go over his medications since he is so confused. Informed pt that they are out of the office and Dr Martinique is in the cath lab, verbalized understanding.

## 2022-04-02 NOTE — Telephone Encounter (Signed)
Pt c/o medication issue:  1. Name of Medication: empagliflozin (JARDIANCE) 10 MG TABS tablet and Farxiga 10MG   2. How are you currently taking this medication (dosage and times per day)? As written  3. Are you having a reaction (difficulty breathing--STAT)? No  4. What is your medication issue? Patient stated that his medication keeps getting changed and is confused on which medication he is supposed to be taking. Please advise

## 2022-04-03 DIAGNOSIS — M25551 Pain in right hip: Secondary | ICD-10-CM | POA: Diagnosis not present

## 2022-04-03 DIAGNOSIS — M25552 Pain in left hip: Secondary | ICD-10-CM | POA: Diagnosis not present

## 2022-04-16 ENCOUNTER — Other Ambulatory Visit: Payer: Self-pay | Admitting: Cardiology

## 2022-04-29 ENCOUNTER — Encounter: Payer: Self-pay | Admitting: Adult Health

## 2022-04-29 ENCOUNTER — Ambulatory Visit (INDEPENDENT_AMBULATORY_CARE_PROVIDER_SITE_OTHER): Payer: PPO | Admitting: Adult Health

## 2022-04-29 VITALS — BP 120/70 | HR 92 | Temp 97.8°F | Resp 16 | Ht 74.0 in | Wt 227.8 lb

## 2022-04-29 DIAGNOSIS — M545 Low back pain, unspecified: Secondary | ICD-10-CM

## 2022-04-29 DIAGNOSIS — R059 Cough, unspecified: Secondary | ICD-10-CM

## 2022-04-29 DIAGNOSIS — R300 Dysuria: Secondary | ICD-10-CM | POA: Diagnosis not present

## 2022-04-29 DIAGNOSIS — J029 Acute pharyngitis, unspecified: Secondary | ICD-10-CM

## 2022-04-29 DIAGNOSIS — H9209 Otalgia, unspecified ear: Secondary | ICD-10-CM | POA: Diagnosis not present

## 2022-04-29 LAB — POCT INFLUENZA A/B
Influenza A, POC: NEGATIVE
Influenza B, POC: NEGATIVE

## 2022-04-29 LAB — POCT URINALYSIS DIPSTICK
Bilirubin, UA: NEGATIVE
Glucose, UA: NEGATIVE
Nitrite, UA: NEGATIVE
Protein, UA: NEGATIVE
Spec Grav, UA: 1.02 (ref 1.010–1.025)
Urobilinogen, UA: NEGATIVE E.U./dL — AB
pH, UA: 6 (ref 5.0–8.0)

## 2022-04-29 LAB — POCT RAPID STREP A (OFFICE): Rapid Strep A Screen: NEGATIVE

## 2022-04-29 MED ORDER — DM-GUAIFENESIN ER 30-600 MG PO TB12: 1.0000 | ORAL_TABLET | Freq: Two times a day (BID) | ORAL | 0 refills | Status: AC

## 2022-04-29 MED ORDER — CEPHALEXIN 500 MG PO CAPS: 500.0000 mg | ORAL_CAPSULE | Freq: Two times a day (BID) | ORAL | 0 refills | Status: DC

## 2022-04-29 NOTE — Progress Notes (Signed)
[facility] clinic  Provider:  Kenard Gower DNP  Code Status:  Full Code  Goals of Care:     04/29/2022    9:26 AM  Advanced Directives  Does Patient Have a Medical Advance Directive? No  Would patient like information on creating a medical advance directive? No - Patient declined     Chief Complaint  Patient presents with   Acute Visit    Patient complains of possible Sinus infection/UTI. Patient sinus symptoms are fatigue,nasal congestion, ear ache, headache, and coughing up phlegm. Patient UTI symptoms are back pain, dysuria, puss discharge.      HPI: Patient is a 67 y.o. [name] seen today for an acute visit for possible sinus infection and UTI. He complained of sore throat, nasal and frontal sinus pain x 1 week. He is also having productive cough with brownish phlegm.  He denies fever nor chills. He complains of having low back pain, dysuria and pus discharge.  Urine dipstick done today showed moderate blood, negative nitrates, small leukocyte and negative protein.   Negative for Influenza A/B  Negative for rapid strep  Past Medical History:  Diagnosis Date   AICD (automatic cardioverter/defibrillator) present    Anxiety    Arthritis    "mild; back, neck, spine" (06/09/2017)   CHF (congestive heart failure)    Chronic back pain    Coronary artery disease    GERD (gastroesophageal reflux disease)    History of gout    History of kidney stones X 2   Hypercholesterolemia    Hypertension    LBBB (left bundle branch block)    MI (myocardial infarction) 1993   LATERAL   MI (myocardial infarction) ?09/2001; 06/07/2017   Pneumonia    "several times" (06/09/2017)   Pre-diabetes    S/P coronary artery stent placement    RCA   Varicose veins     Past Surgical History:  Procedure Laterality Date   ANTERIOR CERVICAL DECOMP/DISCECTOMY FUSION  03/03/2011   Procedure: ANTERIOR CERVICAL DECOMPRESSION/DISCECTOMY FUSION 3 LEVELS;  Surgeon: Karn Cassis, MD;  Location: [facility]  NEURO ORS;  Service: Neurosurgery;  Laterality: N/A;  Cervical three-four Cervical four-five Cervical five-six Anterior cervical decompression/diskectomy, fusion   APPLICATION OF ROBOTIC ASSISTANCE FOR SPINAL PROCEDURE N/A 10/03/2020   Procedure: APPLICATION OF ROBOTIC ASSISTANCE FOR SPINAL PROCEDURE;  Surgeon: Lisbeth Renshaw, MD;  Location: [facility] OR;  Service: Neurosurgery;  Laterality: N/A;   BACK SURGERY     CARDIAC CATHETERIZATION  2009   Stents in RCA patent. 70 to 80% PL, and 50% ostial PD.    CARDIAC CATHETERIZATION N/A 11/20/2014   Procedure: Left Heart Cath and Coronary Angiography;  Surgeon: Peter M Swaziland, MD;  Location: [facility] INVASIVE CV LAB;  Service: Cardiovascular;  Laterality: N/A;   CORONARY ANGIOPLASTY     DIRECT ANGIOPLASTY THE MARGINAL BRANCH   CORONARY ANGIOPLASTY WITH STENT PLACEMENT     RCA   CORONARY ARTERY BYPASS GRAFT N/A 06/14/2017   Procedure: CORONARY ARTERY BYPASS GRAFTING (CABG) x four, using left internal mammary artery and right leg greater saphenous vein harvested endoscopically;  Surgeon: Kerin Perna, MD;  Location: [facility] OR;  Service: Open Heart Surgery;  Laterality: N/A;   CORONARY PRESSURE/FFR STUDY N/A 06/24/2021   Procedure: INTRAVASCULAR PRESSURE WIRE/FFR STUDY;  Surgeon: Marykay Lex, MD;  Location: [facility] INVASIVE CV LAB;  Service: Cardiovascular;  Laterality: N/A;   CORONARY STENT INTERVENTION N/A 06/24/2021   Procedure: CORONARY STENT INTERVENTION;  Surgeon: Marykay Lex, MD;  Location: [facility]  INVASIVE CV LAB;  Service: Cardiovascular;  Laterality: N/A;   CYSTOSCOPY N/A 06/14/2017   Procedure: CYSTOSCOPY;  Surgeon: Kerin Perna, MD;  Location: [facility] OR;  Service: Open Heart Surgery;  Laterality: N/A;   ICD IMPLANT N/A 03/11/2018   Procedure: ICD IMPLANT - Dual Chamber;  Surgeon: [name], MD;  Location: [facility] INVASIVE CV LAB;  Service: Cardiovascular;  Laterality: N/A;   INSERTION OF SUPRAPUBIC CATHETER N/A 06/14/2017   Procedure: INSERTION OF  SUPRAPUBIC CATHETER - Lower abdomen;  Surgeon: Kerin Perna, MD;  Location: [facility] OR;  Service: Open Heart Surgery;  Laterality: N/A;   POSTERIOR LUMBAR FUSION  10/2011   RIGHT/LEFT HEART CATH AND CORONARY ANGIOGRAPHY N/A 06/08/2017   Procedure: RIGHT/LEFT HEART CATH AND CORONARY ANGIOGRAPHY;  Surgeon: Swaziland, Peter M, MD;  Location: [facility] INVASIVE CV LAB;  Service: Cardiovascular;  Laterality: N/A;   RIGHT/LEFT HEART CATH AND CORONARY/GRAFT ANGIOGRAPHY N/A 09/06/2020   Procedure: RIGHT/LEFT HEART CATH AND CORONARY/GRAFT ANGIOGRAPHY;  Surgeon: Swaziland, Peter M, MD;  Location: [facility] INVASIVE CV LAB;  Service: Cardiovascular;  Laterality: N/A;   RIGHT/LEFT HEART CATH AND CORONARY/GRAFT ANGIOGRAPHY N/A 06/24/2021   Procedure: RIGHT/LEFT HEART CATH AND CORONARY/GRAFT ANGIOGRAPHY;  Surgeon: Marykay Lex, MD;  Location: [facility] INVASIVE CV LAB;  Service: Cardiovascular;  Laterality: N/A;   TEE WITHOUT CARDIOVERSION N/A 06/14/2017   Procedure: TRANSESOPHAGEAL ECHOCARDIOGRAM (TEE);  Surgeon: Donata Clay, Theron Arista, MD;  Location: [facility] OR;  Service: Open Heart Surgery;  Laterality: N/A;   TOTAL KNEE ARTHROPLASTY Right 12/17/2021   Procedure: TOTAL KNEE ARTHROPLASTY;  Surgeon: Eugenia Mcalpine, MD;  Location: [facility] ORS;  Service: Orthopedics;  Laterality: Right;  adductor canal  120   URETHROPLASTY N/A 10/15/2017   Procedure: URETHROPLASTY WITH BUCCAL GRAFT HARVAST;  Surgeon: Crist Fat, MD;  Location: [facility] ORS;  Service: Urology;  Laterality: N/A;    Allergies  Allergen Reactions   Ezetimibe Other (See Comments)    Joint pain and drops BP   Niacin Other (See Comments)    Drops BP and severe joint/muscle pain   Statins Other (See Comments)    Severe joint pain and drops BP.    Outpatient Encounter Medications as of 04/29/2022  Medication Sig   acetaminophen (TYLENOL) 500 MG tablet Take 1,000 mg by mouth 3 (three) times daily.   Ascorbic Acid (SUPER C COMPLEX PO) Take 1 tablet by mouth 3 (three) times daily.    aspirin 81 MG EC tablet Take 81 mg by mouth in the morning.   bisacodyl (DULCOLAX) 5 MG EC tablet Take 1 tablet (5 mg total) by mouth daily as needed for moderate constipation.   carvedilol (COREG) 6.25 MG tablet Take 0.5 tablets (3.125 mg total) by mouth 2 (two) times daily.   cetirizine (ZYRTEC) 10 MG tablet Take 10 mg by mouth daily as needed for allergies.   esomeprazole (NEXIUM) 20 MG capsule Take 20 mg by mouth daily at 12 noon.   furosemide (LASIX) 40 MG tablet Take 20 mg by mouth daily as needed for edema or fluid. Take 1/2 tablet ( 20 mg ) daily   gabapentin (NEURONTIN) 300 MG capsule Take 1 capsule by mouth at bedtime.   methocarbamol (ROBAXIN) 500 MG tablet Take 1 tablet (500 mg total) by mouth every 6 (six) hours as needed for muscle spasms.   Misc Natural Products (DEEP SLEEP) CAPS Take 2 capsules by mouth at bedtime.   Multiple Vitamins-Minerals (AIRBORNE GUMMIES PO) Take 3 capsules by mouth 2 (two) times daily.  nitroGLYCERIN (NITROSTAT) 0.4 MG SL tablet Place 1 tablet (0.4 mg total) under the tongue every 5 (five) minutes x 3 doses as needed for chest pain.   ondansetron (ZOFRAN) 4 MG tablet Take 1 tablet (4 mg total) by mouth every 6 (six) hours as needed for nausea.   pantoprazole (PROTONIX) 40 MG tablet TAKE 1 TABLET BY MOUTH EVERY DAY   REPATHA SURECLICK 140 MG/ML SOAJ INJECT into THE SKIN EVERY 14 DAYS   valsartan (DIOVAN) 40 MG tablet Take 1 tablet (40 mg total) by mouth daily.   zolpidem (AMBIEN) 10 MG tablet TAKE 1 TABLET BY MOUTH AT BEDTIME   [DISCONTINUED] Aromatic Inhalants (VICKS VAPOINHALER IN) Inhale 1 puff into the lungs daily as needed (congestion).   [DISCONTINUED] empagliflozin (JARDIANCE) 10 MG TABS tablet Take 1 tablet (10 mg total) by mouth daily before breakfast.   [DISCONTINUED] HYDROcodone-acetaminophen (NORCO/VICODIN) 5-325 MG tablet Take 1-2 tablets by mouth every 6 (six) hours as needed for moderate pain.   [DISCONTINUED] oxyCODONE (OXY IR/ROXICODONE) 5  MG immediate release tablet Take 1-2 tablets (5-10 mg total) by mouth every 4 (four) hours as needed for severe pain (pain score 4-6). (Patient not taking: Reported on 01/16/2022)   [DISCONTINUED] traMADol (ULTRAM) 50 MG tablet Take 1 tablet (50 mg total) by mouth every 6 (six) hours. (Patient not taking: Reported on 01/16/2022)   No facility-administered encounter medications on file as of 04/29/2022.    Review of Systems:  Review of Systems  Constitutional:  Positive for fatigue. Negative for activity change, appetite change and fever.  HENT:  Positive for congestion, ear pain, sinus pressure, sinus pain and sore throat.   Eyes: Negative.   Respiratory:  Positive for cough. Negative for shortness of breath and wheezing.   Cardiovascular:  Negative for chest pain and leg swelling.  Gastrointestinal:  Negative for abdominal distention, diarrhea and vomiting.  Genitourinary:  Positive for dysuria, flank pain and penile discharge. Negative for frequency and urgency.  Skin:  Negative for color change.  Neurological:  Positive for headaches. Negative for dizziness.  Psychiatric/Behavioral:  Negative for behavioral problems and sleep disturbance. The patient is not nervous/anxious.     Health Maintenance  Topic Date Due   Zoster Vaccines- Shingrix (2 of 2) 03/12/2021   Medicare Annual Wellness (AWV)  08/[name]/2023   COVID-19 Vaccine (4 - 2023-24 season) 09/05/2021   INFLUENZA VACCINE  08/06/2022   COLONOSCOPY (Pts 45-58yrs Insurance coverage will need to be confirmed)  02/14/2025   DTaP/Tdap/Td (2 - Td or Tdap) 02/26/2026   Pneumonia Vaccine 22+ Years old  Completed   HPV VACCINES  Aged Out   Hepatitis C Screening  Discontinued    Physical Exam: Vitals:   04/29/22 0918  BP: 120/70  Pulse: 92  Resp: 16  Temp: 97.8 F (36.6 C)  SpO2: 93%  Weight: 227 lb 12.8 oz (103.3 kg)  Height: 6\' 2"  (1.88 m)   Body mass index is 29.25 kg/m. Physical Exam Constitutional:      Appearance: Normal  appearance.  HENT:     Head: Normocephalic and atraumatic.     Nose: Congestion present.     Mouth/Throat:     Mouth: Mucous membranes are moist.  Eyes:     Conjunctiva/sclera: Conjunctivae normal.  Cardiovascular:     Rate and Rhythm: Normal rate and regular rhythm.     Pulses: Normal pulses.     Heart sounds: Normal heart sounds.  Pulmonary:     Effort: Pulmonary effort is normal.  Breath sounds: Normal breath sounds.  Abdominal:     General: Bowel sounds are normal.     Palpations: Abdomen is soft.  Musculoskeletal:        General: No swelling. Normal range of motion.     Cervical back: Normal range of motion.  Skin:    General: Skin is warm and dry.  Neurological:     General: No focal deficit present.     Mental Status: He is alert and oriented to person, place, and time.  Psychiatric:        Mood and Affect: Mood normal.        Behavior: Behavior normal.        Thought Content: Thought content normal.        Judgment: Judgment normal.     Labs reviewed: Basic Metabolic Panel: Recent Labs    12/08/21 1039 12/17/21 1647 12/18/21 0340  NA 140 139 139  K 5.0 4.1 5.1  CL 104 104 102  CO2 GLUCOSE 87 159* 145*  BUN 15 20 24*  CREATININE 1.04 0.95 1.11  CALCIUM 9.4 8.5* 9.0   Liver Function Tests: Recent Labs    05/15/21 1545 06/23/21 1408 12/08/21 1039  AST 39 25 20  ALT 42 18 22  ALKPHOS 51 50 49  BILITOT 1.2 1.5* 0.9  PROT 6.5 6.4* 6.8  ALBUMIN 4.4 3.7 3.8   Recent Labs    05/15/21 1545  LIPASE 12   No results for input(s): "AMMONIA" in the last 8760 hours. CBC: Recent Labs    05/15/21 1545 06/23/21 1408 06/24/21 0151 12/08/21 1039 12/17/21 1647 12/18/21 0340  WBC 9.4 8.4   < > 7.2 8.2 12.9*  NEUTROABS 7.7 6.5  --   --   --   --   HGB 13.6 15.0   < > 15.3 13.3 13.1  HCT 42.7 45.0   < > 47.9 40.9 40.0  MCV 97.0 95.1   < > 100.6* 101.0* 99.8  PLT 217 209   < > 209 177 222   < > = values in this interval not displayed.    Lipid Panel: No results for input(s): "CHOL", "HDL", "LDLCALC", "TRIG", "CHOLHDL", "LDLDIRECT" in the last 8760 hours. Lab Results  Component Value Date   HGBA1C 6.4 (H) 12/08/2021    Procedures since last visit: No results found.  Assessment/Plan  1. Acute pharyngitis, unspecified etiology -  negative rapid strep A and influenza A/B - cephALEXin (KEFLEX) 500 MG capsule; Take 1 capsule (500 mg total) by mouth 2 (two) times daily for 10 days.  Dispense: 20 capsule; Refill: 0  2. Low back pain, unspecified back pain laterality, unspecified chronicity, unspecified whether sciatica present  Dysuria - cephALEXin (KEFLEX) 500 MG capsule; Take 1 capsule (500 mg total) by mouth 2 (two) times daily for 10 days.  Dispense: 20 capsule; Refill: 0 - POC Urinalysis Dipstick showed moderate blood, negative nitrates, small leukocyte and negative protein - Urine Culture  3. Ear ache -  no redness - POC Influenza A/B negative - POC Rapid Strep A negative  4. Cough, unspecified type - dextromethorphan-guaiFENesin (MUCINEX DM) [name]-600 MG 12hr tablet; Take 1 tablet by mouth 2 (two) times daily for 14 days.  Dispense: 28 tablet; Refill: 0 - POC Influenza A/B negative - POC Rapid Strep A negative   Labs/tests ordered:  POC Influenza A/B, POC rapid strep A, POC urine dipstick and urine culture  Next appt:  05/13/2022

## 2022-05-01 ENCOUNTER — Emergency Department (HOSPITAL_BASED_OUTPATIENT_CLINIC_OR_DEPARTMENT_OTHER): Payer: PPO

## 2022-05-01 ENCOUNTER — Encounter (HOSPITAL_BASED_OUTPATIENT_CLINIC_OR_DEPARTMENT_OTHER): Payer: Self-pay | Admitting: Emergency Medicine

## 2022-05-01 ENCOUNTER — Emergency Department (HOSPITAL_BASED_OUTPATIENT_CLINIC_OR_DEPARTMENT_OTHER)
Admission: EM | Admit: 2022-05-01 | Discharge: 2022-05-01 | Disposition: A | Discharge status: Home / Self Care | Payer: PPO | Attending: Emergency Medicine | Admitting: Emergency Medicine

## 2022-05-01 ENCOUNTER — Other Ambulatory Visit: Payer: Self-pay

## 2022-05-01 DIAGNOSIS — R0789 Other chest pain: Secondary | ICD-10-CM | POA: Diagnosis not present

## 2022-05-01 DIAGNOSIS — Z1152 Encounter for screening for COVID-19: Secondary | ICD-10-CM | POA: Diagnosis not present

## 2022-05-01 DIAGNOSIS — I509 Heart failure, unspecified: Secondary | ICD-10-CM | POA: Insufficient documentation

## 2022-05-01 DIAGNOSIS — I110 Hypertensive heart disease with heart failure: Secondary | ICD-10-CM | POA: Insufficient documentation

## 2022-05-01 DIAGNOSIS — Z95810 Presence of automatic (implantable) cardiac defibrillator: Secondary | ICD-10-CM | POA: Insufficient documentation

## 2022-05-01 DIAGNOSIS — R059 Cough, unspecified: Secondary | ICD-10-CM | POA: Diagnosis not present

## 2022-05-01 DIAGNOSIS — R051 Acute cough: Secondary | ICD-10-CM | POA: Insufficient documentation

## 2022-05-01 DIAGNOSIS — R42 Dizziness and giddiness: Secondary | ICD-10-CM | POA: Insufficient documentation

## 2022-05-01 DIAGNOSIS — R079 Chest pain, unspecified: Secondary | ICD-10-CM | POA: Diagnosis not present

## 2022-05-01 DIAGNOSIS — J9811 Atelectasis: Secondary | ICD-10-CM | POA: Diagnosis not present

## 2022-05-01 LAB — BASIC METABOLIC PANEL
Anion gap: 11 (ref 5–15)
BUN: 18 mg/dL (ref 8–23)
CO2: 27 mmol/L (ref 22–32)
Calcium: 9.1 mg/dL (ref 8.9–10.3)
Chloride: 98 mmol/L (ref 98–111)
Creatinine, Ser: 0.9 mg/dL (ref 0.61–1.24)
GFR, Estimated: >60 mL/min (ref >60)
Glucose, Bld: 100 mg/dL — ABNORMAL HIGH (ref 70–99)
Potassium: 4.3 mmol/L (ref 3.5–5.1)
Sodium: 136 mmol/L (ref 135–145)

## 2022-05-01 LAB — URINE CULTURE
MICRO NUMBER:: 14869617
SPECIMEN QUALITY:: ADEQUATE

## 2022-05-01 LAB — HEPATIC FUNCTION PANEL
ALT: 30 U/L (ref 0–44)
AST: 23 U/L (ref 15–41)
Albumin: 3.7 g/dL (ref 3.5–5.0)
Alkaline Phosphatase: 68 U/L (ref 38–126)
Bilirubin, Direct: 0.2 mg/dL (ref 0.0–0.2)
Indirect Bilirubin: 0.6 mg/dL (ref 0.3–0.9)
Total Bilirubin: 0.8 mg/dL (ref 0.3–1.2)
Total Protein: 6.7 g/dL (ref 6.5–8.1)

## 2022-05-01 LAB — SARS CORONAVIRUS 2 BY RT PCR: SARS Coronavirus 2 by RT PCR: NEGATIVE

## 2022-05-01 LAB — CBC
HCT: 41.4 % (ref 39.0–52.0)
Hemoglobin: 13.5 g/dL (ref 13.0–17.0)
MCH: 32.6 pg (ref 26.0–34.0)
MCHC: 32.6 g/dL (ref 30.0–36.0)
MCV: 100 fL (ref 80.0–100.0)
Platelets: 244 10*3/uL (ref 150–400)
RBC: 4.14 MIL/uL — ABNORMAL LOW (ref 4.22–5.81)
RDW: 14.6 % (ref 11.5–15.5)
WBC: 7.3 10*3/uL (ref 4.0–10.5)
nRBC: 0 % (ref 0.0–0.2)

## 2022-05-01 LAB — CBG MONITORING, ED: Glucose-Capillary: 105 mg/dL — ABNORMAL HIGH (ref 70–99)

## 2022-05-01 LAB — TROPONIN I (HIGH SENSITIVITY)
Troponin I (High Sensitivity): 27 ng/L — ABNORMAL HIGH (ref <18)
Troponin I (High Sensitivity): 26 ng/L — ABNORMAL HIGH (ref <18)

## 2022-05-01 LAB — LIPASE, BLOOD: Lipase: 26 U/L (ref 11–51)

## 2022-05-01 LAB — LACTIC ACID, PLASMA: Lactic Acid, Venous: 1.1 mmol/L (ref 0.5–1.9)

## 2022-05-01 MED ORDER — DOXYCYCLINE HYCLATE 100 MG PO CAPS: 100.0000 mg | ORAL_CAPSULE | Freq: Two times a day (BID) | ORAL | 0 refills | Status: AC

## 2022-05-01 MED ORDER — SODIUM CHLORIDE 0.9 % IV BOLUS
250.0000 mL | Freq: Once | INTRAVENOUS | Status: AC
  Administered 2022-05-01: 250 mL via INTRAVENOUS

## 2022-05-01 NOTE — ED Provider Notes (Signed)
Emergency Department Provider Note   I have reviewed the triage vital signs and the nursing notes.   HISTORY  Chief Complaint Hypotension, Shortness of Breath, Dizziness, and Weakness   HPI [name] is a 67 y.o. male with past history of congestive heart failure with AICD, CAD, hypertension presents to the emergency department with congestion/cough, shortness of breath, lightheadedness with hypotension.  No changes to medications other than 3 days of antibiotic for upper respiratory infection symptoms.  He has had a dry cough over the past week which has become more productive in the past 2 days.  He had significant productive cough today along with shortness of breath and some tightness in the chest.  He notes a prior history of fluid on the lungs and states this feels somewhat similar.  No abdominal pain, vomiting, diarrhea.  He noticed low blood pressures at home along with severe fatigue which ultimately prompted him to come to the ED.  He does take as needed Lasix and has been diuresing over the past week but also notes higher than normal fluid intake attempting to hydrate and flush out his URI.    Past Medical History:  Diagnosis Date   AICD (automatic cardioverter/defibrillator) present    Anxiety    Arthritis    "mild; back, neck, spine" (06/09/2017)   CHF (congestive heart failure) (HCC)    Chronic back pain    Coronary artery disease    GERD (gastroesophageal reflux disease)    History of gout    History of kidney stones X 2   Hypercholesterolemia    Hypertension    LBBB (left bundle branch block)    MI (myocardial infarction) (HCC) 1993   LATERAL   MI (myocardial infarction) (HCC) ?09/2001; 06/07/2017   Pneumonia    "several times" (06/09/2017)   Pre-diabetes    S/P coronary artery stent placement    RCA   Varicose veins     Review of Systems  Constitutional: No fever/chills. Positive fatigue.  Eyes: No visual changes. ENT: No sore throat. Positive URI and  congestion.  Cardiovascular: Positive chest pain. Respiratory: Positive shortness of breath. Gastrointestinal: No abdominal pain.  No nausea, no vomiting.  No diarrhea.  No constipation. Genitourinary: Negative for dysuria. Musculoskeletal: Negative for back pain. Skin: Negative for rash. Neurological: Negative for headaches, focal weakness or numbness.  ____________________________________________   PHYSICAL EXAM:  VITAL SIGNS: ED Triage Vitals  Enc Vitals Group     BP 05/01/22 1606 (!) 87/60     Pulse Rate 05/01/22 1606 76     Resp 05/01/22 1607 18     Temp 05/01/22 1606 98.1 F (36.7 C)     Temp Source 05/01/22 1606 Oral     SpO2 05/01/22 1606 97 %     Weight 05/01/22 1608 225 lb (102.1 kg)     Height 05/01/22 1608 6\' 2"  (1.88 m)   Constitutional: Alert and oriented. Well appearing and in no acute distress. Eyes: Conjunctivae are normal.  Head: Atraumatic. Nose: No congestion/rhinnorhea. Mouth/Throat: Mucous membranes are moist.   Neck: No stridor.   Cardiovascular: Normal rate, regular rhythm. Good peripheral circulation. Grossly normal heart sounds.   Respiratory: Normal respiratory effort.  No retractions. Lungs CTAB. Gastrointestinal: Soft and nontender. No distention.  Musculoskeletal: No lower extremity tenderness nor edema. No gross deformities of extremities. Neurologic:  Normal speech and language. No gross focal neurologic deficits are appreciated.  Skin:  Skin is warm, dry and intact. No rash noted.  ____________________________________________  LABS (all labs ordered are listed, but only abnormal results are displayed)  Labs Reviewed  BASIC METABOLIC PANEL - Abnormal; Notable for the following components:      Result Value   Glucose, Bld 100 (*)    All other components within normal limits  CBC - Abnormal; Notable for the following components:   RBC 4.14 (*)    All other components within normal limits  CBG MONITORING, ED - Abnormal; Notable for  the following components:   Glucose-Capillary 105 (*)    All other components within normal limits  TROPONIN I (HIGH SENSITIVITY) - Abnormal; Notable for the following components:   Troponin I (High Sensitivity) 27 (*)    All other components within normal limits  TROPONIN I (HIGH SENSITIVITY) - Abnormal; Notable for the following components:   Troponin I (High Sensitivity) 26 (*)    All other components within normal limits  SARS CORONAVIRUS 2 BY RT PCR  HEPATIC FUNCTION PANEL  LIPASE, BLOOD  LACTIC ACID, PLASMA  CBG MONITORING, ED   ____________________________________________  EKG   EKG Interpretation  Date/Time:  Friday May 01 2022 16:11:55 EDT Ventricular Rate:  77 PR Interval:  120 QRS Duration: 176 QT Interval:  464 QTC Calculation: 525 R Axis:   39 Text Interpretation: Atrial-sensed ventricular-paced rhythm with Fusion complexes Abnormal ECG When compared with ECG of 25-Jun-2021 05:36, Fusion complexes are now Present Vent. rate has decreased BY   3 BPM Similar to June 2023 Confirmed by Alona Bene [phone]) on 05/01/2022 4:17:52 PM        ____________________________________________  RADIOLOGY  DG Chest Portable 1 View  Result Date: 05/01/2022 CLINICAL DATA:  Cough and chest pain. EXAM: PORTABLE CHEST 1 VIEW COMPARISON:  Chest two views 06/23/2021, 07/31/2020; CT chest 06/23/2021, CT abdomen pelvis 05/15/2021 FINDINGS: Status post median sternotomy and CABG. Left chest wall cardiac AICD is again seen with leads overlying the right atrium and right ventricle. Cardiac silhouette is again mildly enlarged. Mild calcification within the aortic arch. Mild chronic bilateral interstitial thickening. No significant change in left basilar opacification which on 06/23/2021 radiograph and CT related to a small pleural effusion with left basilar opacity on prior CT this was questioned as a chronic pleural effusion/empyema with adjacent round atelectasis. No pleural effusion. ACDF  hardware is partially visualized overlying the lower cervical spine. Severe right glenohumeral osteoarthritis. IMPRESSION: No significant change in left basilar opacification which appears chronic on comparison radiographs and CTs. On prior CTs this was better seen as a small pleural effusion/empyema with adjacent round atelectasis. Electronically Signed   By: Neita Garnet M.D.   On: 05/01/2022 17:26    ____________________________________________   PROCEDURES  Procedure(s) performed:   Procedures  CRITICAL CARE Performed by: Maia Plan Total critical care time: 35 minutes Critical care time was exclusive of separately billable procedures and treating other patients. Critical care was necessary to treat or prevent imminent or life-threatening deterioration. Critical care was time spent personally by me on the following activities: development of treatment plan with patient and/or surrogate as well as nursing, discussions with consultants, evaluation of patient's response to treatment, examination of patient, obtaining history from patient or surrogate, ordering and performing treatments and interventions, ordering and review of laboratory studies, ordering and review of radiographic studies, pulse oximetry and re-evaluation of patient's condition.  Alona Bene, MD Emergency Medicine  ____________________________________________   INITIAL IMPRESSION / ASSESSMENT AND PLAN / ED COURSE  Pertinent labs & imaging results that were available during my  care of the patient were reviewed by me and considered in my medical decision making (see chart for details).   This patient is Presenting for Evaluation of CP, which does require a range of treatment options, and is a complaint that involves a high risk of morbidity and mortality.  The Differential Diagnoses includes but is not exclusive to acute coronary syndrome, aortic dissection, pulmonary embolism, cardiac tamponade, community-acquired  pneumonia, pericarditis, musculoskeletal chest wall pain, etc.   Critical Interventions-    Medications  sodium chloride 0.9 % bolus 250 mL (0 mLs Intravenous Stopped 05/01/22 1818)    Reassessment after intervention: BP improved.    I did obtain Additional Historical Information from wife at bedside.   I decided to review pertinent External Data, and in summary last ECHO with EF 30-35%.   Clinical Laboratory Tests Ordered, included troponin mildly elevated to 27 similar to prior values.  Lactic acid normal.  No leukocytosis.  No acute kidney injury.  LFTs and bilirubin normal.  Radiologic Tests Ordered, included CXR. I independently interpreted the images and agree with radiology interpretation.   Cardiac Monitor Tracing which shows paced rhythm.    Social Determinants of Health Risk patient is not an active smoker.   Medical Decision Making: Summary:  Patient presents emergency department with productive cough, generalized weakness, chest tightness and shortness of breath.  Arrives hypotensive in the 80s but without intervention pressures back to the 120s with resolution of symptoms.  Will give gentle IV fluid bolus as he has started as needed Lasix in the past several days.  Also on day 3 of antibiotics for what sounds more like URI/bronchitis symptoms.  Chest x-ray unchanged from prior.   Reevaluation with update and discussion with patient. He is feeling much better after gentle IVF bolus. Normal orthostatic vitals. Labs reassuring. Considered admit and discussed with patient but will follow closely as outpatient. Plan for watch and wait Rx for bronchitis/early CAP but suspect viral process clinically.   Patient's presentation is most consistent with acute presentation with potential threat to life or bodily function.   Disposition: discharge  ____________________________________________  FINAL CLINICAL IMPRESSION(S) / ED DIAGNOSES  Final diagnoses:  Episodic  lightheadedness  Atypical chest pain  Acute cough     NEW OUTPATIENT MEDICATIONS STARTED DURING THIS VISIT:  Discharge Medication List as of 05/01/2022  7:43 PM     START taking these medications   Details  doxycycline (VIBRAMYCIN) 100 MG capsule Take 1 capsule (100 mg total) by mouth 2 (two) times daily for 7 days., Starting Fri 05/01/2022, Until Fri 05/08/2022, Print        Note:  This document was prepared using Dragon voice recognition software and may include unintentional dictation errors.  Alona Bene, MD, [facility] Emergency Medicine    Aldred Mase, Arlyss Repress, MD 05/06/22 [phone]

## 2022-05-01 NOTE — Discharge Instructions (Signed)
You were seen in the emergency room today with lightheadedness and chest discomfort.  I am prescribing a watch and wait antibiotic which is slightly stronger than the 1 started 3 days ago.  If you have fever or sudden worsening symptoms he could consider changing to this medicine.  I would like for you to follow closely with your cardiologist regarding your sudden drops in blood pressure.  If develop any new or suddenly worsening symptoms which are severe, however, please return to the emergency department immediately for reevaluation.

## 2022-05-01 NOTE — ED Triage Notes (Signed)
Pt arrived from home, POV. Had sinus problems for a few weeks and feels like he has fluid in his lungs. States BP was low at home, hypotensive in triage. Took his Coreg this am. Feels dizzy and weak. SOB on exertion.

## 2022-05-09 NOTE — Progress Notes (Signed)
Cardiology Office Note:    Date:  05/14/2022   ID:  [name], DOB [birth year], MRN [id number]  PCP:  Frederica Kuster, MD  Cardiologist:  Terese Heier [place], MD  Electrophysiologist:  Sherryl Manges, MD   Referring MD: Frederica Kuster, MD   Chief Complaint  Patient presents with   Congestive Heart Failure     History of Present Illness:    [name] is a 67 y.o. male with a hx of CAD s/p CABG, hypertension, left bundle branch block, hyperlipidemia, and chronic systolic heart failure.  Patient was admitted in June 2019 with flash pulmonary edema.  He was ruled in for NSTEMI and underwent cardiac catheterization which showed severe three-vessel CAD. He underwent CABG by Dr Donata Clay with LIMA to LAD, SVG to diagonal, SVG to ramus intermediate, and SVG to RCA.  He had a syncope in June 2019 while waiting in the neurology office due to urethral stricture.  He was noted to have wide-complex tachycardia on telemetry with heart rate of 170.  EF was 35 to 40%.  Lower extremity Doppler was negative for DVT.  CT of the chest was negative for PE.  He was seen by EP, the underlying rhythm could be either VT versus NSVT.  He was fitted with LifeVest.  Medication was cut back due to mild hypotension and orthostasis.  Repeat echocardiogram in September 2019 showed persistently low EF of 25 to 30%.  He was unable to tolerate Entresto, spironolactone and carvedilol due to orthostatic symptom and the lightheadedness.  Patient eventually underwent BIV ICD implantation in March 2020 by Dr. Graciela Husbands after failing another trial of medical therapy.  Since then, repeat echocardiogram in July 2020 did show significant improvement in EF to 40 to 45%.  Was seen by EP on 08/14/20.  Noted persistent chest pain. Also notes increased dyspnea. Mild edema. Was given lasix which he has taken a couple of times. Myoview ordered. This showed EF drop to 17% with large inferolateral scar and anteroapical ischemia. Repeat Echo is showed  EF 40%.  Cardiac cath was done. This showed all grafts patent except for SVG to ramus which was occluded.  Right heart pressures, LV filling pressures and CO were normal. Recommended addition of SGLT 2 inhibitor. He did have lumbar surgery in September 2022.   He was  seen in the office on 06/20/2021 ordered a myriad of concerns including fatigue, lightheadedness, intermittent low heart rate, dyspnea on exertion, and multiple episodes of presyncope. Additionally, he reported bilateral lower extremity edema, right lower leg pain, and intermittent cramping in his calves with activity.  He was scheduled for outpatient cardiac catheterization for further evaluation. He, however, presented to the ED on 06/23/2021 with complaints of shortness of breath.  He was hospitalized from 6/19-6/21/2023 in the setting of acute heart failure exacerbation/CAD with anginal equivalent.  Troponin was mildly elevated. Chest x-ray showed mild CHF, small left pleural effusion. He received IV Lasix.  Lower extremity duplex was negative for DVT.  CTA of the chest was negative for PE.  He underwent cardiac catheterization on 06/24/2021 which revealed stable three-vessel CAD, known occluded SVG-RI, otherwise patent grafts, progression of proximal ramus intermedius lesion  to 80% with positive FFR s/p DES, RHC with relatively normal pressures.    Has arthritis in right knee. Did end up having right TKR in December. He is having a lot of pain with this. Also has noted a lot of fluctuation in his BP. His first PT session  was stopped due to low BP. LE venous doppler negative for DVT. CP ranges anywhere from 99 to 190 systolic.   He is still eating  healthy now and follows low sodium diet. He states he held his lasix the last couple of days due to frequent nocturia and it is difficult for him to get up from his surgery. He has chronic SOB. No increase edema and weight is down 5 lbs since October. No chest pain. On device check 2 days ago  Optivol/thoracic impedence was OK.   He was seen in the ED on 4/26 with URI and hypotension that responded to IV fluids. Labs and CXR OK. He  has been on antibiotics since then. He notes his BP does fluctuate. Sometimes will run low but then recover in 30 minutes. BP yesterday was 160 systolic. He feels well. No SOB or chest pain. No palpitations. Takes lasix about 3 times a month for swelling. When lying down at night he has discomfort at his ICD site. Improves with icing.    Past Medical History:  Diagnosis Date   AICD (automatic cardioverter/defibrillator) present    Anxiety    Arthritis    "mild; back, neck, spine" (06/09/2017)   CHF (congestive heart failure) (HCC)    Chronic back pain    Coronary artery disease    GERD (gastroesophageal reflux disease)    History of gout    History of kidney stones X 2   Hypercholesterolemia    Hypertension    LBBB (left bundle branch block)    MI (myocardial infarction) (HCC) 1993   LATERAL   MI (myocardial infarction) (HCC) ?09/2001; 06/07/2017   Pneumonia    "several times" (06/09/2017)   Pre-diabetes    S/P coronary artery stent placement    RCA   Varicose veins     Past Surgical History:  Procedure Laterality Date   ANTERIOR CERVICAL DECOMP/DISCECTOMY FUSION  03/03/2011   Procedure: ANTERIOR CERVICAL DECOMPRESSION/DISCECTOMY FUSION 3 LEVELS;  Surgeon: Karn Cassis, MD;  Location: [facility];  Service: Neurosurgery;  Laterality: N/A;  Cervical three-four Cervical four-five Cervical five-six Anterior cervical decompression/diskectomy, fusion   APPLICATION OF ROBOTIC ASSISTANCE FOR SPINAL PROCEDURE N/A 10/03/2020   Procedure: APPLICATION OF ROBOTIC ASSISTANCE FOR SPINAL PROCEDURE;  Surgeon: Lisbeth Renshaw, MD;  Location: [facility] OR;  Service: Neurosurgery;  Laterality: N/A;   BACK SURGERY     CARDIAC CATHETERIZATION  2009   Stents in RCA patent. 70 to 80% PL, and 50% ostial PD.    CARDIAC CATHETERIZATION N/A 11/20/2014   Procedure: Left  Heart Cath and Coronary Angiography;  Surgeon: Shlomie Romig M [place], MD;  Location: [facility] INVASIVE CV LAB;  Service: Cardiovascular;  Laterality: N/A;   CORONARY ANGIOPLASTY     DIRECT ANGIOPLASTY THE MARGINAL BRANCH   CORONARY ANGIOPLASTY WITH STENT PLACEMENT     RCA   CORONARY ARTERY BYPASS GRAFT N/A 06/14/2017   Procedure: CORONARY ARTERY BYPASS GRAFTING (CABG) x four, using left internal mammary artery and right leg greater saphenous vein harvested endoscopically;  Surgeon: Kerin Perna, MD;  Location: [facility] OR;  Service: Open Heart Surgery;  Laterality: N/A;   CORONARY PRESSURE/FFR STUDY N/A 06/24/2021   Procedure: INTRAVASCULAR PRESSURE WIRE/FFR STUDY;  Surgeon: Marykay Lex, MD;  Location: [facility] INVASIVE CV LAB;  Service: Cardiovascular;  Laterality: N/A;   CORONARY STENT INTERVENTION N/A 06/24/2021   Procedure: CORONARY STENT INTERVENTION;  Surgeon: Marykay Lex, MD;  Location: [facility] INVASIVE CV LAB;  Service: Cardiovascular;  Laterality: N/A;  CYSTOSCOPY N/A 06/14/2017   Procedure: CYSTOSCOPY;  Surgeon: Donata Clay, Theron Arista, MD;  Location: [facility] OR;  Service: Open Heart Surgery;  Laterality: N/A;   ICD IMPLANT N/A 03/11/2018   Procedure: ICD IMPLANT - Dual Chamber;  Surgeon: [name], MD;  Location: [facility] INVASIVE CV LAB;  Service: Cardiovascular;  Laterality: N/A;   INSERTION OF SUPRAPUBIC CATHETER N/A 06/14/2017   Procedure: INSERTION OF SUPRAPUBIC CATHETER - Lower abdomen;  Surgeon: Kerin Perna, MD;  Location: [facility] OR;  Service: Open Heart Surgery;  Laterality: N/A;   POSTERIOR LUMBAR FUSION  10/2011   RIGHT/LEFT HEART CATH AND CORONARY ANGIOGRAPHY N/A 06/08/2017   Procedure: RIGHT/LEFT HEART CATH AND CORONARY ANGIOGRAPHY;  Surgeon: [place], Ashten Sarnowski M, MD;  Location: [facility] INVASIVE CV LAB;  Service: Cardiovascular;  Laterality: N/A;   RIGHT/LEFT HEART CATH AND CORONARY/GRAFT ANGIOGRAPHY N/A 09/06/2020   Procedure: RIGHT/LEFT HEART CATH AND CORONARY/GRAFT ANGIOGRAPHY;  Surgeon: [place], Lorilyn Laitinen M, MD;   Location: [facility] INVASIVE CV LAB;  Service: Cardiovascular;  Laterality: N/A;   RIGHT/LEFT HEART CATH AND CORONARY/GRAFT ANGIOGRAPHY N/A 06/24/2021   Procedure: RIGHT/LEFT HEART CATH AND CORONARY/GRAFT ANGIOGRAPHY;  Surgeon: Marykay Lex, MD;  Location: [facility] INVASIVE CV LAB;  Service: Cardiovascular;  Laterality: N/A;   TEE WITHOUT CARDIOVERSION N/A 06/14/2017   Procedure: TRANSESOPHAGEAL ECHOCARDIOGRAM (TEE);  Surgeon: Donata Clay, Theron Arista, MD;  Location: [facility] OR;  Service: Open Heart Surgery;  Laterality: N/A;   TOTAL KNEE ARTHROPLASTY Right 12/17/2021   Procedure: TOTAL KNEE ARTHROPLASTY;  Surgeon: Eugenia Mcalpine, MD;  Location: [facility] ORS;  Service: Orthopedics;  Laterality: Right;  adductor canal  120   URETHROPLASTY N/A 10/15/2017   Procedure: URETHROPLASTY WITH BUCCAL GRAFT HARVAST;  Surgeon: Crist Fat, MD;  Location: [facility] ORS;  Service: Urology;  Laterality: N/A;    Current Medications: Current Meds  Medication Sig   acetaminophen (TYLENOL) 500 MG tablet Take 1,000 mg by mouth 3 (three) times daily.   Ascorbic Acid (SUPER C COMPLEX PO) Take 1 tablet by mouth 3 (three) times daily.   aspirin 81 MG EC tablet Take 81 mg by mouth in the morning.   carvedilol (COREG) 6.25 MG tablet Take 0.5 tablets (3.125 mg total) by mouth 2 (two) times daily.   cetirizine (ZYRTEC) 10 MG tablet Take 10 mg by mouth daily as needed for allergies.   furosemide (LASIX) 40 MG tablet Take 20 mg by mouth daily as needed for edema or fluid. Take 1/2 tablet ( 20 mg ) daily   Misc Natural Products (DEEP SLEEP) CAPS Take 2 capsules by mouth at bedtime.   Multiple Vitamins-Minerals (AIRBORNE GUMMIES PO) Take 3 capsules by mouth 2 (two) times daily.   nitroGLYCERIN (NITROSTAT) 0.4 MG SL tablet Place 1 tablet (0.4 mg total) under the tongue every 5 (five) minutes x 3 doses as needed for chest pain.   pantoprazole (PROTONIX) 40 MG tablet Take 1 tablet (40 mg total) by mouth daily.   REPATHA SURECLICK 140 MG/ML SOAJ INJECT  into THE SKIN EVERY 14 DAYS   sulfamethoxazole-trimethoprim (BACTRIM DS) 800-160 MG tablet Take 1 tablet by mouth 2 (two) times daily for 7 days.   valsartan (DIOVAN) 40 MG tablet Take 1 tablet (40 mg total) by mouth daily.   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  1987      Do you live in a house, apartment, assisted living, condo, trailer, etc.?  House      Is it one or more stories?  2 stories      How many persons live in your home?  2         Do you have any pets in your home?(please list)  No      Highest level of education completed:  11 Years      Current or past profession:  Sales-past      Do you exercise?:  YES    Type and how often:  Cardio and weights/At least everyday      Do you have a Living Will? (Form that indicates scenarios where you would not want your life prolonged)  YES      Do you have a DNR form?  YES       If not, would you like to discuss one?      Do you have signed POA/HPOA forms?  NO      Do you have difficulty bathing or dressing  yourself?  NO      Do you have difficulty preparing food or eating?  NO      Do you have difficulty managing medications?  NO      Do you have difficulty managing your finances?  NO      Do you have difficulty affording your medications?  NO                     Social Determinants of Health   Financial Resource Strain: Low Risk  (01/09/2021)   Overall Financial Resource Strain (CARDIA)    Difficulty of Paying Living Expenses: Not very hard  Food Insecurity: Food Insecurity Present (12/17/2021)   Hunger Vital Sign    Worried About Running Out of Food in the Last Year: Never true    Ran Out of Food in the Last Year: Sometimes true  Transportation Needs: No Transportation Needs (12/24/2021)   PRAPARE - Administrator, Civil Service (Medical): No    Lack of Transportation (Non-Medical): No  Physical Activity: Not on file  Stress: Not on file  Social Connections: Not on file     Family History: The patient's family history includes Breast cancer in his mother; Coronary artery disease in his mother; Diabetes in his father; Heart attack in his brother, father, and maternal grandfather; Heart disease in his brother; Stroke in his father.  ROS:  Please see the history of present illness.     All other systems reviewed and are negative.  EKGs/Labs/Other Studies Reviewed:    The following studies were reviewed today:  Echo 07/11/2018 1. The left ventricle has mild-moderately reduced systolic function, with an ejection fraction of 40-45%. The cavity size was normal. Left ventricular diastolic Doppler parameters are indeterminate. Indeterminate filling pressures.  2. The right ventricle has normal systolic function. The cavity was normal. There is no increase in right ventricular wall thickness. Right ventricular systolic pressure could not be assessed.  3. Left atrial size was mildly dilated.  4. Aortic valve regurgitation is mild to moderate by color flow Doppler. No  stenosis of the aortic valve.  5. The Ascending aorta measures 39 mm, within normal limits when indexed to body surface area.  6. When compared to the prior study: 01/21/2018- LV ejection fraction has improved. Ventricular septal contraction is less dysynchronous. Aortic valve regurgitation has not significantly changed. Side by side comparison of images performed.  Myoview 08/21/20: Study Highlights    Nuclear stress EF: 17%. The left ventricular ejection fraction is severely decreased (<30%). There was no ST segment deviation noted during stress. Defect 1: There is a large defect of severe severity present in the basal inferior, basal inferolateral, mid inferior, mid inferolateral and apical inferior location. Defect 2: There is a small defect of mild severity present in the apical anterior location. This is a high risk study. Findings consistent with ischemia and prior myocardial infarction.   High risk study due to severely decreased left ventricular function. There is a large (old) inferolateral scar. There is a small area of mild anteroapical ischemia. There has been a marked reduction in left ventricular systolic function since 2018.  Recommend correlation with echo and consider possible "balanced ischemia". Compared to ECG from 02/05/2020, there is a marked reduction in the amplitude of the initial R wave in V1, although the initial deflection is still positive. Visually, there appears to be substantial septal-lateral dyssynchrony. Consider reevaluation of CRT device settings.    Echo 08/30/20: IMPRESSIONS     1. Poor acoustic windows limit study. Definity used. LVEF is moderately  depressed with diffuse hypokinesis worse in the annteiror and lateral  walls. LVEF is approximately 40%. The left ventricle has moderately  decreased function. The left ventricular  internal cavity size was severely dilated. There is mild left ventricular  hypertrophy. Indeterminate diastolic filling due  to E-A fusion.   2. Right ventricular systolic function is normal. The right ventricular  size is normal.   3. Mild mitral valve regurgitation.   4. Aortic valve regurgitation is mild. Mild aortic valve sclerosis is  present, with no evidence of aortic valve stenosis.   5. Aortic dilatation noted. There is mild dilatation of the aortic root,  measuring 42 mm.   Comparison(s): The left ventricular function is unchanged.   Cardiac cath 09/06/20:  RIGHT/LEFT HEART CATH AND CORONARY/GRAFT ANGIOGRAPHY   Conclusion      Ost LM to Mid LM lesion is 40% stenosed.   Prox LAD to Mid LAD lesion is 100% stenosed.   Ramus lesion is 60% stenosed.   Prox RCA lesion is 90% stenosed.   Origin lesion is 100% stenosed.   SVG graft was visualized by angiography and is normal in caliber.   SVG graft was visualized by angiography.   SVG graft was visualized by angiography and is normal in caliber.   LIMA graft was visualized by angiography and is normal  in caliber.   The graft exhibits no disease.   The graft exhibits no disease.   The graft exhibits no disease.   LV end diastolic pressure is normal.   There is no aortic valve stenosis.   3 vessel CAD.  Patent LIMA to the LAD.  Patent SVG to the first diagonal Patent SVG to the PDA Occluded SVG to the Ramus intermediate Normal LV filling pressures Normal right heart pressures Normal cardiac output   Plan: recommend continued medical management. The lesion in the ramus intermediate is moderate - in some views appears <40% and other views about 60-65%. This is certainly not critical. Good flow down the native vessel is probably the reason for graft closure. His EF by Echo is in line with prior Echo in 2020. Based on current findings I cannot explain his dyspnea. Would consider outpatient sleep study and/or PFTs to consider other causes. We will add Jardiance for his CHF and DM. He does not need diuresis.    Echo 06/16/21: IMPRESSIONS     1. Left  ventricular ejection fraction, by estimation, is 30 to 35%. Left  ventricular ejection fraction by 3D volume is 31 %. The left ventricle has  moderately decreased function. The left ventricle demonstrates global  hypokinesis. The left ventricular  internal cavity size was moderately dilated. Left ventricular diastolic  parameters are consistent with Grade I diastolic dysfunction (impaired  relaxation).   2. Right ventricular systolic function is mildly reduced. The right  ventricular size is normal. There is normal pulmonary artery systolic  pressure. The estimated right ventricular systolic pressure is 33.2 mmHg.   3. Left atrial size was mild to moderately dilated.   4. The mitral valve is grossly normal. Mild mitral valve regurgitation.  No evidence of mitral stenosis.   5. The aortic valve is tricuspid. Aortic valve regurgitation is mild. No  aortic stenosis is present.   6. There is mild dilatation of the ascending aorta, measuring 41 mm.   7. The inferior vena cava is normal in size with greater than 50%  respiratory variability, suggesting right atrial pressure of 3 mmHg.   Comparison(s): Changes from prior study are noted. The left ventricular  function is worsened.    Cardiac cath 06/24/21: Procedures  CORONARY STENT INTERVENTION  INTRAVASCULAR PRESSURE WIRE/FFR STUDY  RIGHT/LEFT HEART CATH AND CORONARY/GRAFT ANGIOGRAPHY   Conclusion      -------------- NATIVE CORONARIES------------   Prox RCA lesion is 90% stenosed.  Dist RCA lesion is 90% stenosed.   Ost LM to Mid LM lesion is 40% stenosed.   Prox LAD lesion is 100% stenosed prior to D1. Prox LAD to Mid LAD lesion is 100% stenosed following D1.   CULPRIT LESION: Ramus lesion is 75% stenosed.  (Borderline positive by RFR (0.91) and FFR 0.80) -> given the patient's symptoms, this is felt to be significant.   A drug-eluting stent was successfully placed using a SYNERGY XD 3.0X16 -> deployed to 3.3 mm.   Post intervention,  there is a 0% residual stenosis.   -------------- GRAFTS -------------------------------   LIMA-LAD graft was visualized by angiography and is normal in caliber. The graft exhibits no disease. There is no competitive flow   SVG-D1 graft was visualized by angiography and is normal in caliber. The graft exhibits no disease, and the flow is not reversed.  There is retrograde filling of a major septal perforator trunk in the LAD that it would otherwise not be perfused by the native LAD or retrograde filling  from the LIMA..   SVG-rPDA graft was visualized by angiography and is moderate in size.  The graft exhibits no disease. There is trivial competitive flow   SVG-RI graft was visualized by angiography, Origin lesion is 100% stenosed.   -------------- HEMODYNAMICS-----------------   LV end diastolic pressure is normal.   There is no aortic valve stenosis.   POST-OPERATIVE DIAGNOSIS:   Stable Three-Vessel CAD: Ostial mid OM 40%.  Proximal to mid LAD 100%.  Proximal RCA 90% at bend with trace filling of the PDA and PL.  Patent LCx that is small and nondominant.   Culprit lesion: Progression of proximal Ramus Intermedius (RI) lesion from 60% to 70-80% -> RFR 0.90, 0.91 with a FFR of 0.80.  Based on his symptoms thought to be significant. Successful DES PCI of ostial and proximal L RI using Synergy DES 3.0 mm x 16 mm deployed to 3.3 mm. Reducing lesion from 70-80% to 0% TIMI-3 flow pre and post.   Known occluded SVG-RI with otherwise patent SVG-PDA, SVG-D1, and LIMA-LAD RHC pressures relatively normal:  RAP 2 mmHg, RVP-EDP 28/0-0 mmHg; PAP-mean 19/10-15 mmHg, PCWP 16 mmHg; LVP-EDP 120/1-12 mmHg,  AoP-MAP 116/54 mmHg - 72 mmHg; Ao sat 98%, PA sat 69% Cardiac Output-Index: Fick 5.32-2.33; TD 3.21-1.4 (suggestive of valvular disease.   PLAN OF CARE:  Return to nursing floor for post PCI care after sheath removal. ->  Dual and platelet therapy for 6 months, then okay to stop aspirin.  Seems relatively  euvolemic on right heart cath.     Bryan Lemma, MD  Right Heart  Right Heart Pressures PAP-mean 19/10-15 mmHg,  PCWP 16 mmHg;  LV EDP is normal. Minimally elevated: LVP-EDP 120/1-12 mmHg,  RHC pressures relatively normal:   AoP-MAP 116/54 mmHg - 72 mmHg;   Ao sat 98%, PA sat 69%  Cardiac Output-Index: Fick 5.32-2.33; TD 3.21-1.4 (suggestive of valvular disease.  Right Atrium Right atrial pressure is normal. RAP 2 mmHg,  Right Ventricle RVP-EDP 28/0-0 mmHg;  Coronary Diagrams  Diagnostic Dominance: Right  Intervention    EKG:  EKG is not ordered today.   Recent Labs: 06/23/2021: B Natriuretic Peptide 338.6 05/01/2022: ALT 30; BUN 18; Creatinine, Ser 0.90; Hemoglobin 13.5; Platelets 244; Potassium 4.3; Sodium 136  Recent Lipid Panel    Component Value Date/Time   CHOL 153 08/30/2020 0828   TRIG 168 (H) 08/30/2020 0828   HDL 54 08/30/2020 0828   CHOLHDL 2.8 08/30/2020 0828   CHOLHDL 2.6 10/29/2017 0939   VLDL 27 10/29/2017 0939   LDLCALC 71 08/30/2020 0828    Physical Exam:    VS:  BP (!) 142/80 (BP Location: Right Arm, Cuff Size: Normal)   Pulse 86   Ht 6\' 2"  (1.88 m)   Wt 225 lb 12.8 oz (102.4 kg)   SpO2 94%   BMI 28.99 kg/m     Wt Readings from Last 3 Encounters:  05/14/22 225 lb 12.8 oz (102.4 kg)  05/13/22 228 lb (103.4 kg)  05/01/22 225 lb (102.1 kg)     GEN:  Well nourished, well developed in no acute distress HEENT: Normal NECK: No JVD; No carotid bruits LYMPHATICS: No lymphadenopathy CARDIAC: RRR, no murmurs, rubs, gallops RESPIRATORY:  Clear to auscultation without rales, wheezing or rhonchi  ABDOMEN: Soft, non-tender, non-distended MUSCULOSKELETAL: tr edema; No deformity  SKIN: Warm and dry NEUROLOGIC:  Alert and oriented x 3 PSYCHIATRIC:  Normal affect   ASSESSMENT:    1. Chronic systolic heart failure (HCC)   2. LBBB (  left bundle branch block)   3. Coronary artery disease of native artery of native heart with stable angina pectoris  (HCC)   4. Hyperlipidemia LDL goal <70   5. S/P CABG x 4        PLAN:    In order of problems listed above:  CAD s/p CABG 2019: Abnormal Myoview with large inferolateral scar and anteroapical ischemia Aug 2022.  Cardiac cath showed patent grafts except SVG to ramus which was occluded. More recent admission in June 2023 with stenting of native ramus intermediate. Now on ASA only. No significant angina.  Hyperlipidemia: On Repatha. Statin intolerant. Needs to have lipid panel done. Will order.   Acute on Chronic combined systolic/diastolic  heart failure: Last EF by Echo was 40% and is s/p BiV ICD. On Coreg and valsartan at low doses. Entresto resulted in low BPs. Unable to afford Jardiance.  Right heart cath normal right heart and LV filling pressures. Normal cardiac output. Continue lasix PRN.   Status post biventricular ICD: Followed by EP.     GERD  6.   Lumbar disc disease. S/p surgery  7.  S/p right TKR  8.  Labile BP. Will continue current medication and monitor. If BP drops should hold Coreg and valsartan that day. If BP consistently high can take extra valsartan.    Medication Adjustments/Labs and Tests Ordered: Current medicines are reviewed at length with the patient today.  Concerns regarding medicines are outlined above.  Orders Placed This Encounter  Procedures   Lipid panel    No orders of the defined types were placed in this encounter.    Patient Instructions  Medication Instructions:  Continue same medications *If you need a refill on your cardiac medications before your next appointment, please call your pharmacy*   Lab Work: Fasting lipid panel   Testing/Procedures: None ordered   Follow-Up: At [facility], you and your health needs are our priority.  As part of our continuing mission to provide you with exceptional heart care, we have created designated Provider Care Teams.  These Care Teams include your primary Cardiologist  (physician) and Advanced Practice Providers (APPs -  Physician Assistants and Nurse Practitioners) who all work together to provide you with the care you need, when you need it.  We recommend signing up for the patient portal called "MyChart".  Sign up information is provided on this After Visit Summary.  MyChart is used to connect with patients for Virtual Visits (Telemedicine).  Patients are able to view lab/test results, encounter notes, upcoming appointments, etc.  Non-urgent messages can be sent to your provider as well.   To learn more about what you can do with MyChart, go to [email or web address].    Your next appointment:  6 months    Provider:  Dr.Josanna Hefel   Follow up in 6 months  Signed, Yeraldy Spike [place], MD  05/14/2022 12:20 PM    [facility]

## 2022-05-12 ENCOUNTER — Telehealth: Payer: Self-pay | Admitting: Adult Health

## 2022-05-12 ENCOUNTER — Other Ambulatory Visit: Payer: Self-pay | Admitting: Adult Health

## 2022-05-12 MED ORDER — SULFAMETHOXAZOLE-TRIMETHOPRIM 800-160 MG PO TABS: 1.0000 | ORAL_TABLET | Freq: Two times a day (BID) | ORAL | 0 refills | Status: AC

## 2022-05-12 NOTE — Telephone Encounter (Signed)
Spoke with patient and results discussed. Patient verbalized his understanding. Patient stated that he is still having symptoms. He is aware that medication was sent to pharmacy.

## 2022-05-12 NOTE — Telephone Encounter (Signed)
[name], [name] C, NP  You2 minutes ago (11:17 AM)    Urine culture collected on 04/29/22 showed ESBL E.coli. I have sent Bactrim DS twice a day X 7 days to pharmacy for treatment of UTI.

## 2022-05-13 ENCOUNTER — Ambulatory Visit (INDEPENDENT_AMBULATORY_CARE_PROVIDER_SITE_OTHER): Payer: PPO | Admitting: Family Medicine

## 2022-05-13 ENCOUNTER — Encounter: Payer: Self-pay | Admitting: Family Medicine

## 2022-05-13 VITALS — BP 156/92 | HR 78 | Temp 98.1°F | Ht 74.0 in | Wt 228.0 lb

## 2022-05-13 DIAGNOSIS — M25569 Pain in unspecified knee: Secondary | ICD-10-CM | POA: Diagnosis not present

## 2022-05-13 DIAGNOSIS — Z87440 Personal history of urinary (tract) infections: Secondary | ICD-10-CM

## 2022-05-13 DIAGNOSIS — G8929 Other chronic pain: Secondary | ICD-10-CM

## 2022-05-13 DIAGNOSIS — R5383 Other fatigue: Secondary | ICD-10-CM

## 2022-05-13 DIAGNOSIS — I5023 Acute on chronic systolic (congestive) heart failure: Secondary | ICD-10-CM

## 2022-05-13 MED ORDER — PANTOPRAZOLE SODIUM 40 MG PO TBEC: 40.0000 mg | DELAYED_RELEASE_TABLET | Freq: Every day | ORAL | 3 refills | Status: DC

## 2022-05-13 NOTE — Progress Notes (Signed)
Provider:  Jacalyn Lefevre, MD  Careteam: Patient Care Team: Frederica Kuster, MD as PCP - General (Family Medicine) [place], Peter M, MD as PCP - Cardiology (Cardiology) [name], MD as PCP - Electrophysiology (Cardiology) Verner Chol, [facility] (Inactive) as Pharmacist (Pharmacist)  PLACE OF SERVICE:  [facility]  Advanced Directive information    Allergies  Allergen Reactions   Ezetimibe Other (See Comments)    Joint pain and drops BP   Niacin Other (See Comments)    Drops BP and severe joint/muscle pain   Statins Other (See Comments)    Severe joint pain and drops BP.    Chief Complaint  Patient presents with   Medical Management of Chronic Issues    Patient presents today for a 11 month follow-up   Quality Metric Gaps    AWV, zoster, COVID#4     HPI: Patient is a 66 y.o. male patient was seen several weeks ago and treated for sinus infection as well as possible kidney infection.  In the interim then he was seen in the emergency room for near syncope and hypotension which has been an ongoing issue and he is followed by cardiology.  He does have an implantable defibrillator but a fairly low ejection fraction of 16 to 17%. Urine culture grew out E. coli that is resistant to most oral medicines.  He had been given first Keflex and doxycycline in the ER.  Turns out sensitivities show sensitive to Augmentin as well as Macrobid and Bactrim or Septra.  He started on the Bactrim yesterday.  Reviewing sensitivities Augmentin may have been a better choice but since he has already started on the Bactrim I have asked him to continue that for 1 week.  If symptoms are improved I would continue for another week if symptoms are not changed or if he gets worse in the interim I will go ahead and switch him to Augmentin Respiratory symptoms have mostly resolved at this point  Review of Systems:  Review of Systems  Constitutional:  Positive for malaise/fatigue.  HENT: Negative.     Respiratory: Negative.    Cardiovascular: Negative.   Genitourinary:  Positive for frequency.  Musculoskeletal: Negative.   Neurological: Negative.   Psychiatric/Behavioral: Negative.    All other systems reviewed and are negative.   Past Medical History:  Diagnosis Date   AICD (automatic cardioverter/defibrillator) present    Anxiety    Arthritis    "mild; back, neck, spine" (06/09/2017)   CHF (congestive heart failure) (HCC)    Chronic back pain    Coronary artery disease    GERD (gastroesophageal reflux disease)    History of gout    History of kidney stones X 2   Hypercholesterolemia    Hypertension    LBBB (left bundle branch block)    MI (myocardial infarction) (HCC) 1993   LATERAL   MI (myocardial infarction) (HCC) ?09/2001; 06/07/2017   Pneumonia    "several times" (06/09/2017)   Pre-diabetes    S/P coronary artery stent placement    RCA   Varicose veins    Past Surgical History:  Procedure Laterality Date   ANTERIOR CERVICAL DECOMP/DISCECTOMY FUSION  03/03/2011   Procedure: ANTERIOR CERVICAL DECOMPRESSION/DISCECTOMY FUSION 3 LEVELS;  Surgeon: Karn Cassis, MD;  Location: [facility];  Service: Neurosurgery;  Laterality: N/A;  Cervical three-four Cervical four-five Cervical five-six Anterior cervical decompression/diskectomy, fusion   APPLICATION OF ROBOTIC ASSISTANCE FOR SPINAL PROCEDURE N/A 10/03/2020   Procedure: APPLICATION OF  ROBOTIC ASSISTANCE FOR SPINAL PROCEDURE;  Surgeon: Lisbeth Renshaw, MD;  Location: [facility] OR;  Service: Neurosurgery;  Laterality: N/A;   BACK SURGERY     CARDIAC CATHETERIZATION  2009   Stents in RCA patent. 70 to 80% PL, and 50% ostial PD.    CARDIAC CATHETERIZATION N/A 11/20/2014   Procedure: Left Heart Cath and Coronary Angiography;  Surgeon: Peter M Swaziland, MD;  Location: [facility] INVASIVE CV LAB;  Service: Cardiovascular;  Laterality: N/A;   CORONARY ANGIOPLASTY     DIRECT ANGIOPLASTY THE MARGINAL BRANCH   CORONARY ANGIOPLASTY WITH  STENT PLACEMENT     RCA   CORONARY ARTERY BYPASS GRAFT N/A 06/14/2017   Procedure: CORONARY ARTERY BYPASS GRAFTING (CABG) x four, using left internal mammary artery and right leg greater saphenous vein harvested endoscopically;  Surgeon: Kerin Perna, MD;  Location: [facility] OR;  Service: Open Heart Surgery;  Laterality: N/A;   CORONARY PRESSURE/FFR STUDY N/A 06/24/2021   Procedure: INTRAVASCULAR PRESSURE WIRE/FFR STUDY;  Surgeon: Marykay Lex, MD;  Location: [facility] INVASIVE CV LAB;  Service: Cardiovascular;  Laterality: N/A;   CORONARY STENT INTERVENTION N/A 06/24/2021   Procedure: CORONARY STENT INTERVENTION;  Surgeon: Marykay Lex, MD;  Location: [facility] INVASIVE CV LAB;  Service: Cardiovascular;  Laterality: N/A;   CYSTOSCOPY N/A 06/14/2017   Procedure: CYSTOSCOPY;  Surgeon: Kerin Perna, MD;  [facility] OR;  Service: Open Heart Surgery;  Laterality: N/A;   ICD IMPLANT N/A 03/11/2018   Procedure: ICD IMPLANT - Dual Chamber;  Surgeon: [name], MD;  Location: [facility] INVASIVE CV LAB;  Service: Cardiovascular;  Laterality: N/A;   INSERTION OF SUPRAPUBIC CATHETER N/A 06/14/2017   Procedure: INSERTION OF SUPRAPUBIC CATHETER - Lower abdomen;  Surgeon: Kerin Perna, MD;  Location: [facility] OR;  Service: Open Heart Surgery;  Laterality: N/A;   POSTERIOR LUMBAR FUSION  10/2011   RIGHT/LEFT HEART CATH AND CORONARY ANGIOGRAPHY N/A 06/08/2017   Procedure: RIGHT/LEFT HEART CATH AND CORONARY ANGIOGRAPHY;  Surgeon: Swaziland, Peter M, MD;  Location: [facility] INVASIVE CV LAB;  Service: Cardiovascular;  Laterality: N/A;   RIGHT/LEFT HEART CATH AND CORONARY/GRAFT ANGIOGRAPHY N/A 09/06/2020   Procedure: RIGHT/LEFT HEART CATH AND CORONARY/GRAFT ANGIOGRAPHY;  Surgeon: Swaziland, Peter M, MD;  Location: [facility] INVASIVE CV LAB;  Service: Cardiovascular;  Laterality: N/A;   RIGHT/LEFT HEART CATH AND CORONARY/GRAFT ANGIOGRAPHY N/A 06/24/2021   Procedure: RIGHT/LEFT HEART CATH AND CORONARY/GRAFT ANGIOGRAPHY;  Surgeon: Marykay Lex, MD;   Location: [facility] INVASIVE CV LAB;  Service: Cardiovascular;  Laterality: N/A;   TEE WITHOUT CARDIOVERSION N/A 06/14/2017   Procedure: TRANSESOPHAGEAL ECHOCARDIOGRAM (TEE);  Surgeon: Donata Clay, Theron Arista, MD;  Location: [facility];  Service: Open Heart Surgery;  Laterality: N/A;   TOTAL KNEE ARTHROPLASTY Right 12/17/2021   Procedure: TOTAL KNEE ARTHROPLASTY;  Surgeon: Eugenia Mcalpine, MD;  Location: [facility] ORS;  Service: Orthopedics;  Laterality: Right;  adductor canal  120   URETHROPLASTY N/A 10/15/2017   Procedure: URETHROPLASTY WITH BUCCAL GRAFT HARVAST;  Surgeon: Crist Fat, MD;  Location: [facility] ORS;  Service: Urology;  Laterality: N/A;   Social History:   reports that he quit smoking about 23 years ago. His smoking use included cigarettes. He has a 40.00 pack-year smoking history. He has never used smokeless tobacco. He reports that he does not currently use alcohol. He reports that he does not use drugs.  Family History  Problem Relation Age of Onset   Breast cancer Mother    Coronary artery disease Mother    Stroke  Father    Heart attack Father    Diabetes Father    Heart attack Brother    Heart disease Brother    Heart attack Maternal Grandfather     Medications: Patient's Medications  New Prescriptions   No medications on file  Previous Medications   ACETAMINOPHEN (TYLENOL) 500 MG TABLET    Take 1,000 mg by mouth 3 (three) times daily.   ASCORBIC ACID (SUPER C COMPLEX PO)    Take 1 tablet by mouth 3 (three) times daily.   ASPIRIN 81 MG EC TABLET    Take 81 mg by mouth in the morning.   CARVEDILOL (COREG) 6.25 MG TABLET    Take 0.5 tablets (3.125 mg total) by mouth 2 (two) times daily.   CETIRIZINE (ZYRTEC) 10 MG TABLET    Take 10 mg by mouth daily as needed for allergies.   DEXTROMETHORPHAN-GUAIFENESIN (MUCINEX DM) 30-600 MG 12HR TABLET    Take 1 tablet by mouth 2 (two) times daily for 14 days.   FUROSEMIDE (LASIX) 40 MG TABLET    Take 20 mg by mouth daily as needed for edema or fluid.  Take 1/2 tablet ( 20 mg ) daily   METHOCARBAMOL (ROBAXIN) 500 MG TABLET    Take 1 tablet (500 mg total) by mouth every 6 (six) hours as needed for muscle spasms.   MISC NATURAL PRODUCTS (DEEP SLEEP) CAPS    Take 2 capsules by mouth at bedtime.   MULTIPLE VITAMINS-MINERALS (AIRBORNE GUMMIES PO)    Take 3 capsules by mouth 2 (two) times daily.   NITROGLYCERIN (NITROSTAT) 0.4 MG SL TABLET    Place 1 tablet (0.4 mg total) under the tongue every 5 (five) minutes x 3 doses as needed for chest pain.   REPATHA SURECLICK 140 MG/ML SOAJ    INJECT into THE SKIN EVERY 14 DAYS   SULFAMETHOXAZOLE-TRIMETHOPRIM (BACTRIM DS) 800-160 MG TABLET    Take 1 tablet by mouth 2 (two) times daily for 7 days.   VALSARTAN (DIOVAN) 40 MG TABLET    Take 1 tablet (40 mg total) by mouth daily.   ZOLPIDEM (AMBIEN) 10 MG TABLET    TAKE 1 TABLET BY MOUTH AT BEDTIME  Modified Medications   Modified Medication Previous Medication   PANTOPRAZOLE (PROTONIX) 40 MG TABLET pantoprazole (PROTONIX) 40 MG tablet      Take 1 tablet (40 mg total) by mouth daily.    TAKE 1 TABLET BY MOUTH EVERY DAY  Discontinued Medications   BISACODYL (DULCOLAX) 5 MG EC TABLET    Take 1 tablet (5 mg total) by mouth daily as needed for moderate constipation.   ESOMEPRAZOLE (NEXIUM) 20 MG CAPSULE    Take 20 mg by mouth daily at 12 noon.   GABAPENTIN (NEURONTIN) 300 MG CAPSULE    Take 1 capsule by mouth at bedtime.   ONDANSETRON (ZOFRAN) 4 MG TABLET    Take 1 tablet (4 mg total) by mouth every 6 (six) hours as needed for nausea.    Physical Exam:  Vitals:   05/13/22 1006  BP: (!) 156/92  Pulse: 78  Temp: 98.1 F (36.7 C)  SpO2: 97%  Weight: 228 lb (103.4 kg)  Height: 6\' 2"  (1.88 m)   Body mass index is 29.27 kg/m. Wt Readings from Last 3 Encounters:  05/13/22 228 lb (103.4 kg)  05/01/22 225 lb (102.1 kg)  04/29/22 227 lb 12.8 oz (103.3 kg)    Physical Exam Vitals and nursing note reviewed.  Constitutional:  Appearance: Normal  appearance.  HENT:     Head:     Comments: Sinuses nontender Cardiovascular:     Rate and Rhythm: Normal rate and regular rhythm.  Pulmonary:     Effort: Pulmonary effort is normal.     Breath sounds: Normal breath sounds.  Musculoskeletal:        General: Normal range of motion.  Skin:    General: Skin is warm and dry.  Neurological:     General: No focal deficit present.     Mental Status: He is alert and oriented to person, place, and time.     Labs reviewed: Basic Metabolic Panel: Recent Labs    12/17/21 1647 12/18/21 0340 05/01/22 1615  NA 139 139 136  K 4.1 5.1 4.3  CL 104 102 98  CO2 27 28 27   GLUCOSE 159* 145* 100*  BUN 20 24* 18  CREATININE 0.95 1.11 0.90  CALCIUM 8.5* 9.0 9.1   Liver Function Tests: Recent Labs    06/23/21 1408 12/08/21 1039 05/01/22 1615  AST 25 20 23   ALT 18 22 30   ALKPHOS 50 49 68  BILITOT 1.5* 0.9 0.8  PROT 6.4* 6.8 6.7  ALBUMIN 3.7 3.8 3.7   Recent Labs    05/15/21 1545 05/01/22 1615  LIPASE 12 26   No results for input(s): "AMMONIA" in the last 8760 hours. CBC: Recent Labs    05/15/21 1545 06/23/21 1408 06/24/21 0151 12/17/21 1647 12/18/21 0340 05/01/22 1615  WBC 9.4 8.4   < > 8.2 12.9* 7.3  NEUTROABS 7.7 6.5  --   --   --   --   HGB 13.6 15.0   < > 13.3 13.1 13.5  HCT 42.7 45.0   < > 40.9 40.0 41.4  MCV 97.0 95.1   < > 101.0* 99.8 100.0  PLT 217 209   < > 177 222 244   < > = values in this interval not displayed.   Lipid Panel: No results for input(s): "CHOL", "HDL", "LDLCALC", "TRIG", "CHOLHDL", "LDLDIRECT" in the last 8760 hours. TSH: No results for input(s): "TSH" in the last 8760 hours. A1C: Lab Results  Component Value Date   HGBA1C 6.4 (H) 12/08/2021     Assessment/Plan  1. Acute on chronic systolic CHF (congestive heart failure), NYHA class 3 (HCC) Patient monitored closely by cardiology.  Has a internal defibrillator.  He is on 2 different low-dose antihypertensives  2. Fatigue,  unspecified type Likely related to the low cardiac output.  He is trying to do some exercise on a  3. History of urinary tract infection Recurring urinary tract symptoms.  Really suggestive of prostate infection but recent culture shows E. coli which would be unusual for prostate  4. Chronic knee pain, unspecified laterality Had knee replacement per Dr. Thomasena Edis several months ago.  Walking has improved but still has some swelling and pain in the involved knee   Jacalyn Lefevre, MD [facility] [phone]

## 2022-05-14 ENCOUNTER — Other Ambulatory Visit: Payer: Self-pay | Admitting: Family Medicine

## 2022-05-14 ENCOUNTER — Encounter: Payer: Self-pay | Admitting: Cardiology

## 2022-05-14 ENCOUNTER — Ambulatory Visit: Payer: PPO | Attending: Cardiology | Admitting: Cardiology

## 2022-05-14 VITALS — BP 142/80 | HR 86 | Ht 74.0 in | Wt 225.8 lb

## 2022-05-14 DIAGNOSIS — I5022 Chronic systolic (congestive) heart failure: Secondary | ICD-10-CM

## 2022-05-14 DIAGNOSIS — Z951 Presence of aortocoronary bypass graft: Secondary | ICD-10-CM | POA: Diagnosis not present

## 2022-05-14 DIAGNOSIS — E785 Hyperlipidemia, unspecified: Secondary | ICD-10-CM | POA: Diagnosis not present

## 2022-05-14 DIAGNOSIS — I25118 Atherosclerotic heart disease of native coronary artery with other forms of angina pectoris: Secondary | ICD-10-CM | POA: Diagnosis not present

## 2022-05-14 DIAGNOSIS — I447 Left bundle-branch block, unspecified: Secondary | ICD-10-CM | POA: Diagnosis not present

## 2022-05-14 NOTE — Patient Instructions (Signed)
Medication Instructions:  Continue same medications *If you need a refill on your cardiac medications before your next appointment, please call your pharmacy*   Lab Work: Fasting lipid panel   Testing/Procedures: None ordered   Follow-Up: At [facility], you and your health needs are our priority.  As part of our continuing mission to provide you with exceptional heart care, we have created designated Provider Care Teams.  These Care Teams include your primary Cardiologist (physician) and Advanced Practice Providers (APPs -  Physician Assistants and Nurse Practitioners) who all work together to provide you with the care you need, when you need it.  We recommend signing up for the patient portal called "MyChart".  Sign up information is provided on this After Visit Summary.  MyChart is used to connect with patients for Virtual Visits (Telemedicine).  Patients are able to view lab/test results, encounter notes, upcoming appointments, etc.  Non-urgent messages can be sent to your provider as well.   To learn more about what you can do with MyChart, go to [email or web address].    Your next appointment:  6 months    Provider:  Dr.Jordan

## 2022-05-18 DIAGNOSIS — I25118 Atherosclerotic heart disease of native coronary artery with other forms of angina pectoris: Secondary | ICD-10-CM | POA: Diagnosis not present

## 2022-05-18 DIAGNOSIS — I447 Left bundle-branch block, unspecified: Secondary | ICD-10-CM | POA: Diagnosis not present

## 2022-05-18 DIAGNOSIS — Z951 Presence of aortocoronary bypass graft: Secondary | ICD-10-CM | POA: Diagnosis not present

## 2022-05-18 DIAGNOSIS — E785 Hyperlipidemia, unspecified: Secondary | ICD-10-CM | POA: Diagnosis not present

## 2022-05-18 DIAGNOSIS — I5022 Chronic systolic (congestive) heart failure: Secondary | ICD-10-CM | POA: Diagnosis not present

## 2022-05-19 LAB — LIPID PANEL
Chol/HDL Ratio: 3.4 ratio (ref 0.0–5.0)
Cholesterol, Total: 154 mg/dL (ref 100–199)
HDL: 45 mg/dL (ref >39)
LDL Chol Calc (NIH): 75 mg/dL (ref 0–99)
Triglycerides: 207 mg/dL — ABNORMAL HIGH (ref 0–149)
VLDL Cholesterol Cal: 34 mg/dL (ref 5–40)

## 2022-05-27 ENCOUNTER — Other Ambulatory Visit: Payer: Self-pay | Admitting: Cardiology

## 2022-05-29 ENCOUNTER — Other Ambulatory Visit (HOSPITAL_COMMUNITY): Payer: Self-pay | Admitting: Cardiology

## 2022-06-02 ENCOUNTER — Encounter: Payer: Self-pay | Admitting: *Deleted

## 2022-06-02 NOTE — Progress Notes (Signed)
[facility] Quality Team Note  Name: [name] Date of Birth: Feb 25, 1955 MRN: [id number] Date: 06/02/2022  [facility] Quality Team has reviewed this patient's chart, please see recommendations below:  [facility] Quality Other; Pt has open gaps for AWV, diabetic eye screening, and A1C.  Would need completed before the end of 2024 to help close gaps.

## 2022-06-05 ENCOUNTER — Telehealth: Payer: Self-pay

## 2022-06-05 NOTE — Telephone Encounter (Signed)
Pt needs a new handheld. I gave him the number to [employer] tech support to get one ordered.

## 2022-06-10 ENCOUNTER — Ambulatory Visit (INDEPENDENT_AMBULATORY_CARE_PROVIDER_SITE_OTHER): Payer: PPO

## 2022-06-10 DIAGNOSIS — I447 Left bundle-branch block, unspecified: Secondary | ICD-10-CM

## 2022-06-10 LAB — CUP PACEART REMOTE DEVICE CHECK
Battery Remaining Longevity: 9 mo
Battery Voltage: 2.86 V
Brady Statistic AP VP Percent: 0.07 %
Brady Statistic AP VS Percent: 0.02 %
Brady Statistic AS VP Percent: 98.84 %
Brady Statistic AS VS Percent: 1.07 %
Brady Statistic RA Percent Paced: 0.09 %
Brady Statistic RV Percent Paced: 97.05 %
Date Time Interrogation Session: 20240605133135
HighPow Impedance: 80 Ohm
Implantable Lead Connection Status: 753985
Implantable Lead Connection Status: 753985
Implantable Lead Connection Status: 753985
Implantable Lead Implant Date: 20190610
Implantable Lead Implant Date: 20200306
Implantable Lead Implant Date: 20200306
Implantable Lead Location: 753858
Implantable Lead Location: 753859
Implantable Lead Location: 753860
Implantable Lead Model: 5071
Implantable Lead Model: 5076
Implantable Pulse Generator Implant Date: 20200306
Lead Channel Impedance Value: 323 Ohm
Lead Channel Impedance Value: 399 Ohm
Lead Channel Impedance Value: 399 Ohm
Lead Channel Impedance Value: 4047 Ohm
Lead Channel Impedance Value: 4047 Ohm
Lead Channel Impedance Value: 513 Ohm
Lead Channel Pacing Threshold Amplitude: 0.625 V
Lead Channel Pacing Threshold Amplitude: 0.625 V
Lead Channel Pacing Threshold Amplitude: 1.875 V
Lead Channel Pacing Threshold Pulse Width: 0.4 ms
Lead Channel Pacing Threshold Pulse Width: 0.4 ms
Lead Channel Pacing Threshold Pulse Width: 1 ms
Lead Channel Sensing Intrinsic Amplitude: 3.875 mV
Lead Channel Sensing Intrinsic Amplitude: 3.875 mV
Lead Channel Sensing Intrinsic Amplitude: 31.625 mV
Lead Channel Sensing Intrinsic Amplitude: 31.625 mV
Lead Channel Setting Pacing Amplitude: 1.5 V
Lead Channel Setting Pacing Amplitude: 2.5 V
Lead Channel Setting Pacing Amplitude: 3 V
Lead Channel Setting Pacing Pulse Width: 0.4 ms
Lead Channel Setting Pacing Pulse Width: 1 ms
Lead Channel Setting Sensing Sensitivity: 0.6 mV
Zone Setting Status: 755011
Zone Setting Status: 755011

## 2022-06-29 IMAGING — DX DG CHEST 1V PORT
1 series · 1 of 1 positions shown · non-contrast
Comparison: May 15, 2021

CLINICAL DATA: Shortness of breath

EXAM:
PORTABLE CHEST 1 VIEW

[chest]
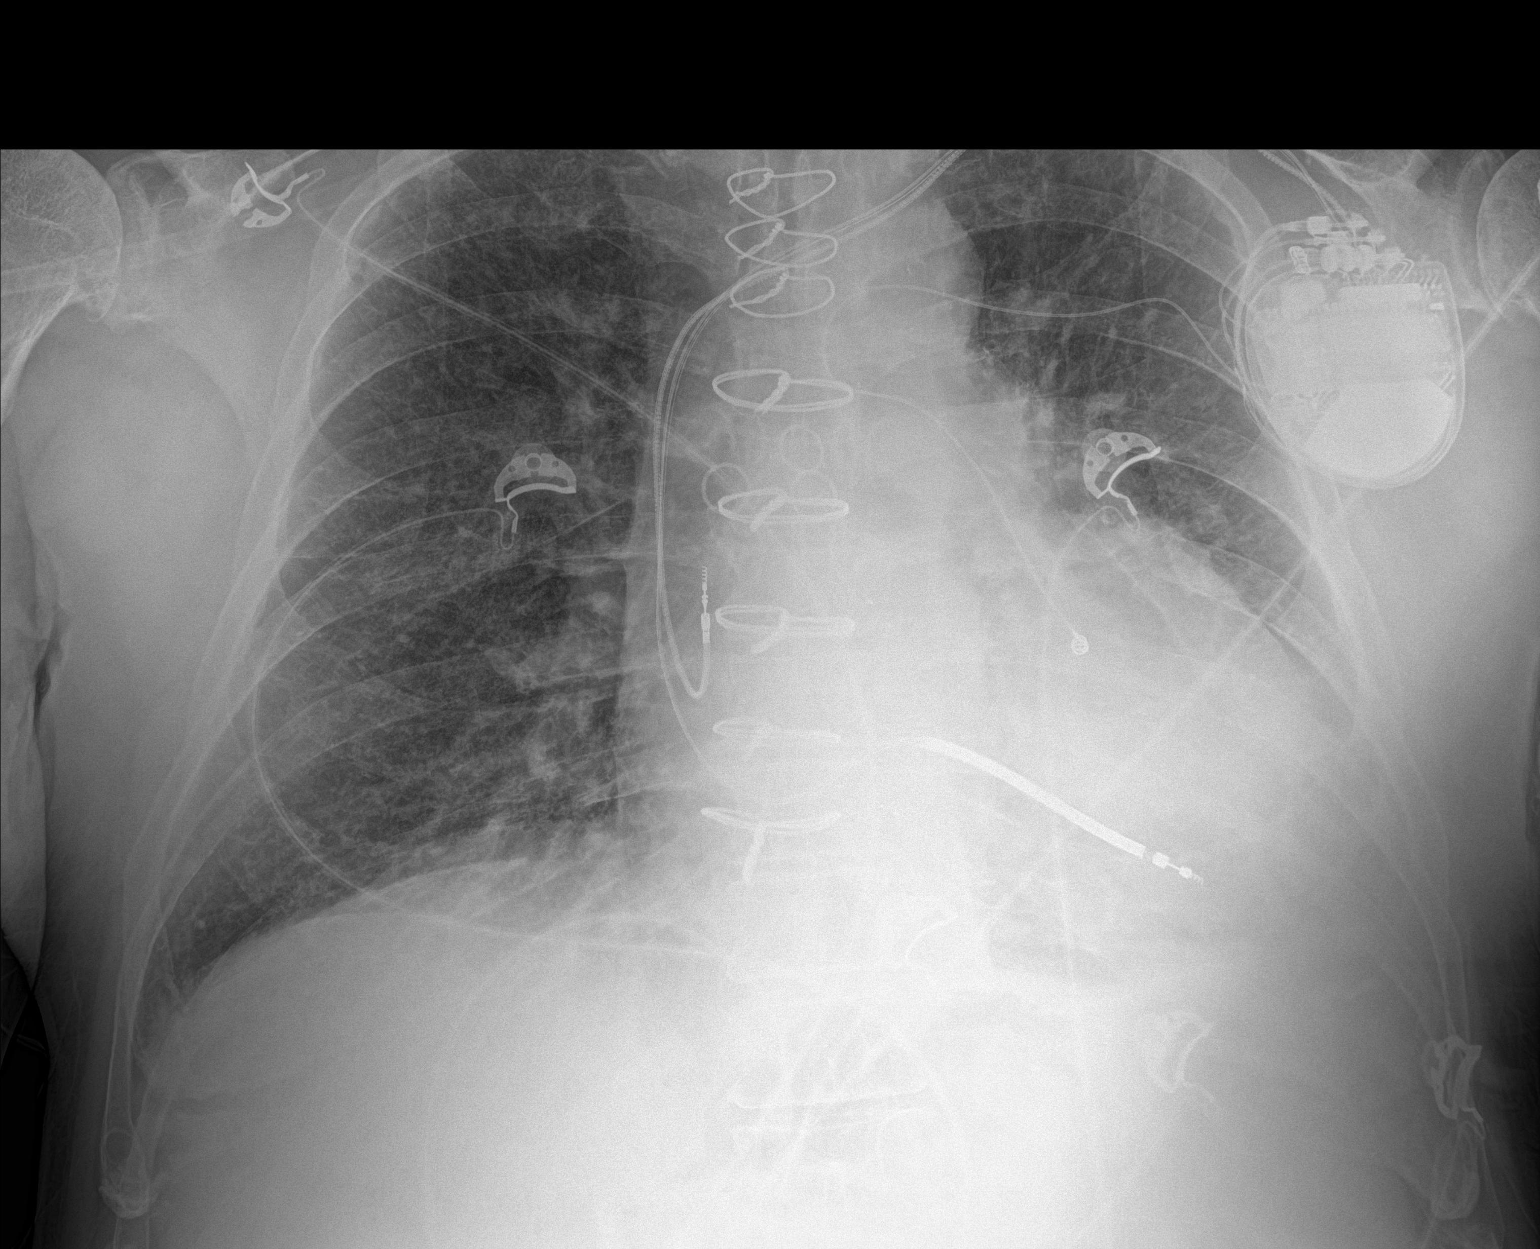

[1 of 1 positions shown; findings below may reference images not displayed]

FINDINGS: The heart size and mediastinal contours are stable. The heart size
is enlarged. Cardiac pacemaker and pacer leads are unchanged. Mild
increased pulmonary interstitium is identified bilaterally. Small
left pleural effusion is identified. Patchy opacity of the medial
left lung base is noted. The visualized skeletal structures are
stable.
IMPRESSION: 1. Mild congestive heart failure.
2. Small left pleural effusion.
3. Patchy opacity of the medial left lung base, superimposed
pneumonia is not excluded.

## 2022-06-29 IMAGING — CT CT ANGIO CHEST
2 of 6 series · 17 of 46 positions shown · IV contrast (agent unspecified)
Comparison: CT a chest, 05/15/2021.

CLINICAL DATA: Short of breath. History of previous myocardial
infarctions and CABG surgery. No current chest pain.

EXAM:
CT ANGIOGRAPHY CHEST WITH CONTRAST
TECHNIQUE: Multidetector CT imaging of the chest was performed using the
standard protocol during bolus administration of intravenous
contrast. Multiplanar CT image reconstructions and MIPs were
obtained to evaluate the vascular anatomy.

[Series 7: thins · axial · 0.73mm/px · z∈[-1,+251]mm · 14 of 396 slices shown]
[im 18/396  lung]
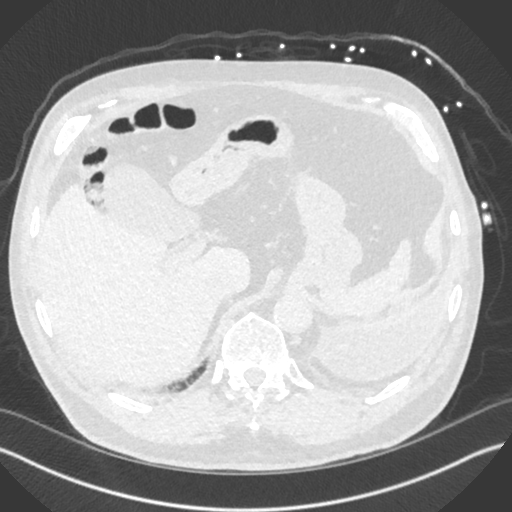
[im 52/396  soft-tissue]
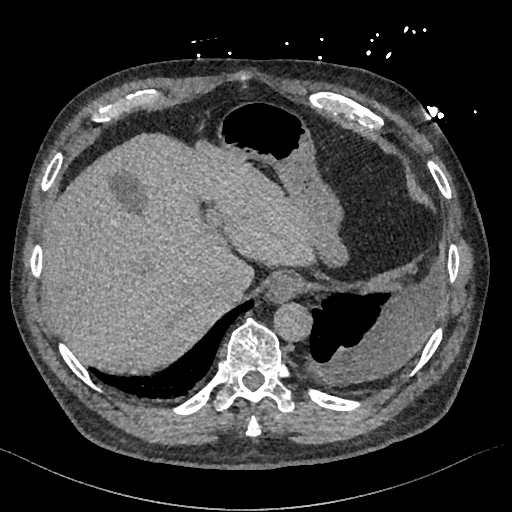
[im 69/396  lung]
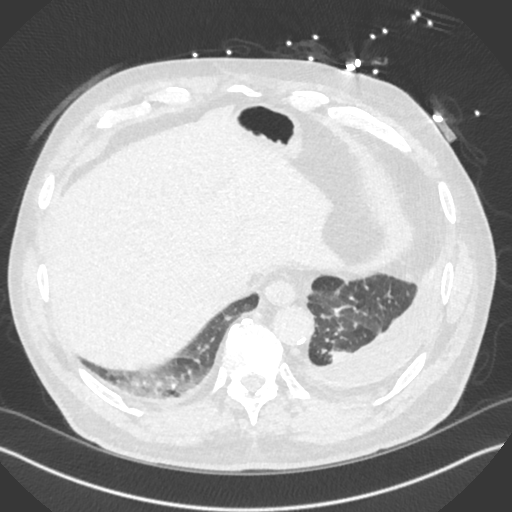
[im 104/396  soft-tissue]
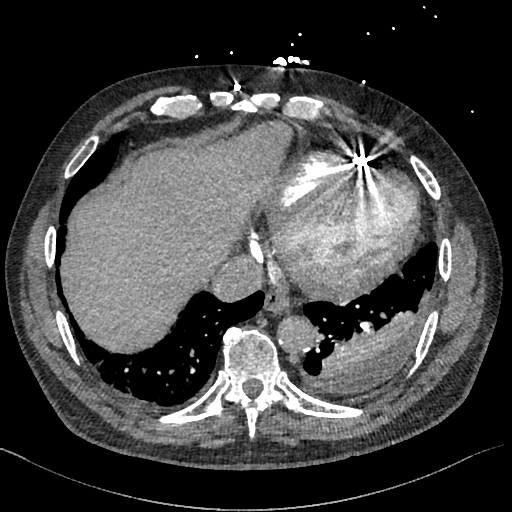
[im 138/396  lung]
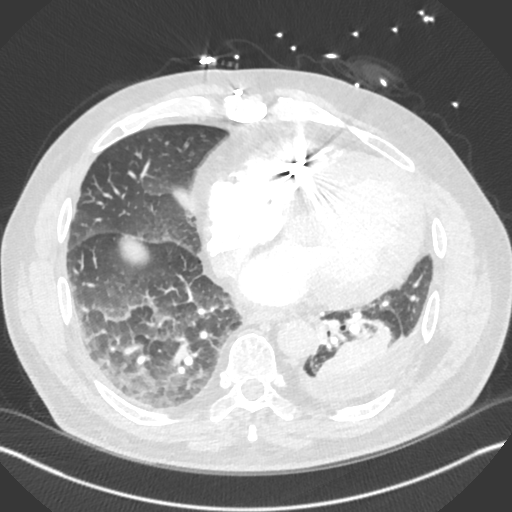
[im 155/396  soft-tissue]
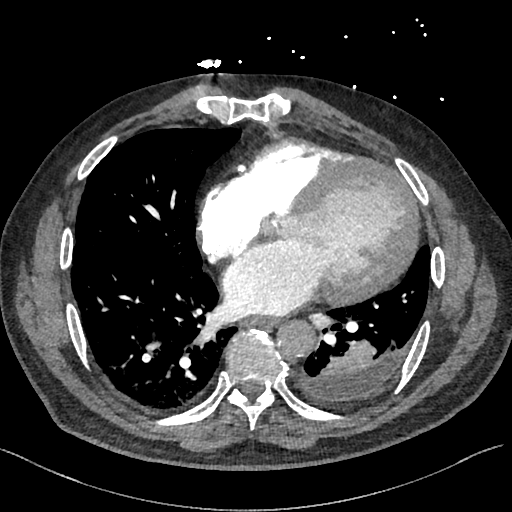
[im 189/396  lung]
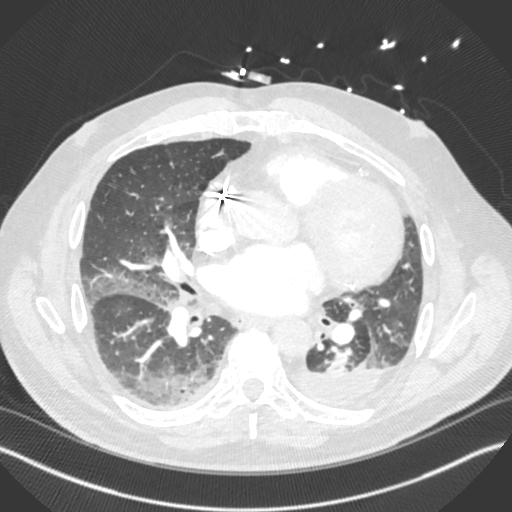
[im 207/396  soft-tissue]
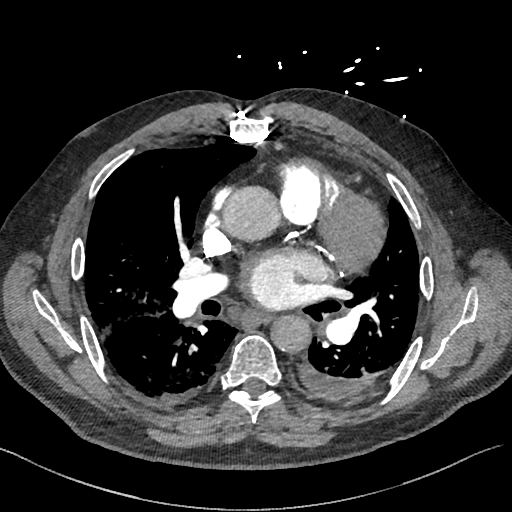
[im 241/396  lung]
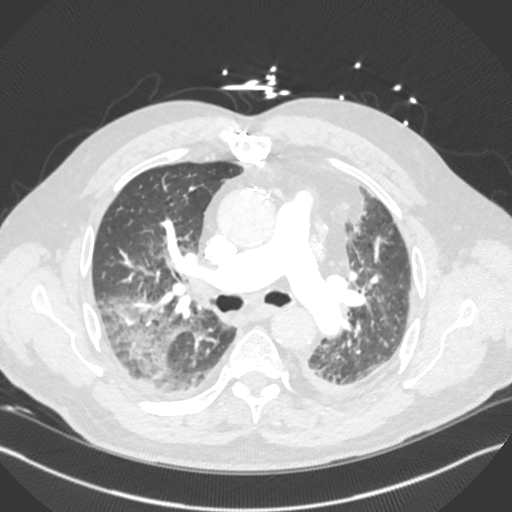
[im 258/396  soft-tissue]
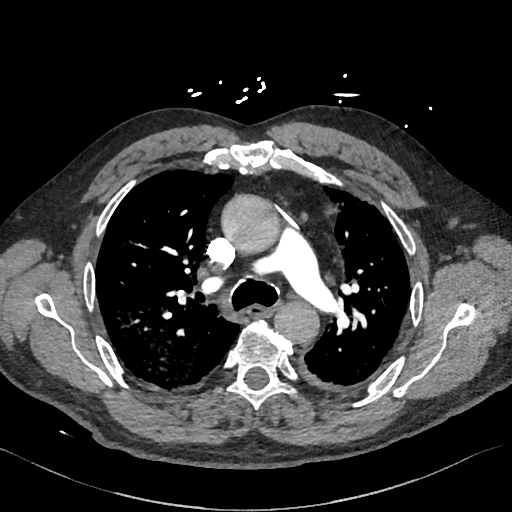
[im 292/396  lung]
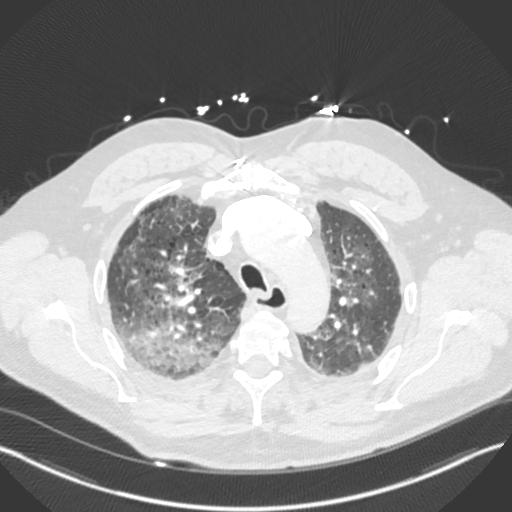
[im 327/396  soft-tissue]
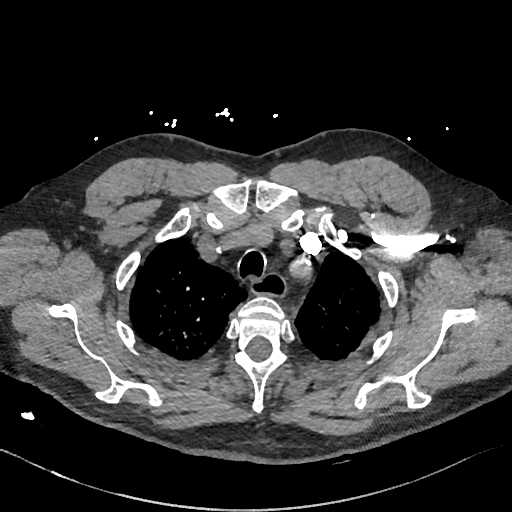
[im 344/396  lung]
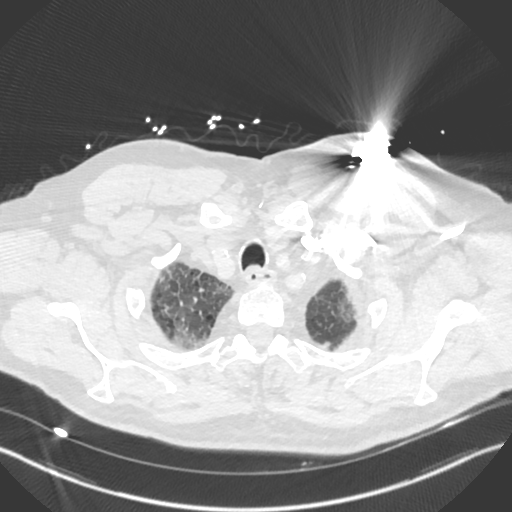
[im 378/396  soft-tissue]
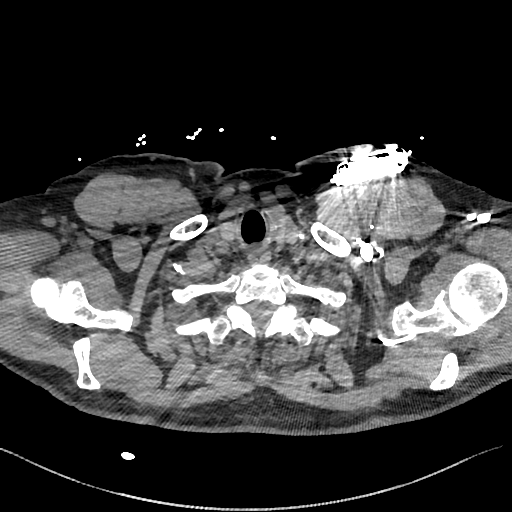

[Series 8: cor · coronal · 0.54mm/px · 3 of 153 slices shown]
[im 39/153  soft-tissue]
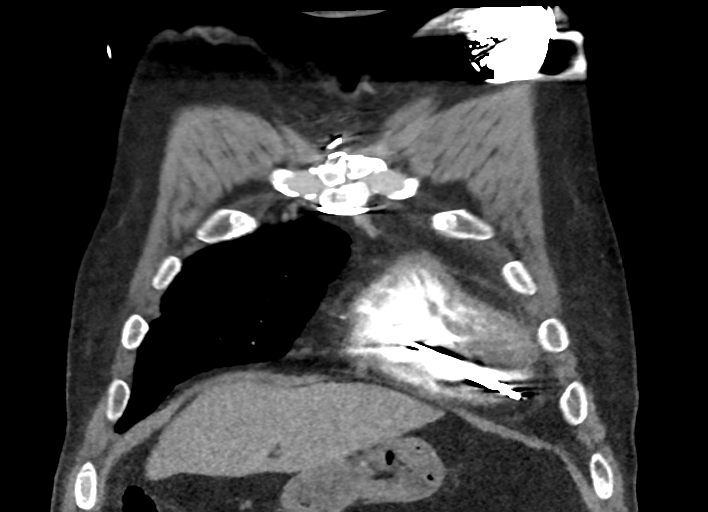
[im 77/153  soft-tissue]
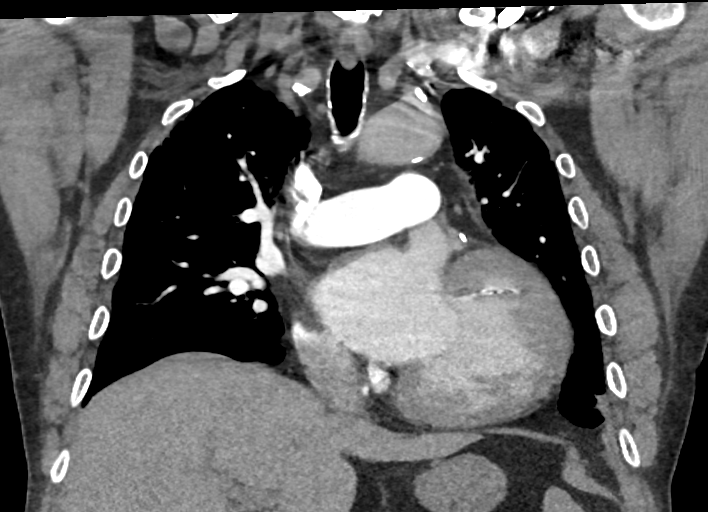
[im 115/153  soft-tissue]
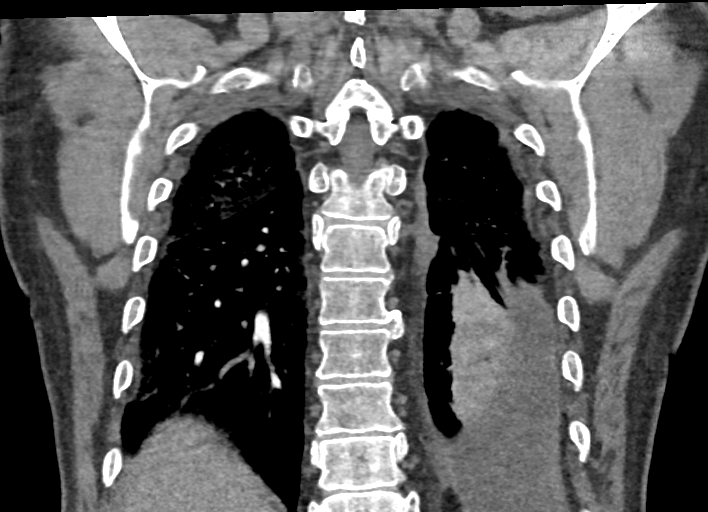

[17 of 46 positions shown; findings below may reference images not displayed]

RADIATION DOSE REDUCTION: This exam was performed according to the
departmental dose-optimization program which includes automated
exposure control, adjustment of the mA and/or kV according to
patient size and/or use of iterative reconstruction technique.

CONTRAST:  80mL OMNIPAQUE IOHEXOL 350 MG/ML SOLN
FINDINGS: Cardiovascular: Pulmonary arteries are well opacified. There is no
evidence of a pulmonary embolism.

Heart is mildly enlarged. No pericardial effusion. Coronary artery
calcifications and changes from prior CABG surgery. There are
surgical clips along the ascending aorta. Ascending aorta is dilated
to a maximum 4.3 cm. This is stable from the prior study a allowing
for differences in measurement technique.

Mediastinum/Nodes: No neck base, mediastinal or hilar masses or
enlarged lymph nodes. Trachea and esophagus are unremarkable.

Lungs/Pleura: Small left effusion. There is dependent opacity in the
left lower lobe consistent with atelectasis, similar to the prior
study.

Bilateral interstitial thickening. There are ground-glass opacities
bilaterally, most evident in the posterior right upper lobe, new
since the prior CT. Right pleural effusion seen previously has
resolved.

Stable centrilobular emphysema.  No pneumothorax.

Upper Abdomen: No acute abnormality.

Musculoskeletal: No fracture or acute finding. No bone lesion. No
chest wall mass.

Review of the MIP images confirms the above findings.
IMPRESSION: 1. No evidence of a pulmonary embolism.
2. New bilateral ground-glass lung opacities. Differential diagnosis
includes multifocal pneumonia and asymmetric pulmonary edema.
Associated interstitial thickening supports asymmetric pulmonary
edema.
3. Small left pleural effusion, unchanged from the prior CT. There
is enhancement along the parietal and visceral pleura surrounding
the effusion. Consider an empyema if there are consistent clinical
findings. There is associated dependent left lower lobe opacity
adjacent to this pleural effusion, also stable and consistent with
rounded atelectasis.
4. Dilated ascending thoracic aorta to 4.3 cm, unchanged from the
recent prior exam. Recommend annual imaging followup by CTA or MRA.
This recommendation follows 4343
ACCF/AHA/AATS/ACR/ASA/SCA/B-LEE/SAMSONAITE/LAURIA/GERJUS Guidelines for the
Diagnosis and Management of Patients with Thoracic Aortic Disease.
Circulation. 4343; 121: E266-e369. Aortic aneurysm NOS (JAT89-QQY.8)
5. Coronary artery calcifications and aortic atherosclerosis.

Aortic Atherosclerosis (JAT89-E3T.T) and Emphysema (JAT89-TXD.8).

## 2022-07-01 ENCOUNTER — Other Ambulatory Visit: Payer: Self-pay | Admitting: Family Medicine

## 2022-07-01 ENCOUNTER — Other Ambulatory Visit: Payer: Self-pay

## 2022-07-01 NOTE — Telephone Encounter (Signed)
Patient called and states that medication Ambien needs to be sent to [facility]. Medication pend and routed to PCP Hyacinth Meeker Bertram Millard, MD

## 2022-07-02 NOTE — Telephone Encounter (Signed)
Patient is requesting a refill of the following medications: Requested Prescriptions   Pending Prescriptions Disp Refills   zolpidem (AMBIEN) 10 MG tablet [Pharmacy Med Name: zolpidem 10 mg tablet] 30 tablet 1    Sig: TAKE 1 TABLET BY MOUTH AT BEDTIME    Date of last refill: 05/19/2022  Refill amount: 5  Treatment agreement date: unknown

## 2022-07-03 NOTE — Telephone Encounter (Signed)
Patient's pharmacy and patient call this morning in regard wanted an update on the medication was sent to the pharmacy.  Medication is pending for [name] Kuster, MD to sign.   Message sent to Mast, Man X, NP

## 2022-07-03 NOTE — Progress Notes (Signed)
Remote ICD transmission.   

## 2022-07-06 NOTE — Telephone Encounter (Signed)
Refill completed by Mast, Man, NP

## 2022-07-07 DIAGNOSIS — Z4789 Encounter for other orthopedic aftercare: Secondary | ICD-10-CM | POA: Diagnosis not present

## 2022-07-14 ENCOUNTER — Encounter: Payer: Self-pay | Admitting: Family

## 2022-07-14 ENCOUNTER — Ambulatory Visit (INDEPENDENT_AMBULATORY_CARE_PROVIDER_SITE_OTHER): Payer: PPO | Admitting: Family

## 2022-07-14 VITALS — BP 139/80 | HR 79 | Temp 97.8°F | Resp 18 | Ht 72.0 in | Wt 239.0 lb

## 2022-07-14 DIAGNOSIS — Z0000 Encounter for general adult medical examination without abnormal findings: Secondary | ICD-10-CM

## 2022-07-14 NOTE — Progress Notes (Signed)
Subjective:   [name] is a 67 y.o. male who presents for Medicare Annual/Subsequent preventive examination.  Visit Complete: In person  Patient Medicare AWV questionnaire was completed by the patient on 07/14/2022 ; I have confirmed that all information answered by patient is correct and no changes since this date.  Review of Systems     Cardiac Risk Factors include: advanced age (>72men, >75 women);male gender;hypertension;obesity (BMI >30kg/m2);smoking/ tobacco exposure     Objective:    Today's Vitals   07/14/22 0957 07/14/22 1028  BP: 139/80   Pulse: 79   Resp: 18   Temp: 97.8 F (36.6 C)   SpO2: 92%   Weight: 239 lb (108.4 kg)   Height: 6' (1.829 m)   PainSc:  8    Body mass index is 32.41 kg/m.     07/14/2022    9:54 AM 05/01/2022    4:10 PM 04/29/2022    9:26 AM 12/17/2021   12:58 PM 12/08/2021   10:23 AM 06/23/2021    2:00 PM 05/15/2021    3:39 PM  Advanced Directives  Does Patient Have a Medical Advance Directive? No No No No No No No  Would patient like information on creating a medical advance directive? No - Patient declined No - Patient declined No - Patient declined No - Patient declined No - Patient declined  No - Patient declined    Current Medications (verified) Outpatient Encounter Medications as of 07/14/2022  Medication Sig   acetaminophen (TYLENOL) 500 MG tablet Take 1,000 mg by mouth 3 (three) times daily.   Ascorbic Acid (SUPER C COMPLEX PO) Take 1 tablet by mouth 3 (three) times daily.   aspirin 81 MG EC tablet Take 81 mg by mouth in the morning.   carvedilol (COREG) 6.25 MG tablet TAKE 1 TABLET BY MOUTH 2 TIMES DAILY WITH A MEAL. **must have office visit FOR additional refills**   cetirizine (ZYRTEC) 10 MG tablet Take 10 mg by mouth daily as needed for allergies.   furosemide (LASIX) 40 MG tablet TAKE 1 TABLET BY MOUTH EVERY DAY   Misc Natural Products (DEEP SLEEP) CAPS Take 2 capsules by mouth at bedtime.   Multiple Vitamins-Minerals  (AIRBORNE GUMMIES PO) Take 3 capsules by mouth 2 (two) times daily.   nitroGLYCERIN (NITROSTAT) 0.4 MG SL tablet Place 1 tablet (0.4 mg total) under the tongue every 5 (five) minutes x 3 doses as needed for chest pain.   pantoprazole (PROTONIX) 40 MG tablet Take 1 tablet (40 mg total) by mouth daily.   REPATHA SURECLICK 140 MG/ML SOAJ INJECT into THE SKIN EVERY 14 DAYS   valsartan (DIOVAN) 40 MG tablet Take 1 tablet (40 mg total) by mouth daily.   zolpidem (AMBIEN) 10 MG tablet TAKE 1 TABLET BY MOUTH AT BEDTIME   No facility-administered encounter medications on file as of 07/14/2022.    Allergies (verified) Ezetimibe, Niacin, and Statins   History: Past Medical History:  Diagnosis Date   AICD (automatic cardioverter/defibrillator) present    Anxiety    Arthritis    "mild; back, neck, spine" (06/09/2017)   CHF (congestive heart failure) (HCC)    Chronic back pain    Coronary artery disease    GERD (gastroesophageal reflux disease)    History of gout    History of kidney stones X 2   Hypercholesterolemia    Hypertension    LBBB (left bundle branch block)    MI (myocardial infarction) (HCC) 1993   LATERAL   MI (  myocardial infarction) (HCC) ?09/2001; 06/07/2017   Pneumonia    "several times" (06/09/2017)   Pre-diabetes    S/P coronary artery stent placement    RCA   Varicose veins    Past Surgical History:  Procedure Laterality Date   ANTERIOR CERVICAL DECOMP/DISCECTOMY FUSION  03/03/2011   Procedure: ANTERIOR CERVICAL DECOMPRESSION/DISCECTOMY FUSION 3 LEVELS;  Surgeon: Karn Cassis, MD;  Location: [facility]URO ORS;  Service: Neurosurgery;  Laterality: N/A;  Cervical three-four Cervical four-five Cervical five-six Anterior cervical decompression/diskectomy, fusion   APPLICATION OF ROBOTIC ASSISTANCE FOR SPINAL PROCEDURE N/A 10/03/2020   Procedure: APPLICATION OF ROBOTIC ASSISTANCE FOR SPINAL PROCEDURE;  Surgeon: Lisbeth Renshaw, MD;  Location: [facility] OR;  Service: Neurosurgery;   Laterality: N/A;   BACK SURGERY     CARDIAC CATHETERIZATION  2009   Stents in RCA patent. 70 to 80% PL, and 50% ostial PD.    CARDIAC CATHETERIZATION N/A 11/20/2014   Procedure: Left Heart Cath and Coronary Angiography;  Surgeon: Peter M Swaziland, MD;  Location: [facility] INVASIVE CV LAB;  Service: Cardiovascular;  Laterality: N/A;   CORONARY ANGIOPLASTY     DIRECT ANGIOPLASTY THE MARGINAL BRANCH   CORONARY ANGIOPLASTY WITH STENT PLACEMENT     RCA   CORONARY ARTERY BYPASS GRAFT N/A 06/14/2017   Procedure: CORONARY ARTERY BYPASS GRAFTING (CABG) x four, using left internal mammary artery and right leg greater saphenous vein harvested endoscopically;  Surgeon: Kerin Perna, MD;  Location: [facility] OR;  Service: Open Heart Surgery;  Laterality: N/A;   CORONARY PRESSURE/FFR STUDY N/A 06/24/2021   Procedure: INTRAVASCULAR PRESSURE WIRE/FFR STUDY;  Surgeon: Marykay Lex, MD;  Location: [facility] INVASIVE CV LAB;  Service: Cardiovascular;  Laterality: N/A;   CORONARY STENT INTERVENTION N/A 06/24/2021   Procedure: CORONARY STENT INTERVENTION;  Surgeon: Marykay Lex, MD;  Location: [facility] INVASIVE CV LAB;  Service: Cardiovascular;  Laterality: N/A;   CYSTOSCOPY N/A 06/14/2017   Procedure: CYSTOSCOPY;  Surgeon: Kerin Perna, MD;  Location: [facility] OR;  Service: Open Heart Surgery;  Laterality: N/A;   ICD IMPLANT N/A 03/11/2018   Procedure: ICD IMPLANT - Dual Chamber;  Surgeon: [name], MD;  Location: [facility] INVASIVE CV LAB;  Service: Cardiovascular;  Laterality: N/A;   INSERTION OF SUPRAPUBIC CATHETER N/A 06/14/2017   Procedure: INSERTION OF SUPRAPUBIC CATHETER - Lower abdomen;  Surgeon: Kerin Perna, MD;  Location: [facility] OR;  Service: Open Heart Surgery;  Laterality: N/A;   POSTERIOR LUMBAR FUSION  10/2011   RIGHT/LEFT HEART CATH AND CORONARY ANGIOGRAPHY N/A 06/08/2017   Procedure: RIGHT/LEFT HEART CATH AND CORONARY ANGIOGRAPHY;  Surgeon: Swaziland, Peter M, MD;  Location: [facility] INVASIVE CV LAB;  Service: Cardiovascular;   Laterality: N/A;   RIGHT/LEFT HEART CATH AND CORONARY/GRAFT ANGIOGRAPHY N/A 09/06/2020   Procedure: RIGHT/LEFT HEART CATH AND CORONARY/GRAFT ANGIOGRAPHY;  Surgeon: Swaziland, Peter M, MD;  Location: [facility] INVASIVE CV LAB;  Service: Cardiovascular;  Laterality: N/A;   RIGHT/LEFT HEART CATH AND CORONARY/GRAFT ANGIOGRAPHY N/A 06/24/2021   Procedure: RIGHT/LEFT HEART CATH AND CORONARY/GRAFT ANGIOGRAPHY;  Surgeon: Marykay Lex, MD;  Location: [facility] INVASIVE CV LAB;  Service: Cardiovascular;  Laterality: N/A;   TEE WITHOUT CARDIOVERSION N/A 06/14/2017   Procedure: TRANSESOPHAGEAL ECHOCARDIOGRAM (TEE);  Surgeon: Donata Clay, Theron Arista, MD;  Location: [facility] OR;  Service: Open Heart Surgery;  Laterality: N/A;   TOTAL KNEE ARTHROPLASTY Right 12/17/2021   Procedure: TOTAL KNEE ARTHROPLASTY;  Surgeon: Eugenia Mcalpine, MD;  Location: [facility] ORS;  Service: Orthopedics;  Laterality: Right;  adductor canal  120  URETHROPLASTY N/A 10/15/2017   Procedure: URETHROPLASTY WITH BUCCAL GRAFT HARVAST;  Surgeon: Crist Fat, MD;  Location: [facility] ORS;  Service: Urology;  Laterality: N/A;   Family History  Problem Relation Age of Onset   Breast cancer Mother    Coronary artery disease Mother    Stroke Father    Heart attack Father    Diabetes Father    Heart attack Brother    Heart disease Brother    Heart attack Maternal Grandfather    Social History   Socioeconomic History   Marital status: Married    Spouse name: Not on file   Number of children: 1   Years of education: Not on file   Highest education level: Not on file  Occupational History   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  1987      Do you live in a house, apartment, assisted living, condo, trailer, etc.?  House      Is it one or more stories?  2 stories      How many persons live in your home?  2         Do you have any pets in your home?(please list)  No      Highest level of education completed:  11 Years      Current or past profession:  Sales-past      Do you exercise?:  YES    Type and how often:  Cardio and weights/At least everyday      Do you have a Living Will? (Form that indicates scenarios where you would not want your life prolonged)  YES      Do you have a DNR form?  YES       If not, would you like to discuss one?      Do you have signed POA/HPOA forms?  NO      Do you have difficulty bathing or dressing yourself?  NO      Do you have difficulty preparing food or eating?  NO      Do you have difficulty managing medications?  NO      Do you have difficulty managing your finances?  NO      Do you have difficulty affording your medications?  NO                     Social Determinants of Health   Financial Resource Strain: Low Risk  (01/09/2021)   Overall Financial Resource Strain (CARDIA)    Difficulty of Paying Living Expenses: Not very hard  Food Insecurity: Food Insecurity Present (12/17/2021)   Hunger Vital Sign    Worried About Running Out of Food in the Last Year:  Never true    Ran Out of Food in the Last Year: Sometimes true  Transportation Needs: No Transportation Needs (12/24/2021)   PRAPARE - Administrator, Civil Service (Medical): No    Lack of Transportation (Non-Medical): No  Physical Activity: Not on file  Stress: Not on file  Social Connections: Not on file    Tobacco  Counseling Counseling given: Not Answered   Clinical Intake:  Pre-visit preparation completed: No  Pain : 0-10 Pain Score: 8  Pain Type: Chronic pain Pain Location: Knee Pain Orientation: Right Pain Radiating Towards: No Pain Descriptors / Indicators: Aching Pain Onset: More than a month ago Pain Frequency: Constant Pain Relieving Factors: Prednisone Effect of Pain on Daily Activities: walking  Pain Relieving Factors: Prednisone  BMI - recorded: 32.41 Nutritional Status: BMI > 30  Obese Nutritional Risks: None Diabetes: No  How often do you need to have someone help you when you read instructions, pamphlets, or other written materials from your doctor or pharmacy?: 1 - Never What is the last grade level you completed in school?: 12 grade  Interpreter Needed?: No      Activities of Daily Living    07/14/2022   10:35 AM 12/17/2021   12:58 PM  In your present state of health, do you have any difficulty performing the following activities:  Hearing? 0 0  Vision? 0 0  Difficulty concentrating or making decisions? 0 0  Walking or climbing stairs? 1 0  Comment knee pain   Dressing or bathing? 0 0  Doing errands, shopping? 0 0  Preparing Food and eating ? N   Using the Toilet? Y   Comment constipation   In the past six months, have you accidently leaked urine? N   Do you have problems with loss of bowel control? N   Managing your Medications? N   Managing your Finances? N   Housekeeping or managing your Housekeeping? N     Patient Care Team: Frederica Kuster, MD as PCP - General (Family Medicine) Swaziland, Peter M, MD as PCP - Cardiology (Cardiology) [name], MD as PCP - Electrophysiology (Cardiology) Verner Chol, [facility] (Inactive) as Pharmacist (Pharmacist)  Indicate any recent Medical Services you may have received from other than [facility] providers in the past year (date may be approximate).     Assessment:   This is a routine wellness examination  for [name].  Hearing/Vision screen No results found.  Dietary issues and exercise activities discussed:     Goals Addressed   None    Depression Screen    07/14/2022    9:55 AM 05/13/2022   10:06 AM 04/23/2021    9:32 AM 03/10/2021   11:22 AM 09/03/2020    1:09 PM 01/11/2020   10:48 AM 12/05/2019    8:49 AM  PHQ 2/9 Scores  PHQ - 2 Score 0 0 0 0 0 0 0  Exception Documentation Other- indicate reason in comment box        Not completed AWV          Fall Risk    07/14/2022    9:55 AM 05/13/2022   10:06 AM 04/29/2022    9:26 AM 06/17/2021    9:13 AM 04/23/2021    9:32 AM  Fall Risk   Falls in the past year? 0 0 0 1 0  Number falls in past yr: 0 0 0 1 0  Injury with Fall? 0 0 0 1 0  Risk for  fall due to : No Fall Risks No Fall Risks No Fall Risks History of fall(s) No Fall Risks  Follow up Falls evaluation completed Falls evaluation completed Falls evaluation completed Falls evaluation completed Falls evaluation completed    MEDICARE RISK AT HOME:   TIMED UP AND GO:  Was the test performed?  Yes  Length of time to ambulate 10 feet: 8 sec Gait steady and fast without use of assistive device    Cognitive Function:    07/14/2022   10:13 AM  MMSE - Mini Mental State Exam  Orientation to time 5  Orientation to Place 5  Registration 3  Attention/ Calculation 5  Recall 3  Language- name 2 objects 2  Language- repeat 1  Language- follow 3 step command 3  Language- read & follow direction 1  Write a sentence 1  Copy design 1  Total score 30        Immunizations Immunization History  Administered Date(s) Administered   Fluad Quad(high Dose 65+) 12/05/2019, 09/30/2021   Influenza,inj,Quad PF,6+ Mos 02/17/2017, 10/16/2017   Influenza,inj,quad, With Preservative 10/18/2018   Influenza-Unspecified 10/30/2015, 01/14/2021   PFIZER(Purple Top)SARS-COV-2 Vaccination 05/27/2019, 10/31/2019   PNEUMOCOCCAL CONJUGATE-20 12/11/2020   Pfizer Covid-19 Vaccine Bivalent Booster 29yrs &  up 11/14/2020   Tdap 02/27/2016   Zoster Recombinant(Shingrix) 01/15/2021    TDAP status: Up to date  Flu Vaccine status: Up to date  Pneumococcal vaccine status: Up to date  Covid-19 vaccine status: Completed vaccines will obtain record  Qualifies for Shingles Vaccine? Yes   Zostavax completed No   Shingrix Completed?: Yes  Screening Tests Health Maintenance  Topic Date Due   Zoster Vaccines- Shingrix (2 of 2) 03/12/2021   Medicare Annual Wellness (AWV)  09/03/2021   COVID-19 Vaccine (4 - 2023-24 season) 09/05/2021   INFLUENZA VACCINE  08/06/2022   Colonoscopy  02/14/2025   DTaP/Tdap/Td (2 - Td or Tdap) 02/26/2026   Pneumonia Vaccine 55+ Years old  Completed   HPV VACCINES  Aged Out   Hepatitis C Screening  Discontinued    Health Maintenance  Health Maintenance Due  Topic Date Due   Zoster Vaccines- Shingrix (2 of 2) 03/12/2021   Medicare Annual Wellness (AWV)  09/03/2021   COVID-19 Vaccine (4 - 2023-24 season) 09/05/2021    Colorectal cancer screening: Type of screening: Colonoscopy. Completed 2023 . Repeat every 1 years  Lung Cancer Screening: (Low Dose CT Chest recommended if Age 70-80 years, 20 pack-year currently smoking OR have quit w/in 15years.) does qualify.   Lung Cancer Screening Referral: Yes   Additional Screening:  Hepatitis C Screening: does not qualify; Completed Yes   Vision Screening: Recommended annual ophthalmology exams for early detection of glaucoma and other disorders of the eye. Is the patient up to date with their annual eye exam?  Yes  Who is the provider or what is the name of the office in which the patient attends annual eye exams? [facility]  If pt is not established with a provider, would they like to be referred to a provider to establish care? No .   Dental Screening: Recommended annual dental exams for proper oral hygiene  Diabetic Foot Exam: Diabetic Foot Exam: Completed N/A   Community Resource Referral / Chronic Care  Management: CRR required this visit?  No   CCM required this visit?  No     Plan:     I have personally reviewed and noted the following in the patient's chart:   Medical and social  history Use of alcohol, tobacco or illicit drugs  Current medications and supplements including opioid prescriptions. Patient is not currently taking opioid prescriptions. Functional ability and status Nutritional status Physical activity Advanced directives List of other physicians Hospitalizations, surgeries, and ER visits in previous 12 months Vitals Screenings to include cognitive, depression, and falls Referrals and appointments  In addition, I have reviewed and discussed with patient certain preventive protocols, quality metrics, and best practice recommendations. A written personalized care plan for preventive services as well as general preventive health recommendations were provided to patient.     [name] Bookman, NP   07/14/2022   After Visit Summary: (Pick Up) Due to this being a telephonic visit, with patients personalized plan was offered to patient and patient has requested to Pick up at office.In office   Nurse Notes: Has had shingles,COVID-19 at the pharmacy will obtain records.

## 2022-07-14 NOTE — Patient Instructions (Signed)
[name] , Thank you for taking time to come for your Medicare Wellness Visit. I appreciate your ongoing commitment to your health goals. Please review the following plan we discussed and let me know if I can assist you in the future.   Screening recommendations/referrals: Colonoscopy : up to date  Recommended yearly ophthalmology/optometry visit for glaucoma screening and checkup Recommended yearly dental visit for hygiene and checkup  Vaccinations: Influenza vaccine due annually in September/October Pneumococcal vaccine  : up to date  Tdap vaccine  : up to date  Shingles vaccine : will obtain records for 2nd dose     Advanced directives: No   Conditions/risks identified: Cardiac Risk Factors include: advanced age (>66men, >81 women);male gender;hypertension;obesity (BMI >30kg/m2);smoking/ tobacco exposure  Next appointment: 1 year   Preventive Care 24 Years and Older, Male Preventive care refers to lifestyle choices and visits with your health care provider that can promote health and wellness. What does preventive care include? A yearly physical exam. This is also called an annual well check. Dental exams once or twice a year. Routine eye exams. Ask your health care provider how often you should have your eyes checked. Personal lifestyle choices, including: Daily care of your teeth and gums. Regular physical activity. Eating a healthy diet. Avoiding tobacco and drug use. Limiting alcohol use. Practicing safe sex. Taking low doses of aspirin every day. Taking vitamin and mineral supplements as recommended by your health care provider. What happens during an annual well check? The services and screenings done by your health care provider during your annual well check will depend on your age, overall health, lifestyle risk factors, and family history of disease. Counseling  Your health care provider may ask you questions about your: Alcohol use. Tobacco use. Drug  use. Emotional well-being. Home and relationship well-being. Sexual activity. Eating habits. History of falls. Memory and ability to understand (cognition). Work and work Astronomer. Screening  You may have the following tests or measurements: Height, weight, and BMI. Blood pressure. Lipid and cholesterol levels. These may be checked every 5 years, or more frequently if you are over 30 years old. Skin check. Lung cancer screening. You may have this screening every year starting at age 20 if you have a 30-pack-year history of smoking and currently smoke or have quit within the past 15 years. Fecal occult blood test (FOBT) of the stool. You may have this test every year starting at age 34. Flexible sigmoidoscopy or colonoscopy. You may have a sigmoidoscopy every 5 years or a colonoscopy every 10 years starting at age 75. Prostate cancer screening. Recommendations will vary depending on your family history and other risks. Hepatitis C blood test. Hepatitis B blood test. Sexually transmitted disease (STD) testing. Diabetes screening. This is done by checking your blood sugar (glucose) after you have not eaten for a while (fasting). You may have this done every 1-3 years. Abdominal aortic aneurysm (AAA) screening. You may need this if you are a current or former smoker. Osteoporosis. You may be screened starting at age 73 if you are at high risk. Talk with your health care provider about your test results, treatment options, and if necessary, the need for more tests. Vaccines  Your health care provider may recommend certain vaccines, such as: Influenza vaccine. This is recommended every year. Tetanus, diphtheria, and acellular pertussis (Tdap, Td) vaccine. You may need a Td booster every 10 years. Zoster vaccine. You may need this after age 64. Pneumococcal 13-valent conjugate (PCV13) vaccine. One dose  is recommended after age 21. Pneumococcal polysaccharide (PPSV23) vaccine. One dose is  recommended after age 26. Talk to your health care provider about which screenings and vaccines you need and how often you need them. This information is not intended to replace advice given to you by your health care provider. Make sure you discuss any questions you have with your health care provider. Document Released: 01/18/2015 Document Revised: 09/11/2015 Document Reviewed: 10/23/2014 Elsevier Interactive Patient Education  2017 [employer].  Fall Prevention in the Home Falls can cause injuries. They can happen to people of all ages. There are many things you can do to make your home safe and to help prevent falls. What can I do on the outside of my home? Regularly fix the edges of walkways and driveways and fix any cracks. Remove anything that might make you trip as you walk through a door, such as a raised step or threshold. Trim any bushes or trees on the path to your home. Use bright outdoor lighting. Clear any walking paths of anything that might make someone trip, such as rocks or tools. Regularly check to see if handrails are loose or broken. Make sure that both sides of any steps have handrails. Any raised decks and porches should have guardrails on the edges. Have any leaves, snow, or ice cleared regularly. Use sand or salt on walking paths during winter. Clean up any spills in your garage right away. This includes oil or grease spills. What can I do in the bathroom? Use night lights. Install grab bars by the toilet and in the tub and shower. Do not use towel bars as grab bars. Use non-skid mats or decals in the tub or shower. If you need to sit down in the shower, use a plastic, non-slip stool. Keep the floor dry. Clean up any water that spills on the floor as soon as it happens. Remove soap buildup in the tub or shower regularly. Attach bath mats securely with double-sided non-slip rug tape. Do not have throw rugs and other things on the floor that can make you  trip. What can I do in the bedroom? Use night lights. Make sure that you have a light by your bed that is easy to reach. Do not use any sheets or blankets that are too big for your bed. They should not hang down onto the floor. Have a firm chair that has side arms. You can use this for support while you get dressed. Do not have throw rugs and other things on the floor that can make you trip. What can I do in the kitchen? Clean up any spills right away. Avoid walking on wet floors. Keep items that you use a lot in easy-to-reach places. If you need to reach something above you, use a strong step stool that has a grab bar. Keep electrical cords out of the way. Do not use floor polish or wax that makes floors slippery. If you must use wax, use non-skid floor wax. Do not have throw rugs and other things on the floor that can make you trip. What can I do with my stairs? Do not leave any items on the stairs. Make sure that there are handrails on both sides of the stairs and use them. Fix handrails that are broken or loose. Make sure that handrails are as long as the stairways. Check any carpeting to make sure that it is firmly attached to the stairs. Fix any carpet that is loose or worn. Avoid  having throw rugs at the top or bottom of the stairs. If you do have throw rugs, attach them to the floor with carpet tape. Make sure that you have a light switch at the top of the stairs and the bottom of the stairs. If you do not have them, ask someone to add them for you. What else can I do to help prevent falls? Wear shoes that: Do not have high heels. Have rubber bottoms. Are comfortable and fit you well. Are closed at the toe. Do not wear sandals. If you use a stepladder: Make sure that it is fully opened. Do not climb a closed stepladder. Make sure that both sides of the stepladder are locked into place. Ask someone to hold it for you, if possible. Clearly mark and make sure that you can  see: Any grab bars or handrails. First and last steps. Where the edge of each step is. Use tools that help you move around (mobility aids) if they are needed. These include: Canes. Walkers. Scooters. Crutches. Turn on the lights when you go into a dark area. Replace any light bulbs as soon as they burn out. Set up your furniture so you have a clear path. Avoid moving your furniture around. If any of your floors are uneven, fix them. If there are any pets around you, be aware of where they are. Review your medicines with your doctor. Some medicines can make you feel dizzy. This can increase your chance of falling. Ask your doctor what other things that you can do to help prevent falls. This information is not intended to replace advice given to you by your health care provider. Make sure you discuss any questions you have with your health care provider. Document Released: 10/18/2008 Document Revised: 05/30/2015 Document Reviewed: 01/26/2014 Elsevier Interactive Patient Education  2017 [employer].

## 2022-07-31 ENCOUNTER — Other Ambulatory Visit: Payer: Self-pay | Admitting: Nurse Practitioner

## 2022-07-31 NOTE — Telephone Encounter (Signed)
Patient is requesting a refill of the following medications: Requested Prescriptions   Pending Prescriptions Disp Refills   zolpidem (AMBIEN) 10 MG tablet [Pharmacy Med Name: zolpidem 10 mg tablet] 30 tablet 1    Sig: TAKE 1 TABLET BY MOUTH AT BEDTIME    Date of last refill:07/03/2022  Refill amount: 1  Treatment agreement date: unknown

## 2022-08-12 DIAGNOSIS — M25561 Pain in right knee: Secondary | ICD-10-CM | POA: Diagnosis not present

## 2022-08-14 ENCOUNTER — Encounter: Payer: Self-pay | Admitting: Family Medicine

## 2022-08-17 ENCOUNTER — Encounter: Payer: Self-pay | Admitting: Family Medicine

## 2022-08-18 ENCOUNTER — Encounter: Payer: Self-pay | Admitting: Family Medicine

## 2022-08-24 ENCOUNTER — Other Ambulatory Visit: Payer: Self-pay | Admitting: Cardiology

## 2022-08-26 ENCOUNTER — Encounter: Payer: Self-pay | Admitting: Family Medicine

## 2022-08-26 ENCOUNTER — Ambulatory Visit (INDEPENDENT_AMBULATORY_CARE_PROVIDER_SITE_OTHER): Payer: PPO | Admitting: Family Medicine

## 2022-08-26 VITALS — BP 130/78 | HR 84 | Temp 97.3°F | Ht 72.0 in

## 2022-08-26 DIAGNOSIS — L237 Allergic contact dermatitis due to plants, except food: Secondary | ICD-10-CM | POA: Diagnosis not present

## 2022-08-26 DIAGNOSIS — L259 Unspecified contact dermatitis, unspecified cause: Secondary | ICD-10-CM | POA: Insufficient documentation

## 2022-08-26 MED ORDER — PREDNISONE 10 MG PO TABS: 10.0000 mg | ORAL_TABLET | Freq: Every day | ORAL | 0 refills | Status: DC

## 2022-08-26 NOTE — Progress Notes (Signed)
Provider:  Jacalyn Lefevre, MD  Careteam: Patient Care Team: Venita Sheffield, MD as PCP - General (Internal Medicine) Swaziland, Peter M, MD as PCP - Cardiology (Cardiology) [name], MD as PCP - Electrophysiology (Cardiology) Verner Chol, [facility] (Inactive) as Pharmacist (Pharmacist)  PLACE OF SERVICE:  [facility]  Advanced Directive information Does Patient Have a Medical Advance Directive?: No, Would patient like information on creating a medical advance directive?: No - Patient declined  Allergies  Allergen Reactions   Ezetimibe Other (See Comments)    Joint pain and drops BP   Niacin Other (See Comments)    Drops BP and severe joint/muscle pain   Statins Other (See Comments)    Severe joint pain and drops BP.    Chief Complaint  Patient presents with   Acute Visit    Poison [place]      HPI: Patient is a 67 y.o. male patient presents with rash that has been present 1 day.  He was trimming some scrubs for a relative over the weekend and developed rash yesterday.  He has had a history of contact dermatitis related to poison ivy.  Review of Systems:  Review of Systems  Skin:  Positive for itching and rash.  All other systems reviewed and are negative.   Past Medical History:  Diagnosis Date   AICD (automatic cardioverter/defibrillator) present    Anxiety    Arthritis    "mild; back, neck, spine" (06/09/2017)   CHF (congestive heart failure) (HCC)    Chronic back pain    Coronary artery disease    GERD (gastroesophageal reflux disease)    History of gout    History of kidney stones X 2   Hypercholesterolemia    Hypertension    LBBB (left bundle branch block)    MI (myocardial infarction) (HCC) 1993   LATERAL   MI (myocardial infarction) (HCC) ?09/2001; 06/07/2017   Pneumonia    "several times" (06/09/2017)   Pre-diabetes    S/P coronary artery stent placement    RCA   Varicose veins    Past Surgical History:  Procedure Laterality Date    ANTERIOR CERVICAL DECOMP/DISCECTOMY FUSION  03/03/2011   Procedure: ANTERIOR CERVICAL DECOMPRESSION/DISCECTOMY FUSION 3 LEVELS;  Surgeon: Karn Cassis, MD;  Location: [facility] NEURO ORS;  Service: Neurosurgery;  Laterality: N/A;  Cervical three-four Cervical four-five Cervical five-six Anterior cervical decompression/diskectomy, fusion   APPLICATION OF ROBOTIC ASSISTANCE FOR SPINAL PROCEDURE N/A 10/03/2020   Procedure: APPLICATION OF ROBOTIC ASSISTANCE FOR SPINAL PROCEDURE;  Surgeon: Lisbeth Renshaw, MD;  Location: [facility] OR;  Service: Neurosurgery;  Laterality: N/A;   BACK SURGERY     CARDIAC CATHETERIZATION  2009   Stents in RCA patent. 70 to 80% PL, and 50% ostial PD.    CARDIAC CATHETERIZATION N/A 11/20/2014   Procedure: Left Heart Cath and Coronary Angiography;  Surgeon: Peter M Swaziland, MD;  Location: [facility] INVASIVE CV LAB;  Service: Cardiovascular;  Laterality: N/A;   CORONARY ANGIOPLASTY     DIRECT ANGIOPLASTY THE MARGINAL BRANCH   CORONARY ANGIOPLASTY WITH STENT PLACEMENT     RCA   CORONARY ARTERY BYPASS GRAFT N/A 06/14/2017   Procedure: CORONARY ARTERY BYPASS GRAFTING (CABG) x four, using left internal mammary artery and right leg greater saphenous vein harvested endoscopically;  Surgeon: Kerin Perna, MD;  Location: [facility] OR;  Service: Open Heart Surgery;  Laterality: N/A;   CORONARY PRESSURE/FFR STUDY N/A 06/24/2021   Procedure: INTRAVASCULAR PRESSURE WIRE/FFR STUDY;  Surgeon: Marykay Lex,  MD;  Location: [facility] INVASIVE CV LAB;  Service: Cardiovascular;  Laterality: N/A;   CORONARY STENT INTERVENTION N/A 06/24/2021   Procedure: CORONARY STENT INTERVENTION;  Surgeon: Marykay Lex, MD;  Location: [facility] INVASIVE CV LAB;  Service: Cardiovascular;  Laterality: N/A;   CYSTOSCOPY N/A 06/14/2017   Procedure: CYSTOSCOPY;  Surgeon: Kerin Perna, MD;  Location: [facility] OR;  Service: Open Heart Surgery;  Laterality: N/A;   ICD IMPLANT N/A 03/11/2018   Procedure: ICD IMPLANT - Dual Chamber;  Surgeon: [name], MD;  Location: [facility] INVASIVE CV LAB;  Service: Cardiovascular;  Laterality: N/A;   INSERTION OF SUPRAPUBIC CATHETER N/A 06/14/2017   Procedure: INSERTION OF SUPRAPUBIC CATHETER - Lower abdomen;  Surgeon: Kerin Perna, MD;  Location: [facility] OR;  Service: Open Heart Surgery;  Laterality: N/A;   POSTERIOR LUMBAR FUSION  10/2011   RIGHT/LEFT HEART CATH AND CORONARY ANGIOGRAPHY N/A 06/08/2017   Procedure: RIGHT/LEFT HEART CATH AND CORONARY ANGIOGRAPHY;  Surgeon: Swaziland, Peter M, MD;  Location: [facility] INVASIVE CV LAB;  Service: Cardiovascular;  Laterality: N/A;   RIGHT/LEFT HEART CATH AND CORONARY/GRAFT ANGIOGRAPHY N/A 09/06/2020   Procedure: RIGHT/LEFT HEART CATH AND CORONARY/GRAFT ANGIOGRAPHY;  Surgeon: Swaziland, Peter M, MD;  Location: [facility] INVASIVE CV LAB;  Service: Cardiovascular;  Laterality: N/A;   RIGHT/LEFT HEART CATH AND CORONARY/GRAFT ANGIOGRAPHY N/A 06/24/2021   Procedure: RIGHT/LEFT HEART CATH AND CORONARY/GRAFT ANGIOGRAPHY;  Surgeon: Marykay Lex, MD;  Location: [facility] INVASIVE CV LAB;  Service: Cardiovascular;  Laterality: N/A;   TEE WITHOUT CARDIOVERSION N/A 06/14/2017   Procedure: TRANSESOPHAGEAL ECHOCARDIOGRAM (TEE);  Surgeon: Donata Clay, Theron Arista, MD;  Location: [facility] OR;  Service: Open Heart Surgery;  Laterality: N/A;   TOTAL KNEE ARTHROPLASTY Right 12/17/2021   Procedure: TOTAL KNEE ARTHROPLASTY;  Surgeon: Eugenia Mcalpine, MD;  Location: [facility] ORS;  Service: Orthopedics;  Laterality: Right;  adductor canal  120   URETHROPLASTY N/A 10/15/2017   Procedure: URETHROPLASTY WITH BUCCAL GRAFT HARVAST;  Surgeon: Crist Fat, MD;  Location: [facility] ORS;  Service: Urology;  Laterality: N/A;   Social History:   reports that he quit smoking about 23 years ago. His smoking use included cigarettes. He started smoking about 43 years ago. He has a 40 pack-year smoking history. He has never used smokeless tobacco. He reports that he does not currently use alcohol. He reports that he does not use drugs.  Family  History  Problem Relation Age of Onset   Breast cancer Mother    Coronary artery disease Mother    Stroke Father    Heart attack Father    Diabetes Father    Heart attack Brother    Heart disease Brother    Heart attack Maternal Grandfather     Medications: Patient's Medications  New Prescriptions   No medications on file  Previous Medications   ACETAMINOPHEN (TYLENOL) 500 MG TABLET    Take 1,000 mg by mouth 3 (three) times daily.   ASCORBIC ACID (SUPER C COMPLEX PO)    Take 1 tablet by mouth 3 (three) times daily.   ASPIRIN 81 MG EC TABLET    Take 81 mg by mouth in the morning.   CARVEDILOL (COREG) 6.25 MG TABLET    TAKE 1 TABLET BY MOUTH 2 TIMES DAILY WITH A MEAL. **must have office visit FOR additional refills**   CETIRIZINE (ZYRTEC) 10 MG TABLET    Take 10 mg by mouth daily as needed for allergies.   FUROSEMIDE (LASIX) 40 MG TABLET    TAKE  1 TABLET BY MOUTH EVERY DAY   MISC NATURAL PRODUCTS (DEEP SLEEP) CAPS    Take 2 capsules by mouth at bedtime.   MULTIPLE VITAMINS-MINERALS (AIRBORNE GUMMIES PO)    Take 3 capsules by mouth 2 (two) times daily.   NITROGLYCERIN (NITROSTAT) 0.4 MG SL TABLET    Place 1 tablet (0.4 mg total) under the tongue every 5 (five) minutes x 3 doses as needed for chest pain.   PANTOPRAZOLE (PROTONIX) 40 MG TABLET    Take 1 tablet (40 mg total) by mouth daily.   REPATHA SURECLICK 140 MG/ML SOAJ    INJECT into THE SKIN EVERY 14 DAYS   VALSARTAN (DIOVAN) 40 MG TABLET    Take 1 tablet (40 mg total) by mouth daily.   ZOLPIDEM (AMBIEN) 10 MG TABLET    TAKE 1 TABLET BY MOUTH AT BEDTIME  Modified Medications   No medications on file  Discontinued Medications   No medications on file    Physical Exam:  Vitals:   08/26/22 1547  BP: 130/78  Pulse: 84  Temp: (!) 97.3 F (36.3 C)  TempSrc: Temporal  SpO2: 96%  Height: 6' (1.829 m)   Body mass index is 32.41 kg/m. Wt Readings from Last 3 Encounters:  07/14/22 239 lb (108.4 kg)  05/14/22 225 lb 12.8 oz  (102.4 kg)  05/13/22 228 lb (103.4 kg)    Physical Exam Vitals and nursing note reviewed.  Constitutional:      Appearance: Normal appearance.  Skin:    General: Skin is warm.     Comments: Rash on hands and arms consistent with rhus dermatitis  Neurological:     Mental Status: He is alert.     Labs reviewed: Basic Metabolic Panel: Recent Labs    12/17/21 1647 12/18/21 0340 05/01/22 1615  NA 139 139 136  K 4.1 5.1 4.3  CL 104 102 98  CO2 27 28 27   GLUCOSE 159* 145* 100*  BUN 20 24* 18  CREATININE 0.95 1.11 0.90  CALCIUM 8.5* 9.0 9.1   Liver Function Tests: Recent Labs    12/08/21 1039 05/01/22 1615  AST 20 23  ALT 22 30  ALKPHOS 49 68  BILITOT 0.9 0.8  PROT 6.8 6.7  ALBUMIN 3.8 3.7   Recent Labs    05/01/22 1615  LIPASE 26   No results for input(s): "AMMONIA" in the last 8760 hours. CBC: Recent Labs    12/17/21 1647 12/18/21 0340 05/01/22 1615  WBC 8.2 12.9* 7.3  HGB 13.3 13.1 13.5  HCT 40.9 40.0 41.4  MCV 101.0* 99.8 100.0  PLT 177 222 244   Lipid Panel: Recent Labs    05/18/22 1027  CHOL 154  HDL 45  LDLCALC 75  TRIG 207*  CHOLHDL 3.4   TSH: No results for input(s): "TSH" in the last 8760 hours. A1C: Lab Results  Component Value Date   HGBA1C 6.4 (H) 12/08/2021     Assessment/Plan  1. Allergic contact dermatitis due to plants, except food Rash consistent with rhus dermatitis.  Rash only been present 1 day we will initiate treatment with Kenalog 20 mg IM and follow-up with tapered dose of prednisone   Jacalyn Lefevre, MD [facility] [phone]

## 2022-09-09 LAB — CUP PACEART REMOTE DEVICE CHECK
Battery Remaining Longevity: 7 mo
Battery Voltage: 2.83 V
Brady Statistic AP VP Percent: 0.14 %
Brady Statistic AP VS Percent: 0.02 %
Brady Statistic AS VP Percent: 99.3 %
Brady Statistic AS VS Percent: 0.53 %
Brady Statistic RA Percent Paced: 0.17 %
Brady Statistic RV Percent Paced: 98.48 %
Date Time Interrogation Session: 20240904033424
HighPow Impedance: 90 Ohm
Implantable Lead Connection Status: 753985
Implantable Lead Connection Status: 753985
Implantable Lead Connection Status: 753985
Implantable Lead Implant Date: 20190610
Implantable Lead Implant Date: 20200306
Implantable Lead Implant Date: 20200306
Implantable Lead Location: 753858
Implantable Lead Location: 753859
Implantable Lead Location: 753860
Implantable Lead Model: 5071
Implantable Lead Model: 5076
Implantable Pulse Generator Implant Date: 20200306
Lead Channel Impedance Value: 285 Ohm
Lead Channel Impedance Value: 380 Ohm
Lead Channel Impedance Value: 399 Ohm
Lead Channel Impedance Value: 4047 Ohm
Lead Channel Impedance Value: 4047 Ohm
Lead Channel Impedance Value: 456 Ohm
Lead Channel Pacing Threshold Amplitude: 0.5 V
Lead Channel Pacing Threshold Amplitude: 0.625 V
Lead Channel Pacing Threshold Amplitude: 1.75 V
Lead Channel Pacing Threshold Pulse Width: 0.4 ms
Lead Channel Pacing Threshold Pulse Width: 0.4 ms
Lead Channel Pacing Threshold Pulse Width: 1 ms
Lead Channel Sensing Intrinsic Amplitude: 3.875 mV
Lead Channel Sensing Intrinsic Amplitude: 3.875 mV
Lead Channel Sensing Intrinsic Amplitude: 31.625 mV
Lead Channel Sensing Intrinsic Amplitude: 31.625 mV
Lead Channel Setting Pacing Amplitude: 1.5 V
Lead Channel Setting Pacing Amplitude: 2.5 V
Lead Channel Setting Pacing Amplitude: 3 V
Lead Channel Setting Pacing Pulse Width: 0.4 ms
Lead Channel Setting Pacing Pulse Width: 1 ms
Lead Channel Setting Sensing Sensitivity: 0.6 mV
Zone Setting Status: 755011
Zone Setting Status: 755011

## 2022-09-17 DIAGNOSIS — Z951 Presence of aortocoronary bypass graft: Secondary | ICD-10-CM | POA: Diagnosis not present

## 2022-09-17 DIAGNOSIS — I130 Hypertensive heart and chronic kidney disease with heart failure and stage 1 through stage 4 chronic kidney disease, or unspecified chronic kidney disease: Secondary | ICD-10-CM | POA: Diagnosis not present

## 2022-09-17 DIAGNOSIS — Z87891 Personal history of nicotine dependence: Secondary | ICD-10-CM | POA: Diagnosis not present

## 2022-09-17 DIAGNOSIS — Z6829 Body mass index (BMI) 29.0-29.9, adult: Secondary | ICD-10-CM | POA: Diagnosis not present

## 2022-09-17 DIAGNOSIS — N182 Chronic kidney disease, stage 2 (mild): Secondary | ICD-10-CM | POA: Diagnosis not present

## 2022-09-17 DIAGNOSIS — I2510 Atherosclerotic heart disease of native coronary artery without angina pectoris: Secondary | ICD-10-CM | POA: Diagnosis not present

## 2022-09-17 DIAGNOSIS — I5022 Chronic systolic (congestive) heart failure: Secondary | ICD-10-CM | POA: Diagnosis not present

## 2022-09-22 ENCOUNTER — Encounter: Payer: Self-pay | Admitting: Sports Medicine

## 2022-09-22 ENCOUNTER — Ambulatory Visit (INDEPENDENT_AMBULATORY_CARE_PROVIDER_SITE_OTHER): Payer: PPO | Admitting: Sports Medicine

## 2022-09-22 VITALS — BP 130/70 | HR 85 | Temp 97.1°F | Resp 16 | Ht 72.0 in | Wt 231.0 lb

## 2022-09-22 DIAGNOSIS — G8929 Other chronic pain: Secondary | ICD-10-CM | POA: Diagnosis not present

## 2022-09-22 DIAGNOSIS — M25561 Pain in right knee: Secondary | ICD-10-CM | POA: Diagnosis not present

## 2022-09-22 NOTE — Progress Notes (Signed)
Careteam: Patient Care Team: Venita Sheffield, MD as PCP - General (Internal Medicine) Swaziland, Peter M, MD as PCP - Cardiology (Cardiology) [name], MD as PCP - Electrophysiology (Cardiology) Verner Chol, [facility] (Inactive) as Pharmacist (Pharmacist)  PLACE OF SERVICE:  [facility]  Advanced Directive information Does Patient Have a Medical Advance Directive?: No, Would patient like information on creating a medical advance directive?: No - Patient declined  Allergies  Allergen Reactions   Ezetimibe Other (See Comments)    Joint pain and drops BP   Niacin Other (See Comments)    Drops BP and severe joint/muscle pain   Statins Other (See Comments)    Severe joint pain and drops BP.    Chief Complaint  Patient presents with   Referral    Patient is requesting referral to [facility]     HPI: Patient is a 67 y.o. male is here for Rt knee pain .  Rt knee pain  Chronic  Had TKR in December  7/10 with pain radiating to ant shin bine Has swelling around his knee since surgery  He is  here  requesting a referral to [facility]  and handicapped form filled out States he could walk upto 100 ft but as the day passes he is barely able to walk 25 feet  Denies fevers, chills   Review of Systems:  Review of Systems  Constitutional:  Negative for chills and fever.  HENT:  Negative for congestion and sore throat.   Eyes:  Negative for double vision.  Respiratory:  Negative for cough, sputum production and shortness of breath.   Cardiovascular:  Negative for chest pain, palpitations and leg swelling.  Gastrointestinal:  Negative for abdominal pain, heartburn and nausea.  Genitourinary:  Negative for dysuria, frequency and hematuria.  Musculoskeletal:  Negative for falls and myalgias.  Neurological:  Negative for dizziness, sensory change and focal weakness.     Past Medical History:  Diagnosis Date   AICD (automatic cardioverter/defibrillator) present     Anxiety    Arthritis    "mild; back, neck, spine" (06/09/2017)   CHF (congestive heart failure) (HCC)    Chronic back pain    Coronary artery disease    GERD (gastroesophageal reflux disease)    History of gout    History of kidney stones X 2   Hypercholesterolemia    Hypertension    LBBB (left bundle branch block)    MI (myocardial infarction) (HCC) 1993   LATERAL   MI (myocardial infarction) (HCC) ?09/2001; 06/07/2017   Pneumonia    "several times" (06/09/2017)   Pre-diabetes    S/P coronary artery stent placement    RCA   Varicose veins    Past Surgical History:  Procedure Laterality Date   ANTERIOR CERVICAL DECOMP/DISCECTOMY FUSION  03/03/2011   Procedure: ANTERIOR CERVICAL DECOMPRESSION/DISCECTOMY FUSION 3 LEVELS;  Surgeon: Karn Cassis, MD;  Location: [facility] NEURO ORS;  Service: Neurosurgery;  Laterality: N/A;  Cervical three-four Cervical four-five Cervical five-six Anterior cervical decompression/diskectomy, fusion   APPLICATION OF ROBOTIC ASSISTANCE FOR SPINAL PROCEDURE N/A 10/03/2020   Procedure: APPLICATION OF ROBOTIC ASSISTANCE FOR SPINAL PROCEDURE;  Surgeon: Lisbeth Renshaw, MD;  Location: [facility] OR;  Service: Neurosurgery;  Laterality: N/A;   BACK SURGERY     CARDIAC CATHETERIZATION  2009   Stents in RCA patent. 70 to 80% PL, and 50% ostial PD.    CARDIAC CATHETERIZATION N/A 11/20/2014   Procedure: Left Heart Cath and Coronary Angiography;  Surgeon:  Peter M Swaziland, MD;  Location: [facility] INVASIVE CV LAB;  Service: Cardiovascular;  Laterality: N/A;   CORONARY ANGIOPLASTY     DIRECT ANGIOPLASTY THE MARGINAL BRANCH   CORONARY ANGIOPLASTY WITH STENT PLACEMENT     RCA   CORONARY ARTERY BYPASS GRAFT N/A 06/14/2017   Procedure: CORONARY ARTERY BYPASS GRAFTING (CABG) x four, using left internal mammary artery and right leg greater saphenous vein harvested endoscopically;  Surgeon: Kerin Perna, MD;  Location: [facility] OR;  Service: Open Heart Surgery;  Laterality: N/A;   CORONARY  PRESSURE/FFR STUDY N/A 06/24/2021   Procedure: INTRAVASCULAR PRESSURE WIRE/FFR STUDY;  Surgeon: Marykay Lex, MD;  Location: [facility] INVASIVE CV LAB;  Service: Cardiovascular;  Laterality: N/A;   CORONARY STENT INTERVENTION N/A 06/24/2021   Procedure: CORONARY STENT INTERVENTION;  Surgeon: Marykay Lex, MD;  Location: [facility] INVASIVE CV LAB;  Service: Cardiovascular;  Laterality: N/A;   CYSTOSCOPY N/A 06/14/2017   Procedure: CYSTOSCOPY;  Surgeon: Kerin Perna, MD;  Location: [facility] OR;  Service: Open Heart Surgery;  Laterality: N/A;   ICD IMPLANT N/A 03/11/2018   Procedure: ICD IMPLANT - Dual Chamber;  Surgeon: [name], MD;  Location: [facility] INVASIVE CV LAB;  Service: Cardiovascular;  Laterality: N/A;   INSERTION OF SUPRAPUBIC CATHETER N/A 06/14/2017   Procedure: INSERTION OF SUPRAPUBIC CATHETER - Lower abdomen;  Surgeon: Kerin Perna, MD;  Location: [facility] OR;  Service: Open Heart Surgery;  Laterality: N/A;   POSTERIOR LUMBAR FUSION  10/2011   RIGHT/LEFT HEART CATH AND CORONARY ANGIOGRAPHY N/A 06/08/2017   Procedure: RIGHT/LEFT HEART CATH AND CORONARY ANGIOGRAPHY;  Surgeon: Swaziland, Peter M, MD;  Location: [facility] INVASIVE CV LAB;  Service: Cardiovascular;  Laterality: N/A;   RIGHT/LEFT HEART CATH AND CORONARY/GRAFT ANGIOGRAPHY N/A 09/06/2020   Procedure: RIGHT/LEFT HEART CATH AND CORONARY/GRAFT ANGIOGRAPHY;  Surgeon: Swaziland, Peter M, MD;  Location: [facility] INVASIVE CV LAB;  Service: Cardiovascular;  Laterality: N/A;   RIGHT/LEFT HEART CATH AND CORONARY/GRAFT ANGIOGRAPHY N/A 06/24/2021   Procedure: RIGHT/LEFT HEART CATH AND CORONARY/GRAFT ANGIOGRAPHY;  Surgeon: Marykay Lex, MD;  Location: [facility] INVASIVE CV LAB;  Service: Cardiovascular;  Laterality: N/A;   TEE WITHOUT CARDIOVERSION N/A 06/14/2017   Procedure: TRANSESOPHAGEAL ECHOCARDIOGRAM (TEE);  Surgeon: Donata Clay, Theron Arista, MD;  Location: [facility] OR;  Service: Open Heart Surgery;  Laterality: N/A;   TOTAL KNEE ARTHROPLASTY Right 12/17/2021   Procedure: TOTAL KNEE  ARTHROPLASTY;  Surgeon: Eugenia Mcalpine, MD;  Location: [facility] ORS;  Service: Orthopedics;  Laterality: Right;  adductor canal  120   URETHROPLASTY N/A 10/15/2017   Procedure: URETHROPLASTY WITH BUCCAL GRAFT HARVAST;  Surgeon: Crist Fat, MD;  Location: [facility] ORS;  Service: Urology;  Laterality: N/A;   Social History:   reports that he quit smoking about 23 years ago. His smoking use included cigarettes. He started smoking about 43 years ago. He has a 40 pack-year smoking history. He has never used smokeless tobacco. He reports that he does not currently use alcohol. He reports that he does not use drugs.  Family History  Problem Relation Age of Onset   Breast cancer Mother    Coronary artery disease Mother    Stroke Father    Heart attack Father    Diabetes Father    Heart attack Brother    Heart disease Brother    Heart attack Maternal Grandfather     Medications: Patient's Medications  New Prescriptions   No medications on file  Previous Medications   ACETAMINOPHEN (TYLENOL) 500 MG TABLET  Take 1,000 mg by mouth 3 (three) times daily.   ASCORBIC ACID (SUPER C COMPLEX PO)    Take 1 tablet by mouth 3 (three) times daily.   ASPIRIN 81 MG EC TABLET    Take 81 mg by mouth in the morning.   CARVEDILOL (COREG) 6.25 MG TABLET    TAKE 1 TABLET BY MOUTH 2 TIMES DAILY WITH A MEAL. **must have office visit FOR additional refills**   CETIRIZINE (ZYRTEC) 10 MG TABLET    Take 10 mg by mouth daily as needed for allergies.   FUROSEMIDE (LASIX) 40 MG TABLET    TAKE 1 TABLET BY MOUTH EVERY DAY   MISC NATURAL PRODUCTS (DEEP SLEEP) CAPS    Take 2 capsules by mouth at bedtime.   MULTIPLE VITAMINS-MINERALS (AIRBORNE GUMMIES PO)    Take 3 capsules by mouth 2 (two) times daily.   NITROGLYCERIN (NITROSTAT) 0.4 MG SL TABLET    Place 1 tablet (0.4 mg total) under the tongue every 5 (five) minutes x 3 doses as needed for chest pain.   PANTOPRAZOLE (PROTONIX) 40 MG TABLET    Take 1 tablet (40 mg total) by  mouth daily.   PREDNISONE (DELTASONE) 10 MG TABLET    Take 1 tablet (10 mg total) by mouth daily with breakfast. Take 5 tab for 5 days then take 4 tab for 1 day, 3 tab for 1 day, 2 tag for a day, and finish 1 tab   REPATHA SURECLICK 140 MG/ML SOAJ    INJECT into THE SKIN EVERY 14 DAYS   VALSARTAN (DIOVAN) 40 MG TABLET    Take 1 tablet (40 mg total) by mouth daily.   ZOLPIDEM (AMBIEN) 10 MG TABLET    TAKE 1 TABLET BY MOUTH AT BEDTIME  Modified Medications   No medications on file  Discontinued Medications   No medications on file    Physical Exam:  Vitals:   09/22/22 1107  BP: 130/70  Pulse: 85  Resp: 16  Temp: (!) 97.1 F (36.2 C)  SpO2: 95%  Weight: 231 lb (104.8 kg)  Height: 6' (1.829 m)   Body mass index is 31.33 kg/m. Wt Readings from Last 3 Encounters:  09/22/22 231 lb (104.8 kg)  07/14/22 239 lb (108.4 kg)  05/14/22 225 lb 12.8 oz (102.4 kg)    Physical Exam Constitutional:      Appearance: Normal appearance.  HENT:     Head: Normocephalic and atraumatic.  Cardiovascular:     Rate and Rhythm: Normal rate and regular rhythm.     Pulses: Normal pulses.     Heart sounds: Normal heart sounds.  Pulmonary:     Effort: No respiratory distress.     Breath sounds: No stridor. No wheezing or rales.  Abdominal:     General: Bowel sounds are normal. There is no distension.     Palpations: Abdomen is soft.     Tenderness: There is no abdominal tenderness. There is no right CVA tenderness or guarding.  Musculoskeletal:        General: No swelling.     Comments: Rt knee- warm, tender to touch , swollen Joint line tenderness  Rom limited due to pain   Neurological:     Mental Status: He is alert. Mental status is at baseline.     Sensory: No sensory deficit.     Motor: No weakness.     Labs reviewed: Basic Metabolic Panel: Recent Labs    12/17/21 1647 12/18/21 0340 05/01/22 1615  NA  139 139 136  K 4.1 5.1 4.3  CL 104 102 98  CO2 27 28 27   GLUCOSE 159* 145*  100*  BUN 20 24* 18  CREATININE 0.95 1.11 0.90  CALCIUM 8.5* 9.0 9.1   Liver Function Tests: Recent Labs    12/08/21 1039 05/01/22 1615  AST 20 23  ALT 22 30  ALKPHOS 49 68  BILITOT 0.9 0.8  PROT 6.8 6.7  ALBUMIN 3.8 3.7   Recent Labs    05/01/22 1615  LIPASE 26   No results for input(s): "AMMONIA" in the last 8760 hours. CBC: Recent Labs    12/17/21 1647 12/18/21 0340 05/01/22 1615  WBC 8.2 12.9* 7.3  HGB 13.3 13.1 13.5  HCT 40.9 40.0 41.4  MCV 101.0* 99.8 100.0  PLT 177 222 244   Lipid Panel: Recent Labs    05/18/22 1027  CHOL 154  HDL 45  LDLCALC 75  TRIG 207*  CHOLHDL 3.4   TSH: No results for input(s): "TSH" in the last 8760 hours. A1C: Lab Results  Component Value Date   HGBA1C 6.4 (H) 12/08/2021     Assessment/Plan  1. Chronic pain of right knee  Chronic,  Rt knee- warm, tender to touch , moderate swelling with joint line tenderness Pt reports that since he had surgery he is having pain with swelling He followed with [facility] , pt says they did x rays and was told he did not had infection in his knee He is requesting to be seen by [facility] - AMB referral to orthopedics  Handicap form filled out  No follow-ups on file.:  3 months

## 2022-09-28 DIAGNOSIS — Z96651 Presence of right artificial knee joint: Secondary | ICD-10-CM | POA: Diagnosis not present

## 2022-09-28 DIAGNOSIS — G8929 Other chronic pain: Secondary | ICD-10-CM | POA: Diagnosis not present

## 2022-09-28 DIAGNOSIS — M25561 Pain in right knee: Secondary | ICD-10-CM | POA: Diagnosis not present

## 2022-10-12 ENCOUNTER — Other Ambulatory Visit: Payer: Self-pay | Admitting: Cardiology

## 2022-10-12 ENCOUNTER — Other Ambulatory Visit: Payer: Self-pay | Admitting: Family Medicine

## 2022-10-12 ENCOUNTER — Ambulatory Visit (INDEPENDENT_AMBULATORY_CARE_PROVIDER_SITE_OTHER): Payer: PPO

## 2022-10-12 DIAGNOSIS — I5022 Chronic systolic (congestive) heart failure: Secondary | ICD-10-CM

## 2022-10-12 LAB — CUP PACEART REMOTE DEVICE CHECK
Battery Remaining Longevity: 7 mo
Battery Voltage: 2.83 V
Brady Statistic AP VP Percent: 0.06 %
Brady Statistic AP VS Percent: 0.02 %
Brady Statistic AS VP Percent: 99.45 %
Brady Statistic AS VS Percent: 0.47 %
Brady Statistic RA Percent Paced: 0.08 %
Brady Statistic RV Percent Paced: 98.88 %
Date Time Interrogation Session: 20241007012303
HighPow Impedance: 80 Ohm
Implantable Lead Connection Status: 753985
Implantable Lead Connection Status: 753985
Implantable Lead Connection Status: 753985
Implantable Lead Implant Date: 20190610
Implantable Lead Implant Date: 20200306
Implantable Lead Implant Date: 20200306
Implantable Lead Location: 753858
Implantable Lead Location: 753859
Implantable Lead Location: 753860
Implantable Lead Model: 5071
Implantable Lead Model: 5076
Implantable Pulse Generator Implant Date: 20200306
Lead Channel Impedance Value: 285 Ohm
Lead Channel Impedance Value: 399 Ohm
Lead Channel Impedance Value: 399 Ohm
Lead Channel Impedance Value: 4047 Ohm
Lead Channel Impedance Value: 4047 Ohm
Lead Channel Impedance Value: 494 Ohm
Lead Channel Pacing Threshold Amplitude: 0.625 V
Lead Channel Pacing Threshold Amplitude: 0.75 V
Lead Channel Pacing Threshold Amplitude: 2 V
Lead Channel Pacing Threshold Pulse Width: 0.4 ms
Lead Channel Pacing Threshold Pulse Width: 0.4 ms
Lead Channel Pacing Threshold Pulse Width: 1 ms
Lead Channel Sensing Intrinsic Amplitude: 3.375 mV
Lead Channel Sensing Intrinsic Amplitude: 3.375 mV
Lead Channel Sensing Intrinsic Amplitude: 31.625 mV
Lead Channel Sensing Intrinsic Amplitude: 31.625 mV
Lead Channel Setting Pacing Amplitude: 1.5 V
Lead Channel Setting Pacing Amplitude: 2.5 V
Lead Channel Setting Pacing Amplitude: 3 V
Lead Channel Setting Pacing Pulse Width: 0.4 ms
Lead Channel Setting Pacing Pulse Width: 1 ms
Lead Channel Setting Sensing Sensitivity: 0.6 mV
Zone Setting Status: 755011
Zone Setting Status: 755011

## 2022-10-13 NOTE — Telephone Encounter (Signed)
Patient is requesting a refill of the following medications: Requested Prescriptions   Pending Prescriptions Disp Refills   zolpidem (AMBIEN) 10 MG tablet [Pharmacy Med Name: zolpidem 10 mg tablet] 30 tablet 1    Sig: TAKE 1 TABLET BY MOUTH AT BEDTIME    Date of last refill:08/03/2022  Refill amount: 1  Treatment agreement date: Unknown

## 2022-10-14 ENCOUNTER — Other Ambulatory Visit: Payer: Self-pay

## 2022-10-14 MED ORDER — CARVEDILOL 6.25 MG PO TABS: 6.2500 mg | ORAL_TABLET | Freq: Two times a day (BID) | ORAL | 2 refills | Status: DC

## 2022-10-15 DIAGNOSIS — M7061 Trochanteric bursitis, right hip: Secondary | ICD-10-CM | POA: Diagnosis not present

## 2022-10-15 DIAGNOSIS — M7062 Trochanteric bursitis, left hip: Secondary | ICD-10-CM | POA: Diagnosis not present

## 2022-10-23 NOTE — Progress Notes (Signed)
Remote ICD transmission.   

## 2022-11-09 ENCOUNTER — Other Ambulatory Visit (HOSPITAL_COMMUNITY): Payer: Self-pay | Admitting: Cardiology

## 2022-11-12 ENCOUNTER — Ambulatory Visit (INDEPENDENT_AMBULATORY_CARE_PROVIDER_SITE_OTHER): Payer: PPO

## 2022-11-12 DIAGNOSIS — I447 Left bundle-branch block, unspecified: Secondary | ICD-10-CM

## 2022-11-12 LAB — CUP PACEART REMOTE DEVICE CHECK
Battery Remaining Longevity: 6 mo
Battery Voltage: 2.82 V
Brady Statistic AP VP Percent: 0.12 %
Brady Statistic AP VS Percent: 0.02 %
Brady Statistic AS VP Percent: 99.47 %
Brady Statistic AS VS Percent: 0.39 %
Brady Statistic RA Percent Paced: 0.14 %
Brady Statistic RV Percent Paced: 98.34 %
Date Time Interrogation Session: 20241107012404
HighPow Impedance: 95 Ohm
Implantable Lead Connection Status: 753985
Implantable Lead Connection Status: 753985
Implantable Lead Connection Status: 753985
Implantable Lead Implant Date: 20190610
Implantable Lead Implant Date: 20200306
Implantable Lead Implant Date: 20200306
Implantable Lead Location: 753858
Implantable Lead Location: 753859
Implantable Lead Location: 753860
Implantable Lead Model: 5071
Implantable Lead Model: 5076
Implantable Pulse Generator Implant Date: 20200306
Lead Channel Impedance Value: 323 Ohm
Lead Channel Impedance Value: 4047 Ohm
Lead Channel Impedance Value: 4047 Ohm
Lead Channel Impedance Value: 437 Ohm
Lead Channel Impedance Value: 437 Ohm
Lead Channel Impedance Value: 494 Ohm
Lead Channel Pacing Threshold Amplitude: 0.5 V
Lead Channel Pacing Threshold Amplitude: 0.75 V
Lead Channel Pacing Threshold Amplitude: 2 V
Lead Channel Pacing Threshold Pulse Width: 0.4 ms
Lead Channel Pacing Threshold Pulse Width: 0.4 ms
Lead Channel Pacing Threshold Pulse Width: 1 ms
Lead Channel Sensing Intrinsic Amplitude: 31.625 mV
Lead Channel Sensing Intrinsic Amplitude: 31.625 mV
Lead Channel Sensing Intrinsic Amplitude: 4.5 mV
Lead Channel Sensing Intrinsic Amplitude: 4.5 mV
Lead Channel Setting Pacing Amplitude: 1.5 V
Lead Channel Setting Pacing Amplitude: 2.5 V
Lead Channel Setting Pacing Amplitude: 3 V
Lead Channel Setting Pacing Pulse Width: 0.4 ms
Lead Channel Setting Pacing Pulse Width: 1 ms
Lead Channel Setting Sensing Sensitivity: 0.6 mV
Zone Setting Status: 755011
Zone Setting Status: 755011

## 2022-11-13 ENCOUNTER — Other Ambulatory Visit: Payer: Self-pay | Admitting: Cardiology

## 2022-11-18 ENCOUNTER — Other Ambulatory Visit: Payer: Self-pay | Admitting: Sports Medicine

## 2022-11-18 ENCOUNTER — Other Ambulatory Visit: Payer: PPO | Admitting: Sports Medicine

## 2022-11-18 MED ORDER — ZOLPIDEM TARTRATE 10 MG PO TABS: 10.0000 mg | ORAL_TABLET | Freq: Every day | ORAL | 0 refills | Status: DC

## 2022-11-18 NOTE — Telephone Encounter (Signed)
Kristen from [facility] called to request a refill for patient's Ambien. There was a miscommunication on their part so she said she had sent in the script electronically.

## 2022-11-18 NOTE — Telephone Encounter (Signed)
Patient is requesting a refill of the following medications: Requested Prescriptions   Pending Prescriptions Disp Refills   zolpidem (AMBIEN) 10 MG tablet 30 tablet 0    Sig: Take 1 tablet (10 mg total) by mouth at bedtime.    Date of last refill:10/13/2022  Refill amount: 30 tablets   Treatment agreement date:

## 2022-11-18 NOTE — Telephone Encounter (Signed)
Patient has request refill on medication Ambien. Patient medication last refilled 10/13/2022. Patient has no contract on file. Medication pend and sent to PCP Venita Sheffield, MD

## 2022-11-19 NOTE — Progress Notes (Signed)
Cardiology Office Note:    Date:  11/23/2022   ID:  [name], DOB 1955-07-04, MRN [id number]  PCP:  Venita Sheffield, MD  Cardiologist:  Guthrie Lemme [place], MD  Electrophysiologist:  Sherryl Manges, MD   Referring MD: Frederica Kuster, MD   Chief Complaint  Patient presents with   Congestive Heart Failure   Coronary Artery Disease     History of Present Illness:    [name] is a 67 y.o. male with a hx of CAD s/p CABG, hypertension, left bundle branch block, hyperlipidemia, and chronic systolic heart failure.  Patient was admitted in June 2019 with flash pulmonary edema.  He was ruled in for NSTEMI and underwent cardiac catheterization which showed severe three-vessel CAD. He underwent CABG by Dr Donata Clay with LIMA to LAD, SVG to diagonal, SVG to ramus intermediate, and SVG to RCA.  He had a syncope in June 2019 while waiting in the neurology office due to urethral stricture.  He was noted to have wide-complex tachycardia on telemetry with heart rate of 170.  EF was 35 to 40%.  Lower extremity Doppler was negative for DVT.  CT of the chest was negative for PE.  He was seen by EP, the underlying rhythm could be either VT versus NSVT.  He was fitted with LifeVest.  Medication was cut back due to mild hypotension and orthostasis.  Repeat echocardiogram in September 2019 showed persistently low EF of 25 to 30%.  He was unable to tolerate Entresto, spironolactone and carvedilol due to orthostatic symptom and the lightheadedness.  Patient eventually underwent BIV ICD implantation in March 2020 by Dr. Graciela Husbands after failing another trial of medical therapy.  Since then, repeat echocardiogram in July 2020 did show significant improvement in EF to 40 to 45%.  Was seen by EP on 08/14/20.  Noted persistent chest pain. Also notes increased dyspnea. Mild edema. Was given lasix which he has taken a couple of times. Myoview ordered. This showed EF drop to 17% with large inferolateral scar and  anteroapical ischemia. Repeat Echo is showed EF 40%.  Cardiac cath was done. This showed all grafts patent except for SVG to ramus which was occluded.  Right heart pressures, LV filling pressures and CO were normal. Recommended addition of SGLT 2 inhibitor. He did have lumbar surgery in September 2022.   He was  seen in the office on 06/20/2021 ordered a myriad of concerns including fatigue, lightheadedness, intermittent low heart rate, dyspnea on exertion, and multiple episodes of presyncope. Additionally, he reported bilateral lower extremity edema, right lower leg pain, and intermittent cramping in his calves with activity.  He was scheduled for outpatient cardiac catheterization for further evaluation. He, however, presented to the ED on 06/23/2021 with complaints of shortness of breath.  He was hospitalized from 6/19-6/21/2023 in the setting of acute heart failure exacerbation/CAD with anginal equivalent.  Troponin was mildly elevated. Chest x-ray showed mild CHF, small left pleural effusion. He received IV Lasix.  Lower extremity duplex was negative for DVT.  CTA of the chest was negative for PE.  He underwent cardiac catheterization on 06/24/2021 which revealed stable three-vessel CAD, known occluded SVG-RI, otherwise patent grafts, progression of proximal ramus intermedius lesion  to 80% with positive FFR s/p DES, RHC with relatively normal pressures.    On follow up today he is doing well. Rides his bike 30-60 minutes a day and goes to the gym 3-4 times a week. Doing a lot of yard work. No SOB.  No angina. Still has soreness at his ICD site at night and can't lie on his left side. No edema. Only taking lasix 20 mg daily now.   Last device check Nov 7 showed normal device function but getting close to ERI.    Past Medical History:  Diagnosis Date   AICD (automatic cardioverter/defibrillator) present    Anxiety    Arthritis    "mild; back, neck, spine" (06/09/2017)   CHF (congestive heart failure)  (HCC)    Chronic back pain    Coronary artery disease    GERD (gastroesophageal reflux disease)    History of gout    History of kidney stones X 2   Hypercholesterolemia    Hypertension    LBBB (left bundle branch block)    MI (myocardial infarction) (HCC) 1993   LATERAL   MI (myocardial infarction) (HCC) ?09/2001; 06/07/2017   Pneumonia    "several times" (06/09/2017)   Pre-diabetes    S/P coronary artery stent placement    RCA   Varicose veins     Past Surgical History:  Procedure Laterality Date   ANTERIOR CERVICAL DECOMP/DISCECTOMY FUSION  03/03/2011   Procedure: ANTERIOR CERVICAL DECOMPRESSION/DISCECTOMY FUSION 3 LEVELS;  Surgeon: Karn Cassis, MD;  Location: [facility]URO ORS;  Service: Neurosurgery;  Laterality: N/A;  Cervical three-four Cervical four-five Cervical five-six Anterior cervical decompression/diskectomy, fusion   APPLICATION OF ROBOTIC ASSISTANCE FOR SPINAL PROCEDURE N/A 10/03/2020   Procedure: APPLICATION OF ROBOTIC ASSISTANCE FOR SPINAL PROCEDURE;  Surgeon: Lisbeth Renshaw, MD;  Location: [facility] OR;  Service: Neurosurgery;  Laterality: N/A;   BACK SURGERY     CARDIAC CATHETERIZATION  2009   Stents in RCA patent. 70 to 80% PL, and 50% ostial PD.    CARDIAC CATHETERIZATION N/A 11/20/2014   Procedure: Left Heart Cath and Coronary Angiography;  Surgeon: Jermell Holeman M [place], MD;  Location: [facility] INVASIVE CV LAB;  Service: Cardiovascular;  Laterality: N/A;   CORONARY ANGIOPLASTY     DIRECT ANGIOPLASTY THE MARGINAL BRANCH   CORONARY ANGIOPLASTY WITH STENT PLACEMENT     RCA   CORONARY ARTERY BYPASS GRAFT N/A 06/14/2017   Procedure: CORONARY ARTERY BYPASS GRAFTING (CABG) x four, using left internal mammary artery and right leg greater saphenous vein harvested endoscopically;  Surgeon: Kerin Perna, MD;  Location: [facility] OR;  Service: Open Heart Surgery;  Laterality: N/A;   CORONARY PRESSURE/FFR STUDY N/A 06/24/2021   Procedure: INTRAVASCULAR PRESSURE WIRE/FFR STUDY;  Surgeon:  Marykay Lex, MD;  Location: [facility] INVASIVE CV LAB;  Service: Cardiovascular;  Laterality: N/A;   CORONARY STENT INTERVENTION N/A 06/24/2021   Procedure: CORONARY STENT INTERVENTION;  Surgeon: Marykay Lex, MD;  Location: [facility] INVASIVE CV LAB;  Service: Cardiovascular;  Laterality: N/A;   CYSTOSCOPY N/A 06/14/2017   Procedure: CYSTOSCOPY;  Surgeon: Kerin Perna, MD;  Location: [facility] OR;  Service: Open Heart Surgery;  Laterality: N/A;   ICD IMPLANT N/A 03/11/2018   Procedure: ICD IMPLANT - Dual Chamber;  Surgeon: [name], MD;  Location: [facility] INVASIVE CV LAB;  Service: Cardiovascular;  Laterality: N/A;   INSERTION OF SUPRAPUBIC CATHETER N/A 06/14/2017   Procedure: INSERTION OF SUPRAPUBIC CATHETER - Lower abdomen;  Surgeon: Kerin Perna, MD;  Location: [facility] OR;  Service: Open Heart Surgery;  Laterality: N/A;   POSTERIOR LUMBAR FUSION  10/2011   RIGHT/LEFT HEART CATH AND CORONARY ANGIOGRAPHY N/A 06/08/2017   Procedure: RIGHT/LEFT HEART CATH AND CORONARY ANGIOGRAPHY;  Surgeon: [place], Vinie Charity M, MD;  Location: [facility] INVASIVE CV  LAB;  Service: Cardiovascular;  Laterality: N/A;   RIGHT/LEFT HEART CATH AND CORONARY/GRAFT ANGIOGRAPHY N/A 09/06/2020   Procedure: RIGHT/LEFT HEART CATH AND CORONARY/GRAFT ANGIOGRAPHY;  Surgeon: [place], Cristi Gwynn M, MD;  Location: [facility] INVASIVE CV LAB;  Service: Cardiovascular;  Laterality: N/A;   RIGHT/LEFT HEART CATH AND CORONARY/GRAFT ANGIOGRAPHY N/A 06/24/2021   Procedure: RIGHT/LEFT HEART CATH AND CORONARY/GRAFT ANGIOGRAPHY;  Surgeon: Marykay Lex, MD;  Location: [facility] INVASIVE CV LAB;  Service: Cardiovascular;  Laterality: N/A;   TEE WITHOUT CARDIOVERSION N/A 06/14/2017   Procedure: TRANSESOPHAGEAL ECHOCARDIOGRAM (TEE);  Surgeon: Donata Clay, Theron Arista, MD;  Location: [facility] OR;  Service: Open Heart Surgery;  Laterality: N/A;   TOTAL KNEE ARTHROPLASTY Right 12/17/2021   Procedure: TOTAL KNEE ARTHROPLASTY;  Surgeon: Eugenia Mcalpine, MD;  Location: [facility] ORS;  Service: Orthopedics;  Laterality:  Right;  adductor canal  120   URETHROPLASTY N/A 10/15/2017   Procedure: URETHROPLASTY WITH BUCCAL GRAFT HARVAST;  Surgeon: Crist Fat, MD;  Location: [facility] ORS;  Service: Urology;  Laterality: N/A;    Current Medications: Current Meds  Medication Sig   acetaminophen (TYLENOL) 500 MG tablet Take 1,000 mg by mouth 3 (three) times daily. Pt. Reports he is taking half   Ascorbic Acid (SUPER C COMPLEX PO) Take 1 tablet by mouth 3 (three) times daily.   aspirin 81 MG EC tablet Take 81 mg by mouth in the morning.   carvedilol (COREG) 6.25 MG tablet TAKE 1 TABLET BY MOUTH 2 TIMES DAILY WITH A meal **Must have office visit FOR additional refills**   cetirizine (ZYRTEC) 10 MG tablet Take 10 mg by mouth daily as needed for allergies.   furosemide (LASIX) 40 MG tablet TAKE 1 TABLET BY MOUTH EVERY DAY   Misc Natural Products (DEEP SLEEP) CAPS Take 2 capsules by mouth at bedtime.   Multiple Vitamins-Minerals (AIRBORNE GUMMIES PO) Take 3 capsules by mouth 2 (two) times daily.   nitroGLYCERIN (NITROSTAT) 0.4 MG SL tablet Place 1 tablet (0.4 mg total) under the tongue every 5 (five) minutes x 3 doses as needed for chest pain.   pantoprazole (PROTONIX) 40 MG tablet Take 1 tablet (40 mg total) by mouth daily.   REPATHA SURECLICK 140 MG/ML SOAJ INJECT into THE SKIN EVERY 14 DAYS   valsartan (DIOVAN) 40 MG tablet TAKE 1 TABLET BY MOUTH EVERY DAY   zolpidem (AMBIEN) 10 MG tablet Take 1 tablet (10 mg total) by mouth at bedtime.     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  Tobacco Use   Smoking status: Former    Current packs/day: 0.00    Average packs/day: 2.0 packs/day for 20.0 years (40.0 ttl pk-yrs)    Types: Cigarettes    Start date: 05/07/1979     Quit date: 05/07/1999    Years since quitting: 23.5   Smokeless tobacco: Never  Vaping Use   Vaping status: Never Used  Substance and Sexual Activity   Alcohol use: Not Currently    Comment: 2-3 per month (occasionally)   Drug use: Never   Sexual activity: Not Currently  Other Topics Concern   Not on file  Social History Narrative   Diet      Do you drink/eat things with  caffeine  No      Marital Status Married What year were you married?  1987      Do you live in a house, apartment, assisted living, condo, trailer, etc.?  House      Is it one or more stories?  2 stories      How many persons live in your home?  2         Do you have any pets in your home?(please list)  No      Highest level of education completed:  11 Years      Current or past profession:  Sales-past      Do you exercise?:  YES    Type and how often:  Cardio and weights/At least everyday      Do you have a Living Will? (Form that indicates scenarios where you would not want your life prolonged)  YES      Do you have a DNR form?  YES       If not, would you like to discuss one?      Do you have signed POA/HPOA forms?  NO      Do you have difficulty bathing or dressing yourself?  NO      Do you have difficulty preparing food or eating?  NO      Do you have difficulty managing medications?  NO      Do you have difficulty managing your finances?  NO      Do you have difficulty affording your medications?  NO                     Social Determinants of Health   Financial Resource Strain: Low Risk  (01/09/2021)   Overall Financial Resource Strain (CARDIA)    Difficulty of Paying Living Expenses: Not very hard  Food Insecurity: Food Insecurity Present (12/17/2021)   Hunger Vital Sign    Worried About Running Out of Food in the Last Year: Never true    Ran Out of Food in the Last Year: Sometimes true  Transportation Needs: No Transportation Needs (12/24/2021)   PRAPARE - Doctor, general practice (Medical): No    Lack of Transportation (Non-Medical): No  Physical Activity: Not on file  Stress: Not on file  Social Connections: Unknown (05/19/2021)   Received from [facility]   Social Network    Social Network: Not on file     Family History: The patient's family history includes Breast cancer in his mother; Coronary artery disease in his mother; Diabetes in his father; Heart attack in his brother, father, and maternal grandfather; Heart disease in his brother; Stroke in his father.  ROS:   Please see the history of present illness.     All other systems reviewed and are negative.  EKGs/Labs/Other Studies Reviewed:    The following studies were reviewed today:  Echo 07/11/2018 1. The left ventricle has mild-moderately reduced systolic function, with an ejection fraction of 40-45%. The cavity size was normal. Left ventricular diastolic Doppler parameters are indeterminate. Indeterminate filling pressures.  2. The right ventricle has normal systolic function. The cavity was normal. There is no increase in right ventricular wall thickness. Right ventricular systolic pressure could not be assessed.  3. Left atrial size was mildly dilated.  4. Aortic valve regurgitation is mild to moderate by color flow Doppler. No stenosis of the aortic valve.  5. The Ascending aorta measures 39 mm,  within normal limits when indexed to body surface area.  6. When compared to the prior study: 01/21/2018- LV ejection fraction has improved. Ventricular septal contraction is less dysynchronous. Aortic valve regurgitation has not significantly changed. Side by side comparison of images performed.  Myoview 08/21/20: Study Highlights    Nuclear stress EF: 17%. The left ventricular ejection fraction is severely decreased (<30%). There was no ST segment deviation noted during stress. Defect 1: There is a large defect of severe severity present in the basal inferior, basal  inferolateral, mid inferior, mid inferolateral and apical inferior location. Defect 2: There is a small defect of mild severity present in the apical anterior location. This is a high risk study. Findings consistent with ischemia and prior myocardial infarction.   High risk study due to severely decreased left ventricular function. There is a large (old) inferolateral scar. There is a small area of mild anteroapical ischemia. There has been a marked reduction in left ventricular systolic function since 2018.  Recommend correlation with echo and consider possible "balanced ischemia". Compared to ECG from 02/05/2020, there is a marked reduction in the amplitude of the initial R wave in V1, although the initial deflection is still positive. Visually, there appears to be substantial septal-lateral dyssynchrony. Consider reevaluation of CRT device settings.    Echo 08/30/20: IMPRESSIONS     1. Poor acoustic windows limit study. Definity used. LVEF is moderately  depressed with diffuse hypokinesis worse in the annteiror and lateral  walls. LVEF is approximately 40%. The left ventricle has moderately  decreased function. The left ventricular  internal cavity size was severely dilated. There is mild left ventricular  hypertrophy. Indeterminate diastolic filling due to E-A fusion.   2. Right ventricular systolic function is normal. The right ventricular  size is normal.   3. Mild mitral valve regurgitation.   4. Aortic valve regurgitation is mild. Mild aortic valve sclerosis is  present, with no evidence of aortic valve stenosis.   5. Aortic dilatation noted. There is mild dilatation of the aortic root,  measuring 42 mm.   Comparison(s): The left ventricular function is unchanged.   Cardiac cath 09/06/20:  RIGHT/LEFT HEART CATH AND CORONARY/GRAFT ANGIOGRAPHY   Conclusion      Ost LM to Mid LM lesion is 40% stenosed.   Prox LAD to Mid LAD lesion is 100% stenosed.   Ramus lesion is 60%  stenosed.   Prox RCA lesion is 90% stenosed.   Origin lesion is 100% stenosed.   SVG graft was visualized by angiography and is normal in caliber.   SVG graft was visualized by angiography.   SVG graft was visualized by angiography and is normal in caliber.   LIMA graft was visualized by angiography and is normal in caliber.   The graft exhibits no disease.   The graft exhibits no disease.   The graft exhibits no disease.   LV end diastolic pressure is normal.   There is no aortic valve stenosis.   3 vessel CAD.  Patent LIMA to the LAD.  Patent SVG to the first diagonal Patent SVG to the PDA Occluded SVG to the Ramus intermediate Normal LV filling pressures Normal right heart pressures Normal cardiac output   Plan: recommend continued medical management. The lesion in the ramus intermediate is moderate - in some views appears <40% and other views about 60-65%. This is certainly not critical. Good flow down the native vessel is probably the reason for graft closure. His EF by Echo is in  line with prior Echo in 2020. Based on current findings I cannot explain his dyspnea. Would consider outpatient sleep study and/or PFTs to consider other causes. We will add Jardiance for his CHF and DM. He does not need diuresis.    Echo 06/16/21: IMPRESSIONS     1. Left ventricular ejection fraction, by estimation, is 30 to 35%. Left  ventricular ejection fraction by 3D volume is 31 %. The left ventricle has  moderately decreased function. The left ventricle demonstrates global  hypokinesis. The left ventricular  internal cavity size was moderately dilated. Left ventricular diastolic  parameters are consistent with Grade I diastolic dysfunction (impaired  relaxation).   2. Right ventricular systolic function is mildly reduced. The right  ventricular size is normal. There is normal pulmonary artery systolic  pressure. The estimated right ventricular systolic pressure is 33.2 mmHg.   3. Left atrial  size was mild to moderately dilated.   4. The mitral valve is grossly normal. Mild mitral valve regurgitation.  No evidence of mitral stenosis.   5. The aortic valve is tricuspid. Aortic valve regurgitation is mild. No  aortic stenosis is present.   6. There is mild dilatation of the ascending aorta, measuring 41 mm.   7. The inferior vena cava is normal in size with greater than 50%  respiratory variability, suggesting right atrial pressure of 3 mmHg.   Comparison(s): Changes from prior study are noted. The left ventricular  function is worsened.    Cardiac cath 06/24/21: Procedures  CORONARY STENT INTERVENTION  INTRAVASCULAR PRESSURE WIRE/FFR STUDY  RIGHT/LEFT HEART CATH AND CORONARY/GRAFT ANGIOGRAPHY   Conclusion      -------------- NATIVE CORONARIES------------   Prox RCA lesion is 90% stenosed.  Dist RCA lesion is 90% stenosed.   Ost LM to Mid LM lesion is 40% stenosed.   Prox LAD lesion is 100% stenosed prior to D1. Prox LAD to Mid LAD lesion is 100% stenosed following D1.   CULPRIT LESION: Ramus lesion is 75% stenosed.  (Borderline positive by RFR (0.91) and FFR 0.80) -> given the patient's symptoms, this is felt to be significant.   A drug-eluting stent was successfully placed using a SYNERGY XD 3.0X16 -> deployed to 3.3 mm.   Post intervention, there is a 0% residual stenosis.   -------------- GRAFTS -------------------------------   LIMA-LAD graft was visualized by angiography and is normal in caliber. The graft exhibits no disease. There is no competitive flow   SVG-D1 graft was visualized by angiography and is normal in caliber. The graft exhibits no disease, and the flow is not reversed.  There is retrograde filling of a major septal perforator trunk in the LAD that it would otherwise not be perfused by the native LAD or retrograde filling from the LIMA..   SVG-rPDA graft was visualized by angiography and is moderate in size.  The graft exhibits no disease. There is  trivial competitive flow   SVG-RI graft was visualized by angiography, Origin lesion is 100% stenosed.   -------------- HEMODYNAMICS-----------------   LV end diastolic pressure is normal.   There is no aortic valve stenosis.   POST-OPERATIVE DIAGNOSIS:   Stable Three-Vessel CAD: Ostial mid OM 40%.  Proximal to mid LAD 100%.  Proximal RCA 90% at bend with trace filling of the PDA and PL.  Patent LCx that is small and nondominant.   Culprit lesion: Progression of proximal Ramus Intermedius (RI) lesion from 60% to 70-80% -> RFR 0.90, 0.91 with a FFR of 0.80.  Based on his  symptoms thought to be significant. Successful DES PCI of ostial and proximal L RI using Synergy DES 3.0 mm x 16 mm deployed to 3.3 mm. Reducing lesion from 70-80% to 0% TIMI-3 flow pre and post.   Known occluded SVG-RI with otherwise patent SVG-PDA, SVG-D1, and LIMA-LAD RHC pressures relatively normal:  RAP 2 mmHg, RVP-EDP 28/0-0 mmHg; PAP-mean 19/10-15 mmHg, PCWP 16 mmHg; LVP-EDP 120/1-12 mmHg,  AoP-MAP 116/54 mmHg - 72 mmHg; Ao sat 98%, PA sat 69% Cardiac Output-Index: Fick 5.32-2.33; TD 3.21-1.4 (suggestive of valvular disease.   PLAN OF CARE:  Return to nursing floor for post PCI care after sheath removal. ->  Dual and platelet therapy for 6 months, then okay to stop aspirin.  Seems relatively euvolemic on right heart cath.     Bryan Lemma, MD  Right Heart  Right Heart Pressures PAP-mean 19/10-15 mmHg,  PCWP 16 mmHg;  LV EDP is normal. Minimally elevated: LVP-EDP 120/1-12 mmHg,  RHC pressures relatively normal:   AoP-MAP 116/54 mmHg - 72 mmHg;   Ao sat 98%, PA sat 69%  Cardiac Output-Index: Fick 5.32-2.33; TD 3.21-1.4 (suggestive of valvular disease.  Right Atrium Right atrial pressure is normal. RAP 2 mmHg,  Right Ventricle RVP-EDP 28/0-0 mmHg;  Coronary Diagrams  Diagnostic Dominance: Right  Intervention    EKG:  EKG is not ordered today.   Recent Labs: 05/01/2022: ALT 30; BUN 18; Creatinine,  Ser 0.90; Hemoglobin 13.5; Platelets 244; Potassium 4.3; Sodium 136  Recent Lipid Panel    Component Value Date/Time   CHOL 154 05/18/2022 1027   TRIG 207 (H) 05/18/2022 1027   HDL 45 05/18/2022 1027   CHOLHDL 3.4 05/18/2022 1027   CHOLHDL 2.6 10/29/2017 0939   VLDL 27 10/29/2017 0939   LDLCALC 75 05/18/2022 1027    Physical Exam:    VS:  BP 128/86   Pulse 87   Ht 6\' 2"  (1.88 m)   Wt 220 lb 3.2 oz (99.9 kg)   SpO2 95%   BMI 28.27 kg/m     Wt Readings from Last 3 Encounters:  11/23/22 220 lb 3.2 oz (99.9 kg)  09/22/22 231 lb (104.8 kg)  07/14/22 239 lb (108.4 kg)     GEN:  Well nourished, well developed in no acute distress HEENT: Normal NECK: No JVD; No carotid bruits LYMPHATICS: No lymphadenopathy CARDIAC: RRR, no murmurs, rubs, gallops RESPIRATORY:  Clear to auscultation without rales, wheezing or rhonchi  ABDOMEN: Soft, non-tender, non-distended MUSCULOSKELETAL: tr edema; No deformity  SKIN: Warm and dry NEUROLOGIC:  Alert and oriented x 3 PSYCHIATRIC:  Normal affect   ASSESSMENT:    1. Chronic systolic heart failure (HCC)   2. Coronary artery disease of native artery of native heart with stable angina pectoris (HCC)   3. LBBB (left bundle branch block)   4. Hyperlipidemia LDL goal <70   5. S/P CABG x 4         PLAN:    In order of problems listed above:  CAD s/p CABG 2019: Abnormal Myoview with large inferolateral scar and anteroapical ischemia Aug 2022.  Cardiac cath showed patent grafts except SVG to ramus which was occluded. More recent admission in June 2023 with stenting of native ramus intermediate. Now on ASA only. No significant angina.  Hyperlipidemia: On Repatha. Statin intolerant. Last LDL 75.   Acute on Chronic combined systolic/diastolic  heart failure: Last EF by Echo was 40% and is s/p BiV ICD. On Coreg and valsartan at low doses. Entresto resulted in low  BPs. Unable to afford Jardiance.  Right heart cath normal right heart and LV  filling pressures. Normal cardiac output. Continue lasix  20 mg daily  Status post biventricular ICD: Followed by EP. Getting close to ERI.   GERD  6.   Lumbar disc disease. S/p surgery  7.  S/p right TKR    Medication Adjustments/Labs and Tests Ordered: Current medicines are reviewed at length with the patient today.  Concerns regarding medicines are outlined above.  No orders of the defined types were placed in this encounter.   No orders of the defined types were placed in this encounter.    Patient Instructions  Medication Instructions:  Continue same medications *If you need a refill on your cardiac medications before your next appointment, please call your pharmacy*   Lab Work: None ordered   Testing/Procedures: None ordered   Follow-Up: At [facility], you and your health needs are our priority.  As part of our continuing mission to provide you with exceptional heart care, we have created designated Provider Care Teams.  These Care Teams include your primary Cardiologist (physician) and Advanced Practice Providers (APPs -  Physician Assistants and Nurse Practitioners) who all work together to provide you with the care you need, when you need it.  We recommend signing up for the patient portal called "MyChart".  Sign up information is provided on this After Visit Summary.  MyChart is used to connect with patients for Virtual Visits (Telemedicine).  Patients are able to view lab/test results, encounter notes, upcoming appointments, etc.  Non-urgent messages can be sent to your provider as well.   To learn more about what you can do with MyChart, go to [email or web address].    Your next appointment:  6 months    Call in Feb to schedule May appointment     Provider:  Dr.Irelynd Zumstein   Follow up in 6 months  Signed, Joni Norrod [place], MD  11/23/2022 9:28 AM     [facility]

## 2022-11-23 ENCOUNTER — Encounter: Payer: Self-pay | Admitting: Cardiology

## 2022-11-23 ENCOUNTER — Ambulatory Visit: Payer: PPO | Attending: Cardiology | Admitting: Cardiology

## 2022-11-23 VITALS — BP 128/86 | HR 87 | Ht 74.0 in | Wt 220.2 lb

## 2022-11-23 DIAGNOSIS — G720 Drug-induced myopathy: Secondary | ICD-10-CM

## 2022-11-23 DIAGNOSIS — T466X5D Adverse effect of antihyperlipidemic and antiarteriosclerotic drugs, subsequent encounter: Secondary | ICD-10-CM

## 2022-11-23 DIAGNOSIS — I5022 Chronic systolic (congestive) heart failure: Secondary | ICD-10-CM | POA: Diagnosis not present

## 2022-11-23 DIAGNOSIS — I25118 Atherosclerotic heart disease of native coronary artery with other forms of angina pectoris: Secondary | ICD-10-CM | POA: Diagnosis not present

## 2022-11-23 DIAGNOSIS — Z951 Presence of aortocoronary bypass graft: Secondary | ICD-10-CM

## 2022-11-23 DIAGNOSIS — I447 Left bundle-branch block, unspecified: Secondary | ICD-10-CM

## 2022-11-23 DIAGNOSIS — T466X5A Adverse effect of antihyperlipidemic and antiarteriosclerotic drugs, initial encounter: Secondary | ICD-10-CM

## 2022-11-23 DIAGNOSIS — E785 Hyperlipidemia, unspecified: Secondary | ICD-10-CM

## 2022-11-23 MED ORDER — FUROSEMIDE 40 MG PO TABS: 20.0000 mg | ORAL_TABLET | Freq: Every day | ORAL | 3 refills | Status: DC

## 2022-11-23 NOTE — Patient Instructions (Signed)

## 2022-11-24 NOTE — Progress Notes (Signed)
Remote ICD transmission.   

## 2022-12-09 ENCOUNTER — Other Ambulatory Visit: Payer: Self-pay | Admitting: Sports Medicine

## 2022-12-09 NOTE — Telephone Encounter (Signed)
Patient is requesting a refill of the following medications: Requested Prescriptions   Pending Prescriptions Disp Refills   zolpidem (AMBIEN) 10 MG tablet [Pharmacy Med Name: zolpidem 10 mg tablet] 30 tablet 0    Sig: TAKE 1 TABLET BY MOUTH AT BEDTIME    Date of last refill: 11/18/22  Refill amount: 0  Treatment agreement date: unknown

## 2022-12-10 DIAGNOSIS — M25561 Pain in right knee: Secondary | ICD-10-CM | POA: Diagnosis not present

## 2022-12-10 DIAGNOSIS — Z96651 Presence of right artificial knee joint: Secondary | ICD-10-CM | POA: Diagnosis not present

## 2022-12-10 DIAGNOSIS — G8929 Other chronic pain: Secondary | ICD-10-CM | POA: Diagnosis not present

## 2022-12-14 ENCOUNTER — Ambulatory Visit: Payer: PPO

## 2022-12-14 DIAGNOSIS — I5022 Chronic systolic (congestive) heart failure: Secondary | ICD-10-CM

## 2022-12-14 DIAGNOSIS — I447 Left bundle-branch block, unspecified: Secondary | ICD-10-CM

## 2022-12-14 LAB — CUP PACEART REMOTE DEVICE CHECK
Battery Remaining Longevity: 5 mo
Battery Voltage: 2.8 V
Brady Statistic AP VP Percent: 0.05 %
Brady Statistic AP VS Percent: 0.01 %
Brady Statistic AS VP Percent: 99.59 %
Brady Statistic AS VS Percent: 0.34 %
Brady Statistic RA Percent Paced: 0.06 %
Brady Statistic RV Percent Paced: 98.91 %
Date Time Interrogation Session: 20241209001604
HighPow Impedance: 94 Ohm
Implantable Lead Connection Status: 753985
Implantable Lead Connection Status: 753985
Implantable Lead Connection Status: 753985
Implantable Lead Implant Date: 20190610
Implantable Lead Implant Date: 20200306
Implantable Lead Implant Date: 20200306
Implantable Lead Location: 753858
Implantable Lead Location: 753859
Implantable Lead Location: 753860
Implantable Lead Model: 5071
Implantable Lead Model: 5076
Implantable Pulse Generator Implant Date: 20200306
Lead Channel Impedance Value: 323 Ohm
Lead Channel Impedance Value: 399 Ohm
Lead Channel Impedance Value: 4047 Ohm
Lead Channel Impedance Value: 4047 Ohm
Lead Channel Impedance Value: 437 Ohm
Lead Channel Impedance Value: 513 Ohm
Lead Channel Pacing Threshold Amplitude: 0.625 V
Lead Channel Pacing Threshold Amplitude: 0.625 V
Lead Channel Pacing Threshold Amplitude: 1.875 V
Lead Channel Pacing Threshold Pulse Width: 0.4 ms
Lead Channel Pacing Threshold Pulse Width: 0.4 ms
Lead Channel Pacing Threshold Pulse Width: 1 ms
Lead Channel Sensing Intrinsic Amplitude: 31.625 mV
Lead Channel Sensing Intrinsic Amplitude: 31.625 mV
Lead Channel Sensing Intrinsic Amplitude: 4.125 mV
Lead Channel Sensing Intrinsic Amplitude: 4.125 mV
Lead Channel Setting Pacing Amplitude: 1.5 V
Lead Channel Setting Pacing Amplitude: 2.5 V
Lead Channel Setting Pacing Amplitude: 3 V
Lead Channel Setting Pacing Pulse Width: 0.4 ms
Lead Channel Setting Pacing Pulse Width: 1 ms
Lead Channel Setting Sensing Sensitivity: 0.6 mV
Zone Setting Status: 755011
Zone Setting Status: 755011

## 2022-12-21 ENCOUNTER — Other Ambulatory Visit: Payer: Self-pay | Admitting: Sports Medicine

## 2022-12-21 NOTE — Telephone Encounter (Signed)
Patient called and stated that he is overdue for his refill. Stated that he has been trying to get it since Friday and it was DENIED stating that it was too soon to refill.  Patient upset because he stated that it is NOT too soon and he has been without it.   Epic LR: 11/18/2022 No contract on File, added note to upcoming appointment.   Refill Pended and sent to Dr. Jacquenette Shone for approval.

## 2022-12-22 ENCOUNTER — Encounter: Payer: Self-pay | Admitting: Sports Medicine

## 2022-12-22 ENCOUNTER — Ambulatory Visit (INDEPENDENT_AMBULATORY_CARE_PROVIDER_SITE_OTHER): Payer: PPO | Admitting: Sports Medicine

## 2022-12-22 VITALS — BP 124/88 | HR 94 | Temp 96.8°F | Resp 16 | Ht 74.0 in | Wt 224.6 lb

## 2022-12-22 DIAGNOSIS — G8929 Other chronic pain: Secondary | ICD-10-CM

## 2022-12-22 DIAGNOSIS — R0981 Nasal congestion: Secondary | ICD-10-CM | POA: Diagnosis not present

## 2022-12-22 DIAGNOSIS — H6690 Otitis media, unspecified, unspecified ear: Secondary | ICD-10-CM | POA: Diagnosis not present

## 2022-12-22 DIAGNOSIS — F5101 Primary insomnia: Secondary | ICD-10-CM | POA: Diagnosis not present

## 2022-12-22 DIAGNOSIS — M25561 Pain in right knee: Secondary | ICD-10-CM | POA: Diagnosis not present

## 2022-12-22 DIAGNOSIS — I10 Essential (primary) hypertension: Secondary | ICD-10-CM

## 2022-12-22 MED ORDER — ZOLPIDEM TARTRATE 10 MG PO TABS: 10.0000 mg | ORAL_TABLET | Freq: Every day | ORAL | 0 refills | Status: DC

## 2022-12-22 MED ORDER — AMOXICILLIN-POT CLAVULANATE 500-125 MG PO TABS: 1.0000 | ORAL_TABLET | Freq: Two times a day (BID) | ORAL | 0 refills | Status: DC

## 2022-12-22 MED ORDER — FLUTICASONE PROPIONATE 50 MCG/ACT NA SUSP: 2.0000 | Freq: Every day | NASAL | 6 refills | Status: DC

## 2022-12-22 MED ORDER — FEXOFENADINE HCL 180 MG PO TABS: 180.0000 mg | ORAL_TABLET | Freq: Every day | ORAL | 0 refills | Status: DC

## 2022-12-22 NOTE — Progress Notes (Signed)
Careteam: Patient Care Team: Venita Sheffield, MD as PCP - General (Internal Medicine) [place], Peter M, MD as PCP - Cardiology (Cardiology) [name], MD as PCP - Electrophysiology (Cardiology) Verner Chol, [facility] (Inactive) as Pharmacist (Pharmacist)  PLACE OF SERVICE:  [facility]  Advanced Directive information Does Patient Have a Medical Advance Directive?: No, Would patient like information on creating a medical advance directive?: No - Patient declined  Allergies  Allergen Reactions   Ezetimibe Other (See Comments)    Joint pain and drops BP   Niacin Other (See Comments)    Drops BP and severe joint/muscle pain   Statins Other (See Comments)    Severe joint pain and drops BP.    Chief Complaint  Patient presents with   Medical Management of Chronic Issues    3 month follow up.   Health Maintenance    Discuss the need for Covid Booster.     HPI: Patient is a 67 y.o. male with PMH  of CAD s/p CABG, hypertension, left bundle branch block, hyperlipidemia, and chronic systolic heart failure.   Insomnia Reports problem going to sleep and maintaining sleep  Denies drinking coffee, day time naps Pt is currently on ambien 10 mg  Tried melatonin, trazodone, belsorma in the past   CAD  Follows with cardiology   HLD  On repatha  Denies muscle cramps   Chronic Diastolic heart failure Denies chest pain, palpitations, SOB, dizzy or lightheaded Echo 40 % EF  As per cardiology note pt could not tolerate entresto due to low bp readings  Could not afford jardiance  Pt states he had blood work done by his cardiology office last month, I am unable to see ay results from last month Pt stated that he would call cardiology office for the results Informed patient that if the blood work from cardiology office is wnl, he does not need repeat blood work.    Rt knee pain  S/p TKR  Pt states that he is able to ambulate and doing much better regarding pain  He has  an appt thsi Thursday again with them  Nasal congestion Pt c/o nasal congestion with post nasal drip since Saturday  Did covid test which came negative C/o bil ear pain with discharge  Denies fevers, sinus pain, Minimal cough in the morning, trying to clear his throat     Review of Systems:  Review of Systems  Constitutional:  Negative for chills and fever.  HENT:  Positive for congestion, ear discharge and ear pain. Negative for hearing loss, sinus pain and sore throat.   Eyes:  Negative for double vision.  Respiratory:  Negative for cough, sputum production and shortness of breath.   Cardiovascular:  Negative for chest pain, palpitations and leg swelling.  Gastrointestinal:  Negative for abdominal pain, heartburn and nausea.  Genitourinary:  Negative for dysuria, frequency and hematuria.  Musculoskeletal:  Positive for joint pain. Negative for falls and myalgias.  Neurological:  Negative for dizziness, sensory change and focal weakness.  Psychiatric/Behavioral:  The patient has insomnia.    Negative unless indicated in HPI.   Past Medical History:  Diagnosis Date   AICD (automatic cardioverter/defibrillator) present    Anxiety    Arthritis    "mild; back, neck, spine" (06/09/2017)   CHF (congestive heart failure) (HCC)    Chronic back pain    Coronary artery disease    GERD (gastroesophageal reflux disease)    History of gout    History of  kidney stones X 2   Hypercholesterolemia    Hypertension    LBBB (left bundle branch block)    MI (myocardial infarction) (HCC) 1993   LATERAL   MI (myocardial infarction) (HCC) ?09/2001; 06/07/2017   Pneumonia    "several times" (06/09/2017)   Pre-diabetes    S/P coronary artery stent placement    RCA   Varicose veins    Past Surgical History:  Procedure Laterality Date   ANTERIOR CERVICAL DECOMP/DISCECTOMY FUSION  03/03/2011   Procedure: ANTERIOR CERVICAL DECOMPRESSION/DISCECTOMY FUSION 3 LEVELS;  Surgeon: Karn Cassis, MD;   Location: [facility];  Service: Neurosurgery;  Laterality: N/A;  Cervical three-four Cervical four-five Cervical five-six Anterior cervical decompression/diskectomy, fusion   APPLICATION OF ROBOTIC ASSISTANCE FOR SPINAL PROCEDURE N/A 10/03/2020   Procedure: APPLICATION OF ROBOTIC ASSISTANCE FOR SPINAL PROCEDURE;  Surgeon: Lisbeth Renshaw, MD;  Location: [facility] OR;  Service: Neurosurgery;  Laterality: N/A;   BACK SURGERY     CARDIAC CATHETERIZATION  2009   Stents in RCA patent. 70 to 80% PL, and 50% ostial PD.    CARDIAC CATHETERIZATION N/A 11/20/2014   Procedure: Left Heart Cath and Coronary Angiography;  Surgeon: Peter M [place]il[place], MD;  Location: [facility] INVASIVE CV LAB;  Service: Cardiovascular;  Laterality: N/A;   CORONARY ANGIOPLASTY     DIRECT ANGIOPLASTY THE MARGINAL BRANCH   CORONARY ANGIOPLASTY WITH STENT PLACEMENT     RCA   CORONARY ARTERY BYPASS GRAFT N/A 06/14/2017   Procedure: CORONARY ARTERY BYPASS GRAFTING (CABG) x four, using left internal mammary artery and right leg greater saphenous vein harvested endoscopically;  Surgeon: Kerin Perna, MD;  Location: [facility] OR;  Service: Open Heart Surgery;  Laterality: N/A;   CORONARY PRESSURE/FFR STUDY N/A 06/24/2021   Procedure: INTRAVASCULAR PRESSURE WIRE/FFR STUDY;  Surgeon: Marykay Lex, MD;  Location: [facility] INVASIVE CV LAB;  Service: Cardiovascular;  Laterality: N/A;   CORONARY STENT INTERVENTION N/A 06/24/2021   Procedure: CORONARY STENT INTERVENTION;  Surgeon: Marykay Lex, MD;  Location: [facility] INVASIVE CV LAB;  Service: Cardiovascular;  Laterality: N/A;   CYSTOSCOPY N/A 06/14/2017   Procedure: CYSTOSCOPY;  Surgeon: Kerin Perna, MD;  Location: [facility] OR;  Service: Open Heart Surgery;  Laterality: N/A;   ICD IMPLANT N/A 03/11/2018   Procedure: ICD IMPLANT - Dual Chamber;  Surgeon: [name], MD;  Location: [facility] INVASIVE CV LAB;  Service: Cardiovascular;  Laterality: N/A;   INSERTION OF SUPRAPUBIC CATHETER N/A 06/14/2017   Procedure:  INSERTION OF SUPRAPUBIC CATHETER - Lower abdomen;  Surgeon: Kerin Perna, MD;  Location: [facility] OR;  Service: Open Heart Surgery;  Laterality: N/A;   POSTERIOR LUMBAR FUSION  10/2011   RIGHT/LEFT HEART CATH AND CORONARY ANGIOGRAPHY N/A 06/08/2017   Procedure: RIGHT/LEFT HEART CATH AND CORONARY ANGIOGRAPHY;  Surgeon: [place], Peter M, MD;  Location: [facility] INVASIVE CV LAB;  Service: Cardiovascular;  Laterality: N/A;   RIGHT/LEFT HEART CATH AND CORONARY/GRAFT ANGIOGRAPHY N/A 09/06/2020   Procedure: RIGHT/LEFT HEART CATH AND CORONARY/GRAFT ANGIOGRAPHY;  Surgeon: [place], Peter M, MD;  Location: [facility] INVASIVE CV LAB;  Service: Cardiovascular;  Laterality: N/A;   RIGHT/LEFT HEART CATH AND CORONARY/GRAFT ANGIOGRAPHY N/A 06/24/2021   Procedure: RIGHT/LEFT HEART CATH AND CORONARY/GRAFT ANGIOGRAPHY;  Surgeon: Marykay Lex, MD;  Location: [facility] INVASIVE CV LAB;  Service: Cardiovascular;  Laterality: N/A;   TEE WITHOUT CARDIOVERSION N/A 06/14/2017   Procedure: TRANSESOPHAGEAL ECHOCARDIOGRAM (TEE);  Surgeon: Donata Clay, Theron Arista, MD;  Location: [facility] OR;  Service: Open Heart Surgery;  Laterality: N/A;  TOTAL KNEE ARTHROPLASTY Right 12/17/2021   Procedure: TOTAL KNEE ARTHROPLASTY;  Surgeon: Eugenia Mcalpine, MD;  Location: [facility] ORS;  Service: Orthopedics;  Laterality: Right;  adductor canal  120   URETHROPLASTY N/A 10/15/2017   Procedure: URETHROPLASTY WITH BUCCAL GRAFT HARVAST;  Surgeon: Crist Fat, MD;  Location: [facility] ORS;  Service: Urology;  Laterality: N/A;   Social History:   reports that he quit smoking about 23 years ago. His smoking use included cigarettes. He started smoking about 43 years ago. He has a 40 pack-year smoking history. He has never used smokeless tobacco. He reports that he does not currently use alcohol. He reports that he does not use drugs.  Family History  Problem Relation Age of Onset   Breast cancer Mother    Coronary artery disease Mother    Stroke Father    Heart attack Father    Diabetes  Father    Heart attack Brother    Heart disease Brother    Heart attack Maternal Grandfather     Medications: Patient's Medications  New Prescriptions   No medications on file  Previous Medications   ACETAMINOPHEN (TYLENOL) 500 MG TABLET    Take 1,000 mg by mouth 3 (three) times daily. Pt. Reports he is taking half   ASCORBIC ACID (SUPER C COMPLEX PO)    Take 1 tablet by mouth 3 (three) times daily.   ASPIRIN 81 MG EC TABLET    Take 81 mg by mouth in the morning.   CARVEDILOL (COREG) 6.25 MG TABLET    TAKE 1 TABLET BY MOUTH 2 TIMES DAILY WITH A meal **Must have office visit FOR additional refills**   CETIRIZINE (ZYRTEC) 10 MG TABLET    Take 10 mg by mouth daily as needed for allergies.   FUROSEMIDE (LASIX) 40 MG TABLET    Take 0.5 tablets (20 mg total) by mouth daily.   MISC NATURAL PRODUCTS (DEEP SLEEP) CAPS    Take 2 capsules by mouth at bedtime.   MULTIPLE VITAMINS-MINERALS (AIRBORNE GUMMIES PO)    Take 3 capsules by mouth 2 (two) times daily.   NITROGLYCERIN (NITROSTAT) 0.4 MG SL TABLET    Place 1 tablet (0.4 mg total) under the tongue every 5 (five) minutes x 3 doses as needed for chest pain.   PANTOPRAZOLE (PROTONIX) 40 MG TABLET    Take 1 tablet (40 mg total) by mouth daily.   REPATHA SURECLICK 140 MG/ML SOAJ    INJECT into THE SKIN EVERY 14 DAYS   VALSARTAN (DIOVAN) 40 MG TABLET    TAKE 1 TABLET BY MOUTH EVERY DAY   ZOLPIDEM (AMBIEN) 10 MG TABLET    Take 1 tablet (10 mg total) by mouth at bedtime.  Modified Medications   No medications on file  Discontinued Medications   No medications on file    Physical Exam: Vitals:   12/22/22 1055  BP: 124/88  Pulse: 94  Resp: 16  Temp: (!) 96.8 F (36 C)  SpO2: 96%  Weight: 224 lb 9.6 oz (101.9 kg)  Height: 6\' 2"  (1.88 m)   Body mass index is 28.84 kg/m. BP Readings from Last 3 Encounters:  12/22/22 124/88  11/23/22 128/86  09/22/22 130/70   Wt Readings from Last 3 Encounters:  12/22/22 224 lb 9.6 oz (101.9 kg)   11/23/22 220 lb 3.2 oz (99.9 kg)  09/22/22 231 lb (104.8 kg)    Physical Exam Constitutional:      Appearance: Normal appearance.  HENT:  Head: Normocephalic and atraumatic.     Ears:     Comments: Rt TM - bulging with yellowish discoloration of TM     Mouth/Throat:     Pharynx: Posterior oropharyngeal erythema present.     Comments: No sinus tenderness Cardiovascular:     Rate and Rhythm: Normal rate and regular rhythm.     Pulses: Normal pulses.     Heart sounds: Normal heart sounds.  Pulmonary:     Effort: No respiratory distress.     Breath sounds: No stridor. No wheezing or rales.  Abdominal:     General: Bowel sounds are normal. There is no distension.     Palpations: Abdomen is soft.     Tenderness: There is no abdominal tenderness. There is no right CVA tenderness or guarding.  Musculoskeletal:        General: No swelling.     Comments: Rt knee - scar from TKR  Pre patellar swelling  No erythema , minimal warmth + Rom intact   Neurological:     Mental Status: He is alert. Mental status is at baseline.     Sensory: No sensory deficit.     Motor: No weakness.     Labs reviewed: Basic Metabolic Panel: Recent Labs    05/01/22 1615  NA 136  K 4.3  CL 98  CO2 27  GLUCOSE 100*  BUN 18  CREATININE 0.90  CALCIUM 9.1   Liver Function Tests: Recent Labs    05/01/22 1615  AST 23  ALT 30  ALKPHOS 68  BILITOT 0.8  PROT 6.7  ALBUMIN 3.7   Recent Labs    05/01/22 1615  LIPASE 26   No results for input(s): "AMMONIA" in the last 8760 hours. CBC: Recent Labs    05/01/22 1615  WBC 7.3  HGB 13.5  HCT 41.4  MCV 100.0  PLT 244   Lipid Panel: Recent Labs    05/18/22 1027  CHOL 154  HDL 45  LDLCALC 75  TRIG 207*  CHOLHDL 3.4   TSH: No results for input(s): "TSH" in the last 8760 hours. A1C: Lab Results  Component Value Date   HGBA1C 6.4 (H) 12/08/2021     Assessment/Plan  1. Primary insomnia (Primary)  Discussed regarding sleep  hygiene Refilled ambien Controlled substance contract done in the clinic today  2. Chronic pain of right knee Follows with ortho  3. Acute otitis media, unspecified otitis media type Pt c/o Pain in both ears  Will send augmentin to pharmacy  4. Nasal congestion No sinus tenderness - fluticasone (FLONASE) 50 MCG/ACT nasal spray; Place 2 sprays into both nostrils daily.  Dispense: 16 g; Refill: 6 - fexofenadine (ALLEGRA ALLERGY) 180 MG tablet; Take 1 tablet (180 mg total) by mouth daily.  Dispense: 30 tablet; Refill: 0  5. Primary hypertension   at goal  Cont with the same     Other orders - amoxicillin-clavulanate (AUGMENTIN) 500-125 MG tablet; Take 1 tablet by mouth in the morning and at bedtime.  Dispense: 10 tablet; Refill: 0 - zolpidem (AMBIEN) 10 MG tablet; Take 1 tablet (10 mg total) by mouth at bedtime.  Dispense: 30 tablet; Refill: 0   No follow-ups on file.:    42 minTotal time spent for obtaining history,  performing a medically appropriate examination and evaluation, reviewing the tests,ordering  tests,  documenting clinical information in the electronic or other health record, independently interpreting results ,care coordination (not separately reported)

## 2022-12-22 NOTE — Telephone Encounter (Signed)
Contract done during the visit today  Refilled [place]

## 2022-12-24 DIAGNOSIS — M7041 Prepatellar bursitis, right knee: Secondary | ICD-10-CM | POA: Diagnosis not present

## 2023-01-04 DIAGNOSIS — M7041 Prepatellar bursitis, right knee: Secondary | ICD-10-CM | POA: Diagnosis not present

## 2023-01-04 DIAGNOSIS — Z96651 Presence of right artificial knee joint: Secondary | ICD-10-CM | POA: Diagnosis not present

## 2023-01-11 DIAGNOSIS — M7041 Prepatellar bursitis, right knee: Secondary | ICD-10-CM | POA: Diagnosis not present

## 2023-01-14 ENCOUNTER — Ambulatory Visit (INDEPENDENT_AMBULATORY_CARE_PROVIDER_SITE_OTHER): Payer: PPO

## 2023-01-14 DIAGNOSIS — I447 Left bundle-branch block, unspecified: Secondary | ICD-10-CM

## 2023-01-14 LAB — CUP PACEART REMOTE DEVICE CHECK
Battery Remaining Longevity: 5 mo
Battery Voltage: 2.8 V
Brady Statistic AP VP Percent: 0.11 %
Brady Statistic AP VS Percent: 0.02 %
Brady Statistic AS VP Percent: 99.4 %
Brady Statistic AS VS Percent: 0.47 %
Brady Statistic RA Percent Paced: 0.13 %
Brady Statistic RV Percent Paced: 98.76 %
Date Time Interrogation Session: 20250109022504
HighPow Impedance: 93 Ohm
Implantable Lead Connection Status: 753985
Implantable Lead Connection Status: 753985
Implantable Lead Connection Status: 753985
Implantable Lead Implant Date: 20190610
Implantable Lead Implant Date: 20200306
Implantable Lead Implant Date: 20200306
Implantable Lead Location: 753858
Implantable Lead Location: 753859
Implantable Lead Location: 753860
Implantable Lead Model: 5071
Implantable Lead Model: 5076
Implantable Pulse Generator Implant Date: 20200306
Lead Channel Impedance Value: 323 Ohm
Lead Channel Impedance Value: 399 Ohm
Lead Channel Impedance Value: 399 Ohm
Lead Channel Impedance Value: 4047 Ohm
Lead Channel Impedance Value: 4047 Ohm
Lead Channel Impedance Value: 513 Ohm
Lead Channel Pacing Threshold Amplitude: 0.625 V
Lead Channel Pacing Threshold Amplitude: 0.625 V
Lead Channel Pacing Threshold Amplitude: 1.875 V
Lead Channel Pacing Threshold Pulse Width: 0.4 ms
Lead Channel Pacing Threshold Pulse Width: 0.4 ms
Lead Channel Pacing Threshold Pulse Width: 1 ms
Lead Channel Sensing Intrinsic Amplitude: 31.625 mV
Lead Channel Sensing Intrinsic Amplitude: 31.625 mV
Lead Channel Sensing Intrinsic Amplitude: 4.625 mV
Lead Channel Sensing Intrinsic Amplitude: 4.625 mV
Lead Channel Setting Pacing Amplitude: 1.5 V
Lead Channel Setting Pacing Amplitude: 2.5 V
Lead Channel Setting Pacing Amplitude: 3 V
Lead Channel Setting Pacing Pulse Width: 0.4 ms
Lead Channel Setting Pacing Pulse Width: 1 ms
Lead Channel Setting Sensing Sensitivity: 0.6 mV
Zone Setting Status: 755011
Zone Setting Status: 755011

## 2023-01-18 ENCOUNTER — Other Ambulatory Visit: Payer: Self-pay | Admitting: Sports Medicine

## 2023-01-19 NOTE — Telephone Encounter (Signed)
 Patient is requesting a refill of the following medications: Requested Prescriptions   Pending Prescriptions Disp Refills   zolpidem  (AMBIEN ) 10 MG tablet [Pharmacy Med Name: zolpidem  10 mg tablet] 30 tablet 0    Sig: TAKE 1 TABLET BY MOUTH AT BEDTIME    Date of last refill:12/1722  Refill amount: 30 tabs with no refill.  Treatment agreement date: 12/24/22

## 2023-01-21 DIAGNOSIS — M7041 Prepatellar bursitis, right knee: Secondary | ICD-10-CM | POA: Diagnosis not present

## 2023-02-01 DIAGNOSIS — M7062 Trochanteric bursitis, left hip: Secondary | ICD-10-CM | POA: Diagnosis not present

## 2023-02-01 DIAGNOSIS — M7061 Trochanteric bursitis, right hip: Secondary | ICD-10-CM | POA: Diagnosis not present

## 2023-02-05 ENCOUNTER — Telehealth: Payer: Self-pay

## 2023-02-05 ENCOUNTER — Other Ambulatory Visit (HOSPITAL_COMMUNITY): Payer: Self-pay

## 2023-02-05 NOTE — Telephone Encounter (Signed)
Pharmacy Patient Advocate Encounter   Received notification from CoverMyMeds that prior authorization for REPATHA is required/requested.   Insurance verification completed.   The patient is insured through [employer] [facility] .   Per test claim: Refill too soon. PA is not needed at this time. Medication was filled 02/01/23. Next eligible fill date is 02/22/23.

## 2023-02-22 ENCOUNTER — Other Ambulatory Visit: Payer: Self-pay | Admitting: Sports Medicine

## 2023-02-22 NOTE — Telephone Encounter (Signed)
Patient has request refill on medication Ambien. Patient medication last refilled 01/19/2023. Patient has Non Opioid Contract on file dated 12/24/2022. Patient medication pend and sent to PCP Venita Sheffield, MD for approval.

## 2023-02-23 NOTE — Progress Notes (Signed)
 Remote ICD transmission.

## 2023-02-26 ENCOUNTER — Other Ambulatory Visit: Payer: Self-pay | Admitting: Family Medicine

## 2023-03-01 DIAGNOSIS — G8929 Other chronic pain: Secondary | ICD-10-CM | POA: Diagnosis not present

## 2023-03-01 DIAGNOSIS — M25659 Stiffness of unspecified hip, not elsewhere classified: Secondary | ICD-10-CM | POA: Diagnosis not present

## 2023-03-01 DIAGNOSIS — M7071 Other bursitis of hip, right hip: Secondary | ICD-10-CM | POA: Diagnosis not present

## 2023-03-01 DIAGNOSIS — M7072 Other bursitis of hip, left hip: Secondary | ICD-10-CM | POA: Diagnosis not present

## 2023-03-01 DIAGNOSIS — R198 Other specified symptoms and signs involving the digestive system and abdomen: Secondary | ICD-10-CM | POA: Diagnosis not present

## 2023-03-01 DIAGNOSIS — M545 Low back pain, unspecified: Secondary | ICD-10-CM | POA: Diagnosis not present

## 2023-03-08 ENCOUNTER — Telehealth: Payer: Self-pay | Admitting: Cardiology

## 2023-03-08 DIAGNOSIS — J90 Pleural effusion, not elsewhere classified: Secondary | ICD-10-CM | POA: Diagnosis not present

## 2023-03-08 DIAGNOSIS — J9811 Atelectasis: Secondary | ICD-10-CM | POA: Diagnosis not present

## 2023-03-08 DIAGNOSIS — R067 Sneezing: Secondary | ICD-10-CM | POA: Diagnosis not present

## 2023-03-08 DIAGNOSIS — I5022 Chronic systolic (congestive) heart failure: Secondary | ICD-10-CM | POA: Diagnosis not present

## 2023-03-08 DIAGNOSIS — I2510 Atherosclerotic heart disease of native coronary artery without angina pectoris: Secondary | ICD-10-CM | POA: Diagnosis not present

## 2023-03-08 DIAGNOSIS — J811 Chronic pulmonary edema: Secondary | ICD-10-CM | POA: Diagnosis not present

## 2023-03-08 DIAGNOSIS — E7800 Pure hypercholesterolemia, unspecified: Secondary | ICD-10-CM | POA: Diagnosis not present

## 2023-03-08 DIAGNOSIS — R052 Subacute cough: Secondary | ICD-10-CM | POA: Diagnosis not present

## 2023-03-08 DIAGNOSIS — Z87891 Personal history of nicotine dependence: Secondary | ICD-10-CM | POA: Diagnosis not present

## 2023-03-08 DIAGNOSIS — R0981 Nasal congestion: Secondary | ICD-10-CM | POA: Diagnosis not present

## 2023-03-08 DIAGNOSIS — I110 Hypertensive heart disease with heart failure: Secondary | ICD-10-CM | POA: Diagnosis not present

## 2023-03-08 NOTE — Telephone Encounter (Signed)
 Spoke to patient he stated he has been coughing frequently white phlegm.Swelling in both ankles.Sob with exertion for the past 1 week. He saw PCP today.CXR revealed fluid around lungs.He was told to call Dr.Jordan.Advised Dr.Jordan is out of office this week.Advised ok to double Lasix dose for 3 days only then return to normal dose.Appointment scheduled with Edd Fabian NP 3/7 at 1:55 pm.I will make Dr.Jordan aware.

## 2023-03-08 NOTE — Telephone Encounter (Signed)
 Pt called in stating he had a CT this morning that showed fluid around his lungs. Pt also noticed some swelling in ankles.   Pt c/o swelling/edema: STAT if pt has developed SOB within 24 hours  If swelling, where is the swelling located? Ankles   How much weight have you gained and in what time span? Nothing gained   Have you gained 2 pounds in a day or 5 pounds in a week? No   Do you have a log of your daily weights (if so, list)? Not taken   Are you currently taking a fluid pill? No   Are you currently SOB? No SOB currently but states he has had some with exertion   Have you traveled recently in a car or plane for an extended period of time? No

## 2023-03-10 NOTE — Progress Notes (Signed)
 Cardiology Clinic Note   Patient Name: [name] Date of Encounter: 03/12/2023  Primary Care Provider:  Venita Sheffield, MD Primary Cardiologist:  Peter Swaziland, MD  Patient Profile    [name] 68 year old male presents the clinic today for follow-up evaluation of his lower extremity swelling and increased shortness of breath.  Past Medical History    Past Medical History:  Diagnosis Date   AICD (automatic cardioverter/defibrillator) present    Anxiety    Arthritis    "mild; back, neck, spine" (06/09/2017)   CHF (congestive heart failure) (HCC)    Chronic back pain    Coronary artery disease    GERD (gastroesophageal reflux disease)    History of gout    History of kidney stones X 2   Hypercholesterolemia    Hypertension    LBBB (left bundle branch block)    MI (myocardial infarction) (HCC) 1993   LATERAL   MI (myocardial infarction) (HCC) ?09/2001; 06/07/2017   Pneumonia    "several times" (06/09/2017)   Pre-diabetes    S/P coronary artery stent placement    RCA   Varicose veins    Past Surgical History:  Procedure Laterality Date   ANTERIOR CERVICAL DECOMP/DISCECTOMY FUSION  03/03/2011   Procedure: ANTERIOR CERVICAL DECOMPRESSION/DISCECTOMY FUSION 3 LEVELS;  Surgeon: Karn Cassis, MD;  Location: [facility];  Service: Neurosurgery;  Laterality: N/A;  Cervical three-four Cervical four-five Cervical five-six Anterior cervical decompression/diskectomy, fusion   APPLICATION OF ROBOTIC ASSISTANCE FOR SPINAL PROCEDURE N/A 10/03/2020   Procedure: APPLICATION OF ROBOTIC ASSISTANCE FOR SPINAL PROCEDURE;  Surgeon: Lisbeth Renshaw, MD;  Location: [facility] OR;  Service: Neurosurgery;  Laterality: N/A;   BACK SURGERY     CARDIAC CATHETERIZATION  2009   Stents in RCA patent. 70 to 80% PL, and 50% ostial PD.    CARDIAC CATHETERIZATION N/A 11/20/2014   Procedure: Left Heart Cath and Coronary Angiography;  Surgeon: Peter M Swaziland, MD;  Location: [facility] INVASIVE CV LAB;   Service: Cardiovascular;  Laterality: N/A;   CORONARY ANGIOPLASTY     DIRECT ANGIOPLASTY THE MARGINAL BRANCH   CORONARY ANGIOPLASTY WITH STENT PLACEMENT     RCA   CORONARY ARTERY BYPASS GRAFT N/A 06/14/2017   Procedure: CORONARY ARTERY BYPASS GRAFTING (CABG) x four, using left internal mammary artery and right leg greater saphenous vein harvested endoscopically;  Surgeon: Kerin Perna, MD;  Location: [facility] OR;  Service: Open Heart Surgery;  Laterality: N/A;   CORONARY PRESSURE/FFR STUDY N/A 06/24/2021   Procedure: INTRAVASCULAR PRESSURE WIRE/FFR STUDY;  Surgeon: Marykay Lex, MD;  Location: [facility] INVASIVE CV LAB;  Service: Cardiovascular;  Laterality: N/A;   CORONARY STENT INTERVENTION N/A 06/24/2021   Procedure: CORONARY STENT INTERVENTION;  Surgeon: Marykay Lex, MD;  Location: [facility] INVASIVE CV LAB;  Service: Cardiovascular;  Laterality: N/A;   CYSTOSCOPY N/A 06/14/2017   Procedure: CYSTOSCOPY;  Surgeon: Kerin Perna, MD;  Location: [facility] OR;  Service: Open Heart Surgery;  Laterality: N/A;   ICD IMPLANT N/A 03/11/2018   Procedure: ICD IMPLANT - Dual Chamber;  Surgeon: [name], MD;  Location: [facility] INVASIVE CV LAB;  Service: Cardiovascular;  Laterality: N/A;   INSERTION OF SUPRAPUBIC CATHETER N/A 06/14/2017   Procedure: INSERTION OF SUPRAPUBIC CATHETER - Lower abdomen;  Surgeon: Kerin Perna, MD;  Location: [facility] OR;  Service: Open Heart Surgery;  Laterality: N/A;   POSTERIOR LUMBAR FUSION  10/2011   RIGHT/LEFT HEART CATH AND CORONARY ANGIOGRAPHY N/A 06/08/2017   Procedure:  RIGHT/LEFT HEART CATH AND CORONARY ANGIOGRAPHY;  Surgeon: Swaziland, Peter M, MD;  Location: [facility] INVASIVE CV LAB;  Service: Cardiovascular;  Laterality: N/A;   RIGHT/LEFT HEART CATH AND CORONARY/GRAFT ANGIOGRAPHY N/A 09/06/2020   Procedure: RIGHT/LEFT HEART CATH AND CORONARY/GRAFT ANGIOGRAPHY;  Surgeon: Swaziland, Peter M, MD;  Location: [facility] INVASIVE CV LAB;  Service: Cardiovascular;  Laterality: N/A;   RIGHT/LEFT HEART CATH AND  CORONARY/GRAFT ANGIOGRAPHY N/A 06/24/2021   Procedure: RIGHT/LEFT HEART CATH AND CORONARY/GRAFT ANGIOGRAPHY;  Surgeon: Marykay Lex, MD;  Location: [facility] INVASIVE CV LAB;  Service: Cardiovascular;  Laterality: N/A;   TEE WITHOUT CARDIOVERSION N/A 06/14/2017   Procedure: TRANSESOPHAGEAL ECHOCARDIOGRAM (TEE);  Surgeon: Donata Clay, Theron Arista, MD;  Location: [facility] OR;  Service: Open Heart Surgery;  Laterality: N/A;   TOTAL KNEE ARTHROPLASTY Right 12/17/2021   Procedure: TOTAL KNEE ARTHROPLASTY;  Surgeon: Eugenia Mcalpine, MD;  Location: [facility] ORS;  Service: Orthopedics;  Laterality: Right;  adductor canal  120   URETHROPLASTY N/A 10/15/2017   Procedure: URETHROPLASTY WITH BUCCAL GRAFT HARVAST;  Surgeon: Crist Fat, MD;  Location: [facility] ORS;  Service: Urology;  Laterality: N/A;    Allergies  Allergies  Allergen Reactions   Ezetimibe Other (See Comments)    Joint pain and drops BP   Niacin Other (See Comments)    Drops BP and severe joint/muscle pain   Statins Other (See Comments)    Severe joint pain and drops BP.    History of Present Illness    [name] has a PMH of coronary artery disease status post CABG, left bundle branch block, HLD, HTN, and chronic systolic CHF.  He was admitted 6/19 with flash pulmonary edema.  He was ruled in for NSTEMI and underwent LHC which showed severe three-vessel CAD.  He underwent CABG by Dr. Donata Clay with LIMA-LAD, SVG-diagonal, SVG-ramus intermediate, and SVG-RCA.  He had syncope 6/19 while waiting in the urology office due to urethral stricture.  He was noted to have wide-complex tachycardia on telemetry.  His heart rate was 170.  Echocardiogram showed an LVEF of 35-40 percent.  His lower extremity Dopplers were negative for DVT.  Chest CT was negative for PE.  He was seen by EP.  He was noted to have underlying rhythm which was either VT or NSVT.  He was fitted for LifeVest.  His medication was decreased due to mild hypotension and orthostasis.  Repeat  echocardiogram 9/19 showed low EF of 25-30%.  He was unable to tolerate Entresto, spironolactone and carvedilol due to orthostatic symptoms and lightheadedness.  He eventually underwent BiV ICD implant 3/20 by Dr. Graciela Husbands.  Repeat echocardiogram 7/20 showed significant improvement in his EF 40-45%.  He presented to the emergency department 06/23/2021 with complaints of shortness of breath.  He was hospitalized 06/23/2021 until 06/25/2021.  This was in the setting of acute heart failure exacerbation, coronary artery disease with anginal equivalent.  His cardiac troponins were mildly elevated.  His chest x-ray showed mild CHF.  He received IV diuresis.  His CTA chest was negative for PE.  He underwent cardiac catheterization 06/24/2021 which showed stable three-vessel CAD and known occluded SVG-ramus intermediate.  He had otherwise patent grafts.  He was noted to have progression of his proximal ramus intermedius lesion to 80% and positive FFR status post DES.  His right heart cath showed relatively normal pressures.  He followed up with Dr. Swaziland 11/23/2022.  During that time he was riding his bike 30 to 60 minutes/day.  He was going to  the gym 3-4 times per week.  He was also doing a lot of yard work.  He had no shortness of breath and or angina.  He still has some soreness at his ICD site.  He was unable to lay on his left side to sleep at night.  He had no lower extremity swelling.  He was taking only 20 mg of Lasix daily.  He contacted the nurse triage line on 03/08/2023.  He reported coughing up white phlegm, swelling in bilateral ankles, shortness of breath with exertion x 1 week and noted that he had followed up with his PCP.  Chest x-ray was ordered and showed fluid bilateral lungs.  He was instructed to increase his diuresis x 3 days and follow-up with cardiology.  He presents to the clinic today for evaluation and states he noted a week or so ago that he was having more increased work of breathing.  He was  placed on prednisone for his joint inflammation and reports increasing his Lasix to 40 mg for the last several days.  His weight today is 217 pounds.  He continues to have some generalized bilateral lower extremity nonpitting edema.  He reports he drinks about 7 bottles of water per day.  He tries to follow a low-sodium diet.  His EKG today shows a sensed V paced rhythm at 81 bpm.  His blood pressure initially was 140/84 and on recheck is 134/80.  I recommended that he reduce the amount of fluid that he is consuming.  Goal 80 ounces  per day.  Continue his low-sodium diet.  I will repeat his echocardiogram.  We reviewed [facility] and we will plan follow-up after echocardiogram in about 1-2 months.  Today he denies chest pain, increased shortness of breath,  fatigue, palpitations, melena, hematuria, hemoptysis, diaphoresis, weakness, presyncope, syncope, orthopnea, and PND.   Home Medications    Prior to Admission medications   Medication Sig Start Date End Date Taking? Authorizing Provider  acetaminophen (TYLENOL) 500 MG tablet Take 1,000 mg by mouth 3 (three) times daily. Pt. Reports he is taking half    [provider]  amoxicillin-clavulanate (AUGMENTIN) 500-125 MG tablet Take 1 tablet by mouth in the morning and at bedtime. 12/22/22   Venita Sheffield, MD  Ascorbic Acid (SUPER C COMPLEX PO) Take 1 tablet by mouth 3 (three) times daily.    [provider]  aspirin 81 MG EC tablet Take 81 mg by mouth in the morning.    [provider]  carvedilol (COREG) 6.25 MG tablet TAKE 1 TABLET BY MOUTH 2 TIMES DAILY WITH A meal **Must have office visit FOR additional refills** 11/10/22   Swaziland, Peter M, MD  cetirizine (ZYRTEC) 10 MG tablet Take 10 mg by mouth daily as needed for allergies.    [provider]  fexofenadine (ALLEGRA ALLERGY) 180 MG tablet Take 1 tablet (180 mg total) by mouth daily. 12/22/22   Venita Sheffield, MD  fluticasone  (FLONASE) 50 MCG/ACT nasal spray Place 2 sprays into both nostrils daily. 12/22/22   Venita Sheffield, MD  furosemide (LASIX) 40 MG tablet Take 1/2 tablet ( 20 mg ) twice a day 03/08/23   Swaziland, Peter M, MD  Misc Natural Products (DEEP SLEEP) CAPS Take 2 capsules by mouth at bedtime.    [provider]  Multiple Vitamins-Minerals (AIRBORNE GUMMIES PO) Take 3 capsules by mouth 2 (two) times daily.    [provider]  nitroGLYCERIN (NITROSTAT) 0.4 MG SL  tablet Place 1 tablet (0.4 mg total) under the tongue every 5 (five) minutes x 3 doses as needed for chest pain. 06/25/21   Arty Baumgartner, NP  pantoprazole (PROTONIX) 40 MG tablet TAKE 1 TABLET BY MOUTH EVERY DAY 02/26/23   Venita Sheffield, MD  REPATHA SURECLICK 140 MG/ML SOAJ INJECT into THE SKIN EVERY 14 DAYS 10/12/22   Swaziland, Peter M, MD  valsartan (DIOVAN) 40 MG tablet TAKE 1 TABLET BY MOUTH EVERY DAY 11/16/22   Swaziland, Peter M, MD  zolpidem (AMBIEN) 10 MG tablet TAKE 1 TABLET BY MOUTH NIGHTLY AT BEDTIME 02/22/23   Venita Sheffield, MD    Family History    Family History  Problem Relation Age of Onset   Breast cancer Mother    Coronary artery disease Mother    Stroke Father    Heart attack Father    Diabetes Father    Heart attack Brother    Heart disease Brother    Heart attack Maternal Grandfather    He indicated that his mother is deceased. He indicated that his father is deceased. He indicated that his sister is alive. He indicated that his brother is alive. He indicated that his maternal grandmother is deceased. He indicated that his maternal grandfather is deceased. He indicated that his paternal grandmother is deceased. He indicated that his paternal grandfather is deceased. He indicated that both of his daughters are alive. He indicated that his son is alive.  Social History    Social History   Socioeconomic History   Marital status: Married    Spouse name: Not on file   Number of children:  1   Years of education: Not on file   Highest education level: Not on file  Occupational History   Occupation: Truck Airline pilot  Tobacco Use   Smoking status: Former    Current packs/day: 0.00    Average packs/day: 2.0 packs/day for 20.0 years (40.0 ttl pk-yrs)    Types: Cigarettes    Start date: 05/07/1979    Quit date: 05/07/1999    Years since quitting: 23.8   Smokeless tobacco: Never  Vaping Use   Vaping status: Never Used  Substance and Sexual Activity   Alcohol use: Not Currently    Comment: 2-3 per month (occasionally)   Drug use: Never   Sexual activity: Yes    Partners: Female    Comment: married  Other Topics Concern   Not on file  Social History Narrative   Diet      Do you drink/eat things with caffeine  No      Marital Status Married What year were you married?  1987      Do you live in a house, apartment, assisted living, condo, trailer, etc.?  House      Is it one or more stories?  2 stories      How many persons live in your home?  2         Do you have any pets in your home?(please list)  No      Highest level of education completed:  11 Years      Current or past profession:  Sales-past      Do you exercise?:  YES    Type and how often:  Cardio and weights/At least everyday      Do you have a Living Will? (Form that indicates scenarios where you would not want your life prolonged)  YES      Do you have  a DNR form?  YES       If not, would you like to discuss one?      Do you have signed POA/HPOA forms?  NO      Do you have difficulty bathing or dressing yourself?  NO      Do you have difficulty preparing food or eating?  NO      Do you have difficulty managing medications?  NO      Do you have difficulty managing your finances?  NO      Do you have difficulty affording your medications?  NO                     Social Drivers of Corporate investment banker Strain: Low Risk  (01/09/2021)   Overall Financial Resource Strain (CARDIA)     Difficulty of Paying Living Expenses: Not very hard  Food Insecurity: Food Insecurity Present (12/17/2021)   Hunger Vital Sign    Worried About Running Out of Food in the Last Year: Never true    Ran Out of Food in the Last Year: Sometimes true  Transportation Needs: No Transportation Needs (12/24/2021)   PRAPARE - Administrator, Civil Service (Medical): No    Lack of Transportation (Non-Medical): No  Physical Activity: Not on file  Stress: Not on file  Social Connections: Unknown (05/19/2021)   [facility] from [facility]   Social Network    Social Network: Not on file  Intimate Partner Violence: Not At Risk (12/17/2021)   Humiliation, Afraid, Rape, and Kick questionnaire    Fear of Current or Ex-Partner: No    Emotionally Abused: No    Physically Abused: No    Sexually Abused: No     Review of Systems    General:  No chills, fever, night sweats or weight changes.  Cardiovascular:  No chest pain, dyspnea on exertion, edema, orthopnea, palpitations, paroxysmal nocturnal dyspnea. Dermatological: No rash, lesions/masses Respiratory: No cough, dyspnea Urologic: No hematuria, dysuria Abdominal:   No nausea, vomiting, diarrhea, bright red blood per rectum, melena, or hematemesis Neurologic:  No visual changes, wkns, changes in mental status. All other systems reviewed and are otherwise negative except as noted above.  Physical Exam    VS:  BP 134/80   Pulse 81   Ht 6\' 2"  (1.88 m)   Wt 217 lb 6.4 oz (98.6 kg)   SpO2 92%   BMI 27.91 kg/m  , BMI Body mass index is 27.91 kg/m. GEN: Well nourished, well developed, in no acute distress. HEENT: normal. Neck: Supple, no JVD, carotid bruits, or masses. Cardiac: RRR, no murmurs, rubs, or gallops. No clubbing, cyanosis, edema.  Radials/DP/PT 2+ and equal bilaterally.  Respiratory:  Respirations regular and unlabored, clear to auscultation bilaterally. GI: Soft, nontender, nondistended, BS + x 4. MS: no  deformity or atrophy. Skin: warm and dry, no rash. Neuro:  Strength and sensation are intact. Psych: Normal affect.  Accessory Clinical Findings    Recent Labs: 05/01/2022: ALT 30; BUN 18; Creatinine, Ser 0.90; Hemoglobin 13.5; Platelets 244; Potassium 4.3; Sodium 136   Recent Lipid Panel    Component Value Date/Time   CHOL 154 05/18/2022 1027   TRIG 207 (H) 05/18/2022 1027   HDL 45 05/18/2022 1027   CHOLHDL 3.4 05/18/2022 1027   CHOLHDL 2.6 10/29/2017 0939   VLDL 27 10/29/2017 0939   LDLCALC 75 05/18/2022 1027         ECG personally  reviewed by me today- EKG Interpretation Date/Time:  Friday March 12 2023 13:56:21 EST Ventricular Rate:  81 PR Interval:  140 QRS Duration:  180 QT Interval:  460 QTC Calculation: 534 R Axis:   58  Text Interpretation: Atrial-sensed ventricular-paced rhythm When compared with ECG of 01-May-2022 16:11, Fusion complexes are no longer Present Confirmed by Edd Fabian [phone]) on 03/12/2023 4:25:23 PM      Echocardiogram 06/16/2021  IMPRESSIONS     1. Left ventricular ejection fraction, by estimation, is 30 to 35%. Left  ventricular ejection fraction by 3D volume is 31 %. The left ventricle has  moderately decreased function. The left ventricle demonstrates global  hypokinesis. The left ventricular  internal cavity size was moderately dilated. Left ventricular diastolic  parameters are consistent with Grade I diastolic dysfunction (impaired  relaxation).   2. Right ventricular systolic function is mildly reduced. The right  ventricular size is normal. There is normal pulmonary artery systolic  pressure. The estimated right ventricular systolic pressure is 33.2 mmHg.   3. Left atrial size was mild to moderately dilated.   4. The mitral valve is grossly normal. Mild mitral valve regurgitation.  No evidence of mitral stenosis.   5. The aortic valve is tricuspid. Aortic valve regurgitation is mild. No  aortic stenosis is present.   6.  There is mild dilatation of the ascending aorta, measuring 41 mm.   7. The inferior vena cava is normal in size with greater than 50%  respiratory variability, suggesting right atrial pressure of 3 mmHg.   Comparison(s): Changes from prior study are noted. The left ventricular  function is worsened.   FINDINGS   Left Ventricle: Left ventricular ejection fraction, by estimation, is 30  to 35%. Left ventricular ejection fraction by 3D volume is 31 %. The left  ventricle has moderately decreased function. The left ventricle  demonstrates global hypokinesis. The left  ventricular internal cavity size was moderately dilated. Abnormal  (paradoxical) septal motion, consistent with left bundle branch block.  Left ventricular diastolic parameters are consistent with Grade I  diastolic dysfunction (impaired relaxation).   Right Ventricle: The right ventricular size is normal. No increase in  right ventricular wall thickness. Right ventricular systolic function is  mildly reduced. There is normal pulmonary artery systolic pressure. The  tricuspid regurgitant velocity is 2.75  m/s, and with an assumed right atrial pressure of 3 mmHg, the estimated  right ventricular systolic pressure is 33.2 mmHg.   Left [facility]: Left atrial size was mild to moderately dilated.   Right [facility]: Right atrial size was normal in size.   Pericardium: There is no evidence of pericardial effusion.   Mitral Valve: The mitral valve is grossly normal. Mild mitral valve  regurgitation. No evidence of mitral valve stenosis.   Tricuspid Valve: The tricuspid valve is grossly normal. Tricuspid valve  regurgitation is mild . No evidence of tricuspid stenosis.   Aortic Valve: The aortic valve is tricuspid. Aortic valve regurgitation is  mild. Aortic regurgitation PHT measures 268 msec. No aortic stenosis is  present.   Aorta: The aortic root is normal in size and structure. There is mild  dilatation of the ascending  aorta, measuring 41 mm.   Venous: The inferior vena cava is normal in size with greater than 50%  respiratory variability, suggesting right atrial pressure of 3 mmHg.       Assessment & Plan   1.  Chronic systolic CHF-weight today 217.4 pounds.  Euvolemic.  Contacted nurse triage line  on 03/08/2023.  He reported 1 week of bilateral ankle swelling and shortness of breath.  He was instructed to increase his furosemide x 3 days.  Breathing has returned to baseline.  Previously unable to tolerate GDMT due to hypotension.  Status post ICD BiV implant 03/11/2018 reports he has been drinking about 6-7 bottles of water daily.  [facility] reviewed.  Renal function stable. Heart healthy low-sodium diet Daily weights Lower extremity support stockings Elevate lower extremities when not active Continue carvedilol, furosemide, valsartan Repeat Echo Fluid restriction recommend 80 ounces or less daily  Coronary artery disease-denies anginal type symptoms.  Remains fairly physically active.  Denies exertional chest discomfort. Continue aspirin, carvedilol, Repatha, nitroglycerin as needed Heart healthy low-sodium diet  Hyperlipidemia-LDL 71 on 03/11/23. Continue Repatha High-fiber diet Maintain physical activity  Essential hypertension-BP today 134/80. Maintain blood pressure log Continue carvedilol, valsartan Heart healthy low-sodium diet  Disposition: Follow-up with Dr. Swaziland or me in 1-2 months.   Thomasene Ripple. Arleene Settle NP-C     03/12/2023, 2:34 PM [facility] [address] Office [phone] Fax [phone]    I spent 14 minutes examining this patient, reviewing medications, and using patient centered shared decision making involving their cardiac care.   I spent  20 minutes reviewing past medical history,  medications, and prior cardiac tests.

## 2023-03-11 DIAGNOSIS — I10 Essential (primary) hypertension: Secondary | ICD-10-CM | POA: Diagnosis not present

## 2023-03-11 DIAGNOSIS — R7301 Impaired fasting glucose: Secondary | ICD-10-CM | POA: Diagnosis not present

## 2023-03-11 DIAGNOSIS — E7800 Pure hypercholesterolemia, unspecified: Secondary | ICD-10-CM | POA: Diagnosis not present

## 2023-03-11 DIAGNOSIS — I2510 Atherosclerotic heart disease of native coronary artery without angina pectoris: Secondary | ICD-10-CM | POA: Diagnosis not present

## 2023-03-12 ENCOUNTER — Telehealth: Payer: Self-pay | Admitting: Cardiology

## 2023-03-12 ENCOUNTER — Encounter: Payer: Self-pay | Admitting: General Practice

## 2023-03-12 ENCOUNTER — Ambulatory Visit: Attending: General Practice | Admitting: General Practice

## 2023-03-12 VITALS — BP 134/80 | HR 81 | Ht 74.0 in | Wt 217.4 lb

## 2023-03-12 DIAGNOSIS — I10 Essential (primary) hypertension: Secondary | ICD-10-CM

## 2023-03-12 DIAGNOSIS — Z951 Presence of aortocoronary bypass graft: Secondary | ICD-10-CM | POA: Diagnosis not present

## 2023-03-12 DIAGNOSIS — I25118 Atherosclerotic heart disease of native coronary artery with other forms of angina pectoris: Secondary | ICD-10-CM

## 2023-03-12 DIAGNOSIS — I5022 Chronic systolic (congestive) heart failure: Secondary | ICD-10-CM | POA: Diagnosis not present

## 2023-03-12 NOTE — Telephone Encounter (Signed)
 Pt requesting cb to see if we are getting his transmissions due to issues he was having last week with the transmissions.

## 2023-03-12 NOTE — Patient Instructions (Signed)
 Medication Instructions:  OK to take extra 20mg  for wt gain 3 lbs/overnight or 5 lbs/weekly *If you need a refill on your cardiac medications before your next appointment, please call your pharmacy*  Lab Work: NONE  Testing/Procedures: Your physician has requested that you have an echocardiogram. Echocardiography is a painless test that uses sound waves to create images of your heart. It provides your doctor with information about the size and shape of your heart and how well your heart's chambers and valves are working. This procedure takes approximately one hour. There are no restrictions for this procedure. Please do NOT wear cologne, perfume, aftershave, or lotions (deodorant is allowed). Please arrive 15 minutes prior to your appointment time.  Please note: We ask at that you not bring children with you during ultrasound (echo/ vascular) testing. Due to room size and safety concerns, children are not allowed in the ultrasound rooms during exams. Our front office staff cannot provide observation of children in our lobby area while testing is being conducted. An adult accompanying a patient to their appointment will only be allowed in the ultrasound room at the discretion of the ultrasound technician under special circumstances. We apologize for any inconvenience.   Other Instructions FLUID RESTRICTION NO MORE THAN 80 OUNCES DAILY WEIGH DAILY MAKE SURE TO DO DOWNLOAD TO ELECTROPHYSIOLOGY MAY USE SUGAR FREE GUM AND CANDY  Follow-Up: At [facility], you and your health needs are our priority.  As part of our continuing mission to provide you with exceptional heart care, we have created designated Provider Care Teams.  These Care Teams include your primary Cardiologist (physician) and Advanced Practice Providers (APPs -  Physician Assistants and Nurse Practitioners) who all work together to provide you with the care you need, when you need it.  We recommend signing up for the patient  portal called "MyChart".  Sign up information is provided on this After Visit Summary.  MyChart is used to connect with patients for Virtual Visits (Telemedicine).  Patients are able to view lab/test results, encounter notes, upcoming appointments, etc.  Non-urgent messages can be sent to your provider as well.   To learn more about what you can do with MyChart, go to [email or web address].    Your next appointment:   1-2 month(s)  Provider:   Peter Swaziland, MD  or Edd Fabian, FNP

## 2023-03-15 NOTE — Telephone Encounter (Signed)
 Lvm letting patient know we are getting transmissions

## 2023-03-16 DIAGNOSIS — M7071 Other bursitis of hip, right hip: Secondary | ICD-10-CM | POA: Diagnosis not present

## 2023-03-16 DIAGNOSIS — R198 Other specified symptoms and signs involving the digestive system and abdomen: Secondary | ICD-10-CM | POA: Diagnosis not present

## 2023-03-16 DIAGNOSIS — M25659 Stiffness of unspecified hip, not elsewhere classified: Secondary | ICD-10-CM | POA: Diagnosis not present

## 2023-03-16 DIAGNOSIS — M7072 Other bursitis of hip, left hip: Secondary | ICD-10-CM | POA: Diagnosis not present

## 2023-03-16 DIAGNOSIS — G8929 Other chronic pain: Secondary | ICD-10-CM | POA: Diagnosis not present

## 2023-03-16 DIAGNOSIS — M545 Low back pain, unspecified: Secondary | ICD-10-CM | POA: Diagnosis not present

## 2023-03-18 ENCOUNTER — Ambulatory Visit: Payer: PPO

## 2023-03-18 DIAGNOSIS — I447 Left bundle-branch block, unspecified: Secondary | ICD-10-CM | POA: Diagnosis not present

## 2023-03-18 LAB — CUP PACEART REMOTE DEVICE CHECK
Battery Remaining Longevity: 2 mo
Battery Voltage: 2.75 V
Brady Statistic AP VP Percent: 0.1 %
Brady Statistic AP VS Percent: 0.02 %
Brady Statistic AS VP Percent: 99.15 %
Brady Statistic AS VS Percent: 0.73 %
Brady Statistic RA Percent Paced: 0.12 %
Brady Statistic RV Percent Paced: 98.23 %
Date Time Interrogation Session: 20250313022726
HighPow Impedance: 91 Ohm
Implantable Lead Connection Status: 753985
Implantable Lead Connection Status: 753985
Implantable Lead Connection Status: 753985
Implantable Lead Implant Date: 20190610
Implantable Lead Implant Date: 20200306
Implantable Lead Implant Date: 20200306
Implantable Lead Location: 753858
Implantable Lead Location: 753859
Implantable Lead Location: 753860
Implantable Lead Model: 5071
Implantable Lead Model: 5076
Implantable Pulse Generator Implant Date: 20200306
Lead Channel Impedance Value: 323 Ohm
Lead Channel Impedance Value: 4047 Ohm
Lead Channel Impedance Value: 4047 Ohm
Lead Channel Impedance Value: 437 Ohm
Lead Channel Impedance Value: 437 Ohm
Lead Channel Impedance Value: 513 Ohm
Lead Channel Pacing Threshold Amplitude: 0.625 V
Lead Channel Pacing Threshold Amplitude: 0.625 V
Lead Channel Pacing Threshold Amplitude: 1.875 V
Lead Channel Pacing Threshold Pulse Width: 0.4 ms
Lead Channel Pacing Threshold Pulse Width: 0.4 ms
Lead Channel Pacing Threshold Pulse Width: 1 ms
Lead Channel Sensing Intrinsic Amplitude: 31.625 mV
Lead Channel Sensing Intrinsic Amplitude: 31.625 mV
Lead Channel Sensing Intrinsic Amplitude: 4.375 mV
Lead Channel Sensing Intrinsic Amplitude: 4.375 mV
Lead Channel Setting Pacing Amplitude: 1.5 V
Lead Channel Setting Pacing Amplitude: 2.5 V
Lead Channel Setting Pacing Amplitude: 3 V
Lead Channel Setting Pacing Pulse Width: 0.4 ms
Lead Channel Setting Pacing Pulse Width: 1 ms
Lead Channel Setting Sensing Sensitivity: 0.6 mV
Zone Setting Status: 755011
Zone Setting Status: 755011

## 2023-03-25 DIAGNOSIS — H1132 Conjunctival hemorrhage, left eye: Secondary | ICD-10-CM | POA: Diagnosis not present

## 2023-03-31 DIAGNOSIS — E119 Type 2 diabetes mellitus without complications: Secondary | ICD-10-CM | POA: Diagnosis not present

## 2023-04-01 ENCOUNTER — Ambulatory Visit (HOSPITAL_COMMUNITY): Attending: Cardiology

## 2023-04-01 DIAGNOSIS — I25118 Atherosclerotic heart disease of native coronary artery with other forms of angina pectoris: Secondary | ICD-10-CM | POA: Diagnosis not present

## 2023-04-01 DIAGNOSIS — I10 Essential (primary) hypertension: Secondary | ICD-10-CM | POA: Insufficient documentation

## 2023-04-01 DIAGNOSIS — I5022 Chronic systolic (congestive) heart failure: Secondary | ICD-10-CM | POA: Insufficient documentation

## 2023-04-01 DIAGNOSIS — Z951 Presence of aortocoronary bypass graft: Secondary | ICD-10-CM | POA: Insufficient documentation

## 2023-04-01 LAB — ECHOCARDIOGRAM COMPLETE
Area-P 1/2: 4.85 cm2
P 1/2 time: 265 ms
S' Lateral: 5.3 cm

## 2023-04-01 MED ORDER — PERFLUTREN LIPID MICROSPHERE
1.0000 mL | INTRAVENOUS | Status: AC | PRN
  Administered 2023-04-01: 2 mL via INTRAVENOUS

## 2023-04-06 NOTE — Progress Notes (Unsigned)
 Cardiology Clinic Note   Patient Name: [name] Date of Encounter: 04/08/2023  Primary Care Provider:  Venita Sheffield, MD Primary Cardiologist:  Peter Swaziland, MD  Patient Profile    [name] 68 year old male presents the clinic today for follow-up evaluation of his lower extremity swelling and increased work of breathing.  Past Medical History    Past Medical History:  Diagnosis Date   AICD (automatic cardioverter/defibrillator) present    Anxiety    Arthritis    "mild; back, neck, spine" (06/09/2017)   CHF (congestive heart failure) (HCC)    Chronic back pain    Coronary artery disease    GERD (gastroesophageal reflux disease)    History of gout    History of kidney stones X 2   Hypercholesterolemia    Hypertension    LBBB (left bundle branch block)    MI (myocardial infarction) (HCC) 1993   LATERAL   MI (myocardial infarction) (HCC) ?09/2001; 06/07/2017   Pneumonia    "several times" (06/09/2017)   Pre-diabetes    S/P coronary artery stent placement    RCA   Varicose veins    Past Surgical History:  Procedure Laterality Date   ANTERIOR CERVICAL DECOMP/DISCECTOMY FUSION  03/03/2011   Procedure: ANTERIOR CERVICAL DECOMPRESSION/DISCECTOMY FUSION 3 LEVELS;  Surgeon: Karn Cassis, MD;  Location: [facility]UR[facility] ORS;  Service: Neurosurgery;  Laterality: N/A;  Cervical three-four Cervical four-five Cervical five-six Anterior cervical decompression/diskectomy, fusion   APPLICATION OF ROBOTIC ASSISTANCE FOR SPINAL PROCEDURE N/A 10/03/2020   Procedure: APPLICATION OF ROBOTIC ASSISTANCE FOR SPINAL PROCEDURE;  Surgeon: Lisbeth Renshaw, MD;  Location: [facility] OR;  Service: Neurosurgery;  Laterality: N/A;   BACK SURGERY     CARDIAC CATHETERIZATION  2009   Stents in RCA patent. 70 to 80% PL, and 50% ostial PD.    CARDIAC CATHETERIZATION N/A 11/20/2014   Procedure: Left Heart Cath and Coronary Angiography;  Surgeon: Peter M Swaziland, MD;  Location: [facility] INVASIVE CV LAB;   Service: Cardiovascular;  Laterality: N/A;   CORONARY ANGIOPLASTY     DIRECT ANGIOPLASTY THE MARGINAL BRANCH   CORONARY ANGIOPLASTY WITH STENT PLACEMENT     RCA   CORONARY ARTERY BYPASS GRAFT N/A 06/14/2017   Procedure: CORONARY ARTERY BYPASS GRAFTING (CABG) x four, using left internal mammary artery and right leg greater saphenous vein harvested endoscopically;  Surgeon: Kerin Perna, MD;  Location: [facility] OR;  Service: Open Heart Surgery;  Laterality: N/A;   CORONARY PRESSURE/FFR STUDY N/A 06/24/2021   Procedure: INTRAVASCULAR PRESSURE WIRE/FFR STUDY;  Surgeon: Marykay Lex, MD;  Location: [facility] INVASIVE CV LAB;  Service: Cardiovascular;  Laterality: N/A;   CORONARY STENT INTERVENTION N/A 06/24/2021   Procedure: CORONARY STENT INTERVENTION;  Surgeon: Marykay Lex, MD;  Location: [facility] INVASIVE CV LAB;  Service: Cardiovascular;  Laterality: N/A;   CYSTOSCOPY N/A 06/14/2017   Procedure: CYSTOSCOPY;  Surgeon: Kerin Perna, MD;  Location: [facility] OR;  Service: Open Heart Surgery;  Laterality: N/A;   ICD IMPLANT N/A 03/11/2018   Procedure: ICD IMPLANT - Dual Chamber;  Surgeon: [name], MD;  Location: [facility] INVASIVE CV LAB;  Service: Cardiovascular;  Laterality: N/A;   INSERTION OF SUPRAPUBIC CATHETER N/A 06/14/2017   Procedure: INSERTION OF SUPRAPUBIC CATHETER - Lower abdomen;  Surgeon: Kerin Perna, MD;  Location: [facility] OR;  Service: Open Heart Surgery;  Laterality: N/A;   POSTERIOR LUMBAR FUSION  10/2011   RIGHT/LEFT HEART CATH AND CORONARY ANGIOGRAPHY N/A 06/08/2017   Procedure:  RIGHT/LEFT HEART CATH AND CORONARY ANGIOGRAPHY;  Surgeon: Swaziland, Peter M, MD;  Location: [facility] INVASIVE CV LAB;  Service: Cardiovascular;  Laterality: N/A;   RIGHT/LEFT HEART CATH AND CORONARY/GRAFT ANGIOGRAPHY N/A 09/06/2020   Procedure: RIGHT/LEFT HEART CATH AND CORONARY/GRAFT ANGIOGRAPHY;  Surgeon: Swaziland, Peter M, MD;  Location: [facility] INVASIVE CV LAB;  Service: Cardiovascular;  Laterality: N/A;   RIGHT/LEFT HEART CATH AND  CORONARY/GRAFT ANGIOGRAPHY N/A 06/24/2021   Procedure: RIGHT/LEFT HEART CATH AND CORONARY/GRAFT ANGIOGRAPHY;  Surgeon: Marykay Lex, MD;  Location: [facility] INVASIVE CV LAB;  Service: Cardiovascular;  Laterality: N/A;   TEE WITHOUT CARDIOVERSION N/A 06/14/2017   Procedure: TRANSESOPHAGEAL ECHOCARDIOGRAM (TEE);  Surgeon: Donata Clay, Theron Arista, MD;  Location: [facility] OR;  Service: Open Heart Surgery;  Laterality: N/A;   TOTAL KNEE ARTHROPLASTY Right 12/17/2021   Procedure: TOTAL KNEE ARTHROPLASTY;  Surgeon: Eugenia Mcalpine, MD;  Location: [facility] ORS;  Service: Orthopedics;  Laterality: Right;  adductor canal  120   URETHROPLASTY N/A 10/15/2017   Procedure: URETHROPLASTY WITH BUCCAL GRAFT HARVAST;  Surgeon: Crist Fat, MD;  Location: [facility] ORS;  Service: Urology;  Laterality: N/A;    Allergies  Allergies  Allergen Reactions   Ezetimibe Other (See Comments)    Joint pain and drops BP   Niacin Other (See Comments)    Drops BP and severe joint/muscle pain   Statins Other (See Comments)    Severe joint pain and drops BP.    History of Present Illness    [name] has a PMH of coronary artery disease status post CABG, left bundle branch block, HLD, HTN, and chronic systolic CHF.  He was admitted 6/19 with flash pulmonary edema.  He was ruled in for NSTEMI and underwent LHC which showed severe three-vessel CAD.  He underwent CABG by Dr. Donata Clay with LIMA-LAD, SVG-diagonal, SVG-ramus intermediate, and SVG-RCA.  He had syncope 6/19 while waiting in the urology office due to urethral stricture.  He was noted to have wide-complex tachycardia on telemetry.  His heart rate was 170.  Echocardiogram showed an LVEF of 35-40 percent.  His lower extremity Dopplers were negative for DVT.  Chest CT was negative for PE.  He was seen by EP.  He was noted to have underlying rhythm which was either VT or NSVT.  He was fitted for LifeVest.  His medication was decreased due to mild hypotension and orthostasis.  Repeat  echocardiogram 9/19 showed low EF of 25-30%.  He was unable to tolerate Entresto, spironolactone and carvedilol due to orthostatic symptoms and lightheadedness.  He eventually underwent BiV ICD implant 3/20 by Dr. Graciela Husbands.  Repeat echocardiogram 7/20 showed significant improvement in his EF 40-45%.  He presented to the emergency department 06/23/2021 with complaints of shortness of breath.  He was hospitalized 06/23/2021 until 06/25/2021.  This was in the setting of acute heart failure exacerbation, coronary artery disease with anginal equivalent.  His cardiac troponins were mildly elevated.  His chest x-ray showed mild CHF.  He received IV diuresis.  His CTA chest was negative for PE.  He underwent cardiac catheterization 06/24/2021 which showed stable three-vessel CAD and known occluded SVG-ramus intermediate.  He had otherwise patent grafts.  He was noted to have progression of his proximal ramus intermedius lesion to 80% and positive FFR status post DES.  His right heart cath showed relatively normal pressures.  He followed up with Dr. Swaziland 11/23/2022.  During that time he was riding his bike 30 to 60 minutes/day.  He was going to  the gym 3-4 times per week.  He was also doing a lot of yard work.  He had no shortness of breath and or angina.  He still has some soreness at his ICD site.  He was unable to lay on his left side to sleep at night.  He had no lower extremity swelling.  He was taking only 20 mg of Lasix daily.  He contacted the nurse triage line on 03/08/2023.  He reported coughing up white phlegm, swelling in bilateral ankles, shortness of breath with exertion x 1 week and noted that he had followed up with his PCP.  Chest x-ray was ordered and showed fluid bilateral lungs.  He was instructed to increase his diuresis x 3 days and follow-up with cardiology.  He presented to the clinic 03/12/23 for evaluation and stated he noted a week or so ago that he was having more increased work of breathing.  He  had been placed on prednisone for his joint inflammation and reported increasing his Lasix to 40 mg for the last several days.  His weight was 217 pounds.  He continued to have some generalized bilateral lower extremity nonpitting edema.  He reported he drinking about 7 bottles of water per day.  His blood pressure initially was 140/84 and on recheck was 134/80.  I recommended that he reduce the amount of fluid that he is consuming.  I ordered an echocardiogram and plan follow-up in 1-2 months.  Echocardiogram 04/01/2023 showed stable LVEF at 30-35%, intermediate diastolic parameters, and mild-moderate left atrial dilation.  He was also noted to have mild dilation of his ascending aorta measuring 41 mm.  No significant changed findings were noted.  He presents to the clinic today for follow-up evaluation and states he continues to notice shortness of breath with increased physical activity.  He notes that he has to take several breaks when he is doing yard work.  We reviewed his echocardiogram.  He expressed understanding.  His blood pressure today is 132/70.  He continues to note left sided chest discomfort which appears to be related to musculoskeletal soreness.  I recommended follow-up with pulmonologist for PFTs.  He wishes for his PCP to make a referral.  On exam he is noted to have diminished breath sounds on the left.  I will order chest x-ray and keep follow-up with Dr. Swaziland as scheduled.  Today he denies chest pain, increased shortness of breath,  fatigue, palpitations, melena, hematuria, hemoptysis, diaphoresis, weakness, presyncope, syncope, orthopnea, and PND.     Home Medications    Prior to Admission medications   Medication Sig Start Date End Date Taking? Authorizing Provider  acetaminophen (TYLENOL) 500 MG tablet Take 1,000 mg by mouth 3 (three) times daily. Pt. Reports he is taking half    [provider]  amoxicillin-clavulanate (AUGMENTIN) 500-125 MG tablet Take 1 tablet  by mouth in the morning and at bedtime. 12/22/22   Venita Sheffield, MD  Ascorbic Acid (SUPER C COMPLEX PO) Take 1 tablet by mouth 3 (three) times daily.    [provider]  aspirin 81 MG EC tablet Take 81 mg by mouth in the morning.    [provider]  carvedilol (COREG) 6.25 MG tablet TAKE 1 TABLET BY MOUTH 2 TIMES DAILY WITH A meal **Must have office visit FOR additional refills** 11/10/22   Swaziland, Peter M, MD  cetirizine (ZYRTEC) 10 MG tablet Take 10 mg by mouth daily as needed for allergies.    [provider]  fexofenadine (ALLEGRA ALLERGY) 180 MG tablet Take 1 tablet (180 mg total) by mouth daily. 12/22/22   Venita Sheffield, MD  fluticasone (FLONASE) 50 MCG/ACT nasal spray Place 2 sprays into both nostrils daily. 12/22/22   Venita Sheffield, MD  furosemide (LASIX) 40 MG tablet Take 1/2 tablet ( 20 mg ) twice a day 03/08/23   Swaziland, Peter M, MD  Misc Natural Products (DEEP SLEEP) CAPS Take 2 capsules by mouth at bedtime.    [provider]  Multiple Vitamins-Minerals (AIRBORNE GUMMIES PO) Take 3 capsules by mouth 2 (two) times daily.    [provider]  nitroGLYCERIN (NITROSTAT) 0.4 MG SL tablet Place 1 tablet (0.4 mg total) under the tongue every 5 (five) minutes x 3 doses as needed for chest pain. 06/25/21   Arty Baumgartner, NP  pantoprazole (PROTONIX) 40 MG tablet TAKE 1 TABLET BY MOUTH EVERY DAY 02/26/23   Venita Sheffield, MD  REPATHA SURECLICK 140 MG/ML SOAJ INJECT into THE SKIN EVERY 14 DAYS 10/12/22   Swaziland, Peter M, MD  valsartan (DIOVAN) 40 MG tablet TAKE 1 TABLET BY MOUTH EVERY DAY 11/16/22   Swaziland, Peter M, MD  zolpidem (AMBIEN) 10 MG tablet TAKE 1 TABLET BY MOUTH NIGHTLY AT BEDTIME 02/22/23   Venita Sheffield, MD    Family History    Family History  Problem Relation Age of Onset   Breast cancer Mother    Coronary artery disease Mother    Stroke Father    Heart attack Father    Diabetes Father    Heart  attack Brother    Heart disease Brother    Heart attack Maternal Grandfather    He indicated that his mother is deceased. He indicated that his father is deceased. He indicated that his sister is alive. He indicated that his brother is alive. He indicated that his maternal grandmother is deceased. He indicated that his maternal grandfather is deceased. He indicated that his paternal grandmother is deceased. He indicated that his paternal grandfather is deceased. He indicated that both of his daughters are alive. He indicated that his son is alive.  Social History    Social History   Socioeconomic History   Marital status: Married    Spouse name: Not on file   Number of children: 1   Years of education: Not on file   Highest education level: Not on file  Occupational History   Occupation: Truck Airline pilot  Tobacco Use   Smoking status: Former    Current packs/day: 0.00    Average packs/day: 2.0 packs/day for 20.0 years (40.0 ttl pk-yrs)    Types: Cigarettes    Start date: 05/07/1979    Quit date: 05/07/1999    Years since quitting: 23.9   Smokeless tobacco: Never  Vaping Use   Vaping status: Never Used  Substance and Sexual Activity   Alcohol use: Not Currently    Comment: 2-3 per month (occasionally)   Drug use: Never   Sexual activity: Yes    Partners: Female    Comment: married  Other Topics Concern   Not on file  Social History Narrative   Diet      Do you drink/eat things with caffeine  No      Marital Status Married What year were you married?  1987      Do you live in a house, apartment, assisted living, condo, trailer, etc.?  House      Is it one or more stories?  2 stories  How many persons live in your home?  2         Do you have any pets in your home?(please list)  No      Highest level of education completed:  11 Years      Current or past profession:  Sales-past      Do you exercise?:  YES    Type and how often:  Cardio and weights/At least everyday       Do you have a Living Will? (Form that indicates scenarios where you would not want your life prolonged)  YES      Do you have a DNR form?  YES       If not, would you like to discuss one?      Do you have signed POA/HPOA forms?  NO      Do you have difficulty bathing or dressing yourself?  NO      Do you have difficulty preparing food or eating?  NO      Do you have difficulty managing medications?  NO      Do you have difficulty managing your finances?  NO      Do you have difficulty affording your medications?  NO                     Social Drivers of Corporate investment banker Strain: Low Risk  (01/09/2021)   Overall Financial Resource Strain (CARDIA)    Difficulty of Paying Living Expenses: Not very hard  Food Insecurity: Food Insecurity Present (12/17/2021)   Hunger Vital Sign    Worried About Running Out of Food in the Last Year: Never true    Ran Out of Food in the Last Year: Sometimes true  Transportation Needs: No Transportation Needs (12/24/2021)   PRAPARE - Administrator, Civil Service (Medical): No    Lack of Transportation (Non-Medical): No  Physical Activity: Not on file  Stress: Not on file  Social Connections: Unknown (05/19/2021)   Received from [facility]   Social Network    Social Network: Not on file  Intimate Partner Violence: Not At Risk (12/17/2021)   Humiliation, Afraid, Rape, and Kick questionnaire    Fear of Current or Ex-Partner: No    Emotionally Abused: No    Physically Abused: No    Sexually Abused: No     Review of Systems    General:  No chills, fever, night sweats or weight changes.  Cardiovascular:  No chest pain, dyspnea on exertion, edema, orthopnea, palpitations, paroxysmal nocturnal dyspnea. Dermatological: No rash, lesions/masses Respiratory: No cough, dyspnea Urologic: No hematuria, dysuria Abdominal:   No nausea, vomiting, diarrhea, bright red blood per rectum, melena, or  hematemesis Neurologic:  No visual changes, wkns, changes in mental status. All other systems reviewed and are otherwise negative except as noted above.  Physical Exam    VS:  BP 132/70 (BP Location: Left Arm, Patient Position: Sitting, Cuff Size: Large)   Pulse 84   Ht 6\' 1"  (1.854 m)   Wt 219 lb (99.3 kg)   SpO2 95%   BMI 28.89 kg/m  , BMI Body mass index is 28.89 kg/m. GEN: Well nourished, well developed, in no acute distress. HEENT: normal. Neck: Supple, no JVD, carotid bruits, or masses. Cardiac: RRR, no murmurs, rubs, or gallops. No clubbing, cyanosis, edema.  Radials/DP/PT 2+ and equal bilaterally.  Respiratory:  Respirations regular and unlabored, clear to auscultation throughout right lung  fields.  Diminished breath sounds left lower lobe and upper lobe. GI: Soft, nontender, nondistended, BS + x 4. MS: no deformity or atrophy.  Left pectoral soreness Skin: warm and dry, no rash. Neuro:  Strength and sensation are intact. Psych: Normal affect.  Accessory Clinical Findings    Recent Labs: 05/01/2022: ALT 30; BUN 18; Creatinine, Ser 0.90; Hemoglobin 13.5; Platelets 244; Potassium 4.3; Sodium 136   Recent Lipid Panel    Component Value Date/Time   CHOL 154 05/18/2022 1027   TRIG 207 (H) 05/18/2022 1027   HDL 45 05/18/2022 1027   CHOLHDL 3.4 05/18/2022 1027   CHOLHDL 2.6 10/29/2017 0939   VLDL 27 10/29/2017 0939   LDLCALC 75 05/18/2022 1027         ECG personally reviewed by me today-none today.      Echocardiogram 06/16/2021  IMPRESSIONS     1. Left ventricular ejection fraction, by estimation, is 30 to 35%. Left  ventricular ejection fraction by 3D volume is 31 %. The left ventricle has  moderately decreased function. The left ventricle demonstrates global  hypokinesis. The left ventricular  internal cavity size was moderately dilated. Left ventricular diastolic  parameters are consistent with Grade I diastolic dysfunction (impaired  relaxation).   2.  Right ventricular systolic function is mildly reduced. The right  ventricular size is normal. There is normal pulmonary artery systolic  pressure. The estimated right ventricular systolic pressure is 33.2 mmHg.   3. Left atrial size was mild to moderately dilated.   4. The mitral valve is grossly normal. Mild mitral valve regurgitation.  No evidence of mitral stenosis.   5. The aortic valve is tricuspid. Aortic valve regurgitation is mild. No  aortic stenosis is present.   6. There is mild dilatation of the ascending aorta, measuring 41 mm.   7. The inferior vena cava is normal in size with greater than 50%  respiratory variability, suggesting right atrial pressure of 3 mmHg.   Comparison(s): Changes from prior study are noted. The left ventricular  function is worsened.   FINDINGS   Left Ventricle: Left ventricular ejection fraction, by estimation, is 30  to 35%. Left ventricular ejection fraction by 3D volume is 31 %. The left  ventricle has moderately decreased function. The left ventricle  demonstrates global hypokinesis. The left  ventricular internal cavity size was moderately dilated. Abnormal  (paradoxical) septal motion, consistent with left bundle branch block.  Left ventricular diastolic parameters are consistent with Grade I  diastolic dysfunction (impaired relaxation).   Right Ventricle: The right ventricular size is normal. No increase in  right ventricular wall thickness. Right ventricular systolic function is  mildly reduced. There is normal pulmonary artery systolic pressure. The  tricuspid regurgitant velocity is 2.75  m/s, and with an assumed right atrial pressure of 3 mmHg, the estimated  right ventricular systolic pressure is 33.2 mmHg.   Left [facility]: Left atrial size was mild to moderately dilated.   Right At[facility]: Right atrial size was normal in size.   Pericardium: There is no evidence of pericardial effusion.   Mitral Valve: The mitral valve is grossly  normal. Mild mitral valve  regurgitation. No evidence of mitral valve stenosis.   Tricuspid Valve: The tricuspid valve is grossly normal. Tricuspid valve  regurgitation is mild . No evidence of tricuspid stenosis.   Aortic Valve: The aortic valve is tricuspid. Aortic valve regurgitation is  mild. Aortic regurgitation PHT measures 268 msec. No aortic stenosis is  present.   Aorta:  The aortic root is normal in size and structure. There is mild  dilatation of the ascending aorta, measuring 41 mm.   Venous: The inferior vena cava is normal in size with greater than 50%  respiratory variability, suggesting right atrial pressure of 3 mmHg.   Echocardiogram 04/01/2023   IMPRESSIONS     1. Left ventricular ejection fraction, by estimation, is 30 to 35%. The  left ventricle has moderately decreased function. The left ventricle  demonstrates global hypokinesis. The left ventricular internal cavity size  was severely dilated. There is mild  left ventricular hypertrophy. Left ventricular diastolic parameters are  indeterminate.   2. Right ventricular systolic function is normal. The right ventricular  size is not well visualized.   3. Left atrial size was mild to moderately dilated.   4. The mitral valve is normal in structure. Trivial mitral valve  regurgitation. No evidence of mitral stenosis.   5. The aortic valve is tricuspid. Aortic valve regurgitation is mild to  moderate. No aortic stenosis is present.   6. Aortic dilatation noted. There is mild dilatation of the ascending  aorta, measuring 41 mm.   7. The inferior vena cava is normal in size with greater than 50%  respiratory variability, suggesting right atrial pressure of 3 mmHg.   Comparison(s): No significant change from prior study. Prior images  reviewed side by side.   FINDINGS   Left Ventricle: Left ventricular ejection fraction, by estimation, is 30  to 35%. The left ventricle has moderately decreased function. The  left  ventricle demonstrates global hypokinesis. Definity contrast agent was  given IV to delineate the left  ventricular endocardial borders. The left ventricular internal cavity size  was severely dilated. There is mild left ventricular hypertrophy. Left  ventricular diastolic parameters are indeterminate.   Right Ventricle: The right ventricular size is not well visualized. Right  vetricular wall thickness was not well visualized. Right ventricular  systolic function is normal.   Left Atrium: Left atrial size was mild to moderately dilated.   Right Atrium: Right atrial size was normal in size.   Pericardium: There is no evidence of pericardial effusion.   Mitral Valve: The mitral valve is normal in structure. Trivial mitral  valve regurgitation. No evidence of mitral valve stenosis.   Tricuspid Valve: The tricuspid valve is grossly normal. Tricuspid valve  regurgitation is trivial. No evidence of tricuspid stenosis.   The aortic valve is tricuspid. Aortic valve regurgitation is mild to  moderate. No aortic stenosis is present.  Pulmonic Valve: The pulmonic valve was not well visualized. Pulmonic valve  regurgitation is not visualized. No evidence of pulmonic stenosis.   Aorta: Aortic dilatation noted. There is mild dilatation of the ascending  aorta, measuring 41 mm.   Venous: The inferior vena cava is normal in size with greater than 50%  respiratory variability, suggesting right atrial pressure of 3 mmHg.   IAS/Shunts: The atrial septum is grossly normal.     Assessment & Plan   1.  Chronic systolic CHF-weight today 219 pounds.  Continues to be euvolemic.  Previously unable to tolerate GDMT due to hypotension.  Status post ICD BiV implant 03/11/2018 reports he has been drinking about 6-7 bottles of water daily.  Recent lab work from [facility] reviewed.  Renal function stable-again reviewed.  Echocardiogram showed stable LVEF of 30-35%.Marland Kitchen Heart healthy low-sodium diet Daily  weights-weight log, contact office with a weight increase of 2 to 3 pounds overnight or 5 pounds in 1 week. Lower  extremity support stockings Elevate lower extremities when not active Continue carvedilol, furosemide, valsartan Fluid restriction recommend 80 ounces or less daily  Coronary artery disease-does have left pectoral soreness which is chronic.  No anginal type symptoms.  Maintains baseline physical activity.  Denies exertional chest discomfort. Continue aspirin, carvedilol, Repatha, nitroglycerin as needed Continue heart healthy low-sodium diet  Essential hypertension-BP today 132/70 Continue carvedilol, valsartan  DOE, shortness of breath-has diminished lung sounds throughout left lung fields.  Recommended referral to pulmonologist for PFTs.  He wishes for his PCP to make referral.  He is using Mucinex at this time for seasonal allergies and mucus production. Order chest x-ray  Disposition: Follow-up with Dr. Swaziland as scheduled.   Thomasene Ripple. Brenlynn Fake NP-C     04/08/2023, 10:24 AM [facility] [address] Office [phone] Fax [phone]    I spent 14 minutes examining this patient, reviewing medications, and using patient centered shared decision making involving their cardiac care.   I spent  20 minutes reviewing past medical history,  medications, and prior cardiac tests.

## 2023-04-08 ENCOUNTER — Ambulatory Visit: Attending: General Practice | Admitting: General Practice

## 2023-04-08 ENCOUNTER — Encounter: Payer: Self-pay | Admitting: General Practice

## 2023-04-08 VITALS — BP 132/70 | HR 84 | Ht 73.0 in | Wt 219.0 lb

## 2023-04-08 DIAGNOSIS — I25118 Atherosclerotic heart disease of native coronary artery with other forms of angina pectoris: Secondary | ICD-10-CM | POA: Diagnosis not present

## 2023-04-08 DIAGNOSIS — R0689 Other abnormalities of breathing: Secondary | ICD-10-CM | POA: Diagnosis not present

## 2023-04-08 DIAGNOSIS — R0609 Other forms of dyspnea: Secondary | ICD-10-CM

## 2023-04-08 DIAGNOSIS — I5022 Chronic systolic (congestive) heart failure: Secondary | ICD-10-CM

## 2023-04-08 DIAGNOSIS — I10 Essential (primary) hypertension: Secondary | ICD-10-CM

## 2023-04-08 MED ORDER — NITROGLYCERIN 0.4 MG SL SUBL: 0.4000 mg | SUBLINGUAL_TABLET | SUBLINGUAL | 1 refills | Status: DC | PRN

## 2023-04-08 MED ORDER — NITROGLYCERIN 0.4 MG SL SUBL: 0.4000 mg | SUBLINGUAL_TABLET | SUBLINGUAL | 2 refills | Status: DC | PRN

## 2023-04-08 NOTE — Patient Instructions (Addendum)
 Medication Instructions:  The current medical regimen is effective;  continue present plan and medications as directed. Please refer to the Current Medication list given to you today.  *If you need a refill on your cardiac medications before your next appointment, please call your pharmacy*  Lab Work: NONE If you have labs (blood work) drawn today and your tests are completely normal, you will receive your results only by:  MyChart Message (if you have MyChart) OR A paper copy in the mail If you have any lab test that is abnormal or we need to change your treatment, we will call you to review the results.  Testing/Procedures: A chest x-ray takes a picture of the organs and structures inside the chest, including the heart, lungs, and blood vessels. This test can show several things, including, whether the heart is enlarges; whether fluid is building up in the lungs; and whether pacemaker / defibrillator leads are still in place. THIS WILL BE DONE AT [facility] [id number]  Follow-Up: At [facility], you and your health needs are our priority.  As part of our continuing mission to provide you with exceptional heart care, our providers are all part of one team.  This team includes your primary Cardiologist (physician) and Advanced Practice Providers or APPs (Physician Assistants and Nurse Practitioners) who all work together to provide you with the care you need, when you need it.  Your next appointment:   KEEP SCHEDULED APPOINTMENT   Provider:   Peter Swaziland, MD    We recommend signing up for the patient portal called "MyChart".  Sign up information is provided on this After Visit Summary.  MyChart is used to connect with patients for Virtual Visits (Telemedicine).  Patients are able to view lab/test results, encounter notes, upcoming appointments, etc.  Non-urgent messages can be sent to your provider as well.   To learn more about what you can do with MyChart,  go to [email or web address].   Other Instructions HAVE PRIMARY CARE REFER TO PULMONOLOGY FOR PFT's (PULMONARY FUNCTION TESTING)      1st Floor: - Lobby - Registration  - Pharmacy  - Lab - Cafe  2nd Floor: - PV Lab - Diagnostic Testing (echo, CT, nuclear med)  3rd Floor: - Vacant  4th Floor: - TCTS (cardiothoracic surgery) - AFib Clinic - Structural Heart Clinic - Vascular Surgery  - Vascular Ultrasound  5th Floor: - HeartCare Cardiology (general and EP) - Clinical Pharmacy for coumadin, hypertension, lipid, weight-loss medications, and med management appointments    Valet parking services will be available as well.

## 2023-04-12 ENCOUNTER — Ambulatory Visit
Admission: RE | Admit: 2023-04-12 | Discharge: 2023-04-12 | Disposition: A | Discharge status: Home / Self Care | Source: Ambulatory Visit | Attending: General Practice | Admitting: General Practice

## 2023-04-12 DIAGNOSIS — M7072 Other bursitis of hip, left hip: Secondary | ICD-10-CM | POA: Diagnosis not present

## 2023-04-12 DIAGNOSIS — R0602 Shortness of breath: Secondary | ICD-10-CM | POA: Diagnosis not present

## 2023-04-12 DIAGNOSIS — R0689 Other abnormalities of breathing: Secondary | ICD-10-CM

## 2023-04-12 DIAGNOSIS — R0609 Other forms of dyspnea: Secondary | ICD-10-CM

## 2023-04-12 DIAGNOSIS — M545 Low back pain, unspecified: Secondary | ICD-10-CM | POA: Diagnosis not present

## 2023-04-12 DIAGNOSIS — M7071 Other bursitis of hip, right hip: Secondary | ICD-10-CM | POA: Diagnosis not present

## 2023-04-12 DIAGNOSIS — G8929 Other chronic pain: Secondary | ICD-10-CM | POA: Diagnosis not present

## 2023-04-19 ENCOUNTER — Ambulatory Visit (INDEPENDENT_AMBULATORY_CARE_PROVIDER_SITE_OTHER): Payer: PPO

## 2023-04-19 DIAGNOSIS — G8929 Other chronic pain: Secondary | ICD-10-CM | POA: Diagnosis not present

## 2023-04-19 DIAGNOSIS — M545 Low back pain, unspecified: Secondary | ICD-10-CM | POA: Diagnosis not present

## 2023-04-19 DIAGNOSIS — M25659 Stiffness of unspecified hip, not elsewhere classified: Secondary | ICD-10-CM | POA: Diagnosis not present

## 2023-04-19 DIAGNOSIS — M7072 Other bursitis of hip, left hip: Secondary | ICD-10-CM | POA: Diagnosis not present

## 2023-04-19 DIAGNOSIS — M7071 Other bursitis of hip, right hip: Secondary | ICD-10-CM | POA: Diagnosis not present

## 2023-04-19 DIAGNOSIS — I5022 Chronic systolic (congestive) heart failure: Secondary | ICD-10-CM

## 2023-04-20 LAB — CUP PACEART REMOTE DEVICE CHECK
Battery Remaining Longevity: 1 mo
Battery Voltage: 2.72 V
Brady Statistic AP VP Percent: 0.06 %
Brady Statistic AP VS Percent: 0.02 %
Brady Statistic AS VP Percent: 99.48 %
Brady Statistic AS VS Percent: 0.44 %
Brady Statistic RA Percent Paced: 0.08 %
Brady Statistic RV Percent Paced: 98.83 %
Date Time Interrogation Session: 20250414033623
HighPow Impedance: 94 Ohm
Implantable Lead Connection Status: 753985
Implantable Lead Connection Status: 753985
Implantable Lead Connection Status: 753985
Implantable Lead Implant Date: 20190610
Implantable Lead Implant Date: 20200306
Implantable Lead Implant Date: 20200306
Implantable Lead Location: 753858
Implantable Lead Location: 753859
Implantable Lead Location: 753860
Implantable Lead Model: 5071
Implantable Lead Model: 5076
Implantable Pulse Generator Implant Date: 20200306
Lead Channel Impedance Value: 285 Ohm
Lead Channel Impedance Value: 399 Ohm
Lead Channel Impedance Value: 399 Ohm
Lead Channel Impedance Value: 4047 Ohm
Lead Channel Impedance Value: 4047 Ohm
Lead Channel Impedance Value: 456 Ohm
Lead Channel Pacing Threshold Amplitude: 0.5 V
Lead Channel Pacing Threshold Amplitude: 0.625 V
Lead Channel Pacing Threshold Amplitude: 1.75 V
Lead Channel Pacing Threshold Pulse Width: 0.4 ms
Lead Channel Pacing Threshold Pulse Width: 0.4 ms
Lead Channel Pacing Threshold Pulse Width: 1 ms
Lead Channel Sensing Intrinsic Amplitude: 31.625 mV
Lead Channel Sensing Intrinsic Amplitude: 31.625 mV
Lead Channel Sensing Intrinsic Amplitude: 4.875 mV
Lead Channel Sensing Intrinsic Amplitude: 4.875 mV
Lead Channel Setting Pacing Amplitude: 1.5 V
Lead Channel Setting Pacing Amplitude: 2.5 V
Lead Channel Setting Pacing Amplitude: 3 V
Lead Channel Setting Pacing Pulse Width: 0.4 ms
Lead Channel Setting Pacing Pulse Width: 1 ms
Lead Channel Setting Sensing Sensitivity: 0.6 mV
Zone Setting Status: 755011
Zone Setting Status: 755011

## 2023-04-21 DIAGNOSIS — R0609 Other forms of dyspnea: Secondary | ICD-10-CM | POA: Diagnosis not present

## 2023-04-26 ENCOUNTER — Other Ambulatory Visit (HOSPITAL_COMMUNITY): Payer: Self-pay | Admitting: Neurosurgery

## 2023-04-26 DIAGNOSIS — S32009K Unspecified fracture of unspecified lumbar vertebra, subsequent encounter for fracture with nonunion: Secondary | ICD-10-CM

## 2023-04-27 ENCOUNTER — Other Ambulatory Visit: Payer: Self-pay | Admitting: Cardiology

## 2023-04-28 ENCOUNTER — Telehealth: Payer: Self-pay

## 2023-04-28 NOTE — Telephone Encounter (Signed)
 Alert received from [employer]:  Alert remote transmission: Device reached RRT 4/23 - route to triage   Outreach:

## 2023-04-28 NOTE — Telephone Encounter (Signed)
 Spoke to patient and notified him that his device has reached RRT. Will route to EP scheduling to book appointment.

## 2023-04-29 DIAGNOSIS — M25561 Pain in right knee: Secondary | ICD-10-CM | POA: Diagnosis not present

## 2023-04-29 DIAGNOSIS — G8929 Other chronic pain: Secondary | ICD-10-CM | POA: Diagnosis not present

## 2023-04-29 DIAGNOSIS — Z96651 Presence of right artificial knee joint: Secondary | ICD-10-CM | POA: Diagnosis not present

## 2023-04-30 NOTE — Progress Notes (Signed)
 Remote ICD transmission.

## 2023-05-04 ENCOUNTER — Ambulatory Visit (HOSPITAL_BASED_OUTPATIENT_CLINIC_OR_DEPARTMENT_OTHER)
Admission: RE | Admit: 2023-05-04 | Discharge: 2023-05-04 | Disposition: A | Discharge status: Home / Self Care | Source: Ambulatory Visit | Attending: Neurosurgery | Admitting: Neurosurgery

## 2023-05-04 DIAGNOSIS — M4317 Spondylolisthesis, lumbosacral region: Secondary | ICD-10-CM | POA: Diagnosis not present

## 2023-05-04 DIAGNOSIS — M5136 Other intervertebral disc degeneration, lumbar region with discogenic back pain only: Secondary | ICD-10-CM | POA: Diagnosis not present

## 2023-05-04 DIAGNOSIS — M4854XA Collapsed vertebra, not elsewhere classified, thoracic region, initial encounter for fracture: Secondary | ICD-10-CM | POA: Diagnosis not present

## 2023-05-04 DIAGNOSIS — S32009K Unspecified fracture of unspecified lumbar vertebra, subsequent encounter for fracture with nonunion: Secondary | ICD-10-CM | POA: Diagnosis not present

## 2023-05-04 DIAGNOSIS — R29890 Loss of height: Secondary | ICD-10-CM | POA: Diagnosis not present

## 2023-05-10 DIAGNOSIS — E119 Type 2 diabetes mellitus without complications: Secondary | ICD-10-CM | POA: Diagnosis not present

## 2023-05-10 DIAGNOSIS — H43393 Other vitreous opacities, bilateral: Secondary | ICD-10-CM | POA: Diagnosis not present

## 2023-05-12 DIAGNOSIS — Z6828 Body mass index (BMI) 28.0-28.9, adult: Secondary | ICD-10-CM | POA: Diagnosis not present

## 2023-05-12 DIAGNOSIS — M25551 Pain in right hip: Secondary | ICD-10-CM | POA: Diagnosis not present

## 2023-05-12 NOTE — Progress Notes (Signed)
  Electrophysiology Office Note:   Date:  05/18/2023  ID:  [name], DOB 31-Mar-1955, MRN [id number]  Primary Cardiologist: Peter Swaziland, MD Primary Heart Failure: None Electrophysiologist: Richardo Chandler, MD       History of Present Illness:   [name] is a 68 y.o. male with h/o LBBB, HFrEF s/p CRT-D, CAD s/p MI with RCA stent> CABG x4, HTN, HLD, GERD, anxiety, chronic pain seen today for routine electrophysiology followup.   Device Clinic received an alert that he met RRT as of April 28, 2023.   Since last being seen in our clinic the patient reports doing he has been doing well short of daily alerts regarding his device battery being low.  No specific device related concerns.  He denies chest pain, palpitations, dyspnea, PND, orthopnea, nausea, vomiting, dizziness, syncope, edema, weight gain, or early satiety.   Review of systems complete and found to be negative unless listed in HPI.   EP Information / Studies Reviewed:    EKG is not ordered today. EKG from 03/12/23 reviewed which showed ASVP 81 bpm      ICD Interrogation-  reviewed in detail today,  See PACEART report.  Device History: Medtronic BiV ICD implanted 03/11/2018 for HFrEF History of appropriate therapy: No History of AAD therapy: No   Risk Assessment/Calculations:              Physical Exam:   VS:  BP 122/72   Pulse 68   Ht 6\' 1"  (1.854 m)   Wt 218 lb (98.9 kg)   SpO2 97%   BMI 28.76 kg/m    Wt Readings from Last 3 Encounters:  05/18/23 218 lb (98.9 kg)  04/08/23 219 lb (99.3 kg)  03/12/23 217 lb 6.4 oz (98.6 kg)     GEN: Well nourished, well developed in no acute distress NECK: No JVD; No carotid bruits CARDIAC: Regular rate and rhythm, no murmurs, rubs, gallops, no tethering RESPIRATORY:  Clear to auscultation without rales, wheezing or rhonchi  ABDOMEN: Soft, non-tender, non-distended EXTREMITIES:  No edema; No deformity   ASSESSMENT AND PLAN:    Chronic Systolic Dysfunction s/p  Medtronic CRT-D  ICM s/p CABG x4  Improved LVEF post CRT-D  -euvolemic on exam and by thoracic impedance  -Stable on an appropriate medical regimen -Normal ICD function -See Pace Art report -No changes today -GDMT per Cardiology  -plan for generator change and transition of care to Dr. Daneil Dunker on 06/30/23 as planned  -reviewed instructions for procedure with patient   Disposition:   Follow up with Dr. Daneil Dunker as planned on 06/30/23 for generator change    Signed, Creighton Doffing, NP-C, AGACNP-BC [facility] - Electrophysiology  05/18/2023, 1:27 PM

## 2023-05-13 DIAGNOSIS — G8929 Other chronic pain: Secondary | ICD-10-CM | POA: Diagnosis not present

## 2023-05-13 DIAGNOSIS — M25561 Pain in right knee: Secondary | ICD-10-CM | POA: Diagnosis not present

## 2023-05-13 DIAGNOSIS — Z96651 Presence of right artificial knee joint: Secondary | ICD-10-CM | POA: Diagnosis not present

## 2023-05-18 ENCOUNTER — Encounter: Payer: Self-pay | Admitting: Pulmonary Disease

## 2023-05-18 ENCOUNTER — Ambulatory Visit: Attending: Pulmonary Disease | Admitting: Pulmonary Disease

## 2023-05-18 VITALS — BP 122/72 | HR 68 | Ht 73.0 in | Wt 218.0 lb

## 2023-05-18 DIAGNOSIS — Z951 Presence of aortocoronary bypass graft: Secondary | ICD-10-CM

## 2023-05-18 DIAGNOSIS — I25118 Atherosclerotic heart disease of native coronary artery with other forms of angina pectoris: Secondary | ICD-10-CM

## 2023-05-18 DIAGNOSIS — Z95810 Presence of automatic (implantable) cardiac defibrillator: Secondary | ICD-10-CM | POA: Diagnosis not present

## 2023-05-18 DIAGNOSIS — I5022 Chronic systolic (congestive) heart failure: Secondary | ICD-10-CM | POA: Diagnosis not present

## 2023-05-18 DIAGNOSIS — I447 Left bundle-branch block, unspecified: Secondary | ICD-10-CM | POA: Diagnosis not present

## 2023-05-18 LAB — CUP PACEART INCLINIC DEVICE CHECK
Date Time Interrogation Session: 20250513131223
Implantable Lead Connection Status: 753985
Implantable Lead Connection Status: 753985
Implantable Lead Connection Status: 753985
Implantable Lead Implant Date: 20190610
Implantable Lead Implant Date: 20200306
Implantable Lead Implant Date: 20200306
Implantable Lead Location: 753858
Implantable Lead Location: 753859
Implantable Lead Location: 753860
Implantable Lead Model: 5071
Implantable Lead Model: 5076
Implantable Pulse Generator Implant Date: 20200306

## 2023-05-18 NOTE — Progress Notes (Unsigned)
 Cardiology Office Note:    Date:  05/21/2023   ID:  BEACHER GIGLIA, DOB 10-30-1955, MRN [id number]  PCP:  Tye Gall, MD  Cardiologist:  Magdalen Cabana [place], MD  Electrophysiologist:  Richardo Chandler, MD   Referring MD: Tye Gall, *   Chief Complaint  Patient presents with   Congestive Heart Failure   Coronary Artery Disease     History of Present Illness:    [name] is a 68 y.o. male with a hx of CAD s/p CABG, hypertension, left bundle branch block, hyperlipidemia, and chronic systolic heart failure.  Patient was admitted in June 2019 with flash pulmonary edema.  He was ruled in for NSTEMI and underwent cardiac catheterization which showed severe three-vessel CAD. He underwent CABG by Dr Matt Song with LIMA to LAD, SVG to diagonal, SVG to ramus intermediate, and SVG to RCA.  He had a syncope in June 2019 while waiting in the neurology office due to urethral stricture.  He was noted to have wide-complex tachycardia on telemetry with heart rate of 170.  EF was 35 to 40%.  Lower extremity Doppler was negative for DVT.  CT of the chest was negative for PE.  He was seen by EP, the underlying rhythm could be either VT versus NSVT.  He was fitted with LifeVest.  Medication was cut back due to mild hypotension and orthostasis.  Repeat echocardiogram in September 2019 showed persistently low EF of 25 to 30%.  He was unable to tolerate Entresto , spironolactone  and carvedilol  due to orthostatic symptom and the lightheadedness.  Patient eventually underwent BIV ICD implantation in March 2020 by Dr. Rodolfo Clan after failing another trial of medical therapy.  Since then, repeat echocardiogram in July 2020 did show significant improvement in EF to 40 to 45%.  Was seen by EP on 08/14/20.  Noted persistent chest pain. Also notes increased dyspnea. Mild edema. Was given lasix  which he has taken a couple of times. Myoview  ordered. This showed EF drop to 17% with large inferolateral scar and  anteroapical ischemia. Repeat Echo is showed EF 40%.  Cardiac cath was done. This showed all grafts patent except for SVG to ramus which was occluded.  Right heart pressures, LV filling pressures and CO were normal. Recommended addition of SGLT 2 inhibitor. He did have lumbar surgery in September 2022.   He was  seen in the office on 06/20/2021 ordered a myriad of concerns including fatigue, lightheadedness, intermittent low heart rate, dyspnea on exertion, and multiple episodes of presyncope. Additionally, he reported bilateral lower extremity edema, right lower leg pain, and intermittent cramping in his calves with activity.  He was scheduled for outpatient cardiac catheterization for further evaluation. He, however, presented to the ED on 06/23/2021 with complaints of shortness of breath.  He was hospitalized from 6/19-6/21/2023 in the setting of acute heart failure exacerbation/CAD with anginal equivalent.  Troponin was mildly elevated. Chest x-ray showed mild CHF, small left pleural effusion. He received IV Lasix .  Lower extremity duplex was negative for DVT.  CTA of the chest was negative for PE.  He underwent cardiac catheterization on 06/24/2021 which revealed stable three-vessel CAD, known occluded SVG-RI, otherwise patent grafts, progression of proximal ramus intermedius lesion  to 80% with positive FFR s/p DES, RHC with relatively normal pressures.    He is now at [facility] on his ICD. Plans to have generator change out on June 25 by Dr Daneil Dunker. He is still riding his bike and does well with this. He still  has random SOB when going up stairs. His weight has been steady within 3 lbs. Doesn't always take his lasix .    Echocardiogram 04/01/2023 showed stable LVEF at 30-35%, intermediate diastolic parameters, and mild-moderate left atrial dilation.  He was also noted to have mild dilation of his ascending aorta measuring 41 mm.  No significant changed findings were noted.   He reports renal colic beginning  yesterday. Has history of renal stones. Notes mild dysuria and some fever last night. Is scheduled for a nerve block in his right leg and back soon. He is taking low dose Farxiga 2.5 mg daily. States on higher dose he was very weak and couldn't get out of bed.    Past Medical History:  Diagnosis Date   AICD (automatic cardioverter/defibrillator) present    Anxiety    Arthritis    "mild; back, neck, spine" (06/09/2017)   CHF (congestive heart failure) (HCC)    Chronic back pain    Coronary artery disease    GERD (gastroesophageal reflux disease)    History of gout    History of kidney stones X 2   Hypercholesterolemia    Hypertension    LBBB (left bundle branch block)    MI (myocardial infarction) (HCC) 1993   LATERAL   MI (myocardial infarction) (HCC) ?09/2001; 06/07/2017   Pneumonia    "several times" (06/09/2017)   Pre-diabetes    S/P coronary artery stent placement    RCA   Varicose veins     Past Surgical History:  Procedure Laterality Date   ANTERIOR CERVICAL DECOMP/DISCECTOMY FUSION  03/03/2011   Procedure: ANTERIOR CERVICAL DECOMPRESSION/DISCECTOMY FUSION 3 LEVELS;  Surgeon: Adelbert Adler, MD;  Location: [facility] NEURO ORS;  Service: Neurosurgery;  Laterality: N/A;  Cervical three-four Cervical four-five Cervical five-six Anterior cervical decompression/diskectomy, fusion   APPLICATION OF ROBOTIC ASSISTANCE FOR SPINAL PROCEDURE N/A 10/03/2020   Procedure: APPLICATION OF ROBOTIC ASSISTANCE FOR SPINAL PROCEDURE;  Surgeon: Augusto Blonder, MD;  Location: [facility] OR;  Service: Neurosurgery;  Laterality: N/A;   BACK SURGERY     CARDIAC CATHETERIZATION  2009   Stents in RCA patent. 70 to 80% PL, and 50% ostial PD.    CARDIAC CATHETERIZATION N/A 11/20/2014   Procedure: Left Heart Cath and Coronary Angiography;  Surgeon: Deryl Giroux M [place], MD;  Location: [facility] INVASIVE CV LAB;  Service: Cardiovascular;  Laterality: N/A;   CORONARY ANGIOPLASTY     DIRECT ANGIOPLASTY THE MARGINAL BRANCH    CORONARY ANGIOPLASTY WITH STENT PLACEMENT     RCA   CORONARY ARTERY BYPASS GRAFT N/A 06/14/2017   Procedure: CORONARY ARTERY BYPASS GRAFTING (CABG) x four, using left internal mammary artery and right leg greater saphenous vein harvested endoscopically;  Surgeon: Heriberto London, MD;  Location: [facility] OR;  Service: Open Heart Surgery;  Laterality: N/A;   CORONARY PRESSURE/FFR STUDY N/A 06/24/2021   Procedure: INTRAVASCULAR PRESSURE WIRE/FFR STUDY;  Surgeon: Arleen Lacer, MD;  Location: [facility] INVASIVE CV LAB;  Service: Cardiovascular;  Laterality: N/A;   CORONARY STENT INTERVENTION N/A 06/24/2021   Procedure: CORONARY STENT INTERVENTION;  Surgeon: Arleen Lacer, MD;  Location: [facility] INVASIVE CV LAB;  Service: Cardiovascular;  Laterality: N/A;   CYSTOSCOPY N/A 06/14/2017   Procedure: CYSTOSCOPY;  Surgeon: Heriberto London, MD;  Location: [facility] OR;  Service: Open Heart Surgery;  Laterality: N/A;   ICD IMPLANT N/A 03/11/2018   Procedure: ICD IMPLANT - Dual Chamber;  Surgeon: Verona Goodwill, MD;  Location: [facility] INVASIVE CV LAB;  Service: Cardiovascular;  Laterality: N/A;   INSERTION OF SUPRAPUBIC CATHETER N/A 06/14/2017   Procedure: INSERTION OF SUPRAPUBIC CATHETER - Lower abdomen;  Surgeon: Heriberto London, MD;  Location: [facility] OR;  Service: Open Heart Surgery;  Laterality: N/A;   POSTERIOR LUMBAR FUSION  10/2011   RIGHT/LEFT HEART CATH AND CORONARY ANGIOGRAPHY N/A 06/08/2017   Procedure: RIGHT/LEFT HEART CATH AND CORONARY ANGIOGRAPHY;  Surgeon: [place], Zita Ozimek M, MD;  Location: [facility] INVASIVE CV LAB;  Service: Cardiovascular;  Laterality: N/A;   RIGHT/LEFT HEART CATH AND CORONARY/GRAFT ANGIOGRAPHY N/A 09/06/2020   Procedure: RIGHT/LEFT HEART CATH AND CORONARY/GRAFT ANGIOGRAPHY;  Surgeon: [place], Aniela Caniglia M, MD;  Location: [facility] INVASIVE CV LAB;  Service: Cardiovascular;  Laterality: N/A;   RIGHT/LEFT HEART CATH AND CORONARY/GRAFT ANGIOGRAPHY N/A 06/24/2021   Procedure: RIGHT/LEFT HEART CATH AND CORONARY/GRAFT ANGIOGRAPHY;   Surgeon: Arleen Lacer, MD;  Location: [facility] INVASIVE CV LAB;  Service: Cardiovascular;  Laterality: N/A;   TEE WITHOUT CARDIOVERSION N/A 06/14/2017   Procedure: TRANSESOPHAGEAL ECHOCARDIOGRAM (TEE);  Surgeon: Matt Song, Donata Fryer, MD;  Location: [facility] OR;  Service: Open Heart Surgery;  Laterality: N/A;   TOTAL KNEE ARTHROPLASTY Right 12/17/2021   Procedure: TOTAL KNEE ARTHROPLASTY;  Surgeon: Genevie Kerns, MD;  Location: [facility] ORS;  Service: Orthopedics;  Laterality: Right;  adductor canal  120   URETHROPLASTY N/A 10/15/2017   Procedure: URETHROPLASTY WITH BUCCAL GRAFT HARVAST;  Surgeon: Andrez Banker, MD;  Location: [facility] ORS;  Service: Urology;  Laterality: N/A;    Current Medications: Current Meds  Medication Sig   acetaminophen  (TYLENOL ) 500 MG tablet Take 1,000 mg by mouth 3 (three) times daily. Pt. Reports he is taking half   Ascorbic Acid (SUPER C COMPLEX PO) Take 1 tablet by mouth 3 (three) times daily.   aspirin  81 MG EC tablet Take 81 mg by mouth in the morning.   carvedilol  (COREG ) 6.25 MG tablet TAKE 1 TABLET BY MOUTH 2 TIMES DAILY WITH A meal **Must have office visit FOR additional refills**   cetirizine (ZYRTEC) 10 MG tablet Take 10 mg by mouth daily as needed for allergies.   dapagliflozin propanediol (FARXIGA) 5 MG TABS tablet Take 1 tablet by mouth daily.   furosemide  (LASIX ) 40 MG tablet Take 20 mg by mouth 2 (two) times daily. OK TO TAKE EXTRA TAB FOR WEIGHT GAIN 2-3 LBS/DAY OR 5LBS/WEEKLY   Misc Natural Products (DEEP SLEEP) CAPS Take 2 capsules by mouth at bedtime.   Multiple Vitamins-Minerals (AIRBORNE GUMMIES PO) Take 3 capsules by mouth 2 (two) times daily.   nitroGLYCERIN  (NITROSTAT ) 0.4 MG SL tablet Place 1 tablet (0.4 mg total) under the tongue every 5 (five) minutes x 3 doses as needed for chest pain.   pantoprazole  (PROTONIX ) 40 MG tablet TAKE 1 TABLET BY MOUTH EVERY DAY   REPATHA  SURECLICK 140 MG/ML SOAJ INJECT into THE SKIN EVERY 14 DAYS   valsartan  (DIOVAN ) 40 MG  tablet TAKE 1 TABLET BY MOUTH ONCE DAILY   zolpidem  (AMBIEN ) 10 MG tablet TAKE 1 TABLET BY MOUTH NIGHTLY AT BEDTIME     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  Tobacco Use   Smoking status: Former    Current packs/day: 0.00    Average packs/day: 2.0 packs/day  for 20.0 years (40.0 ttl pk-yrs)    Types: Cigarettes    Start date: 05/07/1979    Quit date: 05/07/1999    Years since quitting: 24.0   Smokeless tobacco: Never  Vaping Use   Vaping status: Never Used  Substance and Sexual Activity   Alcohol use: Not Currently    Comment: 2-3 per month (occasionally)   Drug use: Never   Sexual activity: Yes    Partners: Female    Comment: married  Other Topics Concern   Not on file  Social History Narrative   Diet      Do you drink/eat things with caffeine  No      Marital Status Married What year were you married?  1987      Do you live in a house, apartment, assisted living, condo, trailer, etc.?  House      Is it one or more stories?  2 stories      How many persons live in your home?  2         Do you have any pets in your home?(please list)  No      Highest level of education completed:  11 Years      Current or past profession:  Sales-past      Do you exercise?:  YES    Type and how often:  Cardio and weights/At least everyday      Do you have a Living Will? (Form that indicates scenarios where you would not want your life prolonged)  YES      Do you have a DNR form?  YES       If not, would you like to discuss one?      Do you have signed POA/HPOA forms?  NO      Do you have difficulty bathing or dressing yourself?  NO      Do you have difficulty preparing food or eating?  NO      Do you have difficulty managing  medications?  NO      Do you have difficulty managing your finances?  NO      Do you have difficulty affording your medications?  NO                     Social Drivers of Corporate investment banker Strain: Low Risk  (01/09/2021)   Overall Financial Resource Strain (CARDIA)    Difficulty of Paying Living Expenses: Not very hard  Food Insecurity: Food Insecurity Present (12/17/2021)   Hunger Vital Sign    Worried About Running Out of Food in the Last Year: Never true    Ran Out of Food in the Last Year: Sometimes true  Transportation Needs: No Transportation Needs (12/24/2021)   PRAPARE - Administrator, Civil Service (Medical): No    Lack of Transportation (Non-Medical): No  Physical Activity: Not on file  Stress: Not on file  Social Connections: Unknown (05/19/2021)   [facility]   Social Network    Social Network: Not on file     Family History: The patient's family history includes Breast cancer in his mother; Coronary artery disease in his mother; Diabetes in his father; Heart attack in his brother, father, and maternal grandfather; Heart disease in his brother; Stroke in his father.  ROS:   Please see the history of present illness.     All other systems reviewed and are negative.  EKGs/Labs/Other  Studies Reviewed:    The following studies were reviewed today:  Echo 07/11/2018 1. The left ventricle has mild-moderately reduced systolic function, with an ejection fraction of 40-45%. The cavity size was normal. Left ventricular diastolic Doppler parameters are indeterminate. Indeterminate filling pressures.  2. The right ventricle has normal systolic function. The cavity was normal. There is no increase in right ventricular wall thickness. Right ventricular systolic pressure could not be assessed.  3. Left atrial size was mildly dilated.  4. Aortic valve regurgitation is mild to moderate by color flow Doppler. No stenosis of the  aortic valve.  5. The Ascending aorta measures 39 mm, within normal limits when indexed to body surface area.  6. When compared to the prior study: 01/21/2018- LV ejection fraction has improved. Ventricular septal contraction is less dysynchronous. Aortic valve regurgitation has not significantly changed. Side by side comparison of images performed.  Myoview  08/21/20: Study Highlights    Nuclear stress EF: 17%. The left ventricular ejection fraction is severely decreased (<30%). There was no ST segment deviation noted during stress. Defect 1: There is a large defect of severe severity present in the basal inferior, basal inferolateral, mid inferior, mid inferolateral and apical inferior location. Defect 2: There is a small defect of mild severity present in the apical anterior location. This is a high risk study. Findings consistent with ischemia and prior myocardial infarction.   High risk study due to severely decreased left ventricular function. There is a large (old) inferolateral scar. There is a small area of mild anteroapical ischemia. There has been a marked reduction in left ventricular systolic function since 2018.  Recommend correlation with echo and consider possible "balanced ischemia". Compared to ECG from 02/05/2020, there is a marked reduction in the amplitude of the initial R wave in V1, although the initial deflection is still positive. Visually, there appears to be substantial septal-lateral dyssynchrony. Consider reevaluation of CRT device settings.    Echo 08/30/20: IMPRESSIONS     1. Poor acoustic windows limit study. Definity  used. LVEF is moderately  depressed with diffuse hypokinesis worse in the annteiror and lateral  walls. LVEF is approximately 40%. The left ventricle has moderately  decreased function. The left ventricular  internal cavity size was severely dilated. There is mild left ventricular  hypertrophy. Indeterminate diastolic filling due to E-A fusion.    2. Right ventricular systolic function is normal. The right ventricular  size is normal.   3. Mild mitral valve regurgitation.   4. Aortic valve regurgitation is mild. Mild aortic valve sclerosis is  present, with no evidence of aortic valve stenosis.   5. Aortic dilatation noted. There is mild dilatation of the aortic root,  measuring 42 mm.   Comparison(s): The left ventricular function is unchanged.   Cardiac cath 09/06/20:  RIGHT/LEFT HEART CATH AND CORONARY/GRAFT ANGIOGRAPHY   Conclusion      Ost LM to Mid LM lesion is 40% stenosed.   Prox LAD to Mid LAD lesion is 100% stenosed.   Ramus lesion is 60% stenosed.   Prox RCA lesion is 90% stenosed.   Origin lesion is 100% stenosed.   SVG graft was visualized by angiography and is normal in caliber.   SVG graft was visualized by angiography.   SVG graft was visualized by angiography and is normal in caliber.   LIMA graft was visualized by angiography and is normal in caliber.   The graft exhibits no disease.   The graft exhibits no disease.   The graft  exhibits no disease.   LV end diastolic pressure is normal.   There is no aortic valve stenosis.   3 vessel CAD.  Patent LIMA to the LAD.  Patent SVG to the first diagonal Patent SVG to the PDA Occluded SVG to the Ramus intermediate Normal LV filling pressures Normal right heart pressures Normal cardiac output   Plan: recommend continued medical management. The lesion in the ramus intermediate is moderate - in some views appears <40% and other views about 60-65%. This is certainly not critical. Good flow down the native vessel is probably the reason for graft closure. His EF by Echo is in line with prior Echo in 2020. Based on current findings I cannot explain his dyspnea. Would consider outpatient sleep study and/or PFTs to consider other causes. We will add Jardiance  for his CHF and DM. He does not need diuresis.    Echo 06/16/21: IMPRESSIONS     1. Left ventricular  ejection fraction, by estimation, is 30 to 35%. Left  ventricular ejection fraction by 3D volume is 31 %. The left ventricle has  moderately decreased function. The left ventricle demonstrates global  hypokinesis. The left ventricular  internal cavity size was moderately dilated. Left ventricular diastolic  parameters are consistent with Grade I diastolic dysfunction (impaired  relaxation).   2. Right ventricular systolic function is mildly reduced. The right  ventricular size is normal. There is normal pulmonary artery systolic  pressure. The estimated right ventricular systolic pressure is 33.2 mmHg.   3. Left atrial size was mild to moderately dilated.   4. The mitral valve is grossly normal. Mild mitral valve regurgitation.  No evidence of mitral stenosis.   5. The aortic valve is tricuspid. Aortic valve regurgitation is mild. No  aortic stenosis is present.   6. There is mild dilatation of the ascending aorta, measuring 41 mm.   7. The inferior vena cava is normal in size with greater than 50%  respiratory variability, suggesting right atrial pressure of 3 mmHg.   Comparison(s): Changes from prior study are noted. The left ventricular  function is worsened.    Cardiac cath 06/24/21: Procedures  CORONARY STENT INTERVENTION  INTRAVASCULAR PRESSURE WIRE/FFR STUDY  RIGHT/LEFT HEART CATH AND CORONARY/GRAFT ANGIOGRAPHY   Conclusion      -------------- NATIVE CORONARIES------------   Prox RCA lesion is 90% stenosed.  Dist RCA lesion is 90% stenosed.   Ost LM to Mid LM lesion is 40% stenosed.   Prox LAD lesion is 100% stenosed prior to D1. Prox LAD to Mid LAD lesion is 100% stenosed following D1.   CULPRIT LESION: Ramus lesion is 75% stenosed.  (Borderline positive by RFR (0.91) and FFR 0.80) -> given the patient's symptoms, this is felt to be significant.   A drug-eluting stent was successfully placed using a SYNERGY XD 3.0X16 -> deployed to 3.3 mm.   Post intervention, there is a  0% residual stenosis.   -------------- GRAFTS -------------------------------   LIMA-LAD graft was visualized by angiography and is normal in caliber. The graft exhibits no disease. There is no competitive flow   SVG-D1 graft was visualized by angiography and is normal in caliber. The graft exhibits no disease, and the flow is not reversed.  There is retrograde filling of a major septal perforator trunk in the LAD that it would otherwise not be perfused by the native LAD or retrograde filling from the LIMA..   SVG-rPDA graft was visualized by angiography and is moderate in size.  The graft exhibits  no disease. There is trivial competitive flow   SVG-RI graft was visualized by angiography, Origin lesion is 100% stenosed.   -------------- HEMODYNAMICS-----------------   LV end diastolic pressure is normal.   There is no aortic valve stenosis.   POST-OPERATIVE DIAGNOSIS:   Stable Three-Vessel CAD: Ostial mid OM 40%.  Proximal to mid LAD 100%.  Proximal RCA 90% at bend with trace filling of the PDA and PL.  Patent LCx that is small and nondominant.   Culprit lesion: Progression of proximal Ramus Intermedius (RI) lesion from 60% to 70-80% -> RFR 0.90, 0.91 with a FFR of 0.80.  Based on his symptoms thought to be significant. Successful DES PCI of ostial and proximal L RI using Synergy DES 3.0 mm x 16 mm deployed to 3.3 mm. Reducing lesion from 70-80% to 0% TIMI-3 flow pre and post.   Known occluded SVG-RI with otherwise patent SVG-PDA, SVG-D1, and LIMA-LAD RHC pressures relatively normal:  RAP 2 mmHg, RVP-EDP 28/0-0 mmHg; PAP-mean 19/10-15 mmHg, PCWP 16 mmHg; LVP-EDP 120/1-12 mmHg,  AoP-MAP 116/54 mmHg - 72 mmHg; Ao sat 98%, PA sat 69% Cardiac Output-Index: Fick 5.32-2.33; TD 3.21-1.4 (suggestive of valvular disease.   PLAN OF CARE:  Return to nursing floor for post PCI care after sheath removal. ->  Dual and platelet therapy for 6 months, then okay to stop aspirin .  Seems relatively euvolemic on  right heart cath.     Randene Bustard, MD  Right Heart  Right Heart Pressures PAP-mean 19/10-15 mmHg,  PCWP 16 mmHg;  LV EDP is normal. Minimally elevated: LVP-EDP 120/1-12 mmHg,  RHC pressures relatively normal:   AoP-MAP 116/54 mmHg - 72 mmHg;   Ao sat 98%, PA sat 69%  Cardiac Output-Index: Fick 5.32-2.33; TD 3.21-1.4 (suggestive of valvular disease.  Right Atrium Right atrial pressure is normal. RAP 2 mmHg,  Right Ventricle RVP-EDP 28/0-0 mmHg;  Coronary Diagrams  Diagnostic Dominance: Right  Intervention   Echo 04/01/23: IMPRESSIONS     1. Left ventricular ejection fraction, by estimation, is 30 to 35%. The  left ventricle has moderately decreased function. The left ventricle  demonstrates global hypokinesis. The left ventricular internal cavity size  was severely dilated. There is mild  left ventricular hypertrophy. Left ventricular diastolic parameters are  indeterminate.   2. Right ventricular systolic function is normal. The right ventricular  size is not well visualized.   3. Left atrial size was mild to moderately dilated.   4. The mitral valve is normal in structure. Trivial mitral valve  regurgitation. No evidence of mitral stenosis.   5. The aortic valve is tricuspid. Aortic valve regurgitation is mild to  moderate. No aortic stenosis is present.   6. Aortic dilatation noted. There is mild dilatation of the ascending  aorta, measuring 41 mm.   7. The inferior vena cava is normal in size with greater than 50%  respiratory variability, suggesting right atrial pressure of 3 mmHg.   Comparison(s): No significant change from prior study. Prior images  reviewed side by side.   EKG:  EKG is not ordered today.   Recent Labs: No results found for requested labs within last 365 days.  Recent Lipid Panel    Component Value Date/Time   CHOL 154 05/18/2022 1027   TRIG 207 (H) 05/18/2022 1027   HDL 45 05/18/2022 1027   CHOLHDL 3.4 05/18/2022 1027   CHOLHDL 2.6  10/29/2017 0939   VLDL 27 10/29/2017 0939   LDLCALC 75 05/18/2022 1027   Dated 03/11/23: cholesterol  152, triglycerides 124, HDL 60, LDL 71. A1c 7.4%. CMET and CBC OK.  Physical Exam:    VS:  BP 120/78   Pulse 84   Ht 6\' 1"  (1.854 m)   Wt 220 lb 9.6 oz (100.1 kg)   SpO2 96%   BMI 29.10 kg/m     Wt Readings from Last 3 Encounters:  05/21/23 220 lb 9.6 oz (100.1 kg)  05/18/23 218 lb (98.9 kg)  04/08/23 219 lb (99.3 kg)     GEN:  Well nourished, well developed in no acute distress HEENT: Normal NECK: No JVD; No carotid bruits LYMPHATICS: No lymphadenopathy CARDIAC: RRR, no murmurs, rubs, gallops RESPIRATORY:  Clear to auscultation without rales, wheezing or rhonchi  ABDOMEN: Soft, non-tender, non-distended MUSCULOSKELETAL: tr edema; No deformity  SKIN: Warm and dry NEUROLOGIC:  Alert and oriented x 3 PSYCHIATRIC:  Normal affect   ASSESSMENT:    1. Dysuria   2. Chronic systolic heart failure (HCC)   3. S/P CABG x 4   4. LBBB (left bundle branch block)   5. Essential hypertension   6. Hyperlipidemia LDL goal <70          PLAN:    In order of problems listed above:  CAD s/p CABG 2019: Abnormal Myoview  with large inferolateral scar and anteroapical ischemia Aug 2022.  Cardiac cath showed patent grafts except SVG to ramus which was occluded. More recent admission in June 2023 with stenting of native ramus intermediate. Now on ASA only. No significant angina.  Hyperlipidemia: On Repatha . Statin intolerant. Last LDL 71  Acute on Chronic combined systolic/diastolic  heart failure: Last EF by Echo was 35-40% and is s/p BiV ICD. On Coreg  and valsartan  at low doses. Entresto  resulted in low BPs. Unable to afford Jardiance .  On low dose Farxiga. Right heart cath normal right heart and LV filling pressures. Normal cardiac output. Encouraged him to take lasix  daily.   Status post biventricular ICD: Followed by EP. Plan generator change out next month  GERD  6.   Lumbar  disc disease. S/p surgery  7.  S/p right TKR  8.  Dysuria and flank pain. ? Due to renal colic. Will check UA and culture to make sure there is no infection. Follow up with urology  9. DM A1c 7.4%. follow up with PCP.     Medication Adjustments/Labs and Tests Ordered: Current medicines are reviewed at length with the patient today.  Concerns regarding medicines are outlined above.  No orders of the defined types were placed in this encounter.   Meds ordered this encounter  Medications   DULoxetine (CYMBALTA) 20 MG capsule    Sig: Take 2 capsules (40 mg total) by mouth daily.     There are no Patient Instructions on file for this visit. Follow up in 6 months  Signed, Gillermo Poch [place], MD  05/21/2023 8:43 AM    [facility]

## 2023-05-18 NOTE — Patient Instructions (Signed)
 Medication Instructions:  No changes *If you need a refill on your cardiac medications before your next appointment, please call your pharmacy*  Lab Work: Today we are going to draw CBC and Bmet If you have labs (blood work) drawn today and your tests are completely normal, you will receive your results only by: MyChart Message (if you have MyChart) OR A paper copy in the mail If you have any lab test that is abnormal or we need to change your treatment, we will call you to review the results.  Testing/Procedures: No testing  Follow-Up: At [facility], you and your health needs are our priority.  As part of our continuing mission to provide you with exceptional heart care, our providers are all part of one team.  This team includes your primary Cardiologist (physician) and Advanced Practice Providers or APPs (Physician Assistants and Nurse Practitioners) who all work together to provide you with the care you need, when you need it.  Your next appointment:   This will be on your discharge summery when you leave the hospital  We recommend signing up for the patient portal called "MyChart".  Sign up information is provided on this After Visit Summary.  MyChart is used to connect with patients for Virtual Visits (Telemedicine).  Patients are able to view lab/test results, encounter notes, upcoming appointments, etc.  Non-urgent messages can be sent to your provider as well.   To learn more about what you can do with MyChart, go to [email or web address].

## 2023-05-20 ENCOUNTER — Ambulatory Visit (INDEPENDENT_AMBULATORY_CARE_PROVIDER_SITE_OTHER): Payer: PPO

## 2023-05-20 DIAGNOSIS — I447 Left bundle-branch block, unspecified: Secondary | ICD-10-CM

## 2023-05-20 LAB — CUP PACEART REMOTE DEVICE CHECK
Battery Remaining Longevity: 1 mo
Battery Voltage: 2.69 V
Brady Statistic AP VP Percent: 0.25 %
Brady Statistic AP VS Percent: 0.09 %
Brady Statistic AS VP Percent: 99.35 %
Brady Statistic AS VS Percent: 0.32 %
Brady Statistic RA Percent Paced: 0.33 %
Brady Statistic RV Percent Paced: 98.84 %
Date Time Interrogation Session: 20250515012405
HighPow Impedance: 85 Ohm
Implantable Lead Connection Status: 753985
Implantable Lead Connection Status: 753985
Implantable Lead Connection Status: 753985
Implantable Lead Implant Date: 20190610
Implantable Lead Implant Date: 20200306
Implantable Lead Implant Date: 20200306
Implantable Lead Location: 753858
Implantable Lead Location: 753859
Implantable Lead Location: 753860
Implantable Lead Model: 5071
Implantable Lead Model: 5076
Implantable Pulse Generator Implant Date: 20200306
Lead Channel Impedance Value: 323 Ohm
Lead Channel Impedance Value: 399 Ohm
Lead Channel Impedance Value: 4047 Ohm
Lead Channel Impedance Value: 4047 Ohm
Lead Channel Impedance Value: 437 Ohm
Lead Channel Impedance Value: 494 Ohm
Lead Channel Pacing Threshold Amplitude: 0.625 V
Lead Channel Pacing Threshold Amplitude: 0.75 V
Lead Channel Pacing Threshold Amplitude: 2 V
Lead Channel Pacing Threshold Pulse Width: 0.4 ms
Lead Channel Pacing Threshold Pulse Width: 0.4 ms
Lead Channel Pacing Threshold Pulse Width: 1 ms
Lead Channel Sensing Intrinsic Amplitude: 3.625 mV
Lead Channel Sensing Intrinsic Amplitude: 3.625 mV
Lead Channel Sensing Intrinsic Amplitude: 31.625 mV
Lead Channel Sensing Intrinsic Amplitude: 31.625 mV
Lead Channel Setting Pacing Amplitude: 1.5 V
Lead Channel Setting Pacing Amplitude: 2.5 V
Lead Channel Setting Pacing Amplitude: 3 V
Lead Channel Setting Pacing Pulse Width: 0.4 ms
Lead Channel Setting Pacing Pulse Width: 1 ms
Lead Channel Setting Sensing Sensitivity: 0.6 mV
Zone Setting Status: 755011
Zone Setting Status: 755011

## 2023-05-21 ENCOUNTER — Encounter: Payer: Self-pay | Admitting: Cardiology

## 2023-05-21 ENCOUNTER — Ambulatory Visit: Attending: Cardiology | Admitting: Cardiology

## 2023-05-21 VITALS — BP 120/78 | HR 84 | Ht 73.0 in | Wt 220.6 lb

## 2023-05-21 DIAGNOSIS — R300 Dysuria: Secondary | ICD-10-CM | POA: Diagnosis not present

## 2023-05-21 DIAGNOSIS — I5022 Chronic systolic (congestive) heart failure: Secondary | ICD-10-CM | POA: Diagnosis not present

## 2023-05-21 DIAGNOSIS — I10 Essential (primary) hypertension: Secondary | ICD-10-CM

## 2023-05-21 DIAGNOSIS — I447 Left bundle-branch block, unspecified: Secondary | ICD-10-CM

## 2023-05-21 DIAGNOSIS — Z951 Presence of aortocoronary bypass graft: Secondary | ICD-10-CM

## 2023-05-21 DIAGNOSIS — E785 Hyperlipidemia, unspecified: Secondary | ICD-10-CM | POA: Diagnosis not present

## 2023-05-21 MED ORDER — DULOXETINE HCL 20 MG PO CPEP: 40.0000 mg | ORAL_CAPSULE | Freq: Every day | ORAL | Status: DC

## 2023-05-21 NOTE — Patient Instructions (Signed)
 Medication Instructions:  Continue same medications *If you need a refill on your cardiac medications before your next appointment, please call your pharmacy*  Lab Work: Urinalysis and Urine cultire  Testing/Procedures: None ordered  Follow-Up: At [facility], you and your health needs are our priority.  As part of our continuing mission to provide you with exceptional heart care, our providers are all part of one team.  This team includes your primary Cardiologist (physician) and Advanced Practice Providers or APPs (Physician Assistants and Nurse Practitioners) who all work together to provide you with the care you need, when you need it.  Your next appointment:  6 months    Call in July to schedule Nov appointment     Provider:  Dr.Jordan   We recommend signing up for the patient portal called "MyChart".  Sign up information is provided on this After Visit Summary.  MyChart is used to connect with patients for Virtual Visits (Telemedicine).  Patients are able to view lab/test results, encounter notes, upcoming appointments, etc.  Non-urgent messages can be sent to your provider as well.   To learn more about what you can do with MyChart, go to [email or web address].

## 2023-05-22 LAB — URINALYSIS
Bilirubin, UA: NEGATIVE
Ketones, UA: NEGATIVE
Nitrite, UA: POSITIVE — AB
Specific Gravity, UA: 1.028 (ref 1.005–1.030)
Urobilinogen, Ur: 0.2 mg/dL (ref 0.2–1.0)
pH, UA: 6 (ref 5.0–7.5)

## 2023-05-23 ENCOUNTER — Ambulatory Visit: Payer: Self-pay | Admitting: Cardiology

## 2023-05-24 ENCOUNTER — Ambulatory Visit: Payer: Self-pay | Admitting: Cardiology

## 2023-05-24 ENCOUNTER — Telehealth: Payer: Self-pay | Admitting: Cardiology

## 2023-05-24 ENCOUNTER — Other Ambulatory Visit: Payer: Self-pay

## 2023-05-24 MED ORDER — CIPROFLOXACIN HCL 500 MG PO TABS: ORAL_TABLET | ORAL | 0 refills | Status: DC

## 2023-05-24 NOTE — Telephone Encounter (Signed)
 Cherylle Corwin, LPN 1/61/0960 45:40 AM EDT Back to Top    Spoke to patient results given.Advised to start Cipro  500 mg twice a day for 7 days.Advised to see his urologist.

## 2023-05-24 NOTE — Telephone Encounter (Signed)
 Patient is calling to follow up on his Urinalysis. Please advise.

## 2023-05-25 ENCOUNTER — Telehealth: Payer: Self-pay | Admitting: Cardiology

## 2023-05-25 ENCOUNTER — Other Ambulatory Visit: Payer: Self-pay

## 2023-05-25 LAB — CULTURE, URINE COMPREHENSIVE

## 2023-05-25 MED ORDER — SULFAMETHOXAZOLE-TRIMETHOPRIM 800-160 MG PO TABS: ORAL_TABLET | ORAL | 0 refills | Status: DC

## 2023-05-25 NOTE — Telephone Encounter (Signed)
 Pt c/o medication issue:  1. Name of Medication: sulfamethoxazole -trimethoprim  (BACTRIM  DS) 800-160 MG tablet   valsartan  (DIOVAN ) 40 MG tablet  2. How are you currently taking this medication (dosage and times per day)?   3. Are you having a reaction (difficulty breathing--STAT)? No  4. What is your medication issue? Pt states that per Pharmacist that there is an interaction with above medications,. Pt would like a c/b regarding this matter. Please advise

## 2023-05-25 NOTE — Telephone Encounter (Signed)
 Spoke with patient and he is aware provider states its ok to take bactrim  and valsartan . He verbalized understanding

## 2023-05-25 NOTE — Telephone Encounter (Signed)
 Spoke with patient and she states he was called in Bactrim  and his pharmacist states there is an interaction between the bactrim  and his valsartan . He would like to know if you still want him to take.

## 2023-05-26 ENCOUNTER — Ambulatory Visit: Payer: Self-pay | Admitting: Cardiology

## 2023-05-27 DIAGNOSIS — M7061 Trochanteric bursitis, right hip: Secondary | ICD-10-CM | POA: Diagnosis not present

## 2023-05-27 DIAGNOSIS — M25551 Pain in right hip: Secondary | ICD-10-CM | POA: Diagnosis not present

## 2023-05-27 DIAGNOSIS — M7062 Trochanteric bursitis, left hip: Secondary | ICD-10-CM | POA: Diagnosis not present

## 2023-05-27 DIAGNOSIS — Z6828 Body mass index (BMI) 28.0-28.9, adult: Secondary | ICD-10-CM | POA: Diagnosis not present

## 2023-06-02 DIAGNOSIS — M1711 Unilateral primary osteoarthritis, right knee: Secondary | ICD-10-CM | POA: Diagnosis not present

## 2023-06-03 NOTE — Progress Notes (Signed)
 Remote ICD transmission.

## 2023-06-03 NOTE — Addendum Note (Signed)
 Addended by: Edra Govern D on: 06/03/2023 10:55 AM   Modules accepted: Orders, Level of Service

## 2023-06-08 DIAGNOSIS — N3000 Acute cystitis without hematuria: Secondary | ICD-10-CM | POA: Diagnosis not present

## 2023-06-08 DIAGNOSIS — I5022 Chronic systolic (congestive) heart failure: Secondary | ICD-10-CM | POA: Diagnosis not present

## 2023-06-08 DIAGNOSIS — E119 Type 2 diabetes mellitus without complications: Secondary | ICD-10-CM | POA: Diagnosis not present

## 2023-06-09 DIAGNOSIS — I447 Left bundle-branch block, unspecified: Secondary | ICD-10-CM | POA: Diagnosis not present

## 2023-06-09 DIAGNOSIS — I5022 Chronic systolic (congestive) heart failure: Secondary | ICD-10-CM | POA: Diagnosis not present

## 2023-06-09 DIAGNOSIS — I25118 Atherosclerotic heart disease of native coronary artery with other forms of angina pectoris: Secondary | ICD-10-CM | POA: Diagnosis not present

## 2023-06-09 DIAGNOSIS — Z951 Presence of aortocoronary bypass graft: Secondary | ICD-10-CM | POA: Diagnosis not present

## 2023-06-09 DIAGNOSIS — Z95810 Presence of automatic (implantable) cardiac defibrillator: Secondary | ICD-10-CM | POA: Diagnosis not present

## 2023-06-09 LAB — CBC

## 2023-06-10 DIAGNOSIS — M461 Sacroiliitis, not elsewhere classified: Secondary | ICD-10-CM | POA: Diagnosis not present

## 2023-06-10 LAB — BASIC METABOLIC PANEL WITH GFR
BUN/Creatinine Ratio: 27 — ABNORMAL HIGH (ref 10–24)
BUN: 24 mg/dL (ref 8–27)
CO2: 25 mmol/L (ref 20–29)
Calcium: 9.2 mg/dL (ref 8.6–10.2)
Chloride: 101 mmol/L (ref 96–106)
Creatinine, Ser: 0.88 mg/dL (ref 0.76–1.27)
Glucose: 135 mg/dL — ABNORMAL HIGH (ref 70–99)
Potassium: 4.8 mmol/L (ref 3.5–5.2)
Sodium: 141 mmol/L (ref 134–144)
eGFR: 93 mL/min/{1.73_m2} (ref >59)

## 2023-06-10 LAB — CBC
Hematocrit: 49.1 % (ref 37.5–51.0)
Hemoglobin: 16.4 g/dL (ref 13.0–17.7)
MCH: 33.5 pg — ABNORMAL HIGH (ref 26.6–33.0)
MCHC: 33.4 g/dL (ref 31.5–35.7)
MCV: 100 fL — ABNORMAL HIGH (ref 79–97)
Platelets: 207 10*3/uL (ref 150–450)
RBC: 4.9 x10E6/uL (ref 4.14–5.80)
RDW: 12.1 % (ref 11.6–15.4)
WBC: 10.1 10*3/uL (ref 3.4–10.8)

## 2023-06-11 DIAGNOSIS — E119 Type 2 diabetes mellitus without complications: Secondary | ICD-10-CM | POA: Diagnosis not present

## 2023-06-15 DIAGNOSIS — N3000 Acute cystitis without hematuria: Secondary | ICD-10-CM | POA: Diagnosis not present

## 2023-06-15 DIAGNOSIS — R300 Dysuria: Secondary | ICD-10-CM | POA: Diagnosis not present

## 2023-06-16 DIAGNOSIS — E119 Type 2 diabetes mellitus without complications: Secondary | ICD-10-CM | POA: Diagnosis not present

## 2023-06-23 ENCOUNTER — Telehealth: Payer: Self-pay | Admitting: Cardiology

## 2023-06-23 NOTE — Telephone Encounter (Signed)
 Spoke with patient and we went over instructions for battery replacement. He verbalized understanding

## 2023-06-23 NOTE — Telephone Encounter (Signed)
 Patient wants a call back regarding instructions for his upcoming procedure.

## 2023-06-24 ENCOUNTER — Encounter: Payer: Self-pay | Admitting: Emergency Medicine

## 2023-06-28 NOTE — Progress Notes (Signed)
 Remote ICD transmission.

## 2023-06-30 ENCOUNTER — Ambulatory Visit (HOSPITAL_COMMUNITY)
Admission: RE | Disposition: A | Discharge status: Home / Self Care | Payer: Self-pay | Source: Home / Self Care | Attending: Cardiology

## 2023-06-30 ENCOUNTER — Encounter (HOSPITAL_COMMUNITY): Payer: Self-pay | Admitting: Cardiology

## 2023-06-30 ENCOUNTER — Other Ambulatory Visit: Payer: Self-pay

## 2023-06-30 ENCOUNTER — Ambulatory Visit (HOSPITAL_COMMUNITY)
Admission: RE | Admit: 2023-06-30 | Discharge: 2023-06-30 | Disposition: A | Discharge status: Home / Self Care | Attending: Cardiology | Admitting: Cardiology

## 2023-06-30 DIAGNOSIS — F419 Anxiety disorder, unspecified: Secondary | ICD-10-CM | POA: Diagnosis not present

## 2023-06-30 DIAGNOSIS — I447 Left bundle-branch block, unspecified: Secondary | ICD-10-CM | POA: Insufficient documentation

## 2023-06-30 DIAGNOSIS — I255 Ischemic cardiomyopathy: Secondary | ICD-10-CM | POA: Diagnosis not present

## 2023-06-30 DIAGNOSIS — G8929 Other chronic pain: Secondary | ICD-10-CM | POA: Insufficient documentation

## 2023-06-30 DIAGNOSIS — I110 Hypertensive heart disease with heart failure: Secondary | ICD-10-CM | POA: Diagnosis not present

## 2023-06-30 DIAGNOSIS — Z955 Presence of coronary angioplasty implant and graft: Secondary | ICD-10-CM | POA: Insufficient documentation

## 2023-06-30 DIAGNOSIS — I252 Old myocardial infarction: Secondary | ICD-10-CM | POA: Insufficient documentation

## 2023-06-30 DIAGNOSIS — K219 Gastro-esophageal reflux disease without esophagitis: Secondary | ICD-10-CM | POA: Insufficient documentation

## 2023-06-30 DIAGNOSIS — I2510 Atherosclerotic heart disease of native coronary artery without angina pectoris: Secondary | ICD-10-CM | POA: Diagnosis not present

## 2023-06-30 DIAGNOSIS — Z951 Presence of aortocoronary bypass graft: Secondary | ICD-10-CM | POA: Diagnosis not present

## 2023-06-30 DIAGNOSIS — E785 Hyperlipidemia, unspecified: Secondary | ICD-10-CM | POA: Diagnosis not present

## 2023-06-30 DIAGNOSIS — I5022 Chronic systolic (congestive) heart failure: Secondary | ICD-10-CM | POA: Diagnosis not present

## 2023-06-30 DIAGNOSIS — Z4502 Encounter for adjustment and management of automatic implantable cardiac defibrillator: Secondary | ICD-10-CM | POA: Diagnosis not present

## 2023-06-30 HISTORY — PX: BIV ICD GENERATOR CHANGEOUT: EP1194

## 2023-06-30 SURGERY — BIV ICD GENERATOR CHANGEOUT

## 2023-06-30 MED ORDER — LIDOCAINE HCL (PF) 1 % IJ SOLN
INTRAMUSCULAR | Status: DC | PRN
  Administered 2023-06-30: 30 mL

## 2023-06-30 MED ORDER — CEFAZOLIN SODIUM-DEXTROSE 2-4 GM/100ML-% IV SOLN
INTRAVENOUS | Status: AC
  Filled 2023-06-30: qty 100

## 2023-06-30 MED ORDER — SODIUM CHLORIDE 0.9 % IV SOLN
80.0000 mg | INTRAVENOUS | Status: AC
  Administered 2023-06-30: 80 mg

## 2023-06-30 MED ORDER — FENTANYL CITRATE (PF) 100 MCG/2ML IJ SOLN
INTRAMUSCULAR | Status: DC | PRN
  Administered 2023-06-30: 50 ug via INTRAVENOUS

## 2023-06-30 MED ORDER — CHLORHEXIDINE GLUCONATE 4 % EX SOLN: 4.0000 | Freq: Once | CUTANEOUS | Status: DC

## 2023-06-30 MED ORDER — ONDANSETRON HCL 4 MG/2ML IJ SOLN: 4.0000 mg | Freq: Four times a day (QID) | INTRAMUSCULAR | Status: DC | PRN

## 2023-06-30 MED ORDER — POVIDONE-IODINE 10 % EX SWAB
2.0000 | Freq: Once | CUTANEOUS | Status: AC
  Administered 2023-06-30: 2 via TOPICAL

## 2023-06-30 MED ORDER — LIDOCAINE HCL (PF) 1 % IJ SOLN
INTRAMUSCULAR | Status: AC
  Filled 2023-06-30: qty 60

## 2023-06-30 MED ORDER — MIDAZOLAM HCL 5 MG/5ML IJ SOLN
INTRAMUSCULAR | Status: DC | PRN
  Administered 2023-06-30: 1 mg via INTRAVENOUS

## 2023-06-30 MED ORDER — SODIUM CHLORIDE 0.9 % IV SOLN
INTRAVENOUS | Status: AC
  Filled 2023-06-30: qty 2

## 2023-06-30 MED ORDER — SODIUM CHLORIDE 0.9 % IV SOLN: INTRAVENOUS | Status: DC

## 2023-06-30 MED ORDER — CEFAZOLIN SODIUM-DEXTROSE 2-4 GM/100ML-% IV SOLN
2.0000 g | INTRAVENOUS | Status: AC
  Administered 2023-06-30: 2 g via INTRAVENOUS

## 2023-06-30 MED ORDER — FENTANYL CITRATE (PF) 100 MCG/2ML IJ SOLN
INTRAMUSCULAR | Status: AC
  Filled 2023-06-30: qty 2

## 2023-06-30 MED ORDER — MIDAZOLAM HCL 2 MG/2ML IJ SOLN
INTRAMUSCULAR | Status: AC
  Filled 2023-06-30: qty 2

## 2023-06-30 MED ORDER — ACETAMINOPHEN 325 MG PO TABS: 325.0000 mg | ORAL_TABLET | ORAL | Status: DC | PRN

## 2023-06-30 SURGICAL SUPPLY — 6 items
CABLE SURGICAL S-101-97-12 (CABLE) ×2 IMPLANT
ICD COBALT XT CRT DTPA2D4 (ICD Generator) IMPLANT
PAD DEFIB RADIO PHYSIO CONN (PAD) ×2 IMPLANT
POUCH AIGIS-R ANTIBACT ICD (Mesh General) ×1 IMPLANT
POUCH AIGIS-R ANTIBACT ICD LRG (Mesh General) IMPLANT
TRAY PACEMAKER INSERTION (PACKS) ×2 IMPLANT

## 2023-06-30 NOTE — H&P (Signed)
   Electrophysiology Note:   Date:  06/30/2023 ID:  [name], DOB [birth year], MRN [id number]   Primary Cardiologist: Peter [place], MD Primary Heart Failure: None Electrophysiologist: Elspeth Sage, MD        History of Present Illness:   [name] is a 68 y.o. male with h/o LBBB, HFrEF s/p CRT-D, CAD s/p MI with RCA stent> CABG x4, HTN, HLD, GERD, anxiety, chronic pain who presents today for scheduled generator change. Device has reached RRT. Reports feeling well. No new or acute complaints.    Review of systems complete and found to be negative unless listed in HPI.    EP Information / Studies Reviewed:     EKG today: AS - BiV paced     Echo 04/01/23:  1. Left ventricular ejection fraction, by estimation, is 30 to 35%. The  left ventricle has moderately decreased function. The left ventricle  demonstrates global hypokinesis. The left ventricular internal cavity size  was severely dilated. There is mild  left ventricular hypertrophy. Left ventricular diastolic parameters are  indeterminate.   2. Right ventricular systolic function is normal. The right ventricular  size is not well visualized.   3. Left atrial size was mild to moderately dilated.   4. The mitral valve is normal in structure. Trivial mitral valve  regurgitation. No evidence of mitral stenosis.   5. The aortic valve is tricuspid. Aortic valve regurgitation is mild to  moderate. No aortic stenosis is present.   6. Aortic dilatation noted. There is mild dilatation of the ascending  aorta, measuring 41 mm.   7. The inferior vena cava is normal in size with greater than 50%  respiratory variability, suggesting right atrial pressure of 3 mmHg.     Physical Exam:    Today's Vitals   06/30/23 1046  BP: (!) 157/83  Pulse: 81  Temp: 98.2 F (36.8 C)  TempSrc: Oral  SpO2: 96%  Weight: 98.9 kg  Height: 6' 2 (1.88 m)  PainSc: 0-No pain   Body mass index is 27.99 kg/m.  General: Well developed, in no  acute distress.  Neck: No JVD.  Cardiac: Normal rate, regular rhythm.  Resp: Normal work of breathing.  Ext: No edema.  Neuro: No gross focal deficits.  Psych: Normal affect.    ASSESSMENT AND PLAN:     Chronic Systolic Dysfunction s/p [employer] CRT-D  ICD at RRT ICM s/p CABG x4  Risks, benefits, and alternatives to ICD pulse generator replacement have been discussed.  The patient understands that risks include but are not limited to bleeding, infection, pneumothorax, perforation, tamponade, vascular damage, renal failure, MI, stroke, death, inappropriate shocks, damage to his existing leads, and lead dislodgement and wishes to proceed.   Fonda Kitty, MD, [facility], [facility] Cardiac Electrophysiology

## 2023-06-30 NOTE — Discharge Instructions (Addendum)

## 2023-07-01 ENCOUNTER — Telehealth: Payer: Self-pay | Admitting: Cardiology

## 2023-07-01 NOTE — Telephone Encounter (Signed)
  Pt is requesting to speak with Dr. Shaune nurse regarding his procedure yesterday

## 2023-07-01 NOTE — Telephone Encounter (Signed)
 Follow-up after same day discharge: Implant date: 06/30/2023  MD: Ole Holts  Device: Medtronic ICD Cobalt  Location: Left Chest    Wound check visit: 07/20/2023 90 day MD follow-up: 9/25  Remote Transmission received:Pt was not available to download the app for monitoring.   Dressing/sling removed: Pt is aware to take dressing off after 3 pm.   Confirm OAC restart on: pt not on an OAC  Please continue to monitor your cardiac device site for redness, swelling, and drainage. Call the device clinic at [phone] if you experience these symptoms, fever/chills, or have questions about your device.   Remote monitoring is used to monitor your cardiac device from home. This monitoring is scheduled every 91 days by our office. It allows us  to keep an eye on the functioning of your device to ensure it is working properly.

## 2023-07-02 MED FILL — Midazolam HCl Inj 2 MG/2ML (Base Equivalent): INTRAMUSCULAR | Qty: 1 | Status: AC

## 2023-07-15 ENCOUNTER — Ambulatory Visit

## 2023-07-20 ENCOUNTER — Ambulatory Visit: Attending: Cardiology

## 2023-07-20 ENCOUNTER — Telehealth: Payer: Self-pay

## 2023-07-20 ENCOUNTER — Encounter: Payer: PPO | Admitting: Family

## 2023-07-20 DIAGNOSIS — I5022 Chronic systolic (congestive) heart failure: Secondary | ICD-10-CM

## 2023-07-20 DIAGNOSIS — I447 Left bundle-branch block, unspecified: Secondary | ICD-10-CM | POA: Diagnosis not present

## 2023-07-20 NOTE — Progress Notes (Signed)
 Normal Bi-V ICD wound check. Wound well healed. Presenting rhythm: AS/BV- 100 . Routine testing performed. Thresholds, sensing, and impedances consistent with pre-generator change measurements . No treated arrhythmias.  Pt enrolled in remote follow-up  Patient advised that he has noticed a vibration at the pocket where his device is located at for the last 3 + days.  He states he only notices this sensation when it is lightening but the sensation does make his chest feel sore and it interrupts his sleep pattern when this occurs during the night.   Patient aware that we will follow up with [employer] [facility] for further advisement and that we will call him back with any further recommendations.   [name] voices understanding and is agreeable.

## 2023-07-20 NOTE — Telephone Encounter (Signed)
 Called patient spoke to wife she stated husband has not be feeling good.Stated he complains of no energy.He recently had pacemaker generator change.He does not feel any better since generator change.No chest pain.Appointment scheduled with Dr.Jordan 8/29 at 3:20 pm.

## 2023-07-20 NOTE — Patient Instructions (Signed)

## 2023-07-22 DIAGNOSIS — M4856XA Collapsed vertebra, not elsewhere classified, lumbar region, initial encounter for fracture: Secondary | ICD-10-CM | POA: Diagnosis not present

## 2023-07-22 DIAGNOSIS — M47816 Spondylosis without myelopathy or radiculopathy, lumbar region: Secondary | ICD-10-CM | POA: Diagnosis not present

## 2023-08-02 DIAGNOSIS — M47816 Spondylosis without myelopathy or radiculopathy, lumbar region: Secondary | ICD-10-CM | POA: Diagnosis not present

## 2023-08-06 DIAGNOSIS — M7062 Trochanteric bursitis, left hip: Secondary | ICD-10-CM | POA: Diagnosis not present

## 2023-08-06 DIAGNOSIS — M25461 Effusion, right knee: Secondary | ICD-10-CM | POA: Diagnosis not present

## 2023-08-06 DIAGNOSIS — M2550 Pain in unspecified joint: Secondary | ICD-10-CM | POA: Diagnosis not present

## 2023-08-06 DIAGNOSIS — M7061 Trochanteric bursitis, right hip: Secondary | ICD-10-CM | POA: Diagnosis not present

## 2023-08-16 DIAGNOSIS — M47816 Spondylosis without myelopathy or radiculopathy, lumbar region: Secondary | ICD-10-CM | POA: Diagnosis not present

## 2023-08-23 ENCOUNTER — Other Ambulatory Visit: Payer: Self-pay | Admitting: Sports Medicine

## 2023-08-30 ENCOUNTER — Other Ambulatory Visit: Payer: Self-pay | Admitting: Cardiology

## 2023-08-30 DIAGNOSIS — R051 Acute cough: Secondary | ICD-10-CM | POA: Diagnosis not present

## 2023-08-30 DIAGNOSIS — Z951 Presence of aortocoronary bypass graft: Secondary | ICD-10-CM

## 2023-08-30 DIAGNOSIS — E785 Hyperlipidemia, unspecified: Secondary | ICD-10-CM

## 2023-09-01 NOTE — Progress Notes (Unsigned)
 Cardiology Office Note:    Date:  09/03/2023   ID:  DESIDERIO DOLATA, DOB 07-27-1955, MRN [id number]  PCP:  Emerick Avelina POUR, PA-C  Cardiologist:  Treniya Lobb [place], MD  Electrophysiologist:  Elspeth Sage, MD   Referring MD: Emerick Avelina POUR, PA-C   No chief complaint on file.    History of Present Illness:    [name] is a 68 y.o. male with a hx of CAD s/p CABG, hypertension, left bundle branch block, hyperlipidemia, and chronic systolic heart failure.  Patient was admitted in June 2019 with flash pulmonary edema.  He was ruled in for NSTEMI and underwent cardiac catheterization which showed severe three-vessel CAD. He underwent CABG by Dr Fleeta Ochoa with LIMA to LAD, SVG to diagonal, SVG to ramus intermediate, and SVG to RCA.  He had a syncope in June 2019 while waiting in the neurology office due to urethral stricture.  He was noted to have wide-complex tachycardia on telemetry with heart rate of 170.  EF was 35 to 40%.  Lower extremity Doppler was negative for DVT.  CT of the chest was negative for PE.  He was seen by EP, the underlying rhythm could be either VT versus NSVT.  He was fitted with LifeVest.  Medication was cut back due to mild hypotension and orthostasis.  Repeat echocardiogram in September 2019 showed persistently low EF of 25 to 30%.  He was unable to tolerate Entresto , spironolactone  and carvedilol  due to orthostatic symptom and the lightheadedness.  Patient eventually underwent BIV ICD implantation in March 2020 by Dr. Sage after failing another trial of medical therapy.  Since then, repeat echocardiogram in July 2020 did show significant improvement in EF to 40 to 45%.  Was seen by EP on 08/14/20.  Noted persistent chest pain. Also notes increased dyspnea. Mild edema. Was given lasix  which he has taken a couple of times. Myoview  ordered. This showed EF drop to 17% with large inferolateral scar and anteroapical ischemia. Repeat Echo is showed EF 40%.  Cardiac cath was done.  This showed all grafts patent except for SVG to ramus which was occluded.  Right heart pressures, LV filling pressures and CO were normal. Recommended addition of SGLT 2 inhibitor. He did have lumbar surgery in September 2022.   He was  seen in the office on 06/20/2021 ordered a myriad of concerns including fatigue, lightheadedness, intermittent low heart rate, dyspnea on exertion, and multiple episodes of presyncope. Additionally, he reported bilateral lower extremity edema, right lower leg pain, and intermittent cramping in his calves with activity.  He was scheduled for outpatient cardiac catheterization for further evaluation. He, however, presented to the ED on 06/23/2021 with complaints of shortness of breath.  He was hospitalized from 6/19-6/21/2023 in the setting of acute heart failure exacerbation/CAD with anginal equivalent.  Troponin was mildly elevated. Chest x-ray showed mild CHF, small left pleural effusion. He received IV Lasix .  Lower extremity duplex was negative for DVT.  CTA of the chest was negative for PE.  He underwent cardiac catheterization on 06/24/2021 which revealed stable three-vessel CAD, known occluded SVG-RI, otherwise patent grafts, progression of proximal ramus intermedius lesion  to 80% with positive FFR s/p DES, RHC with relatively normal pressures.    He is now at [facility] on his ICD. Plans to have generator change out on June 25 by Dr Kennyth. He is still riding his bike and does well with this. He still has random SOB when going up stairs. His weight has  been steady within 3 lbs. Doesn't always take his lasix .    Echocardiogram 04/01/2023 showed stable LVEF at 30-35%, intermediate diastolic parameters, and mild-moderate left atrial dilation.  He was also noted to have mild dilation of his ascending aorta measuring 41 mm.  No significant changed findings were noted.   On follow up today he is doing well from a cardiac standpoint. Has a lot of arthritic problems. Underwent nerve  ablation in his back today. Denies any chest pain, dyspnea or swelling. Rides exercise bike 45 minutes a day and goes to gym twice a week.    Past Medical History:  Diagnosis Date   AICD (automatic cardioverter/defibrillator) present    Anxiety    Arthritis    mild; back, neck, spine (06/09/2017)   CHF (congestive heart failure) (HCC)    Chronic back pain    Coronary artery disease    GERD (gastroesophageal reflux disease)    History of gout    History of kidney stones X 2   Hypercholesterolemia    Hypertension    LBBB (left bundle branch block)    MI (myocardial infarction) (HCC) 1993   LATERAL   MI (myocardial infarction) (HCC) ?09/2001; 06/07/2017   Pneumonia    several times (06/09/2017)   Pre-diabetes    S/P coronary artery stent placement    RCA   Varicose veins     Past Surgical History:  Procedure Laterality Date   ANTERIOR CERVICAL DECOMP/DISCECTOMY FUSION  03/03/2011   Procedure: ANTERIOR CERVICAL DECOMPRESSION/DISCECTOMY FUSION 3 LEVELS;  Surgeon: Catalina CHRISTELLA Stains, MD;  Location: [facility] NEURO ORS;  Service: Neurosurgery;  Laterality: N/A;  Cervical three-four Cervical four-five Cervical five-six Anterior cervical decompression/diskectomy, fusion   APPLICATION OF ROBOTIC ASSISTANCE FOR SPINAL PROCEDURE N/A 10/03/2020   Procedure: APPLICATION OF ROBOTIC ASSISTANCE FOR SPINAL PROCEDURE;  Surgeon: Lanis Pupa, MD;  Location: [facility] OR;  Service: Neurosurgery;  Laterality: N/A;   BACK SURGERY     BIV ICD GENERATOR CHANGEOUT N/A 06/30/2023   Procedure: BIV ICD GENERATOR CHANGEOUT;  Surgeon: Kennyth Chew, MD;  Location: [facility] INVASIVE CV LAB;  Service: Cardiovascular;  Laterality: N/A;   CARDIAC CATHETERIZATION  2009   Stents in RCA patent. 70 to 80% PL, and 50% ostial PD.    CARDIAC CATHETERIZATION N/A 11/20/2014   Procedure: Left Heart Cath and Coronary Angiography;  Surgeon: Keshon Markovitz M [place], MD;  Location: [facility] INVASIVE CV LAB;  Service: Cardiovascular;  Laterality: N/A;    CORONARY ANGIOPLASTY     DIRECT ANGIOPLASTY THE MARGINAL BRANCH   CORONARY ANGIOPLASTY WITH STENT PLACEMENT     RCA   CORONARY ARTERY BYPASS GRAFT N/A 06/14/2017   Procedure: CORONARY ARTERY BYPASS GRAFTING (CABG) x four, using left internal mammary artery and right leg greater saphenous vein harvested endoscopically;  Surgeon: Fleeta Hanford Coy, MD;  Location: [facility] OR;  Service: Open Heart Surgery;  Laterality: N/A;   CORONARY PRESSURE/FFR STUDY N/A 06/24/2021   Procedure: INTRAVASCULAR PRESSURE WIRE/FFR STUDY;  Surgeon: Anner Alm ORN, MD;  Location: [facility] INVASIVE CV LAB;  Service: Cardiovascular;  Laterality: N/A;   CORONARY STENT INTERVENTION N/A 06/24/2021   Procedure: CORONARY STENT INTERVENTION;  Surgeon: Anner Alm ORN, MD;  Location: [facility] INVASIVE CV LAB;  Service: Cardiovascular;  Laterality: N/A;   CYSTOSCOPY N/A 06/14/2017   Procedure: CYSTOSCOPY;  Surgeon: Fleeta Hanford Coy, MD;  Location: [facility] OR;  Service: Open Heart Surgery;  Laterality: N/A;   ICD IMPLANT N/A 03/11/2018   Procedure: ICD IMPLANT - Dual Chamber;  Surgeon: Fernande,  Elspeth BROCKS, MD;  Location: [facility] INVASIVE CV LAB;  Service: Cardiovascular;  Laterality: N/A;   INSERTION OF SUPRAPUBIC CATHETER N/A 06/14/2017   Procedure: INSERTION OF SUPRAPUBIC CATHETER - Lower abdomen;  Surgeon: Fleeta Hanford Coy, MD;  Location: [facility] OR;  Service: Open Heart Surgery;  Laterality: N/A;   POSTERIOR LUMBAR FUSION  10/2011   RIGHT/LEFT HEART CATH AND CORONARY ANGIOGRAPHY N/A 06/08/2017   Procedure: RIGHT/LEFT HEART CATH AND CORONARY ANGIOGRAPHY;  Surgeon: [place], Pawan Knechtel M, MD;  Location: [facility] INVASIVE CV LAB;  Service: Cardiovascular;  Laterality: N/A;   RIGHT/LEFT HEART CATH AND CORONARY/GRAFT ANGIOGRAPHY N/A 09/06/2020   Procedure: RIGHT/LEFT HEART CATH AND CORONARY/GRAFT ANGIOGRAPHY;  Surgeon: [place], Leanda Padmore M, MD;  Location: [facility] INVASIVE CV LAB;  Service: Cardiovascular;  Laterality: N/A;   RIGHT/LEFT HEART CATH AND CORONARY/GRAFT ANGIOGRAPHY N/A 06/24/2021    Procedure: RIGHT/LEFT HEART CATH AND CORONARY/GRAFT ANGIOGRAPHY;  Surgeon: Anner Alm ORN, MD;  Location: [facility] INVASIVE CV LAB;  Service: Cardiovascular;  Laterality: N/A;   TEE WITHOUT CARDIOVERSION N/A 06/14/2017   Procedure: TRANSESOPHAGEAL ECHOCARDIOGRAM (TEE);  Surgeon: Fleeta Hanford, Coy, MD;  Location: [facility] OR;  Service: Open Heart Surgery;  Laterality: N/A;   TOTAL KNEE ARTHROPLASTY Right 12/17/2021   Procedure: TOTAL KNEE ARTHROPLASTY;  Surgeon: Gerome Charleston, MD;  Location: [facility] ORS;  Service: Orthopedics;  Laterality: Right;  adductor canal  120   URETHROPLASTY N/A 10/15/2017   Procedure: URETHROPLASTY WITH BUCCAL GRAFT HARVAST;  Surgeon: Cam Morene ORN, MD;  Location: [facility] ORS;  Service: Urology;  Laterality: N/A;    Current Medications: Current Meds  Medication Sig   acetaminophen  (TYLENOL ) 500 MG tablet Take 1,000 mg by mouth 3 (three) times daily. Pt. Reports he is taking half (Patient taking differently: Take 1,000 mg by mouth in the morning and at bedtime. Pt. Reports he is taking half)   amoxicillin  (AMOXIL ) 500 MG capsule Take 500 mg by mouth 3 (three) times daily.   aspirin  81 MG EC tablet Take 81 mg by mouth in the morning.   carvedilol  (COREG ) 6.25 MG tablet TAKE 1 TABLET BY MOUTH 2 TIMES DAILY WITH A meal **Must have office visit FOR additional refills**   cetirizine (ZYRTEC) 10 MG tablet Take 10 mg by mouth daily as needed for allergies.   dapagliflozin propanediol (FARXIGA) 5 MG TABS tablet Take 2.5 mg by mouth daily.   Evolocumab  (REPATHA  SURECLICK) 140 MG/ML SOAJ INJECT INTO THE SKIN EVERY 14 DAYS   furosemide  (LASIX ) 40 MG tablet Take 20 mg by mouth 2 (two) times daily. OK TO TAKE EXTRA TAB FOR WEIGHT GAIN 2-3 LBS/DAY OR 5LBS/WEEKLY   Glucosamine-Chondroitin (OSTEO BI-FLEX REGULAR STRENGTH PO) Take by mouth 2 (two) times daily.   HYDROcodone  bit-homatropine (HYCODAN) 5-1.5 MG/5ML syrup Take 5 mLs by mouth every 8 (eight) hours as needed for cough.   Misc Natural  Products (DEEP SLEEP) CAPS Take 2 capsules by mouth at bedtime.   Misc Natural Products (GLUCOSAMINE CHOND MSM FORMULA PO) Take 1 tablet by mouth in the morning and at bedtime.   Multiple Vitamins-Minerals (AIRBORNE GUMMIES PO) Take 3 each by mouth 2 (two) times daily.   Multiple Vitamins-Minerals (MULTIVITAMIN WITH MINERALS) tablet Take 1 tablet by mouth daily. Mega men multivitamin pak   pantoprazole  (PROTONIX ) 40 MG tablet TAKE 1 TABLET BY MOUTH EVERY DAY   valsartan  (DIOVAN ) 40 MG tablet TAKE 1 TABLET BY MOUTH ONCE DAILY   zolpidem  (AMBIEN ) 10 MG tablet TAKE 1 TABLET BY MOUTH NIGHTLY AT BEDTIME  Allergies:   Niacin, Statins, and Zetia [ezetimibe]   Social History   Socioeconomic History   Marital status: Married    Spouse name: Not on file   Number of children: 1   Years of education: Not on file   Highest education level: Not on file  Occupational History   Occupation: Truck Airline pilot  Tobacco Use   Smoking status: Former    Current packs/day: 0.00    Average packs/day: 2.0 packs/day for 20.0 years (40.0 ttl pk-yrs)    Types: Cigarettes    Start date: 05/07/1979    Quit date: 05/07/1999    Years since quitting: 24.3   Smokeless tobacco: Never  Vaping Use   Vaping status: Never Used  Substance and Sexual Activity   Alcohol use: Not Currently    Comment: 2-3 per month (occasionally)   Drug use: Never   Sexual activity: Yes    Partners: Female    Comment: married  Other Topics Concern   Not on file  Social History Narrative   Diet      Do you drink/eat things with caffeine  No      Marital Status Married What year were you married?  1987      Do you live in a house, apartment, assisted living, condo, trailer, etc.?  House      Is it one or more stories?  2 stories      How many persons live in your home?  2         Do you have any pets in your home?(please list)  No      Highest level of education completed:  11 Years      Current or past profession:  Sales-past       Do you exercise?:  YES    Type and how often:  Cardio and weights/At least everyday      Do you have a Living Will? (Form that indicates scenarios where you would not want your life prolonged)  YES      Do you have a DNR form?  YES       If not, would you like to discuss one?      Do you have signed POA/HPOA forms?  NO      Do you have difficulty bathing or dressing yourself?  NO      Do you have difficulty preparing food or eating?  NO      Do you have difficulty managing medications?  NO      Do you have difficulty managing your finances?  NO      Do you have difficulty affording your medications?  NO                     Social Drivers of Corporate investment banker Strain: Low Risk  (01/09/2021)   Overall Financial Resource Strain (CARDIA)    Difficulty of Paying Living Expenses: Not very hard  Food Insecurity: Low Risk  (06/08/2023)   Received from [facility]   Hunger Vital Sign    Within the past 12 months, you worried that your food would run out before you got money to buy more: Patient unable to answer    Within the past 12 months, the food you bought just didn't last and you didn't have money to get more. : Never true  Transportation Needs: No Transportation Needs (06/08/2023)   Received from [facility]    In the  past 12 months, has lack of reliable transportation kept you from medical appointments, meetings, work or from getting things needed for daily living? : No  Physical Activity: Not on file  Stress: Not on file  Social Connections: Unknown (05/19/2021)   Received from [facility]   Social Network    Social Network: Not on file     Family History: The patient's family history includes Breast cancer in his mother; Coronary artery disease in his mother; Diabetes in his father; Heart attack in his brother, father, and maternal grandfather; Heart disease in his brother; Stroke in his father.  ROS:   Please see the history of  present illness.     All other systems reviewed and are negative.  EKGs/Labs/Other Studies Reviewed:    The following studies were reviewed today:  Echo 07/11/2018 1. The left ventricle has mild-moderately reduced systolic function, with an ejection fraction of 40-45%. The cavity size was normal. Left ventricular diastolic Doppler parameters are indeterminate. Indeterminate filling pressures.  2. The right ventricle has normal systolic function. The cavity was normal. There is no increase in right ventricular wall thickness. Right ventricular systolic pressure could not be assessed.  3. Left atrial size was mildly dilated.  4. Aortic valve regurgitation is mild to moderate by color flow Doppler. No stenosis of the aortic valve.  5. The Ascending aorta measures 39 mm, within normal limits when indexed to body surface area.  6. When compared to the prior study: 01/21/2018- LV ejection fraction has improved. Ventricular septal contraction is less dysynchronous. Aortic valve regurgitation has not significantly changed. Side by side comparison of images performed.  Myoview  08/21/20: Study Highlights    Nuclear stress EF: 17%. The left ventricular ejection fraction is severely decreased (<30%). There was no ST segment deviation noted during stress. Defect 1: There is a large defect of severe severity present in the basal inferior, basal inferolateral, mid inferior, mid inferolateral and apical inferior location. Defect 2: There is a small defect of mild severity present in the apical anterior location. This is a high risk study. Findings consistent with ischemia and prior myocardial infarction.   High risk study due to severely decreased left ventricular function. There is a large (old) inferolateral scar. There is a small area of mild anteroapical ischemia. There has been a marked reduction in left ventricular systolic function since 2018.  Recommend correlation with echo and consider possible  balanced ischemia. Compared to ECG from 02/05/2020, there is a marked reduction in the amplitude of the initial R wave in V1, although the initial deflection is still positive. Visually, there appears to be substantial septal-lateral dyssynchrony. Consider reevaluation of CRT device settings.    Echo 08/30/20: IMPRESSIONS     1. Poor acoustic windows limit study. Definity  used. LVEF is moderately  depressed with diffuse hypokinesis worse in the annteiror and lateral  walls. LVEF is approximately 40%. The left ventricle has moderately  decreased function. The left ventricular  internal cavity size was severely dilated. There is mild left ventricular  hypertrophy. Indeterminate diastolic filling due to E-A fusion.   2. Right ventricular systolic function is normal. The right ventricular  size is normal.   3. Mild mitral valve regurgitation.   4. Aortic valve regurgitation is mild. Mild aortic valve sclerosis is  present, with no evidence of aortic valve stenosis.   5. Aortic dilatation noted. There is mild dilatation of the aortic root,  measuring 42 mm.   Comparison(s): The left ventricular function is unchanged.  Cardiac cath 09/06/20:  RIGHT/LEFT HEART CATH AND CORONARY/GRAFT ANGIOGRAPHY   Conclusion      Ost LM to Mid LM lesion is 40% stenosed.   Prox LAD to Mid LAD lesion is 100% stenosed.   Ramus lesion is 60% stenosed.   Prox RCA lesion is 90% stenosed.   Origin lesion is 100% stenosed.   SVG graft was visualized by angiography and is normal in caliber.   SVG graft was visualized by angiography.   SVG graft was visualized by angiography and is normal in caliber.   LIMA graft was visualized by angiography and is normal in caliber.   The graft exhibits no disease.   The graft exhibits no disease.   The graft exhibits no disease.   LV end diastolic pressure is normal.   There is no aortic valve stenosis.   3 vessel CAD.  Patent LIMA to the LAD.  Patent SVG to the first  diagonal Patent SVG to the PDA Occluded SVG to the Ramus intermediate Normal LV filling pressures Normal right heart pressures Normal cardiac output   Plan: recommend continued medical management. The lesion in the ramus intermediate is moderate - in some views appears <40% and other views about 60-65%. This is certainly not critical. Good flow down the native vessel is probably the reason for graft closure. His EF by Echo is in line with prior Echo in 2020. Based on current findings I cannot explain his dyspnea. Would consider outpatient sleep study and/or PFTs to consider other causes. We will add Jardiance  for his CHF and DM. He does not need diuresis.    Echo 06/16/21: IMPRESSIONS     1. Left ventricular ejection fraction, by estimation, is 30 to 35%. Left  ventricular ejection fraction by 3D volume is 31 %. The left ventricle has  moderately decreased function. The left ventricle demonstrates global  hypokinesis. The left ventricular  internal cavity size was moderately dilated. Left ventricular diastolic  parameters are consistent with Grade I diastolic dysfunction (impaired  relaxation).   2. Right ventricular systolic function is mildly reduced. The right  ventricular size is normal. There is normal pulmonary artery systolic  pressure. The estimated right ventricular systolic pressure is 33.2 mmHg.   3. Left atrial size was mild to moderately dilated.   4. The mitral valve is grossly normal. Mild mitral valve regurgitation.  No evidence of mitral stenosis.   5. The aortic valve is tricuspid. Aortic valve regurgitation is mild. No  aortic stenosis is present.   6. There is mild dilatation of the ascending aorta, measuring 41 mm.   7. The inferior vena cava is normal in size with greater than 50%  respiratory variability, suggesting right atrial pressure of 3 mmHg.   Comparison(s): Changes from prior study are noted. The left ventricular  function is worsened.    Cardiac cath  06/24/21: Procedures  CORONARY STENT INTERVENTION  INTRAVASCULAR PRESSURE WIRE/FFR STUDY  RIGHT/LEFT HEART CATH AND CORONARY/GRAFT ANGIOGRAPHY   Conclusion      -------------- NATIVE CORONARIES------------   Prox RCA lesion is 90% stenosed.  Dist RCA lesion is 90% stenosed.   Ost LM to Mid LM lesion is 40% stenosed.   Prox LAD lesion is 100% stenosed prior to D1. Prox LAD to Mid LAD lesion is 100% stenosed following D1.   CULPRIT LESION: Ramus lesion is 75% stenosed.  (Borderline positive by RFR (0.91) and FFR 0.80) -> given the patient's symptoms, this is felt to be significant.   A  drug-eluting stent was successfully placed using a SYNERGY XD 3.0X16 -> deployed to 3.3 mm.   Post intervention, there is a 0% residual stenosis.   -------------- GRAFTS -------------------------------   LIMA-LAD graft was visualized by angiography and is normal in caliber. The graft exhibits no disease. There is no competitive flow   SVG-D1 graft was visualized by angiography and is normal in caliber. The graft exhibits no disease, and the flow is not reversed.  There is retrograde filling of a major septal perforator trunk in the LAD that it would otherwise not be perfused by the native LAD or retrograde filling from the LIMA..   SVG-rPDA graft was visualized by angiography and is moderate in size.  The graft exhibits no disease. There is trivial competitive flow   SVG-RI graft was visualized by angiography, Origin lesion is 100% stenosed.   -------------- HEMODYNAMICS-----------------   LV end diastolic pressure is normal.   There is no aortic valve stenosis.   POST-OPERATIVE DIAGNOSIS:   Stable Three-Vessel CAD: Ostial mid OM 40%.  Proximal to mid LAD 100%.  Proximal RCA 90% at bend with trace filling of the PDA and PL.  Patent LCx that is small and nondominant.   Culprit lesion: Progression of proximal Ramus Intermedius (RI) lesion from 60% to 70-80% -> RFR 0.90, 0.91 with a FFR of 0.80.  Based on his  symptoms thought to be significant. Successful DES PCI of ostial and proximal L RI using Synergy DES 3.0 mm x 16 mm deployed to 3.3 mm. Reducing lesion from 70-80% to 0% TIMI-3 flow pre and post.   Known occluded SVG-RI with otherwise patent SVG-PDA, SVG-D1, and LIMA-LAD RHC pressures relatively normal:  RAP 2 mmHg, RVP-EDP 28/0-0 mmHg; PAP-mean 19/10-15 mmHg, PCWP 16 mmHg; LVP-EDP 120/1-12 mmHg,  AoP-MAP 116/54 mmHg - 72 mmHg; Ao sat 98%, PA sat 69% Cardiac Output-Index: Fick 5.32-2.33; TD 3.21-1.4 (suggestive of valvular disease.   PLAN OF CARE:  Return to nursing floor for post PCI care after sheath removal. ->  Dual and platelet therapy for 6 months, then okay to stop aspirin .  Seems relatively euvolemic on right heart cath.     Alm Clay, MD  Right Heart  Right Heart Pressures PAP-mean 19/10-15 mmHg,  PCWP 16 mmHg;  LV EDP is normal. Minimally elevated: LVP-EDP 120/1-12 mmHg,  RHC pressures relatively normal:   AoP-MAP 116/54 mmHg - 72 mmHg;   Ao sat 98%, PA sat 69%  Cardiac Output-Index: Fick 5.32-2.33; TD 3.21-1.4 (suggestive of valvular disease.  Right Atrium Right atrial pressure is normal. RAP 2 mmHg,  Right Ventricle RVP-EDP 28/0-0 mmHg;  Coronary Diagrams  Diagnostic Dominance: Right  Intervention   Echo 04/01/23: IMPRESSIONS     1. Left ventricular ejection fraction, by estimation, is 30 to 35%. The  left ventricle has moderately decreased function. The left ventricle  demonstrates global hypokinesis. The left ventricular internal cavity size  was severely dilated. There is mild  left ventricular hypertrophy. Left ventricular diastolic parameters are  indeterminate.   2. Right ventricular systolic function is normal. The right ventricular  size is not well visualized.   3. Left atrial size was mild to moderately dilated.   4. The mitral valve is normal in structure. Trivial mitral valve  regurgitation. No evidence of mitral stenosis.   5. The aortic  valve is tricuspid. Aortic valve regurgitation is mild to  moderate. No aortic stenosis is present.   6. Aortic dilatation noted. There is mild dilatation of the ascending  aorta,  measuring 41 mm.   7. The inferior vena cava is normal in size with greater than 50%  respiratory variability, suggesting right atrial pressure of 3 mmHg.   Comparison(s): No significant change from prior study. Prior images  reviewed side by side.   EKG:  EKG is not ordered today.   Recent Labs: 06/09/2023: BUN 24; Creatinine, Ser 0.88; Hemoglobin 16.4; Platelets 207; Potassium 4.8; Sodium 141  Recent Lipid Panel    Component Value Date/Time   CHOL 154 05/18/2022 1027   TRIG 207 (H) 05/18/2022 1027   HDL 45 05/18/2022 1027   CHOLHDL 3.4 05/18/2022 1027   CHOLHDL 2.6 10/29/2017 0939   VLDL 27 10/29/2017 0939   LDLCALC 75 05/18/2022 1027   Dated 03/11/23: cholesterol 152, triglycerides 124, HDL 60, LDL 71. A1c 7.4%. CMET and CBC OK.  Physical Exam:    VS:  BP 110/62 (BP Location: Left Arm, Patient Position: Sitting, Cuff Size: Normal)   Pulse 88   Ht 6' 2 (1.88 m)   Wt 220 lb 8 oz (100 kg)   SpO2 96%   BMI 28.31 kg/m     Wt Readings from Last 3 Encounters:  09/03/23 220 lb 8 oz (100 kg)  06/30/23 218 lb (98.9 kg)  05/21/23 220 lb 9.6 oz (100.1 kg)     GEN:  Well nourished, well developed in no acute distress HEENT: Normal NECK: No JVD; No carotid bruits LYMPHATICS: No lymphadenopathy CARDIAC: RRR, no murmurs, rubs, gallops RESPIRATORY:  Clear to auscultation without rales, wheezing or rhonchi  ABDOMEN: Soft, non-tender, non-distended MUSCULOSKELETAL: tr edema; No deformity  SKIN: Warm and dry NEUROLOGIC:  Alert and oriented x 3 PSYCHIATRIC:  Normal affect   ASSESSMENT:    1. Chronic systolic heart failure (HCC)   2. Coronary artery disease of native artery of native heart with stable angina pectoris (HCC)   3. S/P CABG x 4   4. LBBB (left bundle branch block)   5. Hyperlipidemia LDL  goal <70           PLAN:    In order of problems listed above:  CAD s/p CABG 2019: Abnormal Myoview  with large inferolateral scar and anteroapical ischemia Aug 2022.  Cardiac cath showed patent grafts except SVG to ramus which was occluded. More recent admission in June 2023 with stenting of native ramus intermediate. He is without angina.   Hyperlipidemia: On Repatha . Statin intolerant. Last LDL 71  Acute on Chronic combined systolic/diastolic  heart failure: Last EF by Echo was 35-40% and is s/p BiV ICD. On Coreg  and valsartan  at low doses. Entresto  resulted in low BPs. Unable to afford Jardiance .  On low dose Farxiga. Right heart cath normal right heart and LV filling pressures. Normal cardiac output. Taking lasix  regularly.   Status post biventricular ICD: Followed by EP.   GERD  6.   Lumbar disc disease. S/p surgery  7.  S/p right TKR  8. DM A1c 7.2%. follow up with PCP.     Medication Adjustments/Labs and Tests Ordered: Current medicines are reviewed at length with the patient today.  Concerns regarding medicines are outlined above.  No orders of the defined types were placed in this encounter.   Meds ordered this encounter  Medications   nitroGLYCERIN  (NITROSTAT ) 0.4 MG SL tablet    Sig: Place 1 tablet (0.4 mg total) under the tongue every 5 (five) minutes x 3 doses as needed for chest pain.    Dispense:  25 tablet    Refill:  3     Patient Instructions  Medication Instructions:  Continue same medications *If you need a refill on your cardiac medications before your next appointment, please call your pharmacy*  Lab Work: None ordered  Testing/Procedures: None ordered  Follow-Up: At [facility], you and your health needs are our priority.  As part of our continuing mission to provide you with exceptional heart care, our providers are all part of one team.  This team includes your primary Cardiologist (physician) and Advanced Practice Providers  or APPs (Physician Assistants and Nurse Practitioners) who all work together to provide you with the care you need, when you need it.  Your next appointment:  6 months   Call in Oct to schedule Feb appointment     Provider:  Dr.Jamicheal Heard    We recommend signing up for the patient portal called MyChart.  Sign up information is provided on this After Visit Summary.  MyChart is used to connect with patients for Virtual Visits (Telemedicine).  Patients are able to view lab/test results, encounter notes, upcoming appointments, etc.  Non-urgent messages can be sent to your provider as well.   To learn more about what you can do with MyChart, go to [email or web address].        Follow up in 6 months  Signed, Ilaria Much [place], MD  09/03/2023 3:39 PM    [facility]

## 2023-09-03 ENCOUNTER — Encounter: Payer: Self-pay | Admitting: Cardiology

## 2023-09-03 ENCOUNTER — Ambulatory Visit: Attending: Cardiology | Admitting: Cardiology

## 2023-09-03 VITALS — BP 110/62 | HR 88 | Ht 74.0 in | Wt 220.5 lb

## 2023-09-03 DIAGNOSIS — I447 Left bundle-branch block, unspecified: Secondary | ICD-10-CM | POA: Diagnosis not present

## 2023-09-03 DIAGNOSIS — I5022 Chronic systolic (congestive) heart failure: Secondary | ICD-10-CM

## 2023-09-03 DIAGNOSIS — E785 Hyperlipidemia, unspecified: Secondary | ICD-10-CM

## 2023-09-03 DIAGNOSIS — I25118 Atherosclerotic heart disease of native coronary artery with other forms of angina pectoris: Secondary | ICD-10-CM | POA: Diagnosis not present

## 2023-09-03 DIAGNOSIS — M47816 Spondylosis without myelopathy or radiculopathy, lumbar region: Secondary | ICD-10-CM | POA: Diagnosis not present

## 2023-09-03 DIAGNOSIS — Z951 Presence of aortocoronary bypass graft: Secondary | ICD-10-CM | POA: Diagnosis not present

## 2023-09-03 MED ORDER — NITROGLYCERIN 0.4 MG SL SUBL
0.4000 mg | SUBLINGUAL_TABLET | SUBLINGUAL | 3 refills | Status: AC | PRN
Start: 2023-09-03 — End: ?

## 2023-09-03 NOTE — Patient Instructions (Signed)
 Medication Instructions:  Continue same medications *If you need a refill on your cardiac medications before your next appointment, please call your pharmacy*  Lab Work: None ordered  Testing/Procedures: None ordered  Follow-Up: At [facility], you and your health needs are our priority.  As part of our continuing mission to provide you with exceptional heart care, our providers are all part of one team.  This team includes your primary Cardiologist (physician) and Advanced Practice Providers or APPs (Physician Assistants and Nurse Practitioners) who all work together to provide you with the care you need, when you need it.  Your next appointment:  6 months   Call in Oct to schedule Feb appointment     Provider:  Dr.Jordan   We recommend signing up for the patient portal called MyChart.  Sign up information is provided on this After Visit Summary.  MyChart is used to connect with patients for Virtual Visits (Telemedicine).  Patients are able to view lab/test results, encounter notes, upcoming appointments, etc.  Non-urgent messages can be sent to your provider as well.   To learn more about what you can do with MyChart, go to [email or web address].

## 2023-09-09 DIAGNOSIS — M25561 Pain in right knee: Secondary | ICD-10-CM | POA: Diagnosis not present

## 2023-09-09 DIAGNOSIS — G8929 Other chronic pain: Secondary | ICD-10-CM | POA: Diagnosis not present

## 2023-09-09 DIAGNOSIS — M25461 Effusion, right knee: Secondary | ICD-10-CM | POA: Diagnosis not present

## 2023-09-09 DIAGNOSIS — Z96651 Presence of right artificial knee joint: Secondary | ICD-10-CM | POA: Diagnosis not present

## 2023-09-13 DIAGNOSIS — E119 Type 2 diabetes mellitus without complications: Secondary | ICD-10-CM | POA: Diagnosis not present

## 2023-09-13 DIAGNOSIS — M1711 Unilateral primary osteoarthritis, right knee: Secondary | ICD-10-CM | POA: Diagnosis not present

## 2023-09-13 DIAGNOSIS — M47816 Spondylosis without myelopathy or radiculopathy, lumbar region: Secondary | ICD-10-CM | POA: Diagnosis not present

## 2023-09-13 DIAGNOSIS — M7061 Trochanteric bursitis, right hip: Secondary | ICD-10-CM | POA: Diagnosis not present

## 2023-09-13 DIAGNOSIS — I5022 Chronic systolic (congestive) heart failure: Secondary | ICD-10-CM | POA: Diagnosis not present

## 2023-09-15 DIAGNOSIS — M47816 Spondylosis without myelopathy or radiculopathy, lumbar region: Secondary | ICD-10-CM | POA: Diagnosis not present

## 2023-09-21 ENCOUNTER — Other Ambulatory Visit (HOSPITAL_COMMUNITY): Payer: Self-pay | Admitting: Cardiology

## 2023-09-30 ENCOUNTER — Ambulatory Visit

## 2023-09-30 DIAGNOSIS — I5022 Chronic systolic (congestive) heart failure: Secondary | ICD-10-CM | POA: Diagnosis not present

## 2023-09-30 LAB — CUP PACEART REMOTE DEVICE CHECK
Battery Remaining Longevity: 70 mo
Battery Voltage: 3.04 V
Brady Statistic RV Percent Paced: 98.28 %
Date Time Interrogation Session: 20250925041618
HighPow Impedance: 81 Ohm
Implantable Lead Connection Status: 753985
Implantable Lead Connection Status: 753985
Implantable Lead Connection Status: 753985
Implantable Lead Implant Date: 20190610
Implantable Lead Implant Date: 20200306
Implantable Lead Implant Date: 20200306
Implantable Lead Location: 753858
Implantable Lead Location: 753859
Implantable Lead Location: 753860
Implantable Lead Model: 5071
Implantable Lead Model: 5076
Implantable Pulse Generator Implant Date: 20250625
Lead Channel Impedance Value: 3000 Ohm
Lead Channel Impedance Value: 3000 Ohm
Lead Channel Impedance Value: 323 Ohm
Lead Channel Impedance Value: 361 Ohm
Lead Channel Impedance Value: 380 Ohm
Lead Channel Impedance Value: 456 Ohm
Lead Channel Pacing Threshold Amplitude: 0.625 V
Lead Channel Pacing Threshold Amplitude: 0.625 V
Lead Channel Pacing Threshold Amplitude: 2 V
Lead Channel Pacing Threshold Pulse Width: 0.4 ms
Lead Channel Pacing Threshold Pulse Width: 0.4 ms
Lead Channel Pacing Threshold Pulse Width: 1 ms
Lead Channel Sensing Intrinsic Amplitude: 31.625 mV
Lead Channel Sensing Intrinsic Amplitude: 4.6 mV
Lead Channel Setting Pacing Amplitude: 1.5 V
Lead Channel Setting Pacing Amplitude: 2 V
Lead Channel Setting Pacing Amplitude: 2.5 V
Lead Channel Setting Pacing Pulse Width: 0.4 ms
Lead Channel Setting Pacing Pulse Width: 1 ms
Lead Channel Setting Sensing Sensitivity: 0.3 mV
Zone Setting Status: 755011
Zone Setting Status: 755011
Zone Setting Status: 755011

## 2023-10-02 ENCOUNTER — Ambulatory Visit: Payer: Self-pay | Admitting: Cardiology

## 2023-10-04 ENCOUNTER — Encounter: Payer: Self-pay | Admitting: Student

## 2023-10-04 ENCOUNTER — Ambulatory Visit: Attending: Student | Admitting: Student

## 2023-10-04 VITALS — BP 110/54 | HR 91 | Ht 73.0 in | Wt 221.0 lb

## 2023-10-04 DIAGNOSIS — I25118 Atherosclerotic heart disease of native coronary artery with other forms of angina pectoris: Secondary | ICD-10-CM | POA: Diagnosis not present

## 2023-10-04 DIAGNOSIS — I10 Essential (primary) hypertension: Secondary | ICD-10-CM | POA: Diagnosis not present

## 2023-10-04 DIAGNOSIS — Z951 Presence of aortocoronary bypass graft: Secondary | ICD-10-CM

## 2023-10-04 DIAGNOSIS — I5022 Chronic systolic (congestive) heart failure: Secondary | ICD-10-CM

## 2023-10-04 LAB — CUP PACEART INCLINIC DEVICE CHECK
Battery Remaining Longevity: 71 mo
Battery Voltage: 3.04 V
Brady Statistic AP VP Percent: 0.04 %
Brady Statistic AP VS Percent: 0.02 %
Brady Statistic AS VP Percent: 98.29 %
Brady Statistic AS VS Percent: 1.65 %
Brady Statistic RA Percent Paced: 0.07 %
Brady Statistic RV Percent Paced: 98.33 %
Date Time Interrogation Session: 20250929124549
HighPow Impedance: 73 Ohm
Implantable Lead Connection Status: 753985
Implantable Lead Connection Status: 753985
Implantable Lead Connection Status: 753985
Implantable Lead Implant Date: 20190610
Implantable Lead Implant Date: 20200306
Implantable Lead Implant Date: 20200306
Implantable Lead Location: 753858
Implantable Lead Location: 753859
Implantable Lead Location: 753860
Implantable Lead Model: 5071
Implantable Lead Model: 5076
Implantable Pulse Generator Implant Date: 20250625
Lead Channel Impedance Value: 3000 Ohm
Lead Channel Impedance Value: 3000 Ohm
Lead Channel Impedance Value: 323 Ohm
Lead Channel Impedance Value: 361 Ohm
Lead Channel Impedance Value: 399 Ohm
Lead Channel Impedance Value: 456 Ohm
Lead Channel Pacing Threshold Amplitude: 0.25 V
Lead Channel Pacing Threshold Amplitude: 0.625 V
Lead Channel Pacing Threshold Amplitude: 0.75 V
Lead Channel Pacing Threshold Amplitude: 0.875 V
Lead Channel Pacing Threshold Amplitude: 2 V
Lead Channel Pacing Threshold Amplitude: 2 V
Lead Channel Pacing Threshold Pulse Width: 0.4 ms
Lead Channel Pacing Threshold Pulse Width: 0.4 ms
Lead Channel Pacing Threshold Pulse Width: 0.4 ms
Lead Channel Pacing Threshold Pulse Width: 0.4 ms
Lead Channel Pacing Threshold Pulse Width: 1 ms
Lead Channel Pacing Threshold Pulse Width: 1 ms
Lead Channel Sensing Intrinsic Amplitude: 31.625 mV
Lead Channel Sensing Intrinsic Amplitude: 5.6 mV
Lead Channel Setting Pacing Amplitude: 1.5 V
Lead Channel Setting Pacing Amplitude: 2 V
Lead Channel Setting Pacing Amplitude: 2.5 V
Lead Channel Setting Pacing Pulse Width: 0.4 ms
Lead Channel Setting Pacing Pulse Width: 1 ms
Lead Channel Setting Sensing Sensitivity: 0.3 mV
Zone Setting Status: 755011
Zone Setting Status: 755011
Zone Setting Status: 755011

## 2023-10-04 NOTE — Patient Instructions (Signed)
 Medication Instructions:   Your physician recommends that you continue on your current medications as directed. Please refer to the Current Medication list given to you today.  *If you need a refill on your cardiac medications before your next appointment, please call your pharmacy*  Lab Work:  NONE ORDERED  TODAY   If you have labs (blood work) drawn today and your tests are completely normal, you will receive your results only by: MyChart Message (if you have MyChart) OR A paper copy in the mail If you have any lab test that is abnormal or we need to change your treatment, we will call you to review the results.  Testing/Procedures: NONE ORDERED  TODAY    Follow-Up: At [facility], you and your health needs are our priority.  As part of our continuing mission to provide you with exceptional heart care, our providers are all part of one team.  This team includes your primary Cardiologist (physician) and Advanced Practice Providers or APPs (Physician Assistants and Nurse Practitioners) who all work together to provide you with the care you need, when you need it.  Your next appointment:   1 year(s)  Provider:   You may see Dr. Kennyth  or one of the following Advanced Practice Providers on your designated Care Team:    Ozell Heck [place], [place]  We recommend signing up for the patient portal called MyChart.  Sign up information is provided on this After Visit Summary.  MyChart is used to connect with patients for Virtual Visits (Telemedicine).  Patients are able to view lab/test results, encounter notes, upcoming appointments, etc.  Non-urgent messages can be sent to your provider as well.   To learn more about what you can do with MyChart, go to [email or web address].   Other Instructions

## 2023-10-04 NOTE — Progress Notes (Signed)
  Electrophysiology Office Note:   ID:  [name], [name] 07/05/1955, MRN [id number]  Primary Cardiologist: Peter [place], MD Electrophysiologist: Fonda Kitty, MD      History of Present Illness:   [name] is a 68 y.o. male with h/o  LBBB, HFrEF s/p CRT-D, CAD s/p MI with RCA stent> CABG x4, HTN, HLD, GERD, anxiety, and chronic pain seen today for routine electrophysiology follow-up s/p generator change.  Since last being seen in our clinic the patient reports doing well overall. He has had some fatigue related to back and hip OA and neuropathy. Otherwise, he denies chest pain, palpitations, dyspnea, PND, orthopnea, nausea, vomiting, dizziness, syncope, edema, weight gain, or early satiety.    Review of systems complete and found to be negative unless listed in HPI.   EP Information / Studies Reviewed:    EKG is not ordered today. EKG from 06/30/2023 reviewed which showed AS-VP at 82 bpm       ICD Interrogation-  reviewed in detail today,  See PACEART report.  Arrhythmia/Device History Medtronic BiV ICD implanted 03/11/2018 for HFrEF, gen change 06/2023   Physical Exam:   VS:  BP (!) 110/54   Pulse 91   Ht 6' 1 (1.854 m)   Wt 221 lb (100.2 kg)   SpO2 97%   BMI 29.16 kg/m    Wt Readings from Last 3 Encounters:  10/04/23 221 lb (100.2 kg)  09/03/23 220 lb 8 oz (100 kg)  06/30/23 218 lb (98.9 kg)     GEN: No acute distress  NECK: No JVD; No carotid bruits CARDIAC: Regular rate and rhythm, no murmurs, rubs, gallops RESPIRATORY:  Clear to auscultation without rales, wheezing or rhonchi  ABDOMEN: Soft, non-tender, non-distended EXTREMITIES:  No edema; No deformity   ASSESSMENT AND PLAN:    Chronic systolic CHF  s/p Medtronic CRT-D  euvolemic today Stable on an appropriate medical regimen Normal ICD function See Pace Art report No changes today  CAD No s/s of ischemia.      HTN Stable on current regimen   Disposition:   Follow up with EP Team in 12  months   Signed, Ozell Prentice Passey, PA-C

## 2023-10-04 NOTE — Progress Notes (Signed)
 Remote ICD Transmission

## 2023-10-07 ENCOUNTER — Ambulatory Visit: Payer: Self-pay | Admitting: Cardiology

## 2023-10-19 ENCOUNTER — Other Ambulatory Visit: Payer: Self-pay | Admitting: General Practice

## 2023-10-21 DIAGNOSIS — M533 Sacrococcygeal disorders, not elsewhere classified: Secondary | ICD-10-CM | POA: Diagnosis not present

## 2023-10-21 DIAGNOSIS — M47816 Spondylosis without myelopathy or radiculopathy, lumbar region: Secondary | ICD-10-CM | POA: Diagnosis not present

## 2023-11-10 DIAGNOSIS — Z79631 Long term (current) use of antimetabolite agent: Secondary | ICD-10-CM | POA: Diagnosis not present

## 2023-11-10 DIAGNOSIS — Z79899 Other long term (current) drug therapy: Secondary | ICD-10-CM | POA: Diagnosis not present

## 2023-11-10 DIAGNOSIS — M0579 Rheumatoid arthritis with rheumatoid factor of multiple sites without organ or systems involvement: Secondary | ICD-10-CM | POA: Diagnosis not present

## 2023-11-11 DIAGNOSIS — M533 Sacrococcygeal disorders, not elsewhere classified: Secondary | ICD-10-CM | POA: Diagnosis not present

## 2023-11-18 NOTE — Progress Notes (Signed)
 Cardiology Office Note:    Date:  11/22/2023   ID:  [name], DOB 1956/01/02, MRN [id number]  PCP:  Emerick Avelina POUR, PA-C  Cardiologist:  Tyneshia Stivers, MD  Electrophysiologist:  Fonda Kitty, MD   Referring MD: Emerick Avelina POUR, PA-C   Chief Complaint  Patient presents with   Congestive Heart Failure   Coronary Artery Disease     History of Present Illness:    [name] is a 68 y.o. male with a hx of CAD s/p CABG, hypertension, left bundle branch block, hyperlipidemia, and chronic systolic heart failure.  Patient was admitted in June 2019 with flash pulmonary edema.  He was ruled in for NSTEMI and underwent cardiac catheterization which showed severe three-vessel CAD. He underwent CABG by Dr Fleeta Ochoa with LIMA to LAD, SVG to diagonal, SVG to ramus intermediate, and SVG to RCA.  He had a syncope in June 2019 while waiting in the neurology office due to urethral stricture.  He was noted to have wide-complex tachycardia on telemetry with heart rate of 170.  EF was 35 to 40%.  Lower extremity Doppler was negative for DVT.  CT of the chest was negative for PE.  He was seen by EP, the underlying rhythm could be either VT versus NSVT.  He was fitted with LifeVest.  Medication was cut back due to mild hypotension and orthostasis.  Repeat echocardiogram in September 2019 showed persistently low EF of 25 to 30%.  He was unable to tolerate Entresto , spironolactone  and carvedilol  due to orthostatic symptom and the lightheadedness.  Patient eventually underwent BIV ICD implantation in March 2020 by Dr. Fernande after failing another trial of medical therapy.  Since then, repeat echocardiogram in July 2020 did show significant improvement in EF to 40 to 45%.  Was seen by EP on 08/14/20.  Noted persistent chest pain. Also notes increased dyspnea. Mild edema. Was given lasix  which he has taken a couple of times. Myoview  ordered. This showed EF drop to 17% with large inferolateral scar and  anteroapical ischemia. Repeat Echo is showed EF 40%.  Cardiac cath was done. This showed all grafts patent except for SVG to ramus which was occluded.  Right heart pressures, LV filling pressures and CO were normal. Recommended addition of SGLT 2 inhibitor. He did have lumbar surgery in September 2022.   He was  seen in the office on 06/20/2021 ordered a myriad of concerns including fatigue, lightheadedness, intermittent low heart rate, dyspnea on exertion, and multiple episodes of presyncope. Additionally, he reported bilateral lower extremity edema, right lower leg pain, and intermittent cramping in his calves with activity.  He was scheduled for outpatient cardiac catheterization for further evaluation. He, however, presented to the ED on 06/23/2021 with complaints of shortness of breath.  He was hospitalized from 6/19-6/21/2023 in the setting of acute heart failure exacerbation/CAD with anginal equivalent.  Troponin was mildly elevated. Chest x-ray showed mild CHF, small left pleural effusion. He received IV Lasix .  Lower extremity duplex was negative for DVT.  CTA of the chest was negative for PE.  He underwent cardiac catheterization on 06/24/2021 which revealed stable three-vessel CAD, known occluded SVG-RI, otherwise patent grafts, progression of proximal ramus intermedius lesion  to 80% with positive FFR s/p DES, RHC with relatively normal pressures.    He is now at [facility] on his ICD. Plans to have generator change out on June 25 by Dr Kitty. He is still riding his bike and does well with this.  He still has random SOB when going up stairs. His weight has been steady within 3 lbs. Doesn't always take his lasix .    Echocardiogram 04/01/2023 showed stable LVEF at 30-35%, intermediate diastolic parameters, and mild-moderate left atrial dilation.  He was also noted to have mild dilation of his ascending aorta measuring 41 mm.  No significant changed findings were noted.   On follow up today he is doing well  from a cardiac standpoint. Is now on a steroid taper by Rheumatology. Has gained some weight with this. No increase in SOB, edema or chest pain. Feels well overall.    Past Medical History:  Diagnosis Date   AICD (automatic cardioverter/defibrillator) present    Anxiety    Arthritis    mild; back, neck, spine (06/09/2017)   CHF (congestive heart failure) (HCC)    Chronic back pain    Coronary artery disease    GERD (gastroesophageal reflux disease)    History of gout    History of kidney stones X 2   Hypercholesterolemia    Hypertension    LBBB (left bundle branch block)    MI (myocardial infarction) (HCC) 1993   LATERAL   MI (myocardial infarction) (HCC) ?09/2001; 06/07/2017   Pneumonia    several times (06/09/2017)   Pre-diabetes    S/P coronary artery stent placement    RCA   Varicose veins     Past Surgical History:  Procedure Laterality Date   ANTERIOR CERVICAL DECOMP/DISCECTOMY FUSION  03/03/2011   Procedure: ANTERIOR CERVICAL DECOMPRESSION/DISCECTOMY FUSION 3 LEVELS;  Surgeon: Catalina CHRISTELLA Stains, MD;  Location: [facility] NEURO ORS;  Service: Neurosurgery;  Laterality: N/A;  Cervical three-four Cervical four-five Cervical five-six Anterior cervical decompression/diskectomy, fusion   APPLICATION OF ROBOTIC ASSISTANCE FOR SPINAL PROCEDURE N/A 10/03/2020   Procedure: APPLICATION OF ROBOTIC ASSISTANCE FOR SPINAL PROCEDURE;  Surgeon: Lanis Pupa, MD;  Location: [facility] OR;  Service: Neurosurgery;  Laterality: N/A;   BACK SURGERY     BIV ICD GENERATOR CHANGEOUT N/A 06/30/2023   Procedure: BIV ICD GENERATOR CHANGEOUT;  Surgeon: Kennyth Chew, MD;  Location: [facility] INVASIVE CV LAB;  Service: Cardiovascular;  Laterality: N/A;   CARDIAC CATHETERIZATION  2009   Stents in RCA patent. 70 to 80% PL, and 50% ostial PD.    CARDIAC CATHETERIZATION N/A 11/20/2014   Procedure: Left Heart Cath and Coronary Angiography;  Surgeon: Bawi Lakins M Jemina Scahill, MD;  Location: [facility] INVASIVE CV LAB;  Service:  Cardiovascular;  Laterality: N/A;   CORONARY ANGIOPLASTY     DIRECT ANGIOPLASTY THE MARGINAL BRANCH   CORONARY ANGIOPLASTY WITH STENT PLACEMENT     RCA   CORONARY ARTERY BYPASS GRAFT N/A 06/14/2017   Procedure: CORONARY ARTERY BYPASS GRAFTING (CABG) x four, using left internal mammary artery and right leg greater saphenous vein harvested endoscopically;  Surgeon: Fleeta Hanford Coy, MD;  Location: [facility] OR;  Service: Open Heart Surgery;  Laterality: N/A;   CORONARY PRESSURE/FFR STUDY N/A 06/24/2021   Procedure: INTRAVASCULAR PRESSURE WIRE/FFR STUDY;  Surgeon: Anner Alm ORN, MD;  Location: [facility] INVASIVE CV LAB;  Service: Cardiovascular;  Laterality: N/A;   CORONARY STENT INTERVENTION N/A 06/24/2021   Procedure: CORONARY STENT INTERVENTION;  Surgeon: Anner Alm ORN, MD;  Location: [facility] INVASIVE CV LAB;  Service: Cardiovascular;  Laterality: N/A;   CYSTOSCOPY N/A 06/14/2017   Procedure: CYSTOSCOPY;  Surgeon: Fleeta Hanford Coy, MD;  Location: [facility] OR;  Service: Open Heart Surgery;  Laterality: N/A;   ICD IMPLANT N/A 03/11/2018   Procedure: ICD IMPLANT - Dual Chamber;  Surgeon: Fernande Elspeth BROCKS, MD;  Location: [facility] INVASIVE CV LAB;  Service: Cardiovascular;  Laterality: N/A;   INSERTION OF SUPRAPUBIC CATHETER N/A 06/14/2017   Procedure: INSERTION OF SUPRAPUBIC CATHETER - Lower abdomen;  Surgeon: Fleeta Hanford Coy, MD;  Location: [facility] OR;  Service: Open Heart Surgery;  Laterality: N/A;   POSTERIOR LUMBAR FUSION  10/2011   RIGHT/LEFT HEART CATH AND CORONARY ANGIOGRAPHY N/A 06/08/2017   Procedure: RIGHT/LEFT HEART CATH AND CORONARY ANGIOGRAPHY;  Surgeon: Nicha Hemann M, MD;  Location: [facility] INVASIVE CV LAB;  Service: Cardiovascular;  Laterality: N/A;   RIGHT/LEFT HEART CATH AND CORONARY/GRAFT ANGIOGRAPHY N/A 09/06/2020   Procedure: RIGHT/LEFT HEART CATH AND CORONARY/GRAFT ANGIOGRAPHY;  Surgeon: Jahir Halt M, MD;  Location: [facility] INVASIVE CV LAB;  Service: Cardiovascular;  Laterality: N/A;   RIGHT/LEFT HEART CATH AND  CORONARY/GRAFT ANGIOGRAPHY N/A 06/24/2021   Procedure: RIGHT/LEFT HEART CATH AND CORONARY/GRAFT ANGIOGRAPHY;  Surgeon: Anner Alm ORN, MD;  Location: [facility] INVASIVE CV LAB;  Service: Cardiovascular;  Laterality: N/A;   TEE WITHOUT CARDIOVERSION N/A 06/14/2017   Procedure: TRANSESOPHAGEAL ECHOCARDIOGRAM (TEE);  Surgeon: Fleeta Hanford, Coy, MD;  Location: [facility] OR;  Service: Open Heart Surgery;  Laterality: N/A;   TOTAL KNEE ARTHROPLASTY Right 12/17/2021   Procedure: TOTAL KNEE ARTHROPLASTY;  Surgeon: Gerome Charleston, MD;  Location: [facility] ORS;  Service: Orthopedics;  Laterality: Right;  adductor canal  120   URETHROPLASTY N/A 10/15/2017   Procedure: URETHROPLASTY WITH BUCCAL GRAFT HARVAST;  Surgeon: Cam Morene ORN, MD;  Location: [facility] ORS;  Service: Urology;  Laterality: N/A;    Current Medications: Current Meds  Medication Sig   acetaminophen  (TYLENOL ) 500 MG tablet Take 1,000 mg by mouth 3 (three) times daily. Pt. Reports he is taking half (Patient taking differently: Take 1,000 mg by mouth every 6 (six) hours as needed for mild pain (pain score 1-3) or moderate pain (pain score 4-6). Pt. Reports he is taking half)   aspirin  81 MG EC tablet Take 81 mg by mouth in the morning.   carvedilol  (COREG ) 6.25 MG tablet TAKE 1 TABLET BY MOUTH 2 TIMES DAILY WITH A meal ** must have office visit FOR addtional refills**   cetirizine (ZYRTEC) 10 MG tablet Take 10 mg by mouth daily as needed for allergies.   dapagliflozin propanediol (FARXIGA) 5 MG TABS tablet Take 2.5 mg by mouth daily.   DULoxetine  (CYMBALTA ) 20 MG capsule Take 40 mg by mouth daily.   Evolocumab  (REPATHA  SURECLICK) 140 MG/ML SOAJ INJECT INTO THE SKIN EVERY 14 DAYS   folic acid (FOLVITE) 1 MG tablet Take 1 mg by mouth daily.   furosemide  (LASIX ) 40 MG tablet Take 20 mg by mouth 2 (two) times daily. OK TO TAKE EXTRA TAB FOR WEIGHT GAIN 2-3 LBS/DAY OR 5LBS/WEEKLY   Glucosamine-Chondroitin (OSTEO BI-FLEX REGULAR STRENGTH PO) Take by mouth 2 (two)  times daily.   Misc Natural Products (DEEP SLEEP) CAPS Take 2 capsules by mouth at bedtime.   Misc Natural Products (GLUCOSAMINE CHOND MSM FORMULA PO) Take 1 tablet by mouth in the morning and at bedtime.   Multiple Vitamins-Minerals (AIRBORNE GUMMIES PO) Take 3 each by mouth 2 (two) times daily.   Multiple Vitamins-Minerals (MULTIVITAMIN WITH MINERALS) tablet Take 1 tablet by mouth daily. Mega men multivitamin pak   nitroGLYCERIN  (NITROSTAT ) 0.4 MG SL tablet Place 1 tablet (0.4 mg total) under the tongue every 5 (five) minutes x 3 doses as needed for chest pain.   ondansetron  (ZOFRAN -ODT) 8 MG disintegrating tablet Take 8 mg by mouth  every 8 (eight) hours as needed for nausea.   pantoprazole  (PROTONIX ) 40 MG tablet TAKE 1 TABLET BY MOUTH EVERY DAY   predniSONE  (DELTASONE ) 5 MG tablet Take 5 mg by mouth daily with breakfast.   valsartan  (DIOVAN ) 40 MG tablet TAKE 1 TABLET BY MOUTH ONCE DAILY   zolpidem  (AMBIEN ) 10 MG tablet TAKE 1 TABLET BY MOUTH NIGHTLY AT BEDTIME     Allergies:   Niacin, Statins, and Zetia [ezetimibe]   Social History   Socioeconomic History   Marital status: Married    Spouse name: Not on file   Number of children: 1   Years of education: Not on file   Highest education level: Not on file  Occupational History   Occupation: Truck airline pilot  Tobacco Use   Smoking status: Former    Current packs/day: 0.00    Average packs/day: 2.0 packs/day for 20.0 years (40.0 ttl pk-yrs)    Types: Cigarettes    Start date: 05/07/1979    Quit date: 05/07/1999    Years since quitting: 24.5   Smokeless tobacco: Never  Vaping Use   Vaping status: Never Used  Substance and Sexual Activity   Alcohol use: Not Currently    Comment: 2-3 per month (occasionally)   Drug use: Never   Sexual activity: Yes    Partners: Female    Comment: married  Other Topics Concern   Not on file  Social History Narrative   Diet      Do you drink/eat things with caffeine  No      Marital  Status Married What year were you married?  1987      Do you live in a house, apartment, assisted living, condo, trailer, etc.?  House      Is it one or more stories?  2 stories      How many persons live in your home?  2         Do you have any pets in your home?(please list)  No      Highest level of education completed:  11 Years      Current or past profession:  Sales-past      Do you exercise?:  YES    Type and how often:  Cardio and weights/At least everyday      Do you have a Living Will? (Form that indicates scenarios where you would not want your life prolonged)  YES      Do you have a DNR form?  YES       If not, would you like to discuss one?      Do you have signed POA/HPOA forms?  NO      Do you have difficulty bathing or dressing yourself?  NO      Do you have difficulty preparing food or eating?  NO      Do you have difficulty managing medications?  NO      Do you have difficulty managing your finances?  NO      Do you have difficulty affording your medications?  NO                     Social Drivers of Corporate Investment Banker Strain: Low Risk  (01/09/2021)   Overall Financial Resource Strain (CARDIA)    Difficulty of Paying Living Expenses: Not very hard  Food Insecurity: Low Risk  (06/08/2023)   Received from [facility]   Hunger Vital Sign    Within  the past 12 months, you worried that your food would run out before you got money to buy more: Patient unable to answer    Within the past 12 months, the food you bought just didn't last and you didn't have money to get more. : Never true  Transportation Needs: No Transportation Needs (06/08/2023)   Received from [facility]    In the past 12 months, has lack of reliable transportation kept you from medical appointments, meetings, work or from getting things needed for daily living? : No  Physical Activity: Not on file  Stress: Not on file  Social Connections: Unknown (05/19/2021)    Received from [facility]   Social Network    Social Network: Not on file     Family History: The patient's family history includes Breast cancer in his mother; Coronary artery disease in his mother; Diabetes in his father; Heart attack in his brother, father, and maternal grandfather; Heart disease in his brother; Stroke in his father.  ROS:   Please see the history of present illness.     All other systems reviewed and are negative.  EKGs/Labs/Other Studies Reviewed:    The following studies were reviewed today:  Echo 07/11/2018 1. The left ventricle has mild-moderately reduced systolic function, with an ejection fraction of 40-45%. The cavity size was normal. Left ventricular diastolic Doppler parameters are indeterminate. Indeterminate filling pressures.  2. The right ventricle has normal systolic function. The cavity was normal. There is no increase in right ventricular wall thickness. Right ventricular systolic pressure could not be assessed.  3. Left atrial size was mildly dilated.  4. Aortic valve regurgitation is mild to moderate by color flow Doppler. No stenosis of the aortic valve.  5. The Ascending aorta measures 39 mm, within normal limits when indexed to body surface area.  6. When compared to the prior study: 01/21/2018- LV ejection fraction has improved. Ventricular septal contraction is less dysynchronous. Aortic valve regurgitation has not significantly changed. Side by side comparison of images performed.  Myoview  08/21/20: Study Highlights    Nuclear stress EF: 17%. The left ventricular ejection fraction is severely decreased (<30%). There was no ST segment deviation noted during stress. Defect 1: There is a large defect of severe severity present in the basal inferior, basal inferolateral, mid inferior, mid inferolateral and apical inferior location. Defect 2: There is a small defect of mild severity present in the apical anterior location. This is a high risk  study. Findings consistent with ischemia and prior myocardial infarction.   High risk study due to severely decreased left ventricular function. There is a large (old) inferolateral scar. There is a small area of mild anteroapical ischemia. There has been a marked reduction in left ventricular systolic function since 2018.  Recommend correlation with echo and consider possible balanced ischemia. Compared to ECG from 02/05/2020, there is a marked reduction in the amplitude of the initial R wave in V1, although the initial deflection is still positive. Visually, there appears to be substantial septal-lateral dyssynchrony. Consider reevaluation of CRT device settings.    Echo 08/30/20: IMPRESSIONS     1. Poor acoustic windows limit study. Definity  used. LVEF is moderately  depressed with diffuse hypokinesis worse in the annteiror and lateral  walls. LVEF is approximately 40%. The left ventricle has moderately  decreased function. The left ventricular  internal cavity size was severely dilated. There is mild left ventricular  hypertrophy. Indeterminate diastolic filling due to E-A fusion.  2. Right ventricular systolic function is normal. The right ventricular  size is normal.   3. Mild mitral valve regurgitation.   4. Aortic valve regurgitation is mild. Mild aortic valve sclerosis is  present, with no evidence of aortic valve stenosis.   5. Aortic dilatation noted. There is mild dilatation of the aortic root,  measuring 42 mm.   Comparison(s): The left ventricular function is unchanged.   Cardiac cath 09/06/20:  RIGHT/LEFT HEART CATH AND CORONARY/GRAFT ANGIOGRAPHY   Conclusion      Ost LM to Mid LM lesion is 40% stenosed.   Prox LAD to Mid LAD lesion is 100% stenosed.   Ramus lesion is 60% stenosed.   Prox RCA lesion is 90% stenosed.   Origin lesion is 100% stenosed.   SVG graft was visualized by angiography and is normal in caliber.   SVG graft was visualized by angiography.    SVG graft was visualized by angiography and is normal in caliber.   LIMA graft was visualized by angiography and is normal in caliber.   The graft exhibits no disease.   The graft exhibits no disease.   The graft exhibits no disease.   LV end diastolic pressure is normal.   There is no aortic valve stenosis.   3 vessel CAD.  Patent LIMA to the LAD.  Patent SVG to the first diagonal Patent SVG to the PDA Occluded SVG to the Ramus intermediate Normal LV filling pressures Normal right heart pressures Normal cardiac output   Plan: recommend continued medical management. The lesion in the ramus intermediate is moderate - in some views appears <40% and other views about 60-65%. This is certainly not critical. Good flow down the native vessel is probably the reason for graft closure. His EF by Echo is in line with prior Echo in 2020. Based on current findings I cannot explain his dyspnea. Would consider outpatient sleep study and/or PFTs to consider other causes. We will add Jardiance  for his CHF and DM. He does not need diuresis.    Echo 06/16/21: IMPRESSIONS     1. Left ventricular ejection fraction, by estimation, is 30 to 35%. Left  ventricular ejection fraction by 3D volume is 31 %. The left ventricle has  moderately decreased function. The left ventricle demonstrates global  hypokinesis. The left ventricular  internal cavity size was moderately dilated. Left ventricular diastolic  parameters are consistent with Grade I diastolic dysfunction (impaired  relaxation).   2. Right ventricular systolic function is mildly reduced. The right  ventricular size is normal. There is normal pulmonary artery systolic  pressure. The estimated right ventricular systolic pressure is 33.2 mmHg.   3. Left atrial size was mild to moderately dilated.   4. The mitral valve is grossly normal. Mild mitral valve regurgitation.  No evidence of mitral stenosis.   5. The aortic valve is tricuspid. Aortic valve  regurgitation is mild. No  aortic stenosis is present.   6. There is mild dilatation of the ascending aorta, measuring 41 mm.   7. The inferior vena cava is normal in size with greater than 50%  respiratory variability, suggesting right atrial pressure of 3 mmHg.   Comparison(s): Changes from prior study are noted. The left ventricular  function is worsened.    Cardiac cath 06/24/21: Procedures  CORONARY STENT INTERVENTION  INTRAVASCULAR PRESSURE WIRE/FFR STUDY  RIGHT/LEFT HEART CATH AND CORONARY/GRAFT ANGIOGRAPHY   Conclusion      -------------- NATIVE CORONARIES------------   Prox RCA lesion is 90% stenosed.  Dist RCA lesion is 90% stenosed.   Ost LM to Mid LM lesion is 40% stenosed.   Prox LAD lesion is 100% stenosed prior to D1. Prox LAD to Mid LAD lesion is 100% stenosed following D1.   CULPRIT LESION: Ramus lesion is 75% stenosed.  (Borderline positive by RFR (0.91) and FFR 0.80) -> given the patient's symptoms, this is felt to be significant.   A drug-eluting stent was successfully placed using a SYNERGY XD 3.0X16 -> deployed to 3.3 mm.   Post intervention, there is a 0% residual stenosis.   -------------- GRAFTS -------------------------------   LIMA-LAD graft was visualized by angiography and is normal in caliber. The graft exhibits no disease. There is no competitive flow   SVG-D1 graft was visualized by angiography and is normal in caliber. The graft exhibits no disease, and the flow is not reversed.  There is retrograde filling of a major septal perforator trunk in the LAD that it would otherwise not be perfused by the native LAD or retrograde filling from the LIMA..   SVG-rPDA graft was visualized by angiography and is moderate in size.  The graft exhibits no disease. There is trivial competitive flow   SVG-RI graft was visualized by angiography, Origin lesion is 100% stenosed.   -------------- HEMODYNAMICS-----------------   LV end diastolic pressure is normal.   There  is no aortic valve stenosis.   POST-OPERATIVE DIAGNOSIS:   Stable Three-Vessel CAD: Ostial mid OM 40%.  Proximal to mid LAD 100%.  Proximal RCA 90% at bend with trace filling of the PDA and PL.  Patent LCx that is small and nondominant.   Culprit lesion: Progression of proximal Ramus Intermedius (RI) lesion from 60% to 70-80% -> RFR 0.90, 0.91 with a FFR of 0.80.  Based on his symptoms thought to be significant. Successful DES PCI of ostial and proximal L RI using Synergy DES 3.0 mm x 16 mm deployed to 3.3 mm. Reducing lesion from 70-80% to 0% TIMI-3 flow pre and post.   Known occluded SVG-RI with otherwise patent SVG-PDA, SVG-D1, and LIMA-LAD RHC pressures relatively normal:  RAP 2 mmHg, RVP-EDP 28/0-0 mmHg; PAP-mean 19/10-15 mmHg, PCWP 16 mmHg; LVP-EDP 120/1-12 mmHg,  AoP-MAP 116/54 mmHg - 72 mmHg; Ao sat 98%, PA sat 69% Cardiac Output-Index: Fick 5.32-2.33; TD 3.21-1.4 (suggestive of valvular disease.   PLAN OF CARE:  Return to nursing floor for post PCI care after sheath removal. ->  Dual and platelet therapy for 6 months, then okay to stop aspirin .  Seems relatively euvolemic on right heart cath.     Alm Clay, MD  Right Heart  Right Heart Pressures PAP-mean 19/10-15 mmHg,  PCWP 16 mmHg;  LV EDP is normal. Minimally elevated: LVP-EDP 120/1-12 mmHg,  RHC pressures relatively normal:   AoP-MAP 116/54 mmHg - 72 mmHg;   Ao sat 98%, PA sat 69%  Cardiac Output-Index: Fick 5.32-2.33; TD 3.21-1.4 (suggestive of valvular disease.  Right Atrium Right atrial pressure is normal. RAP 2 mmHg,  Right Ventricle RVP-EDP 28/0-0 mmHg;  Coronary Diagrams  Diagnostic Dominance: Right  Intervention   Echo 04/01/23: IMPRESSIONS     1. Left ventricular ejection fraction, by estimation, is 30 to 35%. The  left ventricle has moderately decreased function. The left ventricle  demonstrates global hypokinesis. The left ventricular internal cavity size  was severely dilated. There is mild   left ventricular hypertrophy. Left ventricular diastolic parameters are  indeterminate.   2. Right ventricular systolic function is normal. The right ventricular  size  is not well visualized.   3. Left atrial size was mild to moderately dilated.   4. The mitral valve is normal in structure. Trivial mitral valve  regurgitation. No evidence of mitral stenosis.   5. The aortic valve is tricuspid. Aortic valve regurgitation is mild to  moderate. No aortic stenosis is present.   6. Aortic dilatation noted. There is mild dilatation of the ascending  aorta, measuring 41 mm.   7. The inferior vena cava is normal in size with greater than 50%  respiratory variability, suggesting right atrial pressure of 3 mmHg.   Comparison(s): No significant change from prior study. Prior images  reviewed side by side.   EKG:  EKG is not ordered today.   Recent Labs: 06/09/2023: BUN 24; Creatinine, Ser 0.88; Hemoglobin 16.4; Platelets 207; Potassium 4.8; Sodium 141  Recent Lipid Panel    Component Value Date/Time   CHOL 154 05/18/2022 1027   TRIG 207 (H) 05/18/2022 1027   HDL 45 05/18/2022 1027   CHOLHDL 3.4 05/18/2022 1027   CHOLHDL 2.6 10/29/2017 0939   VLDL 27 10/29/2017 0939   LDLCALC 75 05/18/2022 1027   Dated 03/11/23: cholesterol 152, triglycerides 124, HDL 60, LDL 71. A1c 7.4%. CMET and CBC OK.  Physical Exam:    VS:  BP (!) 90/52   Pulse 88   Ht 6' 2 (1.88 m)   Wt 225 lb 3.2 oz (102.2 kg)   SpO2 91%   BMI 28.91 kg/m     Wt Readings from Last 3 Encounters:  11/22/23 225 lb 3.2 oz (102.2 kg)  10/04/23 221 lb (100.2 kg)  09/03/23 220 lb 8 oz (100 kg)     GEN:  Well nourished, well developed in no acute distress HEENT: Normal NECK: No JVD; No carotid bruits LYMPHATICS: No lymphadenopathy CARDIAC: RRR, no murmurs, rubs, gallops RESPIRATORY:  Clear to auscultation without rales, wheezing or rhonchi  ABDOMEN: Soft, non-tender, non-distended MUSCULOSKELETAL: tr edema; No deformity   SKIN: Warm and dry NEUROLOGIC:  Alert and oriented x 3 PSYCHIATRIC:  Normal affect   ASSESSMENT:    1. Chronic systolic heart failure (HCC)   2. Coronary artery disease of native artery of native heart with stable angina pectoris   3. LBBB (left bundle branch block)   4. S/P CABG x 4   5. Hyperlipidemia LDL goal <70            PLAN:    In order of problems listed above:  CAD s/p CABG 2019: Abnormal Myoview  with large inferolateral scar and anteroapical ischemia Aug 2022.  Cardiac cath showed patent grafts except SVG to ramus which was occluded. More recent admission in June 2023 with stenting of native ramus intermediate. He is without angina.   Hyperlipidemia: On Repatha . Statin intolerant. Last LDL 71  Acute on Chronic combined systolic/diastolic  heart failure: Last EF by Echo was 35-40% and is s/p BiV ICD. On Coreg  and valsartan  at low doses. Entresto  resulted in low BPs. Unable to afford Jardiance .  On low dose Farxiga. Right heart cath normal right heart and LV filling pressures. Normal cardiac output. Taking lasix  regularly. BP soft but asymptomatic.   Status post biventricular ICD: Followed by EP. Pacing 98% of the time  GERD  6.   Lumbar disc disease. S/p surgery  7.  S/p right TKR  8.  DM A1c 7.4%. follow up with PCP.     Medication Adjustments/Labs and Tests Ordered: Current medicines are reviewed at length with the patient today.  Concerns regarding medicines are outlined above.  No orders of the defined types were placed in this encounter.   No orders of the defined types were placed in this encounter.    There are no Patient Instructions on file for this visit. Follow up in 6 months  Signed, Royal Vandevoort, MD  11/22/2023 11:06 AM    [facility]

## 2023-11-22 ENCOUNTER — Encounter: Payer: Self-pay | Admitting: Cardiology

## 2023-11-22 ENCOUNTER — Ambulatory Visit: Attending: Cardiology | Admitting: Cardiology

## 2023-11-22 VITALS — BP 90/52 | HR 88 | Ht 74.0 in | Wt 225.2 lb

## 2023-11-22 DIAGNOSIS — E785 Hyperlipidemia, unspecified: Secondary | ICD-10-CM | POA: Diagnosis not present

## 2023-11-22 DIAGNOSIS — I25118 Atherosclerotic heart disease of native coronary artery with other forms of angina pectoris: Secondary | ICD-10-CM

## 2023-11-22 DIAGNOSIS — I447 Left bundle-branch block, unspecified: Secondary | ICD-10-CM

## 2023-11-22 DIAGNOSIS — Z951 Presence of aortocoronary bypass graft: Secondary | ICD-10-CM | POA: Diagnosis not present

## 2023-11-22 DIAGNOSIS — I5022 Chronic systolic (congestive) heart failure: Secondary | ICD-10-CM

## 2023-11-22 NOTE — Patient Instructions (Signed)
 Medication Instructions:  Continue same medications *If you need a refill on your cardiac medications before your next appointment, please call your pharmacy*  Lab Work: None ordered  Testing/Procedures: None ordered  Follow-Up: At [facility], you and your health needs are our priority.  As part of our continuing mission to provide you with exceptional heart care, our providers are all part of one team.  This team includes your primary Cardiologist (physician) and Advanced Practice Providers or APPs (Physician Assistants and Nurse Practitioners) who all work together to provide you with the care you need, when you need it.  Your next appointment:  6 months    Call in March to schedule May appointment     Provider:  Dr.Jordan   We recommend signing up for the patient portal called MyChart.  Sign up information is provided on this After Visit Summary.  MyChart is used to connect with patients for Virtual Visits (Telemedicine).  Patients are able to view lab/test results, encounter notes, upcoming appointments, etc.  Non-urgent messages can be sent to your provider as well.   To learn more about what you can do with MyChart, go to [email or web address].

## 2023-11-24 ENCOUNTER — Other Ambulatory Visit: Payer: Self-pay | Admitting: Cardiology

## 2023-11-29 DIAGNOSIS — M7061 Trochanteric bursitis, right hip: Secondary | ICD-10-CM | POA: Diagnosis not present

## 2023-11-29 DIAGNOSIS — M7062 Trochanteric bursitis, left hip: Secondary | ICD-10-CM | POA: Diagnosis not present

## 2023-12-09 DIAGNOSIS — E119 Type 2 diabetes mellitus without complications: Secondary | ICD-10-CM | POA: Diagnosis not present

## 2023-12-09 DIAGNOSIS — F5101 Primary insomnia: Secondary | ICD-10-CM | POA: Diagnosis not present

## 2023-12-09 DIAGNOSIS — M545 Low back pain, unspecified: Secondary | ICD-10-CM | POA: Diagnosis not present

## 2023-12-09 DIAGNOSIS — Z87442 Personal history of urinary calculi: Secondary | ICD-10-CM | POA: Diagnosis not present

## 2023-12-09 DIAGNOSIS — M5416 Radiculopathy, lumbar region: Secondary | ICD-10-CM | POA: Diagnosis not present

## 2023-12-09 DIAGNOSIS — R3911 Hesitancy of micturition: Secondary | ICD-10-CM | POA: Diagnosis not present

## 2023-12-10 DIAGNOSIS — M1711 Unilateral primary osteoarthritis, right knee: Secondary | ICD-10-CM | POA: Diagnosis not present

## 2023-12-10 NOTE — H&P (Signed)
------------------------------------------------------------------------------- °  Attestation signed by Toribio Fairy Badder, MD at 12/10/2023  9:31 AM I saw and evaluated the patient, reviewed the fellow's note and updated it as appropriate. I agree with the fellow's findings and plan. The patient is appropriate for performance of the procedure in the ambulatory surgery setting.  Electronically Signed by: Toribio Badder, MD, Attending Physician 12/10/2023 9:31 AM -------------------------------------------------------------------------------  [facility]  [facility]   DOS: 12/10/2023  Identification: [name] is a 68 y.o. year old male.   History of Present Illness Update from Previous Visit:  Reviewed, no changes from last visit  Overall Pain: Unchanged from previous visit  Past Medical History: Medical History[1]  Past Surgical History: Surgical History[2]  Allergies: is allergic to statins-hmg-coa reductase inhibitors.  Current Medications:  See outpatient medication list  Physical Exam:  AFVSS  Chest: unlabored breathing, equal chest expansion bilaterally, normal rate Cardiac: regular rate, no jugular venous distention or peripheral edema  MSK/Neuro exam unchanged from previous visit  Diagnosis: chronic knee pain Acute on Chronic pain : no   Impression Narrative:  No change from clinic visit  Patient Education - A description of the planned procedure was given to the patient.  The risks and benefits of the procedure were discussed with the patient and all questions were addressed to the patients satisfaction.  Risks and benefits were discussed at length with the patient. The risks included (but not only) increased pain following the procedure, failure of procedure and the fading of local anesthetic effect.  NPO guidelines were again d/w patient.  PLAN:  Continue with planned procedure -and appropriate for asc -  Yes  Physician Attestation: Seen by Janaid Hameed Sheikh, DO and attending       [1] Past Medical History: Diagnosis Date   Allergic 09/26/2023   Chest cold heavy mucus,coughing   Headache    Behind my eyes   High cholesterol    Hypertension    Pneumonia [id number]   Possibly   UTI (urinary tract infection)   [2] Past Surgical History: Procedure Laterality Date   CERVICAL SPINE SURGERY     CORONARY ANGIOPLASTY     with stents   CORONARY ARTERY BYPASS GRAFT     JOINT REPLACEMENT  12/17/2021   Right knee   LUMBAR DISC SURGERY     MAXILLOFACIAL SURGERY     RADIOFREQUENCY ABLATION Right 06/02/2023   ABLATION RADIOFREQUENCY GENICULAR - Right #1/1 performed by Toribio Fairy Badder, MD at [facility] OR   REPLACEMENT TOTAL KNEE Right    RHIZOTOMY W/ RADIOFREQUENCY ABLATION Left 09/03/2023   RHIZOTOMY FACET RADIOFREQUENCY - Left L2-L4 #1/2 performed by Toribio Fairy Badder, MD at [facility] OR   RHIZOTOMY W/ RADIOFREQUENCY ABLATION Right 09/15/2023   RHIZOTOMY FACET RADIOFREQUENCY  RIGHT L2-L4 #2/2 performed by Toribio Fairy Badder, MD at [facility] OR   SPINE SURGERY  Not sure   Multiple disc replacements   VENTRICULOPERITONEAL SHUNT  Unsure   I think 7 shunts

## 2023-12-13 DIAGNOSIS — M545 Low back pain, unspecified: Secondary | ICD-10-CM | POA: Diagnosis not present

## 2023-12-13 DIAGNOSIS — Z87442 Personal history of urinary calculi: Secondary | ICD-10-CM | POA: Diagnosis not present

## 2023-12-13 DIAGNOSIS — R3911 Hesitancy of micturition: Secondary | ICD-10-CM | POA: Diagnosis not present

## 2023-12-14 DIAGNOSIS — M0579 Rheumatoid arthritis with rheumatoid factor of multiple sites without organ or systems involvement: Secondary | ICD-10-CM | POA: Diagnosis not present

## 2023-12-14 NOTE — Progress Notes (Signed)
 Patient in office today for lab draw. Provider-ordered labs obtained. Reviewed draw site after-care. Patient tolerated well and denies any further questions at this time. Instructed patient that office will call with lab results, but to call if any questions or concerns arise. Pt verbalized understanding.

## 2023-12-16 ENCOUNTER — Emergency Department (HOSPITAL_BASED_OUTPATIENT_CLINIC_OR_DEPARTMENT_OTHER)
Admission: EM | Admit: 2023-12-16 | Discharge: 2023-12-16 | Disposition: A | Discharge status: Home / Self Care | Attending: Emergency Medicine | Admitting: Emergency Medicine

## 2023-12-16 ENCOUNTER — Other Ambulatory Visit: Payer: Self-pay

## 2023-12-16 ENCOUNTER — Emergency Department (HOSPITAL_BASED_OUTPATIENT_CLINIC_OR_DEPARTMENT_OTHER)

## 2023-12-16 DIAGNOSIS — Z79899 Other long term (current) drug therapy: Secondary | ICD-10-CM | POA: Insufficient documentation

## 2023-12-16 DIAGNOSIS — I10 Essential (primary) hypertension: Secondary | ICD-10-CM | POA: Insufficient documentation

## 2023-12-16 DIAGNOSIS — R10A0 Flank pain, unspecified side: Secondary | ICD-10-CM | POA: Diagnosis present

## 2023-12-16 DIAGNOSIS — K5730 Diverticulosis of large intestine without perforation or abscess without bleeding: Secondary | ICD-10-CM | POA: Diagnosis not present

## 2023-12-16 DIAGNOSIS — N3000 Acute cystitis without hematuria: Secondary | ICD-10-CM | POA: Diagnosis not present

## 2023-12-16 DIAGNOSIS — K5900 Constipation, unspecified: Secondary | ICD-10-CM | POA: Diagnosis not present

## 2023-12-16 DIAGNOSIS — K4090 Unilateral inguinal hernia, without obstruction or gangrene, not specified as recurrent: Secondary | ICD-10-CM | POA: Diagnosis not present

## 2023-12-16 DIAGNOSIS — M545 Low back pain, unspecified: Secondary | ICD-10-CM | POA: Diagnosis not present

## 2023-12-16 DIAGNOSIS — Z7982 Long term (current) use of aspirin: Secondary | ICD-10-CM | POA: Diagnosis not present

## 2023-12-16 LAB — COMPREHENSIVE METABOLIC PANEL WITH GFR
ALT: 21 U/L (ref 0–44)
AST: 18 U/L (ref 15–41)
Albumin: 3.9 g/dL (ref 3.5–5.0)
Alkaline Phosphatase: 62 U/L (ref 38–126)
Anion gap: 12 (ref 5–15)
BUN: 26 mg/dL — ABNORMAL HIGH (ref 8–23)
CO2: 24 mmol/L (ref 22–32)
Calcium: 9.6 mg/dL (ref 8.9–10.3)
Chloride: 101 mmol/L (ref 98–111)
Creatinine, Ser: 0.88 mg/dL (ref 0.61–1.24)
GFR, Estimated: >60 mL/min (ref >60)
Glucose, Bld: 175 mg/dL — ABNORMAL HIGH (ref 70–99)
Potassium: 4.2 mmol/L (ref 3.5–5.1)
Sodium: 137 mmol/L (ref 135–145)
Total Bilirubin: 1.2 mg/dL (ref 0.0–1.2)
Total Protein: 6.4 g/dL — ABNORMAL LOW (ref 6.5–8.1)

## 2023-12-16 LAB — CBC WITH DIFFERENTIAL/PLATELET
Abs Immature Granulocytes: 0.2 K/uL — ABNORMAL HIGH (ref 0.00–0.07)
Basophils Absolute: 0 K/uL (ref 0.0–0.1)
Basophils Relative: 0 %
Eosinophils Absolute: 0 K/uL (ref 0.0–0.5)
Eosinophils Relative: 0 %
HCT: 45.2 % (ref 39.0–52.0)
Hemoglobin: 15.4 g/dL (ref 13.0–17.0)
Immature Granulocytes: 2 %
Lymphocytes Relative: 13 %
Lymphs Abs: 1.1 K/uL (ref 0.7–4.0)
MCH: 33 pg (ref 26.0–34.0)
MCHC: 34.1 g/dL (ref 30.0–36.0)
MCV: 97 fL (ref 80.0–100.0)
Monocytes Absolute: 0.6 K/uL (ref 0.1–1.0)
Monocytes Relative: 7 %
Neutro Abs: 7.1 K/uL (ref 1.7–7.7)
Neutrophils Relative %: 78 %
Platelets: 143 K/uL — ABNORMAL LOW (ref 150–400)
RBC: 4.66 MIL/uL (ref 4.22–5.81)
RDW: 13.6 % (ref 11.5–15.5)
WBC: 9.1 K/uL (ref 4.0–10.5)
nRBC: 0 % (ref 0.0–0.2)

## 2023-12-16 LAB — URINALYSIS, ROUTINE W REFLEX MICROSCOPIC
Bilirubin Urine: NEGATIVE
Glucose, UA: NEGATIVE mg/dL
Hgb urine dipstick: NEGATIVE
Ketones, ur: NEGATIVE mg/dL
Nitrite: POSITIVE — AB
Specific Gravity, Urine: 1.032 — ABNORMAL HIGH (ref 1.005–1.030)
WBC, UA: >50 WBC/hpf (ref 0–5)
pH: 5.5 (ref 5.0–8.0)

## 2023-12-16 MED ORDER — CEPHALEXIN 250 MG PO CAPS
500.0000 mg | ORAL_CAPSULE | Freq: Once | ORAL | Status: AC
  Administered 2023-12-16: 500 mg via ORAL
  Filled 2023-12-16: qty 2

## 2023-12-16 MED ORDER — METHYLPREDNISOLONE 4 MG PO TBPK: ORAL_TABLET | ORAL | 0 refills | Status: DC

## 2023-12-16 MED ORDER — OXYCODONE HCL 5 MG PO TABS: 5.0000 mg | ORAL_TABLET | Freq: Four times a day (QID) | ORAL | 0 refills | Status: DC | PRN

## 2023-12-16 MED ORDER — OXYCODONE HCL 5 MG PO TABS
5.0000 mg | ORAL_TABLET | Freq: Once | ORAL | Status: AC
  Administered 2023-12-16: 5 mg via ORAL
  Filled 2023-12-16: qty 1

## 2023-12-16 MED ORDER — CEPHALEXIN 500 MG PO CAPS: 500.0000 mg | ORAL_CAPSULE | Freq: Three times a day (TID) | ORAL | 0 refills | Status: AC

## 2023-12-16 MED ORDER — MORPHINE SULFATE (PF) 4 MG/ML IV SOLN
4.0000 mg | Freq: Once | INTRAVENOUS | Status: AC
  Administered 2023-12-16: 4 mg via INTRAVENOUS
  Filled 2023-12-16: qty 1

## 2023-12-16 MED ORDER — CYCLOBENZAPRINE HCL 10 MG PO TABS: 10.0000 mg | ORAL_TABLET | Freq: Two times a day (BID) | ORAL | 0 refills | Status: DC | PRN

## 2023-12-16 NOTE — ED Notes (Signed)
Pt to the bathroom via wheelchair.

## 2023-12-16 NOTE — ED Triage Notes (Signed)
 Pt POV reporting back spasms since last Friday, CT and blood work neg this week, not prescribed pain meds.

## 2023-12-16 NOTE — ED Notes (Signed)
 Pt taken to CT via treatment bed. NAD at this time.

## 2023-12-16 NOTE — ED Provider Notes (Signed)
 [place] EMERGENCY DEPARTMENT AT [facility] Provider Note   CSN: [id number] Arrival date & time: 12/16/23  1902     Patient presents with: Back Pain   [name] is a 68 y.o. male.   Patient here with ongoing right lower back pain flank pain.  History of kidney stones.  Little bit of discomfort when [name] urinates.  [name] has had extensive back history as well.  Denies any weakness numbness tingling.  No loss of bowel or bladder.  Movement makes it worse.  Feels like prior kidney stones.  Feels like could be UTI.  [name] has a history of hypertension high cholesterol otherwise.  Denies any fever chills nausea.  The history is provided by the patient.       Prior to Admission medications  Medication Sig Start Date End Date Taking? Authorizing Provider  cephALEXin  (KEFLEX ) 500 MG capsule Take 1 capsule (500 mg total) by mouth 3 (three) times daily for 5 days. 12/16/23 12/21/23 Yes Dyanne Yorks, DO  cyclobenzaprine (FLEXERIL) 10 MG tablet Take 1 tablet (10 mg total) by mouth 2 (two) times daily as needed for muscle spasms. 12/16/23  Yes Angelli Baruch, DO  methylPREDNISolone  (MEDROL  DOSEPAK) 4 MG TBPK tablet Follow package insert 12/16/23  Yes Bretton Tandy, DO  oxyCODONE  (ROXICODONE ) 5 MG immediate release tablet Take 1 tablet (5 mg total) by mouth every 6 (six) hours as needed for up to 10 doses. 12/16/23  Yes Kalynne Womac, DO  acetaminophen  (TYLENOL ) 500 MG tablet Take 1,000 mg by mouth 3 (three) times daily. Pt. Reports [name] is taking half Patient taking differently: Take 1,000 mg by mouth every 6 (six) hours as needed for mild pain (pain score 1-3) or moderate pain (pain score 4-6). Pt. Reports [name] is taking half    [provider]  aspirin  81 MG EC tablet Take 81 mg by mouth in the morning.    [provider]  carvedilol  (COREG ) 6.25 MG tablet TAKE 1 TABLET BY MOUTH 2 TIMES DAILY WITH A meal ** must have office visit FOR addtional refills** 09/21/23   Jordan,  Peter M, MD  cetirizine (ZYRTEC) 10 MG tablet Take 10 mg by mouth daily as needed for allergies.    [provider]  dapagliflozin propanediol (FARXIGA) 5 MG TABS tablet Take 2.5 mg by mouth daily. 03/12/23   [provider]  DULoxetine  (CYMBALTA ) 20 MG capsule Take 40 mg by mouth daily.    [provider]  Evolocumab  (REPATHA  SURECLICK) 140 MG/ML SOAJ INJECT INTO THE SKIN EVERY 14 DAYS 08/30/23   Jordan, Peter M, MD  folic acid (FOLVITE) 1 MG tablet Take 1 mg by mouth daily. 11/10/23 11/09/24  [provider]  furosemide  (LASIX ) 40 MG tablet TAKE 1/2 TABLET BY MOUTH ONCE DAILY 11/26/23   Jordan, Peter M, MD  Glucosamine-Chondroitin (OSTEO BI-FLEX REGULAR STRENGTH PO) Take by mouth 2 (two) times daily.    [provider]  Misc Natural Products (DEEP SLEEP) CAPS Take 2 capsules by mouth at bedtime.    [provider]  Misc Natural Products (GLUCOSAMINE CHOND MSM FORMULA PO) Take 1 tablet by mouth in the morning and at bedtime.    [provider]  Multiple Vitamins-Minerals (AIRBORNE GUMMIES PO) Take 3 each by mouth 2 (two) times daily.    [provider]  Multiple Vitamins-Minerals (MULTIVITAMIN WITH MINERALS) tablet Take 1 tablet by mouth daily. Mega men multivitamin pak    [provider]  nitroGLYCERIN  (NITROSTAT ) 0.4 MG  SL tablet Place 1 tablet (0.4 mg total) under the tongue every 5 (five) minutes x 3 doses as needed for chest pain. 09/03/23   Jordan, Peter M, MD  ondansetron  (ZOFRAN -ODT) 8 MG disintegrating tablet Take 8 mg by mouth every 8 (eight) hours as needed for nausea. 05/12/16   [provider]  pantoprazole  (PROTONIX ) 40 MG tablet TAKE 1 TABLET BY MOUTH EVERY DAY 02/26/23   Sherlynn Madden, MD  predniSONE  (DELTASONE ) 5 MG tablet Take 5 mg by mouth daily with breakfast. 11/10/23   [provider]  valsartan  (DIOVAN ) 40 MG tablet TAKE 1 TABLET BY MOUTH ONCE DAILY 10/21/23   Emelia Josefa HERO, NP   zolpidem  (AMBIEN ) 10 MG tablet TAKE 1 TABLET BY MOUTH NIGHTLY AT BEDTIME 02/22/23   Sherlynn Madden, MD    Allergies: Niacin, Statins, and Zetia [ezetimibe]    Review of Systems  Updated Vital Signs BP 137/83   Pulse 83   Temp 97.6 F (36.4 C) (Temporal)   Resp 17   Ht 6' 1 (1.854 m)   Wt 101.2 kg   SpO2 94%   BMI 29.42 kg/m   Physical Exam Vitals and nursing note reviewed.  Constitutional:      General: [name] is not in acute distress.    Appearance: [name] is well-developed. [name] is not ill-appearing.  HENT:     Head: Normocephalic and atraumatic.  Eyes:     Conjunctiva/sclera: Conjunctivae normal.  Cardiovascular:     Rate and Rhythm: Normal rate and regular rhythm.     Pulses: Normal pulses.     Heart sounds: Normal heart sounds. No murmur heard. Pulmonary:     Effort: Pulmonary effort is normal. No respiratory distress.     Breath sounds: Normal breath sounds.  Abdominal:     Palpations: Abdomen is soft.     Tenderness: There is no abdominal tenderness. There is right CVA tenderness.  Musculoskeletal:        General: No swelling.     Cervical back: Normal range of motion and neck supple.  Skin:    General: Skin is warm and dry.     Capillary Refill: Capillary refill takes less than 2 seconds.  Neurological:     General: No focal deficit present.     Mental Status: [name] is alert.  Psychiatric:        Mood and Affect: Mood normal.     (all labs ordered are listed, but only abnormal results are displayed) Labs Reviewed  CBC WITH DIFFERENTIAL/PLATELET - Abnormal; Notable for the following components:      Result Value   Platelets 143 (*)    Abs Immature Granulocytes 0.20 (*)    All other components within normal limits  COMPREHENSIVE METABOLIC PANEL WITH GFR - Abnormal; Notable for the following components:   Glucose, Bld 175 (*)    BUN 26 (*)    Total Protein 6.4 (*)    All other components within normal limits  URINALYSIS, ROUTINE W REFLEX MICROSCOPIC -  Abnormal; Notable for the following components:   Specific Gravity, Urine 1.032 (*)    Protein, ur TRACE (*)    Nitrite POSITIVE (*)    Leukocytes,Ua MODERATE (*)    Bacteria, UA MANY (*)    All other components within normal limits  URINE CULTURE    EKG: None  Radiology: CT Renal Stone Study Result Date: 12/16/2023 CLINICAL DATA:  Abdomen pain flank pain EXAM: CT ABDOMEN AND PELVIS WITHOUT CONTRAST TECHNIQUE: Multidetector CT imaging of  the abdomen and pelvis was performed following the standard protocol without IV contrast. RADIATION DOSE REDUCTION: This exam was performed according to the departmental dose-optimization program which includes automated exposure control, adjustment of the mA and/or kV according to patient size and/or use of iterative reconstruction technique. COMPARISON:  Lumbar CT 05/04/2023, CT abdomen pelvis 05/15/2021 FINDINGS: Lower chest: Lung bases demonstrate small chronic left-sided pleural effusion with slightly thickened rim. Chronic ovoid consolidation at left lower lobe consistent with round atelectasis. Mild emphysema. Cardiomegaly and coronary vascular calcification. Partially visualized cardiac pacing leads. Hepatobiliary: No focal liver abnormality is seen. No gallstones, gallbladder wall thickening, or biliary dilatation. Pancreas: Unremarkable. No pancreatic ductal dilatation or surrounding inflammatory changes. Spleen: Normal in size without focal abnormality. Adrenals/Urinary Tract: Adrenal glands are normal. Nonspecific perinephric stranding. No hydronephrosis. The bladder is unremarkable Stomach/Bowel: The stomach is within normal limits. No dilated small bowel. No acute bowel wall thickening. Large stool burden. Vascular/Lymphatic: Mild diverticular disease of the left colon without acute wall thickening Reproductive: Negative prostate Other: Negative for ascites or free air. Small fat containing left inguinal hernia Musculoskeletal: Chronic compression  fracture T12. Chronic superior endplate deformity L3. mild inferior endplate deformity at L2 with minimal sclerosis, approximate 20% loss of height anteriorly of the vertebral body. This is new compared with the April CT. No significant paravertebral edema. Posterior spinal fusion hardware L4 through the sacrum with interbody device at L4-L5 and bilateral SI joint fixating screws. Retained screws at S1 segment. Residual grade 1 anterolisthesis L5 on S1. IMPRESSION: 1. No CT evidence for acute intra-abdominal or pelvic abnormality. No hydronephrosis or ureteral stone. 2. Large stool burden. Mild diverticular disease of the left colon without acute wall thickening. 3. Chronic small left-sided pleural effusion with rounded atelectasis at the left lower lobe. 4. Mild age indeterminate inferior endplate deformity at L2 with minimal sclerosis, new compared with the April CT. Chronic compression fractures at T12 and L3. 5. Aortic atherosclerosis. Aortic Atherosclerosis (ICD10-I70.0). Electronically Signed   By: Luke Bun M.D.   On: 12/16/2023 21:16     Procedures   Medications Ordered in the ED  cephALEXin  (KEFLEX ) capsule 500 mg (has no administration in time range)  oxyCODONE  (Oxy IR/ROXICODONE ) immediate release tablet 5 mg (has no administration in time range)  morphine  (PF) 4 MG/ML injection 4 mg (4 mg Intravenous Given 12/16/23 2120)                                    Medical Decision Making Amount and/or Complexity of Data Reviewed Labs: ordered. Radiology: ordered.  Risk Prescription drug management.   ARMONTE TORTORELLA is here with right sided back pain.  Chronic back pain but also history of kidney stones.  Differential diagnosis could be kidney stone versus UTI versus muscle spasm/chronic back issue.  Does not have any radicular symptoms.  [name] has no symptoms suggest cauda equina.  Have no concern for infectious spine process.  [name] is at normal vitals.  No fever.  Will give a dose of IV  morphine  check basic labs get a CT scan to abdomen and pelvis and get urinalysis and reevaluate.  Overall lab work shows no significant leukocytosis anemia or electrolyte abnormality.  CT scan shows no kidney stone.  Does have some constipation.  There is no obvious acute fracture in his low back.  I do think this could be constipation related but I also think that this  could be back and muscle spasm related.  Recommend MiraLAX for constipation.  Will give him oxycodone  for breakthrough pain Flexeril Medrol  Dosepak.  Urinalysis also suspicious for infection as well.  But is not really having any anterior abdominal pain.  Will treat with antibiotics.  No kidney stone seen on CT scan.  Ultimately I do think [name] could have muscle spasm also UTI.  I do not think [name] has cauda equina or have any concern for that but [name] understands return precautions if [name] develops any saddle anesthesia inability to urinate incontinence of stool weakness numbness in his legs.  Educated about the use of these medications.  Follow-up with primary care doctor.  Discharged in good condition.  This chart was dictated using voice recognition software.  Despite best efforts to proofread,  errors can occur which can change the documentation meaning.      Final diagnoses:  Acute right-sided low back pain without sciatica  Constipation, unspecified constipation type  Acute cystitis without hematuria    ED Discharge Orders          Ordered    oxyCODONE  (ROXICODONE ) 5 MG immediate release tablet  Every 6 hours PRN        12/16/23 2209    cyclobenzaprine (FLEXERIL) 10 MG tablet  2 times daily PRN        12/16/23 2209    methylPREDNISolone  (MEDROL  DOSEPAK) 4 MG TBPK tablet        12/16/23 2209    cephALEXin  (KEFLEX ) 500 MG capsule  3 times daily        12/16/23 2258               Ruthe Cornet, DO 12/16/23 2259

## 2023-12-16 NOTE — Discharge Instructions (Addendum)
 Recommend 1000 mg of Tylenol  every 6 hours as needed for pain.  Recommend Flexeril as a muscle relaxant and narcotic pain medicine Roxicodone  for breakthrough pain.  His medications are sedating so please be careful with its use.  Do not mix alcohol drugs or dangerous activities including driving.  Take steroid pack as prescribed.  Follow-up with your primary care doctor.  Return if symptoms worsen.  I recommend using 1-2 capfuls of MiraLAX in 20 ounces of water  daily until you have nice easy bowel movements.  You can titrate this to effect.  Take antibiotic as prescribed.  Return if symptoms worsen especially if you develop fever nausea vomiting or other symptoms.

## 2023-12-17 ENCOUNTER — Other Ambulatory Visit: Payer: Self-pay | Admitting: Cardiology

## 2023-12-17 DIAGNOSIS — E785 Hyperlipidemia, unspecified: Secondary | ICD-10-CM

## 2023-12-17 DIAGNOSIS — Z951 Presence of aortocoronary bypass graft: Secondary | ICD-10-CM

## 2023-12-19 LAB — URINE CULTURE: Culture: >=100000 — AB

## 2023-12-20 ENCOUNTER — Telehealth (HOSPITAL_BASED_OUTPATIENT_CLINIC_OR_DEPARTMENT_OTHER): Payer: Self-pay | Admitting: *Deleted

## 2023-12-20 ENCOUNTER — Telehealth: Payer: Self-pay

## 2023-12-20 ENCOUNTER — Ambulatory Visit

## 2023-12-20 DIAGNOSIS — I5022 Chronic systolic (congestive) heart failure: Secondary | ICD-10-CM

## 2023-12-20 DIAGNOSIS — I447 Left bundle-branch block, unspecified: Secondary | ICD-10-CM | POA: Diagnosis not present

## 2023-12-20 NOTE — Telephone Encounter (Signed)
 Patient events all TWOS.  Numerous events. Now has received a shock   Tried numerous times to reach patient on the phone to get him to come straight to the clinic for reprogramming.  Multiple VM left. Cannot reach patient.

## 2023-12-20 NOTE — Telephone Encounter (Signed)
 Pt called back wanting to know what to do about his ICD going off, he would like a call back

## 2023-12-20 NOTE — Progress Notes (Signed)
 Patient brought in to device clinic acutely d/t TWOS w/ inappropriate shock. Device interrogation & reprogramming changes made w/ Leontine, MDT Representative. Discussed w/ Dr. Cindie and the following programming changes are stated below in order to cover up TWOS.   Programming Changes: - V-V Pace Delay reprogrammed from 80ms to 40ms.  - RV sensitivity reprogrammed from 0.54mV to 0.71mV. - EGM 3 Source reprogrammed from LVtip to RVcoil to RVtip to RVring.   After reprogramming changes were made d/t TWOS, sensing, impedance and threshold tests were run. LV lead noted to be an epicardial lead. LV threshold test result 3.75V @ 1.56ms today. See programming changes below per Charlies Arthur, PA.    Programming Changes: - LV amplitude reprogrammed from 2.5V @ 1.105ms to 4.25V @ 1.34ms.  - LV capture management reprogrammed from Monitor to OFF.  - EffectivCRT During AF reprogrammed from ON to OFF.  Scheduled to F/U w/ Dr. Kennyth on 01/25/2023 @ 2:30pm for device optimization w/ MDT Rep to be present. Will continue to monitor and update accordingly.

## 2023-12-20 NOTE — Patient Instructions (Signed)
 Follow up as scheduled.

## 2023-12-20 NOTE — Telephone Encounter (Signed)
 Post ED Visit - Positive Culture Follow-up: Unsuccessful Patient Follow-up  Culture assessed and recommendations reviewed by:  [x]  Rankin Sams, Pharm.D. []  Venetia Gully, Pharm.D., BCPS AQ-ID []  Garrel Crews, Pharm.D., BCPS []  Almarie Lunger, [address].D., BCPS []  [place], [address].D., BCPS, AAHIVP []  Rosaline Bihari, Pharm.D., BCPS, AAHIVP []  Massie Rigg, PharmD []  Jodie Rower, PharmD, BCPS  Positive urine culture  []  Patient discharged without antimicrobial prescription and treatment is now indicated [x]  Organism is resistant to prescribed ED discharge antimicrobial []  Patient with positive blood cultures  Call Pt. If still symptomatic- change to Bactrim   800-160 mg (1 DS double strength tablet) BID x 7 days    ED Provider: Sarah Smoot, PA  Unable to contact patient  letter will be sent to address on file  Albino Alan Novak 12/20/2023, 3:00 PM

## 2023-12-20 NOTE — Progress Notes (Signed)
 ED Antimicrobial Stewardship Positive Culture Follow Up   CORNEILUS HEGGIE is an 68 y.o. male who presented to [facility] on 12/16/2023 with a chief complaint of R sided back pain  Chief Complaint  Patient presents with   Back Pain    Recent Results (from the past 720 hours)  Urine Culture     Status: Abnormal   Collection Time: 12/16/23 10:30 PM   Specimen: Urine, Clean Catch  Result Value Ref Range Status   Specimen Description   Final    URINE, CLEAN CATCH Performed at [facility], [address], [place], [place] [address]    Special Requests   Final    NONE Performed at [facility], [address], [place], [place] [address]    Culture (A)  Final    >=100,000 COLONIES/mL ESCHERICHIA COLI Confirmed Extended Spectrum Beta-Lactamase Producer (ESBL).  In bloodstream infections from ESBL organisms, carbapenems are preferred over piperacillin/tazobactam. They are shown to have a lower risk of mortality.    Report Status 12/19/2023 FINAL  Final   Organism ID, Bacteria ESCHERICHIA COLI (A)  Final      Susceptibility   Escherichia coli - MIC*    AMPICILLIN >=32 RESISTANT Resistant     CEFAZOLIN  (URINE) Value in next row Resistant      >=32 RESISTANTThis is a modified FDA-approved test that has been validated and its performance characteristics determined by the reporting laboratory.  This laboratory is certified under the Clinical Laboratory Improvement Amendments CLIA as qualified to perform high complexity clinical laboratory testing.    CEFEPIME Value in next row Resistant      >=32 RESISTANTThis is a modified FDA-approved test that has been validated and its performance characteristics determined by the reporting laboratory.  This laboratory is certified under the Clinical Laboratory Improvement Amendments CLIA as qualified to perform high complexity clinical laboratory testing.    CEFTRIAXONE Value in next row Resistant      >=32  RESISTANTThis is a modified FDA-approved test that has been validated and its performance characteristics determined by the reporting laboratory.  This laboratory is certified under the Clinical Laboratory Improvement Amendments CLIA as qualified to perform high complexity clinical laboratory testing.    CIPROFLOXACIN  Value in next row Resistant      >=32 RESISTANTThis is a modified FDA-approved test that has been validated and its performance characteristics determined by the reporting laboratory.  This laboratory is certified under the Clinical Laboratory Improvement Amendments CLIA as qualified to perform high complexity clinical laboratory testing.    GENTAMICIN  Value in next row Sensitive      >=32 RESISTANTThis is a modified FDA-approved test that has been validated and its performance characteristics determined by the reporting laboratory.  This laboratory is certified under the Clinical Laboratory Improvement Amendments CLIA as qualified to perform high complexity clinical laboratory testing.    NITROFURANTOIN  Value in next row Sensitive      >=32 RESISTANTThis is a modified FDA-approved test that has been validated and its performance characteristics determined by the reporting laboratory.  This laboratory is certified under the Clinical Laboratory Improvement Amendments CLIA as qualified to perform high complexity clinical laboratory testing.    TRIMETH /SULFA  Value in next row Sensitive      >=32 RESISTANTThis is a modified FDA-approved test that has been validated and its performance characteristics determined by the reporting laboratory.  This laboratory is certified under the Clinical Laboratory Improvement Amendments CLIA as qualified to perform high complexity clinical laboratory testing.  AMPICILLIN/SULBACTAM Value in next row Resistant      >=32 RESISTANTThis is a modified FDA-approved test that has been validated and its performance characteristics determined by the reporting  laboratory.  This laboratory is certified under the Clinical Laboratory Improvement Amendments CLIA as qualified to perform high complexity clinical laboratory testing.    PIP/TAZO Value in next row Sensitive      <=4 SENSITIVEThis is a modified FDA-approved test that has been validated and its performance characteristics determined by the reporting laboratory.  This laboratory is certified under the Clinical Laboratory Improvement Amendments CLIA as qualified to perform high complexity clinical laboratory testing.    MEROPENEM Value in next row Sensitive      <=4 SENSITIVEThis is a modified FDA-approved test that has been validated and its performance characteristics determined by the reporting laboratory.  This laboratory is certified under the Clinical Laboratory Improvement Amendments CLIA as qualified to perform high complexity clinical laboratory testing.    * >=100,000 COLONIES/mL ESCHERICHIA COLI    [x]  Treated with cephalexin , organism resistant to prescribed antimicrobial []  Patient discharged originally without antimicrobial agent and treatment is now indicated  New antibiotic prescription: Bactrim  800-160 mg (1 DS double strength tablet) BID x 7 days   ED Provider: Lauraine Pickett, PA   Rankin Sams 12/20/2023, 9:21 AM Clinical Pharmacist Monday - Friday phone -  [phone] Saturday - Sunday phone - [phone]

## 2023-12-20 NOTE — Telephone Encounter (Signed)
 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 Patient states he did feel a shock that knocked him down earlier.   Request for patient to come into device clinic today for reprogramming changes d/t TWOS. Patient planning to come in today 12/20/2023 at 1:30pm or before.   Spoke w/ [employer] representative who called tech support regarding device alerts. Will consider either vector or sensitivity change.   Will continue to monitor and update accordingly.

## 2023-12-20 NOTE — Telephone Encounter (Signed)
 Alert remote transmission:  1 or more monitored VT episodes detected. 1 monitored VT episode 12/13 @ 12:45, duration 20sec, HR 165, V>A.  49 NSVT, 1-7sec in duration, HR's 203-261  Upon EGM review - all events appear false related to TWOS (see examples below).  Consulting with industry for confirmation and will bring in for programming changes.

## 2023-12-21 LAB — CUP PACEART INCLINIC DEVICE CHECK
Date Time Interrogation Session: 20251215161120
Implantable Lead Connection Status: 753985
Implantable Lead Connection Status: 753985
Implantable Lead Connection Status: 753985
Implantable Lead Implant Date: 20190610
Implantable Lead Implant Date: 20200306
Implantable Lead Implant Date: 20200306
Implantable Lead Location: 753858
Implantable Lead Location: 753859
Implantable Lead Location: 753860
Implantable Lead Model: 5071
Implantable Lead Model: 5076
Implantable Pulse Generator Implant Date: 20250625

## 2023-12-22 MED ORDER — SULFAMETHOXAZOLE-TRIMETHOPRIM 800-160 MG PO TABS: 1.0000 | ORAL_TABLET | Freq: Two times a day (BID) | ORAL | 0 refills | Status: DC

## 2023-12-22 NOTE — ED Notes (Signed)
 This patient called and stated he has been calling for 2 days trying to get he antibiotic and the pharmacy still has not received the script.--Info given to charge nurse Jerri--I advised that someone will call the patient back today after this is researched.

## 2023-12-22 NOTE — Telephone Encounter (Signed)
 Patient's UA came back positive for E. coli which is resistant to Keflex .  Does appear to be sensitive to Bactrim  and Macrobid .  Normal renal function.  Will call into pharmacy

## 2023-12-25 ENCOUNTER — Emergency Department (HOSPITAL_BASED_OUTPATIENT_CLINIC_OR_DEPARTMENT_OTHER)

## 2023-12-25 ENCOUNTER — Inpatient Hospital Stay (HOSPITAL_BASED_OUTPATIENT_CLINIC_OR_DEPARTMENT_OTHER)
Admission: EM | Admit: 2023-12-25 | Discharge: 2023-12-30 | DRG: 690 | Disposition: A | Discharge status: Home / Self Care | Attending: Internal Medicine | Admitting: Internal Medicine

## 2023-12-25 ENCOUNTER — Encounter (HOSPITAL_BASED_OUTPATIENT_CLINIC_OR_DEPARTMENT_OTHER): Payer: Self-pay

## 2023-12-25 ENCOUNTER — Other Ambulatory Visit: Payer: Self-pay

## 2023-12-25 DIAGNOSIS — Z981 Arthrodesis status: Secondary | ICD-10-CM

## 2023-12-25 DIAGNOSIS — F419 Anxiety disorder, unspecified: Secondary | ICD-10-CM | POA: Diagnosis present

## 2023-12-25 DIAGNOSIS — I2510 Atherosclerotic heart disease of native coronary artery without angina pectoris: Secondary | ICD-10-CM | POA: Diagnosis present

## 2023-12-25 DIAGNOSIS — I252 Old myocardial infarction: Secondary | ICD-10-CM

## 2023-12-25 DIAGNOSIS — Z8249 Family history of ischemic heart disease and other diseases of the circulatory system: Secondary | ICD-10-CM

## 2023-12-25 DIAGNOSIS — K219 Gastro-esophageal reflux disease without esophagitis: Secondary | ICD-10-CM | POA: Diagnosis present

## 2023-12-25 DIAGNOSIS — A499 Bacterial infection, unspecified: Secondary | ICD-10-CM

## 2023-12-25 DIAGNOSIS — Z955 Presence of coronary angioplasty implant and graft: Secondary | ICD-10-CM

## 2023-12-25 DIAGNOSIS — I5022 Chronic systolic (congestive) heart failure: Secondary | ICD-10-CM | POA: Diagnosis not present

## 2023-12-25 DIAGNOSIS — M0579 Rheumatoid arthritis with rheumatoid factor of multiple sites without organ or systems involvement: Secondary | ICD-10-CM | POA: Diagnosis present

## 2023-12-25 DIAGNOSIS — Z1612 Extended spectrum beta lactamase (ESBL) resistance: Secondary | ICD-10-CM | POA: Diagnosis present

## 2023-12-25 DIAGNOSIS — I10 Essential (primary) hypertension: Secondary | ICD-10-CM | POA: Diagnosis present

## 2023-12-25 DIAGNOSIS — I2583 Coronary atherosclerosis due to lipid rich plaque: Secondary | ICD-10-CM | POA: Diagnosis not present

## 2023-12-25 DIAGNOSIS — B9620 Unspecified Escherichia coli [E. coli] as the cause of diseases classified elsewhere: Secondary | ICD-10-CM | POA: Diagnosis present

## 2023-12-25 DIAGNOSIS — Z951 Presence of aortocoronary bypass graft: Secondary | ICD-10-CM

## 2023-12-25 DIAGNOSIS — N390 Urinary tract infection, site not specified: Secondary | ICD-10-CM | POA: Diagnosis not present

## 2023-12-25 DIAGNOSIS — E119 Type 2 diabetes mellitus without complications: Secondary | ICD-10-CM | POA: Diagnosis present

## 2023-12-25 DIAGNOSIS — Z888 Allergy status to other drugs, medicaments and biological substances status: Secondary | ICD-10-CM

## 2023-12-25 DIAGNOSIS — Z882 Allergy status to sulfonamides status: Secondary | ICD-10-CM

## 2023-12-25 DIAGNOSIS — Z833 Family history of diabetes mellitus: Secondary | ICD-10-CM

## 2023-12-25 DIAGNOSIS — Z95810 Presence of automatic (implantable) cardiac defibrillator: Secondary | ICD-10-CM

## 2023-12-25 DIAGNOSIS — Z87891 Personal history of nicotine dependence: Secondary | ICD-10-CM

## 2023-12-25 DIAGNOSIS — E7800 Pure hypercholesterolemia, unspecified: Secondary | ICD-10-CM | POA: Diagnosis present

## 2023-12-25 DIAGNOSIS — Z803 Family history of malignant neoplasm of breast: Secondary | ICD-10-CM

## 2023-12-25 DIAGNOSIS — J019 Acute sinusitis, unspecified: Secondary | ICD-10-CM | POA: Diagnosis present

## 2023-12-25 DIAGNOSIS — I447 Left bundle-branch block, unspecified: Secondary | ICD-10-CM | POA: Diagnosis present

## 2023-12-25 DIAGNOSIS — I110 Hypertensive heart disease with heart failure: Secondary | ICD-10-CM | POA: Diagnosis present

## 2023-12-25 DIAGNOSIS — G4700 Insomnia, unspecified: Secondary | ICD-10-CM | POA: Diagnosis present

## 2023-12-25 DIAGNOSIS — B9629 Other Escherichia coli [E. coli] as the cause of diseases classified elsewhere: Secondary | ICD-10-CM | POA: Diagnosis not present

## 2023-12-25 DIAGNOSIS — Z79899 Other long term (current) drug therapy: Secondary | ICD-10-CM

## 2023-12-25 DIAGNOSIS — G8929 Other chronic pain: Secondary | ICD-10-CM | POA: Diagnosis present

## 2023-12-25 DIAGNOSIS — Z87440 Personal history of urinary (tract) infections: Secondary | ICD-10-CM

## 2023-12-25 DIAGNOSIS — R369 Urethral discharge, unspecified: Secondary | ICD-10-CM | POA: Clinically undetermined

## 2023-12-25 DIAGNOSIS — N10 Acute tubulo-interstitial nephritis: Principal | ICD-10-CM

## 2023-12-25 DIAGNOSIS — Z96651 Presence of right artificial knee joint: Secondary | ICD-10-CM | POA: Diagnosis present

## 2023-12-25 DIAGNOSIS — Z823 Family history of stroke: Secondary | ICD-10-CM

## 2023-12-25 DIAGNOSIS — Z7984 Long term (current) use of oral hypoglycemic drugs: Secondary | ICD-10-CM

## 2023-12-25 DIAGNOSIS — M549 Dorsalgia, unspecified: Secondary | ICD-10-CM | POA: Diagnosis present

## 2023-12-25 DIAGNOSIS — R7989 Other specified abnormal findings of blood chemistry: Secondary | ICD-10-CM

## 2023-12-25 DIAGNOSIS — Z7982 Long term (current) use of aspirin: Secondary | ICD-10-CM

## 2023-12-25 LAB — CBC WITH DIFFERENTIAL/PLATELET
Abs Immature Granulocytes: 0.2 K/uL — ABNORMAL HIGH (ref 0.00–0.07)
Basophils Absolute: 0.1 K/uL (ref 0.0–0.1)
Basophils Relative: 1 %
Eosinophils Absolute: 0.1 K/uL (ref 0.0–0.5)
Eosinophils Relative: 1 %
HCT: 48.1 % (ref 39.0–52.0)
Hemoglobin: 16 g/dL (ref 13.0–17.0)
Immature Granulocytes: 2 %
Lymphocytes Relative: 11 %
Lymphs Abs: 1.1 K/uL (ref 0.7–4.0)
MCH: 33 pg (ref 26.0–34.0)
MCHC: 33.3 g/dL (ref 30.0–36.0)
MCV: 99.2 fL (ref 80.0–100.0)
Monocytes Absolute: 0.5 K/uL (ref 0.1–1.0)
Monocytes Relative: 5 %
Neutro Abs: 8.3 K/uL — ABNORMAL HIGH (ref 1.7–7.7)
Neutrophils Relative %: 80 %
Platelets: 172 K/uL (ref 150–400)
RBC: 4.85 MIL/uL (ref 4.22–5.81)
RDW: 13.8 % (ref 11.5–15.5)
WBC: 10.2 K/uL (ref 4.0–10.5)
nRBC: 0 % (ref 0.0–0.2)

## 2023-12-25 LAB — URINALYSIS, ROUTINE W REFLEX MICROSCOPIC
Bilirubin Urine: NEGATIVE
Glucose, UA: >1000 mg/dL — AB
Ketones, ur: NEGATIVE mg/dL
Nitrite: POSITIVE — AB
Protein, ur: 30 mg/dL — AB
Specific Gravity, Urine: 1.032 — ABNORMAL HIGH (ref 1.005–1.030)
WBC, UA: >50 WBC/hpf (ref 0–5)
pH: 6 (ref 5.0–8.0)

## 2023-12-25 LAB — COMPREHENSIVE METABOLIC PANEL WITH GFR
ALT: 29 U/L (ref 0–44)
AST: 22 U/L (ref 15–41)
Albumin: 3.9 g/dL (ref 3.5–5.0)
Alkaline Phosphatase: 73 U/L (ref 38–126)
Anion gap: 9 (ref 5–15)
BUN: 17 mg/dL (ref 8–23)
CO2: 27 mmol/L (ref 22–32)
Calcium: 9.4 mg/dL (ref 8.9–10.3)
Chloride: 101 mmol/L (ref 98–111)
Creatinine, Ser: 1.01 mg/dL (ref 0.61–1.24)
GFR, Estimated: >60 mL/min
Glucose, Bld: 268 mg/dL — ABNORMAL HIGH (ref 70–99)
Potassium: 4.2 mmol/L (ref 3.5–5.1)
Sodium: 137 mmol/L (ref 135–145)
Total Bilirubin: 1.1 mg/dL (ref 0.0–1.2)
Total Protein: 6.5 g/dL (ref 6.5–8.1)

## 2023-12-25 LAB — LACTIC ACID, PLASMA
Lactic Acid, Venous: 1.7 mmol/L (ref 0.5–1.9)
Lactic Acid, Venous: 1.4 mmol/L (ref 0.5–1.9)

## 2023-12-25 LAB — CBG MONITORING, ED: Glucose-Capillary: 254 mg/dL — ABNORMAL HIGH (ref 70–99)

## 2023-12-25 LAB — LIPASE, BLOOD: Lipase: 44 U/L (ref 11–51)

## 2023-12-25 LAB — GLUCOSE, CAPILLARY: Glucose-Capillary: 268 mg/dL — ABNORMAL HIGH (ref 70–99)

## 2023-12-25 MED ORDER — POLYETHYLENE GLYCOL 3350 17 G PO PACK
17.0000 g | PACK | Freq: Every day | ORAL | Status: DC | PRN
  Filled 2023-12-25: qty 1

## 2023-12-25 MED ORDER — ZOLPIDEM TARTRATE 5 MG PO TABS
5.0000 mg | ORAL_TABLET | Freq: Every evening | ORAL | Status: DC | PRN
  Administered 2023-12-25: 5 mg via ORAL
  Filled 2023-12-25: qty 1

## 2023-12-25 MED ORDER — FOLIC ACID 1 MG PO TABS: 1.0000 mg | ORAL_TABLET | Freq: Every day | ORAL | Status: DC

## 2023-12-25 MED ORDER — INSULIN ASPART 100 UNIT/ML IJ SOLN: 0.0000 [IU] | Freq: Every day | INTRAMUSCULAR | Status: DC

## 2023-12-25 MED ORDER — SODIUM CHLORIDE 0.9 % IV BOLUS
1000.0000 mL | Freq: Once | INTRAVENOUS | Status: AC
  Administered 2023-12-25: 1000 mL via INTRAVENOUS

## 2023-12-25 MED ORDER — CARVEDILOL 6.25 MG PO TABS
6.2500 mg | ORAL_TABLET | Freq: Two times a day (BID) | ORAL | Status: DC
  Administered 2023-12-26: 6.25 mg via ORAL
  Filled 2023-12-25 (×4): qty 1

## 2023-12-25 MED ORDER — IRBESARTAN 75 MG PO TABS
37.5000 mg | ORAL_TABLET | Freq: Every day | ORAL | Status: DC
  Administered 2023-12-26: 37.5 mg via ORAL
  Filled 2023-12-25: qty 1

## 2023-12-25 MED ORDER — OXYCODONE HCL 5 MG PO TABS: 5.0000 mg | ORAL_TABLET | ORAL | Status: DC | PRN

## 2023-12-25 MED ORDER — INSULIN ASPART 100 UNIT/ML IJ SOLN
0.0000 [IU] | Freq: Three times a day (TID) | INTRAMUSCULAR | Status: DC
  Filled 2023-12-25: qty 1

## 2023-12-25 MED ORDER — PIPERACILLIN-TAZOBACTAM 3.375 G IVPB
3.3750 g | Freq: Once | INTRAVENOUS | Status: DC
  Administered 2023-12-25: 3.375 g via INTRAVENOUS
  Filled 2023-12-25: qty 50

## 2023-12-25 MED ORDER — FUROSEMIDE 20 MG PO TABS
20.0000 mg | ORAL_TABLET | Freq: Every day | ORAL | Status: DC
  Administered 2023-12-26 – 2023-12-27 (×2): 20 mg via ORAL
  Filled 2023-12-25 (×3): qty 1

## 2023-12-25 MED ORDER — PROCHLORPERAZINE EDISYLATE 10 MG/2ML IJ SOLN: 5.0000 mg | Freq: Four times a day (QID) | INTRAMUSCULAR | Status: DC | PRN

## 2023-12-25 MED ORDER — SODIUM CHLORIDE 0.9 % IV SOLN
1.0000 g | Freq: Three times a day (TID) | INTRAVENOUS | Status: DC
  Administered 2023-12-25 – 2023-12-26 (×2): 1 g via INTRAVENOUS
  Filled 2023-12-25 (×3): qty 20

## 2023-12-25 MED ORDER — ACETAMINOPHEN 325 MG PO TABS
650.0000 mg | ORAL_TABLET | Freq: Four times a day (QID) | ORAL | Status: DC | PRN
  Administered 2023-12-26 – 2023-12-28 (×3): 650 mg via ORAL
  Filled 2023-12-25 (×12): qty 2

## 2023-12-25 MED ORDER — IOHEXOL 300 MG/ML  SOLN
100.0000 mL | Freq: Once | INTRAMUSCULAR | Status: AC | PRN
  Administered 2023-12-25: 100 mL via INTRAVENOUS

## 2023-12-25 MED ORDER — PREDNISONE 5 MG PO TABS: 5.0000 mg | ORAL_TABLET | Freq: Every day | ORAL | Status: DC

## 2023-12-25 MED ORDER — SODIUM CHLORIDE 0.9% FLUSH
3.0000 mL | Freq: Two times a day (BID) | INTRAVENOUS | Status: DC
  Administered 2023-12-25 – 2023-12-26 (×2): 3 mL via INTRAVENOUS

## 2023-12-25 MED ORDER — DULOXETINE HCL 20 MG PO CPEP
40.0000 mg | ORAL_CAPSULE | Freq: Every day | ORAL | Status: DC
  Administered 2023-12-26 – 2023-12-29 (×4): 40 mg via ORAL
  Filled 2023-12-25 (×6): qty 2

## 2023-12-25 MED ORDER — ENOXAPARIN SODIUM 40 MG/0.4ML IJ SOSY
40.0000 mg | PREFILLED_SYRINGE | INTRAMUSCULAR | Status: DC
  Administered 2023-12-25: 40 mg via SUBCUTANEOUS
  Filled 2023-12-25: qty 0.4

## 2023-12-25 MED ORDER — PANTOPRAZOLE SODIUM 40 MG PO TBEC
40.0000 mg | DELAYED_RELEASE_TABLET | Freq: Every day | ORAL | Status: DC
  Administered 2023-12-26 – 2023-12-30 (×5): 40 mg via ORAL
  Filled 2023-12-25 (×6): qty 1

## 2023-12-25 MED ORDER — ACETAMINOPHEN 650 MG RE SUPP: 650.0000 mg | Freq: Four times a day (QID) | RECTAL | Status: DC | PRN

## 2023-12-25 NOTE — H&P (Signed)
 " History and Physical    [name] FMW:[id number] DOB: 1955/08/11 DOA: 12/25/2023  PCP: Emerick Avelina POUR, PA-C   Patient coming from: Home   Chief Complaint: Right flank pain, fatigue, dysuria   HPI: [name] is a 68 y.o. male with medical history significant for CAD status post CABG, chronic HFrEF with ICD, rheumatoid arthritis, and UTI due to ESBL producing E. coli who presents with ongoing right flank pain, fatigue, and mild dysuria despite treatment with Bactrim .  Patient was seen in the ED on 12/16/2023 with right flank pain and mild dysuria.  He was prescribed Keflex  at that time but urine culture grew >100k cfu/mL ESBL producing E. coli that was sensitive to Bactrim .  He has not experienced any improvement with Bactrim , prompting his return today.  He denies any fevers or chills.  He has mild chest discomfort ever since his ICD fired a few days ago but no anginal complaints; this shock was felt to be in appropriate and his ICD was reprogrammed on 12/20/2023.  He has not had any lightheadedness or syncope.  [facility] ED Course: Upon arrival to the ED, patient is found to be afebrile and saturating well on room air with normal RR, normal HR, and stable BP.  Labs are most notable for glucose 268, normal creatinine, normal CBC, and normal lactate.  UA demonstrates bacteriuria and pyuria.  There are no acute findings on CT of the abdomen and pelvis.  There are no acute intracranial abnormalities on head CT.  Urine culture was collected in the ED, patient was treated with Zosyn  and 1 L NS, and he was transferred to [facility] for admission.  Review of Systems:  All other systems reviewed and apart from HPI, are negative.  Past Medical History:  Diagnosis Date   AICD (automatic cardioverter/defibrillator) present    Anxiety    Arthritis    mild; back, neck, spine (06/09/2017)   CHF (congestive heart failure) (HCC)    Chronic back pain    Coronary artery  disease    GERD (gastroesophageal reflux disease)    History of gout    History of kidney stones X 2   Hypercholesterolemia    Hypertension    LBBB (left bundle branch block)    MI (myocardial infarction) (HCC) 1993   LATERAL   MI (myocardial infarction) (HCC) ?09/2001; 06/07/2017   Pneumonia    several times (06/09/2017)   Pre-diabetes    S/P coronary artery stent placement    RCA   Varicose veins     Past Surgical History:  Procedure Laterality Date   ANTERIOR CERVICAL DECOMP/DISCECTOMY FUSION  03/03/2011   Procedure: ANTERIOR CERVICAL DECOMPRESSION/DISCECTOMY FUSION 3 LEVELS;  Surgeon: Catalina CHRISTELLA Stains, MD;  Location: [facility] NEURO ORS;  Service: Neurosurgery;  Laterality: N/A;  Cervical three-four Cervical four-five Cervical five-six Anterior cervical decompression/diskectomy, fusion   APPLICATION OF ROBOTIC ASSISTANCE FOR SPINAL PROCEDURE N/A 10/03/2020   Procedure: APPLICATION OF ROBOTIC ASSISTANCE FOR SPINAL PROCEDURE;  Surgeon: Lanis Pupa, MD;  Location: [facility] OR;  Service: Neurosurgery;  Laterality: N/A;   BACK SURGERY     BIV ICD GENERATOR CHANGEOUT N/A 06/30/2023   Procedure: BIV ICD GENERATOR CHANGEOUT;  Surgeon: Kennyth Chew, MD;  Location: [facility] INVASIVE CV LAB;  Service: Cardiovascular;  Laterality: N/A;   CARDIAC CATHETERIZATION  2009   Stents in RCA patent. 70 to 80% PL, and 50% ostial PD.    CARDIAC CATHETERIZATION N/A 11/20/2014   Procedure: Left Heart Cath and  Coronary Angiography;  Surgeon: Peter M Jordan, MD;  Location: [facility] INVASIVE CV LAB;  Service: Cardiovascular;  Laterality: N/A;   CORONARY ANGIOPLASTY     DIRECT ANGIOPLASTY THE MARGINAL BRANCH   CORONARY ANGIOPLASTY WITH STENT PLACEMENT     RCA   CORONARY ARTERY BYPASS GRAFT N/A 06/14/2017   Procedure: CORONARY ARTERY BYPASS GRAFTING (CABG) x four, using left internal mammary artery and right leg greater saphenous vein harvested endoscopically;  Surgeon: Fleeta Hanford Coy, MD;  Location: [facility] OR;  Service: Open  Heart Surgery;  Laterality: N/A;   CORONARY PRESSURE/FFR STUDY N/A 06/24/2021   Procedure: INTRAVASCULAR PRESSURE WIRE/FFR STUDY;  Surgeon: Anner Alm ORN, MD;  Location: [facility] INVASIVE CV LAB;  Service: Cardiovascular;  Laterality: N/A;   CORONARY STENT INTERVENTION N/A 06/24/2021   Procedure: CORONARY STENT INTERVENTION;  Surgeon: Anner Alm ORN, MD;  Location: [facility] INVASIVE CV LAB;  Service: Cardiovascular;  Laterality: N/A;   CYSTOSCOPY N/A 06/14/2017   Procedure: CYSTOSCOPY;  Surgeon: Fleeta Hanford Coy, MD;  Location: [facility] OR;  Service: Open Heart Surgery;  Laterality: N/A;   ICD IMPLANT N/A 03/11/2018   Procedure: ICD IMPLANT - Dual Chamber;  Surgeon: Fernande Elspeth BROCKS, MD;  Location: [facility] INVASIVE CV LAB;  Service: Cardiovascular;  Laterality: N/A;   INSERTION OF SUPRAPUBIC CATHETER N/A 06/14/2017   Procedure: INSERTION OF SUPRAPUBIC CATHETER - Lower abdomen;  Surgeon: Fleeta Hanford Coy, MD;  Location: [facility] OR;  Service: Open Heart Surgery;  Laterality: N/A;   POSTERIOR LUMBAR FUSION  10/2011   RIGHT/LEFT HEART CATH AND CORONARY ANGIOGRAPHY N/A 06/08/2017   Procedure: RIGHT/LEFT HEART CATH AND CORONARY ANGIOGRAPHY;  Surgeon: Jordan, Peter M, MD;  Location: [facility] INVASIVE CV LAB;  Service: Cardiovascular;  Laterality: N/A;   RIGHT/LEFT HEART CATH AND CORONARY/GRAFT ANGIOGRAPHY N/A 09/06/2020   Procedure: RIGHT/LEFT HEART CATH AND CORONARY/GRAFT ANGIOGRAPHY;  Surgeon: Jordan, Peter M, MD;  Location: [facility] INVASIVE CV LAB;  Service: Cardiovascular;  Laterality: N/A;   RIGHT/LEFT HEART CATH AND CORONARY/GRAFT ANGIOGRAPHY N/A 06/24/2021   Procedure: RIGHT/LEFT HEART CATH AND CORONARY/GRAFT ANGIOGRAPHY;  Surgeon: Anner Alm ORN, MD;  Location: [facility] INVASIVE CV LAB;  Service: Cardiovascular;  Laterality: N/A;   TEE WITHOUT CARDIOVERSION N/A 06/14/2017   Procedure: TRANSESOPHAGEAL ECHOCARDIOGRAM (TEE);  Surgeon: Fleeta Hanford, Coy, MD;  Location: [facility] OR;  Service: Open Heart Surgery;  Laterality: N/A;   TOTAL KNEE ARTHROPLASTY  Right 12/17/2021   Procedure: TOTAL KNEE ARTHROPLASTY;  Surgeon: Gerome Charleston, MD;  Location: [facility] ORS;  Service: Orthopedics;  Laterality: Right;  adductor canal  120   URETHROPLASTY N/A 10/15/2017   Procedure: URETHROPLASTY WITH BUCCAL GRAFT HARVAST;  Surgeon: Cam Morene ORN, MD;  Location: [facility] ORS;  Service: Urology;  Laterality: N/A;    Social History:   reports that he quit smoking about 24 years ago. His smoking use included cigarettes. He started smoking about 44 years ago. He has a 40 pack-year smoking history. He has never used smokeless tobacco. He reports that he does not currently use alcohol. He reports that he does not use drugs.  Allergies[1]  Family History  Problem Relation Age of Onset   Breast cancer Mother    Coronary artery disease Mother    Stroke Father    Heart attack Father    Diabetes Father    Heart attack Brother    Heart disease Brother    Heart attack Maternal Grandfather      Prior to Admission medications  Medication Sig Start Date End Date Taking? Authorizing Provider  acetaminophen  (TYLENOL ) 500 MG tablet Take 1,000 mg by mouth 3 (three) times daily. Pt. Reports he is taking half Patient taking differently: Take 1,000 mg by mouth every 6 (six) hours as needed for mild pain (pain score 1-3) or moderate pain (pain score 4-6). Pt. Reports he is taking half    [provider]  aspirin  81 MG EC tablet Take 81 mg by mouth in the morning.    [provider]  carvedilol  (COREG ) 6.25 MG tablet TAKE 1 TABLET BY MOUTH 2 TIMES DAILY WITH A meal ** must have office visit FOR addtional refills** 09/21/23   Jordan, Peter M, MD  cetirizine (ZYRTEC) 10 MG tablet Take 10 mg by mouth daily as needed for allergies.    [provider]  cyclobenzaprine  (FLEXERIL ) 10 MG tablet Take 1 tablet (10 mg total) by mouth 2 (two) times daily as needed for muscle spasms. 12/16/23   Curatolo, Adam, DO  dapagliflozin propanediol (FARXIGA) 5 MG TABS  tablet Take 2.5 mg by mouth daily. 03/12/23   [provider]  DULoxetine  (CYMBALTA ) 20 MG capsule Take 40 mg by mouth daily.    [provider]  folic acid  (FOLVITE ) 1 MG tablet Take 1 mg by mouth daily. 11/10/23 11/09/24  [provider]  furosemide  (LASIX ) 40 MG tablet TAKE 1/2 TABLET BY MOUTH ONCE DAILY 11/26/23   Jordan, Peter M, MD  Glucosamine-Chondroitin (OSTEO BI-FLEX REGULAR STRENGTH PO) Take by mouth 2 (two) times daily.    [provider]  methylPREDNISolone  (MEDROL  DOSEPAK) 4 MG TBPK tablet Follow package insert 12/16/23   Curatolo, Adam, DO  Misc Natural Products (DEEP SLEEP) CAPS Take 2 capsules by mouth at bedtime.    [provider]  Misc Natural Products (GLUCOSAMINE CHOND MSM FORMULA PO) Take 1 tablet by mouth in the morning and at bedtime.    [provider]  Multiple Vitamins-Minerals (AIRBORNE GUMMIES PO) Take 3 each by mouth 2 (two) times daily.    [provider]  Multiple Vitamins-Minerals (MULTIVITAMIN WITH MINERALS) tablet Take 1 tablet by mouth daily. Mega men multivitamin pak    [provider]  nitroGLYCERIN  (NITROSTAT ) 0.4 MG SL tablet Place 1 tablet (0.4 mg total) under the tongue every 5 (five) minutes x 3 doses as needed for chest pain. 09/03/23   Jordan, Peter M, MD  ondansetron  (ZOFRAN -ODT) 8 MG disintegrating tablet Take 8 mg by mouth every 8 (eight) hours as needed for nausea. 05/12/16   [provider]  oxyCODONE  (ROXICODONE ) 5 MG immediate release tablet Take 1 tablet (5 mg total) by mouth every 6 (six) hours as needed for up to 10 doses. 12/16/23   Curatolo, Adam, DO  pantoprazole  (PROTONIX ) 40 MG tablet TAKE 1 TABLET BY MOUTH EVERY DAY 02/26/23   Sherlynn Madden, MD  predniSONE  (DELTASONE ) 5 MG tablet Take 5 mg by mouth daily with breakfast. 11/10/23   [provider]  REPATHA  SURECLICK 140 MG/ML SOAJ inject into THE SKIN EVERY 14 DAYS 12/20/23   Jordan, Peter M, MD   sulfamethoxazole -trimethoprim  (BACTRIM  DS) 800-160 MG tablet Take 1 tablet by mouth 2 (two) times daily for 7 days. 12/22/23 12/29/23  Doretha Folks, MD  valsartan  (DIOVAN ) 40 MG tablet TAKE 1 TABLET BY MOUTH ONCE DAILY 10/21/23   Cleaver, Jesse M, NP  zolpidem  (AMBIEN ) 10 MG tablet TAKE 1 TABLET BY MOUTH NIGHTLY AT BEDTIME 02/22/23   Sherlynn Madden, MD    Physical Exam: Vitals:   12/25/23 1700 12/25/23 1800 12/25/23 1857 12/25/23  1925  BP: 138/77 138/80  (!) 152/86  Pulse: 87 84  86  Resp:  16  16  Temp:   98.1 F (36.7 C) 98.3 F (36.8 C)  TempSrc:   Oral   SpO2: 94% 95%  97%     Constitutional: NAD, no pallor or diaphoresis   Eyes: PERTLA, lids and conjunctivae normal ENMT: Mucous membranes are moist. Posterior pharynx clear of any exudate or lesions.   Neck: supple, no masses  Respiratory: no wheezing, no crackles. No accessory muscle use.  Cardiovascular: S1 & S2 heard, regular rate and rhythm. Trace lower extremity edema.  Abdomen: No tenderness, soft. Bowel sounds active.  Musculoskeletal: no clubbing / cyanosis. No joint deformity upper and lower extremities.   Skin: no significant rashes, lesions, ulcers. Warm, dry, well-perfused. Neurologic: CN 2-12 grossly intact. Moving all extremities. Alert and oriented.  Psychiatric: Calm. Cooperative.    Labs and Imaging on Admission: I have personally reviewed following labs and imaging studies  CBC: Recent Labs  Lab 12/25/23 1426  WBC 10.2  NEUTROABS 8.3*  HGB 16.0  HCT 48.1  MCV 99.2  PLT 172   Basic Metabolic Panel: Recent Labs  Lab 12/25/23 1426  NA 137  K 4.2  CL 101  CO2 27  GLUCOSE 268*  BUN 17  CREATININE 1.01  CALCIUM 9.4   GFR: Estimated Creatinine Clearance: 86.3 mL/min (by C-G formula based on SCr of 1.01 mg/dL). Liver Function Tests: Recent Labs  Lab 12/25/23 1426  AST 22  ALT 29  ALKPHOS 73  BILITOT 1.1  PROT 6.5  ALBUMIN  3.9   Recent Labs  Lab 12/25/23 1426  LIPASE  44   No results for input(s): AMMONIA in the last 168 hours. Coagulation Profile: No results for input(s): INR, PROTIME in the last 168 hours. Cardiac Enzymes: No results for input(s): CKTOTAL, CKMB, CKMBINDEX, TROPONINI in the last 168 hours. BNP (last 3 results) No results for input(s): PROBNP in the last 8760 hours. HbA1C: No results for input(s): HGBA1C in the last 72 hours. CBG: Recent Labs  Lab 12/25/23 1345  GLUCAP 254*   Lipid Profile: No results for input(s): CHOL, HDL, LDLCALC, TRIG, CHOLHDL, LDLDIRECT in the last 72 hours. Thyroid  Function Tests: No results for input(s): TSH, T4TOTAL, FREET4, T3FREE, THYROIDAB in the last 72 hours. Anemia Panel: No results for input(s): VITAMINB12, FOLATE, FERRITIN, TIBC, IRON, RETICCTPCT in the last 72 hours. Urine analysis:    Component Value Date/Time   COLORURINE YELLOW 12/25/2023 1347   APPEARANCEUR CLEAR 12/25/2023 1347   APPEARANCEUR Cloudy (A) 05/21/2023 0902   LABSPEC 1.032 (H) 12/25/2023 1347   LABSPEC 1.025 01/31/2020 0920   PHURINE 6.0 12/25/2023 1347   GLUCOSEU >1,000 (A) 12/25/2023 1347   HGBUR SMALL (A) 12/25/2023 1347   BILIRUBINUR NEGATIVE 12/25/2023 1347   BILIRUBINUR Negative 05/21/2023 0902   KETONESUR NEGATIVE 12/25/2023 1347   PROTEINUR 30 (A) 12/25/2023 1347   UROBILINOGEN negative (A) 04/29/2022 0935   UROBILINOGEN 0.2 11/19/2014 1643   NITRITE POSITIVE (A) 12/25/2023 1347   LEUKOCYTESUR MODERATE (A) 12/25/2023 1347   Sepsis Labs: @LABRCNTIP (procalcitonin:4,lacticidven:4) ) Recent Results (from the past 240 hours)  Urine Culture     Status: Abnormal   Collection Time: 12/16/23 10:30 PM   Specimen: Urine, Clean Catch  Result Value Ref Range Status   Specimen Description   Final    URINE, CLEAN CATCH Performed at [facility], [address] [place], [place] [address]    Special Requests   Final  NONE Performed at [facility], [address], [place], [place] [address]    Culture (A)  Final    >=100,000 COLONIES/mL ESCHERICHIA COLI Confirmed Extended Spectrum Beta-Lactamase Producer (ESBL).  In bloodstream infections from ESBL organisms, carbapenems are preferred over piperacillin /tazobactam. They are shown to have a lower risk of mortality.    Report Status 12/19/2023 FINAL  Final   Organism ID, Bacteria ESCHERICHIA COLI (A)  Final      Susceptibility   Escherichia coli - MIC*    AMPICILLIN >=32 RESISTANT Resistant     CEFAZOLIN  (URINE) Value in next row Resistant      >=32 RESISTANTThis is a modified FDA-approved test that has been validated and its performance characteristics determined by the reporting laboratory.  This laboratory is certified under the Clinical Laboratory Improvement Amendments CLIA as qualified to perform high complexity clinical laboratory testing.    CEFEPIME Value in next row Resistant      >=32 RESISTANTThis is a modified FDA-approved test that has been validated and its performance characteristics determined by the reporting laboratory.  This laboratory is certified under the Clinical Laboratory Improvement Amendments CLIA as qualified to perform high complexity clinical laboratory testing.    CEFTRIAXONE Value in next row Resistant      >=32 RESISTANTThis is a modified FDA-approved test that has been validated and its performance characteristics determined by the reporting laboratory.  This laboratory is certified under the Clinical Laboratory Improvement Amendments CLIA as qualified to perform high complexity clinical laboratory testing.    CIPROFLOXACIN  Value in next row Resistant      >=32 RESISTANTThis is a modified FDA-approved test that has been validated and its performance characteristics determined by the reporting laboratory.  This laboratory is certified under the Clinical Laboratory Improvement Amendments CLIA as qualified to perform high complexity  clinical laboratory testing.    GENTAMICIN  Value in next row Sensitive      >=32 RESISTANTThis is a modified FDA-approved test that has been validated and its performance characteristics determined by the reporting laboratory.  This laboratory is certified under the Clinical Laboratory Improvement Amendments CLIA as qualified to perform high complexity clinical laboratory testing.    NITROFURANTOIN  Value in next row Sensitive      >=32 RESISTANTThis is a modified FDA-approved test that has been validated and its performance characteristics determined by the reporting laboratory.  This laboratory is certified under the Clinical Laboratory Improvement Amendments CLIA as qualified to perform high complexity clinical laboratory testing.    TRIMETH /SULFA  Value in next row Sensitive      >=32 RESISTANTThis is a modified FDA-approved test that has been validated and its performance characteristics determined by the reporting laboratory.  This laboratory is certified under the Clinical Laboratory Improvement Amendments CLIA as qualified to perform high complexity clinical laboratory testing.    AMPICILLIN/SULBACTAM Value in next row Resistant      >=32 RESISTANTThis is a modified FDA-approved test that has been validated and its performance characteristics determined by the reporting laboratory.  This laboratory is certified under the Clinical Laboratory Improvement Amendments CLIA as qualified to perform high complexity clinical laboratory testing.    PIP/TAZO Value in next row Sensitive      <=4 SENSITIVEThis is a modified FDA-approved test that has been validated and its performance characteristics determined by the reporting laboratory.  This laboratory is certified under the Clinical Laboratory Improvement Amendments CLIA as qualified to perform high complexity clinical laboratory testing.    MEROPENEM  Value in next row  Sensitive      <=4 SENSITIVEThis is a modified FDA-approved test that has been validated  and its performance characteristics determined by the reporting laboratory.  This laboratory is certified under the Clinical Laboratory Improvement Amendments CLIA as qualified to perform high complexity clinical laboratory testing.    * >=100,000 COLONIES/mL ESCHERICHIA COLI     Radiological Exams on Admission: CT Head Wo Contrast Result Date: 12/25/2023 CLINICAL DATA:  Blurry vision fall EXAM: CT HEAD WITHOUT CONTRAST TECHNIQUE: Contiguous axial images were obtained from the base of the skull through the vertex without intravenous contrast. RADIATION DOSE REDUCTION: This exam was performed according to the departmental dose-optimization program which includes automated exposure control, adjustment of the mA and/or kV according to patient size and/or use of iterative reconstruction technique. COMPARISON:  MRI report 08/22/2000 FINDINGS: Brain: No acute territorial infarction, hemorrhage or intracranial mass. Mild atrophy. Mild white matter hypodensity likely chronic small vessel ischemic change. The ventricles are nonenlarged Vascular: No hyperdense vessels.  Carotid vascular calcification Skull: Normal. Negative for fracture or focal lesion. Sinuses/Orbits: Fluid level right maxillary sinus with mucosal thickening. Moderate mucosal thickening in the ethmoid sinuses Other: None IMPRESSION: 1. No CT evidence for acute intracranial abnormality. 2. Mild atrophy and chronic small vessel ischemic changes of the white matter. 3. Right maxillary sinus disease with fluid level, possible acute sinusitis. Electronically Signed   By: Luke Bun M.D.   On: 12/25/2023 16:03   CT ABDOMEN PELVIS W CONTRAST Result Date: 12/25/2023 CLINICAL DATA:  Abdominal pain EXAM: CT ABDOMEN AND PELVIS WITH CONTRAST TECHNIQUE: Multidetector CT imaging of the abdomen and pelvis was performed using the standard protocol following bolus administration of intravenous contrast. RADIATION DOSE REDUCTION: This exam was performed  according to the departmental dose-optimization program which includes automated exposure control, adjustment of the mA and/or kV according to patient size and/or use of iterative reconstruction technique. CONTRAST:  OMNIPAQUE  IOHEXOL  300 MG/ML  SOLN COMPARISON:  CT 12/16/2023, 05/04/2023, 05/15/2021, 07/31/2020 FINDINGS: Lower chest: Lung bases demonstrate chronic small left pleural effusion with mild rim thickening and adjacent chronic round atelectasis in the left lower lobe. Subpleural blebs in the right lower lobe. Lower sternotomy. Cardiomegaly and partially visualized cardiac pacing leads Hepatobiliary: No focal liver abnormality is seen. No gallstones, gallbladder wall thickening, or biliary dilatation. Pancreas: Unremarkable. No pancreatic ductal dilatation or surrounding inflammatory changes. Spleen: Normal in size without focal abnormality. Adrenals/Urinary Tract: Adrenal glands are normal. Chronic moderate nonspecific perinephric stranding. No hydronephrosis. Prominence of the ureters without obstructing stone. Bladder is unremarkable Stomach/Bowel: Stomach within normal limits. No dilated small bowel. No acute bowel wall thickening. Diverticular disease of the left colon. Vascular/Lymphatic: Aortic atherosclerosis. No enlarged abdominal or pelvic lymph nodes. Reproductive: Prostate is unremarkable. Other: Negative for ascites or free air. Small fat containing left inguinal hernia Musculoskeletal: Chronic compression fracture at T12. Chronic superior endplate deformity at L3. Mild inferior endplate deformity at L2, new compared with April CT. Posterior spinal fusion hardware L4 through upper sacrum with bilateral sacroiliac screws. Retained screws at S1 segment as before. Grade 1 anterolisthesis L5 on S1. Trace retrolisthesis L3 on L4. IMPRESSION: 1. No CT evidence for acute intra-abdominal or pelvic abnormality. 2. Chronic nonspecific perinephric fat stranding. Slight prominence of the ureters but  no hydronephrosis and no obstructing stone 3. Diverticular disease of the left colon without acute inflammatory process. 4. Chronic small left pleural effusion with adjacent chronic round atelectasis in the left lower lobe. 5. Mild inferior endplate deformity at L2,  new compared with lumbar CT from April but short-term stability compared with December 11 exam. 6. Aortic atherosclerosis. Aortic Atherosclerosis (ICD10-I70.0). Electronically Signed   By: Luke Bun M.D.   On: 12/25/2023 16:00     Assessment/Plan   1. UTI due to ESBL-producing E coli - UTI with right flank pain concerning for pyelonephritis, failed outpatient treatment with Bactrim   - Treat with meropenem    2. Chronic HFrEF  - EF was 30-35% on echo from March 2025  - Appears compensated  - Continue Lasix , Coreg , ARB   3. Type II DM  - A1c was 8.6% earlier this month  - Check CBGs and use low-intensity SSI for now   4. CAD  - No anginal symptoms    5. Rheumatoid arthritis  - Recently completed a course of prednisone  with improvement in hip pain; he was prescribed methotrexate but has not started yet  - Continue outpatient follow-up     DVT prophylaxis: Lovenox   Code Status: Full  Level of Care: Level of care: Med-Surg Family Communication: Wife at bedside   Disposition Plan:  Patient is from: Home  Anticipated d/c is to: Home  Anticipated d/c date is: TBD Patient currently: Pending treatment of UTI   Consults called: None  Admission status: Observation     Evalene GORMAN Sprinkles, MD [facility]  12/25/2023, 8:40 PM       [1]  Allergies Allergen Reactions   Niacin Other (See Comments)    Drops BP and severe joint/muscle pain   Statins Other (See Comments)    Severe joint pain and drops BP.  Other Reaction(s): Unknown   Zetia [Ezetimibe] Other (See Comments)    Joint pain and drops BP   "

## 2023-12-25 NOTE — Progress Notes (Signed)
 Pharmacy Antibiotic Note  [name] is a 68 y.o. male admitted on 12/25/2023 with UTI.  Pharmacy has been consulted for meropenem  dosing.  Plan: Meropenem  1g IV q8h Follow up renal function, culture results, and clinical course.      Temp (24hrs), Avg:98.1 F (36.7 C), Min:98 F (36.7 C), Max:98.3 F (36.8 C)  Recent Labs  Lab 12/25/23 1426 12/25/23 1515 12/25/23 1651  WBC 10.2  --   --   CREATININE 1.01  --   --   LATICACIDVEN  --  1.7 1.4    Estimated Creatinine Clearance: 86.3 mL/min (by C-G formula based on SCr of 1.01 mg/dL).    Allergies[1]  Antimicrobials this admission: 12/20 Zosyn  x1 12/20 meropenem  >>>  Dose adjustments this admission:   Microbiology results: 12/20 BCx: 12/20 UCx:   Thank you for allowing pharmacy to be a part of this patients care.  Wanda Hasting PharmD, BCPS [facility] main pharmacy [phone] 12/25/2023 8:38 PM     [1]  Allergies Allergen Reactions   Niacin Other (See Comments)    Drops BP and severe joint/muscle pain   Statins Other (See Comments)    Severe joint pain and drops BP.  Other Reaction(s): Unknown   Zetia [Ezetimibe] Other (See Comments)    Joint pain and drops BP

## 2023-12-25 NOTE — ED Provider Notes (Signed)
 " [place] EMERGENCY DEPARTMENT AT [facility] Provider Note   CSN: [id number] Arrival date & time: 12/25/23  1306     Patient presents with: Flank Pain   KENTLEY BLYDEN is a 68 y.o. male.    Patient presents to the emergency department today for evaluation of ongoing right sided lower back pain.  Patient has a history of back pain, CAD, systolic heart failure with defibrillator placement, hypertension, history of UTI.  Patient was seen in the emergency department on 12/11 for the symptoms.  Patient was diagnosed with UTI.  He had a CT renal protocol study which was negative.  He was started on Keflex , but urine culture grew out ESBL E. coli.  Patient was changed to Bactrim .  Rx sent in on 12/17.  Patient continues to have pain, there really has not been changed with initiation of therapy.  Patient also reports difficulty tolerating the antibiotic.  He states that after he takes it, he develops slurred speech.  Wife at bedside states that it is very hard to understand him at this time. Patient otherwise denies signs of stroke including: facial droop, aphasia, weakness/numbness in extremities, imbalance/trouble walking.  However he has had blurry vision that has been present since onset of current symptoms.  No loss of vision or double vision.  He has had several falls and has been very unsteady on his feet.  No headache.  No documented fevers.       Prior to Admission medications  Medication Sig Start Date End Date Taking? Authorizing Provider  acetaminophen  (TYLENOL ) 500 MG tablet Take 1,000 mg by mouth 3 (three) times daily. Pt. Reports he is taking half Patient taking differently: Take 1,000 mg by mouth every 6 (six) hours as needed for mild pain (pain score 1-3) or moderate pain (pain score 4-6). Pt. Reports he is taking half    [provider]  aspirin  81 MG EC tablet Take 81 mg by mouth in the morning.    [provider]  carvedilol  (COREG ) 6.25 MG tablet  TAKE 1 TABLET BY MOUTH 2 TIMES DAILY WITH A meal ** must have office visit FOR addtional refills** 09/21/23   Jordan, Peter M, MD  cetirizine (ZYRTEC) 10 MG tablet Take 10 mg by mouth daily as needed for allergies.    [provider]  cyclobenzaprine  (FLEXERIL ) 10 MG tablet Take 1 tablet (10 mg total) by mouth 2 (two) times daily as needed for muscle spasms. 12/16/23   Curatolo, Adam, DO  dapagliflozin propanediol (FARXIGA) 5 MG TABS tablet Take 2.5 mg by mouth daily. 03/12/23   [provider]  DULoxetine  (CYMBALTA ) 20 MG capsule Take 40 mg by mouth daily.    [provider]  folic acid  (FOLVITE ) 1 MG tablet Take 1 mg by mouth daily. 11/10/23 11/09/24  [provider]  furosemide  (LASIX ) 40 MG tablet TAKE 1/2 TABLET BY MOUTH ONCE DAILY 11/26/23   Jordan, Peter M, MD  Glucosamine-Chondroitin (OSTEO BI-FLEX REGULAR STRENGTH PO) Take by mouth 2 (two) times daily.    [provider]  methylPREDNISolone  (MEDROL  DOSEPAK) 4 MG TBPK tablet Follow package insert 12/16/23   Curatolo, Adam, DO  Misc Natural Products (DEEP SLEEP) CAPS Take 2 capsules by mouth at bedtime.    [provider]  Misc Natural Products (GLUCOSAMINE CHOND MSM FORMULA PO) Take 1 tablet by mouth in the morning and at bedtime.    [provider]  Multiple Vitamins-Minerals (AIRBORNE GUMMIES PO) Take 3 each by mouth  2 (two) times daily.    [provider]  Multiple Vitamins-Minerals (MULTIVITAMIN WITH MINERALS) tablet Take 1 tablet by mouth daily. Mega men multivitamin pak    [provider]  nitroGLYCERIN  (NITROSTAT ) 0.4 MG SL tablet Place 1 tablet (0.4 mg total) under the tongue every 5 (five) minutes x 3 doses as needed for chest pain. 09/03/23   Jordan, Peter M, MD  ondansetron  (ZOFRAN -ODT) 8 MG disintegrating tablet Take 8 mg by mouth every 8 (eight) hours as needed for nausea. 05/12/16   [provider]  oxyCODONE  (ROXICODONE ) 5 MG immediate release  tablet Take 1 tablet (5 mg total) by mouth every 6 (six) hours as needed for up to 10 doses. 12/16/23   Curatolo, Adam, DO  pantoprazole  (PROTONIX ) 40 MG tablet TAKE 1 TABLET BY MOUTH EVERY DAY 02/26/23   Sherlynn Madden, MD  predniSONE  (DELTASONE ) 5 MG tablet Take 5 mg by mouth daily with breakfast. 11/10/23   [provider]  REPATHA  SURECLICK 140 MG/ML SOAJ inject into THE SKIN EVERY 14 DAYS 12/20/23   Jordan, Peter M, MD  sulfamethoxazole -trimethoprim  (BACTRIM  DS) 800-160 MG tablet Take 1 tablet by mouth 2 (two) times daily for 7 days. 12/22/23 12/29/23  Doretha Folks, MD  valsartan  (DIOVAN ) 40 MG tablet TAKE 1 TABLET BY MOUTH ONCE DAILY 10/21/23   Emelia Josefa HERO, NP  zolpidem  (AMBIEN ) 10 MG tablet TAKE 1 TABLET BY MOUTH NIGHTLY AT BEDTIME 02/22/23   Sherlynn Madden, MD    Allergies: Niacin, Statins, and Zetia [ezetimibe]    Review of Systems  Updated Vital Signs BP 94/77 (BP Location: Right Arm)   Pulse 96   Temp 98 F (36.7 C)   SpO2 94%   Physical Exam Vitals and nursing note reviewed.  Constitutional:      General: He is not in acute distress.    Appearance: He is well-developed.  HENT:     Head: Normocephalic and atraumatic.     Right Ear: Tympanic membrane, ear canal and external ear normal.     Left Ear: Tympanic membrane, ear canal and external ear normal.     Nose: Nose normal.     Mouth/Throat:     Pharynx: Uvula midline.  Eyes:     General: Lids are normal.        Right eye: No discharge.        Left eye: No discharge.     Conjunctiva/sclera: Conjunctivae normal.     Pupils: Pupils are equal, round, and reactive to light.  Cardiovascular:     Rate and Rhythm: Normal rate and regular rhythm.     Heart sounds: Normal heart sounds.  Pulmonary:     Effort: Pulmonary effort is normal.     Breath sounds: Normal breath sounds.  Abdominal:     Palpations: Abdomen is soft.     Tenderness: There is no abdominal tenderness. There is no right  CVA tenderness, left CVA tenderness, guarding or rebound.  Musculoskeletal:        General: Normal range of motion.     Cervical back: Normal range of motion and neck supple. No tenderness or bony tenderness.     Thoracic back: No tenderness or bony tenderness.     Lumbar back: Tenderness present. No bony tenderness.       Back:  Skin:    General: Skin is warm and dry.  Neurological:     Mental Status: He is alert and oriented to person, place, and time.  GCS: GCS eye subscore is 4. GCS verbal subscore is 5. GCS motor subscore is 6.     Cranial Nerves: No cranial nerve deficit.     Sensory: No sensory deficit.     Motor: No abnormal muscle tone.     Coordination: Coordination normal.     (all labs ordered are listed, but only abnormal results are displayed) Labs Reviewed  URINALYSIS, ROUTINE W REFLEX MICROSCOPIC - Abnormal; Notable for the following components:      Result Value   Specific Gravity, Urine 1.032 (*)    Glucose, UA >1,000 (*)    Hgb urine dipstick SMALL (*)    Protein, ur 30 (*)    Nitrite POSITIVE (*)    Leukocytes,Ua MODERATE (*)    Bacteria, UA MANY (*)    All other components within normal limits  CBC WITH DIFFERENTIAL/PLATELET - Abnormal; Notable for the following components:   Neutro Abs 8.3 (*)    Abs Immature Granulocytes 0.20 (*)    All other components within normal limits  COMPREHENSIVE METABOLIC PANEL WITH GFR - Abnormal; Notable for the following components:   Glucose, Bld 268 (*)    All other components within normal limits  CBG MONITORING, ED - Abnormal; Notable for the following components:   Glucose-Capillary 254 (*)    All other components within normal limits  URINE CULTURE  CULTURE, BLOOD (ROUTINE X 2)  CULTURE, BLOOD (ROUTINE X 2)  LIPASE, BLOOD  LACTIC ACID, PLASMA  LACTIC ACID, PLASMA    EKG: None  Radiology: CT Head Wo Contrast Result Date: 12/25/2023 CLINICAL DATA:  Blurry vision fall EXAM: CT HEAD WITHOUT CONTRAST  TECHNIQUE: Contiguous axial images were obtained from the base of the skull through the vertex without intravenous contrast. RADIATION DOSE REDUCTION: This exam was performed according to the departmental dose-optimization program which includes automated exposure control, adjustment of the mA and/or kV according to patient size and/or use of iterative reconstruction technique. COMPARISON:  MRI report 08/22/2000 FINDINGS: Brain: No acute territorial infarction, hemorrhage or intracranial mass. Mild atrophy. Mild white matter hypodensity likely chronic small vessel ischemic change. The ventricles are nonenlarged Vascular: No hyperdense vessels.  Carotid vascular calcification Skull: Normal. Negative for fracture or focal lesion. Sinuses/Orbits: Fluid level right maxillary sinus with mucosal thickening. Moderate mucosal thickening in the ethmoid sinuses Other: None IMPRESSION: 1. No CT evidence for acute intracranial abnormality. 2. Mild atrophy and chronic small vessel ischemic changes of the white matter. 3. Right maxillary sinus disease with fluid level, possible acute sinusitis. Electronically Signed   By: Luke Bun M.D.   On: 12/25/2023 16:03   CT ABDOMEN PELVIS W CONTRAST Result Date: 12/25/2023 CLINICAL DATA:  Abdominal pain EXAM: CT ABDOMEN AND PELVIS WITH CONTRAST TECHNIQUE: Multidetector CT imaging of the abdomen and pelvis was performed using the standard protocol following bolus administration of intravenous contrast. RADIATION DOSE REDUCTION: This exam was performed according to the departmental dose-optimization program which includes automated exposure control, adjustment of the mA and/or kV according to patient size and/or use of iterative reconstruction technique. CONTRAST:  OMNIPAQUE  IOHEXOL  300 MG/ML  SOLN COMPARISON:  CT 12/16/2023, 05/04/2023, 05/15/2021, 07/31/2020 FINDINGS: Lower chest: Lung bases demonstrate chronic small left pleural effusion with mild rim thickening and adjacent  chronic round atelectasis in the left lower lobe. Subpleural blebs in the right lower lobe. Lower sternotomy. Cardiomegaly and partially visualized cardiac pacing leads Hepatobiliary: No focal liver abnormality is seen. No gallstones, gallbladder wall thickening, or biliary dilatation. Pancreas: Unremarkable. No  pancreatic ductal dilatation or surrounding inflammatory changes. Spleen: Normal in size without focal abnormality. Adrenals/Urinary Tract: Adrenal glands are normal. Chronic moderate nonspecific perinephric stranding. No hydronephrosis. Prominence of the ureters without obstructing stone. Bladder is unremarkable Stomach/Bowel: Stomach within normal limits. No dilated small bowel. No acute bowel wall thickening. Diverticular disease of the left colon. Vascular/Lymphatic: Aortic atherosclerosis. No enlarged abdominal or pelvic lymph nodes. Reproductive: Prostate is unremarkable. Other: Negative for ascites or free air. Small fat containing left inguinal hernia Musculoskeletal: Chronic compression fracture at T12. Chronic superior endplate deformity at L3. Mild inferior endplate deformity at L2, new compared with April CT. Posterior spinal fusion hardware L4 through upper sacrum with bilateral sacroiliac screws. Retained screws at S1 segment as before. Grade 1 anterolisthesis L5 on S1. Trace retrolisthesis L3 on L4. IMPRESSION: 1. No CT evidence for acute intra-abdominal or pelvic abnormality. 2. Chronic nonspecific perinephric fat stranding. Slight prominence of the ureters but no hydronephrosis and no obstructing stone 3. Diverticular disease of the left colon without acute inflammatory process. 4. Chronic small left pleural effusion with adjacent chronic round atelectasis in the left lower lobe. 5. Mild inferior endplate deformity at L2, new compared with lumbar CT from April but short-term stability compared with December 11 exam. 6. Aortic atherosclerosis. Aortic Atherosclerosis (ICD10-I70.0).  Electronically Signed   By: Luke Bun M.D.   On: 12/25/2023 16:00     Procedures   Medications Ordered in the ED - No data to display  ED Course  Patient seen and examined. History obtained directly from patient.  Reviewed previous ED notes, imaging, culture reports.  Labs/EKG: Ordered CBC, CMP, lipase, UA. Lactic acid added.   Imaging: Ordered CT abd/pelvis with contrast to evaluate for any renal abnormalities/abscess, CT head.  Medications/Fluids: Ordered: IV fluids.   Most recent vital signs reviewed and are as follows: BP 130/75   Pulse 90   Temp 98 F (36.7 C)   Resp 18   SpO2 93%   Initial impression: Back pain, ongoing UTI, failure of outpatient antibiotics.  4:41 PM Reassessment performed. Patient appears stable.  Labs personally reviewed and interpreted including: CBC with normal white blood cell count, mildly elevated neutrophils; CMP glucose 268, normal anion gap; lipase normal; UA suggestive of infection, clean-catch, positive nitrite, greater than 50 white blood cells; lactate normal at 1.7.  Imaging personally visualized and interpreted including: CT head, acute sinusitis, no obvious stroke; CT abdomen pelvis, shows chronic nonspecific perinephric fat standing without abscess.   Reviewed pertinent lab work and imaging with patient at bedside. Questions answered.  We discussed possibility of admission, given incompletely treated urine infection, possible pyelonephritis.  Patient uncertain if he wants that.  Most current vital signs reviewed and are as follows: BP 130/75   Pulse 90   Temp 98 F (36.7 C)   Resp 18   SpO2 93%   Plan: Will give a dose of Zosyn , send blood cultures. Discussed with Dr. Dreama who will see.   5:58 PM Reassessment performed. Patient appears stable.  Now willing to be admitted.  Will discuss with hospitalist.  Most current vital signs reviewed and are as follows: BP 136/79   Pulse 87   Temp 98 F (36.7 C)   Resp 15    SpO2 94%   Plan: Admit to hospital.   Patient discussed with: Dr. Roxane with [facility].  Medical Decision Making Amount and/or Complexity of Data Reviewed Labs: ordered. Radiology: ordered.  Risk Prescription drug management. Decision regarding hospitalization.   Patient with back pain, ongoing ESBL UTI.  CT with some nonspecific perirenal stranding, no abscess.  Concern for pyelonephritis given ongoing symptoms.  Has been on Bactrim , not tolerated well.  Will plan for admission for IV antibiotics.  Patient also complains of some slurred speech and lightheadedness, mainly related to Bactrim  use.  Nonfocal neuroexam.  There is reports of blurry vision.  Head CT was negative except for acute sinusitis.  Low concern for CVA/TIA at this time.      Final diagnoses:  Acute pyelonephritis  ESBL (extended spectrum beta-lactamase) producing bacteria infection    ED Discharge Orders     None          Desiderio Chew, PA-C 12/25/23 1800  "

## 2023-12-25 NOTE — ED Notes (Signed)
 [facility] at bedside

## 2023-12-25 NOTE — ED Triage Notes (Signed)
 He states he was seen here~ 10 days ago, at which time he was dx with UTI. He c/o persistent flank area discomfort R > L and persistent uri sx. He is ambulatory and in no distress.

## 2023-12-26 ENCOUNTER — Encounter (HOSPITAL_COMMUNITY): Payer: Self-pay | Admitting: Internal Medicine

## 2023-12-26 DIAGNOSIS — M549 Dorsalgia, unspecified: Secondary | ICD-10-CM | POA: Diagnosis present

## 2023-12-26 DIAGNOSIS — F419 Anxiety disorder, unspecified: Secondary | ICD-10-CM | POA: Diagnosis present

## 2023-12-26 DIAGNOSIS — G8929 Other chronic pain: Secondary | ICD-10-CM | POA: Diagnosis present

## 2023-12-26 DIAGNOSIS — M0579 Rheumatoid arthritis with rheumatoid factor of multiple sites without organ or systems involvement: Secondary | ICD-10-CM | POA: Diagnosis present

## 2023-12-26 DIAGNOSIS — Z888 Allergy status to other drugs, medicaments and biological substances status: Secondary | ICD-10-CM | POA: Diagnosis not present

## 2023-12-26 DIAGNOSIS — Z87440 Personal history of urinary (tract) infections: Secondary | ICD-10-CM | POA: Diagnosis not present

## 2023-12-26 DIAGNOSIS — I2510 Atherosclerotic heart disease of native coronary artery without angina pectoris: Secondary | ICD-10-CM | POA: Diagnosis present

## 2023-12-26 DIAGNOSIS — Z87891 Personal history of nicotine dependence: Secondary | ICD-10-CM | POA: Diagnosis not present

## 2023-12-26 DIAGNOSIS — A499 Bacterial infection, unspecified: Secondary | ICD-10-CM | POA: Diagnosis present

## 2023-12-26 DIAGNOSIS — B9620 Unspecified Escherichia coli [E. coli] as the cause of diseases classified elsewhere: Secondary | ICD-10-CM | POA: Diagnosis present

## 2023-12-26 DIAGNOSIS — Z955 Presence of coronary angioplasty implant and graft: Secondary | ICD-10-CM | POA: Diagnosis not present

## 2023-12-26 DIAGNOSIS — I110 Hypertensive heart disease with heart failure: Secondary | ICD-10-CM | POA: Diagnosis present

## 2023-12-26 DIAGNOSIS — Z79899 Other long term (current) drug therapy: Secondary | ICD-10-CM | POA: Diagnosis not present

## 2023-12-26 DIAGNOSIS — Z96651 Presence of right artificial knee joint: Secondary | ICD-10-CM | POA: Diagnosis present

## 2023-12-26 DIAGNOSIS — Z1612 Extended spectrum beta lactamase (ESBL) resistance: Secondary | ICD-10-CM | POA: Diagnosis present

## 2023-12-26 DIAGNOSIS — B9629 Other Escherichia coli [E. coli] as the cause of diseases classified elsewhere: Secondary | ICD-10-CM | POA: Diagnosis not present

## 2023-12-26 DIAGNOSIS — Z951 Presence of aortocoronary bypass graft: Secondary | ICD-10-CM | POA: Diagnosis not present

## 2023-12-26 DIAGNOSIS — Z8249 Family history of ischemic heart disease and other diseases of the circulatory system: Secondary | ICD-10-CM | POA: Diagnosis not present

## 2023-12-26 DIAGNOSIS — Z7982 Long term (current) use of aspirin: Secondary | ICD-10-CM | POA: Diagnosis not present

## 2023-12-26 DIAGNOSIS — R369 Urethral discharge, unspecified: Secondary | ICD-10-CM | POA: Diagnosis not present

## 2023-12-26 DIAGNOSIS — J019 Acute sinusitis, unspecified: Secondary | ICD-10-CM | POA: Diagnosis present

## 2023-12-26 DIAGNOSIS — N390 Urinary tract infection, site not specified: Secondary | ICD-10-CM | POA: Diagnosis present

## 2023-12-26 DIAGNOSIS — E7800 Pure hypercholesterolemia, unspecified: Secondary | ICD-10-CM | POA: Diagnosis present

## 2023-12-26 DIAGNOSIS — I5022 Chronic systolic (congestive) heart failure: Secondary | ICD-10-CM | POA: Diagnosis present

## 2023-12-26 DIAGNOSIS — E119 Type 2 diabetes mellitus without complications: Secondary | ICD-10-CM | POA: Diagnosis present

## 2023-12-26 DIAGNOSIS — Z833 Family history of diabetes mellitus: Secondary | ICD-10-CM | POA: Diagnosis not present

## 2023-12-26 DIAGNOSIS — Z95810 Presence of automatic (implantable) cardiac defibrillator: Secondary | ICD-10-CM | POA: Diagnosis not present

## 2023-12-26 LAB — GLUCOSE, CAPILLARY
Glucose-Capillary: 159 mg/dL — ABNORMAL HIGH (ref 70–99)
Glucose-Capillary: 248 mg/dL — ABNORMAL HIGH (ref 70–99)
Glucose-Capillary: 240 mg/dL — ABNORMAL HIGH (ref 70–99)

## 2023-12-26 LAB — BASIC METABOLIC PANEL WITH GFR
Anion gap: 7 (ref 5–15)
BUN: 17 mg/dL (ref 8–23)
CO2: 24 mmol/L (ref 22–32)
Calcium: 8.5 mg/dL — ABNORMAL LOW (ref 8.9–10.3)
Chloride: 106 mmol/L (ref 98–111)
Creatinine, Ser: 0.69 mg/dL (ref 0.61–1.24)
GFR, Estimated: >60 mL/min
Glucose, Bld: 171 mg/dL — ABNORMAL HIGH (ref 70–99)
Potassium: 3.9 mmol/L (ref 3.5–5.1)
Sodium: 137 mmol/L (ref 135–145)

## 2023-12-26 LAB — CBC
HCT: 41.3 % (ref 39.0–52.0)
Hemoglobin: 13.9 g/dL (ref 13.0–17.0)
MCH: 32.8 pg (ref 26.0–34.0)
MCHC: 33.7 g/dL (ref 30.0–36.0)
MCV: 97.4 fL (ref 80.0–100.0)
Platelets: 145 K/uL — ABNORMAL LOW (ref 150–400)
RBC: 4.24 MIL/uL (ref 4.22–5.81)
RDW: 13.9 % (ref 11.5–15.5)
WBC: 6.8 K/uL (ref 4.0–10.5)
nRBC: 0 % (ref 0.0–0.2)

## 2023-12-26 LAB — HIV ANTIBODY (ROUTINE TESTING W REFLEX): HIV Screen 4th Generation wRfx: NONREACTIVE

## 2023-12-26 MED ORDER — DULOXETINE HCL 40 MG PO CPEP: 40.0000 mg | ORAL_CAPSULE | Freq: Every day | ORAL | 0 refills | Status: DC

## 2023-12-26 MED ORDER — NITROGLYCERIN 0.4 MG SL SUBL
0.4000 mg | SUBLINGUAL_TABLET | SUBLINGUAL | Status: DC | PRN
  Filled 2023-12-26: qty 25

## 2023-12-26 MED ORDER — ZOLPIDEM TARTRATE 5 MG PO TABS: 10.0000 mg | ORAL_TABLET | Freq: Every evening | ORAL | Status: DC | PRN

## 2023-12-26 MED ORDER — ENOXAPARIN SODIUM 40 MG/0.4ML IJ SOSY
40.0000 mg | PREFILLED_SYRINGE | INTRAMUSCULAR | Status: DC
  Filled 2023-12-26: qty 0.4

## 2023-12-26 MED ORDER — ZOLPIDEM TARTRATE 10 MG PO TABS: 10.0000 mg | ORAL_TABLET | Freq: Every day | ORAL | 0 refills | Status: DC

## 2023-12-26 MED ORDER — ASPIRIN 81 MG PO TBEC
81.0000 mg | DELAYED_RELEASE_TABLET | Freq: Every morning | ORAL | Status: DC
  Administered 2023-12-26 – 2023-12-30 (×5): 81 mg via ORAL
  Filled 2023-12-26 (×7): qty 1

## 2023-12-26 MED ORDER — PREGABALIN 100 MG PO CAPS
100.0000 mg | ORAL_CAPSULE | Freq: Every evening | ORAL | Status: DC
  Administered 2023-12-26 – 2023-12-29 (×4): 100 mg via ORAL
  Filled 2023-12-26: qty 2
  Filled 2023-12-26 (×8): qty 1

## 2023-12-26 MED ORDER — CARVEDILOL 3.125 MG PO TABS
3.1250 mg | ORAL_TABLET | Freq: Two times a day (BID) | ORAL | Status: DC
  Administered 2023-12-27: 3.125 mg via ORAL
  Filled 2023-12-26 (×3): qty 1

## 2023-12-26 MED ORDER — IRBESARTAN 75 MG PO TABS
37.5000 mg | ORAL_TABLET | Freq: Every day | ORAL | Status: DC
  Filled 2023-12-26 (×3): qty 0.5

## 2023-12-26 MED ORDER — ZOLPIDEM TARTRATE 5 MG PO TABS
10.0000 mg | ORAL_TABLET | Freq: Every day | ORAL | Status: DC
  Administered 2023-12-26 – 2023-12-29 (×4): 10 mg via ORAL
  Filled 2023-12-26 (×5): qty 2
  Filled 2023-12-26 (×2): qty 1
  Filled 2023-12-26 (×5): qty 2

## 2023-12-26 MED ORDER — SODIUM CHLORIDE 0.9 % IV SOLN
1.0000 g | INTRAVENOUS | Status: DC
  Administered 2023-12-26 – 2023-12-29 (×4): 1 g via INTRAVENOUS
  Filled 2023-12-26 (×7): qty 1000

## 2023-12-26 NOTE — Progress Notes (Signed)
 This writer is at patients bedside doing admission questions. Patient is A&O x4, was able to tell me his address, phone number, emergency contacts, allergies. Patient has no pets, he does have firearms that are secured in the home. Patient is fully ambulatory, has been walking up and down the hallway with no problems. Patient states he has multi level house, 2 steps to get into the house. Patient feels he is able to do it with no problems. Patient has no problems and is ready to go home.---------------------------------------------------- Nat SQUIBB. Franchot, EMT

## 2023-12-26 NOTE — Progress Notes (Signed)
 This Chief Financial Officer, [name] went to [facility], [address] nurses station, spoke with Nurse, Kaitlyn about patient. Kaitlyn gave report that the patient had refused his Carvedilol  with her, saying the dosage was not correct and that he was in a bad mood because he had to wait to be transported home so long. This writer went to room 1502, patient was sitting in recliner, fully dressed, watching tv. This clinical research associate asked the patient if he had all of his belongings, he stated, yes. The patient sat in this writers wheelchair. This clinical research associate pushed patient to the wheelchair fleeta, loaded patient, secured him via 2 front floor hooks, 2 rear floor hooks, 2 wheelchair locks and lap/shoulder seatbelt. The patient asked if he had to ride in the wheelchair to go home, this writer explained to the patient it was protocol for the program. Patent was transported to his residence. The patients wife met us  in the driveway, showed us  how to get into the house. The patient was unloaded, patient walked into his house with no problems. The team set up his [facility] equipment. The patient stated he did not sleep last night due to not getting the correct dose of his Ambien . This clinical research associate went over patients home medications with the Acupuncturist, Shanda. This clinical research associate took patients vital signs, in the vital tab in EPIC. Patient had bruising to top of his left and and on his left arm (ac area), pictures where taken and uploaded into EPIC. Also, a picture of patient Valsartan  was uploaded into EPIC so it could be approved to take from his own private stock. Patient was educated on how to operate the tablet, how to call into the Virtual nurses if he had questions or concerns. The Virtual Nurse, Shanda called the patient, reiterated the program guidelines, how to call if he needed to and to tell him another staff member would be out later to bring the rest of his medication. Patient stated that was fine, he wanted to take a shower  and go to bed. The patient requested that our program Dr call his cardiologist, Peter jordan to go over his medication list with anything new. This message was passed onto the Acupuncturist, Shanda. This Chief Financial Officer, [name] left the patients residence. ------------------------- [name]. [name], EMT

## 2023-12-26 NOTE — Progress Notes (Signed)
 "  [facility] PROGRESS NOTE   [name] FMW:[id number] DOB: 01-Sep-1955 DOA: 12/25/2023  PCP: Emerick Avelina POUR, PA-C  Brief History: 68 y.o. male with medical history significant for CAD status post CABG, chronic HFrEF with ICD, rheumatoid arthritis, and UTI due to ESBL producing E. coli who presents with ongoing right flank pain, fatigue, and mild dysuria despite treatment with Bactrim . Patient was seen in the ED on 12/16/2023 with right flank pain and mild dysuria.  He was prescribed Keflex  at that time but urine culture grew >100k cfu/mL ESBL producing E. coli that was sensitive to Bactrim .  He has mild chest discomfort ever since his ICD fired a few days ago but no anginal complaints; this shock was felt to be in appropriate and his ICD was reprogrammed on 12/20/2023.   Consultants: None  Procedures: None   Subjective/Interval History: Patient mentions that he has back pain but improved from yesterday.  Denies any nausea vomiting.  No chest pain or shortness of breath this morning.    Assessment/Plan:  UTI due to ESBL producing E. coli Patient with right flank pain concerning for pyelonephritis however CT scan did not show any acute findings.  Nonspecific perinephric fat stranding was noted but this was thought to be chronic. Patient with ESBL E. coli on recent urine cultures.  Unfortunately did not improve with Bactrim . Currently on ertapenem . Patient mentioned that he has UTIs almost once a year every year.  He has followed by urology and last seen about a year ago.  He underwent cystoscopy about 5 years ago.  Urinary bladder was noted to be unremarkable on CT scan. May benefit from urology follow-up after he is treated for his current infection. Reason for his frequent UTIs not entirely clear.  Could be immunosuppressed considering his history of rheumatoid arthritis. He will need at the very least 5 days of ertapenem .    Chronic systolic CHF EF was 30 to 35% based  on echocardiogram from March 2025.  He is well compensated.  He is on furosemide  carvedilol  ARB.  Blood pressure is noted to be elevated this morning.  Renal function is normal.  Potassium is 3.9.  Diabetes mellitus type 2 HbA1c was 8.6 earlier this month.  On SSI.  Monitor CBGs.  History of coronary artery disease Stable.  Rheumatoid arthritis It looks like he was recently diagnosed with rheumatoid arthritis.  He was on prednisone  recently but he has completed the course.  Has not yet started taking methotrexate.   DVT Prophylaxis: Initiate Lovenox  Code Status: Full code Family Communication: Discussed with patient Disposition Plan: Home when improved.  Status is: Inpatient Remains inpatient appropriate because: Need for IV antibiotics    Medications: Scheduled:  aspirin  EC  81 mg Oral q AM   carvedilol   6.25 mg Oral BID WC   DULoxetine   40 mg Oral Daily   furosemide   20 mg Oral Daily   pantoprazole   40 mg Oral Daily   pregabalin   100 mg Oral QPM   zolpidem   10 mg Oral QHS   Continuous:  ertapenem      PRN:acetaminophen  **OR** [DISCONTINUED] acetaminophen , nitroGLYCERIN , polyethylene glycol  Antibiotics: Anti-infectives (From admission, onward)    Start     Dose/Rate Route Frequency Ordered Stop   12/26/23 1200  ertapenem  (INVANZ ) 1 g in sodium chloride  0.9 % 100 mL IVPB        1 g 200 mL/hr over 30 Minutes Intravenous Every 24 hours 12/26/23 0951     12/25/23  2200  meropenem  (MERREM ) 1 g in sodium chloride  0.9 % 100 mL IVPB  Status:  Discontinued        1 g 200 mL/hr over 30 Minutes Intravenous Every 8 hours 12/25/23 2039 12/26/23 0951   12/25/23 1645  piperacillin -tazobactam (ZOSYN ) IVPB 3.375 g  Status:  Discontinued        3.375 g 12.5 mL/hr over 240 Minutes Intravenous  Once 12/25/23 1630 12/26/23 0900       Objective:  Vital Signs  Vitals:   12/25/23 2355 12/26/23 0243 12/26/23 0325 12/26/23 0944  BP: 116/74 (!) 165/83 (!) 158/89 (!) 173/89  Pulse: 79  72 81 96  Resp: 18 20 18    Temp: 97.9 F (36.6 C) 98.5 F (36.9 C) 98 F (36.7 C) 98.2 F (36.8 C)  TempSrc: Oral Oral Oral Oral  SpO2: 94% 97% 96% 96%    Intake/Output Summary (Last 24 hours) at 12/26/2023 1102 Last data filed at 12/26/2023 0825 Gross per 24 hour  Intake 360 ml  Output --  Net 360 ml    General appearance: Awake alert.  In no distress Resp: Clear to auscultation bilaterally.  Normal effort Cardio: S1-S2 is normal regular.  No S3-S4.  No rubs murmurs or bruit GI: Abdomen is soft.  Nontender nondistended.  Bowel sounds are present normal.  No masses organomegaly Extremities: No edema.  Full range of motion of lower extremities. Neurologic: Alert and oriented x3.  No focal neurological deficits.    Lab Results:  Data Reviewed: I have personally reviewed following labs and reports of the imaging studies  CBC: Recent Labs  Lab 12/25/23 1426 12/26/23 0531  WBC 10.2 6.8  NEUTROABS 8.3*  --   HGB 16.0 13.9  HCT 48.1 41.3  MCV 99.2 97.4  PLT 172 145*    Basic Metabolic Panel: Recent Labs  Lab 12/25/23 1426 12/26/23 0531  NA 137 137  K 4.2 3.9  CL 101 106  CO2 27 24  GLUCOSE 268* 171*  BUN 17 17  CREATININE 1.01 0.69  CALCIUM 9.4 8.5*    GFR: Estimated Creatinine Clearance: 109 mL/min (by C-G formula based on SCr of 0.69 mg/dL).  Liver Function Tests: Recent Labs  Lab 12/25/23 1426  AST 22  ALT 29  ALKPHOS 73  BILITOT 1.1  PROT 6.5  ALBUMIN  3.9    Recent Labs  Lab 12/25/23 1426  LIPASE 44    CBG: Recent Labs  Lab 12/25/23 1345 12/25/23 2222 12/26/23 0802  GLUCAP 254* 268* 159*     Recent Results (from the past 240 hours)  Urine Culture     Status: Abnormal   Collection Time: 12/16/23 10:30 PM   Specimen: Urine, Clean Catch  Result Value Ref Range Status   Specimen Description   Final    URINE, CLEAN CATCH Performed at [facility], [address], [place], [place] [address]    Special  Requests   Final    NONE Performed at [facility], [address], [place], [place] [address]    Culture (A)  Final    >=100,000 COLONIES/mL ESCHERICHIA COLI Confirmed Extended Spectrum Beta-Lactamase Producer (ESBL).  In bloodstream infections from ESBL organisms, carbapenems are preferred over piperacillin /tazobactam. They are shown to have a lower risk of mortality.    Report Status 12/19/2023 FINAL  Final   Organism ID, Bacteria ESCHERICHIA COLI (A)  Final      Susceptibility   Escherichia coli - MIC*    AMPICILLIN >=32 RESISTANT Resistant  CEFAZOLIN  (URINE) Value in next row Resistant      >=32 RESISTANTThis is a modified FDA-approved test that has been validated and its performance characteristics determined by the reporting laboratory.  This laboratory is certified under the Clinical Laboratory Improvement Amendments CLIA as qualified to perform high complexity clinical laboratory testing.    CEFEPIME Value in next row Resistant      >=32 RESISTANTThis is a modified FDA-approved test that has been validated and its performance characteristics determined by the reporting laboratory.  This laboratory is certified under the Clinical Laboratory Improvement Amendments CLIA as qualified to perform high complexity clinical laboratory testing.    CEFTRIAXONE Value in next row Resistant      >=32 RESISTANTThis is a modified FDA-approved test that has been validated and its performance characteristics determined by the reporting laboratory.  This laboratory is certified under the Clinical Laboratory Improvement Amendments CLIA as qualified to perform high complexity clinical laboratory testing.    CIPROFLOXACIN  Value in next row Resistant      >=32 RESISTANTThis is a modified FDA-approved test that has been validated and its performance characteristics determined by the reporting laboratory.  This laboratory is certified under the Clinical Laboratory Improvement Amendments  CLIA as qualified to perform high complexity clinical laboratory testing.    GENTAMICIN  Value in next row Sensitive      >=32 RESISTANTThis is a modified FDA-approved test that has been validated and its performance characteristics determined by the reporting laboratory.  This laboratory is certified under the Clinical Laboratory Improvement Amendments CLIA as qualified to perform high complexity clinical laboratory testing.    NITROFURANTOIN  Value in next row Sensitive      >=32 RESISTANTThis is a modified FDA-approved test that has been validated and its performance characteristics determined by the reporting laboratory.  This laboratory is certified under the Clinical Laboratory Improvement Amendments CLIA as qualified to perform high complexity clinical laboratory testing.    TRIMETH /SULFA  Value in next row Sensitive      >=32 RESISTANTThis is a modified FDA-approved test that has been validated and its performance characteristics determined by the reporting laboratory.  This laboratory is certified under the Clinical Laboratory Improvement Amendments CLIA as qualified to perform high complexity clinical laboratory testing.    AMPICILLIN/SULBACTAM Value in next row Resistant      >=32 RESISTANTThis is a modified FDA-approved test that has been validated and its performance characteristics determined by the reporting laboratory.  This laboratory is certified under the Clinical Laboratory Improvement Amendments CLIA as qualified to perform high complexity clinical laboratory testing.    PIP/TAZO Value in next row Sensitive      <=4 SENSITIVEThis is a modified FDA-approved test that has been validated and its performance characteristics determined by the reporting laboratory.  This laboratory is certified under the Clinical Laboratory Improvement Amendments CLIA as qualified to perform high complexity clinical laboratory testing.    MEROPENEM  Value in next row Sensitive      <=4 SENSITIVEThis is a  modified FDA-approved test that has been validated and its performance characteristics determined by the reporting laboratory.  This laboratory is certified under the Clinical Laboratory Improvement Amendments CLIA as qualified to perform high complexity clinical laboratory testing.    * >=100,000 COLONIES/mL ESCHERICHIA COLI      Radiology Studies: CT Head Wo Contrast Result Date: 12/25/2023 CLINICAL DATA:  Blurry vision fall EXAM: CT HEAD WITHOUT CONTRAST TECHNIQUE: Contiguous axial images were obtained from the base of the skull through the vertex without intravenous  contrast. RADIATION DOSE REDUCTION: This exam was performed according to the departmental dose-optimization program which includes automated exposure control, adjustment of the mA and/or kV according to patient size and/or use of iterative reconstruction technique. COMPARISON:  MRI report 08/22/2000 FINDINGS: Brain: No acute territorial infarction, hemorrhage or intracranial mass. Mild atrophy. Mild white matter hypodensity likely chronic small vessel ischemic change. The ventricles are nonenlarged Vascular: No hyperdense vessels.  Carotid vascular calcification Skull: Normal. Negative for fracture or focal lesion. Sinuses/Orbits: Fluid level right maxillary sinus with mucosal thickening. Moderate mucosal thickening in the ethmoid sinuses Other: None IMPRESSION: 1. No CT evidence for acute intracranial abnormality. 2. Mild atrophy and chronic small vessel ischemic changes of the white matter. 3. Right maxillary sinus disease with fluid level, possible acute sinusitis. Electronically Signed   By: Luke Bun M.D.   On: 12/25/2023 16:03   CT ABDOMEN PELVIS W CONTRAST Result Date: 12/25/2023 CLINICAL DATA:  Abdominal pain EXAM: CT ABDOMEN AND PELVIS WITH CONTRAST TECHNIQUE: Multidetector CT imaging of the abdomen and pelvis was performed using the standard protocol following bolus administration of intravenous contrast. RADIATION DOSE  REDUCTION: This exam was performed according to the departmental dose-optimization program which includes automated exposure control, adjustment of the mA and/or kV according to patient size and/or use of iterative reconstruction technique. CONTRAST:  OMNIPAQUE  IOHEXOL  300 MG/ML  SOLN COMPARISON:  CT 12/16/2023, 05/04/2023, 05/15/2021, 07/31/2020 FINDINGS: Lower chest: Lung bases demonstrate chronic small left pleural effusion with mild rim thickening and adjacent chronic round atelectasis in the left lower lobe. Subpleural blebs in the right lower lobe. Lower sternotomy. Cardiomegaly and partially visualized cardiac pacing leads Hepatobiliary: No focal liver abnormality is seen. No gallstones, gallbladder wall thickening, or biliary dilatation. Pancreas: Unremarkable. No pancreatic ductal dilatation or surrounding inflammatory changes. Spleen: Normal in size without focal abnormality. Adrenals/Urinary Tract: Adrenal glands are normal. Chronic moderate nonspecific perinephric stranding. No hydronephrosis. Prominence of the ureters without obstructing stone. Bladder is unremarkable Stomach/Bowel: Stomach within normal limits. No dilated small bowel. No acute bowel wall thickening. Diverticular disease of the left colon. Vascular/Lymphatic: Aortic atherosclerosis. No enlarged abdominal or pelvic lymph nodes. Reproductive: Prostate is unremarkable. Other: Negative for ascites or free air. Small fat containing left inguinal hernia Musculoskeletal: Chronic compression fracture at T12. Chronic superior endplate deformity at L3. Mild inferior endplate deformity at L2, new compared with April CT. Posterior spinal fusion hardware L4 through upper sacrum with bilateral sacroiliac screws. Retained screws at S1 segment as before. Grade 1 anterolisthesis L5 on S1. Trace retrolisthesis L3 on L4. IMPRESSION: 1. No CT evidence for acute intra-abdominal or pelvic abnormality. 2. Chronic nonspecific perinephric fat stranding.  Slight prominence of the ureters but no hydronephrosis and no obstructing stone 3. Diverticular disease of the left colon without acute inflammatory process. 4. Chronic small left pleural effusion with adjacent chronic round atelectasis in the left lower lobe. 5. Mild inferior endplate deformity at L2, new compared with lumbar CT from April but short-term stability compared with December 11 exam. 6. Aortic atherosclerosis. Aortic Atherosclerosis (ICD10-I70.0). Electronically Signed   By: Luke Bun M.D.   On: 12/25/2023 16:00       LOS: 0 days   Justun Anaya Verdene  [facility] Pager on [email or web address]  12/26/2023, 11:02 AM   "

## 2023-12-26 NOTE — Hospital Course (Addendum)
 Mr. Daren Yeagle is a 68 year old male with history of heart failure reduced ejection fraction, status post defibrillator status, hypertension, CAD status post CABG, rheumatoid arthritis, GERD, insomnia, history of ESBL UTI.  12/25/2023: Patient presents to the emergency department for chief concerns of flank pain, right greater than left.  12/25/2023: Patient admitted to hospitalist service for chief concerns of ESBL UTI.  Meropenem  was initiated.  12/26/2023: Day 2 of abx. Patient care transferred to hospital at home service.   12/22: I assumed care of patient. Day 3 of abx. Urine culture positive sensitivity/specificity resulted.  Will continue with ertapenem  given no good p.o. alternative.  Toradol  15 mg IV twice daily as needed for moderate pain, 2 days ordered.  12/23: Day 4 of antibiotic. Patient reports Toradol  did not help with his pain.  Though his pain overall is much better than when he first was in the hospital.  He reports he feels like he still having infection and would like to complete 5-day course of ertapenem .  IV Tylenol  for pain control given as Toradol  reportedly unhelpful.  12/24: Day 5 antibiotic. Pt with ongoing significant flank pain, urinary frequency and urgency.

## 2023-12-26 NOTE — Progress Notes (Incomplete)
 This Emt visited PT for scheduled meds. This EMT found PT sitting in his recliner chair. PT was refused meds: AVAPRO  and COREG  stating they wanted to discuss meds with their cardiovascular physician and [facility] physician. Donor accepted and took med: ambien . Patient vitals and were captured and documented. Donor had no other complaints or concerns.

## 2023-12-26 NOTE — Plan of Care (Signed)
 " Progress Note    [name]   FMW:[id number]  DOB: 12-04-1955  DOA: 12/25/2023     0 Date of Service: 12/26/2023   Brief History: 68 y.o. male with medical history significant for CAD status post CABG, chronic HFrEF with ICD, rheumatoid arthritis, and UTI due to ESBL producing E. coli who presents with ongoing right flank pain, fatigue, and mild dysuria despite treatment with Bactrim . Patient was seen in the ED on 12/16/2023 with right flank pain and mild dysuria.  He was prescribed Keflex  at that time but urine culture grew >100k cfu/mL ESBL producing E. coli that was sensitive to Bactrim .  He has mild chest discomfort ever since his ICD fired a few days ago but no anginal complaints; this shock was felt to be in appropriate and his ICD was reprogrammed on 12/20/2023. No other episodes of shock or chest pain thereafter.  Had discussion with patient and wife (over the phone). They both are in full agreement with the hospital at home program.   Subjective:  Back and flank pain improved. Headache, sinus pressure, blurry vision also improving   Hospital Problems Assessment and Plan: No notes have been filed under this hospital service. Service: Hospitalist UTI due to ESBL producing E. coli Patient with right flank pain concerning for pyelonephritis however CT scan did not show any acute findings.  Nonspecific perinephric fat stranding was noted but this was thought to be chronic. Patient with ESBL E. coli on recent urine cultures.  Unfortunately did not improve with Bactrim . Currently on ertapenem . Patient mentioned that he has UTIs almost once a year every year.  He has followed by urology and last seen about a year ago.  He underwent cystoscopy about 5 years ago.  Urinary bladder was noted to be unremarkable on CT scan. May benefit from urology follow-up after he is treated for his current infection. Reason for his frequent UTIs not entirely clear.  Could be immunosuppressed considering  his history of rheumatoid arthritis. He will need at the very least 5 days of ertapenem .    Sinusitis Noted sinusitis on CT head w/ noted headache and intermittent blurry vision Symptomatically improving with meropenem /ertapenem   Start nasal steroid  Monitor    Chronic systolic CHF EF was 30 to 35% based on echocardiogram from March 2025.  He is well compensated.  He is on furosemide  carvedilol  ARB.  Blood pressure is noted to be elevated this morning.  Renal function is normal.  Potassium is 3.9. Strict Is and Os and daily weights    Diabetes mellitus type 2 HbA1c was 8.6 earlier this month.  On SSI.  Monitor CBGs.   History of coronary artery disease Stable.   Rheumatoid arthritis It looks like he was recently diagnosed with rheumatoid arthritis.  He was on prednisone  recently but he has completed the course.  Has not yet started taking methotrexate.      Objective Vital signs were reviewed and unremarkable. Vitals:   12/26/23 0243 12/26/23 0325 12/26/23 0944 12/26/23 1350  BP: (!) 165/83 (!) 158/89 (!) 173/89 131/85  Pulse: 72 81 96 86  Temp: 98.5 F (36.9 C) 98 F (36.7 C) 98.2 F (36.8 C) 98.7 F (37.1 C)  Resp: 20 18  16   SpO2: 97% 96% 96% 96%  TempSrc: Oral Oral Oral Oral    Exam Physical Exam Constitutional:      Appearance: He is normal weight.  HENT:     Head: Normocephalic and atraumatic.     Nose:  Nose normal.     Mouth/Throat:     Mouth: Mucous membranes are moist.  Eyes:     Pupils: Pupils are equal, round, and reactive to light.  Cardiovascular:     Rate and Rhythm: Normal rate and regular rhythm.  Pulmonary:     Effort: Pulmonary effort is normal.  Abdominal:     General: Bowel sounds are normal.  Musculoskeletal:        General: Normal range of motion.     Cervical back: Normal range of motion.  Skin:    General: Skin is warm.  Neurological:     General: No focal deficit present.  Psychiatric:        Mood and Affect: Mood normal.       Labs / Other Information There are no new results to review at this time. CT Head Wo Contrast CLINICAL DATA:  Blurry vision fall  EXAM: CT HEAD WITHOUT CONTRAST  TECHNIQUE: Contiguous axial images were obtained from the base of the skull through the vertex without intravenous contrast.  RADIATION DOSE REDUCTION: This exam was performed according to the departmental dose-optimization program which includes automated exposure control, adjustment of the mA and/or kV according to patient size and/or use of iterative reconstruction technique.  COMPARISON:  MRI report 08/22/2000  FINDINGS: Brain: No acute territorial infarction, hemorrhage or intracranial mass. Mild atrophy. Mild white matter hypodensity likely chronic small vessel ischemic change. The ventricles are nonenlarged  Vascular: No hyperdense vessels.  Carotid vascular calcification  Skull: Normal. Negative for fracture or focal lesion.  Sinuses/Orbits: Fluid level right maxillary sinus with mucosal thickening. Moderate mucosal thickening in the ethmoid sinuses  Other: None  IMPRESSION: 1. No CT evidence for acute intracranial abnormality. 2. Mild atrophy and chronic small vessel ischemic changes of the white matter. 3. Right maxillary sinus disease with fluid level, possible acute sinusitis.  Electronically Signed   By: Luke Bun M.D.   On: 12/25/2023 16:03 CT ABDOMEN PELVIS W CONTRAST CLINICAL DATA:  Abdominal pain  EXAM: CT ABDOMEN AND PELVIS WITH CONTRAST  TECHNIQUE: Multidetector CT imaging of the abdomen and pelvis was performed using the standard protocol following bolus administration of intravenous contrast.  RADIATION DOSE REDUCTION: This exam was performed according to the departmental dose-optimization program which includes automated exposure control, adjustment of the mA and/or kV according to patient size and/or use of iterative reconstruction technique.  CONTRAST:   OMNIPAQUE  IOHEXOL  300 MG/ML  SOLN  COMPARISON:  CT 12/16/2023, 05/04/2023, 05/15/2021, 07/31/2020  FINDINGS: Lower chest: Lung bases demonstrate chronic small left pleural effusion with mild rim thickening and adjacent chronic round atelectasis in the left lower lobe. Subpleural blebs in the right lower lobe. Lower sternotomy. Cardiomegaly and partially visualized cardiac pacing leads  Hepatobiliary: No focal liver abnormality is seen. No gallstones, gallbladder wall thickening, or biliary dilatation.  Pancreas: Unremarkable. No pancreatic ductal dilatation or surrounding inflammatory changes.  Spleen: Normal in size without focal abnormality.  Adrenals/Urinary Tract: Adrenal glands are normal. Chronic moderate nonspecific perinephric stranding. No hydronephrosis. Prominence of the ureters without obstructing stone. Bladder is unremarkable  Stomach/Bowel: Stomach within normal limits. No dilated small bowel. No acute bowel wall thickening. Diverticular disease of the left colon.  Vascular/Lymphatic: Aortic atherosclerosis. No enlarged abdominal or pelvic lymph nodes.  Reproductive: Prostate is unremarkable.  Other: Negative for ascites or free air. Small fat containing left inguinal hernia  Musculoskeletal: Chronic compression fracture at T12. Chronic superior endplate deformity at L3. Mild inferior endplate deformity at L2,  new compared with April CT. Posterior spinal fusion hardware L4 through upper sacrum with bilateral sacroiliac screws. Retained screws at S1 segment as before. Grade 1 anterolisthesis L5 on S1. Trace retrolisthesis L3 on L4.  IMPRESSION: 1. No CT evidence for acute intra-abdominal or pelvic abnormality. 2. Chronic nonspecific perinephric fat stranding. Slight prominence of the ureters but no hydronephrosis and no obstructing stone 3. Diverticular disease of the left colon without acute inflammatory process. 4. Chronic small left pleural effusion  with adjacent chronic round atelectasis in the left lower lobe. 5. Mild inferior endplate deformity at L2, new compared with lumbar CT from April but short-term stability compared with December 11 exam. 6. Aortic atherosclerosis.  Aortic Atherosclerosis (ICD10-I70.0).  Electronically Signed   By: Luke Bun M.D.   On: 12/25/2023 16:00  Lab Results  Component Value Date   WBC 6.8 12/26/2023   HGB 13.9 12/26/2023   HCT 41.3 12/26/2023   MCV 97.4 12/26/2023   PLT 145 (L) 12/26/2023   Last metabolic panel Lab Results  Component Value Date   GLUCOSE 171 (H) 12/26/2023   NA 137 12/26/2023   K 3.9 12/26/2023   CL 106 12/26/2023   CO2 24 12/26/2023   BUN 17 12/26/2023   CREATININE 0.69 12/26/2023   GFRNONAA >60 12/26/2023   CALCIUM 8.5 (L) 12/26/2023   PROT 6.5 12/25/2023   ALBUMIN  3.9 12/25/2023   LABGLOB 2.0 08/30/2020   AGRATIO 2.3 (H) 08/30/2020   BILITOT 1.1 12/25/2023   ALKPHOS 73 12/25/2023   AST 22 12/25/2023   ALT 29 12/25/2023   ANIONGAP 7 12/26/2023     Hospital at Home Admission Criteria Checklist:  Formal consent explained in detail and signed at the bedside: yes Patient meets inpatient admission criteria (see below for further details) yes Is pt Medicare FFS/[employer] Medicare-Medicaid, [employer], [employer] Medicare, [employer]age, [employer] ( required for initial launch with plan to expand)? yes Lives within 25 mil/ 30 min from [facility] within [place](pt may stay with family member during admission who lives within 25 miles or 30 min from [facility] w/in [facility])? yes Hemodynamically stable with relatively low risk of clinical deterioration-not requiring ICU? yes Age >55? yes Does not require frequent touch-points or complex interventions/medications (ie Titrated Infusions (IV insulin , heparin  drips, vasoactive drips, use of infused or injectable controlled substances, patients on insulin )? no Any Behavioral Health comorbidities likely to  increase risk for in-home care (ie Acute delirium or experiencing a marked altered mental status and cause is not a treatable condition in the home)? no Has the patient been on BIPAP during course of ED evaluation or hospitalization? no IF YES, Has the patient been off of BIPAP for >24 hours(If NO-THEN PATIENT DOES MEET INCLUSION CRITERIA)? no On Room Air or Needs oxygen at home (<6L)? is not on home oxygen therapy. Active safety concerns (ie Unable to use bedside commode independently and lacks caregiver support for safety- needs SNF placement, unable to obtain IV access)? no Has skin check been performed? yes pending  Has Physical Therapy screened the patient? not applicable  Common admission diagnoses including: CAP, COPD Exacerbation, Acute on chronic heart failure, Cellulitis, UTI , dehydration, acute resp failure with hypoxia (requiring <5L)   Social Screening:  - Has the family been directly contacted about Hospital at Home program with consent obtained (if yes- please document who was spoken to with name and phone number)? yes  -Was the family approached about the use of TOC pharmacy for medications at discharge? no  Denies significant ETOH intake? no Does not smoke and understands may not smoke in the presence of oxygen? not applicable Patient states able to use iPad/phone for communication/has family who is able to use? yes Patient has agreed to be compliant with medication and treatment regimen of the program? yes Any active drug use in patient or primary caregiver including daily dosing of methadone? no Stable home environment ( access to appropriate heating in cold conditions and/or appropriate air conditioning in hot conditions and/or no running water /electricity)? yes  No aggressive pets at home? no Firearm present? yes  With ability or willingness to store them unloaded in a locked case for duration of hospitalization? yes Ambulatory? yes  no difficulty Bed bugs present on home  evaluation? no Family support system in place? yes Patient feels safe at home and does not endorse any violence? yes Any actively decompensated behavioral health issues including agitation/aggressive behavior? no  Patient requests food to be provided by hospital home program? no PT/OT eval completed and not requiring SNF, ALF, inpatient rehab? not applicable  To be admitted to the Hospital at [facility] program, a patient generally must meet the following: 1. Requirement for Inpatient Level of Care: The patient's condition must necessitate an inpatient level of care. This is typically indicated by one or more of the following, depending on their specific diagnosis:  Persistent tachycardia despite appropriate treatment (e.g., for Heart Failure, UTI). Persistent tachypnea (rapid breathing) or dyspnea (shortness of breath) that hasn't improved sufficiently with observation care (e.g., for Heart Failure, Pneumonia, Viral Illness, COVID). Hypoxemia (low oxygen levels), such as a new need for oxygen, an increased need from baseline, or specific oxygen saturation levels (e.g., SpO2 <90-94% depending on the condition) that persist despite observation (e.g., for Heart Failure, COPD, Pneumonia, Viral Illness, COVID). Need for Intravenous (IV) hydration due to an inability to maintain oral hydration, which persists despite observation care (e.g., for Cellulitis, UTI, Viral Illness, COVID). Specific to Heart Failure: Persistent pulmonary edema, indicated by a new oxygen need, lack of improvement with IV diuretics, and ongoing tachypnea/dyspnea. Specific to COPD: A decrease in known baseline resting oxygen saturation (SpO2) by 4% or more, or an increase in pre-existing supplemental oxygen requirements, which persists despite observation and requires continued close monitoring. Specific to Pneumonia: A Pneumonia Severity Index (PSI) class IV (moderate risk). Specific to Cellulitis: Failure of outpatient antibiotic  therapy (indicated by progression or no improvement after a minimum of 48 hours on an adequate regimen) or a clinical presentation (like acuity or rapidity of progression) that requires the intensity of monitoring found in an inpatient setting. Specific to UTI: Persistence or worsening of clinical findings like fever, pain, or dehydration despite observation care; presence of significant uropathy; suspected infection of an indwelling prosthetic device, stent, implant, or graft; or pregnancy with suspected pyelonephritis.  2. Appropriateness for Hospital at Home Setting: The patient's overall clinical picture, including the severity of their illness, their care needs, and their medical history and comorbidities, must be suitable for management in the Hospital at Home environment. This essentially means that none of the exclusion criteria (listed below) are met.  Unified Exclusion Criteria for Hospital at Home Admission: A patient would not be eligible for Hospital at Home if any of the following are present: Hemodynamic Instability: Hypotension (low blood pressure) is present. Respiratory Instability or Needs Beyond Program Capability: There is a new need for invasive or noninvasive ventilatory assistance (like BiPAP or a ventilator). Oxygenation is not sufficient, generally indicated if an FiO2 (  fraction of inspired oxygen) of 45% (which is about 6 Liters/minute via nasal cannula) or more is required to keep oxygen saturation (SpO2) at 90% or greater. Monitoring or Procedural Needs Beyond Program Capability: There is a need for invasive monitoring, such as a pulmonary artery catheter or an arterial line. There is a need for immediate-response telemetry monitoring (for dangerous arrhythmia detection and subsequent immediate intervention). The required medication regimen is beyond the capabilities of Hospital at Home (e.g., dosing intervals are too frequent for home administration). There is a need  for a procedure that cannot be performed by the Hospital at [facility] team (e.g., significant wound debridement or abscess drainage for cellulitis, or percutaneous nephrostomy for a complicated UTI). Significant Organ Dysfunction or Markers of Severe Illness: Mental status is not at baseline, or there is altered mental status suggestive of inadequate perfusion. Renal (kidney) function is unstable or showing an ongoing decline. There is evidence of inadequate perfusion, such as metabolic acidosis or myocardial ischemia. Uncompensated acidosis is present. Condition-Specific Severity or Complications Making Home Care Unsuitable: For Heart Failure: Known severe cardiac valvular disease (e.g., aortic stenosis, mitral regurgitation); or severe peripheral edema that impairs the ability to urinate or ambulate. For COPD: Known concurrent comorbidity or finding that indicates a higher-risk COPD exacerbation (e.g., pulmonary fibrosis, cavitation, pleural effusion, pneumothorax, rib fracture). For Pneumonia: Pneumonia Severity Index (PSI) class V (indicating high risk for inpatient mortality); known concurrent comorbidity or finding that indicates higher-risk pneumonia (e.g., pulmonary fibrosis, cavitation, large or loculated pleural effusion); or a concomitant serious infectious process like endocarditis or empyema. For Cellulitis: Orbital, periorbital, or necrotizing infection is suspected; or a concomitant serious infectious process like endocarditis, septic emboli, or septic joint space infection. For UTI: Urinary tract obstruction (e.g., kidney stone, bladder outlet obstruction); or a concomitant serious infectious process like endocarditis or septic emboli. For Viral Illness & COVID-19: A concomitant serious infectious process like endocarditis or empyema.  General Comorbidities or Status:  The patient is significantly immunosuppressed (this applies to Pneumonia, Cellulitis, UTI, Viral Illness, and  COVID-19). The patient meets inpatient admission criteria for a second diagnosis, or has care needs beyond the capabilities of Hospital at Home due to an active clinically significant comorbidity. (This is a general exclusion across all listed conditions)  Time spent: >60 minutes  [facility] 12/26/2023, 3:33 PM   "

## 2023-12-26 NOTE — Care Management (Signed)
 Transition of Care [facility]) - Inpatient Brief Assessment   Patient Details  Name: [name] MRN: [id number] Date of Birth: 15-Mar-1955  Transition of Care [facility]) CM/SW Contact:    Corean JAYSON Canary, RN Phone Number: 12/26/2023, 5:10 PM   Clinical Narrative: Patient presented with flank pain concerning for pyelonephritis ,ESBL UTI On IV antibiotics, He transitioned to the hospital at home program. He is ambulatory and has no needs identified at this time      Transition of Care Asessment: Insurance and Status: Insurance coverage has been reviewed Patient has primary care physician: Yes Home environment has been reviewed: Home with spouse Prior level of function:: Independet Prior/Current Home Services: No current home services Social Drivers of Health Review: SDOH reviewed no interventions necessary Readmission risk has been reviewed: Yes Transition of care needs: no transition of care needs at this time

## 2023-12-26 NOTE — Progress Notes (Signed)
 1948---- RN introduction to patient completed. Patient sitting in the chair. Patient A&O with [facility] EMT present.  Medication administration assistance and assessment completed. Refused cardiac medications and reported he has his own cardiac medications and prefers to discuss [facility] cardiac medications with cardiologists. The patient reported that DM is diet-controlled. The patient reported that he is ready for bed. RN, patient and caregiver encouraged to call or utilize the tablet help button to support concerns.

## 2023-12-26 NOTE — Progress Notes (Signed)
 This Emt visited PT for scheduled meds. This EMT found PT sitting in his recliner chair. PT has refused meds: AVAPRO  and COREG  stating they wanted to discuss meds with their cardiovascular physician and [facility] physician. Pt accepted and took med: ambien . Patient vitals and were captured and documented. Donor had no other complaints or concerns.

## 2023-12-26 NOTE — Progress Notes (Signed)
 PT Cancellation Note  Patient Details Name: [name] MRN: [id number] DOB: 08-Dec-1955   Cancelled Treatment:    Reason Eval/Treat Not Completed: Other (comment). Pt transitioned to hospital at home program today. PT will follow up with hospital at home team tomorrow to determine if mobility concerns are present and if PT evaluation is warranted.   [name] 12/26/2023, 5:50 PM

## 2023-12-26 NOTE — Progress Notes (Signed)
 Medic and EMT went  o collect pt from [facility] room. Patient was packed and ready upon crew arrival and ambulated to wheelchair. Patient loaded onto wheelchair fleeta and transported home. Patient aware of direction, alert and oriented x4, and able to tell us  which 3 cars were his to help us  identify his house. Patients wife met us  in the driveway. Patient expressed frustration that he was continuing to wait on his admission to hospital at home and stated he continued to be told it would be a few more hours. Patient also expressed frustration that medications had been changed but he was continuing to take medications at the originally prescribed dose, e.g., 6.25mg  of Carvedilol  2x per day instead of 3.125mg ). Pt stated that he    Patient unloaded from wheelchair van and walked into house with no assistance. Home was free of clutter and safe for patient to ambulate in. Patient stated he spends most of the time in his living room or bedroom.   Patient assessed as noted. Clear lung sounds. Slightly diminished on the left. No complaints of difficulty breathing. Pulses strong. Bruising on left arm pre hospital as noted in media.   Equipment set up.  Patient had significant amount of medications, some that he hadn't taken, some that he was taking, and then some bottles that were subsets of medications. Patient expressed frustration over the sequestration process and stated he wanted to take the medicines his cardiologist prescribed because every time he goes into the hospital, it takes months for his medications to be adjusted properly Medication changes reviewed with Virtual RN. Patient advised that he should not take any of his at home medications unless [facility] supply or approved by pharmacy. Tamper bag not available, field team notified and patient and patients wife informed many times to contact virtual RN to take any medications or supplements.   Virtual visit completed with Tax Inspector. Patient demonstrated they  could answer the tablet and see/hear the virtual RN. Medic and EMT cleared the home and returned to the hub.

## 2023-12-27 ENCOUNTER — Telehealth (HOSPITAL_BASED_OUTPATIENT_CLINIC_OR_DEPARTMENT_OTHER): Payer: Self-pay | Admitting: *Deleted

## 2023-12-27 DIAGNOSIS — R7989 Other specified abnormal findings of blood chemistry: Secondary | ICD-10-CM

## 2023-12-27 LAB — URINE CULTURE: Culture: >=100000 — AB

## 2023-12-27 MED ORDER — KETOROLAC TROMETHAMINE 15 MG/ML IJ SOLN
15.0000 mg | Freq: Two times a day (BID) | INTRAMUSCULAR | Status: DC | PRN
  Administered 2023-12-27: 15 mg via INTRAVENOUS
  Filled 2023-12-27 (×2): qty 1

## 2023-12-27 MED ORDER — FUROSEMIDE 20 MG PO TABS
20.0000 mg | ORAL_TABLET | Freq: Two times a day (BID) | ORAL | Status: DC
  Administered 2023-12-27 – 2023-12-30 (×6): 20 mg via ORAL
  Filled 2023-12-27 (×8): qty 1

## 2023-12-27 MED ORDER — CARVEDILOL 3.125 MG PO TABS
3.1250 mg | ORAL_TABLET | Freq: Two times a day (BID) | ORAL | Status: DC
  Administered 2023-12-27 – 2023-12-30 (×6): 3.125 mg via ORAL
  Filled 2023-12-27 (×8): qty 1

## 2023-12-27 MED ORDER — VALSARTAN 80 MG PO TABS
40.0000 mg | ORAL_TABLET | Freq: Every day | ORAL | Status: DC
  Administered 2023-12-28 – 2023-12-30 (×3): 40 mg via ORAL

## 2023-12-27 MED ORDER — CARVEDILOL 6.25 MG PO TABS: 6.2500 mg | ORAL_TABLET | Freq: Two times a day (BID) | ORAL | Status: DC

## 2023-12-27 NOTE — Progress Notes (Signed)
 1948---- RN introduction to patient completed. Patient sitting in the chair. Patient A&O with [facility] EMT present.  Medication administration assistance and assessment completed. Refused cardiac medications and reported he has his own cardiac medications and prefers to discuss [facility] cardiac medications with cardiologists. The patient reported that DM is diet-controlled. The patient reported that he is ready for bed. RN, patient and caregiver encouraged to call or utilize the tablet help button to support concerns. [facility] Notified.

## 2023-12-27 NOTE — Plan of Care (Signed)
" °  Problem: Clinical Measurements: Goal: Cardiovascular complication will be avoided Outcome: Progressing   Problem: Clinical Measurements: Goal: Diagnostic test results will improve Outcome: Progressing   Problem: Clinical Measurements: Goal: Will remain free from infection Outcome: Progressing   Problem: Clinical Measurements: Goal: Ability to maintain clinical measurements within normal limits will improve Outcome: Progressing   Problem: Nutrition: Goal: Adequate nutrition will be maintained Outcome: Progressing   Problem: Elimination: Goal: Will not experience complications related to bowel motility Outcome: Progressing Goal: Will not experience complications related to urinary retention Outcome: Progressing   Problem: Pain Managment: Goal: General experience of comfort will improve and/or be controlled Outcome: Progressing   Problem: Safety: Goal: Ability to remain free from injury will improve Outcome: Progressing   Problem: Skin Integrity: Goal: Risk for impaired skin integrity will decrease Outcome: Progressing   "

## 2023-12-27 NOTE — Progress Notes (Signed)
 Arrived to patient   he is alert and oriented   he states he feels some better   he was out in the garage walking upon arrival   we went thru his meds and he said he takes his own valsartan  instead of our BP medications   pictures were taken of the pill and the pill bottle   they were uploaded to [employer]   all medications gave to patient and IV antibiotic was given as well  he was very pleasant and cooperative  see flow chart for further

## 2023-12-27 NOTE — Progress Notes (Signed)
 Physical Therapy Note  Initial evaluation acknowledged. Via secure chat, Dr. Eldonna confirmed pt does not need PT evaluation. Touched base with RN this morning. Confirmed patient is mobilizing well and no further acute needs identified at this time. Signing off. Please don't hesitate to reach out to our team if there is any significant change in functional status. Thank you!  [name], PT, DPT [facility]  Rehabilitation Services Physical Therapist Office: [phone] Website: [email or web address]

## 2023-12-27 NOTE — Progress Notes (Signed)
 Video call completed with patient and his wife. Patient appears stable and reports 6/10 pain. States toradol  made him feels a little drowsy but he doesn't feel like it did much for his pain. Bedtime medication was administered and all questions answered. Patient encouraged to call RN via phone or tablet if needs arise overnight.

## 2023-12-27 NOTE — Progress Notes (Signed)
 Called patient to reassess his pain level. He reports that his pain is a little better. No other complaints at this time. Vital Signs WNL on Current Health.

## 2023-12-27 NOTE — Assessment & Plan Note (Signed)
 Currently euvolemic at this time Furosemide  20 mg p.o. twice daily

## 2023-12-27 NOTE — Assessment & Plan Note (Signed)
 Home duloxetine  40 mg daily resumed

## 2023-12-27 NOTE — Telephone Encounter (Signed)
 Post ED Visit - Positive Culture Follow-up: Successful Patient Follow-Up  Culture assessed and recommendations reviewed by:  []  Rankin Dee, Pharm.D. []  Venetia Gully, Pharm.D., BCPS AQ-ID []  Garrel Crews, Pharm.D., BCPS []  Almarie Lunger, Pharm.D., BCPS []  [place], [place].D., BCPS, AAHIVP []  Rosaline Bihari, Pharm.D., BCPS, AAHIVP []  Vernell Meier, PharmD, BCPS []  Latanya Hint, PharmD, BCPS []  Donald Medley, PharmD, BCPS []  Rocky Bold, PharmD  Positive urine culture  []  Patient discharged without antimicrobial prescription and treatment is now indicated [x]  Organism is resistant to prescribed ED discharge antimicrobial []  Patient with positive blood cultures  Call back received from pt.  Pt states he is currently receiving [facility].  Pt advised to continue the current antibiotic regimen as directed by his [facility]  physician.  No further follow up needed.  Contacted patient, date 12/27/2023, time 1655   [name] 12/27/2023, 5:04 PM

## 2023-12-27 NOTE — Assessment & Plan Note (Signed)
 Aspirin 81 mg daily.

## 2023-12-27 NOTE — Plan of Care (Signed)
" °  Problem: Education: Goal: Knowledge of General Education information will improve Description: Including pain rating scale, medication(s)/side effects and non-pharmacologic comfort measures Outcome: Progressing   Problem: Health Behavior/Discharge Planning: Goal: Ability to manage health-related needs will improve Outcome: Progressing   Problem: Clinical Measurements: Goal: Ability to maintain clinical measurements within normal limits will improve Outcome: Progressing Goal: Diagnostic test results will improve Outcome: Progressing   Problem: Activity: Goal: Risk for activity intolerance will decrease Outcome: Progressing   Problem: Pain Managment: Goal: General experience of comfort will improve and/or be controlled Outcome: Progressing   Problem: Safety: Goal: Ability to remain free from injury will improve Outcome: Progressing   Problem: Urinary Elimination: Goal: Signs and symptoms of infection will decrease Outcome: Progressing   "

## 2023-12-27 NOTE — Assessment & Plan Note (Signed)
 Diet controlled.

## 2023-12-27 NOTE — Assessment & Plan Note (Signed)
 Stable at this time Resumed home Coreg  3.125 mg p.o. twice daily with meals, valsartan  40 mg daily, aspirin  81 mg daily

## 2023-12-27 NOTE — Progress Notes (Signed)
 Called patient's cell phone and rang the tablet, no answer. Vital signs WNL on Current Health.

## 2023-12-27 NOTE — Assessment & Plan Note (Signed)
 Home carvedilol  3.125 mg p.o. twice daily, furosemide  20 mg p.o. twice daily, valsartan  40 mg daily

## 2023-12-27 NOTE — Assessment & Plan Note (Addendum)
 Ertapenem  1 g IV daily, to complete 5-day course minimum 12/24: Day 5 of antibiotic - continue given ongoing symptom burden with flank pain, urinary frequency and severe urgency 12/25 - antibiotics completed, none further recommended by ID. Case reviewed and discussed.  --Follow up with urology --Pain control PRN for flank pain

## 2023-12-27 NOTE — Plan of Care (Signed)
  Problem: Education: Goal: Knowledge of General Education information will improve Description: Including pain rating scale, medication(s)/side effects and non-pharmacologic comfort measures Outcome: Progressing   Problem: Health Behavior/Discharge Planning: Goal: Ability to manage health-related needs will improve Outcome: Progressing   Problem: Clinical Measurements: Goal: Ability to maintain clinical measurements within normal limits will improve Outcome: Progressing Goal: Will remain free from infection Outcome: Progressing Goal: Diagnostic test results will improve Outcome: Progressing Goal: Respiratory complications will improve Outcome: Progressing Goal: Cardiovascular complication will be avoided Outcome: Progressing   Problem: Activity: Goal: Risk for activity intolerance will decrease Outcome: Progressing   Problem: Nutrition: Goal: Adequate nutrition will be maintained Outcome: Progressing   Problem: Coping: Goal: Level of anxiety will decrease Outcome: Progressing   Problem: Elimination: Goal: Will not experience complications related to bowel motility Outcome: Progressing Goal: Will not experience complications related to urinary retention Outcome: Progressing   Problem: Pain Managment: Goal: General experience of comfort will improve and/or be controlled Outcome: Progressing   Problem: Safety: Goal: Ability to remain free from injury will improve Outcome: Progressing   Problem: Skin Integrity: Goal: Risk for impaired skin integrity will decrease Outcome: Progressing   Problem: Education: Goal: Ability to describe self-care measures that may prevent or decrease complications (Diabetes Survival Skills Education) will improve Outcome: Progressing Goal: Individualized Educational Video(s) Outcome: Progressing   Problem: Coping: Goal: Ability to adjust to condition or change in health will improve Outcome: Progressing   Problem: Fluid  Volume: Goal: Ability to maintain a balanced intake and output will improve Outcome: Progressing   Problem: Health Behavior/Discharge Planning: Goal: Ability to identify and utilize available resources and services will improve Outcome: Progressing Goal: Ability to manage health-related needs will improve Outcome: Progressing   Problem: Metabolic: Goal: Ability to maintain appropriate glucose levels will improve Outcome: Progressing   Problem: Nutritional: Goal: Maintenance of adequate nutrition will improve Outcome: Progressing Goal: Progress toward achieving an optimal weight will improve Outcome: Progressing   Problem: Skin Integrity: Goal: Risk for impaired skin integrity will decrease Outcome: Progressing   Problem: Tissue Perfusion: Goal: Adequacy of tissue perfusion will improve Outcome: Progressing   Problem: Urinary Elimination: Goal: Signs and symptoms of infection will decrease Outcome: Progressing

## 2023-12-27 NOTE — TOC Initial Note (Signed)
 Transition of Care [facility]) - Initial/Assessment Note    Patient Details  Name: [name] MRN: [id number] Date of Birth: [birth year]  [facility]) CM/SW Contact:    Debarah Saunas, RN Phone Number: 12/27/2023, 8:00 AM  Clinical Narrative:                 68 yo male with UTI. RNCM will contact pt regarding PCP follow-up appt post discharge.  Expected Discharge Plan: Home/Self Care Barriers to Discharge: Continued Medical Work up   Patient Goals and CMS Choice Patient states their goals for this hospitalization and ongoing recovery are:: remain in home with spouse          Expected Discharge Plan and Services                                              Prior Living Arrangements/Services   Lives with:: Spouse Patient language and need for interpreter reviewed:: Yes Do you feel safe going back to the place where you live?: Yes      Need for Family Participation in Patient Care: No (Comment) Care giver support system in place?: Yes (comment)   Criminal Activity/Legal Involvement Pertinent to Current Situation/Hospitalization: No - Comment as needed  Activities of Daily Living   ADL Screening (condition at time of admission) Independently performs ADLs?: Yes (appropriate for developmental age) Is the patient deaf or have difficulty hearing?: No Does the patient have difficulty seeing, even when wearing glasses/contacts?: Yes (L eye vision issues) Does the patient have difficulty concentrating, remembering, or making decisions?: No  Permission Sought/Granted Permission sought to share information with : Family Supports Permission granted to share information with : Yes, Verbal Permission Granted        Permission granted to share info w Relationship: spouse     Emotional Assessment       Orientation: : Oriented to Self, Oriented to Place, Oriented to  Time, Oriented to Situation Alcohol / Substance Use: Not Applicable Psych Involvement:  No (comment)  Admission diagnosis:  ESBL (extended spectrum beta-lactamase) producing bacteria infection [A49.9, Z16.12] Acute pyelonephritis [N10] UTI due to extended-spectrum beta lactamase (ESBL) producing Escherichia coli [N39.0, B96.29, Z16.12] Patient Active Problem List   Diagnosis Date Noted   UTI due to extended-spectrum beta lactamase (ESBL) producing Escherichia coli 12/25/2023   Type 2 diabetes mellitus (HCC) 12/25/2023   Seropositive rheumatoid arthritis of multiple sites (HCC) 12/25/2023   Contact dermatitis 08/26/2022   Osteoarthritis of right knee 12/17/2021   Acute on chronic systolic CHF (congestive heart failure), NYHA class 3 (HCC) 06/23/2021   Low testosterone  03/10/2021   Lumbar back pain 10/03/2020   Pseudoarthrosis of lumbar spine 10/03/2020   Unstable angina (HCC) 09/06/2020   Encounter for health maintenance examination in adult 09/03/2020   Encounter for screening for malignant neoplasm of colon 09/03/2020   Impaired fasting blood sugar 09/03/2020   Fatigue 09/03/2020   Screening for prostate cancer 09/03/2020   History of gout 09/03/2020   Medicare welcome exam 09/03/2020   Insomnia 01/11/2020   Back pain 01/11/2020   History of urinary tract infection 01/11/2020   Vaccine counseling 01/11/2020   Rumination 01/11/2020   Anxiety 01/11/2020   Chronic diarrhea 12/05/2019   Food intolerance 12/05/2019   Abdominal cramping 12/05/2019   Abdominal bloating 12/05/2019   Need for influenza vaccination 12/05/2019   Right knee  pain 11/03/2019   Shoulder pain, acute 06/09/2019   Hip pain 02/24/2019   Knee pain 02/24/2019   Bulbous urethral stricture 10/15/2017   Personal history of urethral stricture 08/10/2017   Chronic systolic heart failure (HCC) 06/22/2017   S/P CABG x 4 06/14/2017   ACS (acute coronary syndrome) (HCC) 06/08/2017   Angina pectoris    Gout 08/30/2014   Neck pain, chronic 12/17/2010   Back pain, chronic 12/01/2010   Pericarditis  10/02/2010   CAD (coronary artery disease) 05/14/2010   Hypertension    Hypercholesterolemia    LBBB (left bundle branch block)    NSTEMI (non-ST elevated myocardial infarction) (HCC)    PCP:  Emerick Avelina POUR, PA-C Pharmacy:   [facility] [place], [place] - [address] [place] [address] Phone: [phone] Fax: [phone]     Social Drivers of Health (SDOH) Social History: SDOH Screenings   Food Insecurity: No Food Insecurity (12/25/2023)  Housing: Low Risk (12/25/2023)  Transportation Needs: No Transportation Needs (12/25/2023)  Utilities: Not At Risk (12/25/2023)  Depression (PHQ2-9): Low Risk (07/14/2022)  Financial Resource Strain: Low Risk (01/09/2021)  Social Connections: Socially Integrated (12/25/2023)  Tobacco Use: Medium Risk (12/26/2023)   SDOH Interventions:     Readmission Risk Interventions     No data to display

## 2023-12-27 NOTE — Progress Notes (Addendum)
 " PROGRESS NOTE - Telemedicine  [name]  FMW:[id number] DOB: December 28, 1955 DOA: 12/25/2023 PCP: Emerick Avelina POUR, PA-C   [name] is a 68 year old male with history of heart failure reduced ejection fraction, status post defibrillator status, hypertension, CAD status post CABG, rheumatoid arthritis, GERD, insomnia, history of ESBL UTI.  12/25/2023: Patient presents to the emergency department for chief concerns of flank pain, right greater than left.  12/25/2023: Patient admitted to hospitalist service for chief concerns of ESBL UTI.  Meropenem  was initiated.  12/26/2023: Day 2 of abx. Patient care transferred to hospital at home service.   12/22: I assumed care of patient. Day 3 of abx. Urine culture positive sensitivity/specificity resulted.  Will continue with ertapenem  given no good p.o. alternative.  Toradol  15 mg IV twice daily as needed for moderate pain, 2 days ordered.  Assessment & Plan:   Principal Problem:   UTI due to extended-spectrum beta lactamase (ESBL) producing Escherichia coli Active Problems:   CAD (coronary artery disease)   Hypercholesterolemia   S/P CABG x 4   Hypertension   Back pain, chronic   Chronic systolic heart failure (HCC)   Anxiety   Type 2 diabetes mellitus (HCC)   Seropositive rheumatoid arthritis of multiple sites (HCC)   Assessment and Plan:  * UTI due to extended-spectrum beta lactamase (ESBL) producing Escherichia coli Ertapenem  1 g IV daily, to complete 5-day course 12/22: Day 3 of antibiotic  CAD (coronary artery disease) Aspirin  81 mg daily  S/P CABG x 4 Stable at this time Resumed home Coreg  3.125 mg p.o. twice daily with meals, valsartan  40 mg daily, aspirin  81 mg daily  Type 2 diabetes mellitus (HCC) Diet controlled  Anxiety Home duloxetine  40 mg daily resumed  Chronic systolic heart failure (HCC) Currently euvolemic at this time Furosemide  20 mg p.o. twice daily  Hypertension Home carvedilol  3.125 mg p.o.  twice daily, furosemide  20 mg p.o. twice daily, valsartan  40 mg daily   DVT prophylaxis: ambulation as tolerated  Code Status: Full code Family Communication: No Disposition Plan: Pending clinical course Level of care: Hospital at Home Med-Surg  Consultants:  Pharmacy  Procedures:  None  Antimicrobials: 12/23: Day 3 of antibiotic  Subjective:  At bedside, via video telemedicine encounter, patient was able to tell me his first and last name, date of birth.  Patient does not appear to be in acute distress.  He reports he slept pretty well last night.  The trip on the ambulance from the hospital to home was excruciating due to all the bumps in the road and his flank pain.  I discussed options in terms of pain management, patient initially refuses as he does not want to have any opioid prescriptions.  We discussed Toradol  as an option and he states he is willing to try this.  I encouraged him to drink water  as Toradol  affects the kidneys if patient is dehydrated.  He endorses understanding.  Objective: Vitals:   12/26/23 1741 12/26/23 1943 12/26/23 1948 12/27/23 1200  BP: (!) 162/96 (!) 171/81  (!) 144/84  Pulse: 80 87  92  Resp: 18 18  18   Temp: 97.8 F (36.6 C) 98.2 F (36.8 C)  98.3 F (36.8 C)  TempSrc: Oral Oral  Oral  SpO2: 95% 94% 95% 99%   No intake or output data in the 24 hours ending 12/27/23 1746 There were no vitals filed for this visit.  Examination was completed with the assistance of: Barnie Bud, paramedic, who was present  in the house during the virtual encounter:  General exam: Appears calm and comfortable  Respiratory system: Clear to auscultation. Respiratory effort normal. Cardiovascular system: S1 & S2 heard, RRR. No murmurs. No pedal edema. Gastrointestinal system: Abdomen is nondistended, soft and nontender. No organomegaly or masses felt. Normal bowel sounds heard. Central nervous system: Alert and oriented. No focal neurological  deficits. Extremities: Symmetric 5 x 5 power. Skin: No rashes, lesions or ulcers Psychiatry: Judgement and insight appear normal. Mood & affect appropriate.   Data Reviewed: I have personally reviewed following labs and imaging studies  CBC: Recent Labs  Lab 12/25/23 1426 12/26/23 0531  WBC 10.2 6.8  NEUTROABS 8.3*  --   HGB 16.0 13.9  HCT 48.1 41.3  MCV 99.2 97.4  PLT 172 145*   Basic Metabolic Panel: Recent Labs  Lab 12/25/23 1426 12/26/23 0531  NA 137 137  K 4.2 3.9  CL 101 106  CO2 27 24  GLUCOSE 268* 171*  BUN 17 17  CREATININE 1.01 0.69  CALCIUM 9.4 8.5*   GFR: Estimated Creatinine Clearance: 109 mL/min (by C-G formula based on SCr of 0.69 mg/dL).  Liver Function Tests: Recent Labs  Lab 12/25/23 1426  AST 22  ALT 29  ALKPHOS 73  BILITOT 1.1  PROT 6.5  ALBUMIN  3.9   Recent Labs  Lab 12/25/23 1426  LIPASE 44   CBG: Recent Labs  Lab 12/25/23 1345 12/25/23 2222 12/26/23 0802 12/26/23 1204 12/26/23 1643  GLUCAP 254* 268* 159* 248* 240*   Sepsis Labs: Recent Labs  Lab 12/25/23 1515 12/25/23 1651  LATICACIDVEN 1.7 1.4   Recent Results (from the past 240 hours)  Urine Culture     Status: Abnormal   Collection Time: 12/25/23  2:37 PM   Specimen: Urine, Clean Catch  Result Value Ref Range Status   Specimen Description   Final    URINE, CLEAN CATCH Performed at [facility], [address], [place], [place] [address]    Special Requests   Final    NONE Performed at [facility], [address], [place], [place] [address]    Culture (A)  Final    >=100,000 COLONIES/mL ESCHERICHIA COLI Confirmed Extended Spectrum Beta-Lactamase Producer (ESBL).  In bloodstream infections from ESBL organisms, carbapenems are preferred over piperacillin /tazobactam. They are shown to have a lower risk of mortality.    Report Status 12/27/2023 FINAL  Final   Organism ID, Bacteria ESCHERICHIA COLI (A)  Final       Susceptibility   Escherichia coli - MIC*    AMPICILLIN >=32 RESISTANT Resistant     CEFAZOLIN  (URINE) Value in next row Resistant      >=32 RESISTANTThis is a modified FDA-approved test that has been validated and its performance characteristics determined by the reporting laboratory.  This laboratory is certified under the Clinical Laboratory Improvement Amendments CLIA as qualified to perform high complexity clinical laboratory testing.    CEFEPIME Value in next row Resistant      >=32 RESISTANTThis is a modified FDA-approved test that has been validated and its performance characteristics determined by the reporting laboratory.  This laboratory is certified under the Clinical Laboratory Improvement Amendments CLIA as qualified to perform high complexity clinical laboratory testing.    ERTAPENEM  Value in next row Sensitive      >=32 RESISTANTThis is a modified FDA-approved test that has been validated and its performance characteristics determined by the reporting laboratory.  This laboratory is certified under the Clinical Laboratory Improvement Amendments  CLIA as qualified to perform high complexity clinical laboratory testing.    CEFTRIAXONE Value in next row Resistant      >=32 RESISTANTThis is a modified FDA-approved test that has been validated and its performance characteristics determined by the reporting laboratory.  This laboratory is certified under the Clinical Laboratory Improvement Amendments CLIA as qualified to perform high complexity clinical laboratory testing.    CIPROFLOXACIN  Value in next row Resistant      >=32 RESISTANTThis is a modified FDA-approved test that has been validated and its performance characteristics determined by the reporting laboratory.  This laboratory is certified under the Clinical Laboratory Improvement Amendments CLIA as qualified to perform high complexity clinical laboratory testing.    GENTAMICIN  Value in next row Sensitive      >=32 RESISTANTThis is a  modified FDA-approved test that has been validated and its performance characteristics determined by the reporting laboratory.  This laboratory is certified under the Clinical Laboratory Improvement Amendments CLIA as qualified to perform high complexity clinical laboratory testing.    NITROFURANTOIN  Value in next row Sensitive      >=32 RESISTANTThis is a modified FDA-approved test that has been validated and its performance characteristics determined by the reporting laboratory.  This laboratory is certified under the Clinical Laboratory Improvement Amendments CLIA as qualified to perform high complexity clinical laboratory testing.    TRIMETH /SULFA  Value in next row Resistant      >=32 RESISTANTThis is a modified FDA-approved test that has been validated and its performance characteristics determined by the reporting laboratory.  This laboratory is certified under the Clinical Laboratory Improvement Amendments CLIA as qualified to perform high complexity clinical laboratory testing.    AMPICILLIN/SULBACTAM Value in next row Resistant      >=32 RESISTANTThis is a modified FDA-approved test that has been validated and its performance characteristics determined by the reporting laboratory.  This laboratory is certified under the Clinical Laboratory Improvement Amendments CLIA as qualified to perform high complexity clinical laboratory testing.    PIP/TAZO Value in next row Sensitive      <=4 SENSITIVEThis is a modified FDA-approved test that has been validated and its performance characteristics determined by the reporting laboratory.  This laboratory is certified under the Clinical Laboratory Improvement Amendments CLIA as qualified to perform high complexity clinical laboratory testing.    MEROPENEM  Value in next row Sensitive      <=4 SENSITIVEThis is a modified FDA-approved test that has been validated and its performance characteristics determined by the reporting laboratory.  This laboratory is  certified under the Clinical Laboratory Improvement Amendments CLIA as qualified to perform high complexity clinical laboratory testing.    * >=100,000 COLONIES/mL ESCHERICHIA COLI  Blood culture (routine x 2)     Status: None (Preliminary result)   Collection Time: 12/25/23  4:51 PM   Specimen: BLOOD RIGHT FOREARM  Result Value Ref Range Status   Specimen Description   Final    BLOOD RIGHT FOREARM Performed at [facility], [address], [place], [place] [address]    Special Requests   Final    BOTTLES DRAWN AEROBIC AND ANAEROBIC Blood Culture adequate volume Performed at [facility], [address] [place], [place] [address]    Culture   Final    NO GROWTH 1 DAY Performed at [facility], [address], [place], [place] [address]    Report Status PENDING  Incomplete  Blood culture (routine x 2)     Status: None (Preliminary result)  Collection Time: 12/25/23  4:51 PM   Specimen: BLOOD LEFT FOREARM  Result Value Ref Range Status   Specimen Description   Final    BLOOD LEFT FOREARM Performed at [facility], [address] [place], [place] [address]    Special Requests   Final    BOTTLES DRAWN AEROBIC AND ANAEROBIC Blood Culture adequate volume Performed at [facility], [name] [place], [place] [address]    Culture   Final    NO GROWTH 1 DAY Performed at [facility], [address], [place], [place] [address]    Report Status PENDING  Incomplete    Scheduled Meds:  aspirin  EC  81 mg Oral q AM   carvedilol   3.125 mg Oral BID WC   DULoxetine   40 mg Oral Daily   furosemide   20 mg Oral BID   pantoprazole   40 mg Oral Daily   pregabalin   100 mg Oral QPM   valsartan   40 mg Oral Daily   zolpidem   10 mg Oral QHS   Continuous Infusions:  ertapenem  Stopped (12/27/23 1243)    LOS: 1 day   Time spent: 52 minutes  Dr. Sherre Location: [place]  [facility] If 7PM-7AM, please contact night-coverage 12/27/2023, 5:46 PM  "

## 2023-12-28 DIAGNOSIS — R369 Urethral discharge, unspecified: Secondary | ICD-10-CM | POA: Clinically undetermined

## 2023-12-28 LAB — BASIC METABOLIC PANEL WITH GFR
Anion gap: 9 (ref 5–15)
BUN: 18 mg/dL (ref 8–23)
CO2: 27 mmol/L (ref 22–32)
Calcium: 8.8 mg/dL — ABNORMAL LOW (ref 8.9–10.3)
Chloride: 103 mmol/L (ref 98–111)
Creatinine, Ser: 0.79 mg/dL (ref 0.61–1.24)
GFR, Estimated: >60 mL/min
Glucose, Bld: 159 mg/dL — ABNORMAL HIGH (ref 70–99)
Potassium: 3.9 mmol/L (ref 3.5–5.1)
Sodium: 138 mmol/L (ref 135–145)

## 2023-12-28 MED ORDER — ZOLPIDEM TARTRATE 10 MG PO TABS: 10.0000 mg | ORAL_TABLET | Freq: Every day | ORAL | 0 refills | Status: DC

## 2023-12-28 MED ORDER — ACETAMINOPHEN 10 MG/ML IV SOLN
1000.0000 mg | Freq: Two times a day (BID) | INTRAVENOUS | Status: AC
  Administered 2023-12-28: 1000 mg via INTRAVENOUS
  Filled 2023-12-28 (×2): qty 100

## 2023-12-28 NOTE — Progress Notes (Addendum)
 " PROGRESS NOTE - Telemedicine  [name]  FMW:[id number] DOB: 06/29/1955 DOA: 12/25/2023 PCP: Emerick Avelina POUR, PA-C   [name] is a 68 year old male with history of heart failure reduced ejection fraction, status post defibrillator status, hypertension, CAD status post CABG, rheumatoid arthritis, GERD, insomnia, history of ESBL UTI.  12/25/2023: Patient presents to the emergency department for chief concerns of flank pain, right greater than left.  12/25/2023: Patient admitted to hospitalist service for chief concerns of ESBL UTI.  Meropenem  was initiated.  12/26/2023: Day 2 of abx. Patient care transferred to hospital at home service.   12/22: I assumed care of patient. Day 3 of abx. Urine culture positive sensitivity/specificity resulted.  Will continue with ertapenem  given no good p.o. alternative.  Toradol  15 mg IV twice daily as needed for moderate pain, 2 days ordered.  12/23: Day 4 of antibiotic. Patient reports Toradol  did not help with his pain.  Though his pain overall is much better than when he first was in the hospital.  He reports he feels like he still having infection and would like to complete 5-day course of ertapenem .  Acetaminophen  1000 mg IV twice daily ordered  Assessment & Plan:   Principal Problem:   UTI due to extended-spectrum beta lactamase (ESBL) producing Escherichia coli Active Problems:   CAD (coronary artery disease)   Discharge from penis   Hypercholesterolemia   S/P CABG x 4   Hypertension   Back pain, chronic   Chronic systolic heart failure (HCC)   Anxiety   Type 2 diabetes mellitus (HCC)   Seropositive rheumatoid arthritis of multiple sites (HCC)   Assessment and Plan:  * UTI due to extended-spectrum beta lactamase (ESBL) producing Escherichia coli Ertapenem  1 g IV daily, to complete 5-day course 12/22: Day 3 of antibiotic  Discharge from penis 12/28/23: Patient reported this with paramedic after virtual encounter I called  patient twice at his mobile and home number listed and it went directly to vm. There was no pickup UA was positive for mucus and no indications of candida/yeast I suspect what he is seeing is mucus with sloughing of the ureters We will check for gonorrhea/chlamydia via urine and RPR  CAD (coronary artery disease) Aspirin  81 mg daily  S/P CABG x 4 Stable at this time Resumed home Coreg  3.125 mg p.o. twice daily with meals, valsartan  40 mg daily, aspirin  81 mg daily  Type 2 diabetes mellitus (HCC) Diet controlled  Anxiety Home duloxetine  40 mg daily resumed  Chronic systolic heart failure (HCC) Currently euvolemic at this time Furosemide  20 mg p.o. twice daily  Hypertension Home carvedilol  3.125 mg p.o. twice daily, furosemide  20 mg p.o. twice daily, valsartan  40 mg daily  DVT prophylaxis: ambulation as tolerated  Code Status: Full code Family Communication: No Disposition Plan: Anticipate discharge on 12/29/2023 Level of care: Hospital at Home Med-Surg  Consultants:  PT, OT, [facility]  Antimicrobials: 12/23: Day 4 of antibiotic, ertapenem  1 g IV daily to complete a 5-day course  Subjective:  At bedside, via video telemedicine encounter, patient was able to tell me his first and last name, his date of birth.  He reports he still having flank pain though it is a little bit more improved than he first came into the hospital.  He reports the Toradol  did not help him.  Furthermore, he states that these are the feelings that he has when he has a urinary tract infection.  He notes that his infection has not gone away.  Objective: Vitals:   12/27/23 1200 12/27/23 1838 12/28/23 1200 12/28/23 1255  BP: (!) 144/84 138/83  (!) 140/70  Pulse: 92 92 90   Resp: 18 18 16    Temp: 98.3 F (36.8 C) 98.2 F (36.8 C) 98 F (36.7 C)   TempSrc: Oral Oral Oral   SpO2: 99% 99% 98%    Examination was completed with the assistance of: Victoria Huffman, paramedic, who was present in the house  during the virtual encounter:  General exam: Appears calm and comfortable  Respiratory system: Clear to auscultation. Respiratory effort normal. Cardiovascular system: S1 & S2 heard, RRR. No murmurs. No pedal edema. Gastrointestinal system: Abdomen is nondistended, soft and nontender. No organomegaly or masses felt. Normal bowel sounds heard. Central nervous system: Alert and oriented. No focal neurological deficits. Extremities: Symmetric 5 x 5 power. Skin: No rashes, lesions or ulcers Psychiatry: Judgement and insight appear normal. Mood & affect appropriate.   Data Reviewed: I have personally reviewed following labs and imaging studies  CBC: Recent Labs  Lab 12/25/23 1426 12/26/23 0531  WBC 10.2 6.8  NEUTROABS 8.3*  --   HGB 16.0 13.9  HCT 48.1 41.3  MCV 99.2 97.4  PLT 172 145*   Basic Metabolic Panel: Recent Labs  Lab 12/25/23 1426 12/26/23 0531 12/28/23 1333  NA 137 137 138  K 4.2 3.9 3.9  CL 101 106 103  CO2 27 24 27   GLUCOSE 268* 171* 159*  BUN 17 17 18   CREATININE 1.01 0.69 0.79  CALCIUM 9.4 8.5* 8.8*   GFR: Estimated Creatinine Clearance: 109 mL/min (by C-G formula based on SCr of 0.79 mg/dL).  Liver Function Tests: Recent Labs  Lab 12/25/23 1426  AST 22  ALT 29  ALKPHOS 73  BILITOT 1.1  PROT 6.5  ALBUMIN  3.9   Recent Labs  Lab 12/25/23 1426  LIPASE 44   CBG: Recent Labs  Lab 12/25/23 1345 12/25/23 2222 12/26/23 0802 12/26/23 1204 12/26/23 1643  GLUCAP 254* 268* 159* 248* 240*   Sepsis Labs: Recent Labs  Lab 12/25/23 1515 12/25/23 1651  LATICACIDVEN 1.7 1.4   Recent Results (from the past 240 hours)  Urine Culture     Status: Abnormal   Collection Time: 12/25/23  2:37 PM   Specimen: Urine, Clean Catch  Result Value Ref Range Status   Specimen Description   Final    URINE, CLEAN CATCH Performed at [facility], [address], [place], [place] [address]    Special Requests   Final    NONE Performed at  [facility], [address] [place], [place] [address]    Culture (A)  Final    >=100,000 COLONIES/mL ESCHERICHIA COLI Confirmed Extended Spectrum Beta-Lactamase Producer (ESBL).  In bloodstream infections from ESBL organisms, carbapenems are preferred over piperacillin /tazobactam. They are shown to have a lower risk of mortality.    Report Status 12/27/2023 FINAL  Final   Organism ID, Bacteria ESCHERICHIA COLI (A)  Final      Susceptibility   Escherichia coli - MIC*    AMPICILLIN >=32 RESISTANT Resistant     CEFAZOLIN  (URINE) Value in next row Resistant      >=32 RESISTANTThis is a modified FDA-approved test that has been validated and its performance characteristics determined by the reporting laboratory.  This laboratory is certified under the Clinical Laboratory Improvement Amendments CLIA as qualified to perform high complexity clinical laboratory testing.    CEFEPIME Value in next row Resistant      >=32 RESISTANTThis  is a modified FDA-approved test that has been validated and its performance characteristics determined by the reporting laboratory.  This laboratory is certified under the Clinical Laboratory Improvement Amendments CLIA as qualified to perform high complexity clinical laboratory testing.    ERTAPENEM  Value in next row Sensitive      >=32 RESISTANTThis is a modified FDA-approved test that has been validated and its performance characteristics determined by the reporting laboratory.  This laboratory is certified under the Clinical Laboratory Improvement Amendments CLIA as qualified to perform high complexity clinical laboratory testing.    CEFTRIAXONE Value in next row Resistant      >=32 RESISTANTThis is a modified FDA-approved test that has been validated and its performance characteristics determined by the reporting laboratory.  This laboratory is certified under the Clinical Laboratory Improvement Amendments CLIA as qualified to perform high complexity  clinical laboratory testing.    CIPROFLOXACIN  Value in next row Resistant      >=32 RESISTANTThis is a modified FDA-approved test that has been validated and its performance characteristics determined by the reporting laboratory.  This laboratory is certified under the Clinical Laboratory Improvement Amendments CLIA as qualified to perform high complexity clinical laboratory testing.    GENTAMICIN  Value in next row Sensitive      >=32 RESISTANTThis is a modified FDA-approved test that has been validated and its performance characteristics determined by the reporting laboratory.  This laboratory is certified under the Clinical Laboratory Improvement Amendments CLIA as qualified to perform high complexity clinical laboratory testing.    NITROFURANTOIN  Value in next row Sensitive      >=32 RESISTANTThis is a modified FDA-approved test that has been validated and its performance characteristics determined by the reporting laboratory.  This laboratory is certified under the Clinical Laboratory Improvement Amendments CLIA as qualified to perform high complexity clinical laboratory testing.    TRIMETH /SULFA  Value in next row Resistant      >=32 RESISTANTThis is a modified FDA-approved test that has been validated and its performance characteristics determined by the reporting laboratory.  This laboratory is certified under the Clinical Laboratory Improvement Amendments CLIA as qualified to perform high complexity clinical laboratory testing.    AMPICILLIN/SULBACTAM Value in next row Resistant      >=32 RESISTANTThis is a modified FDA-approved test that has been validated and its performance characteristics determined by the reporting laboratory.  This laboratory is certified under the Clinical Laboratory Improvement Amendments CLIA as qualified to perform high complexity clinical laboratory testing.    PIP/TAZO Value in next row Sensitive      <=4 SENSITIVEThis is a modified FDA-approved test that has been  validated and its performance characteristics determined by the reporting laboratory.  This laboratory is certified under the Clinical Laboratory Improvement Amendments CLIA as qualified to perform high complexity clinical laboratory testing.    MEROPENEM  Value in next row Sensitive      <=4 SENSITIVEThis is a modified FDA-approved test that has been validated and its performance characteristics determined by the reporting laboratory.  This laboratory is certified under the Clinical Laboratory Improvement Amendments CLIA as qualified to perform high complexity clinical laboratory testing.    * >=100,000 COLONIES/mL ESCHERICHIA COLI  Blood culture (routine x 2)     Status: None (Preliminary result)   Collection Time: 12/25/23  4:51 PM   Specimen: BLOOD RIGHT FOREARM  Result Value Ref Range Status   Specimen Description   Final    BLOOD RIGHT FOREARM Performed at [facility], [address],  [place], [place] [address]    Special Requests   Final    BOTTLES DRAWN AEROBIC AND ANAEROBIC Blood Culture adequate volume Performed at [facility], [address] [place], [place] [address]    Culture   Final    NO GROWTH 2 DAYS Performed at [facility], [address] [place], [place] [address]    Report Status PENDING  Incomplete  Blood culture (routine x 2)     Status: None (Preliminary result)   Collection Time: 12/25/23  4:51 PM   Specimen: BLOOD LEFT FOREARM  Result Value Ref Range Status   Specimen Description   Final    BLOOD LEFT FOREARM Performed at [facility], [address], [place], [place] [address]    Special Requests   Final    BOTTLES DRAWN AEROBIC AND ANAEROBIC Blood Culture adequate volume Performed at [facility], [address] [place], [place] [address]    Culture   Final    NO GROWTH 2 DAYS Performed at [facility], [address], [place], [place] [address]    Report  Status PENDING  Incomplete    Scheduled Meds:  aspirin  EC  81 mg Oral q AM   carvedilol   3.125 mg Oral BID WC   DULoxetine   40 mg Oral Daily   furosemide   20 mg Oral BID   pantoprazole   40 mg Oral Daily   pregabalin   100 mg Oral QPM   valsartan   40 mg Oral Daily   zolpidem   10 mg Oral QHS   Continuous Infusions:  acetaminophen      ertapenem  Stopped (12/28/23 1325)    LOS: 2 days   Time spent: 52 minutes  Dr. Sherre Location: [place]  [facility] If 7PM-7AM, please contact night-coverage 12/28/2023, 3:36 PM  "

## 2023-12-28 NOTE — Progress Notes (Signed)
 Arrived to find the PT allowing personnel into his home through the garage. PT is Aox4 and acting to his baseline. PT is c./o back pain on the right flank side and is walking slightly hunched over. PT stated that he slept for 11.5 hours last night with a frozen water  bottle wrapped in a wool sock and placed with a securing band around his back. He self reports that helped tremendously last night.   Physical exam showed negative DCAPBTLS to the head, neck, or face. Right flank back is swollen and slightly tender to the touch. PT stated that he doesn't feel much of the pain while he is sitting, however when he is trying to be active is when he feels the most amount of pain. Pupils are equal and reactive to light. PT reports that he had blurry vision yesterday in his left eye and today it has gotten slightly better. He describes it now that people are not shadows but are more of a define outline. Negative JVD or tracheal deviation noted. Chest expansion is equal with non-labored breathing. PT has AICD on left side of chest. ABD is soft, non-tender upon palpation. PT stated that his urine color and smell has improved since he was initially admitted. However, he stated that he has been having penile discharge from the meatus. PT stated that he will clean himself and shower and he will still see it later in the day. PT denied any polyuria or hematuria. Negative pedal or peripheral edema noted.   All medications were administered as prescribed. Initial IV was d/c due to leaking, a new peripheral IV site was established. Lab work was drawn.   PT had virtual MD visit with Dr. Sherre. He stated that he wanted today (12/23) and tomorrow (12/24) to have IV abx as well as the IV Tylenol  that MD Cox had suggested for pain. Dr. Sherre was agreeable to the plan.   PT denied having any other questions and was reminded that if he needed anything further to contact the virtual RN.

## 2023-12-28 NOTE — Progress Notes (Addendum)
 Arrived to the PT's house for PM visit. PT greeted personnel at the garage door. PT is Aox4 and oriented to his baseline. PT stated that he is still feeling some pain and c/o feeling groggy due to taking his cymbalta  in the morning. He said that he normally takes it at night because of how drowsy it makes him. PT stated that he is having 6/10 back pain on this right flank.   All medications were administered as prescribed. IV tylenol  was administered as a new medication. PT stated that he has been relaxing throughout the day and hasn't had an increase of pain greater than a 6 when he is moving.   UA and blood work were obtained. IMM letter was explained and signed.   PT denied having any other questions and was reminded that if he needed anything to call the virtual RN.

## 2023-12-28 NOTE — Assessment & Plan Note (Addendum)
 12/28/23 Dr. Sherre: Patient reported this with paramedic after virtual encounter I called patient twice at his mobile and home number listed and it went directly to vm. There was no pickup UA was positive for mucus and no indications of candida/yeast I suspect what he is seeing is mucus with sloughing of the ureters  Negative gonorrhea/chlamydia via urine and RPR  12/24--25 - discharge reportedly improved, also suspect was passing mucus.  Now resolved.

## 2023-12-28 NOTE — Progress Notes (Signed)
 The pt was seen today for a evening hospital at home visit. The pt was alert and oriented x4. His skin was warm,dry and appropriate in color.He had a strong radial pulse and was breathing normally. The pt states that he just finish taking his night time meds with the nurse. When asked how his pain was doing he stated it was worse upon movement but other wise well controlled. The pt was given iv Toradol  as prescribed by the MD.He was asked if he needed anything else for the night and he denied same. The pt was informed to call back if anything were to change and he agreed to same.

## 2023-12-28 NOTE — Progress Notes (Signed)
 Completed virtual rounds with MD,paramedic at patient bedside. POC reviewed and discussed ,patient voices understanding and agreement. Pt reminded to call RN for any needs, RN and MD available at all times. Pt voices understanding. Pt aware of next planned visit and next call from RN.

## 2023-12-28 NOTE — Progress Notes (Signed)
 Attempted to reach pt via phone to assist with am meds and update on timing of paramedic visit,no answer via phone or ipad. Current health does show patient is moving and VSS

## 2023-12-28 NOTE — Progress Notes (Signed)
 IMM signed 12/28/23

## 2023-12-29 DIAGNOSIS — R369 Urethral discharge, unspecified: Secondary | ICD-10-CM

## 2023-12-29 LAB — SYPHILIS: RPR W/REFLEX TO RPR TITER AND TREPONEMAL ANTIBODIES, TRADITIONAL SCREENING AND DIAGNOSIS ALGORITHM: RPR Ser Ql: NONREACTIVE

## 2023-12-29 MED ORDER — ZOLPIDEM TARTRATE 10 MG PO TABS: 10.0000 mg | ORAL_TABLET | Freq: Every day | ORAL | 0 refills | Status: DC

## 2023-12-29 MED ORDER — ACETAMINOPHEN 500 MG PO TABS
1000.0000 mg | ORAL_TABLET | Freq: Four times a day (QID) | ORAL | Status: DC | PRN
  Administered 2023-12-29 – 2023-12-30 (×3): 1000 mg via ORAL
  Filled 2023-12-29 (×7): qty 2

## 2023-12-29 MED ORDER — PREGABALIN 50 MG PO CAPS: 100.0000 mg | ORAL_CAPSULE | Freq: Every evening | ORAL | 0 refills | Status: AC

## 2023-12-29 NOTE — Progress Notes (Signed)
 "  Hospital at Home Daily Progress Note   Patient: [name]  MRN: [id number]  DOB: 11-21-1955  DOA: 12/25/2023  DOS: the patient was seen and examined on @TODAY @    Patient identified themself as [name]  DOB 24-Aug-1955  Medic Richerd Batty present in the home during encounter and performed the assessment and physical exam.  Patient was seen today via video conference; my physical location [place], [place].   Brief hospital course:  [name] is a 68 year old male with history of heart failure reduced ejection fraction, status post defibrillator status, hypertension, CAD status post CABG, rheumatoid arthritis, GERD, insomnia, history of ESBL UTI.  12/25/2023: Patient presents to the emergency department for chief concerns of flank pain, right greater than left.  12/25/2023: Patient admitted to hospitalist service for chief concerns of ESBL UTI.  Meropenem  was initiated.  12/26/2023: Day 2 of abx. Patient care transferred to hospital at home service.   12/22: I assumed care of patient. Day 3 of abx. Urine culture positive sensitivity/specificity resulted.  Will continue with ertapenem  given no good p.o. alternative.  Toradol  15 mg IV twice daily as needed for moderate pain, 2 days ordered.  12/23: Day 4 of antibiotic. Patient reports Toradol  did not help with his pain.  Though his pain overall is much better than when he first was in the hospital.  He reports he feels like he still having infection and would like to complete 5-day course of ertapenem .  IV Tylenol  for pain control given as Toradol  reportedly unhelpful.  12/24: Day 5 antibiotic. Pt with ongoing significant flank pain, urinary frequency and urgency.        Assessment and Plan:  * UTI due to extended-spectrum beta lactamase (ESBL) producing Escherichia coli Ertapenem  1 g IV daily, to complete 5-day course minimum 12/24: Day 5 of antibiotic - continue given ongoing symptom burden with flank pain, urinary  frequency and severe urgency  Discharge from penis 12/28/23 Dr. Sherre: Patient reported this with paramedic after virtual encounter I called patient twice at his mobile and home number listed and it went directly to vm. There was no pickup UA was positive for mucus and no indications of candida/yeast I suspect what he is seeing is mucus with sloughing of the ureters  --Follow pending gonorrhea/chlamydia via urine and RPR  12/24 - discharge reportedly improved, also suspect was passing mucus  CAD (coronary artery disease) Aspirin  81 mg daily  S/P CABG x 4 Stable at this time Resumed home Coreg  3.125 mg p.o. twice daily with meals, valsartan  40 mg daily, aspirin  81 mg daily  Seropositive rheumatoid arthritis of multiple sites (HCC) stable  Type 2 diabetes mellitus (HCC) Diet controlled  Anxiety Home duloxetine  40 mg daily resumed  Chronic systolic heart failure (HCC) Currently euvolemic at this time Furosemide  20 mg p.o. twice daily  Back pain, chronic Currently complicated by acute right-sided flank pain  Hypertension Home carvedilol  3.125 mg p.o. twice daily, furosemide  20 mg p.o. twice daily, valsartan  40 mg daily     Patient Active Problem List   Diagnosis Date Noted   Discharge from penis 12/28/2023    Priority: Medium    CAD (coronary artery disease) 05/14/2010    Priority: Medium    S/P CABG x 4 06/14/2017    Priority: Low   Hypercholesterolemia     Priority: Low   UTI due to extended-spectrum beta lactamase (ESBL) producing Escherichia coli 12/25/2023   Type 2 diabetes mellitus (HCC) 12/25/2023  Seropositive rheumatoid arthritis of multiple sites (HCC) 12/25/2023   Contact dermatitis 08/26/2022   Osteoarthritis of right knee 12/17/2021   Acute on chronic systolic CHF (congestive heart failure), NYHA class 3 (HCC) 06/23/2021   Low testosterone  03/10/2021   Lumbar back pain 10/03/2020   Pseudoarthrosis of lumbar spine 10/03/2020   Unstable angina (HCC)  09/06/2020   Encounter for health maintenance examination in adult 09/03/2020   Encounter for screening for malignant neoplasm of colon 09/03/2020   Impaired fasting blood sugar 09/03/2020   Fatigue 09/03/2020   Screening for prostate cancer 09/03/2020   History of gout 09/03/2020   Medicare welcome exam 09/03/2020   Insomnia 01/11/2020   Back pain 01/11/2020   History of urinary tract infection 01/11/2020   Vaccine counseling 01/11/2020   Rumination 01/11/2020   Anxiety 01/11/2020   Chronic diarrhea 12/05/2019   Food intolerance 12/05/2019   Abdominal cramping 12/05/2019   Abdominal bloating 12/05/2019   Need for influenza vaccination 12/05/2019   Right knee pain 11/03/2019   Shoulder pain, acute 06/09/2019   Hip pain 02/24/2019   Knee pain 02/24/2019   Bulbous urethral stricture 10/15/2017   Personal history of urethral stricture 08/10/2017   Chronic systolic heart failure (HCC) 06/22/2017   ACS (acute coronary syndrome) (HCC) 06/08/2017   Angina pectoris    Gout 08/30/2014   Neck pain, chronic 12/17/2010   Back pain, chronic 12/01/2010   Pericarditis 10/02/2010   Hypertension    LBBB (left bundle branch block)    NSTEMI (non-ST elevated myocardial infarction) (HCC)       Significant Events: Admitted 12/25/2023 Transferred to Hospital at Home program 12/26/2023    Subjective / Interval 24 hour History:   Pt seen virtually during AM medic visit today. He reports ongoing right flank/back pain currently 5-6 out of 10, was worse, but gets as bad as a 10 at times such as earlier when having movement in the bathroom.  He denies dysuria or further hematuria (noted in once only), but does still have a lot of frequency and urgency.  He states with prior UTI's these have typically improved at end of antibiotic course.  No fever/chills.  IV Tylenol  helped a little with his pain, minor soothing   Left eye blurry vision - he reports was worse last night, seems worse when  fatigued. He describes his central vision in left eye only looking dark / black but can see colors in periphery of the vision.  No changes on right.  He reports onset Sunday.  Reports a history of migraines, but no vision symptoms like this related to past migraines.  Has no pain with eye movement, some pain behind the eye today.  He reports plan to call and schedule an ASAP appointment with his eye doctor and has been seen quickly for acute changes in the past.       Antibiotic Therapy: Anti-infectives (From admission, onward)    Start     Dose/Rate Route Frequency Ordered Stop   12/26/23 1200  ertapenem (INVANZ) 1 g in sodium chloride 0.9 % 100 mL IVPB        1 g 200 mL/hr over 30 Minutes Intravenous Every 24 hours 12/26/23 0951     12 /20/25 2200  meropenem  (MERREM ) 1 g in sodium chloride  0.9 % 100 mL IVPB  Status:  Discontinued        1 g 200 mL/hr over 30 Minutes Intravenous Every 8 hours 12/25/23 2039 12/26/23 0951   12/25/23 1645  piperacillin -tazobactam (  ZOSYN ) IVPB 3.375 g  Status:  Discontinued        3.375 g 12.5 mL/hr over 240 Minutes Intravenous  Once 12/25/23 1630 12/26/23 0900       Procedures:   Consultants:           Physical Exam:    12/29/2023   11:00 AM 12/29/2023   10:30 AM 12/28/2023    7:22 PM  Vitals with BMI  Height   6' 1  Weight   218 lbs  BMI   28.77  Systolic 150 150 880  Diastolic 80 80 64  Pulse  83 83      General exam: awake, alert, no acute distress HEENT: voice clear, hearing grossly normal  Respiratory system: CTAB, normal respiratory effort. Cardiovascular system: normal S1/S2, RRR, ICD in place Gastrointestinal system: soft, NT, +bowel sounds. Central nervous system: A&O x 3. no gross focal neurologic deficits, normal speech Extremities: moves all, no edema Psychiatry: normal mood, congruent affect, judgement and insight appear normal    Data Reviewed:  Notable labs -- pending today   Family Communication: wife  was present in home during virtual encounter  Disposition: Status is: Inpatient Remains inpatient appropriate because: remains on IV antibiotics pending further clinical improvement given ongoing symptoms   Planned Discharge Destination: Home    Time spent: 45 minutes  Author: Burnard DELENA Cunning, DO [facility]  08/26/2023 7:00 AM  For on call review [email or web address].   "

## 2023-12-29 NOTE — Progress Notes (Signed)
 Video visit with [name] and Victoria, paramedic. [name] Gossman reports that the IV tylenol  is already helping his flank pain. He has no concerns this evening. We discuss his medical history and encourage follow up with cardiologist as planned. We discussed his medications and I answered all questions.

## 2023-12-29 NOTE — Assessment & Plan Note (Signed)
 stable

## 2023-12-29 NOTE — Progress Notes (Signed)
 Called patient for evening meds prefers to wait till 2nd medic visit. Reports good pain reduction with PO tylenol . Admit provider is getting Rx done for Lyrica . Evening medic will transport   Dual sign with medic for cymbalta  40mg .

## 2023-12-29 NOTE — Progress Notes (Signed)
 Video visit for HS medications.

## 2023-12-29 NOTE — Progress Notes (Signed)
 Arrived to the PT's house where his wife informed me that he was getting dressed after showering. PT was at the top of the stairs and had minimal difficulty getting down the stairs but stated that he is having a large amount of pain when he firsts gets up into a standing position. PT sat down on the couch without incident. He stated that he didn't have much pain last night and was able to sleep through the night.   PT's vitals were obtained and documented accordingly. Physical exam showed negative DCAPBTLS to the head, neck or face. Pupils are equal and reactive to light. PT is still having blurred vision in his left eye that has not resolved but has also not gotten better. PT denied any other visual changes or disturbances. Negative JVD or tracheal deviation. Chest expansion is equal with non-labored breathing. ABD is soft, non-tender in all quadrants and he had a BM this morning. PT stated that his urine is becoming more yellow in appearance and clear. He denied any foul odor with his urine. PT stated that the mucus/discharge that he noticed on the meatus of his penis is starting to clear up as well. PT denied any burning with urination. Negative pedal or peripheral edema.   PT had virtual visit with DO Fausto. PT is staying on the [facility] service with possible d/c on 12/30/23.   All medications were administered as prescribed. PT and wife denied having any other questions and were reminded to call the virtual RN if they had any other questions.

## 2023-12-29 NOTE — Progress Notes (Signed)
 Virtual rounds with patient, field team, provider, and virtual RN patient c/o back pain using cold compress to sleep, pain is currently 6/10. Patient ti transitioned to PO tylenol  1,000 mg for pain.   Dual signed for Valsartan  40mg .  Wt stable at 218.1 Patient reports good pain relief with PO tylenol .

## 2023-12-29 NOTE — Progress Notes (Signed)
 Spoke with patient on phone regarding medication drop off from team. Plan for the evening reviewed. Patient made aware to call RN if needed anytime overnight. Patient denies any increase in pain or other needs at this time.

## 2023-12-29 NOTE — Plan of Care (Signed)
" °  Problem: Education: Goal: Knowledge of General Education information will improve Description: Including pain rating scale, medication(s)/side effects and non-pharmacologic comfort measures Outcome: Progressing   Problem: Health Behavior/Discharge Planning: Goal: Ability to manage health-related needs will improve Outcome: Progressing   Problem: Clinical Measurements: Goal: Ability to maintain clinical measurements within normal limits will improve Outcome: Progressing   Problem: Activity: Goal: Risk for activity intolerance will decrease Outcome: Progressing   Problem: Nutrition: Goal: Adequate nutrition will be maintained Outcome: Progressing   Problem: Coping: Goal: Level of anxiety will decrease Outcome: Progressing   Problem: Pain Managment: Goal: General experience of comfort will improve and/or be controlled Outcome: Progressing   Problem: Safety: Goal: Ability to remain free from injury will improve Outcome: Progressing   Problem: Urinary Elimination: Goal: Signs and symptoms of infection will decrease Outcome: Progressing   "

## 2023-12-29 NOTE — Plan of Care (Signed)
 Patient took evening meds with medic well tolerated per patient and discussed improvement in positioning to decrease back pressure using pillows. Patient also   Problem: Education: Goal: Knowledge of General Education information will improve Description: Including pain rating scale, medication(s)/side effects and non-pharmacologic comfort measures Outcome: Progressing   Problem: Health Behavior/Discharge Planning: Goal: Ability to manage health-related needs will improve Outcome: Progressing   Problem: Clinical Measurements: Goal: Will remain free from infection Outcome: Progressing Goal: Cardiovascular complication will be avoided Outcome: Progressing   Problem: Activity: Goal: Risk for activity intolerance will decrease Outcome: Progressing   Problem: Pain Managment: Goal: General experience of comfort will improve and/or be controlled Outcome: Progressing Note: PO tylenol  1,000 mg dose providing relief

## 2023-12-29 NOTE — Assessment & Plan Note (Signed)
 Currently complicated by acute right-sided flank pain

## 2023-12-30 ENCOUNTER — Encounter

## 2023-12-30 MED ORDER — ZOLPIDEM TARTRATE 10 MG PO TABS: 10.0000 mg | ORAL_TABLET | Freq: Every day | ORAL | Status: DC

## 2023-12-30 MED ORDER — KETOROLAC TROMETHAMINE 15 MG/ML IJ SOLN
15.0000 mg | Freq: Once | INTRAMUSCULAR | Status: AC
  Administered 2023-12-30: 15 mg via INTRAVENOUS

## 2023-12-30 MED ORDER — KETOROLAC TROMETHAMINE 10 MG PO TABS: 10.0000 mg | ORAL_TABLET | Freq: Four times a day (QID) | ORAL | 0 refills | Status: AC | PRN

## 2023-12-30 MED ORDER — ZOLPIDEM TARTRATE 10 MG PO TABS: 10.0000 mg | ORAL_TABLET | Freq: Every day | ORAL | Status: AC

## 2023-12-30 NOTE — TOC Transition Note (Signed)
 Transition of Care [facility]) - Discharge Note   Patient Details  Name: [name] MRN: [id number] Date of Birth: [birth year]  [facility] CM/SW Contact:  Debarah Saunas, RN Phone Number: 12/30/2023, 7:56 AM   Clinical Narrative:    Plan to discharge today.  No additional ICM needs identified at this time.   Final next level of care: Home/Self Care Barriers to Discharge: Continued Medical Work up   Patient Goals and CMS Choice Patient states their goals for this hospitalization and ongoing recovery are:: remain at home with spouse, self care                               Discharge Plan and Services Additional resources added to the After Visit Summary for                                       Social Drivers of Health (SDOH) Interventions SDOH Screenings   Food Insecurity: No Food Insecurity (12/25/2023)  Housing: Low Risk (12/25/2023)  Transportation Needs: No Transportation Needs (12/25/2023)  Utilities: Not At Risk (12/25/2023)  Depression (PHQ2-9): Low Risk (07/14/2022)  Financial Resource Strain: Low Risk (01/09/2021)  Social Connections: Socially Integrated (12/25/2023)  Tobacco Use: Medium Risk (12/26/2023)     Readmission Risk Interventions    12/30/2023    7:56 AM  Readmission Risk Prevention Plan  Post Dischage Appt Complete  Medication Screening Complete  Transportation Screening Complete

## 2023-12-30 NOTE — Progress Notes (Signed)
 Completed virtual rounds with MD,paramedic at patient bedside. POC reviewed and discussed ,patient voices understanding and agreement. Pt reminded to call RN for any needs, RN and MD available at all times. Pt voices understanding. Pt aware of next planned visit and next call from RN.

## 2023-12-30 NOTE — Progress Notes (Signed)
 Spoke with patient via phone to updated on prescription sent for toradol  to his pharmacy on record and to hold aspirin  while taking the medication.Pt verbalized understanding.

## 2023-12-30 NOTE — Discharge Summary (Signed)
 "  [facility] Physician Discharge Summary   Patient: [name] MRN: [id number] DOB: 1955/12/11  Admit date:     12/25/2023  Discharge date: 12/30/2023  Discharge Physician: Burnard DELENA Cunning   PCP: Emerick Avelina POUR, PA-C   Patient identified themself as [name]  DOB [birth year]  Medic Lauraine Faes present in the home during encounter and performed the physical exam and assessment.  Patient was seen today via video chat; my physical location [facility] [place]   Recommendations at discharge:  {Tip this will not be part of the note when signed- Example include specific recommendations for outpatient follow-up, pending tests to follow-up on. (Optional):26781} Follow up with Primary Care in 1-2 weeks Repeat CBC, CMP at follow up Follow up with urology regarding recurrent UTI's Follow up with Ophthalmology as soon as possible for dilated retinal exam     Discharge Diagnoses: Principal Problem:   UTI due to extended-spectrum beta lactamase (ESBL) producing Escherichia coli Active Problems:   CAD (coronary artery disease)   Discharge from penis   Hypercholesterolemia   S/P CABG x 4   Hypertension   Back pain, chronic   Chronic systolic heart failure (HCC)   Anxiety   Type 2 diabetes mellitus (HCC)   Seropositive rheumatoid arthritis of multiple sites (HCC)  Resolved Problems:   Elevated serum creatinine  Hospital Course: [name] is a 68 year old male with history of heart failure reduced ejection fraction, status post defibrillator status, hypertension, CAD status post CABG, rheumatoid arthritis, GERD, insomnia, history of ESBL UTI.  12/25/2023: Patient presents to the emergency department for chief concerns of flank pain, right greater than left.  12/25/2023: Patient admitted to hospitalist service for chief concerns of ESBL UTI.  Meropenem  was initiated.  12/26/2023: Day 2 of abx. Patient care transferred to hospital at home service.   12/22: I assumed  care of patient. Day 3 of abx. Urine culture positive sensitivity/specificity resulted.  Will continue with ertapenem  given no good p.o. alternative.  Toradol  15 mg IV twice daily as needed for moderate pain, 2 days ordered.  12/23: Day 4 of antibiotic. Patient reports Toradol  did not help with his pain.  Though his pain overall is much better than when he first was in the hospital.  He reports he feels like he still having infection and would like to complete 5-day course of ertapenem .  IV Tylenol  for pain control given as Toradol  reportedly unhelpful.  12/24: Day 5 antibiotic. Pt with ongoing significant flank pain, urinary frequency and urgency.    12/25 -- pt continues to have some flank pain but otherwise is overall improved.  I discussed the case with infectious disease again, and they indicated adequate antibiotics have been given.    Patient is medically stable and agreeable for discharge today   Assessment and Plan: * UTI due to extended-spectrum beta lactamase (ESBL) producing Escherichia coli Ertapenem  1 g IV daily, to complete 5-day course minimum 12/24: Day 5 of antibiotic - continue given ongoing symptom burden with flank pain, urinary frequency and severe urgency 12/25 - antibiotics completed, none further recommended by ID. Case reviewed and discussed.  --Follow up with urology --Pain control PRN for flank pain  Discharge from penis 12/28/23 Dr. Sherre: Patient reported this with paramedic after virtual encounter I called patient twice at his mobile and home number listed and it went directly to vm. There was no pickup UA was positive for mucus and no indications of candida/yeast I suspect what he  is seeing is mucus with sloughing of the ureters  Negative gonorrhea/chlamydia via urine and RPR  12/24--25 - discharge reportedly improved, also suspect was passing mucus.  Now resolved.  CAD (coronary artery disease) Aspirin  81 mg daily  S/P CABG x 4 Stable at this  time Resumed home Coreg  3.125 mg p.o. twice daily with meals, valsartan  40 mg daily, aspirin  81 mg daily  Seropositive rheumatoid arthritis of multiple sites (HCC) stable  Type 2 diabetes mellitus (HCC) Diet controlled  Anxiety Home duloxetine  40 mg daily resumed  Chronic systolic heart failure (HCC) Currently euvolemic at this time Furosemide  20 mg p.o. twice daily  Back pain, chronic Currently complicated by acute right-sided flank pain  Hypertension Home carvedilol  3.125 mg p.o. twice daily, furosemide  20 mg p.o. twice daily, valsartan  40 mg daily      {Tip this will not be part of the note when signed Body mass index is 28.76 kg/m. , ,  (Optional):26781}  {(NOTE) Pain control PDMP Statment (Optional):26782} Consultants: none Procedures performed:  Disposition: home  Diet recommendation:  Cardiac and Carb modified diet  DISCHARGE MEDICATION: Allergies as of 12/30/2023       Reactions   Niacin Other (See Comments)   Drops BP and severe joint/muscle pain   Statins Other (See Comments)   Severe joint pain and drops BP. Other Reaction(s): Unknown   Zetia [ezetimibe] Other (See Comments)   Joint pain and drops BP   Bactrim  [sulfamethoxazole -trimethoprim ] Other (See Comments)   Blurry vision, slurred speech, brain fog        Medication List     STOP taking these medications    methylPREDNISolone  4 MG Tbpk tablet Commonly known as: MEDROL  DOSEPAK   ondansetron  8 MG disintegrating tablet Commonly known as: ZOFRAN -ODT   oxyCODONE  5 MG immediate release tablet Commonly known as: Roxicodone    sulfamethoxazole -trimethoprim  800-160 MG tablet Commonly known as: BACTRIM  DS       TAKE these medications    acetaminophen  500 MG tablet Commonly known as: TYLENOL  Take 1,000 mg by mouth 3 (three) times daily. Pt. Reports he is taking half What changed: when to take this   AIRBORNE GUMMIES PO Take 3 tablets by mouth 2 (two) times daily.    multivitamin with minerals tablet Take 1 tablet by mouth daily. Mega men multivitamin pak   aspirin  EC 81 MG tablet Take 81 mg by mouth in the morning.   carvedilol  6.25 MG tablet Commonly known as: COREG  TAKE 1 TABLET BY MOUTH 2 TIMES DAILY WITH A meal ** must have office visit FOR addtional refills** What changed: See the new instructions.   cetirizine 10 MG tablet Commonly known as: ZYRTEC Take 10 mg by mouth daily as needed for allergies.   cyclobenzaprine  10 MG tablet Commonly known as: FLEXERIL  Take 1 tablet (10 mg total) by mouth 2 (two) times daily as needed for muscle spasms.   Deep Sleep Caps Take 2 capsules by mouth at bedtime.   DULoxetine  20 MG capsule Commonly known as: CYMBALTA  Take 40 mg by mouth daily.   folic acid  1 MG tablet Commonly known as: FOLVITE  Take 1 mg by mouth daily.   furosemide  40 MG tablet Commonly known as: LASIX  TAKE 1/2 TABLET BY MOUTH ONCE DAILY   nitroGLYCERIN  0.4 MG SL tablet Commonly known as: NITROSTAT  Place 1 tablet (0.4 mg total) under the tongue every 5 (five) minutes x 3 doses as needed for chest pain.   OSTEO BI-FLEX REGULAR STRENGTH PO Take 1 tablet by  mouth 2 (two) times daily.   pantoprazole  40 MG tablet Commonly known as: PROTONIX  TAKE 1 TABLET BY MOUTH EVERY DAY   pregabalin  50 MG capsule Commonly known as: LYRICA  Take 2 capsules (100 mg total) by mouth every evening.   Repatha  SureClick 140 MG/ML Soaj Generic drug: Evolocumab  inject into THE SKIN EVERY 14 DAYS What changed: See the new instructions.   valsartan  40 MG tablet Commonly known as: DIOVAN  TAKE 1 TABLET BY MOUTH ONCE DAILY   zolpidem  10 MG tablet Commonly known as: AMBIEN  Take 1 tablet (10 mg total) by mouth at bedtime. What changed: See the new instructions.       ASK your doctor about these medications    ketorolac  10 MG tablet Commonly known as: TORADOL  Take 1 tablet (10 mg total) by mouth every 6 (six) hours as needed for up to 4  days for moderate pain (pain score 4-6) or severe pain (pain score 7-10). Ask about: Should I take this medication?        Follow-up Information     Cam Morene ORN, MD. Call.   Specialty: Urology Why: Please call to schedule follow up with Urology to discuss recurrent UTI's Contact information: [address] [place] [address] [phone]         Emerick Avelina POUR, PA-C. Schedule an appointment as soon as possible for a visit.   Specialty: Physician Assistant Why: Hospital follow up in 1-2 weeks. Contact information: [address] [place] [address] [phone]                Discharge Exam: Fredricka Weights   12/28/23 1922  Weight: 98.9 kg   General exam: awake, alert, no acute distress HEENT: atraumatic, clear conjunctiva, anicteric sclera, ***moist mucus membranes, hearing grossly normal  Respiratory system: CTAB***, no wheezes, rales or rhonchi, normal respiratory effort. Cardiovascular system: normal S1/S2, *** RRR, no JVD, murmurs, rubs, gallops, *** no pedal edema.   Gastrointestinal system: soft, NT, ND, no HSM felt, +bowel sounds. Central nervous system: A&O x***. no gross focal neurologic deficits, normal speech Extremities: moves all ***, no edema, normal tone Skin: dry, intact, normal temperature, normal color, ***No rashes, lesions or ulcers Psychiatry: normal mood, congruent affect, judgement and insight appear normal   Condition at discharge: {DC Condition:26389}  The results of significant diagnostics from this hospitalization (including imaging, microbiology, ancillary and laboratory) are listed below for reference.   Imaging Studies: CUP PACEART REMOTE DEVICE CHECK Result Date: 01/02/2024 ICD: Scheduled remote reviewed. Normal device function.  Presenting rhythm: AS/BiVP Abnormal HF diagnoatics this monitoring period Next remote transmission per protocol. ML,  CVRS  CT Head Wo Contrast Result Date:  12/25/2023 CLINICAL DATA:  Blurry vision fall EXAM: CT HEAD WITHOUT CONTRAST TECHNIQUE: Contiguous axial images were obtained from the base of the skull through the vertex without intravenous contrast. RADIATION DOSE REDUCTION: This exam was performed according to the departmental dose-optimization program which includes automated exposure control, adjustment of the mA and/or kV according to patient size and/or use of iterative reconstruction technique. COMPARISON:  MRI report 08/22/2000 FINDINGS: Brain: No acute territorial infarction, hemorrhage or intracranial mass. Mild atrophy. Mild white matter hypodensity likely chronic small vessel ischemic change. The ventricles are nonenlarged Vascular: No hyperdense vessels.  Carotid vascular calcification Skull: Normal. Negative for fracture or focal lesion. Sinuses/Orbits: Fluid level right maxillary sinus with mucosal thickening. Moderate mucosal thickening in the ethmoid sinuses Other: None IMPRESSION: 1. No CT evidence for acute intracranial abnormality. 2.  Mild atrophy and chronic small vessel ischemic changes of the white matter. 3. Right maxillary sinus disease with fluid level, possible acute sinusitis. Electronically Signed   By: Luke Bun M.D.   On: 12/25/2023 16:03   CT ABDOMEN PELVIS W CONTRAST Result Date: 12/25/2023 CLINICAL DATA:  Abdominal pain EXAM: CT ABDOMEN AND PELVIS WITH CONTRAST TECHNIQUE: Multidetector CT imaging of the abdomen and pelvis was performed using the standard protocol following bolus administration of intravenous contrast. RADIATION DOSE REDUCTION: This exam was performed according to the departmental dose-optimization program which includes automated exposure control, adjustment of the mA and/or kV according to patient size and/or use of iterative reconstruction technique. CONTRAST:  OMNIPAQUE  IOHEXOL  300 MG/ML  SOLN COMPARISON:  CT 12/16/2023, 05/04/2023, 05/15/2021, 07/31/2020 FINDINGS: Lower chest: Lung bases  demonstrate chronic small left pleural effusion with mild rim thickening and adjacent chronic round atelectasis in the left lower lobe. Subpleural blebs in the right lower lobe. Lower sternotomy. Cardiomegaly and partially visualized cardiac pacing leads Hepatobiliary: No focal liver abnormality is seen. No gallstones, gallbladder wall thickening, or biliary dilatation. Pancreas: Unremarkable. No pancreatic ductal dilatation or surrounding inflammatory changes. Spleen: Normal in size without focal abnormality. Adrenals/Urinary Tract: Adrenal glands are normal. Chronic moderate nonspecific perinephric stranding. No hydronephrosis. Prominence of the ureters without obstructing stone. Bladder is unremarkable Stomach/Bowel: Stomach within normal limits. No dilated small bowel. No acute bowel wall thickening. Diverticular disease of the left colon. Vascular/Lymphatic: Aortic atherosclerosis. No enlarged abdominal or pelvic lymph nodes. Reproductive: Prostate is unremarkable. Other: Negative for ascites or free air. Small fat containing left inguinal hernia Musculoskeletal: Chronic compression fracture at T12. Chronic superior endplate deformity at L3. Mild inferior endplate deformity at L2, new compared with April CT. Posterior spinal fusion hardware L4 through upper sacrum with bilateral sacroiliac screws. Retained screws at S1 segment as before. Grade 1 anterolisthesis L5 on S1. Trace retrolisthesis L3 on L4. IMPRESSION: 1. No CT evidence for acute intra-abdominal or pelvic abnormality. 2. Chronic nonspecific perinephric fat stranding. Slight prominence of the ureters but no hydronephrosis and no obstructing stone 3. Diverticular disease of the left colon without acute inflammatory process. 4. Chronic small left pleural effusion with adjacent chronic round atelectasis in the left lower lobe. 5. Mild inferior endplate deformity at L2, new compared with lumbar CT from April but short-term stability compared with December  11 exam. 6. Aortic atherosclerosis. Aortic Atherosclerosis (ICD10-I70.0). Electronically Signed   By: Luke Bun M.D.   On: 12/25/2023 16:00   CUP PACEART INCLINIC DEVICE CHECK Result Date: 12/21/2023 Patient brought in to device clinic acutely d/t TWOS w/ inappropriate shock. Device interrogation & reprogramming changes made w/ Leontine, MDT Representative. Discussed w/ Dr. Cindie and the following programming changes are stated below to cover up TWOS.  Programming Changes: - V-V Pace Delay reprogrammed from 80ms to 40ms. - RV sensitivity reprogrammed from 0.81mV to 0.14mV. - EGM 3 Source reprogrammed from LVtip to RVcoil to RVtip to RVring. After reprogramming changes were made d/t TWOS, sensing, impedance and threshold tests were run. LV lead noted to be an epicardial lead. LV threshold test result 3.75V @ 1.69ms today. See programming changes below per Charlies Arthur, PA. Scheduled to F/U w/ Dr. Kennyth on 01/25/2023 @ 2:30pm for device optimization w/ MDT Rep to be present. Programming Changes: - LV amplitude reprogrammed from 2.5V @ 1.42ms to 4.25V @ 1.43ms. - LV capture management reprogrammed from Monitor to OFF. - EffectivCRT During AF reprogrammed from ON to OFF.Rozelle Banter, BSN, RN  CT Renal Stone Study Result Date: 12/16/2023 CLINICAL DATA:  Abdomen pain flank pain EXAM: CT ABDOMEN AND PELVIS WITHOUT CONTRAST TECHNIQUE: Multidetector CT imaging of the abdomen and pelvis was performed following the standard protocol without IV contrast. RADIATION DOSE REDUCTION: This exam was performed according to the departmental dose-optimization program which includes automated exposure control, adjustment of the mA and/or kV according to patient size and/or use of iterative reconstruction technique. COMPARISON:  Lumbar CT 05/04/2023, CT abdomen pelvis 05/15/2021 FINDINGS: Lower chest: Lung bases demonstrate small chronic left-sided pleural effusion with slightly thickened rim. Chronic ovoid consolidation at left  lower lobe consistent with round atelectasis. Mild emphysema. Cardiomegaly and coronary vascular calcification. Partially visualized cardiac pacing leads. Hepatobiliary: No focal liver abnormality is seen. No gallstones, gallbladder wall thickening, or biliary dilatation. Pancreas: Unremarkable. No pancreatic ductal dilatation or surrounding inflammatory changes. Spleen: Normal in size without focal abnormality. Adrenals/Urinary Tract: Adrenal glands are normal. Nonspecific perinephric stranding. No hydronephrosis. The bladder is unremarkable Stomach/Bowel: The stomach is within normal limits. No dilated small bowel. No acute bowel wall thickening. Large stool burden. Vascular/Lymphatic: Mild diverticular disease of the left colon without acute wall thickening Reproductive: Negative prostate Other: Negative for ascites or free air. Small fat containing left inguinal hernia Musculoskeletal: Chronic compression fracture T12. Chronic superior endplate deformity L3. mild inferior endplate deformity at L2 with minimal sclerosis, approximate 20% loss of height anteriorly of the vertebral body. This is new compared with the April CT. No significant paravertebral edema. Posterior spinal fusion hardware L4 through the sacrum with interbody device at L4-L5 and bilateral SI joint fixating screws. Retained screws at S1 segment. Residual grade 1 anterolisthesis L5 on S1. IMPRESSION: 1. No CT evidence for acute intra-abdominal or pelvic abnormality. No hydronephrosis or ureteral stone. 2. Large stool burden. Mild diverticular disease of the left colon without acute wall thickening. 3. Chronic small left-sided pleural effusion with rounded atelectasis at the left lower lobe. 4. Mild age indeterminate inferior endplate deformity at L2 with minimal sclerosis, new compared with the April CT. Chronic compression fractures at T12 and L3. 5. Aortic atherosclerosis. Aortic Atherosclerosis (ICD10-I70.0). Electronically Signed   By: Luke Bun M.D.   On: 12/16/2023 21:16    Microbiology: Results for orders placed or performed during the hospital encounter of 12/25/23  Urine Culture     Status: Abnormal   Collection Time: 12/25/23  2:37 PM   Specimen: Urine, Clean Catch  Result Value Ref Range Status   Specimen Description   Final    URINE, CLEAN CATCH Performed at [facility], [address] [place], [place] [address]    Special Requests   Final    NONE Performed at [facility], [address] [place], [place] [address]    Culture (A)  Final    >=100,000 COLONIES/mL ESCHERICHIA COLI Confirmed Extended Spectrum Beta-Lactamase Producer (ESBL).  In bloodstream infections from ESBL organisms, carbapenems are preferred over piperacillin /tazobactam. They are shown to have a lower risk of mortality.    Report Status 12/27/2023 FINAL  Final   Organism ID, Bacteria ESCHERICHIA COLI (A)  Final      Susceptibility   Escherichia coli - MIC*    AMPICILLIN >=32 RESISTANT Resistant     CEFAZOLIN  (URINE) Value in next row Resistant      >=32 RESISTANTThis is a modified FDA-approved test that has been validated and its performance characteristics determined by the reporting laboratory.  This laboratory is certified under the Clinical Laboratory Improvement Amendments CLIA  as qualified to perform high complexity clinical laboratory testing.    CEFEPIME Value in next row Resistant      >=32 RESISTANTThis is a modified FDA-approved test that has been validated and its performance characteristics determined by the reporting laboratory.  This laboratory is certified under the Clinical Laboratory Improvement Amendments CLIA as qualified to perform high complexity clinical laboratory testing.    ERTAPENEM  Value in next row Sensitive      >=32 RESISTANTThis is a modified FDA-approved test that has been validated and its performance characteristics determined by the reporting laboratory.  This  laboratory is certified under the Clinical Laboratory Improvement Amendments CLIA as qualified to perform high complexity clinical laboratory testing.    CEFTRIAXONE Value in next row Resistant      >=32 RESISTANTThis is a modified FDA-approved test that has been validated and its performance characteristics determined by the reporting laboratory.  This laboratory is certified under the Clinical Laboratory Improvement Amendments CLIA as qualified to perform high complexity clinical laboratory testing.    CIPROFLOXACIN  Value in next row Resistant      >=32 RESISTANTThis is a modified FDA-approved test that has been validated and its performance characteristics determined by the reporting laboratory.  This laboratory is certified under the Clinical Laboratory Improvement Amendments CLIA as qualified to perform high complexity clinical laboratory testing.    GENTAMICIN  Value in next row Sensitive      >=32 RESISTANTThis is a modified FDA-approved test that has been validated and its performance characteristics determined by the reporting laboratory.  This laboratory is certified under the Clinical Laboratory Improvement Amendments CLIA as qualified to perform high complexity clinical laboratory testing.    NITROFURANTOIN  Value in next row Sensitive      >=32 RESISTANTThis is a modified FDA-approved test that has been validated and its performance characteristics determined by the reporting laboratory.  This laboratory is certified under the Clinical Laboratory Improvement Amendments CLIA as qualified to perform high complexity clinical laboratory testing.    TRIMETH /SULFA  Value in next row Resistant      >=32 RESISTANTThis is a modified FDA-approved test that has been validated and its performance characteristics determined by the reporting laboratory.  This laboratory is certified under the Clinical Laboratory Improvement Amendments CLIA as qualified to perform high complexity clinical laboratory testing.     AMPICILLIN/SULBACTAM Value in next row Resistant      >=32 RESISTANTThis is a modified FDA-approved test that has been validated and its performance characteristics determined by the reporting laboratory.  This laboratory is certified under the Clinical Laboratory Improvement Amendments CLIA as qualified to perform high complexity clinical laboratory testing.    PIP/TAZO Value in next row Sensitive      <=4 SENSITIVEThis is a modified FDA-approved test that has been validated and its performance characteristics determined by the reporting laboratory.  This laboratory is certified under the Clinical Laboratory Improvement Amendments CLIA as qualified to perform high complexity clinical laboratory testing.    MEROPENEM  Value in next row Sensitive      <=4 SENSITIVEThis is a modified FDA-approved test that has been validated and its performance characteristics determined by the reporting laboratory.  This laboratory is certified under the Clinical Laboratory Improvement Amendments CLIA as qualified to perform high complexity clinical laboratory testing.    * >=100,000 COLONIES/mL ESCHERICHIA COLI  Blood culture (routine x 2)     Status: None   Collection Time: 12/25/23  4:51 PM   Specimen: BLOOD RIGHT FOREARM  Result Value Ref Range  Status   Specimen Description   Final    BLOOD RIGHT FOREARM Performed at [facility], [address], [place], [place] [address]    Special Requests   Final    BOTTLES DRAWN AEROBIC AND ANAEROBIC Blood Culture adequate volume Performed at [facility], [address] [place], [place] [address]    Culture   Final    NO GROWTH 5 DAYS Performed at [facility], [address], [place], [place] [address]    Report Status 12/31/2023 FINAL  Final  Blood culture (routine x 2)     Status: None   Collection Time: 12/25/23  4:51 PM   Specimen: BLOOD LEFT FOREARM  Result Value Ref Range Status   Specimen Description   Final     BLOOD LEFT FOREARM Performed at [facility], [address], [place], [place] [address]    Special Requests   Final    BOTTLES DRAWN AEROBIC AND ANAEROBIC Blood Culture adequate volume Performed at [facility], [address] [place], [place] [address]    Culture   Final    NO GROWTH 5 DAYS Performed at [facility], [address], [place], [place] [address]    Report Status 12/31/2023 FINAL  Final    Labs: CBC: No results for input(s): WBC, NEUTROABS, HGB, HCT, MCV, PLT in the last 168 hours.  Basic Metabolic Panel: No results for input(s): NA, K, CL, CO2, GLUCOSE, BUN, CREATININE, CALCIUM, MG, PHOS in the last 168 hours.  Liver Function Tests: No results for input(s): AST, ALT, ALKPHOS, BILITOT, PROT, ALBUMIN  in the last 168 hours.  CBG: No results for input(s): GLUCAP in the last 168 hours.   Discharge time spent: {LESS THAN/GREATER UYJW:[id number]} 30 minutes.  Signed: Burnard DELENA Cunning, DO [facility] 01/15/2024 "

## 2023-12-30 NOTE — Progress Notes (Signed)
 Pt seen for routine Hath morning visit and discharge. Pt appears well and states he still has flank pain. Pt reports pain is 5/10 as noted.   Vital signs and assessment obtained as noted.   Medications administered as noted including toradol  IVP for pain per Dr. Fausto.  IV flushed and removed, found intact with no issues at site.  Pt had virtual visit with Dr. Fausto and virtual RN, Jon. Jon, RN went over AVS with pt.   All equipment removed from Pt's home and Pt was discharged. Encouraged Pt to call HATH hub phone number with any further questions about discharge.

## 2023-12-31 ENCOUNTER — Ambulatory Visit

## 2023-12-31 DIAGNOSIS — I5022 Chronic systolic (congestive) heart failure: Secondary | ICD-10-CM | POA: Diagnosis not present

## 2023-12-31 LAB — GC/CHLAMYDIA PROBE AMP (~~LOC~~) NOT AT ARMC
Chlamydia: NEGATIVE
Comment: NEGATIVE
Comment: NORMAL
Neisseria Gonorrhea: NEGATIVE

## 2023-12-31 LAB — CULTURE, BLOOD (ROUTINE X 2)
Culture: NO GROWTH
Culture: NO GROWTH
Special Requests: ADEQUATE
Special Requests: ADEQUATE

## 2024-01-02 ENCOUNTER — Ambulatory Visit: Payer: Self-pay | Admitting: Cardiology

## 2024-01-02 LAB — CUP PACEART REMOTE DEVICE CHECK
Battery Remaining Longevity: 24 mo
Battery Voltage: 2.97 V
Brady Statistic RV Percent Paced: 93.29 %
Date Time Interrogation Session: 20251226015536
HighPow Impedance: 81 Ohm
Implantable Lead Connection Status: 753985
Implantable Lead Connection Status: 753985
Implantable Lead Connection Status: 753985
Implantable Lead Implant Date: 20190610
Implantable Lead Implant Date: 20200306
Implantable Lead Implant Date: 20200306
Implantable Lead Location: 753858
Implantable Lead Location: 753859
Implantable Lead Location: 753860
Implantable Lead Model: 5071
Implantable Lead Model: 5076
Implantable Pulse Generator Implant Date: 20250625
Lead Channel Impedance Value: 3000 Ohm
Lead Channel Impedance Value: 3000 Ohm
Lead Channel Impedance Value: 304 Ohm
Lead Channel Impedance Value: 361 Ohm
Lead Channel Impedance Value: 380 Ohm
Lead Channel Impedance Value: 437 Ohm
Lead Channel Pacing Threshold Amplitude: 0.625 V
Lead Channel Pacing Threshold Amplitude: 0.625 V
Lead Channel Pacing Threshold Amplitude: 1.75 V
Lead Channel Pacing Threshold Pulse Width: 0.4 ms
Lead Channel Pacing Threshold Pulse Width: 0.4 ms
Lead Channel Pacing Threshold Pulse Width: 1 ms
Lead Channel Sensing Intrinsic Amplitude: 31.625 mV
Lead Channel Sensing Intrinsic Amplitude: 4.3 mV
Lead Channel Setting Pacing Amplitude: 1.5 V
Lead Channel Setting Pacing Amplitude: 2 V
Lead Channel Setting Pacing Amplitude: 4.25 V
Lead Channel Setting Pacing Pulse Width: 0.4 ms
Lead Channel Setting Pacing Pulse Width: 1.5 ms
Lead Channel Setting Sensing Sensitivity: 0.45 mV
Zone Setting Status: 755011
Zone Setting Status: 755011
Zone Setting Status: 755011

## 2024-01-03 NOTE — Progress Notes (Signed)
 Remote ICD Transmission

## 2024-01-04 NOTE — Progress Notes (Signed)
 [facility] Established Patient Visit   Referring Doctor: Self, Referral Primary Doctor: Avelina Cottie Murray, PA-C Date of Service: 01/04/2024  Being seen by Sherlean New, PA-C today in the [facility]. _______________________________________________________________________________________________________  Chief Complaint  Patient presents with   Follow-up   Back Pain    Right    Hip Pain    right   _______________________________________________________________________________________________________  History of Present Illness: [name] is a 68 y.o. male.   Pain Description: Since the last visit the pain is improved in the low back, buttocks, and right knee, but worse in regards to flank pain associated with recent ESBL UTI. The most significant location of pain is the low back/right flank. The 1-2 words that best describe the pain: throbbing, aching, and intermittent  The pain improves with interventional procedures, over the counter medications, prescription medications, heat therapy, and ice The pain is made worse with prolonged standing, ambulation, movement, and general daily activities. The average daily pain score is a 5/10. The pain can be as high as a 7/10 at its worst.  The pain interferes with: All activities.   Therapies: Did the patient have an injection/procedure since the last visit?  - Yes: Right SI joint injection on 11/11/23 by Dr. Catherene              - Percentage of pain relief from injection/procedure: 90%  - Yes: Right Genicular Nerve RFA on 12/10/23 by Dr. Catherene              - Percentage of pain relief from injection/procedure: 90%  Has the patient had any new imaging (X-ray, MRI, CT) for pain concerns since the last visit? Yes   Has physical therapy been initiated or completed for this pain concern? No  Medications: Was a new medication for pain started by our clinic at  the last visit? No              Is the patient on a blood thinner (including aspirin , Goody/BC/Bayer powder)? Yes: Aspirin  81 mg _______________________________________________________________________________________________________  Screenings:    Behavioral Health Screening  Patient Health Questionnaire-2 Score: 0 (01/04/2024  9:04 AM)      Patient's Depression screening is Negative    Depression Plan: Normal/Negative Screening  _______________________________________________________________________________________________________  Historical Information:  Medical History[1]   Surgical History[2]  Family History[3]  Social History   Tobacco Use   Smoking status: Former    Types: Cigarettes   Smokeless tobacco: Never   Tobacco comments:    Stopped 30 years ago (2025)   Substance Use Topics   Alcohol use: Yes    Comment: a beer a month sometimes   Reviewed: Yes _______________________________________________________________________________________________________  Allergies: Ezetimibe, Niacin, Statins-hmg-coa reductase inhibitors, and Sulfamethoxazole -trimethoprim   Current Medications[4] _______________________________________________________________________________________________________  Review of Systems: Reviewed by Sherlean Jenkins New, PA-C ROS was performed with positive/negative pertinent findings listed in HPI.  _______________________________________________________________________________________________________  Physical Exam:  Vitals:   01/04/24 0905  BP: 124/83  BP Location: Left arm  Patient Position: Sitting  Pulse: 88  Resp: 20  Temp: 98.8 F (37.1 C)  TempSrc: Temporal  SpO2: 93%  Weight: 101 kg (223 lb)   Pain Assessment Pain Score  : 4  What number best describes your pain on average in the past week?: 8 What number best describes how, during the past week, pain has interfered with your enjoyment of life?: 10 - Completely  interferes What number best describes how, during the past week,  pain has interfered with your general activity?: 10 - Completely interferes PEG Pain Total Score: 9.33  General: Constitutional: Well developed, Well nourished, No acute distress and Interactive General: Patient is alert and orientated Psychological: The patient's mood is normal, and appropriate for the circumstances  Pain (Neuro/Musculoskeletal): Gait:   Normal. Uses assist device - none  Motor:  Moving extremities against gravity.  _______________________________________________________________________________________________________ Monitoring/Surveillance: [place] PDMP Reviewed: Reviewed   CTE Measures: Is the patient taking prescription opioids? No _______________________________________________________________________________________________________ Imaging and Diagnostic Studies:  All applicable diagnostic studies related to this consultation have been reviewed. Relevant diagnostic reports and/or my personal review of imaging or other diagnostic studies listed below:   EXAM: CT OF THE RIGHT KNEE WITHOUT CONTRAST on 07/25/2021   TECHNIQUE:  Multidetector CT imaging of the right knee was performed according  to the standard protocol. Multiplanar CT image reconstructions were  also generated.   RADIATION DOSE REDUCTION: This exam was performed according to the  departmental dose-optimization program which includes automated  exposure control, adjustment of the mA and/or kV according to  patient size and/or use of iterative reconstruction technique.   COMPARISON:  None Available.   FINDINGS:  Bones/Joint/Cartilage   No evidence of acute fracture or dislocation. Tricompartmental  osteoarthritis which is severe in the medial tibiofemoral  compartment with near complete loss of the articular cartilage,  subchondral cystic changes and sclerosis. Bulky marginal  osteophytes.   Moderate articular cartilage  narrowing on the medial aspect of the  lateral tibiofemoral compartment. Near complete loss of the  articular cartilage at the patellar apex with subchondral cystic  changes. Advanced articular cartilage thinning of the lateral  patellar facet with subchondral sclerosis. Prominent marginal  osteophytes. No significant joint effusion.   Ligaments   Suboptimally assessed by CT.   Muscles and Tendons   Muscles are normal in bulk. Quadriceps tendon and patellar tendon  are intact. No hematoma or fluid collection.   Soft tissues   Skin and subcutaneous soft tissues are unremarkable.   IMPRESSION:  1. No evidence of fracture or dislocation.   2. Advanced medial tibiofemoral osteoarthritis with near complete  loss of the articular cartilage, subchondral cystic changes and  marginal osteophytes.   3. Moderate patellofemoral osteoarthritis with cystic changes at the  patellar apex and subchondral sclerosis of the lateral patellar  facet suggesting focal full-thickness cartilage defect at the  patellar apex and advanced articular cartilage thinning of the  lateral patellar facet. Bulky marginal osteophytes.   4. At least moderate lateral tibiofemoral osteoarthritis with  advanced articular cartilage thinning in the medial aspect of the  tibiofemoral compartment and bulky marginal osteophytes.   5.  Muscles and tendons appear intact.   Electronically Signed    By: Imran  Ahmed D.O.    On: 07/27/2021 13:59    EXAM: CT LUMBAR SPINE WITHOUT CONTRAST on 05/04/2023  TECHNIQUE:  Multidetector CT imaging of the lumbar spine was performed without  intravenous contrast administration. Multiplanar CT image  reconstructions were also generated.   RADIATION DOSE REDUCTION: This exam was performed according to the  departmental dose-optimization program which includes automated  exposure control, adjustment of the mA and/or kV according to  patient size and/or use of iterative  reconstruction technique.   COMPARISON:  Radiography 04/26/2023 and 10/28/2020.  CT 10/03/2020   FINDINGS:  Segmentation: 5 lumbar type vertebral bodies as numbered previously.   Alignment: Anterolisthesis at L5-S1 of 6 mm as seen previously.   Vertebrae: Old compression fracture at T12  with loss of height  anteriorly of 50%. No retropulsed bone. This was acute in 09/30/2020. No evidence of pronounced progression or any suspicion of  additional recent fracture at this level. Old superior endplate  depression at L3 with loss of height of 10%. No evidence of  progressive or recent collapse.   Paraspinal and other soft tissues: Some left pleural fluid is  present. There is aortic atherosclerosis.   Disc levels: T11-12: No significant canal or foraminal narrowing.   T12-L1: No significant canal or foraminal narrowing.   L1-2: Mild bulging of the disc. Mild facet hypertrophy. No  compressive stenosis.   L2-3: Endplate osteophytes and bulging of the disc. Facet and  ligamentous hypertrophy. Mild stenosis of both lateral recesses.  Some worsening of this disc degeneration since 2022.   L3-4: Endplate osteophytes and bulging of the disc. Facet and  ligamentous hypertrophy. Moderate multifactorial stenosis, worsened  since 2022.   L4-5: Posterior decompression, diskectomy and fusion procedure.  Pedicle screws and posterior rods. Solid union at this level with  wide patency of the canal and foramina.   L5-S1: Anterolisthesis of 6 mm. Nonfunctional screw fragments in S1.  L5 screws do not show loosening. Bilateral sacroiliac screws without  evidence of loosening. Nonunion across the L5-S1 level. Wide  decompression of the central canal. Bulging of the disc. Bilateral  foraminal encroachment due to the anterolisthesis and bulging disc  material could possibly affect either or both exiting L5 nerves.   There are bridging osteophytes of the sacroiliac joints. Sacroiliac   screws successfully cross both joints.   IMPRESSION:  1. Old compression fracture at T12 with loss of height anteriorly of  50%. No retropulsed bone. This was acute in 09-30-2020. No  evidence of pronounced progression or any suspicion of additional  recent fracture at this level.  2. Old superior endplate depression at L3 with loss of height of  10%. No evidence of progressive or recent collapse.  3. L2-3: Endplate osteophytes and bulging of the disc. Facet and  ligamentous hypertrophy. Mild stenosis of both lateral recesses.  Some worsening of this disc degeneration since 2022.  4. L3-4: Endplate osteophytes and bulging of the disc. Facet and  ligamentous hypertrophy. Moderate multifactorial stenosis, worsened  since 2022.  5. L4-5: Posterior decompression, diskectomy and fusion procedure.  Solid union at this level with wide patency of the canal and  foramina.  6. L5-S1: Anterolisthesis of 6 mm. Nonfunctional screw fragments in  S1. L5 screws do not show loosening. Bilateral sacroiliac screws  without evidence of loosening. Nonunion across the L5-S1 level. Wide  decompression of the central canal. Bilateral foraminal encroachment  due to the anterolisthesis and bulging disc material could possibly  affect either or both exiting L5 nerves.  7. Aortic atherosclerosis.   Aortic Atherosclerosis (ICD10-I70.0).   Electronically Signed    By: Oneil Officer M.D.    On: 05/04/2023 15:16    EXAM: XR SPINE LUMBAR COMPLETE 4+ VIEWS, 07/22/2023 3:27 PM   INDICATION: Ap/Lat/Oblique, Spondylosis without myelopathy or radiculopathy, lumbar region \ M47.816 Spondylosis without myelopathy or radiculopathy, lumbar region  Ap/Lat/Oblique   COMPARISON: None   IMPRESSION: Chronic compression fracture of T12, unchanged compared to chest x-ray 03/08/2023. Mild compression fractures of L2 and L3 with 5% height loss, age indeterminate.   Rudimentary ribs at T12. Revision posterior fusion  spanning. L4-S1. Residual screw fragments in S1. Intact interbody grafts at L4-L5. No hardware complication.    Mild to  moderate degenerative disc disease at the unfused levels. Arterial calcifications. Osteopenia.   The most inferior median sternotomy wire is broken.   I have personally reviewed the procedure note and/or have reviewed and interpreted this image/images. Electronically Signed By: Roena Hy, MD on 08/01/2023  9:22 AM   _______________________________________________________________________________________________________  Relevant Labs:  Lab Results  Component Value Date   CREATININE 1.08 12/14/2023   Lab Results  Component Value Date   WBC 7.80 12/14/2023   HGB 15.7 12/14/2023   HCT 45.1 12/14/2023   MCV 96.9 (H) 12/14/2023   PLT 146 (L) 12/14/2023   Lab Results  Component Value Date   HGBA1C 8.6 (H) 12/14/2023    Reviewed: Yes _______________________________________________________________________________________________________ Diagnosis/Impression:   Chronic knee pain after total replacement of right knee joint  Sacroiliac pain  Lumbar facet arthropathy  A/P 01/04/2024: [name] is a 68 y.o. male who presents to the clinic for continued management of chronic pain.  He has a PMH significant for anxiety, HTN, HLS, diabetes mellitus type II, CAD s/p CABG, CHF, s/p ICD, rheumatoid arthritis, chronic low back pain, lumbar facet arthropathy, lumbar DDD, prior vertebral compression fracture (T12, L2, L3), prior lumbar decompression and fusion, chronic buttock pain, and chronic right knee pain after right TKA.  Today, he presents to the clinic after completing right SI joint injection on 11/11/23 and right genicular nerve RFA on 12/10/23 by Dr. Catherene.  He reports 90% alleviation of his right sided low back and buttock and right knee pain post procedurally.  At this time, he notes overall improvement of his chronic pain.  However, he does report some  increased pain along his right flank.  Patient reports this has been ongoing since his recent diagnosis of ESBL UTI.  Per record review and patient report, this pain is being managed by Internal Medicine.  In regards to his chronic pain, he feels that his symptoms are overall well-controlled at this time.  He does not feel that further interventional procedures are warranted.  Although, he does express interest in pursuing repeat/additional interventional procedures in the future if her symptoms increase in severity or frequency.  Therefore, at this time no further interventional procedures are recommended given overall improvement of his chronic pain symptoms after most recent interventional procedures.  He will plan to follow-up in 4 weeks for further evaluation, and depending on his symptoms at that time may consider pursuing additional interventional procedures as appropriate.  In the meantime, he will plan to continue with use of his current medication regimen as prescribed, including Lyrica  50 mg 3 times daily and Cymbalta  40 mg daily, given some noted improvement of his symptoms with use of these medications without any adverse effects.  He will also plan to continue with use of his other current medications as prescribed outside providers.  He was also encouraged to continue conservative measures, including HEP.  Otherwise, he will plan to follow with his PCP and other specialist as scheduled/needed in the interim.  Recommended Plan of Care: - Interventional Procedures: - Prior: Right Genicular Nerve RFA on 06/02/2023 by Dr. Catherene  - Prior: Left L2-L3 and L3-L4 RFA on 09/03/2023 by Dr. Catherene  - Prior: Right L2-L3 and L3-L4 RFA on 09/15/2023 by Dr. Catherene  - Prior: Right SI joint injection on 11/11/2023 by Dr. Catherene - 90% alleviation  - Prior: Right Genicular Nerve RFA on 12/10/2023 by Dr. Catherene - 90% alleviation  - None at this time due to improved pain - Consider: SCS  in the future  -  Medical Modalities:  - Continue: Lyrica  50 mg three times daily - Last filled 12/09/23 - Continue: Cymbalta  20 mg capsules - Take 40 mg (2 capsules) daily  - Continue other current medications as prescribed by outside providers  Discussed medication risks, benefits, alternatives, and side effects of medication at length.  All of patient's questions and concerns addressed. - AVOID NSAIDS given cardiac history  - Imaging/Labs:  - None - Consults/Therapy:  - Continue: HEP (Physical Therapy previously completed)  - Follow-up with PCP/Internal Medicine, Orthopedic Surgery, Rheumatology, and Cardiology as scheduled/needed - Follow up: - Return in about 4 weeks (around 02/01/2024).  Treatment plan fully discussed and agreed upon with patient. All questions were answered.  Medical decision making for this patient was moderately complex given the independent interpretation of imaging and lab results, review of relevant records from other healthcare providers, the severity/progression of their condition, and the risk of complication and/or morbidity that may exist with or without treatment.  Time (>30 minutes) was spent on reviewing patient PMH, medications, procedures and image, performing medically appropriate examination, and counseling and educating patient.  Documentation on day of service.  Electronically signed by: Sherlean Jenkins New, PA-C 01/04/2024 10:12 AM       [1] Past Medical History: Diagnosis Date   Allergic 09/26/2023   Chest cold heavy mucus,coughing   Headache    Behind my eyes   High cholesterol    Hypertension    Pneumonia [id number]   Possibly   UTI (urinary tract infection)   [2] Past Surgical History: Procedure Laterality Date   CERVICAL SPINE SURGERY     CORONARY ANGIOPLASTY     with stents   CORONARY ARTERY BYPASS GRAFT     JOINT REPLACEMENT  12/17/2021   Right knee   LUMBAR DISC SURGERY     MAXILLOFACIAL SURGERY     RADIOFREQUENCY ABLATION  Right 06/02/2023   ABLATION RADIOFREQUENCY GENICULAR - Right #1/1 performed by Toribio Fairy Badder, MD at [facility]   RADIOFREQUENCY ABLATION Right 12/10/2023   ABLATION RADIOFREQUENCY NERVE LOWER EXTREMITY - Right Genicular RFA performed by Toribio Fairy Badder, MD at [facility]   REPLACEMENT TOTAL KNEE Right    RHIZOTOMY W/ RADIOFREQUENCY ABLATION Left 09/03/2023   RHIZOTOMY FACET RADIOFREQUENCY - Left L2-L4 #1/2 performed by Toribio Fairy Badder, MD at [facility]   RHIZOTOMY W/ RADIOFREQUENCY ABLATION Right 09/15/2023   RHIZOTOMY FACET RADIOFREQUENCY  RIGHT L2-L4 #2/2 performed by Toribio Fairy Badder, MD at [facility]   SPINE SURGERY  Not sure   Multiple disc replacements   VENTRICULOPERITONEAL SHUNT  Unsure   I think 7 shunts  [3] Family History Problem Relation Name Age of Onset   Hypertension Mother     Coronary artery disease Mother     Breast cancer Mother         age 93 at diagnosis   Hypertension Father     Coronary artery disease Father     Diabetes Sister     Hypertension Brother     Coronary artery disease Brother    [4] Current Outpatient Medications  Medication Sig Dispense Refill   aspirin  81 mg EC tablet Take 81 mg by mouth.     carvediloL  (COREG ) 6.25 mg tablet Take by mouth every 12 (twelve) hours.     collagen-biotin-ascorbic acid (Collagen 1500 Plus C) 500 mg-800 mcg- 50 mg cap Take by mouth.     cyclobenzaprine  (  FLEXERIL ) 10 mg tablet Take 10 mg by mouth 2 (two) times a day as needed for muscle spasms.     DULoxetine  (CYMBALTA ) 20 mg capsule TAKE 2 CAPSULES BY MOUTH ONCE DAILY (Patient taking differently: Take 40 mg by mouth daily.) 180 capsule 0   folic acid  (FOLVITE ) 1 mg tablet Take 1 tablet (1 mg total) by mouth daily. (Patient taking differently: Take 1 mg by mouth daily.) 90 tablet 3   furosemide  (LASIX ) 40 mg tablet Take 40 mg by mouth daily.     glucose blood test strip Use twice daily 200 each 3    glucose monitoring kit kit Use as directed fasting and 2 hours post prandial 1 each 2   ketorolac  (TORADOL ) 10 mg tablet Take 10 mg by mouth every 6 (six) hours as needed. for up to 4 days     methotrexate 2.5 mg tablet Take 6 tablets (15 mg total) by mouth once a week. Take exactly as directed by prescriber.     mv-min-folic-lycop-lut-herb178 (Mega Multivitamin For Men) 200-175-250 mcg tab      nitroglycerin  (NITROSTAT ) 0.4 mg SL tablet      oxyCODONE  (ROXICODONE ) 5 mg immediate release tablet every 6 (six) hours as needed. as needed for up to 10 doses     pantoprazole  (PROTONIX ) 40 mg EC tablet TAKE 1 TABLET BY MOUTH EVERY DAY (Patient taking differently: Take 40 mg by mouth daily.) 90 tablet 1   pregabalin  (LYRICA ) 50 mg capsule TAKE 1 CAPSULE BY MOUTH EVERY MORNING and TAKE 2 CAPSULES EVERY EVENING. For post procedure burning pain. 90 capsule 0   Repatha  SureClick 140 mg/mL pnij      valsartan  (DIOVAN ) 40 mg tablet Take 40 mg by mouth daily.     zolpidem  (AMBIEN ) 10 mg tablet Take 1 tablet (10 mg total) by mouth nightly.     No current facility-administered medications for this visit.

## 2024-01-24 ENCOUNTER — Other Ambulatory Visit: Payer: Self-pay

## 2024-01-24 ENCOUNTER — Observation Stay (HOSPITAL_BASED_OUTPATIENT_CLINIC_OR_DEPARTMENT_OTHER)
Admission: EM | Admit: 2024-01-24 | Discharge: 2024-01-25 | Disposition: A | Discharge status: Home / Self Care | Attending: Internal Medicine | Admitting: Internal Medicine

## 2024-01-24 ENCOUNTER — Emergency Department (HOSPITAL_BASED_OUTPATIENT_CLINIC_OR_DEPARTMENT_OTHER): Admitting: Radiology

## 2024-01-24 ENCOUNTER — Emergency Department (HOSPITAL_BASED_OUTPATIENT_CLINIC_OR_DEPARTMENT_OTHER)

## 2024-01-24 ENCOUNTER — Encounter (HOSPITAL_BASED_OUTPATIENT_CLINIC_OR_DEPARTMENT_OTHER): Payer: Self-pay | Admitting: *Deleted

## 2024-01-24 DIAGNOSIS — I110 Hypertensive heart disease with heart failure: Secondary | ICD-10-CM | POA: Insufficient documentation

## 2024-01-24 DIAGNOSIS — I4891 Unspecified atrial fibrillation: Secondary | ICD-10-CM | POA: Diagnosis not present

## 2024-01-24 DIAGNOSIS — I517 Cardiomegaly: Secondary | ICD-10-CM | POA: Insufficient documentation

## 2024-01-24 DIAGNOSIS — R0902 Hypoxemia: Secondary | ICD-10-CM | POA: Diagnosis not present

## 2024-01-24 DIAGNOSIS — J9811 Atelectasis: Secondary | ICD-10-CM | POA: Insufficient documentation

## 2024-01-24 DIAGNOSIS — N12 Tubulo-interstitial nephritis, not specified as acute or chronic: Secondary | ICD-10-CM | POA: Diagnosis not present

## 2024-01-24 DIAGNOSIS — Z959 Presence of cardiac and vascular implant and graft, unspecified: Secondary | ICD-10-CM | POA: Insufficient documentation

## 2024-01-24 DIAGNOSIS — I2510 Atherosclerotic heart disease of native coronary artery without angina pectoris: Secondary | ICD-10-CM | POA: Diagnosis not present

## 2024-01-24 DIAGNOSIS — J90 Pleural effusion, not elsewhere classified: Secondary | ICD-10-CM | POA: Diagnosis not present

## 2024-01-24 DIAGNOSIS — I700 Atherosclerosis of aorta: Secondary | ICD-10-CM | POA: Insufficient documentation

## 2024-01-24 DIAGNOSIS — M069 Rheumatoid arthritis, unspecified: Secondary | ICD-10-CM | POA: Diagnosis not present

## 2024-01-24 DIAGNOSIS — E119 Type 2 diabetes mellitus without complications: Secondary | ICD-10-CM | POA: Diagnosis not present

## 2024-01-24 DIAGNOSIS — Z7982 Long term (current) use of aspirin: Secondary | ICD-10-CM | POA: Diagnosis not present

## 2024-01-24 DIAGNOSIS — N119 Chronic tubulo-interstitial nephritis, unspecified: Principal | ICD-10-CM | POA: Insufficient documentation

## 2024-01-24 DIAGNOSIS — Z743 Need for continuous supervision: Secondary | ICD-10-CM | POA: Diagnosis not present

## 2024-01-24 DIAGNOSIS — J439 Emphysema, unspecified: Secondary | ICD-10-CM | POA: Diagnosis not present

## 2024-01-24 DIAGNOSIS — Z79899 Other long term (current) drug therapy: Secondary | ICD-10-CM | POA: Insufficient documentation

## 2024-01-24 DIAGNOSIS — I10 Essential (primary) hypertension: Secondary | ICD-10-CM | POA: Diagnosis present

## 2024-01-24 DIAGNOSIS — Z22358 Carrier of other enterobacterales: Secondary | ICD-10-CM

## 2024-01-24 DIAGNOSIS — R10A1 Flank pain, right side: Secondary | ICD-10-CM | POA: Diagnosis present

## 2024-01-24 DIAGNOSIS — K219 Gastro-esophageal reflux disease without esophagitis: Secondary | ICD-10-CM | POA: Diagnosis not present

## 2024-01-24 DIAGNOSIS — Z87448 Personal history of other diseases of urinary system: Secondary | ICD-10-CM | POA: Diagnosis present

## 2024-01-24 DIAGNOSIS — I5020 Unspecified systolic (congestive) heart failure: Secondary | ICD-10-CM | POA: Insufficient documentation

## 2024-01-24 DIAGNOSIS — K5730 Diverticulosis of large intestine without perforation or abscess without bleeding: Secondary | ICD-10-CM | POA: Diagnosis not present

## 2024-01-24 DIAGNOSIS — S32009K Unspecified fracture of unspecified lumbar vertebra, subsequent encounter for fracture with nonunion: Secondary | ICD-10-CM | POA: Diagnosis present

## 2024-01-24 DIAGNOSIS — G8929 Other chronic pain: Secondary | ICD-10-CM | POA: Diagnosis present

## 2024-01-24 LAB — TROPONIN T, HIGH SENSITIVITY
Troponin T High Sensitivity: 64 ng/L — ABNORMAL HIGH (ref 0–19)
Troponin T High Sensitivity: 56 ng/L — ABNORMAL HIGH (ref 0–19)

## 2024-01-24 LAB — URINALYSIS, ROUTINE W REFLEX MICROSCOPIC
Bilirubin Urine: NEGATIVE
Glucose, UA: NEGATIVE mg/dL
Hgb urine dipstick: NEGATIVE
Ketones, ur: NEGATIVE mg/dL
Nitrite: NEGATIVE
Protein, ur: 100 mg/dL — AB
Specific Gravity, Urine: 1.022 (ref 1.005–1.030)
pH: 6 (ref 5.0–8.0)

## 2024-01-24 LAB — HEPATIC FUNCTION PANEL
ALT: 26 U/L (ref 0–44)
AST: 31 U/L (ref 15–41)
Albumin: 4.3 g/dL (ref 3.5–5.0)
Alkaline Phosphatase: 86 U/L (ref 38–126)
Bilirubin, Direct: 0.3 mg/dL — ABNORMAL HIGH (ref 0.0–0.2)
Indirect Bilirubin: 0.8 mg/dL (ref 0.3–0.9)
Total Bilirubin: 1.1 mg/dL (ref 0.0–1.2)
Total Protein: 7 g/dL (ref 6.5–8.1)

## 2024-01-24 LAB — CBC
HCT: 47 % (ref 39.0–52.0)
Hemoglobin: 15.7 g/dL (ref 13.0–17.0)
MCH: 32.8 pg (ref 26.0–34.0)
MCHC: 33.4 g/dL (ref 30.0–36.0)
MCV: 98.3 fL (ref 80.0–100.0)
Platelets: 203 K/uL (ref 150–400)
RBC: 4.78 MIL/uL (ref 4.22–5.81)
RDW: 15.1 % (ref 11.5–15.5)
WBC: 6 K/uL (ref 4.0–10.5)
nRBC: 0 % (ref 0.0–0.2)

## 2024-01-24 LAB — BASIC METABOLIC PANEL WITH GFR
Anion gap: 11 (ref 5–15)
BUN: 13 mg/dL (ref 8–23)
CO2: 28 mmol/L (ref 22–32)
Calcium: 10.2 mg/dL (ref 8.9–10.3)
Chloride: 100 mmol/L (ref 98–111)
Creatinine, Ser: 0.97 mg/dL (ref 0.61–1.24)
GFR, Estimated: >60 mL/min
Glucose, Bld: 224 mg/dL — ABNORMAL HIGH (ref 70–99)
Potassium: 4.4 mmol/L (ref 3.5–5.1)
Sodium: 139 mmol/L (ref 135–145)

## 2024-01-24 LAB — LIPASE, BLOOD: Lipase: 38 U/L (ref 11–51)

## 2024-01-24 MED ORDER — MORPHINE SULFATE (PF) 4 MG/ML IV SOLN
4.0000 mg | Freq: Once | INTRAVENOUS | Status: AC
  Administered 2024-01-24: 4 mg via INTRAVENOUS
  Filled 2024-01-24: qty 1

## 2024-01-24 MED ORDER — SODIUM CHLORIDE 0.9 % IV BOLUS
1000.0000 mL | Freq: Once | INTRAVENOUS | Status: AC
  Administered 2024-01-24: 1000 mL via INTRAVENOUS

## 2024-01-24 MED ORDER — ACETAMINOPHEN 500 MG PO TABS
1000.0000 mg | ORAL_TABLET | Freq: Three times a day (TID) | ORAL | Status: DC | PRN
  Administered 2024-01-24: 1000 mg via ORAL
  Filled 2024-01-24: qty 2

## 2024-01-24 MED ORDER — ONDANSETRON HCL 4 MG/2ML IJ SOLN
4.0000 mg | Freq: Once | INTRAMUSCULAR | Status: AC
  Administered 2024-01-24: 4 mg via INTRAVENOUS
  Filled 2024-01-24: qty 2

## 2024-01-24 MED ORDER — HEPARIN SODIUM (PORCINE) 5000 UNIT/ML IJ SOLN
5000.0000 [IU] | Freq: Three times a day (TID) | INTRAMUSCULAR | Status: DC
  Administered 2024-01-25 (×2): 5000 [IU] via SUBCUTANEOUS
  Filled 2024-01-24 (×2): qty 1

## 2024-01-24 MED ORDER — ALBUTEROL SULFATE (2.5 MG/3ML) 0.083% IN NEBU: 2.5000 mg | INHALATION_SOLUTION | RESPIRATORY_TRACT | Status: DC | PRN

## 2024-01-24 MED ORDER — INSULIN ASPART 100 UNIT/ML IJ SOLN
0.0000 [IU] | Freq: Three times a day (TID) | INTRAMUSCULAR | Status: DC
  Filled 2024-01-24: qty 1

## 2024-01-24 MED ORDER — PANTOPRAZOLE SODIUM 40 MG PO TBEC
40.0000 mg | DELAYED_RELEASE_TABLET | Freq: Every day | ORAL | Status: DC
  Administered 2024-01-25: 40 mg via ORAL
  Filled 2024-01-24: qty 1

## 2024-01-24 MED ORDER — ONDANSETRON HCL 4 MG PO TABS: 4.0000 mg | ORAL_TABLET | Freq: Four times a day (QID) | ORAL | Status: DC | PRN

## 2024-01-24 MED ORDER — ONDANSETRON HCL 4 MG/2ML IJ SOLN: 4.0000 mg | Freq: Four times a day (QID) | INTRAMUSCULAR | Status: DC | PRN

## 2024-01-24 MED ORDER — ASPIRIN 81 MG PO TBEC
81.0000 mg | DELAYED_RELEASE_TABLET | Freq: Every day | ORAL | Status: DC
  Administered 2024-01-25: 81 mg via ORAL
  Filled 2024-01-24: qty 1

## 2024-01-24 MED ORDER — ZOLPIDEM TARTRATE 5 MG PO TABS
10.0000 mg | ORAL_TABLET | Freq: Every day | ORAL | Status: DC
  Administered 2024-01-24: 10 mg via ORAL
  Filled 2024-01-24: qty 2

## 2024-01-24 MED ORDER — OXYCODONE HCL 5 MG PO TABS
5.0000 mg | ORAL_TABLET | Freq: Three times a day (TID) | ORAL | Status: DC | PRN
  Administered 2024-01-24: 5 mg via ORAL
  Filled 2024-01-24: qty 1

## 2024-01-24 MED ORDER — SODIUM CHLORIDE 0.9 % IV SOLN
1.0000 g | Freq: Three times a day (TID) | INTRAVENOUS | Status: DC
  Administered 2024-01-24 – 2024-01-25 (×4): 1 g via INTRAVENOUS
  Filled 2024-01-24 (×3): qty 20

## 2024-01-24 MED ORDER — IOHEXOL 300 MG/ML  SOLN
100.0000 mL | Freq: Once | INTRAMUSCULAR | Status: AC | PRN
  Administered 2024-01-24: 100 mL via INTRAVENOUS

## 2024-01-24 MED ORDER — ACETAMINOPHEN 325 MG PO TABS: 650.0000 mg | ORAL_TABLET | Freq: Four times a day (QID) | ORAL | Status: DC | PRN

## 2024-01-24 MED ORDER — ACETAMINOPHEN 650 MG RE SUPP: 650.0000 mg | Freq: Four times a day (QID) | RECTAL | Status: DC | PRN

## 2024-01-24 NOTE — Plan of Care (Signed)
 Direct admit call: [name] Nian, PA-C  69 y/o M with h/o ESBL UTI, ureteral stricture presents with R sided flank pain and suprapubic pain. Non septic. CT shows bladder wall thickening and perinephric stranding. ED PA concerned for pyelonephritis. He was last treated and admitted in the end of Dec and discharged on 12/31/23. He could not get back in with his urologist to discuss these persistent complaints. He had a 15 min episode of chest pain last night. Troponin trend is flat. He has h/o CAD/ CABG. She has ordered Meropenem  due to h/o ESBL. No nausea, vomiting or fevers. I have asked for a Urine culture, diet order and PRN pain medications. Will place in observation to telemetry unit.  Disney Ruggiero, MD [facility]

## 2024-01-24 NOTE — ED Notes (Signed)
 Pt's [employer] pacemaker interrogated at this time.

## 2024-01-24 NOTE — ED Notes (Signed)
 Pt c/o HA after morphine  administration, advised it's never done this to me before. Pt denies blurred vision, NV, additional symptoms; EDP notified of same, no new orders at this time

## 2024-01-24 NOTE — H&P (Incomplete)
 " History and Physical    [name] DOB: 1955-06-19 DOA: 01/24/2024  PCP: Emerick Avelina POUR, PA-C  Patient coming from: [facility]  I have personally briefly reviewed patient's old medical records in [facility]  Chief Complaint:  recurrent right flank with hx of recurrent UTI  HPI: [name] is a 69 y.o. male with medical history significant of CAD status post CABG, chronic HFrEF with ICD/Pacemaker, rheumatoid arthritis, and UTI due to ESBL producing E. coli  who has recent interim history of admission 12/20-12/25  with diagnosis of Ecoli (Extended spectrum beta lactamase)  ESBL for which he was treated with meropenem . Patient now returns with persistent right flank pain with dysuria as well as associated palpitations and sweats. Patient also on further ros was found to have a episode of left sided chest pain that resolved on its own and has not recurred. He denies any sob, presyncope, n/v/d/ or abdominal pain. He notes pain currently ad 2/10 at rest but with movement pain increases to 8/10.   ED Course:   EKG: paced Labs  Wbc 6, hgb 15.7, plt 203 UA: mod LE  rare bacteria wbc 11-20  Na 130, K 4.4 , cl 100, glu 224 CE 64,56 Tx morphine  , zofran , NS 1L  Cxr: IMPRESSION: 1. Chronic left pleural effusion with left basilar atelectasis. 2. Low lung volumes with bilateral interstitial prominence, which could reflect bronchovascular crowding secondary to hypoinflation or pulmonary interstitial edema.  CTAB IMPRESSION: 1. Circumferential bladder wall thickening is suspicious for cystitis. Recommend correlation with urinalysis. 2. Chronic nonspecific perinephric fat stranding. 3. Left colonic diverticulosis. 4. Chronic small left-sided pleural effusion with rounded atelectasis at the left lower lobe. 5. Aortic atherosclerosis. 6. Additional unchanged non-acute findings as described above. Review of Systems: As per HPI otherwise 10 point review of systems negative.    Past Medical History:  Diagnosis Date   AICD (automatic cardioverter/defibrillator) present    Anxiety    Arthritis    mild; back, neck, spine (06/09/2017)   CHF (congestive heart failure) (HCC)    Chronic back pain    Coronary artery disease    GERD (gastroesophageal reflux disease)    History of gout    History of kidney stones X 2   Hypercholesterolemia    Hypertension    LBBB (left bundle branch block)    MI (myocardial infarction) (HCC) 1993   LATERAL   MI (myocardial infarction) (HCC) ?09/2001; 06/07/2017   Pneumonia    several times (06/09/2017)   Pre-diabetes    S/P coronary artery stent placement    RCA   Varicose veins     Past Surgical History:  Procedure Laterality Date   ANTERIOR CERVICAL DECOMP/DISCECTOMY FUSION  03/03/2011   Procedure: ANTERIOR CERVICAL DECOMPRESSION/DISCECTOMY FUSION 3 LEVELS;  Surgeon: Catalina CHRISTELLA Stains, MD;  Location: [facility];  Service: Neurosurgery;  Laterality: N/A;  Cervical three-four Cervical four-five Cervical five-six Anterior cervical decompression/diskectomy, fusion   APPLICATION OF ROBOTIC ASSISTANCE FOR SPINAL PROCEDURE N/A 10/03/2020   Procedure: APPLICATION OF ROBOTIC ASSISTANCE FOR SPINAL PROCEDURE;  Surgeon: Lanis Pupa, MD;  Location: [facility] OR;  Service: Neurosurgery;  Laterality: N/A;   BACK SURGERY     BIV ICD GENERATOR CHANGEOUT N/A 06/30/2023   Procedure: BIV ICD GENERATOR CHANGEOUT;  Surgeon: Kennyth Chew, MD;  Location: [facility] INVASIVE CV LAB;  Service: Cardiovascular;  Laterality: N/A;   CARDIAC CATHETERIZATION  2009   Stents in RCA patent. 70 to 80% PL, and 50% ostial PD.  CARDIAC CATHETERIZATION N/A 11/20/2014   Procedure: Left Heart Cath and Coronary Angiography;  Surgeon: Peter M Jordan, MD;  Location: [facility] INVASIVE CV LAB;  Service: Cardiovascular;  Laterality: N/A;   CORONARY ANGIOPLASTY     DIRECT ANGIOPLASTY THE MARGINAL BRANCH   CORONARY ANGIOPLASTY WITH STENT PLACEMENT     RCA   CORONARY ARTERY BYPASS  GRAFT N/A 06/14/2017   Procedure: CORONARY ARTERY BYPASS GRAFTING (CABG) x four, using left internal mammary artery and right leg greater saphenous vein harvested endoscopically;  Surgeon: Fleeta Hanford Coy, MD;  Location: [facility] OR;  Service: Open Heart Surgery;  Laterality: N/A;   CORONARY PRESSURE/FFR STUDY N/A 06/24/2021   Procedure: INTRAVASCULAR PRESSURE WIRE/FFR STUDY;  Surgeon: Anner Alm ORN, MD;  Location: [facility] INVASIVE CV LAB;  Service: Cardiovascular;  Laterality: N/A;   CORONARY STENT INTERVENTION N/A 06/24/2021   Procedure: CORONARY STENT INTERVENTION;  Surgeon: Anner Alm ORN, MD;  Location: [facility] INVASIVE CV LAB;  Service: Cardiovascular;  Laterality: N/A;   CYSTOSCOPY N/A 06/14/2017   Procedure: CYSTOSCOPY;  Surgeon: Fleeta Hanford Coy, MD;  Location: [facility] OR;  Service: Open Heart Surgery;  Laterality: N/A;   ICD IMPLANT N/A 03/11/2018   Procedure: ICD IMPLANT - Dual Chamber;  Surgeon: Fernande Elspeth BROCKS, MD;  Location: [facility] INVASIVE CV LAB;  Service: Cardiovascular;  Laterality: N/A;   INSERTION OF SUPRAPUBIC CATHETER N/A 06/14/2017   Procedure: INSERTION OF SUPRAPUBIC CATHETER - Lower abdomen;  Surgeon: Fleeta Hanford Coy, MD;  Location: [facility] OR;  Service: Open Heart Surgery;  Laterality: N/A;   POSTERIOR LUMBAR FUSION  10/2011   RIGHT/LEFT HEART CATH AND CORONARY ANGIOGRAPHY N/A 06/08/2017   Procedure: RIGHT/LEFT HEART CATH AND CORONARY ANGIOGRAPHY;  Surgeon: Jordan, Peter M, MD;  Location: [facility] INVASIVE CV LAB;  Service: Cardiovascular;  Laterality: N/A;   RIGHT/LEFT HEART CATH AND CORONARY/GRAFT ANGIOGRAPHY N/A 09/06/2020   Procedure: RIGHT/LEFT HEART CATH AND CORONARY/GRAFT ANGIOGRAPHY;  Surgeon: Jordan, Peter M, MD;  Location: [facility] INVASIVE CV LAB;  Service: Cardiovascular;  Laterality: N/A;   RIGHT/LEFT HEART CATH AND CORONARY/GRAFT ANGIOGRAPHY N/A 06/24/2021   Procedure: RIGHT/LEFT HEART CATH AND CORONARY/GRAFT ANGIOGRAPHY;  Surgeon: Anner Alm ORN, MD;  Location: [facility] INVASIVE CV LAB;  Service:  Cardiovascular;  Laterality: N/A;   TEE WITHOUT CARDIOVERSION N/A 06/14/2017   Procedure: TRANSESOPHAGEAL ECHOCARDIOGRAM (TEE);  Surgeon: Fleeta Hanford, Coy, MD;  Location: [facility] OR;  Service: Open Heart Surgery;  Laterality: N/A;   TOTAL KNEE ARTHROPLASTY Right 12/17/2021   Procedure: TOTAL KNEE ARTHROPLASTY;  Surgeon: Gerome Charleston, MD;  Location: [facility] ORS;  Service: Orthopedics;  Laterality: Right;  adductor canal  120   URETHROPLASTY N/A 10/15/2017   Procedure: URETHROPLASTY WITH BUCCAL GRAFT HARVAST;  Surgeon: Cam Morene ORN, MD;  Location: [facility] ORS;  Service: Urology;  Laterality: N/A;     reports that he quit smoking about 24 years ago. His smoking use included cigarettes. He started smoking about 44 years ago. He has a 40 pack-year smoking history. He has never used smokeless tobacco. He reports that he does not currently use alcohol. He reports that he does not use drugs.  Allergies[1]  Family History  Problem Relation Age of Onset   Breast cancer Mother    Coronary artery disease Mother    Stroke Father    Heart attack Father    Diabetes Father    Heart attack Brother    Heart disease Brother    Heart attack Maternal Grandfather     Prior to Admission medications  Medication Sig  Start Date End Date Taking? Authorizing Provider  amoxicillin -clavulanate (AUGMENTIN ) 875-125 MG tablet Take 1 tablet by mouth 2 (two) times daily. 01/18/24  Yes [provider]  acetaminophen  (TYLENOL ) 500 MG tablet Take 1,000 mg by mouth 3 (three) times daily. Pt. Reports he is taking half Patient taking differently: Take 1,000 mg by mouth in the morning and at bedtime. Pt. Reports he is taking half    [provider]  aspirin  81 MG EC tablet Take 81 mg by mouth in the morning.    [provider]  carvedilol  (COREG ) 6.25 MG tablet TAKE 1 TABLET BY MOUTH 2 TIMES DAILY WITH A meal ** must have office visit FOR addtional refills** Patient taking differently: Take 3.125 tablets  by mouth 2 (two) times daily. 09/21/23   Jordan, Peter M, MD  cetirizine (ZYRTEC) 10 MG tablet Take 10 mg by mouth daily as needed for allergies.    [provider]  cyclobenzaprine  (FLEXERIL ) 10 MG tablet Take 1 tablet (10 mg total) by mouth 2 (two) times daily as needed for muscle spasms. 12/16/23   Curatolo, Adam, DO  DULoxetine  (CYMBALTA ) 20 MG capsule Take 40 mg by mouth daily.    [provider]  folic acid  (FOLVITE ) 1 MG tablet Take 1 mg by mouth daily. 11/10/23 11/09/24  [provider]  furosemide  (LASIX ) 40 MG tablet TAKE 1/2 TABLET BY MOUTH ONCE DAILY 11/26/23   Jordan, Peter M, MD  Glucosamine-Chondroitin (OSTEO BI-FLEX REGULAR STRENGTH PO) Take 1 tablet by mouth 2 (two) times daily.    [provider]  Misc Natural Products (DEEP SLEEP) CAPS Take 2 capsules by mouth at bedtime.    [provider]  Multiple Vitamins-Minerals (AIRBORNE GUMMIES PO) Take 3 tablets by mouth 2 (two) times daily.    [provider]  Multiple Vitamins-Minerals (MULTIVITAMIN WITH MINERALS) tablet Take 1 tablet by mouth daily. Mega men multivitamin pak    [provider]  nitroGLYCERIN  (NITROSTAT ) 0.4 MG SL tablet Place 1 tablet (0.4 mg total) under the tongue every 5 (five) minutes x 3 doses as needed for chest pain. 09/03/23   Jordan, Peter M, MD  pantoprazole  (PROTONIX ) 40 MG tablet TAKE 1 TABLET BY MOUTH EVERY DAY 02/26/23   Sherlynn Madden, MD  pregabalin  (LYRICA ) 50 MG capsule Take 2 capsules (100 mg total) by mouth every evening. 12/29/23 12/31/23  Laurence Locus, DO  REPATHA  SURECLICK 140 MG/ML SOAJ inject into THE SKIN EVERY 14 DAYS Patient taking differently: Inject 140 mg into the skin every 14 (fourteen) days. 12/20/23   Jordan, Peter M, MD  valsartan  (DIOVAN ) 40 MG tablet TAKE 1 TABLET BY MOUTH ONCE DAILY 10/21/23   Emelia Josefa HERO, NP  zolpidem  (AMBIEN ) 10 MG tablet Take 1 tablet (10 mg total) by mouth at bedtime. 12/30/23   Fausto Burnard LABOR, DO    Physical Exam: Vitals:   01/24/24 1530 01/24/24 1635 01/24/24 1925 01/24/24 2102  BP: 136/75 (!) 141/81  (!) 161/86  Pulse: 77 80  78  Resp: 18 14    Temp:  98.9 F (37.2 C)  98.1 F (36.7 C)  TempSrc:  Oral  Oral  SpO2: 92% 92%  98%  Weight:   98.9 kg   Height:   6' 1 (1.854 m)     Constitutional: NAD, calm, comfortable Vitals:   01/24/24 1530 01/24/24 1635 01/24/24 1925 01/24/24 2102  BP: 136/75 (!) 141/81  (!) 161/86  Pulse: 77 80  78  Resp: 18 14  Temp:  98.9 F (37.2 C)  98.1 F (36.7 C)  TempSrc:  Oral  Oral  SpO2: 92% 92%  98%  Weight:   98.9 kg   Height:   6' 1 (1.854 m)    Physical Exam Constitutional:      General: He is not in acute distress.    Appearance: He is not ill-appearing.  HENT:     Head: Normocephalic and atraumatic.     Mouth/Throat:     Mouth: Mucous membranes are moist.  Eyes:     Extraocular Movements: Extraocular movements intact.     Conjunctiva/sclera: Conjunctivae normal.     Pupils: Pupils are equal, round, and reactive to light.  Cardiovascular:     Rate and Rhythm: Normal rate and regular rhythm.  Pulmonary:     Effort: Pulmonary effort is normal.     Breath sounds: Normal breath sounds.  Abdominal:     General: There is no distension.     Palpations: Abdomen is soft.     Tenderness: There is no abdominal tenderness. There is left CVA tenderness. There is no right CVA tenderness.  Musculoskeletal:     Right lower leg: No edema.     Left lower leg: No edema.  Skin:    General: Skin is warm and dry.  Neurological:     General: No focal deficit present.     Mental Status: He is alert and oriented to person, place, and time.  Psychiatric:        Mood and Affect: Mood normal.     Labs on Admission: I have personally reviewed following labs and imaging studies  CBC: Recent Labs  Lab 01/24/24 1212  WBC 6.0  HGB 15.7  HCT 47.0  MCV 98.3  PLT 203   Basic Metabolic Panel: Recent Labs  Lab 01/24/24 1212   NA 139  K 4.4  CL 100  CO2 28  GLUCOSE 224*  BUN 13  CREATININE 0.97  CALCIUM 10.2   GFR: Estimated Creatinine Clearance: 89 mL/min (by C-G formula based on SCr of 0.97 mg/dL). Liver Function Tests: Recent Labs  Lab 01/24/24 1212  AST 31  ALT 26  ALKPHOS 86  BILITOT 1.1  PROT 7.0  ALBUMIN  4.3   Recent Labs  Lab 01/24/24 1212  LIPASE 38   No results for input(s): AMMONIA in the last 168 hours. Coagulation Profile: No results for input(s): INR, PROTIME in the last 168 hours. Cardiac Enzymes: No results for input(s): CKTOTAL, CKMB, CKMBINDEX, TROPONINI in the last 168 hours. BNP (last 3 results) No results for input(s): PROBNP in the last 8760 hours. HbA1C: No results for input(s): HGBA1C in the last 72 hours. CBG: No results for input(s): GLUCAP in the last 168 hours. Lipid Profile: No results for input(s): CHOL, HDL, LDLCALC, TRIG, CHOLHDL, LDLDIRECT in the last 72 hours. Thyroid  Function Tests: No results for input(s): TSH, T4TOTAL, FREET4, T3FREE, THYROIDAB in the last 72 hours. Anemia Panel: No results for input(s): VITAMINB12, FOLATE, FERRITIN, TIBC, IRON, RETICCTPCT in the last 72 hours. Urine analysis:    Component Value Date/Time   COLORURINE YELLOW 01/24/2024 1212   APPEARANCEUR CLEAR 01/24/2024 1212   APPEARANCEUR Cloudy (A) 05/21/2023 0902   LABSPEC 1.022 01/24/2024 1212   LABSPEC 1.025 01/31/2020 0920   PHURINE 6.0 01/24/2024 1212   GLUCOSEU NEGATIVE 01/24/2024 1212   HGBUR NEGATIVE 01/24/2024 1212   BILIRUBINUR NEGATIVE 01/24/2024 1212   BILIRUBINUR Negative 05/21/2023 0902   KETONESUR NEGATIVE 01/24/2024 1212   PROTEINUR  100 (A) 01/24/2024 1212   UROBILINOGEN negative (A) 04/29/2022 0935   UROBILINOGEN 0.2 11/19/2014 1643   NITRITE NEGATIVE 01/24/2024 1212   LEUKOCYTESUR MODERATE (A) 01/24/2024 1212    Radiological Exams on Admission: CT ABDOMEN PELVIS W CONTRAST Result Date:  01/24/2024 CLINICAL DATA:  Abdominal pain. History of chronic UTI and recent hospitalization. EXAM: CT ABDOMEN AND PELVIS WITH CONTRAST TECHNIQUE: Multidetector CT imaging of the abdomen and pelvis was performed using the standard protocol following bolus administration of intravenous contrast. RADIATION DOSE REDUCTION: This exam was performed according to the departmental dose-optimization program which includes automated exposure control, adjustment of the mA and/or kV according to patient size and/or use of iterative reconstruction technique. CONTRAST:  OMNIPAQUE  IOHEXOL  300 MG/ML  SOLN COMPARISON:  CT abdomen/pelvis dated 12/25/2023. FINDINGS: Lower chest: Redemonstrated chronic small left pleural effusion with mild thickening and adjacent chronic rounded atelectasis. Subpleural blebs in the right lower lobe. Cardiomegaly and coronary vascular calcification. Partially visualized cardiac pacing leads. Hepatobiliary: No focal liver abnormality is seen. No gallstones, gallbladder wall thickening, or biliary dilatation. Pancreas: Unremarkable. No pancreatic ductal dilatation or surrounding inflammatory changes. Spleen: Normal in size without focal abnormality. Adrenals/Urinary Tract: Adrenal glands are unremarkable. Kidneys enhance symmetrically. No suspicious focal lesion. Similar chronic nonspecific perinephric stranding. No hydronephrosis. No hydronephrosis. No renal or ureteral calculi. Bladder is partially distended with circumferential bladder wall thickening. Stomach/Bowel: Stomach is within normal limits. No evidence of obstruction. No focal inflammatory changes. Left colonic diverticulosis. Vascular/Lymphatic: Nonaneurysmal abdominal aorta with extensive atherosclerotic calcification. No enlarged abdominal or pelvic lymph nodes. Reproductive: Prostate is unremarkable. Other: No abdominopelvic ascites. No intraperitoneal free air. Small unchanged fat containing left inguinal hernia. Musculoskeletal:  Diffuse osseous demineralization. Chronic T12 compression fracture. Unchanged superior endplate compression deformity of T11, which is new compared to the prior exam dated 05/04/2023, however, not significantly changed compared to the more recent prior exam dated 12/25/2023. Chronic superior endplate compression deformity of L3. Unchanged inferior endplate compression deformity of L2. Posterior spinal fusion hardware extending from L4 through the sacrum with retained screws again noted at S1. Unchanged grade 1 anterolisthesis of L5 on S1 and retrolisthesis of L3 on L4. IMPRESSION: 1. Circumferential bladder wall thickening is suspicious for cystitis. Recommend correlation with urinalysis. 2. Chronic nonspecific perinephric fat stranding. 3. Left colonic diverticulosis. 4. Chronic small left-sided pleural effusion with rounded atelectasis at the left lower lobe. 5. Aortic atherosclerosis. 6. Additional unchanged non-acute findings as described above. Electronically Signed   By: Harrietta Sherry M.D.   On: 01/24/2024 15:03   DG Chest Port 1 View Result Date: 01/24/2024 CLINICAL DATA:  Palpitations. EXAM: PORTABLE CHEST 1 VIEW COMPARISON:  04/12/2023. FINDINGS: Stable cardiomegaly. Prior median sternotomy and CABG. Left chest wall pacemaker/AICD with leads terminating in the right atrium and right ventricle. Epicardial pacing lead is also noted. Low lung volumes with bilateral interstitial prominence, which could reflect bronchovascular crowding secondary to hypoinflation or pulmonary interstitial edema. Chronic left pleural effusion with left basilar atelectasis. No pneumothorax. Partially visualized cervical fusion hardware. Degenerative changes of the bilateral shoulders. No acute osseous abnormality. IMPRESSION: 1. Chronic left pleural effusion with left basilar atelectasis. 2. Low lung volumes with bilateral interstitial prominence, which could reflect bronchovascular crowding secondary to hypoinflation or  pulmonary interstitial edema. Electronically Signed   By: Harrietta Sherry M.D.   On: 01/24/2024 14:45    EKG: Independently reviewed.   Assessment/Plan Acute on Chronic Pyelonephritis  -hx of ESBL ,Ecoli -start ertapenem   -ID /urologyconsult in am  -  admit to tele  -supportive care  -f/u on culture detail  Hfref  -possible mild exacerbation  -patient xray with concern for pulmonary edema  -f/u with repeat CT chest  -check bnp   DMII -iss/fs   CAD s/p CABG -mild bump in cardiac enzyme -EKG unchanged  -cycle ce  -resume cardiac medications once med rec completed  RA -hold immune modulating drugs due to infection   GERD -ppi    DVT prophylaxis:  heparin  Code Status: full/ as discussed per patient wishes in event of cardiac arrest  Family Communication:  none at bedside Disposition Plan: patient  expected to be admitted greater than 2 midnights  Consults called: ID in am  Admission status: med tele   Camila DELENA Ned MD [facility]   If 7PM-7AM, please contact night-coverage [email or web address] Password [email or web address]  01/24/2024, 11:14 PM        [1]  Allergies Allergen Reactions   Niacin Other (See Comments)    Drops BP and severe joint/muscle pain   Statins Other (See Comments)    Severe joint pain and drops BP.  Other Reaction(s): Unknown   Zetia [Ezetimibe] Other (See Comments)    Joint pain and drops BP   Bactrim  [Sulfamethoxazole -Trimethoprim ] Other (See Comments)    Blurry vision, slurred speech, brain fog   "

## 2024-01-24 NOTE — ED Triage Notes (Signed)
 Pt has been having a chronic UTI with e-coli since 10/28.  Pt was hospitalized until 12/28 and has been having continued symptoms such as right flank pain as well as occasional pain with urination (he is still on augmentin ) .  No fever or chills.  Pt has also been having palpitations this am it was beating real hard.  Pt reports that he was clammy and he has had 3 episodes of this.  Pt has ICD and pacemaker.

## 2024-01-24 NOTE — ED Provider Notes (Signed)
 " [facility] AT [facility] Provider Note   CSN: [id number] Arrival date & time: 01/24/24  1154     Patient presents with: Flank Pain and Palpitations   [name] is a 69 y.o. male. With past medical history of congestive heart failure, coronary artery disease, history of ESBL, history of urethral stricture presents to emergency room with complaint of urinary tract infection with right sided flank pain.  Patient reports that he initially had improvement in symptoms when he was discharged from the hospital on December 28, symptoms started up shortly after again and he say urology and was sequently started on Augmentin , he presents today with worsening right flank pain and dysuria. Reports fatigue, denies fever, muscle aches, chills.  Also mentions that last night he had about 15 minutes of chest pain, left sided, did not radiate, gone now, some chronic SOB, no cough, no fever, no edema. Has pacemaker and ICD.    Flank Pain Associated symptoms include chest pain.  Palpitations Associated symptoms: chest pain        Prior to Admission medications  Medication Sig Start Date End Date Taking? Authorizing Provider  amoxicillin -clavulanate (AUGMENTIN ) 875-125 MG tablet Take 1 tablet by mouth 2 (two) times daily. 01/18/24  Yes [provider]  acetaminophen  (TYLENOL ) 500 MG tablet Take 1,000 mg by mouth 3 (three) times daily. Pt. Reports he is taking half Patient taking differently: Take 1,000 mg by mouth in the morning and at bedtime. Pt. Reports he is taking half    [provider]  aspirin  81 MG EC tablet Take 81 mg by mouth in the morning.    [provider]  carvedilol  (COREG ) 6.25 MG tablet TAKE 1 TABLET BY MOUTH 2 TIMES DAILY WITH A meal ** must have office visit FOR addtional refills** Patient taking differently: Take 3.125 tablets by mouth 2 (two) times daily. 09/21/23   Jordan, Peter M, MD  cetirizine (ZYRTEC) 10 MG tablet  Take 10 mg by mouth daily as needed for allergies.    [provider]  cyclobenzaprine  (FLEXERIL ) 10 MG tablet Take 1 tablet (10 mg total) by mouth 2 (two) times daily as needed for muscle spasms. 12/16/23   Curatolo, Adam, DO  DULoxetine  (CYMBALTA ) 20 MG capsule Take 40 mg by mouth daily.    [provider]  folic acid  (FOLVITE ) 1 MG tablet Take 1 mg by mouth daily. 11/10/23 11/09/24  [provider]  furosemide  (LASIX ) 40 MG tablet TAKE 1/2 TABLET BY MOUTH ONCE DAILY 11/26/23   Jordan, Peter M, MD  Glucosamine-Chondroitin (OSTEO BI-FLEX REGULAR STRENGTH PO) Take 1 tablet by mouth 2 (two) times daily.    [provider]  Misc Natural Products (DEEP SLEEP) CAPS Take 2 capsules by mouth at bedtime.    [provider]  Multiple Vitamins-Minerals (AIRBORNE GUMMIES PO) Take 3 tablets by mouth 2 (two) times daily.    [provider]  Multiple Vitamins-Minerals (MULTIVITAMIN WITH MINERALS) tablet Take 1 tablet by mouth daily. Mega men multivitamin pak    [provider]  nitroGLYCERIN  (NITROSTAT ) 0.4 MG SL tablet Place 1 tablet (0.4 mg total) under the tongue every 5 (five) minutes x 3 doses as needed for chest pain. 09/03/23   Jordan, Peter M, MD  pantoprazole  (PROTONIX ) 40 MG tablet TAKE 1 TABLET BY MOUTH EVERY DAY 02/26/23   Sherlynn Madden, MD  pregabalin  (LYRICA ) 50 MG capsule Take 2 capsules (100 mg total) by mouth every evening. 12/29/23 12/31/23  Laurence,  Eric, DO  REPATHA  SURECLICK 140 MG/ML SOAJ inject into THE SKIN EVERY 14 DAYS Patient taking differently: Inject 140 mg into the skin every 14 (fourteen) days. 12/20/23   Jordan, Peter M, MD  valsartan  (DIOVAN ) 40 MG tablet TAKE 1 TABLET BY MOUTH ONCE DAILY 10/21/23   Emelia Josefa HERO, NP  zolpidem  (AMBIEN ) 10 MG tablet Take 1 tablet (10 mg total) by mouth at bedtime. 12/30/23   Fausto Burnard LABOR, DO    Allergies: Niacin, Statins, Zetia [ezetimibe], and Bactrim   [sulfamethoxazole -trimethoprim ]    Review of Systems  Cardiovascular:  Positive for chest pain and palpitations.  Genitourinary:  Positive for flank pain.    Updated Vital Signs BP 114/70   Pulse 70   Temp 97.6 F (36.4 C) (Oral)   Resp 18   Wt 98.9 kg   SpO2 90%   BMI 28.76 kg/m   Physical Exam Vitals and nursing note reviewed.  Constitutional:      General: He is not in acute distress.    Appearance: He is not toxic-appearing.  HENT:     Head: Normocephalic and atraumatic.  Eyes:     General: No scleral icterus.    Conjunctiva/sclera: Conjunctivae normal.  Cardiovascular:     Rate and Rhythm: Normal rate and regular rhythm.     Pulses: Normal pulses.     Heart sounds: Normal heart sounds.  Pulmonary:     Effort: Pulmonary effort is normal. No respiratory distress.     Breath sounds: Normal breath sounds.  Abdominal:     General: Abdomen is flat. Bowel sounds are normal.     Palpations: Abdomen is soft.     Tenderness: There is abdominal tenderness in the suprapubic area. There is right CVA tenderness. There is no left CVA tenderness.  Musculoskeletal:     Right lower leg: No edema.     Left lower leg: No edema.  Skin:    General: Skin is warm and dry.     Findings: No lesion.  Neurological:     General: No focal deficit present.     Mental Status: He is alert and oriented to person, place, and time. Mental status is at baseline.     (all labs ordered are listed, but only abnormal results are displayed) Labs Reviewed  URINALYSIS, ROUTINE W REFLEX MICROSCOPIC - Abnormal; Notable for the following components:      Result Value   Protein, ur 100 (*)    Leukocytes,Ua MODERATE (*)    Bacteria, UA RARE (*)    All other components within normal limits  BASIC METABOLIC PANEL WITH GFR - Abnormal; Notable for the following components:   Glucose, Bld 224 (*)    All other components within normal limits  HEPATIC FUNCTION PANEL - Abnormal; Notable for the following  components:   Bilirubin, Direct 0.3 (*)    All other components within normal limits  TROPONIN T, HIGH SENSITIVITY - Abnormal; Notable for the following components:   Troponin T High Sensitivity 64 (*)    All other components within normal limits  TROPONIN T, HIGH SENSITIVITY - Abnormal; Notable for the following components:   Troponin T High Sensitivity 56 (*)    All other components within normal limits  CBC  LIPASE, BLOOD    EKG: None  Radiology: CT ABDOMEN PELVIS W CONTRAST Result Date: 01/24/2024 CLINICAL DATA:  Abdominal pain. History of chronic UTI and recent hospitalization. EXAM: CT ABDOMEN AND PELVIS WITH CONTRAST TECHNIQUE: Multidetector CT imaging of the abdomen  and pelvis was performed using the standard protocol following bolus administration of intravenous contrast. RADIATION DOSE REDUCTION: This exam was performed according to the departmental dose-optimization program which includes automated exposure control, adjustment of the mA and/or kV according to patient size and/or use of iterative reconstruction technique. CONTRAST:  OMNIPAQUE  IOHEXOL  300 MG/ML  SOLN COMPARISON:  CT abdomen/pelvis dated 12/25/2023. FINDINGS: Lower chest: Redemonstrated chronic small left pleural effusion with mild thickening and adjacent chronic rounded atelectasis. Subpleural blebs in the right lower lobe. Cardiomegaly and coronary vascular calcification. Partially visualized cardiac pacing leads. Hepatobiliary: No focal liver abnormality is seen. No gallstones, gallbladder wall thickening, or biliary dilatation. Pancreas: Unremarkable. No pancreatic ductal dilatation or surrounding inflammatory changes. Spleen: Normal in size without focal abnormality. Adrenals/Urinary Tract: Adrenal glands are unremarkable. Kidneys enhance symmetrically. No suspicious focal lesion. Similar chronic nonspecific perinephric stranding. No hydronephrosis. No hydronephrosis. No renal or ureteral calculi. Bladder is  partially distended with circumferential bladder wall thickening. Stomach/Bowel: Stomach is within normal limits. No evidence of obstruction. No focal inflammatory changes. Left colonic diverticulosis. Vascular/Lymphatic: Nonaneurysmal abdominal aorta with extensive atherosclerotic calcification. No enlarged abdominal or pelvic lymph nodes. Reproductive: Prostate is unremarkable. Other: No abdominopelvic ascites. No intraperitoneal free air. Small unchanged fat containing left inguinal hernia. Musculoskeletal: Diffuse osseous demineralization. Chronic T12 compression fracture. Unchanged superior endplate compression deformity of T11, which is new compared to the prior exam dated 05/04/2023, however, not significantly changed compared to the more recent prior exam dated 12/25/2023. Chronic superior endplate compression deformity of L3. Unchanged inferior endplate compression deformity of L2. Posterior spinal fusion hardware extending from L4 through the sacrum with retained screws again noted at S1. Unchanged grade 1 anterolisthesis of L5 on S1 and retrolisthesis of L3 on L4. IMPRESSION: 1. Circumferential bladder wall thickening is suspicious for cystitis. Recommend correlation with urinalysis. 2. Chronic nonspecific perinephric fat stranding. 3. Left colonic diverticulosis. 4. Chronic small left-sided pleural effusion with rounded atelectasis at the left lower lobe. 5. Aortic atherosclerosis. 6. Additional unchanged non-acute findings as described above. Electronically Signed   By: Harrietta Sherry M.D.   On: 01/24/2024 15:03   DG Chest Port 1 View Result Date: 01/24/2024 CLINICAL DATA:  Palpitations. EXAM: PORTABLE CHEST 1 VIEW COMPARISON:  04/12/2023. FINDINGS: Stable cardiomegaly. Prior median sternotomy and CABG. Left chest wall pacemaker/AICD with leads terminating in the right atrium and right ventricle. Epicardial pacing lead is also noted. Low lung volumes with bilateral interstitial prominence, which  could reflect bronchovascular crowding secondary to hypoinflation or pulmonary interstitial edema. Chronic left pleural effusion with left basilar atelectasis. No pneumothorax. Partially visualized cervical fusion hardware. Degenerative changes of the bilateral shoulders. No acute osseous abnormality. IMPRESSION: 1. Chronic left pleural effusion with left basilar atelectasis. 2. Low lung volumes with bilateral interstitial prominence, which could reflect bronchovascular crowding secondary to hypoinflation or pulmonary interstitial edema. Electronically Signed   By: Harrietta Sherry M.D.   On: 01/24/2024 14:45     Procedures   Medications Ordered in the ED  morphine  (PF) 4 MG/ML injection 4 mg (4 mg Intravenous Given 01/24/24 1253)  ondansetron  (ZOFRAN ) injection 4 mg (4 mg Intravenous Given 01/24/24 1254)  sodium chloride  0.9 % bolus 1,000 mL (0 mLs Intravenous Stopped 01/24/24 1414)  iohexol  (OMNIPAQUE ) 300 MG/ML solution 100 mL (100 mLs Intravenous Contrast Given 01/24/24 1430)  Medical Decision Making Amount and/or Complexity of Data Reviewed Labs: ordered. Radiology: ordered.  Risk Prescription drug management. Decision regarding hospitalization.   This patient presents to the ED for concern of abdominal pain, this involves an extensive number of treatment options, and is a complaint that carries with it a high risk of complications and morbidity.  The differential diagnosis includes cholecystitis, AAA, appendicitis, renal stone, UTI   Co morbidities that complicate the patient evaluation  Last echo was about a year ago showing an EF of 30 to 35%   Additional history obtained:  Additional history obtained from Was recently admitted and discharged into the hospital for acute pyelonephritis and started on meropenem  due to ESBL   Lab Tests:  I personally interpreted labs.  The pertinent results include:   CBC shows no leukocytosis, no anemia.   Normal kidney and liver function Troponin is 60, delta is 58 UA is significant for moderate leukocytes, 11-20 WBC and rare bacteria. Sent for culture.    Imaging Studies ordered:  I ordered imaging studies including CT abd/pelvis, chest x-ray   I independently visualized and interpreted imaging which showed x-ray showing chronic left pleural effusion and low lung volumes.  CT scan of abdomen pelvis shows bladder wall thickening and chronic right perinephric fat stranding. I agree with the radiologist interpretation   Cardiac Monitoring: / EKG:  The patient was maintained on a cardiac monitor.  I personally viewed and interpreted the cardiac monitored which showed an underlying rhythm of: sinus, paced   Consultations Obtained:  I requested consultation with the hospital team for admission,  and discussed lab and imaging findings as well as pertinent plan.   Problem List / ED Course / Critical interventions / Medication management  Patient presents to emergency room with complaint of right sided flank pain which has worsened since the beginning of January.  On arrival hemodynamically stable and well-appearing.  He does not have white count or signs of systemic illness at this time.  He does have right sided CVA tenderness and reproducible tenderness in suprapubic area on exam.  His CT scan does show right-sided perinephric stranding and circumferential bladder wall thickening suspicious for cystitis.  His UA is positive for moderate leukocytes with rare bacteria.  Given that he has ESBL E. coli infection he will require admission for IV meropenem  for this.  He also complains of having 15 minutes of chest pain that started last night and subsequently resolved.  He does have a pacemaker and I will evaluate his pacemaker here.  EKG is not showing STEMI, troponin's are mildly elevated at 50s, but flat. No PNA on x-ray, no sign of fluid overload on exam and no reported SOB with this.  I ordered  medication including Meropenem .  Patient was given 4 mg of IV morphine  and subsequently had drop in blood pressure which improved after 1 L of normal saline. Reevaluation of the patient after these medicines showed that the patient improved I have reviewed the patients home medicines and have made adjustments as needed. Patient will be admitted for pyelonephritis and ESBL UTI      Final diagnoses:  Pyelonephritis  ESBL E. coli carrier    ED Discharge Orders     None          Shermon Warren SAILOR, PA-C 01/24/24 1649  "

## 2024-01-24 NOTE — ED Notes (Signed)
 EDP at bedside for re-eval per pt wife request

## 2024-01-24 NOTE — ED Notes (Signed)
 [facility] RN accepted pt without additional report

## 2024-01-24 NOTE — ED Notes (Signed)
 Called Kim at [facility] for transport 18:51-TC

## 2024-01-25 ENCOUNTER — Observation Stay (HOSPITAL_COMMUNITY)

## 2024-01-25 ENCOUNTER — Ambulatory Visit: Admitting: Cardiology

## 2024-01-25 ENCOUNTER — Encounter (HOSPITAL_COMMUNITY): Payer: Self-pay

## 2024-01-25 DIAGNOSIS — N12 Tubulo-interstitial nephritis, not specified as acute or chronic: Secondary | ICD-10-CM | POA: Diagnosis not present

## 2024-01-25 DIAGNOSIS — G8929 Other chronic pain: Secondary | ICD-10-CM

## 2024-01-25 DIAGNOSIS — I2510 Atherosclerotic heart disease of native coronary artery without angina pectoris: Secondary | ICD-10-CM | POA: Diagnosis not present

## 2024-01-25 DIAGNOSIS — Z87448 Personal history of other diseases of urinary system: Secondary | ICD-10-CM

## 2024-01-25 DIAGNOSIS — I2583 Coronary atherosclerosis due to lipid rich plaque: Secondary | ICD-10-CM | POA: Diagnosis not present

## 2024-01-25 DIAGNOSIS — M545 Low back pain, unspecified: Secondary | ICD-10-CM

## 2024-01-25 DIAGNOSIS — S32009K Unspecified fracture of unspecified lumbar vertebra, subsequent encounter for fracture with nonunion: Secondary | ICD-10-CM

## 2024-01-25 DIAGNOSIS — I10 Essential (primary) hypertension: Secondary | ICD-10-CM

## 2024-01-25 LAB — CBC
HCT: 41.9 % (ref 39.0–52.0)
Hemoglobin: 13.6 g/dL (ref 13.0–17.0)
MCH: 33 pg (ref 26.0–34.0)
MCHC: 32.5 g/dL (ref 30.0–36.0)
MCV: 101.7 fL — ABNORMAL HIGH (ref 80.0–100.0)
Platelets: 168 K/uL (ref 150–400)
RBC: 4.12 MIL/uL — ABNORMAL LOW (ref 4.22–5.81)
RDW: 15.5 % (ref 11.5–15.5)
WBC: 6.1 K/uL (ref 4.0–10.5)
nRBC: 0 % (ref 0.0–0.2)

## 2024-01-25 LAB — COMPREHENSIVE METABOLIC PANEL WITH GFR
ALT: 23 U/L (ref 0–44)
AST: 25 U/L (ref 15–41)
Albumin: 3.7 g/dL (ref 3.5–5.0)
Alkaline Phosphatase: 74 U/L (ref 38–126)
Anion gap: 8 (ref 5–15)
BUN: 16 mg/dL (ref 8–23)
CO2: 29 mmol/L (ref 22–32)
Calcium: 9.3 mg/dL (ref 8.9–10.3)
Chloride: 102 mmol/L (ref 98–111)
Creatinine, Ser: 1.15 mg/dL (ref 0.61–1.24)
GFR, Estimated: >60 mL/min
Glucose, Bld: 172 mg/dL — ABNORMAL HIGH (ref 70–99)
Potassium: 5.3 mmol/L — ABNORMAL HIGH (ref 3.5–5.1)
Sodium: 139 mmol/L (ref 135–145)
Total Bilirubin: 0.7 mg/dL (ref 0.0–1.2)
Total Protein: 5.9 g/dL — ABNORMAL LOW (ref 6.5–8.1)

## 2024-01-25 LAB — HEMOGLOBIN A1C
Hgb A1c MFr Bld: 8.8 % — ABNORMAL HIGH (ref 4.8–5.6)
Mean Plasma Glucose: 205.86 mg/dL

## 2024-01-25 LAB — GLUCOSE, CAPILLARY
Glucose-Capillary: 137 mg/dL — ABNORMAL HIGH (ref 70–99)
Glucose-Capillary: 112 mg/dL — ABNORMAL HIGH (ref 70–99)

## 2024-01-25 LAB — URINE CULTURE: Culture: NO GROWTH

## 2024-01-25 LAB — PRO BRAIN NATRIURETIC PEPTIDE: Pro Brain Natriuretic Peptide: 537 pg/mL — ABNORMAL HIGH

## 2024-01-25 MED ORDER — FUROSEMIDE 20 MG PO TABS
20.0000 mg | ORAL_TABLET | Freq: Every day | ORAL | Status: DC
  Filled 2024-01-25: qty 1

## 2024-01-25 MED ORDER — CARVEDILOL 3.125 MG PO TABS
3.1250 mg | ORAL_TABLET | Freq: Two times a day (BID) | ORAL | Status: DC
  Filled 2024-01-25: qty 1

## 2024-01-25 MED ORDER — ACETAMINOPHEN 650 MG RE SUPP: 650.0000 mg | Freq: Four times a day (QID) | RECTAL | Status: DC | PRN

## 2024-01-25 MED ORDER — ORAL CARE MOUTH RINSE: 15.0000 mL | OROMUCOSAL | Status: DC | PRN

## 2024-01-25 MED ORDER — ADULT MULTIVITAMIN W/MINERALS CH
1.0000 | ORAL_TABLET | Freq: Every day | ORAL | Status: DC
  Filled 2024-01-25: qty 1

## 2024-01-25 MED ORDER — IRBESARTAN 75 MG PO TABS: 37.5000 mg | ORAL_TABLET | Freq: Every day | ORAL | Status: DC

## 2024-01-25 MED ORDER — PREGABALIN 100 MG PO CAPS
100.0000 mg | ORAL_CAPSULE | Freq: Every evening | ORAL | Status: DC
  Filled 2024-01-25: qty 1

## 2024-01-25 MED ORDER — FOLIC ACID 1 MG PO TABS
1.0000 mg | ORAL_TABLET | Freq: Every day | ORAL | Status: DC
  Filled 2024-01-25: qty 1

## 2024-01-25 MED ORDER — INFLUENZA VAC SPLIT HIGH-DOSE 0.5 ML IM SUSY: 0.5000 mL | PREFILLED_SYRINGE | INTRAMUSCULAR | Status: DC

## 2024-01-25 MED ORDER — ACETAMINOPHEN 325 MG PO TABS
650.0000 mg | ORAL_TABLET | ORAL | Status: DC | PRN
  Administered 2024-01-25 (×2): 650 mg via ORAL
  Filled 2024-01-25 (×2): qty 2

## 2024-01-25 NOTE — Plan of Care (Signed)

## 2024-01-25 NOTE — Care Management Obs Status (Signed)
 MEDICARE OBSERVATION STATUS NOTIFICATION   Patient Details  Name: [name] MRN: [id number] Date of Birth: March 29, 1955   Medicare Observation Status Notification Given:  Yes    Merilee LOISE Batty, RN 01/25/2024, 6:35 PM

## 2024-01-25 NOTE — Discharge Summary (Incomplete)
 " Physician Discharge Summary   Patient: [name] MRN: [id number] DOB: [birth year]  Admit date:     01/24/2024  Discharge date: {dischdate:26783}  Discharge Physician: Concepcion Riser   PCP: Emerick Avelina POUR, PA-C   Recommendations at discharge:  {Tip this will not be part of the note when signed- Example include specific recommendations for outpatient follow-up, pending tests to follow-up on. (Optional):26781}  ***  Discharge Diagnoses: Principal Problem:   Pyelonephritis  Resolved Problems:   * No resolved hospital problems. [facility] Course: No notes on file  Assessment and Plan: No notes have been filed under this hospital service. Service: Hospitalist     {Tip this will not be part of the note when signed Body mass index is 28.77 kg/m. , ,  (Optional):26781}  {(NOTE) Pain control PDMP Statment (Optional):26782} Consultants: *** Procedures performed: ***  Disposition: {Plan; Disposition:26390} Diet recommendation:  Discharge Diet Orders (From admission, onward)     Start     Ordered   01/25/24 0000  Diet - low sodium heart healthy        01/25/24 1800           {Diet_Plan:26776} DISCHARGE MEDICATION: Allergies as of 01/25/2024       Reactions   Niacin Other (See Comments)   Drops BP and severe joint/muscle pain   Statins Other (See Comments)   Severe joint pain and drops BP. Other Reaction(s): Unknown   Zetia [ezetimibe] Other (See Comments)   Joint pain and drops BP   Bactrim  [sulfamethoxazole -trimethoprim ] Other (See Comments)   Blurry vision, slurred speech, brain fog        Medication List     PAUSE taking these medications    valsartan  40 MG tablet Wait to take this until: January 31, 2024 Commonly known as: DIOVAN  TAKE 1 TABLET BY MOUTH ONCE DAILY       STOP taking these medications    amoxicillin -clavulanate 875-125 MG tablet Commonly known as: AUGMENTIN    cetirizine 10 MG tablet Commonly known as: ZYRTEC    cyclobenzaprine  10 MG tablet Commonly known as: FLEXERIL        TAKE these medications    acetaminophen  500 MG tablet Commonly known as: TYLENOL  Take 1,000 mg by mouth 3 (three) times daily. Pt. Reports he is taking half What changed: when to take this   AIRBORNE GUMMIES PO Take 3 tablets by mouth 2 (two) times daily.   multivitamin with minerals tablet Take 1 tablet by mouth daily. Mega men multivitamin pak   aspirin  EC 81 MG tablet Take 81 mg by mouth in the morning.   carvedilol  6.25 MG tablet Commonly known as: COREG  TAKE 1 TABLET BY MOUTH 2 TIMES DAILY WITH A meal ** must have office visit FOR addtional refills** What changed: See the new instructions.   Deep Sleep Caps Take 2 capsules by mouth at bedtime.   DULoxetine  20 MG capsule Commonly known as: CYMBALTA  Take 40 mg by mouth daily.   folic acid  1 MG tablet Commonly known as: FOLVITE  Take 1 mg by mouth daily.   furosemide  40 MG tablet Commonly known as: LASIX  TAKE 1/2 TABLET BY MOUTH ONCE DAILY   nitroGLYCERIN  0.4 MG SL tablet Commonly known as: NITROSTAT  Place 1 tablet (0.4 mg total) under the tongue every 5 (five) minutes x 3 doses as needed for chest pain.   OSTEO BI-FLEX REGULAR STRENGTH PO Take 1 tablet by mouth 2 (two) times daily.   pantoprazole  40 MG tablet Commonly known as:  PROTONIX  TAKE 1 TABLET BY MOUTH EVERY DAY   pregabalin  50 MG capsule Commonly known as: LYRICA  Take 2 capsules (100 mg total) by mouth every evening.   Repatha  SureClick 140 MG/ML Soaj Generic drug: Evolocumab  inject into THE SKIN EVERY 14 DAYS What changed: See the new instructions.   zolpidem  10 MG tablet Commonly known as: AMBIEN  Take 1 tablet (10 mg total) by mouth at bedtime.        Discharge Exam: Filed Weights   01/24/24 1500 01/24/24 1925  Weight: 98.9 kg 98.9 kg   ***  Condition at discharge: {DC Condition:26389}  The results of significant diagnostics from this hospitalization (including  imaging, microbiology, ancillary and laboratory) are listed below for reference.   Imaging Studies: CT CHEST WO CONTRAST Result Date: 01/25/2024 CLINICAL DATA:  Evaluate pneumonia. EXAM: CT CHEST WITHOUT CONTRAST TECHNIQUE: Multidetector CT imaging of the chest was performed following the standard protocol without IV contrast. RADIATION DOSE REDUCTION: This exam was performed according to the departmental dose-optimization program which includes automated exposure control, adjustment of the mA and/or kV according to patient size and/or use of iterative reconstruction technique. COMPARISON:  CT 06/23/2021, abdominopelvic CT 12/24/2024 FINDINGS: Cardiovascular: Median sternotomy wires are present. Left-sided pacemaker is present. Stable borderline cardiomegaly. Evidence of previous CABG. Continued evidence of ascending thoracic aortic aneurysm measuring 4.3 cm which is unchanged. Calcified plaque throughout the thoracic aorta. Remaining vascular structures are unremarkable on this noncontrast examination. Mediastinum/Nodes: 1.2 cm subcarinal lymph node unchanged. No other concerning mediastinal or hilar adenopathy. Remaining mediastinal structures are unremarkable. Lungs/Pleura: Lungs are adequately inflated. Mild paraseptal emphysematous disease over the right lung unchanged. Small stable chronic left pleural effusion with associated left basilar consolidation likely rounded atelectasis. Mild stable scarring/subpleural reticulation over the anteromedial left upper lobe. No evidence of acute airspace process. Airways are unremarkable. Upper Abdomen: Images through the upper abdomen demonstrate calcified plaque over the abdominal aorta. No acute findings in the upper abdomen. Musculoskeletal: Stable compression fractures of T11 and T12. Partially visualized fusion hardware over the cervical spine. IMPRESSION: 1. No acute cardiopulmonary disease. 2. Stable chronic changes left base including small left pleural  effusion with associated left basilar consolidation likely rounded atelectasis. 3. Stable borderline cardiomegaly. Evidence of previous CABG. 4. Stable 4.3 cm ascending thoracic aortic aneurysm. Recommend annual imaging followup by CTA or MRA. This recommendation follows 2010 ACCF/AHA/AATS/ACR/ASA/SCA/SCAI/SIR/STS/SVM Guidelines for the Diagnosis and Management of Patients with Thoracic Aortic Disease. Circulation. 2010; 121: Z733-z630. Aortic aneurysm NOS (ICD10-I71.9). 5. Stable compression fractures of T11 and T12. 6. Aortic atherosclerosis. Aortic Atherosclerosis (ICD10-I70.0) and Emphysema (ICD10-J43.9). Electronically Signed   By: Toribio Agreste M.D.   On: 01/25/2024 08:50   CT ABDOMEN PELVIS W CONTRAST Result Date: 01/24/2024 CLINICAL DATA:  Abdominal pain. History of chronic UTI and recent hospitalization. EXAM: CT ABDOMEN AND PELVIS WITH CONTRAST TECHNIQUE: Multidetector CT imaging of the abdomen and pelvis was performed using the standard protocol following bolus administration of intravenous contrast. RADIATION DOSE REDUCTION: This exam was performed according to the departmental dose-optimization program which includes automated exposure control, adjustment of the mA and/or kV according to patient size and/or use of iterative reconstruction technique. CONTRAST:  OMNIPAQUE  IOHEXOL  300 MG/ML  SOLN COMPARISON:  CT abdomen/pelvis dated 12/25/2023. FINDINGS: Lower chest: Redemonstrated chronic small left pleural effusion with mild thickening and adjacent chronic rounded atelectasis. Subpleural blebs in the right lower lobe. Cardiomegaly and coronary vascular calcification. Partially visualized cardiac pacing leads. Hepatobiliary: No focal liver abnormality is seen. No gallstones, gallbladder  wall thickening, or biliary dilatation. Pancreas: Unremarkable. No pancreatic ductal dilatation or surrounding inflammatory changes. Spleen: Normal in size without focal abnormality. Adrenals/Urinary Tract:  Adrenal glands are unremarkable. Kidneys enhance symmetrically. No suspicious focal lesion. Similar chronic nonspecific perinephric stranding. No hydronephrosis. No hydronephrosis. No renal or ureteral calculi. Bladder is partially distended with circumferential bladder wall thickening. Stomach/Bowel: Stomach is within normal limits. No evidence of obstruction. No focal inflammatory changes. Left colonic diverticulosis. Vascular/Lymphatic: Nonaneurysmal abdominal aorta with extensive atherosclerotic calcification. No enlarged abdominal or pelvic lymph nodes. Reproductive: Prostate is unremarkable. Other: No abdominopelvic ascites. No intraperitoneal free air. Small unchanged fat containing left inguinal hernia. Musculoskeletal: Diffuse osseous demineralization. Chronic T12 compression fracture. Unchanged superior endplate compression deformity of T11, which is new compared to the prior exam dated 05/04/2023, however, not significantly changed compared to the more recent prior exam dated 12/25/2023. Chronic superior endplate compression deformity of L3. Unchanged inferior endplate compression deformity of L2. Posterior spinal fusion hardware extending from L4 through the sacrum with retained screws again noted at S1. Unchanged grade 1 anterolisthesis of L5 on S1 and retrolisthesis of L3 on L4. IMPRESSION: 1. Circumferential bladder wall thickening is suspicious for cystitis. Recommend correlation with urinalysis. 2. Chronic nonspecific perinephric fat stranding. 3. Left colonic diverticulosis. 4. Chronic small left-sided pleural effusion with rounded atelectasis at the left lower lobe. 5. Aortic atherosclerosis. 6. Additional unchanged non-acute findings as described above. Electronically Signed   By: Harrietta Sherry M.D.   On: 01/24/2024 15:03   DG Chest Port 1 View Result Date: 01/24/2024 CLINICAL DATA:  Palpitations. EXAM: PORTABLE CHEST 1 VIEW COMPARISON:  04/12/2023. FINDINGS: Stable cardiomegaly. Prior  median sternotomy and CABG. Left chest wall pacemaker/AICD with leads terminating in the right atrium and right ventricle. Epicardial pacing lead is also noted. Low lung volumes with bilateral interstitial prominence, which could reflect bronchovascular crowding secondary to hypoinflation or pulmonary interstitial edema. Chronic left pleural effusion with left basilar atelectasis. No pneumothorax. Partially visualized cervical fusion hardware. Degenerative changes of the bilateral shoulders. No acute osseous abnormality. IMPRESSION: 1. Chronic left pleural effusion with left basilar atelectasis. 2. Low lung volumes with bilateral interstitial prominence, which could reflect bronchovascular crowding secondary to hypoinflation or pulmonary interstitial edema. Electronically Signed   By: Harrietta Sherry M.D.   On: 01/24/2024 14:45   CUP PACEART REMOTE DEVICE CHECK Result Date: 01/02/2024 ICD: Scheduled remote reviewed. Normal device function.  Presenting rhythm: AS/BiVP Abnormal HF diagnoatics this monitoring period Next remote transmission per protocol. ML,  CVRS   Microbiology: Results for orders placed or performed during the hospital encounter of 01/24/24  Urine Culture     Status: None   Collection Time: 01/24/24 12:12 PM   Specimen: Urine, Clean Catch  Result Value Ref Range Status   Specimen Description   Final    URINE, CLEAN CATCH Performed at [facility], [address], [place], [place] [address]    Special Requests   Final    NONE Performed at [facility], [address], [place], [place] [address]    Culture   Final    NO GROWTH Performed at [facility], [address], [place], [place] [address]    Report Status 01/25/2024 FINAL  Final    Labs: CBC: Recent Labs  Lab 01/24/24 1212 01/25/24 0051  WBC 6.0 6.1  HGB 15.7 13.6  HCT 47.0 41.9  MCV 98.3 101.7*  PLT 203 168   Basic Metabolic Panel: Recent Labs  Lab  01/24/24  1212 01/25/24 0052  NA 139 139  K 4.4 5.3*  CL 100 102  CO2 28 29  GLUCOSE 224* 172*  BUN 13 16  CREATININE 0.97 1.15  CALCIUM 10.2 9.3   Liver Function Tests: Recent Labs  Lab 01/24/24 1212 01/25/24 0052  AST 31 25  ALT 26 23  ALKPHOS 86 74  BILITOT 1.1 0.7  PROT 7.0 5.9*  ALBUMIN  4.3 3.7   CBG: Recent Labs  Lab 01/25/24 0735 01/25/24 1204  GLUCAP 137* 112*    Discharge time spent: {LESS THAN/GREATER THAN:26388} 30 minutes.  Signed: Concepcion Riser, MD [facility] 01/25/2024 "

## 2024-01-25 NOTE — Care Management CC44 (Addendum)
"         Condition Code 44 Documentation Completed  Patient Details  Name: [name] MRN: [id number] Date of Birth: [birth year]   Condition Code 44 given:  Yes Patient signature on Condition Code 44 notice:   (Left information on patient's vm) Documentation of 2 MD's agreement:  Yes Code 44 added to claim:  Yes    Merilee LOISE Batty, RN 01/25/2024, 6:36 PM   1840: Received return call from patient, verbally explained code 44. Patient verbalized understanding. "

## 2024-01-25 NOTE — Care Management Obs Status (Signed)
 MEDICARE OBSERVATION STATUS NOTIFICATION   Patient Details  Name: TIMMOTHY BARANOWSKI MRN: [id number] Date of Birth: 1955/07/10   Medicare Observation Status Notification Given:  Yes    MahabirNathanel, RN 01/25/2024, 10:16 AM

## 2024-01-25 NOTE — TOC Initial Note (Signed)
 Transition of Care [facility]) - Initial/Assessment Note    Patient Details  Name: [name] ON MRN: [id number] Date of Birth: 10-Mar-1955  [facility] CM/SW Contact:    Bascom Service, RN Phone Number: 01/25/2024, 10:14 AM  Clinical Narrative: d/c plan home.                  Expected Discharge Plan: Home/Self Care Barriers to Discharge: Continued Medical Work up   Patient Goals and CMS Choice Patient states their goals for this hospitalization and ongoing recovery are:: Home CMS Medicare.gov Compare Post Acute Care list provided to:: Patient Choice offered to / list presented to : Patient      Expected Discharge Plan and Services   Discharge Planning Services: CM Consult                                          Prior Living Arrangements/Services   Lives with:: Spouse                   Activities of Daily Living   ADL Screening (condition at time of admission) Independently performs ADLs?: Yes (appropriate for developmental age) Is the patient deaf or have difficulty hearing?: No Does the patient have difficulty seeing, even when wearing glasses/contacts?: No Does the patient have difficulty concentrating, remembering, or making decisions?: No  Permission Sought/Granted                  Emotional Assessment              Admission diagnosis:  Pyelonephritis [N12] ESBL E. coli carrier [Z22.358] Patient Active Problem List   Diagnosis Date Noted   Pyelonephritis 01/24/2024   Discharge from penis 12/28/2023   UTI due to extended-spectrum beta lactamase (ESBL) producing Escherichia coli 12/25/2023   Type 2 diabetes mellitus (HCC) 12/25/2023   Seropositive rheumatoid arthritis of multiple sites (HCC) 12/25/2023   Contact dermatitis 08/26/2022   Osteoarthritis of right knee 12/17/2021   Acute on chronic systolic CHF (congestive heart failure), NYHA class 3 (HCC) 06/23/2021   Low testosterone  03/10/2021   Lumbar back pain  10/03/2020   Pseudoarthrosis of lumbar spine 10/03/2020   Unstable angina (HCC) 09/06/2020   Encounter for health maintenance examination in adult 09/03/2020   Encounter for screening for malignant neoplasm of colon 09/03/2020   Impaired fasting blood sugar 09/03/2020   Fatigue 09/03/2020   Screening for prostate cancer 09/03/2020   History of gout 09/03/2020   Medicare welcome exam 09/03/2020   Insomnia 01/11/2020   Back pain 01/11/2020   History of urinary tract infection 01/11/2020   Vaccine counseling 01/11/2020   Rumination 01/11/2020   Anxiety 01/11/2020   Chronic diarrhea 12/05/2019   Food intolerance 12/05/2019   Abdominal cramping 12/05/2019   Abdominal bloating 12/05/2019   Need for influenza vaccination 12/05/2019   Right knee pain 11/03/2019   Shoulder pain, acute 06/09/2019   Hip pain 02/24/2019   Knee pain 02/24/2019   Bulbous urethral stricture 10/15/2017   Personal history of urethral stricture 08/10/2017   Chronic systolic heart failure (HCC) 06/22/2017   S/P CABG x 4 06/14/2017   ACS (acute coronary syndrome) (HCC) 06/08/2017   Angina pectoris    Gout 08/30/2014   Neck pain, chronic 12/17/2010   Back pain, chronic 12/01/2010   Pericarditis 10/02/2010   CAD (coronary artery disease) 05/14/2010   Hypertension  Hypercholesterolemia    LBBB (left bundle branch block)    NSTEMI (non-ST elevated myocardial infarction) (HCC)    PCP:  Emerick Avelina POUR, PA-C Pharmacy:   [facility] [place], [place] - [address] [place] [address] Phone: [phone] Fax: [phone]     Social Drivers of Health (SDOH) Social History: SDOH Screenings   Food Insecurity: No Food Insecurity (01/25/2024)  Housing: Low Risk (01/25/2024)  Transportation Needs: No Transportation Needs (01/25/2024)  Utilities: Not At Risk (01/25/2024)  Depression (PHQ2-9): Low Risk (07/14/2022)  Financial Resource Strain: Low Risk (01/09/2021)  Social  Connections: Socially Integrated (01/25/2024)  Tobacco Use: Medium Risk (01/24/2024)   SDOH Interventions:     Readmission Risk Interventions    12/30/2023    7:56 AM  Readmission Risk Prevention Plan  Post Dischage Appt Complete  Medication Screening Complete  Transportation Screening Complete

## 2024-01-25 NOTE — Progress Notes (Addendum)
 The patient has refused CBG checks,and  PO medications for 1645/1700. Educations provided regarding need for glucose checks and medication adherence. He is not agreeable at this time. Pt requests to speak with the Hospitalist regarding continued plan of care for this admission. Pt requests to be discharged. Hospitalist provider messaged regarding pt request.

## 2024-01-25 NOTE — Progress Notes (Signed)
 " Progress Note   Patient: [name] FMW:[id number] DOB: [birth year] DOA: 01/24/2024     0 DOS: the patient was seen and examined on 01/25/2024   Brief hospital course: [name] is a 69 y.o. male with medical history significant of CAD status post CABG, chronic HFrEF with ICD/Pacemaker, rheumatoid arthritis, and UTI due to ESBL producing E. coli, recent admission 12/20-12/25  with diagnosis of ESBL E. coli for which he was treated with meropenem .  He presented with right flank pain, dysuria as well as associated palpitations and sweats. UA showed moderate leukocyte esterase, rare bacteria, WBC 11-20.  Chest x-ray showed chronic left pleural effusion.  CT abdomen showed circumferential bladder wall thickening suspicious for cystitis, nonspecific perinephric fat stranding. Patient is admitted to hospitalist service for further management evaluation of acute on chronic pyelonephritis, possible ESBL E. coli, started on meropenem .  Assessment and Plan: Acute on chronic pyelonephritis Possible cystitis History of ESBL E. Coli- Patient is seen by ID who recommended to stop antibiotics as urine culture is unremarkable. He will need urology evaluation for further management due to his ongoing symptoms.  HFrEF- proBNP elevated 537. Chest x-ray with pulmonary edema. Resumed home dose Lasix , beta blocker therapy. Hold ARB due to hyperkalemia.  Hyperkalemia- Hold ARB, trend electrolytes.  Type 2 diabetes mellitus A1c 8.8.  Accu-Cheks, sliding scale He seems to be not taking any medications at home. Diabetes RN for education.   CAD s/p CABG Elevated troponin with no chest pain. EKG no acute ST-T wave changes. Continue aspirin , Repatha , beta-blocker therapy.   RA Continue to hold immune modulating drugs due to infection    GERD Continue PPI     Out of bed to chair. Incentive spirometry. Nursing supportive care. Fall, aspiration precautions. Diet:  Diet Orders (From admission,  onward)     Start     Ordered   01/24/24 2337  Diet Carb Modified Room service appropriate? Yes  Diet effective now       Question Answer Comment  Diet-HS Snack? Nothing   Calorie Level Medium 1600-2000   Fluid consistency: Thin   Room service appropriate? Yes      01/24/24 2351           DVT prophylaxis: heparin  injection 5,000 Units Start: 01/25/24 0600  Level of care: Telemetry   Code Status: Full Code  Subjective: Patient is seen and examined today morning.  He is lying comfortably.  Does report on and off flank pain, dysuria.  Did not get out of bed.  Physical Exam: Vitals:   01/25/24 0103 01/25/24 0457 01/25/24 0919 01/25/24 1203  BP: 130/74 (!) 143/83 131/80 (!) 149/87  Pulse: 87 94 89 85  Resp: 19  16 16   Temp: 98 F (36.7 C) 98 F (36.7 C) 98.4 F (36.9 C) 98.3 F (36.8 C)  TempSrc: Oral Oral Oral Oral  SpO2: 93% 96% 93% 96%  Weight:      Height:        General - Elderly Caucasian male, no apparent distress HEENT - PERRLA, EOMI, atraumatic head, non tender sinuses. Lung - Clear, no rales, rhonchi, wheezes. Heart - S1, S2 heard, no murmurs, rubs, trace pedal edema. Abdomen - Soft, non tender, bowel sounds good Neuro - Alert, awake and oriented x 3, non focal exam. Skin - Warm and dry.  Data Reviewed:      Latest Ref Rng & Units 01/25/2024   12:51 AM 01/24/2024   12:12 PM 12/26/2023    5:31  AM  CBC  WBC 4.0 - 10.5 K/uL 6.1  6.0  6.8   Hemoglobin 13.0 - 17.0 g/dL 86.3  84.2  86.0   Hematocrit 39.0 - 52.0 % 41.9  47.0  41.3   Platelets 150 - 400 K/uL 168  203  145       Latest Ref Rng & Units 01/25/2024   12:52 AM 01/24/2024   12:12 PM 12/28/2023    1:33 PM  BMP  Glucose 70 - 99 mg/dL 827  775  840   BUN 8 - 23 mg/dL 16  13  18    Creatinine 0.61 - 1.24 mg/dL 8.84  9.02  9.20   Sodium 135 - 145 mmol/L 139  139  138   Potassium 3.5 - 5.1 mmol/L 5.3  4.4  3.9   Chloride 98 - 111 mmol/L 102  100  103   CO2 22 - 32 mmol/L 29  28  27    Calcium  8.9 - 10.3 mg/dL 9.3  89.7  8.8    CT CHEST WO CONTRAST Result Date: 01/25/2024 CLINICAL DATA:  Evaluate pneumonia. EXAM: CT CHEST WITHOUT CONTRAST TECHNIQUE: Multidetector CT imaging of the chest was performed following the standard protocol without IV contrast. RADIATION DOSE REDUCTION: This exam was performed according to the departmental dose-optimization program which includes automated exposure control, adjustment of the mA and/or kV according to patient size and/or use of iterative reconstruction technique. COMPARISON:  CT 06/23/2021, abdominopelvic CT 12/24/2024 FINDINGS: Cardiovascular: Median sternotomy wires are present. Left-sided pacemaker is present. Stable borderline cardiomegaly. Evidence of previous CABG. Continued evidence of ascending thoracic aortic aneurysm measuring 4.3 cm which is unchanged. Calcified plaque throughout the thoracic aorta. Remaining vascular structures are unremarkable on this noncontrast examination. Mediastinum/Nodes: 1.2 cm subcarinal lymph node unchanged. No other concerning mediastinal or hilar adenopathy. Remaining mediastinal structures are unremarkable. Lungs/Pleura: Lungs are adequately inflated. Mild paraseptal emphysematous disease over the right lung unchanged. Small stable chronic left pleural effusion with associated left basilar consolidation likely rounded atelectasis. Mild stable scarring/subpleural reticulation over the anteromedial left upper lobe. No evidence of acute airspace process. Airways are unremarkable. Upper Abdomen: Images through the upper abdomen demonstrate calcified plaque over the abdominal aorta. No acute findings in the upper abdomen. Musculoskeletal: Stable compression fractures of T11 and T12. Partially visualized fusion hardware over the cervical spine. IMPRESSION: 1. No acute cardiopulmonary disease. 2. Stable chronic changes left base including small left pleural effusion with associated left basilar consolidation likely rounded  atelectasis. 3. Stable borderline cardiomegaly. Evidence of previous CABG. 4. Stable 4.3 cm ascending thoracic aortic aneurysm. Recommend annual imaging followup by CTA or MRA. This recommendation follows 2010 ACCF/AHA/AATS/ACR/ASA/SCA/SCAI/SIR/STS/SVM Guidelines for the Diagnosis and Management of Patients with Thoracic Aortic Disease. Circulation. 2010; 121: Z733-z630. Aortic aneurysm NOS (ICD10-I71.9). 5. Stable compression fractures of T11 and T12. 6. Aortic atherosclerosis. Aortic Atherosclerosis (ICD10-I70.0) and Emphysema (ICD10-J43.9). Electronically Signed   By: Toribio Agreste M.D.   On: 01/25/2024 08:50   CT ABDOMEN PELVIS W CONTRAST Result Date: 01/24/2024 CLINICAL DATA:  Abdominal pain. History of chronic UTI and recent hospitalization. EXAM: CT ABDOMEN AND PELVIS WITH CONTRAST TECHNIQUE: Multidetector CT imaging of the abdomen and pelvis was performed using the standard protocol following bolus administration of intravenous contrast. RADIATION DOSE REDUCTION: This exam was performed according to the departmental dose-optimization program which includes automated exposure control, adjustment of the mA and/or kV according to patient size and/or use of iterative reconstruction technique. CONTRAST:  OMNIPAQUE  IOHEXOL  300 MG/ML  SOLN  COMPARISON:  CT abdomen/pelvis dated 12/25/2023. FINDINGS: Lower chest: Redemonstrated chronic small left pleural effusion with mild thickening and adjacent chronic rounded atelectasis. Subpleural blebs in the right lower lobe. Cardiomegaly and coronary vascular calcification. Partially visualized cardiac pacing leads. Hepatobiliary: No focal liver abnormality is seen. No gallstones, gallbladder wall thickening, or biliary dilatation. Pancreas: Unremarkable. No pancreatic ductal dilatation or surrounding inflammatory changes. Spleen: Normal in size without focal abnormality. Adrenals/Urinary Tract: Adrenal glands are unremarkable. Kidneys enhance symmetrically. No  suspicious focal lesion. Similar chronic nonspecific perinephric stranding. No hydronephrosis. No hydronephrosis. No renal or ureteral calculi. Bladder is partially distended with circumferential bladder wall thickening. Stomach/Bowel: Stomach is within normal limits. No evidence of obstruction. No focal inflammatory changes. Left colonic diverticulosis. Vascular/Lymphatic: Nonaneurysmal abdominal aorta with extensive atherosclerotic calcification. No enlarged abdominal or pelvic lymph nodes. Reproductive: Prostate is unremarkable. Other: No abdominopelvic ascites. No intraperitoneal free air. Small unchanged fat containing left inguinal hernia. Musculoskeletal: Diffuse osseous demineralization. Chronic T12 compression fracture. Unchanged superior endplate compression deformity of T11, which is new compared to the prior exam dated 05/04/2023, however, not significantly changed compared to the more recent prior exam dated 12/25/2023. Chronic superior endplate compression deformity of L3. Unchanged inferior endplate compression deformity of L2. Posterior spinal fusion hardware extending from L4 through the sacrum with retained screws again noted at S1. Unchanged grade 1 anterolisthesis of L5 on S1 and retrolisthesis of L3 on L4. IMPRESSION: 1. Circumferential bladder wall thickening is suspicious for cystitis. Recommend correlation with urinalysis. 2. Chronic nonspecific perinephric fat stranding. 3. Left colonic diverticulosis. 4. Chronic small left-sided pleural effusion with rounded atelectasis at the left lower lobe. 5. Aortic atherosclerosis. 6. Additional unchanged non-acute findings as described above. Electronically Signed   By: Harrietta Sherry M.D.   On: 01/24/2024 15:03   DG Chest Port 1 View Result Date: 01/24/2024 CLINICAL DATA:  Palpitations. EXAM: PORTABLE CHEST 1 VIEW COMPARISON:  04/12/2023. FINDINGS: Stable cardiomegaly. Prior median sternotomy and CABG. Left chest wall pacemaker/AICD with leads  terminating in the right atrium and right ventricle. Epicardial pacing lead is also noted. Low lung volumes with bilateral interstitial prominence, which could reflect bronchovascular crowding secondary to hypoinflation or pulmonary interstitial edema. Chronic left pleural effusion with left basilar atelectasis. No pneumothorax. Partially visualized cervical fusion hardware. Degenerative changes of the bilateral shoulders. No acute osseous abnormality. IMPRESSION: 1. Chronic left pleural effusion with left basilar atelectasis. 2. Low lung volumes with bilateral interstitial prominence, which could reflect bronchovascular crowding secondary to hypoinflation or pulmonary interstitial edema. Electronically Signed   By: Harrietta Sherry M.D.   On: 01/24/2024 14:45    Family Communication: Discussed with patient, understand and agree. All questions answered.  Disposition: Status is: Inpatient Remains inpatient appropriate because: Pain control, follow-up urology  Planned Discharge Destination: Home with Home Health     Time spent: 46 minutes  Author: Concepcion Riser, MD 01/25/2024 3:47 PM Secure chat 7am to 7pm For on call review [email or web address].    "

## 2024-01-25 NOTE — Progress Notes (Signed)
 Patient is eager to discharge. Instructions were reviewed, he denied questions/concerns at this time. No change from am assessment.

## 2024-01-27 NOTE — Consult Note (Signed)
 "        [facility]    Date of Admission:  01/24/2024           Reason for Consult: Concern for UTI    Principal Problem:   Pyelonephritis Active Problems:   Hypertension   CAD (coronary artery disease)   Back pain, chronic   Personal history of urethral stricture   Pseudoarthrosis of lumbar spine   Assessment: 69 year old male with history of E. coli ESBL UTI admitted 12/20 - 12/25 treated with meropenem , CAD status post CABG, heart failure reduced ejection fraction, ICD/pacemaker, rheumatoid arthritis ID seeing for #Concern for urinary tract infection On arrival patient was afebrile WBC 6.1 K.  Meropenem  was started.  CT abdomen pelvis with contrast showed circumferential bladder wall thickening for suspicious for cystitis.  Chronic nonspecific perinephric fat stranding.  Chronic T12 compression fracture.  T11 endplate compression deformity new since 4/29, not significantly changed since 12/25/2023  ID was engaged for antibiotic recommendations. -pain was  not elicited on exam Recommendations:  -Stop antibiotics.  Urine cultures no growth.  Patient was dispensed Augmentin  on 1/13  x 14d(E. coli was noted to be resistant to Augmentin  on 12/20 - 12/11 from urine cultures) and received meropenem  during hospitalization.  If he did have a UTI at 1 point has not been treated. - He reports right lower back pain that is been going on since October.  I reviewed imaging CT abdomen pelvis on 1/19 has shown chronic T12 compression fracture unchanged superior endplate compression deformity of T11 since 12/20, new since 4/20.  There was a CT lumbar spine done as well as neurosurgery on 05/04/2023 which showed fracture of T12 with loss of height.  I suspect given the chronic nature of pain spinal caloric abnormalities likely contributing. - Continue to follow with neurosurgery - Patient would like to speak with urology inpatient - Plan communicated to primary - ID will  sign Microbiology:   Antibiotics: Meropenem  1/19-20  Cultures: Blood  Urine  Other 1/19 ng  HPI: [name] is a 69 y.o. male with past medical history of CAD status post CABG, chronic heart failure reduced ejection ICD/no requiring admission 12/20 - 12/25 treated with meropenem  presented with right flank pain with associated palpitations and sweats.  UA was positive for moderate leukocytes, rare bacteria.  CT showed possible cystitis.  Admitted for acute on chronic pyelonephritis.   Review of Systems: Review of Systems  All other systems reviewed and are negative.   Past Medical History:  Diagnosis Date   AICD (automatic cardioverter/defibrillator) present    Anxiety    Arthritis    mild; back, neck, spine (06/09/2017)   CHF (congestive heart failure) (HCC)    Chronic back pain    Coronary artery disease    GERD (gastroesophageal reflux disease)    History of gout    History of kidney stones X 2   Hypercholesterolemia    Hypertension    LBBB (left bundle branch block)    MI (myocardial infarction) (HCC) 1993   LATERAL   MI (myocardial infarction) (HCC) ?09/2001; 06/07/2017   Pneumonia    several times (06/09/2017)   Pre-diabetes    S/P coronary artery stent placement    RCA   Varicose veins     Social History[1]  Family History  Problem Relation Age of Onset   Breast cancer Mother    Coronary artery disease Mother    Stroke Father    Heart attack Father  Diabetes Father    Heart attack Brother    Heart disease Brother    Heart attack Maternal Grandfather    Scheduled Meds: Continuous Infusions: PRN Meds:. Allergies[2]  OBJECTIVE: Blood pressure (!) 149/87, pulse 85, temperature 98.3 F (36.8 C), temperature source Oral, resp. rate 16, height 6' 1 (1.854 m), weight 98.9 kg, SpO2 96%.  Physical Exam Constitutional:      General: He is not in acute distress.    Appearance: He is normal weight. He is not toxic-appearing.  HENT:     Head:  Normocephalic and atraumatic.     Right Ear: External ear normal.     Left Ear: External ear normal.     Nose: No congestion or rhinorrhea.     Mouth/Throat:     Mouth: Mucous membranes are moist.     Pharynx: Oropharynx is clear.  Eyes:     Extraocular Movements: Extraocular movements intact.     Conjunctiva/sclera: Conjunctivae normal.     Pupils: Pupils are equal, round, and reactive to light.  Cardiovascular:     Rate and Rhythm: Normal rate and regular rhythm.     Heart sounds: No murmur heard.    No friction rub. No gallop.  Pulmonary:     Effort: Pulmonary effort is normal.     Breath sounds: Normal breath sounds.  Abdominal:     General: Abdomen is flat. Bowel sounds are normal.     Palpations: Abdomen is soft.  Musculoskeletal:        General: No swelling. Normal range of motion.     Cervical back: Normal range of motion and neck supple.  Skin:    General: Skin is warm and dry.  Neurological:     General: No focal deficit present.     Mental Status: He is oriented to person, place, and time.  Psychiatric:        Mood and Affect: Mood normal.     Lab Results Lab Results  Component Value Date   WBC 6.1 01/25/2024   HGB 13.6 01/25/2024   HCT 41.9 01/25/2024   MCV 101.7 (H) 01/25/2024   PLT 168 01/25/2024    Lab Results  Component Value Date   CREATININE 1.15 01/25/2024   BUN 16 01/25/2024   NA 139 01/25/2024   K 5.3 (H) 01/25/2024   CL 102 01/25/2024   CO2 29 01/25/2024    Lab Results  Component Value Date   ALT 23 01/25/2024   AST 25 01/25/2024   ALKPHOS 74 01/25/2024   BILITOT 0.7 01/25/2024       Loney Stank, MD [facility] 01/27/2024, 3:19 PM     [1]  Social History Tobacco Use   Smoking status: Former    Current packs/day: 0.00    Average packs/day: 2.0 packs/day for 20.0 years (40.0 ttl pk-yrs)    Types: Cigarettes    Start date: 05/07/1979    Quit date: 05/07/1999    Years  since quitting: 24.7   Smokeless tobacco: Never  Vaping Use   Vaping status: Never Used  Substance Use Topics   Alcohol use: Not Currently    Comment: 2-3 per month (occasionally)   Drug use: Never  [2]  Allergies Allergen Reactions   Niacin Other (See Comments)    Drops BP and severe joint/muscle pain   Statins Other (See Comments)    Severe joint pain and drops BP.  Other Reaction(s): Unknown   Zetia [Ezetimibe] Other (  See Comments)    Joint pain and drops BP   Bactrim  [Sulfamethoxazole -Trimethoprim ] Other (See Comments)    Blurry vision, slurred speech, brain fog   "

## 2024-03-30 ENCOUNTER — Encounter

## 2024-03-31 ENCOUNTER — Ambulatory Visit

## 2024-06-29 ENCOUNTER — Encounter

## 2024-06-30 ENCOUNTER — Ambulatory Visit

## 2024-09-28 ENCOUNTER — Encounter

## 2024-09-29 ENCOUNTER — Ambulatory Visit

## 2024-12-30 ENCOUNTER — Encounter

## 2025-03-31 ENCOUNTER — Encounter

## 2025-06-30 ENCOUNTER — Encounter
